# Patient Record
Sex: Female | Born: 1945 | Race: White | Hispanic: No | Smoking: Never smoker
Health system: Southern US, Community
[De-identification: ages and names within clinical notes are randomized; demographics above are authoritative.]

## PROBLEM LIST (undated history)

## (undated) DIAGNOSIS — I4891 Unspecified atrial fibrillation: Secondary | ICD-10-CM

## (undated) DIAGNOSIS — E785 Hyperlipidemia, unspecified: Secondary | ICD-10-CM

## (undated) DIAGNOSIS — I509 Heart failure, unspecified: Secondary | ICD-10-CM

## (undated) DIAGNOSIS — F419 Anxiety disorder, unspecified: Secondary | ICD-10-CM

## (undated) DIAGNOSIS — F1090 Alcohol use, unspecified, uncomplicated: Secondary | ICD-10-CM

## (undated) DIAGNOSIS — I5032 Chronic diastolic (congestive) heart failure: Secondary | ICD-10-CM

## (undated) DIAGNOSIS — K219 Gastro-esophageal reflux disease without esophagitis: Secondary | ICD-10-CM

## (undated) DIAGNOSIS — F32A Depression, unspecified: Secondary | ICD-10-CM

## (undated) DIAGNOSIS — D649 Anemia, unspecified: Secondary | ICD-10-CM

## (undated) DIAGNOSIS — E039 Hypothyroidism, unspecified: Secondary | ICD-10-CM

## (undated) DIAGNOSIS — N189 Chronic kidney disease, unspecified: Secondary | ICD-10-CM

## (undated) DIAGNOSIS — Z789 Other specified health status: Secondary | ICD-10-CM

## (undated) DIAGNOSIS — K7460 Unspecified cirrhosis of liver: Secondary | ICD-10-CM

## (undated) DIAGNOSIS — Z950 Presence of cardiac pacemaker: Secondary | ICD-10-CM

## (undated) DIAGNOSIS — R011 Cardiac murmur, unspecified: Secondary | ICD-10-CM

## (undated) DIAGNOSIS — D472 Monoclonal gammopathy: Secondary | ICD-10-CM

## (undated) DIAGNOSIS — E538 Deficiency of other specified B group vitamins: Secondary | ICD-10-CM

## (undated) DIAGNOSIS — R51 Headache: Secondary | ICD-10-CM

## (undated) DIAGNOSIS — D696 Thrombocytopenia, unspecified: Secondary | ICD-10-CM

## (undated) DIAGNOSIS — R519 Headache, unspecified: Secondary | ICD-10-CM

## (undated) DIAGNOSIS — Z803 Family history of malignant neoplasm of breast: Secondary | ICD-10-CM

## (undated) DIAGNOSIS — D509 Iron deficiency anemia, unspecified: Secondary | ICD-10-CM

## (undated) DIAGNOSIS — Z800 Family history of malignant neoplasm of digestive organs: Secondary | ICD-10-CM

## (undated) DIAGNOSIS — N183 Chronic kidney disease, stage 3 unspecified: Secondary | ICD-10-CM

## (undated) DIAGNOSIS — C50919 Malignant neoplasm of unspecified site of unspecified female breast: Secondary | ICD-10-CM

## (undated) DIAGNOSIS — Z807 Family history of other malignant neoplasms of lymphoid, hematopoietic and related tissues: Secondary | ICD-10-CM

## (undated) DIAGNOSIS — IMO0001 Reserved for inherently not codable concepts without codable children: Secondary | ICD-10-CM

## (undated) DIAGNOSIS — Z7289 Other problems related to lifestyle: Secondary | ICD-10-CM

## (undated) DIAGNOSIS — F0390 Unspecified dementia without behavioral disturbance: Secondary | ICD-10-CM

## (undated) DIAGNOSIS — J449 Chronic obstructive pulmonary disease, unspecified: Secondary | ICD-10-CM

## (undated) DIAGNOSIS — I10 Essential (primary) hypertension: Secondary | ICD-10-CM

## (undated) DIAGNOSIS — Z923 Personal history of irradiation: Secondary | ICD-10-CM

## (undated) DIAGNOSIS — E559 Vitamin D deficiency, unspecified: Secondary | ICD-10-CM

## (undated) DIAGNOSIS — F329 Major depressive disorder, single episode, unspecified: Secondary | ICD-10-CM

## (undated) HISTORY — DX: Unspecified dementia, unspecified severity, without behavioral disturbance, psychotic disturbance, mood disturbance, and anxiety: F03.90

## (undated) HISTORY — DX: Family history of malignant neoplasm of breast: Z80.3

## (undated) HISTORY — PX: CARDIAC VALVE REPLACEMENT: SHX585

## (undated) HISTORY — PX: PACEMAKER PLACEMENT: SHX43

## (undated) HISTORY — DX: Other specified health status: Z78.9

## (undated) HISTORY — DX: Family history of malignant neoplasm of digestive organs: Z80.0

## (undated) HISTORY — DX: Hyperlipidemia, unspecified: E78.5

## (undated) HISTORY — PX: VSD REPAIR: SHX276

## (undated) HISTORY — PX: MASTECTOMY: SHX3

## (undated) HISTORY — DX: Monoclonal gammopathy: D47.2

## (undated) HISTORY — PX: OTHER SURGICAL HISTORY: SHX169

## (undated) HISTORY — PX: ABLATION: SHX5711

## (undated) HISTORY — DX: Family history of other malignant neoplasms of lymphoid, hematopoietic and related tissues: Z80.7

## (undated) HISTORY — DX: Other problems related to lifestyle: Z72.89

## (undated) HISTORY — PX: BREAST SURGERY: SHX581

## (undated) HISTORY — DX: Malignant neoplasm of unspecified site of unspecified female breast: C50.919

## (undated) HISTORY — PX: CARDIAC SURGERY: SHX584

## (undated) HISTORY — DX: Iron deficiency anemia, unspecified: D50.9

## (undated) HISTORY — DX: Essential (primary) hypertension: I10

## (undated) HISTORY — PX: MITRAL VALVE REPLACEMENT: SHX147

## (undated) HISTORY — DX: Unspecified atrial fibrillation: I48.91

## (undated) HISTORY — PX: ABDOMINAL HYSTERECTOMY: SHX81

## (undated) HISTORY — DX: Alcohol use, unspecified, uncomplicated: F10.90

## (undated) HISTORY — PX: EYE SURGERY: SHX253

## (undated) HISTORY — PX: APPENDECTOMY: SHX54

---

## 2004-08-07 ENCOUNTER — Ambulatory Visit: Payer: Self-pay | Admitting: General Practice

## 2004-08-12 ENCOUNTER — Ambulatory Visit: Payer: Self-pay | Admitting: Internal Medicine

## 2004-08-21 ENCOUNTER — Ambulatory Visit: Payer: Self-pay | Admitting: Internal Medicine

## 2004-11-11 ENCOUNTER — Ambulatory Visit: Payer: Self-pay | Admitting: Internal Medicine

## 2004-11-19 ENCOUNTER — Ambulatory Visit: Payer: Self-pay | Admitting: Internal Medicine

## 2005-02-10 ENCOUNTER — Ambulatory Visit: Payer: Self-pay | Admitting: Internal Medicine

## 2005-02-19 ENCOUNTER — Ambulatory Visit: Payer: Self-pay | Admitting: Internal Medicine

## 2005-05-12 ENCOUNTER — Ambulatory Visit: Payer: Self-pay | Admitting: Internal Medicine

## 2005-05-22 ENCOUNTER — Ambulatory Visit: Payer: Self-pay | Admitting: Internal Medicine

## 2005-08-11 ENCOUNTER — Ambulatory Visit: Payer: Self-pay | Admitting: Internal Medicine

## 2005-08-18 ENCOUNTER — Ambulatory Visit: Payer: Self-pay | Admitting: General Practice

## 2005-08-21 ENCOUNTER — Ambulatory Visit: Payer: Self-pay | Admitting: Internal Medicine

## 2005-09-21 ENCOUNTER — Ambulatory Visit: Payer: Self-pay | Admitting: Internal Medicine

## 2005-10-22 ENCOUNTER — Ambulatory Visit: Payer: Self-pay | Admitting: Internal Medicine

## 2006-01-08 ENCOUNTER — Ambulatory Visit: Payer: Self-pay | Admitting: Internal Medicine

## 2006-01-19 ENCOUNTER — Ambulatory Visit: Payer: Self-pay | Admitting: Internal Medicine

## 2006-02-19 ENCOUNTER — Ambulatory Visit: Payer: Self-pay | Admitting: Internal Medicine

## 2006-03-21 ENCOUNTER — Ambulatory Visit: Payer: Self-pay | Admitting: Internal Medicine

## 2006-05-21 ENCOUNTER — Ambulatory Visit: Payer: Self-pay | Admitting: Internal Medicine

## 2006-07-21 ENCOUNTER — Ambulatory Visit: Payer: Self-pay | Admitting: Internal Medicine

## 2006-08-31 ENCOUNTER — Ambulatory Visit: Payer: Self-pay | Admitting: General Practice

## 2006-09-29 ENCOUNTER — Ambulatory Visit: Payer: Self-pay | Admitting: Internal Medicine

## 2006-10-22 ENCOUNTER — Ambulatory Visit: Payer: Self-pay | Admitting: Internal Medicine

## 2006-11-20 ENCOUNTER — Ambulatory Visit: Payer: Self-pay | Admitting: Internal Medicine

## 2007-02-16 ENCOUNTER — Ambulatory Visit: Payer: Self-pay | Admitting: Internal Medicine

## 2007-02-20 ENCOUNTER — Ambulatory Visit: Payer: Self-pay | Admitting: Internal Medicine

## 2007-05-18 ENCOUNTER — Ambulatory Visit: Payer: Self-pay | Admitting: Internal Medicine

## 2007-05-23 ENCOUNTER — Ambulatory Visit: Payer: Self-pay | Admitting: Internal Medicine

## 2007-08-09 ENCOUNTER — Ambulatory Visit: Payer: Self-pay | Admitting: Internal Medicine

## 2007-08-22 ENCOUNTER — Ambulatory Visit: Payer: Self-pay | Admitting: Internal Medicine

## 2007-10-23 ENCOUNTER — Ambulatory Visit: Payer: Self-pay | Admitting: Internal Medicine

## 2007-11-16 ENCOUNTER — Ambulatory Visit: Payer: Self-pay | Admitting: Internal Medicine

## 2007-11-20 ENCOUNTER — Ambulatory Visit: Payer: Self-pay | Admitting: Internal Medicine

## 2007-12-05 ENCOUNTER — Ambulatory Visit: Payer: Self-pay | Admitting: Internal Medicine

## 2008-02-08 ENCOUNTER — Ambulatory Visit: Payer: Self-pay | Admitting: Internal Medicine

## 2008-02-20 ENCOUNTER — Ambulatory Visit: Payer: Self-pay | Admitting: Internal Medicine

## 2008-05-09 ENCOUNTER — Ambulatory Visit: Payer: Self-pay | Admitting: Internal Medicine

## 2008-05-22 ENCOUNTER — Ambulatory Visit: Payer: Self-pay | Admitting: Internal Medicine

## 2008-06-18 ENCOUNTER — Ambulatory Visit: Payer: Self-pay | Admitting: Internal Medicine

## 2008-08-08 ENCOUNTER — Ambulatory Visit: Payer: Self-pay | Admitting: Internal Medicine

## 2008-08-21 ENCOUNTER — Ambulatory Visit: Payer: Self-pay | Admitting: Internal Medicine

## 2008-10-22 ENCOUNTER — Ambulatory Visit: Payer: Self-pay | Admitting: Internal Medicine

## 2008-11-14 ENCOUNTER — Ambulatory Visit: Payer: Self-pay | Admitting: Internal Medicine

## 2008-11-19 ENCOUNTER — Ambulatory Visit: Payer: Self-pay | Admitting: Internal Medicine

## 2008-12-06 ENCOUNTER — Ambulatory Visit: Payer: Self-pay | Admitting: Internal Medicine

## 2009-02-06 ENCOUNTER — Ambulatory Visit: Payer: Self-pay | Admitting: Internal Medicine

## 2009-02-19 ENCOUNTER — Ambulatory Visit: Payer: Self-pay | Admitting: Internal Medicine

## 2009-05-01 ENCOUNTER — Ambulatory Visit: Payer: Self-pay | Admitting: Internal Medicine

## 2009-05-22 ENCOUNTER — Ambulatory Visit: Payer: Self-pay | Admitting: Internal Medicine

## 2009-07-31 ENCOUNTER — Ambulatory Visit: Payer: Self-pay | Admitting: Internal Medicine

## 2009-08-21 ENCOUNTER — Ambulatory Visit: Payer: Self-pay | Admitting: Internal Medicine

## 2009-09-21 ENCOUNTER — Ambulatory Visit: Payer: Self-pay | Admitting: Internal Medicine

## 2009-10-22 ENCOUNTER — Ambulatory Visit: Payer: Self-pay | Admitting: Internal Medicine

## 2009-11-13 ENCOUNTER — Ambulatory Visit: Payer: Self-pay | Admitting: Internal Medicine

## 2009-11-19 ENCOUNTER — Ambulatory Visit: Payer: Self-pay | Admitting: Internal Medicine

## 2009-12-19 ENCOUNTER — Ambulatory Visit: Payer: Self-pay | Admitting: Internal Medicine

## 2010-02-13 ENCOUNTER — Ambulatory Visit: Payer: Self-pay | Admitting: Internal Medicine

## 2010-02-19 ENCOUNTER — Ambulatory Visit: Payer: Self-pay | Admitting: Internal Medicine

## 2010-04-21 ENCOUNTER — Ambulatory Visit: Payer: Self-pay | Admitting: Internal Medicine

## 2010-05-14 ENCOUNTER — Ambulatory Visit: Payer: Self-pay | Admitting: Internal Medicine

## 2010-05-22 ENCOUNTER — Ambulatory Visit: Payer: Self-pay | Admitting: Internal Medicine

## 2010-08-13 ENCOUNTER — Ambulatory Visit: Payer: Self-pay | Admitting: Internal Medicine

## 2010-08-21 ENCOUNTER — Ambulatory Visit: Payer: Self-pay | Admitting: Internal Medicine

## 2010-09-21 HISTORY — PX: BREAST LUMPECTOMY: SHX2

## 2010-09-21 HISTORY — PX: ESOPHAGOGASTRODUODENOSCOPY: SHX1529

## 2010-09-21 HISTORY — PX: COLONOSCOPY: SHX174

## 2010-09-21 HISTORY — PX: BREAST BIOPSY: SHX20

## 2010-11-11 ENCOUNTER — Ambulatory Visit: Payer: Self-pay | Admitting: Internal Medicine

## 2010-11-20 ENCOUNTER — Ambulatory Visit: Payer: Self-pay | Admitting: Internal Medicine

## 2010-12-21 ENCOUNTER — Ambulatory Visit: Payer: Self-pay | Admitting: Internal Medicine

## 2010-12-31 ENCOUNTER — Ambulatory Visit: Payer: Self-pay | Admitting: Internal Medicine

## 2011-01-02 ENCOUNTER — Ambulatory Visit: Payer: Self-pay | Admitting: Internal Medicine

## 2011-01-15 ENCOUNTER — Ambulatory Visit: Payer: Self-pay | Admitting: Surgery

## 2011-01-20 ENCOUNTER — Ambulatory Visit: Payer: Self-pay | Admitting: Internal Medicine

## 2011-01-27 ENCOUNTER — Inpatient Hospital Stay: Payer: Self-pay | Admitting: Internal Medicine

## 2011-02-04 ENCOUNTER — Ambulatory Visit: Payer: Self-pay | Admitting: Gastroenterology

## 2011-02-06 LAB — PATHOLOGY REPORT

## 2011-02-10 ENCOUNTER — Ambulatory Visit: Payer: Self-pay | Admitting: Surgery

## 2011-02-18 ENCOUNTER — Ambulatory Visit: Payer: Self-pay | Admitting: Surgery

## 2011-02-20 ENCOUNTER — Ambulatory Visit: Payer: Self-pay | Admitting: Internal Medicine

## 2011-03-22 ENCOUNTER — Ambulatory Visit: Payer: Self-pay | Admitting: Internal Medicine

## 2011-04-16 LAB — CANCER ANTIGEN 19-9: CA 19-9: 8 U/mL (ref 0–35)

## 2011-04-16 LAB — CANCER ANTIGEN 27.29: CA 27.29: 26.5 U/mL (ref 0.0–38.6)

## 2011-04-17 LAB — CEA: CEA: 1.3 ng/mL (ref 0.0–4.7)

## 2011-04-18 LAB — CEA: CEA: 1 ng/mL (ref 0.0–4.7)

## 2011-04-20 LAB — KAPPA/LAMBDA FREE LIGHT CHAINS (ARMC)

## 2011-04-20 LAB — PROT IMMUNOELECTROPHORES(ARMC)

## 2011-04-22 ENCOUNTER — Ambulatory Visit: Payer: Self-pay | Admitting: Internal Medicine

## 2011-04-22 ENCOUNTER — Telehealth: Payer: Self-pay | Admitting: Gastroenterology

## 2011-04-22 NOTE — Telephone Encounter (Signed)
Advised the cancer center that Dr Christella Hartigan is still reviewing the info.

## 2011-04-27 ENCOUNTER — Telehealth: Payer: Self-pay

## 2011-04-27 DIAGNOSIS — R933 Abnormal findings on diagnostic imaging of other parts of digestive tract: Secondary | ICD-10-CM

## 2011-04-27 NOTE — Telephone Encounter (Addendum)
Left message on machine to call back  Need to review meds and instruct pt she needs to go to [facility] and -pre register

## 2011-04-28 ENCOUNTER — Telehealth: Payer: Self-pay | Admitting: Gastroenterology

## 2011-04-28 NOTE — Telephone Encounter (Signed)
Informed pt she needs to pre register for her procedure on 04/30/11 at 2pm. Edsel Petrin in pre registration, [phone] who called the pt and registered her. Went over pt's med list; she isn't on any blood thinners and denies being a diabetic. Meds: diltiazem, denlafaxine, vit d, metoprolol, centrum and calcium. Pt instructed on prep with times for Mag. Citrate and Fleet's enema; she was instructed on a clear liquid diet as well. Pt will call for further questions.

## 2011-04-28 NOTE — Telephone Encounter (Signed)
Instructed pt she needs to pre register for her EUS on 04/30/11. Nicki Guadalajara gave me the number of [facility] in Pre Registration, [phone], and she will call the pt and register her. Pt needs to call Nicki Guadalajara on 04/29/11 between 1-3pm to find out when to come in Thursday for her procedure.

## 2011-04-29 ENCOUNTER — Telehealth: Payer: Self-pay | Admitting: Gastroenterology

## 2011-04-29 NOTE — Telephone Encounter (Signed)
Pt asked when to take the Mag. Citrate and the enema. Pt's procedure is at 2pm tomorrow. Instructed her to take the Mag. Citrate at 7pm tonight followed by at least 3 glasses of water. She needs to use the enema tomorrow 1.5 to 2 hours prior to her procedure since she lives 30 minutes away. Reminded her to drink until noon tomorrow and to keep herself hydrated. Pt stated understanding.

## 2011-04-30 ENCOUNTER — Encounter: Payer: Self-pay | Admitting: Gastroenterology

## 2011-04-30 ENCOUNTER — Ambulatory Visit: Payer: Self-pay

## 2011-04-30 ENCOUNTER — Other Ambulatory Visit: Payer: Self-pay | Admitting: Gastroenterology

## 2011-04-30 DIAGNOSIS — R933 Abnormal findings on diagnostic imaging of other parts of digestive tract: Secondary | ICD-10-CM

## 2011-05-05 LAB — PATHOLOGY REPORT

## 2011-05-11 ENCOUNTER — Encounter: Payer: Self-pay | Admitting: Gastroenterology

## 2011-05-11 ENCOUNTER — Ambulatory Visit: Payer: Self-pay | Admitting: Internal Medicine

## 2011-05-12 ENCOUNTER — Encounter: Payer: Self-pay | Admitting: Gastroenterology

## 2011-05-12 LAB — CEA: CEA: 1 ng/mL (ref 0.0–4.7)

## 2011-05-12 LAB — CA 125: CA 125: 35.2 U/mL — ABNORMAL HIGH (ref 0.0–34.0)

## 2011-05-13 LAB — CA 125: CA 125: 33.9 U/mL (ref 0.0–34.0)

## 2011-05-23 ENCOUNTER — Ambulatory Visit: Payer: Self-pay | Admitting: Internal Medicine

## 2011-05-31 ENCOUNTER — Ambulatory Visit: Payer: Self-pay | Admitting: Family Medicine

## 2011-06-22 ENCOUNTER — Ambulatory Visit: Payer: Self-pay | Admitting: Internal Medicine

## 2011-06-24 ENCOUNTER — Ambulatory Visit: Payer: Self-pay | Admitting: Gastroenterology

## 2011-06-29 LAB — PATHOLOGY REPORT

## 2011-07-23 ENCOUNTER — Ambulatory Visit: Payer: Self-pay | Admitting: Internal Medicine

## 2011-08-12 ENCOUNTER — Ambulatory Visit: Payer: Self-pay | Admitting: Surgery

## 2011-08-22 ENCOUNTER — Ambulatory Visit: Payer: Self-pay | Admitting: Internal Medicine

## 2011-09-22 ENCOUNTER — Ambulatory Visit: Payer: Self-pay | Admitting: Internal Medicine

## 2011-09-29 LAB — CBC CANCER CENTER
Basophil #: 0.2 x10 3/mm — ABNORMAL HIGH (ref 0.0–0.1)
Basophil %: 1.2 %
Eosinophil #: 0.1 x10 3/mm (ref 0.0–0.7)
HCT: 38.7 % (ref 35.0–47.0)
HGB: 13.1 g/dL (ref 12.0–16.0)
Lymphocyte #: 1 x10 3/mm (ref 1.0–3.6)
Lymphocyte %: 5.8 %
MCHC: 33.8 g/dL (ref 32.0–36.0)
MCV: 91.7 fL (ref 80–100)
Monocyte #: 0.9 x10 3/mm — ABNORMAL HIGH (ref 0.0–0.7)
Neutrophil #: 14.2 x10 3/mm — ABNORMAL HIGH (ref 1.4–6.5)
Platelet: 497 x10 3/mm — ABNORMAL HIGH (ref 150–440)
RBC: 4.22 10*6/uL (ref 3.80–5.20)
RDW: 16.3 % — ABNORMAL HIGH (ref 11.5–14.5)
WBC: 16.4 x10 3/mm — ABNORMAL HIGH (ref 3.6–11.0)

## 2011-09-29 LAB — HEPATIC FUNCTION PANEL A (ARMC)
Albumin: 3.4 g/dL (ref 3.4–5.0)
Alkaline Phosphatase: 164 U/L — ABNORMAL HIGH (ref 50–136)
Bilirubin, Direct: 0.1 mg/dL (ref 0.00–0.20)
Bilirubin,Total: 0.4 mg/dL (ref 0.2–1.0)
SGOT(AST): 29 U/L (ref 15–37)

## 2011-09-29 LAB — CREATININE, SERUM
Creatinine: 1.47 mg/dL — ABNORMAL HIGH (ref 0.60–1.30)
EGFR (African American): 46 — ABNORMAL LOW

## 2011-10-23 ENCOUNTER — Ambulatory Visit: Payer: Self-pay | Admitting: Internal Medicine

## 2011-10-27 LAB — CBC CANCER CENTER
Basophil #: 0.2 x10 3/mm — ABNORMAL HIGH (ref 0.0–0.1)
Basophil %: 1.3 %
Eosinophil #: 0.2 x10 3/mm (ref 0.0–0.7)
Eosinophil %: 1.5 %
HCT: 38.6 % (ref 35.0–47.0)
HGB: 12.5 g/dL (ref 12.0–16.0)
Lymphocyte #: 1.5 x10 3/mm (ref 1.0–3.6)
MCH: 29.8 pg (ref 26.0–34.0)
MCV: 92.3 fL (ref 80–100)
Monocyte #: 1.1 x10 3/mm — ABNORMAL HIGH (ref 0.0–0.7)
Monocyte %: 7.7 %
Neutrophil #: 11.4 x10 3/mm — ABNORMAL HIGH (ref 1.4–6.5)
RBC: 4.18 10*6/uL (ref 3.80–5.20)
WBC: 14.4 x10 3/mm — ABNORMAL HIGH (ref 3.6–11.0)

## 2011-10-27 LAB — CREATININE, SERUM
Creatinine: 1.47 mg/dL — ABNORMAL HIGH (ref 0.60–1.30)
EGFR (Non-African Amer.): 38 — ABNORMAL LOW

## 2011-10-27 LAB — HEPATIC FUNCTION PANEL A (ARMC)
Albumin: 3.4 g/dL (ref 3.4–5.0)
Alkaline Phosphatase: 196 U/L — ABNORMAL HIGH (ref 50–136)
Bilirubin, Direct: 0.1 mg/dL (ref 0.00–0.20)
Bilirubin,Total: 0.4 mg/dL (ref 0.2–1.0)
SGPT (ALT): 41 U/L

## 2011-11-05 LAB — HEPATIC FUNCTION PANEL A (ARMC)
Albumin: 3.2 g/dL — ABNORMAL LOW (ref 3.4–5.0)
Bilirubin, Direct: 0.2 mg/dL (ref 0.00–0.20)
Bilirubin,Total: 0.4 mg/dL (ref 0.2–1.0)
SGPT (ALT): 43 U/L
Total Protein: 7.8 g/dL (ref 6.4–8.2)

## 2011-11-17 LAB — CBC CANCER CENTER
Basophil #: 0.3 x10 3/mm — ABNORMAL HIGH (ref 0.0–0.1)
Eosinophil %: 1.6 %
HGB: 12.3 g/dL (ref 12.0–16.0)
Lymphocyte #: 1.2 x10 3/mm (ref 1.0–3.6)
Lymphocyte %: 8.8 %
MCHC: 33.2 g/dL (ref 32.0–36.0)
Monocyte %: 9.1 %
Neutrophil #: 10.4 x10 3/mm — ABNORMAL HIGH (ref 1.4–6.5)
Neutrophil %: 78.2 %
Platelet: 554 x10 3/mm — ABNORMAL HIGH (ref 150–440)

## 2011-11-17 LAB — HEPATIC FUNCTION PANEL A (ARMC)
SGOT(AST): 53 U/L — ABNORMAL HIGH (ref 15–37)
Total Protein: 7.8 g/dL (ref 6.4–8.2)

## 2011-11-20 ENCOUNTER — Ambulatory Visit: Payer: Self-pay | Admitting: Internal Medicine

## 2011-11-23 LAB — HEPATIC FUNCTION PANEL A (ARMC)
Albumin: 3.2 g/dL — ABNORMAL LOW (ref 3.4–5.0)
Alkaline Phosphatase: 331 U/L — ABNORMAL HIGH (ref 50–136)
Bilirubin, Direct: 0.2 mg/dL (ref 0.00–0.20)
Bilirubin,Total: 0.4 mg/dL (ref 0.2–1.0)
SGOT(AST): 54 U/L — ABNORMAL HIGH (ref 15–37)
SGPT (ALT): 88 U/L — ABNORMAL HIGH
Total Protein: 7.9 g/dL (ref 6.4–8.2)

## 2011-11-23 LAB — CBC CANCER CENTER
Basophil %: 2.8 %
Eosinophil #: 0.2 x10 3/mm (ref 0.0–0.7)
Eosinophil %: 1.5 %
HCT: 36.1 % (ref 35.0–47.0)
HGB: 11.8 g/dL — ABNORMAL LOW (ref 12.0–16.0)
Lymphocyte #: 1.2 x10 3/mm (ref 1.0–3.6)
MCH: 29.7 pg (ref 26.0–34.0)
MCHC: 32.8 g/dL (ref 32.0–36.0)
MCV: 90.6 fL (ref 80–100)
Monocyte #: 1.3 x10 3/mm — ABNORMAL HIGH (ref 0.0–0.7)
Monocyte %: 8.2 %
Neutrophil %: 80 %
RBC: 3.99 10*6/uL (ref 3.80–5.20)
WBC: 15.5 x10 3/mm — ABNORMAL HIGH (ref 3.6–11.0)

## 2011-12-01 LAB — CBC CANCER CENTER
Basophil #: 0.1 x10 3/mm (ref 0.0–0.1)
Eosinophil %: 2.1 %
HCT: 34.9 % — ABNORMAL LOW (ref 35.0–47.0)
MCH: 29.1 pg (ref 26.0–34.0)
Monocyte %: 5.9 %
Neutrophil %: 81.3 %
Platelet: 668 x10 3/mm — ABNORMAL HIGH (ref 150–440)
RBC: 3.87 10*6/uL (ref 3.80–5.20)
WBC: 14 x10 3/mm — ABNORMAL HIGH (ref 3.6–11.0)

## 2011-12-01 LAB — CREATININE, SERUM: EGFR (African American): 38 — ABNORMAL LOW

## 2011-12-01 LAB — HEPATIC FUNCTION PANEL A (ARMC)
Albumin: 3.2 g/dL — ABNORMAL LOW (ref 3.4–5.0)
Alkaline Phosphatase: 324 U/L — ABNORMAL HIGH (ref 50–136)
Bilirubin, Direct: 0.1 mg/dL (ref 0.00–0.20)
SGOT(AST): 45 U/L — ABNORMAL HIGH (ref 15–37)
SGPT (ALT): 75 U/L
Total Protein: 7.9 g/dL (ref 6.4–8.2)

## 2011-12-15 LAB — CBC CANCER CENTER
Basophil #: 0.3 x10 3/mm — ABNORMAL HIGH (ref 0.0–0.1)
Basophil %: 1.6 %
Eosinophil %: 1.9 %
HCT: 35.9 % (ref 35.0–47.0)
HGB: 11.8 g/dL — ABNORMAL LOW (ref 12.0–16.0)
Lymphocyte #: 1.2 x10 3/mm (ref 1.0–3.6)
Lymphocyte %: 6.4 %
MCH: 29.4 pg (ref 26.0–34.0)
MCHC: 32.8 g/dL (ref 32.0–36.0)
Monocyte #: 1.1 x10 3/mm — ABNORMAL HIGH (ref 0.0–0.7)
Neutrophil #: 15.5 x10 3/mm — ABNORMAL HIGH (ref 1.4–6.5)
Neutrophil %: 84 %
Platelet: 550 x10 3/mm — ABNORMAL HIGH (ref 150–440)
RBC: 4.01 10*6/uL (ref 3.80–5.20)
RDW: 15.7 % — ABNORMAL HIGH (ref 11.5–14.5)

## 2011-12-15 LAB — CREATININE, SERUM: Creatinine: 1.34 mg/dL — ABNORMAL HIGH (ref 0.60–1.30)

## 2011-12-15 LAB — HEPATIC FUNCTION PANEL A (ARMC)
Alkaline Phosphatase: 247 U/L — ABNORMAL HIGH (ref 50–136)
Bilirubin, Direct: 0.2 mg/dL (ref 0.00–0.20)
Bilirubin,Total: 0.5 mg/dL (ref 0.2–1.0)
SGPT (ALT): 126 U/L — ABNORMAL HIGH

## 2011-12-21 ENCOUNTER — Ambulatory Visit: Payer: Self-pay | Admitting: Internal Medicine

## 2011-12-29 LAB — HEPATIC FUNCTION PANEL A (ARMC)
Alkaline Phosphatase: 274 U/L — ABNORMAL HIGH (ref 50–136)
Bilirubin,Total: 0.6 mg/dL (ref 0.2–1.0)
SGOT(AST): 31 U/L (ref 15–37)
SGPT (ALT): 45 U/L
Total Protein: 8 g/dL (ref 6.4–8.2)

## 2011-12-29 LAB — CREATININE, SERUM: EGFR (Non-African Amer.): 36 — ABNORMAL LOW

## 2011-12-29 LAB — CBC CANCER CENTER
Basophil %: 1.2 %
Eosinophil #: 0.1 x10 3/mm (ref 0.0–0.7)
HCT: 34.9 % — ABNORMAL LOW (ref 35.0–47.0)
MCHC: 32.6 g/dL (ref 32.0–36.0)
Monocyte #: 0.5 x10 3/mm (ref 0.0–0.7)
Monocyte %: 3 %
RDW: 16.4 % — ABNORMAL HIGH (ref 11.5–14.5)
WBC: 16.3 x10 3/mm — ABNORMAL HIGH (ref 3.6–11.0)

## 2012-01-12 LAB — CBC CANCER CENTER
Basophil #: 0.2 x10 3/mm — ABNORMAL HIGH (ref 0.0–0.1)
Eosinophil #: 0.1 x10 3/mm (ref 0.0–0.7)
Eosinophil %: 0.6 %
HCT: 34.6 % — ABNORMAL LOW (ref 35.0–47.0)
HGB: 11.3 g/dL — ABNORMAL LOW (ref 12.0–16.0)
Lymphocyte #: 0.9 x10 3/mm — ABNORMAL LOW (ref 1.0–3.6)
Lymphocyte %: 6 %
MCHC: 32.6 g/dL (ref 32.0–36.0)
MCV: 91 fL (ref 80–100)
Monocyte #: 0.6 x10 3/mm (ref 0.2–0.9)
Monocyte %: 3.6 %
Neutrophil #: 13.7 x10 3/mm — ABNORMAL HIGH (ref 1.4–6.5)
Platelet: 516 x10 3/mm — ABNORMAL HIGH (ref 150–440)
RBC: 3.79 10*6/uL — ABNORMAL LOW (ref 3.80–5.20)
WBC: 15.4 x10 3/mm — ABNORMAL HIGH (ref 3.6–11.0)

## 2012-01-12 LAB — HEPATIC FUNCTION PANEL A (ARMC)
Albumin: 3.4 g/dL (ref 3.4–5.0)
Alkaline Phosphatase: 230 U/L — ABNORMAL HIGH (ref 50–136)
Bilirubin, Direct: 0.1 mg/dL (ref 0.00–0.20)
Bilirubin,Total: 0.5 mg/dL (ref 0.2–1.0)
SGPT (ALT): 42 U/L
Total Protein: 7.8 g/dL (ref 6.4–8.2)

## 2012-01-20 ENCOUNTER — Ambulatory Visit: Payer: Self-pay | Admitting: Internal Medicine

## 2012-02-03 LAB — CBC CANCER CENTER
Eosinophil %: 0.6 %
HCT: 36.6 % (ref 35.0–47.0)
HGB: 11.8 g/dL — ABNORMAL LOW (ref 12.0–16.0)
Lymphocyte #: 0.6 x10 3/mm — ABNORMAL LOW (ref 1.0–3.6)
MCHC: 32.2 g/dL (ref 32.0–36.0)
Monocyte %: 6.3 %
Neutrophil %: 87.6 %
RDW: 19.7 % — ABNORMAL HIGH (ref 11.5–14.5)

## 2012-02-20 ENCOUNTER — Ambulatory Visit: Payer: Self-pay | Admitting: Internal Medicine

## 2012-02-23 LAB — HEPATIC FUNCTION PANEL A (ARMC)
Alkaline Phosphatase: 133 U/L (ref 50–136)
Bilirubin, Direct: <0.1 mg/dL (ref 0.00–0.20)
Bilirubin,Total: 0.3 mg/dL (ref 0.2–1.0)
Total Protein: 8 g/dL (ref 6.4–8.2)

## 2012-02-23 LAB — CBC CANCER CENTER
Eosinophil %: 0.3 %
HGB: 12.6 g/dL (ref 12.0–16.0)
Lymphocyte %: 5.3 %
MCHC: 32.5 g/dL (ref 32.0–36.0)
Platelet: 397 x10 3/mm (ref 150–440)
RBC: 4.02 10*6/uL (ref 3.80–5.20)
RDW: 19.2 % — ABNORMAL HIGH (ref 11.5–14.5)

## 2012-02-23 LAB — CREATININE, SERUM
Creatinine: 1.54 mg/dL — ABNORMAL HIGH (ref 0.60–1.30)
EGFR (African American): 40 — ABNORMAL LOW
EGFR (Non-African Amer.): 35 — ABNORMAL LOW

## 2012-03-21 ENCOUNTER — Ambulatory Visit: Payer: Self-pay | Admitting: Internal Medicine

## 2012-03-22 ENCOUNTER — Ambulatory Visit: Payer: Self-pay | Admitting: Oncology

## 2012-03-22 LAB — CBC CANCER CENTER
Basophil %: 0.5 %
Eosinophil %: 1.1 %
HCT: 37.2 % (ref 35.0–47.0)
MCV: 95 fL (ref 80–100)
Monocyte #: 0.9 x10 3/mm (ref 0.2–0.9)
Monocyte %: 8 %
Neutrophil #: 9.5 x10 3/mm — ABNORMAL HIGH (ref 1.4–6.5)
RBC: 3.93 10*6/uL (ref 3.80–5.20)
WBC: 11.7 x10 3/mm — ABNORMAL HIGH (ref 3.6–11.0)

## 2012-04-21 ENCOUNTER — Ambulatory Visit: Payer: Self-pay | Admitting: Oncology

## 2012-04-21 ENCOUNTER — Ambulatory Visit: Payer: Self-pay | Admitting: Internal Medicine

## 2012-05-17 LAB — HEPATIC FUNCTION PANEL A (ARMC)
Bilirubin,Total: 0.4 mg/dL (ref 0.2–1.0)
SGOT(AST): 40 U/L — ABNORMAL HIGH (ref 15–37)
SGPT (ALT): 54 U/L (ref 12–78)
Total Protein: 8 g/dL (ref 6.4–8.2)

## 2012-05-17 LAB — CBC CANCER CENTER
Basophil %: 0.7 %
Eosinophil %: 2.3 %
HCT: 35.7 % (ref 35.0–47.0)
Lymphocyte %: 15.3 %
MCV: 95 fL (ref 80–100)
Monocyte #: 0.7 x10 3/mm (ref 0.2–0.9)
Monocyte %: 8 %
Neutrophil %: 73.7 %
RBC: 3.76 10*6/uL — ABNORMAL LOW (ref 3.80–5.20)
WBC: 8.2 x10 3/mm (ref 3.6–11.0)

## 2012-05-22 ENCOUNTER — Ambulatory Visit: Payer: Self-pay | Admitting: Internal Medicine

## 2012-06-14 LAB — CBC WITH DIFFERENTIAL/PLATELET
Basophil #: 0.1 10*3/uL (ref 0.0–0.1)
Basophil %: 1 %
Eosinophil #: 0.2 10*3/uL (ref 0.0–0.7)
Eosinophil %: 2.4 %
HGB: 12.1 g/dL (ref 12.0–16.0)
Lymphocyte #: 1.5 10*3/uL (ref 1.0–3.6)
MCH: 31.8 pg (ref 26.0–34.0)
MCHC: 34 g/dL (ref 32.0–36.0)
MCV: 94 fL (ref 80–100)
Monocyte #: 0.8 x10 3/mm (ref 0.2–0.9)
Monocyte %: 9 %
Neutrophil %: 71 %
Platelet: 466 10*3/uL — ABNORMAL HIGH (ref 150–440)
RBC: 3.82 10*6/uL (ref 3.80–5.20)
WBC: 9.1 10*3/uL (ref 3.6–11.0)

## 2012-06-14 LAB — HEPATIC FUNCTION PANEL A (ARMC)
Albumin: 3.4 g/dL (ref 3.4–5.0)
Bilirubin, Direct: 0.1 mg/dL (ref 0.00–0.20)
SGOT(AST): 44 U/L — ABNORMAL HIGH (ref 15–37)
SGPT (ALT): 55 U/L (ref 12–78)
Total Protein: 8.4 g/dL — ABNORMAL HIGH (ref 6.4–8.2)

## 2012-06-14 LAB — CREATININE, SERUM
Creatinine: 1.44 mg/dL — ABNORMAL HIGH (ref 0.60–1.30)
EGFR (African American): 43 — ABNORMAL LOW
EGFR (Non-African Amer.): 37 — ABNORMAL LOW

## 2012-06-21 ENCOUNTER — Ambulatory Visit: Payer: Self-pay | Admitting: Internal Medicine

## 2012-07-12 LAB — CBC CANCER CENTER
Basophil #: 0.1 x10 3/mm (ref 0.0–0.1)
Eosinophil #: 0.2 x10 3/mm (ref 0.0–0.7)
Lymphocyte #: 1.2 x10 3/mm (ref 1.0–3.6)
Lymphocyte %: 14.3 %
MCV: 93 fL (ref 80–100)
Monocyte %: 7 %
Neutrophil %: 75.7 %
Platelet: 398 x10 3/mm (ref 150–440)
RDW: 15 % — ABNORMAL HIGH (ref 11.5–14.5)
WBC: 8.4 x10 3/mm (ref 3.6–11.0)

## 2012-07-12 LAB — CREATININE, SERUM
Creatinine: 1.33 mg/dL — ABNORMAL HIGH (ref 0.60–1.30)
EGFR (Non-African Amer.): 41 — ABNORMAL LOW

## 2012-07-12 LAB — HEPATIC FUNCTION PANEL A (ARMC)
Albumin: 3.5 g/dL (ref 3.4–5.0)
Alkaline Phosphatase: 254 U/L — ABNORMAL HIGH (ref 50–136)
Bilirubin, Direct: 0.1 mg/dL (ref 0.00–0.20)
Bilirubin,Total: 0.5 mg/dL (ref 0.2–1.0)
SGOT(AST): 29 U/L (ref 15–37)
Total Protein: 8.5 g/dL — ABNORMAL HIGH (ref 6.4–8.2)

## 2012-07-22 ENCOUNTER — Ambulatory Visit: Payer: Self-pay | Admitting: Internal Medicine

## 2012-08-09 LAB — CBC CANCER CENTER
Basophil #: 0.1 x10 3/mm (ref 0.0–0.1)
HGB: 11.3 g/dL — ABNORMAL LOW (ref 12.0–16.0)
Lymphocyte #: 1.2 x10 3/mm (ref 1.0–3.6)
Lymphocyte %: 13.6 %
MCH: 32.2 pg (ref 26.0–34.0)
MCHC: 34.1 g/dL (ref 32.0–36.0)
Monocyte %: 7.5 %
Neutrophil %: 76.5 %
Platelet: 363 x10 3/mm (ref 150–440)
RDW: 14.9 % — ABNORMAL HIGH (ref 11.5–14.5)

## 2012-08-21 ENCOUNTER — Ambulatory Visit: Payer: Self-pay | Admitting: Internal Medicine

## 2012-09-06 LAB — CBC CANCER CENTER
Basophil #: 0.1 x10 3/mm (ref 0.0–0.1)
Basophil %: 1.3 %
Eosinophil #: 0.1 x10 3/mm (ref 0.0–0.7)
HCT: 33.7 % — ABNORMAL LOW (ref 35.0–47.0)
HGB: 11.6 g/dL — ABNORMAL LOW (ref 12.0–16.0)
Lymphocyte %: 23.6 %
MCH: 32.5 pg (ref 26.0–34.0)
MCHC: 34.3 g/dL (ref 32.0–36.0)
Monocyte %: 11.3 %
Neutrophil %: 61.6 %
Platelet: 394 x10 3/mm (ref 150–440)
RBC: 3.56 10*6/uL — ABNORMAL LOW (ref 3.80–5.20)
WBC: 6.8 x10 3/mm (ref 3.6–11.0)

## 2012-09-06 LAB — HEPATIC FUNCTION PANEL A (ARMC): SGOT(AST): 28 U/L (ref 15–37)

## 2012-09-21 ENCOUNTER — Ambulatory Visit: Payer: Self-pay | Admitting: Internal Medicine

## 2012-11-01 ENCOUNTER — Ambulatory Visit: Payer: Self-pay | Admitting: Internal Medicine

## 2012-11-01 LAB — HEPATIC FUNCTION PANEL A (ARMC)
Albumin: 3.6 g/dL (ref 3.4–5.0)
Alkaline Phosphatase: 276 U/L — ABNORMAL HIGH (ref 50–136)
Bilirubin, Direct: 0.1 mg/dL (ref 0.00–0.20)
SGOT(AST): 45 U/L — ABNORMAL HIGH (ref 15–37)
SGPT (ALT): 67 U/L (ref 12–78)
Total Protein: 8.1 g/dL (ref 6.4–8.2)

## 2012-11-01 LAB — CBC CANCER CENTER
Basophil #: 0.1 x10 3/mm (ref 0.0–0.1)
Basophil %: 0.9 %
Eosinophil #: 0.1 x10 3/mm (ref 0.0–0.7)
Eosinophil %: 1.9 %
HCT: 36.3 % (ref 35.0–47.0)
MCHC: 33.9 g/dL (ref 32.0–36.0)
Monocyte #: 0.4 x10 3/mm (ref 0.2–0.9)
Monocyte %: 6.8 %
Neutrophil %: 72.4 %
Platelet: 355 x10 3/mm (ref 150–440)

## 2012-11-01 LAB — CREATININE, SERUM: Creatinine: 1.44 mg/dL — ABNORMAL HIGH (ref 0.60–1.30)

## 2012-11-03 LAB — PROT IMMUNOELECTROPHORES(ARMC)

## 2012-11-19 ENCOUNTER — Ambulatory Visit: Payer: Self-pay | Admitting: Internal Medicine

## 2012-12-27 ENCOUNTER — Ambulatory Visit: Payer: Self-pay | Admitting: Internal Medicine

## 2012-12-27 LAB — CREATININE, SERUM
Creatinine: 1.38 mg/dL — ABNORMAL HIGH (ref 0.60–1.30)
EGFR (African American): 46 — ABNORMAL LOW
EGFR (Non-African Amer.): 39 — ABNORMAL LOW

## 2012-12-27 LAB — CBC CANCER CENTER
Basophil #: 0.1 x10 3/mm (ref 0.0–0.1)
Basophil %: 1 %
HGB: 12 g/dL (ref 12.0–16.0)
Lymphocyte #: 1.7 x10 3/mm (ref 1.0–3.6)
Lymphocyte %: 22.5 %
MCH: 31.4 pg (ref 26.0–34.0)
MCHC: 33.5 g/dL (ref 32.0–36.0)
MCV: 94 fL (ref 80–100)
Monocyte %: 6 %
Neutrophil #: 5 x10 3/mm (ref 1.4–6.5)
Neutrophil %: 67.6 %
Platelet: 374 x10 3/mm (ref 150–440)
RBC: 3.82 10*6/uL (ref 3.80–5.20)
RDW: 13.6 % (ref 11.5–14.5)
WBC: 7.4 x10 3/mm (ref 3.6–11.0)

## 2012-12-27 LAB — HEPATIC FUNCTION PANEL A (ARMC)
Albumin: 3.6 g/dL (ref 3.4–5.0)
Bilirubin, Direct: 0.1 mg/dL (ref 0.00–0.20)
Bilirubin,Total: 0.3 mg/dL (ref 0.2–1.0)
SGPT (ALT): 36 U/L (ref 12–78)

## 2012-12-28 LAB — KAPPA/LAMBDA FREE LIGHT CHAINS (ARMC)

## 2013-01-19 ENCOUNTER — Ambulatory Visit: Payer: Self-pay | Admitting: Internal Medicine

## 2013-02-19 ENCOUNTER — Ambulatory Visit: Payer: Self-pay | Admitting: Internal Medicine

## 2013-02-21 LAB — HEPATIC FUNCTION PANEL A (ARMC)
Albumin: 3.7 g/dL (ref 3.4–5.0)
Alkaline Phosphatase: 154 U/L — ABNORMAL HIGH (ref 50–136)
Bilirubin, Direct: 0.1 mg/dL (ref 0.00–0.20)
SGPT (ALT): 35 U/L (ref 12–78)

## 2013-02-21 LAB — CBC CANCER CENTER
Basophil #: 0 x10 3/mm (ref 0.0–0.1)
Basophil %: 0 %
HCT: 35.9 % (ref 35.0–47.0)
Lymphocyte %: 9.6 %
MCV: 97 fL (ref 80–100)
Monocyte %: 6.5 %
RBC: 3.72 10*6/uL — ABNORMAL LOW (ref 3.80–5.20)
WBC: 10.9 x10 3/mm (ref 3.6–11.0)

## 2013-02-21 LAB — CREATININE, SERUM: Creatinine: 1.39 mg/dL — ABNORMAL HIGH (ref 0.60–1.30)

## 2013-03-20 ENCOUNTER — Inpatient Hospital Stay: Payer: Self-pay | Admitting: Internal Medicine

## 2013-03-20 LAB — COMPREHENSIVE METABOLIC PANEL
Alkaline Phosphatase: 145 U/L — ABNORMAL HIGH (ref 50–136)
Bilirubin,Total: 0.5 mg/dL (ref 0.2–1.0)
Chloride: 98 mmol/L (ref 98–107)
Co2: 25 mmol/L (ref 21–32)
EGFR (Non-African Amer.): 27 — ABNORMAL LOW
Glucose: 155 mg/dL — ABNORMAL HIGH (ref 65–99)
Osmolality: 274 (ref 275–301)
Potassium: 3.5 mmol/L (ref 3.5–5.1)
Sodium: 133 mmol/L — ABNORMAL LOW (ref 136–145)
Total Protein: 7.9 g/dL (ref 6.4–8.2)

## 2013-03-20 LAB — CBC
MCH: 33.3 pg (ref 26.0–34.0)
MCHC: 34.1 g/dL (ref 32.0–36.0)
MCV: 98 fL (ref 80–100)
Platelet: 386 10*3/uL (ref 150–440)
RDW: 14.8 % — ABNORMAL HIGH (ref 11.5–14.5)

## 2013-03-20 LAB — URINALYSIS, COMPLETE
Glucose,UR: NEGATIVE mg/dL (ref 0–75)
Nitrite: NEGATIVE
Protein: 100
Specific Gravity: 1.024 (ref 1.003–1.030)
WBC UR: 4 /HPF (ref 0–5)

## 2013-03-20 LAB — HEMOGLOBIN: HGB: 12.2 g/dL (ref 12.0–16.0)

## 2013-03-20 LAB — PROTIME-INR: INR: 0.8

## 2013-03-21 ENCOUNTER — Ambulatory Visit: Payer: Self-pay | Admitting: Internal Medicine

## 2013-03-21 LAB — BASIC METABOLIC PANEL
Anion Gap: 7 (ref 7–16)
BUN: 15 mg/dL (ref 7–18)
Calcium, Total: 8.6 mg/dL (ref 8.5–10.1)
Chloride: 104 mmol/L (ref 98–107)
Co2: 25 mmol/L (ref 21–32)
Creatinine: 1.18 mg/dL (ref 0.60–1.30)
EGFR (African American): 55 — ABNORMAL LOW
Glucose: 138 mg/dL — ABNORMAL HIGH (ref 65–99)
Osmolality: 275 (ref 275–301)
Sodium: 136 mmol/L (ref 136–145)

## 2013-03-21 LAB — CLOSTRIDIUM DIFFICILE BY PCR

## 2013-03-21 LAB — CBC WITH DIFFERENTIAL/PLATELET
Basophil #: 0.1 10*3/uL (ref 0.0–0.1)
Basophil %: 0.2 %
Eosinophil #: 0 10*3/uL (ref 0.0–0.7)
HCT: 36.1 % (ref 35.0–47.0)
HGB: 12.5 g/dL (ref 12.0–16.0)
Lymphocyte %: 6.9 %
MCH: 33.9 pg (ref 26.0–34.0)
MCHC: 34.5 g/dL (ref 32.0–36.0)
Monocyte #: 1.3 x10 3/mm — ABNORMAL HIGH (ref 0.2–0.9)
Monocyte %: 6.3 %
Neutrophil #: 18.4 10*3/uL — ABNORMAL HIGH (ref 1.4–6.5)
Neutrophil %: 86.5 %
RDW: 14.9 % — ABNORMAL HIGH (ref 11.5–14.5)
WBC: 21.3 10*3/uL — ABNORMAL HIGH (ref 3.6–11.0)

## 2013-03-21 LAB — LIPID PANEL: HDL Cholesterol: 102 mg/dL — ABNORMAL HIGH (ref 40–60)

## 2013-03-22 LAB — BASIC METABOLIC PANEL
BUN: 8 mg/dL (ref 7–18)
Calcium, Total: 8.7 mg/dL (ref 8.5–10.1)
Chloride: 106 mmol/L (ref 98–107)
Creatinine: 1.02 mg/dL (ref 0.60–1.30)
EGFR (African American): >60
Sodium: 141 mmol/L (ref 136–145)

## 2013-03-22 LAB — CBC WITH DIFFERENTIAL/PLATELET
Basophil %: 0.6 %
Eosinophil %: 0.9 %
Lymphocyte %: 13.9 %
MCH: 33.3 pg (ref 26.0–34.0)
MCHC: 33.9 g/dL (ref 32.0–36.0)
Monocyte #: 0.9 x10 3/mm (ref 0.2–0.9)
Neutrophil %: 78.9 %
Platelet: 287 10*3/uL (ref 150–440)
RBC: 3.42 10*6/uL — ABNORMAL LOW (ref 3.80–5.20)
RDW: 14.7 % — ABNORMAL HIGH (ref 11.5–14.5)
WBC: 16.4 10*3/uL — ABNORMAL HIGH (ref 3.6–11.0)

## 2013-03-22 LAB — POTASSIUM: Potassium: 4 mmol/L (ref 3.5–5.1)

## 2013-03-22 LAB — MAGNESIUM
Magnesium: 1.5 mg/dL — ABNORMAL LOW
Magnesium: 2 mg/dL

## 2013-03-23 LAB — MAGNESIUM: Magnesium: 1.6 mg/dL — ABNORMAL LOW

## 2013-03-23 LAB — STOOL CULTURE

## 2013-03-23 LAB — BASIC METABOLIC PANEL
Anion Gap: 7 (ref 7–16)
Calcium, Total: 8.7 mg/dL (ref 8.5–10.1)
Co2: 29 mmol/L (ref 21–32)
Glucose: 104 mg/dL — ABNORMAL HIGH (ref 65–99)
Osmolality: 279 (ref 275–301)
Potassium: 3 mmol/L — ABNORMAL LOW (ref 3.5–5.1)
Sodium: 140 mmol/L (ref 136–145)

## 2013-04-18 LAB — CBC CANCER CENTER
Eosinophil %: 2.9 %
HCT: 38.5 % (ref 35.0–47.0)
HGB: 13.1 g/dL (ref 12.0–16.0)
MCH: 32.9 pg (ref 26.0–34.0)
MCV: 97 fL (ref 80–100)
Monocyte #: 0.6 x10 3/mm (ref 0.2–0.9)
Monocyte %: 6.7 %
Neutrophil %: 71.9 %
Platelet: 341 x10 3/mm (ref 150–440)
WBC: 9.7 x10 3/mm (ref 3.6–11.0)

## 2013-04-18 LAB — HEPATIC FUNCTION PANEL A (ARMC)
Albumin: 3.8 g/dL (ref 3.4–5.0)
Bilirubin,Total: 0.4 mg/dL (ref 0.2–1.0)
Total Protein: 7.9 g/dL (ref 6.4–8.2)

## 2013-04-18 LAB — CREATININE, SERUM
Creatinine: 1.41 mg/dL — ABNORMAL HIGH (ref 0.60–1.30)
EGFR (African American): 45 — ABNORMAL LOW

## 2013-04-19 LAB — KAPPA/LAMBDA FREE LIGHT CHAINS (ARMC)

## 2013-04-21 ENCOUNTER — Ambulatory Visit: Payer: Self-pay | Admitting: Internal Medicine

## 2013-06-09 ENCOUNTER — Inpatient Hospital Stay: Payer: Self-pay | Admitting: Internal Medicine

## 2013-06-09 LAB — URINALYSIS, COMPLETE
Nitrite: NEGATIVE
Protein: NEGATIVE
RBC,UR: <1 /HPF (ref 0–5)
Specific Gravity: 1.004 (ref 1.003–1.030)
Squamous Epithelial: NONE SEEN
WBC UR: 1 /HPF (ref 0–5)

## 2013-06-09 LAB — COMPREHENSIVE METABOLIC PANEL
Albumin: 4 g/dL (ref 3.4–5.0)
Alkaline Phosphatase: 130 U/L (ref 50–136)
Anion Gap: 12 (ref 7–16)
Calcium, Total: 10.2 mg/dL — ABNORMAL HIGH (ref 8.5–10.1)
Co2: 26 mmol/L (ref 21–32)
Creatinine: 1.66 mg/dL — ABNORMAL HIGH (ref 0.60–1.30)
EGFR (Non-African Amer.): 31 — ABNORMAL LOW
Osmolality: 262 (ref 275–301)
SGOT(AST): 31 U/L (ref 15–37)
SGPT (ALT): 24 U/L (ref 12–78)

## 2013-06-09 LAB — CBC
HGB: 14 g/dL (ref 12.0–16.0)
MCV: 95 fL (ref 80–100)
RBC: 4.24 10*6/uL (ref 3.80–5.20)
RDW: 14.7 % — ABNORMAL HIGH (ref 11.5–14.5)
WBC: 11.9 10*3/uL — ABNORMAL HIGH (ref 3.6–11.0)

## 2013-06-09 LAB — TROPONIN I: Troponin-I: <0.02 ng/mL

## 2013-06-09 LAB — PROTIME-INR: Prothrombin Time: 12.1 secs (ref 11.5–14.7)

## 2013-06-09 LAB — HEMOGLOBIN A1C: Hemoglobin A1C: 5.9 % (ref 4.2–6.3)

## 2013-06-10 LAB — CK TOTAL AND CKMB (NOT AT ARMC)
CK, Total: 62 U/L (ref 21–215)
CK-MB: <0.5 ng/mL — ABNORMAL LOW (ref 0.5–3.6)

## 2013-06-10 LAB — BASIC METABOLIC PANEL
Anion Gap: 8 (ref 7–16)
Calcium, Total: 9.3 mg/dL (ref 8.5–10.1)
Chloride: 96 mmol/L — ABNORMAL LOW (ref 98–107)
Co2: 29 mmol/L (ref 21–32)
EGFR (African American): 45 — ABNORMAL LOW
EGFR (Non-African Amer.): 39 — ABNORMAL LOW
Glucose: 92 mg/dL (ref 65–99)
Osmolality: 269 (ref 275–301)
Potassium: 3.1 mmol/L — ABNORMAL LOW (ref 3.5–5.1)

## 2013-06-10 LAB — MAGNESIUM: Magnesium: 1.9 mg/dL

## 2013-06-10 LAB — CBC WITH DIFFERENTIAL/PLATELET
Basophil #: 0.1 10*3/uL (ref 0.0–0.1)
HCT: 32.8 % — ABNORMAL LOW (ref 35.0–47.0)
HGB: 11.4 g/dL — ABNORMAL LOW (ref 12.0–16.0)
Lymphocyte #: 1.7 10*3/uL (ref 1.0–3.6)
Lymphocyte %: 21.9 %
MCH: 33.2 pg (ref 26.0–34.0)
Neutrophil #: 5.3 10*3/uL (ref 1.4–6.5)
Neutrophil %: 68 %
RDW: 14.9 % — ABNORMAL HIGH (ref 11.5–14.5)

## 2013-06-10 LAB — TROPONIN I
Troponin-I: <0.02 ng/mL
Troponin-I: <0.02 ng/mL

## 2013-08-19 ENCOUNTER — Ambulatory Visit: Payer: Self-pay | Admitting: Physician Assistant

## 2013-08-19 ENCOUNTER — Inpatient Hospital Stay: Payer: Self-pay | Admitting: Internal Medicine

## 2013-08-19 LAB — TROPONIN I
Troponin-I: <0.02 ng/mL
Troponin-I: <0.02 ng/mL
Troponin-I: <0.02 ng/mL

## 2013-08-19 LAB — CBC
HCT: 39.2 % (ref 35.0–47.0)
HGB: 13.8 g/dL (ref 12.0–16.0)
MCH: 35.3 pg — ABNORMAL HIGH (ref 26.0–34.0)
MCHC: 35.2 g/dL (ref 32.0–36.0)
Platelet: 422 10*3/uL (ref 150–440)
WBC: 10 10*3/uL (ref 3.6–11.0)

## 2013-08-19 LAB — BASIC METABOLIC PANEL
Anion Gap: 8 (ref 7–16)
Calcium, Total: 10.2 mg/dL — ABNORMAL HIGH (ref 8.5–10.1)
Chloride: 101 mmol/L (ref 98–107)
EGFR (Non-African Amer.): 42 — ABNORMAL LOW
Glucose: 106 mg/dL — ABNORMAL HIGH (ref 65–99)

## 2013-08-19 LAB — TSH: Thyroid Stimulating Horm: 2.26 u[IU]/mL

## 2013-08-19 LAB — PROTIME-INR
INR: 0.8
Prothrombin Time: 11.7 secs (ref 11.5–14.7)

## 2013-08-20 LAB — CBC WITH DIFFERENTIAL/PLATELET
Basophil %: 1.2 %
Eosinophil #: 0.2 10*3/uL (ref 0.0–0.7)
Lymphocyte #: 1.3 10*3/uL (ref 1.0–3.6)
MCH: 36.3 pg — ABNORMAL HIGH (ref 26.0–34.0)
Monocyte #: 0.5 x10 3/mm (ref 0.2–0.9)
Monocyte %: 9 %
Neutrophil #: 3.7 10*3/uL (ref 1.4–6.5)
Platelet: 355 10*3/uL (ref 150–440)
RBC: 3.51 10*6/uL — ABNORMAL LOW (ref 3.80–5.20)
RDW: 14.5 % (ref 11.5–14.5)
WBC: 5.8 10*3/uL (ref 3.6–11.0)

## 2013-08-20 LAB — BASIC METABOLIC PANEL
Calcium, Total: 9.3 mg/dL (ref 8.5–10.1)
Chloride: 101 mmol/L (ref 98–107)
EGFR (African American): 53 — ABNORMAL LOW
Glucose: 89 mg/dL (ref 65–99)
Osmolality: 277 (ref 275–301)
Potassium: 3.3 mmol/L — ABNORMAL LOW (ref 3.5–5.1)
Sodium: 138 mmol/L (ref 136–145)

## 2013-08-20 LAB — LIPID PANEL
Cholesterol: 276 mg/dL — ABNORMAL HIGH (ref 0–200)
HDL Cholesterol: 81 mg/dL — ABNORMAL HIGH (ref 40–60)
Ldl Cholesterol, Calc: 171 mg/dL — ABNORMAL HIGH (ref 0–100)

## 2013-10-17 ENCOUNTER — Ambulatory Visit: Payer: Self-pay | Admitting: Internal Medicine

## 2013-10-17 LAB — HEPATIC FUNCTION PANEL A (ARMC)
ALBUMIN: 3.7 g/dL (ref 3.4–5.0)
ALT: 37 U/L (ref 12–78)
Alkaline Phosphatase: 123 U/L — ABNORMAL HIGH
Bilirubin, Direct: 0.1 mg/dL (ref 0.00–0.20)
Bilirubin,Total: 0.3 mg/dL (ref 0.2–1.0)
SGOT(AST): 25 U/L (ref 15–37)
Total Protein: 7.8 g/dL (ref 6.4–8.2)

## 2013-10-17 LAB — CBC CANCER CENTER
BASOS ABS: 0.1 x10 3/mm (ref 0.0–0.1)
BASOS PCT: 0.6 %
EOS ABS: 0.2 x10 3/mm (ref 0.0–0.7)
EOS PCT: 1.6 %
HCT: 40.6 % (ref 35.0–47.0)
HGB: 13.3 g/dL (ref 12.0–16.0)
LYMPHS ABS: 1.5 x10 3/mm (ref 1.0–3.6)
Lymphocyte %: 13.3 %
MCH: 32.7 pg (ref 26.0–34.0)
MCHC: 32.7 g/dL (ref 32.0–36.0)
MCV: 100 fL (ref 80–100)
MONO ABS: 0.8 x10 3/mm (ref 0.2–0.9)
Monocyte %: 6.6 %
NEUTROS ABS: 9 x10 3/mm — AB (ref 1.4–6.5)
Neutrophil %: 77.9 %
Platelet: 416 x10 3/mm (ref 150–440)
RBC: 4.07 10*6/uL (ref 3.80–5.20)
RDW: 14.6 % — AB (ref 11.5–14.5)
WBC: 11.6 x10 3/mm — AB (ref 3.6–11.0)

## 2013-10-17 LAB — CREATININE, SERUM
Creatinine: 1.56 mg/dL — ABNORMAL HIGH (ref 0.60–1.30)
EGFR (African American): 39 — ABNORMAL LOW
EGFR (Non-African Amer.): 34 — ABNORMAL LOW

## 2013-10-19 LAB — KAPPA/LAMBDA FREE LIGHT CHAINS (ARMC)

## 2013-10-19 LAB — PROT IMMUNOELECTROPHORES(ARMC)

## 2013-10-22 ENCOUNTER — Ambulatory Visit: Payer: Self-pay | Admitting: Internal Medicine

## 2013-11-02 ENCOUNTER — Inpatient Hospital Stay: Payer: Self-pay | Admitting: Internal Medicine

## 2013-11-02 LAB — COMPREHENSIVE METABOLIC PANEL
ALK PHOS: 144 U/L — AB
ANION GAP: 11 (ref 7–16)
Albumin: 3.7 g/dL (ref 3.4–5.0)
BUN: 19 mg/dL — AB (ref 7–18)
Bilirubin,Total: 0.3 mg/dL (ref 0.2–1.0)
CALCIUM: 9.2 mg/dL (ref 8.5–10.1)
CHLORIDE: 88 mmol/L — AB (ref 98–107)
Co2: 24 mmol/L (ref 21–32)
Creatinine: 1.22 mg/dL (ref 0.60–1.30)
EGFR (Non-African Amer.): 45 — ABNORMAL LOW
GFR CALC AF AMER: 53 — AB
GLUCOSE: 103 mg/dL — AB (ref 65–99)
Osmolality: 250 (ref 275–301)
Potassium: 4.1 mmol/L (ref 3.5–5.1)
SGOT(AST): 38 U/L — ABNORMAL HIGH (ref 15–37)
SGPT (ALT): 47 U/L (ref 12–78)
SODIUM: 123 mmol/L — AB (ref 136–145)
TOTAL PROTEIN: 8.5 g/dL — AB (ref 6.4–8.2)

## 2013-11-02 LAB — CBC
HCT: 39 % (ref 35.0–47.0)
HGB: 13.2 g/dL (ref 12.0–16.0)
MCH: 33.4 pg (ref 26.0–34.0)
MCHC: 33.8 g/dL (ref 32.0–36.0)
MCV: 99 fL (ref 80–100)
Platelet: 422 10*3/uL (ref 150–440)
RBC: 3.95 10*6/uL (ref 3.80–5.20)
RDW: 14.9 % — ABNORMAL HIGH (ref 11.5–14.5)
WBC: 10.3 10*3/uL (ref 3.6–11.0)

## 2013-11-02 LAB — PRO B NATRIURETIC PEPTIDE: B-TYPE NATIURETIC PEPTID: 1466 pg/mL — AB (ref 0–125)

## 2013-11-02 LAB — PROTIME-INR
INR: 1.5
Prothrombin Time: 18.1 secs — ABNORMAL HIGH (ref 11.5–14.7)

## 2013-11-02 LAB — TROPONIN I: Troponin-I: <0.02 ng/mL

## 2013-11-03 LAB — BASIC METABOLIC PANEL
Anion Gap: 10 (ref 7–16)
BUN: 16 mg/dL (ref 7–18)
CHLORIDE: 93 mmol/L — AB (ref 98–107)
CO2: 23 mmol/L (ref 21–32)
CREATININE: 1.16 mg/dL (ref 0.60–1.30)
Calcium, Total: 8.8 mg/dL (ref 8.5–10.1)
GFR CALC AF AMER: 56 — AB
GFR CALC NON AF AMER: 48 — AB
Glucose: 151 mg/dL — ABNORMAL HIGH (ref 65–99)
Osmolality: 257 (ref 275–301)
POTASSIUM: 3.7 mmol/L (ref 3.5–5.1)
Sodium: 126 mmol/L — ABNORMAL LOW (ref 136–145)

## 2013-11-03 LAB — CBC WITH DIFFERENTIAL/PLATELET
BASOS PCT: 0.2 %
Basophil #: 0 10*3/uL (ref 0.0–0.1)
EOS ABS: 0 10*3/uL (ref 0.0–0.7)
Eosinophil %: 0.1 %
HCT: 33.5 % — AB (ref 35.0–47.0)
HGB: 11.7 g/dL — ABNORMAL LOW (ref 12.0–16.0)
Lymphocyte #: 0.3 10*3/uL — ABNORMAL LOW (ref 1.0–3.6)
Lymphocyte %: 3 %
MCH: 34.2 pg — AB (ref 26.0–34.0)
MCHC: 34.8 g/dL (ref 32.0–36.0)
MCV: 98 fL (ref 80–100)
MONOS PCT: 1.2 %
Monocyte #: 0.1 x10 3/mm — ABNORMAL LOW (ref 0.2–0.9)
Neutrophil #: 8.6 10*3/uL — ABNORMAL HIGH (ref 1.4–6.5)
Neutrophil %: 95.5 %
PLATELETS: 316 10*3/uL (ref 150–440)
RBC: 3.41 10*6/uL — AB (ref 3.80–5.20)
RDW: 14.9 % — ABNORMAL HIGH (ref 11.5–14.5)
WBC: 9 10*3/uL (ref 3.6–11.0)

## 2013-11-04 LAB — BASIC METABOLIC PANEL
ANION GAP: 7 (ref 7–16)
BUN: 20 mg/dL — ABNORMAL HIGH (ref 7–18)
CHLORIDE: 102 mmol/L (ref 98–107)
CO2: 28 mmol/L (ref 21–32)
Calcium, Total: 9.6 mg/dL (ref 8.5–10.1)
Creatinine: 1.26 mg/dL (ref 0.60–1.30)
EGFR (African American): 51 — ABNORMAL LOW
GFR CALC NON AF AMER: 44 — AB
GLUCOSE: 120 mg/dL — AB (ref 65–99)
OSMOLALITY: 278 (ref 275–301)
Potassium: 3.9 mmol/L (ref 3.5–5.1)
Sodium: 137 mmol/L (ref 136–145)

## 2013-11-04 LAB — PLATELET COUNT: Platelet: 381 10*3/uL (ref 150–440)

## 2013-11-07 LAB — CULTURE, BLOOD (SINGLE)

## 2013-11-27 ENCOUNTER — Other Ambulatory Visit (HOSPITAL_COMMUNITY): Payer: Self-pay | Admitting: Internal Medicine

## 2013-12-26 ENCOUNTER — Ambulatory Visit: Payer: Self-pay | Admitting: Internal Medicine

## 2013-12-26 LAB — HEPATIC FUNCTION PANEL A (ARMC)
ALBUMIN: 3.6 g/dL (ref 3.4–5.0)
Alkaline Phosphatase: 87 U/L
Bilirubin, Direct: 0.1 mg/dL (ref 0.00–0.20)
Bilirubin,Total: 0.4 mg/dL (ref 0.2–1.0)
SGOT(AST): 20 U/L (ref 15–37)
SGPT (ALT): 29 U/L (ref 12–78)
Total Protein: 7.3 g/dL (ref 6.4–8.2)

## 2013-12-26 LAB — CBC CANCER CENTER
BASOS ABS: 0.1 x10 3/mm (ref 0.0–0.1)
BASOS PCT: 1 %
Eosinophil #: 0.2 x10 3/mm (ref 0.0–0.7)
Eosinophil %: 1.6 %
HCT: 35.7 % (ref 35.0–47.0)
HGB: 11.7 g/dL — ABNORMAL LOW (ref 12.0–16.0)
LYMPHS ABS: 1.4 x10 3/mm (ref 1.0–3.6)
LYMPHS PCT: 12.5 %
MCH: 33.6 pg (ref 26.0–34.0)
MCHC: 32.7 g/dL (ref 32.0–36.0)
MCV: 103 fL — AB (ref 80–100)
Monocyte #: 0.9 x10 3/mm (ref 0.2–0.9)
Monocyte %: 8.3 %
Neutrophil #: 8.5 x10 3/mm — ABNORMAL HIGH (ref 1.4–6.5)
Neutrophil %: 76.6 %
PLATELETS: 331 x10 3/mm (ref 150–440)
RBC: 3.47 10*6/uL — ABNORMAL LOW (ref 3.80–5.20)
RDW: 15.9 % — ABNORMAL HIGH (ref 11.5–14.5)
WBC: 11.1 x10 3/mm — AB (ref 3.6–11.0)

## 2013-12-26 LAB — CREATININE, SERUM
CREATININE: 1.42 mg/dL — AB (ref 0.60–1.30)
EGFR (Non-African Amer.): 38 — ABNORMAL LOW
GFR CALC AF AMER: 44 — AB

## 2014-01-09 ENCOUNTER — Ambulatory Visit: Payer: Self-pay | Admitting: Gastroenterology

## 2014-01-19 ENCOUNTER — Ambulatory Visit: Payer: Self-pay | Admitting: Internal Medicine

## 2014-02-19 ENCOUNTER — Ambulatory Visit: Payer: Self-pay | Admitting: Internal Medicine

## 2014-02-19 DIAGNOSIS — E559 Vitamin D deficiency, unspecified: Secondary | ICD-10-CM | POA: Insufficient documentation

## 2014-02-19 DIAGNOSIS — K743 Primary biliary cirrhosis: Secondary | ICD-10-CM | POA: Insufficient documentation

## 2014-02-19 DIAGNOSIS — K219 Gastro-esophageal reflux disease without esophagitis: Secondary | ICD-10-CM | POA: Insufficient documentation

## 2014-02-20 LAB — CBC CANCER CENTER
BASOS PCT: 0.5 %
Basophil #: 0.1 x10 3/mm (ref 0.0–0.1)
EOS PCT: 0.7 %
Eosinophil #: 0.1 x10 3/mm (ref 0.0–0.7)
HCT: 36.8 % (ref 35.0–47.0)
HGB: 12.2 g/dL (ref 12.0–16.0)
Lymphocyte #: 0.4 x10 3/mm — ABNORMAL LOW (ref 1.0–3.6)
Lymphocyte %: 3.5 %
MCH: 33.4 pg (ref 26.0–34.0)
MCHC: 33.3 g/dL (ref 32.0–36.0)
MCV: 100 fL (ref 80–100)
MONOS PCT: 5.4 %
Monocyte #: 0.6 x10 3/mm (ref 0.2–0.9)
Neutrophil #: 10.2 x10 3/mm — ABNORMAL HIGH (ref 1.4–6.5)
Neutrophil %: 89.9 %
Platelet: 455 x10 3/mm — ABNORMAL HIGH (ref 150–440)
RBC: 3.67 10*6/uL — ABNORMAL LOW (ref 3.80–5.20)
RDW: 14.9 % — AB (ref 11.5–14.5)
WBC: 11.3 x10 3/mm — ABNORMAL HIGH (ref 3.6–11.0)

## 2014-02-20 LAB — HEPATIC FUNCTION PANEL A (ARMC)
ALK PHOS: 115 U/L
Albumin: 3.7 g/dL (ref 3.4–5.0)
Bilirubin, Direct: 0.1 mg/dL (ref 0.00–0.20)
Bilirubin,Total: 0.5 mg/dL (ref 0.2–1.0)
SGOT(AST): 26 U/L (ref 15–37)
SGPT (ALT): 35 U/L (ref 12–78)
TOTAL PROTEIN: 7.7 g/dL (ref 6.4–8.2)

## 2014-02-20 LAB — CREATININE, SERUM
Creatinine: 1.72 mg/dL — ABNORMAL HIGH (ref 0.60–1.30)
EGFR (Non-African Amer.): 30 — ABNORMAL LOW
GFR CALC AF AMER: 35 — AB

## 2014-02-23 LAB — PROT IMMUNOELECTROPHORES(ARMC)

## 2014-02-27 LAB — IRON AND TIBC
Iron Bind.Cap.(Total): 366 ug/dL (ref 250–450)
Iron Saturation: 11 %
Iron: 42 ug/dL — ABNORMAL LOW (ref 50–170)
UNBOUND IRON-BIND. CAP.: 324 ug/dL

## 2014-02-27 LAB — CREATININE, SERUM
CREATININE: 1.35 mg/dL — AB (ref 0.60–1.30)
EGFR (African American): 47 — ABNORMAL LOW
EGFR (Non-African Amer.): 40 — ABNORMAL LOW

## 2014-02-27 LAB — FERRITIN: Ferritin (ARMC): 66 ng/mL (ref 8–388)

## 2014-02-27 LAB — PLATELET COUNT: Platelet: 370 10*3/uL (ref 150–440)

## 2014-03-21 ENCOUNTER — Ambulatory Visit: Payer: Self-pay | Admitting: Internal Medicine

## 2014-03-22 DIAGNOSIS — F331 Major depressive disorder, recurrent, moderate: Secondary | ICD-10-CM | POA: Insufficient documentation

## 2014-03-22 DIAGNOSIS — J449 Chronic obstructive pulmonary disease, unspecified: Secondary | ICD-10-CM | POA: Insufficient documentation

## 2014-04-05 ENCOUNTER — Other Ambulatory Visit (HOSPITAL_COMMUNITY): Payer: Self-pay | Admitting: Internal Medicine

## 2014-04-24 ENCOUNTER — Ambulatory Visit: Payer: Self-pay | Admitting: Internal Medicine

## 2014-04-24 LAB — CBC CANCER CENTER
Basophil #: 0.1 x10 3/mm (ref 0.0–0.1)
Basophil %: 1 %
Eosinophil #: 0.1 x10 3/mm (ref 0.0–0.7)
Eosinophil %: 1.4 %
HCT: 34.4 % — ABNORMAL LOW (ref 35.0–47.0)
HGB: 11.1 g/dL — ABNORMAL LOW (ref 12.0–16.0)
LYMPHS PCT: 5.6 %
Lymphocyte #: 0.6 x10 3/mm — ABNORMAL LOW (ref 1.0–3.6)
MCH: 29.8 pg (ref 26.0–34.0)
MCHC: 32.1 g/dL (ref 32.0–36.0)
MCV: 93 fL (ref 80–100)
MONOS PCT: 6.5 %
Monocyte #: 0.7 x10 3/mm (ref 0.2–0.9)
Neutrophil #: 8.8 x10 3/mm — ABNORMAL HIGH (ref 1.4–6.5)
Neutrophil %: 85.5 %
Platelet: 443 x10 3/mm — ABNORMAL HIGH (ref 150–440)
RBC: 3.71 10*6/uL — AB (ref 3.80–5.20)
RDW: 16.4 % — ABNORMAL HIGH (ref 11.5–14.5)
WBC: 10.3 x10 3/mm (ref 3.6–11.0)

## 2014-04-24 LAB — CREATININE, SERUM
Creatinine: 1.6 mg/dL — ABNORMAL HIGH (ref 0.60–1.30)
EGFR (African American): 38 — ABNORMAL LOW
EGFR (Non-African Amer.): 33 — ABNORMAL LOW

## 2014-05-13 DIAGNOSIS — F419 Anxiety disorder, unspecified: Secondary | ICD-10-CM | POA: Insufficient documentation

## 2014-05-22 ENCOUNTER — Ambulatory Visit: Payer: Self-pay | Admitting: Internal Medicine

## 2014-06-06 DIAGNOSIS — Z7901 Long term (current) use of anticoagulants: Secondary | ICD-10-CM | POA: Insufficient documentation

## 2014-06-06 DIAGNOSIS — Z9889 Other specified postprocedural states: Secondary | ICD-10-CM | POA: Insufficient documentation

## 2014-06-21 ENCOUNTER — Ambulatory Visit: Payer: Self-pay | Admitting: Internal Medicine

## 2014-08-01 DIAGNOSIS — I340 Nonrheumatic mitral (valve) insufficiency: Secondary | ICD-10-CM | POA: Insufficient documentation

## 2014-08-01 DIAGNOSIS — I5032 Chronic diastolic (congestive) heart failure: Secondary | ICD-10-CM | POA: Insufficient documentation

## 2014-08-01 DIAGNOSIS — I5033 Acute on chronic diastolic (congestive) heart failure: Secondary | ICD-10-CM | POA: Insufficient documentation

## 2014-08-07 ENCOUNTER — Ambulatory Visit: Payer: Self-pay | Admitting: Internal Medicine

## 2014-08-15 ENCOUNTER — Ambulatory Visit: Payer: Self-pay | Admitting: Ophthalmology

## 2014-08-15 LAB — POTASSIUM: POTASSIUM: 4.4 mmol/L (ref 3.5–5.1)

## 2014-08-15 LAB — PROTIME-INR
INR: 3.9
Prothrombin Time: 36.7 secs — ABNORMAL HIGH (ref 11.5–14.7)

## 2014-08-21 IMAGING — MG MM DIGITAL DIAGNOSTIC BILAT W/ CAD
1 series · 6 of 6 positions shown · non-contrast
Comparison: none

REASON FOR EXAM: HX BRST CA
COMMENTS:

[R CC · right · 6 of 6 slices shown]
[im 1/6]
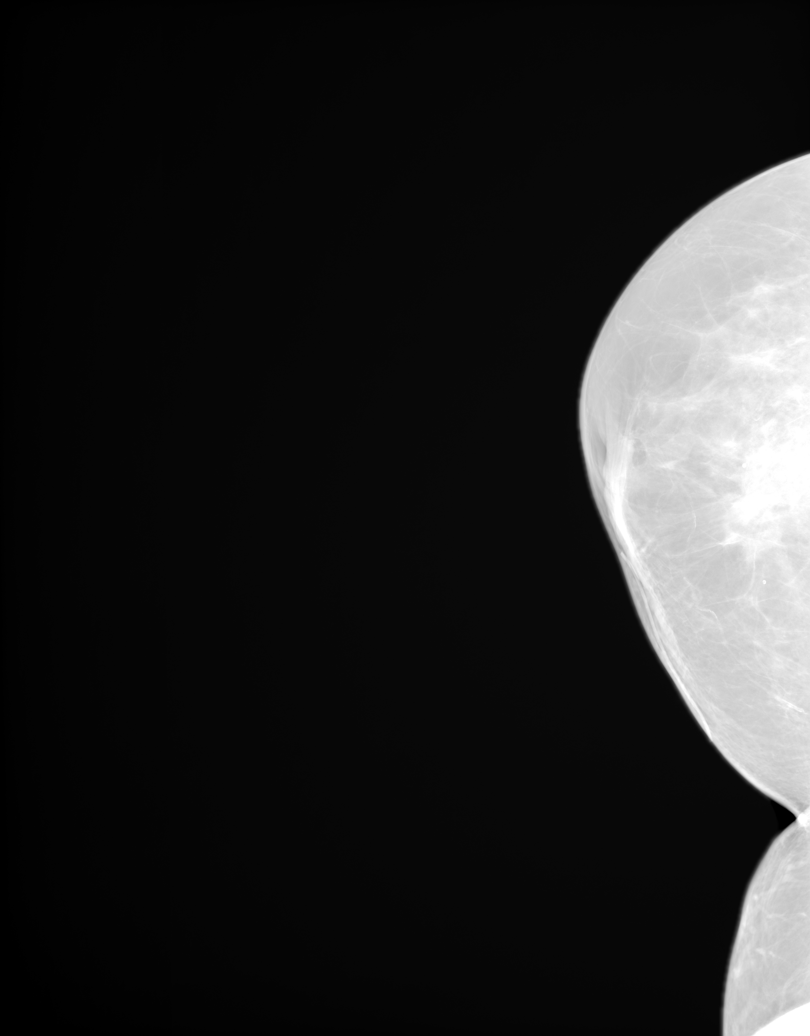
[im 2/6]
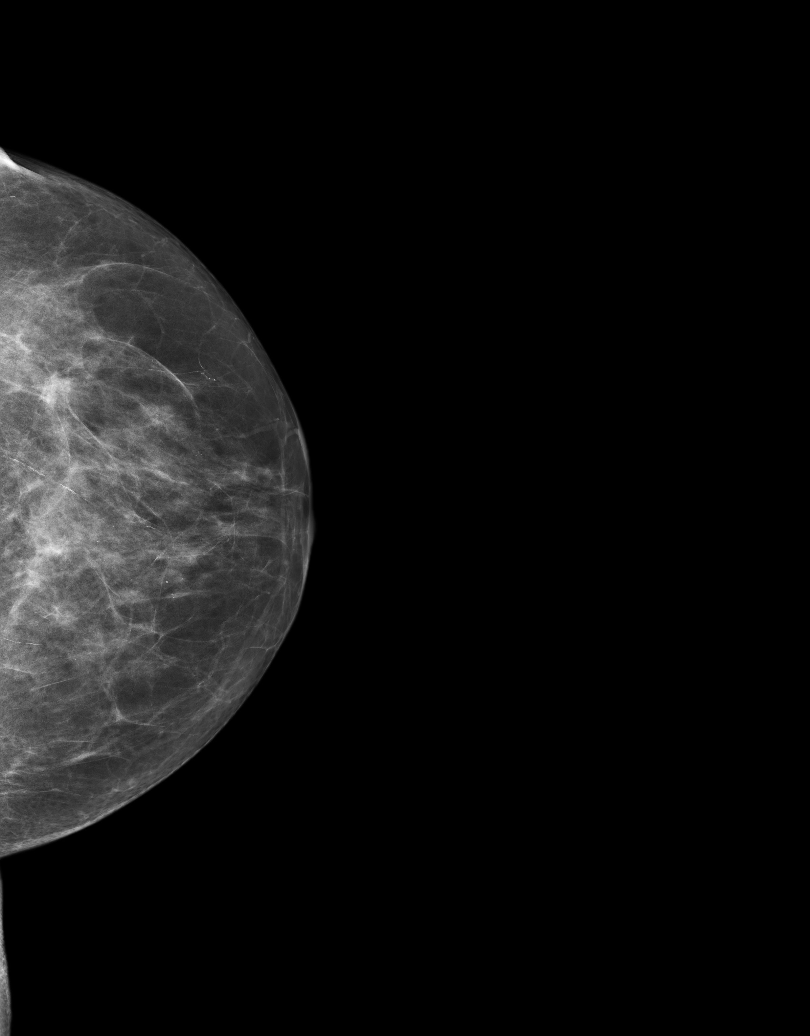
[im 3/6]
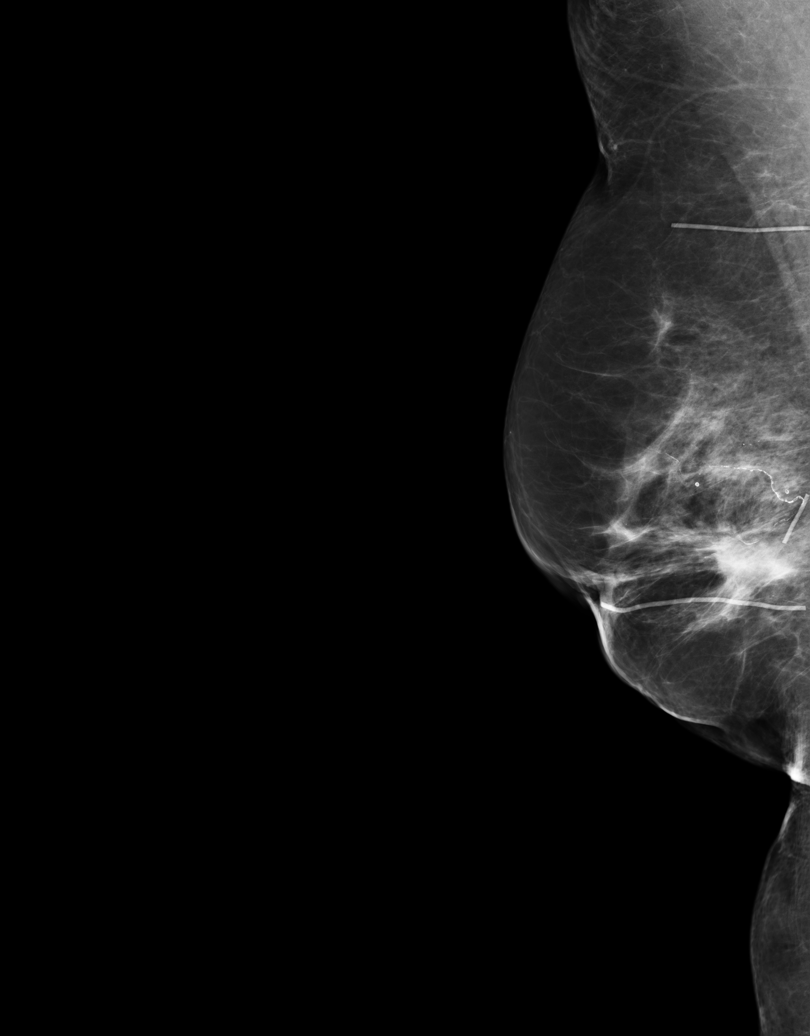
[im 4/6]
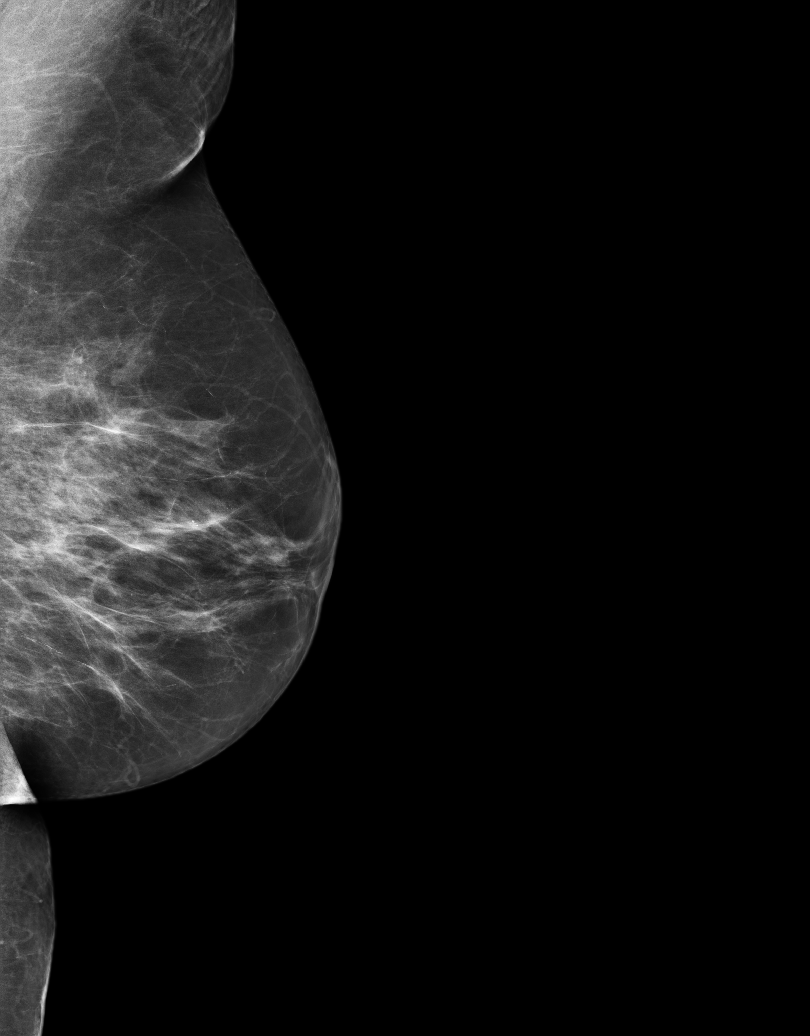
[im 5/6]
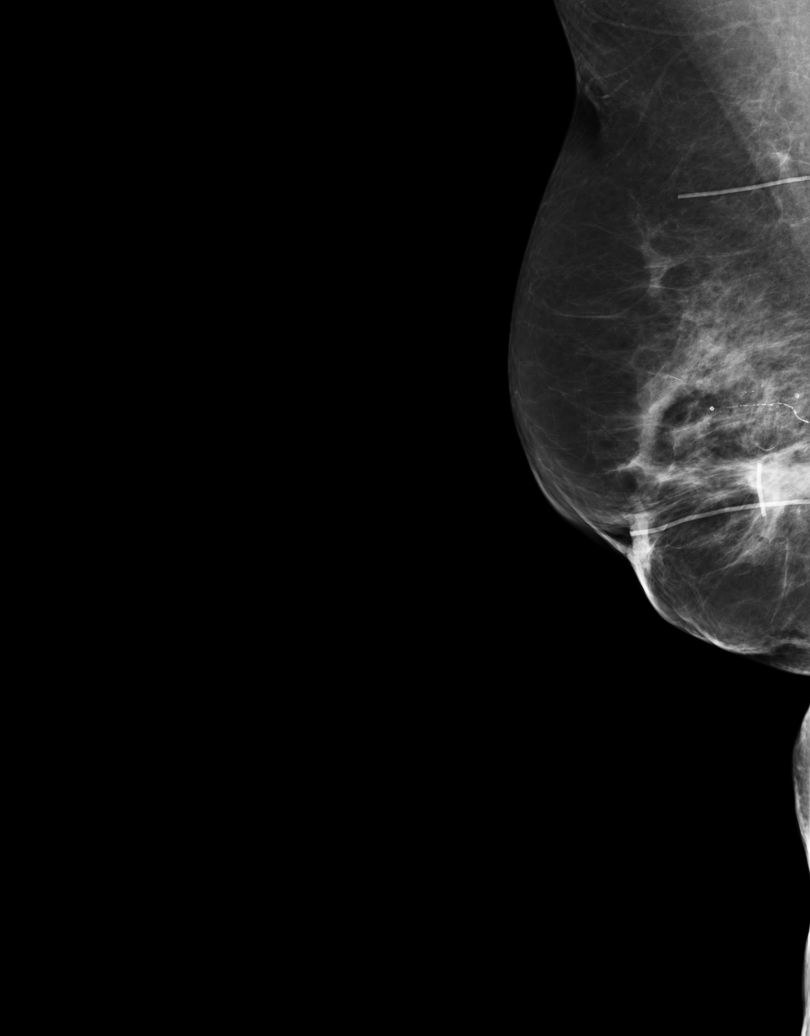
[im 6/6]
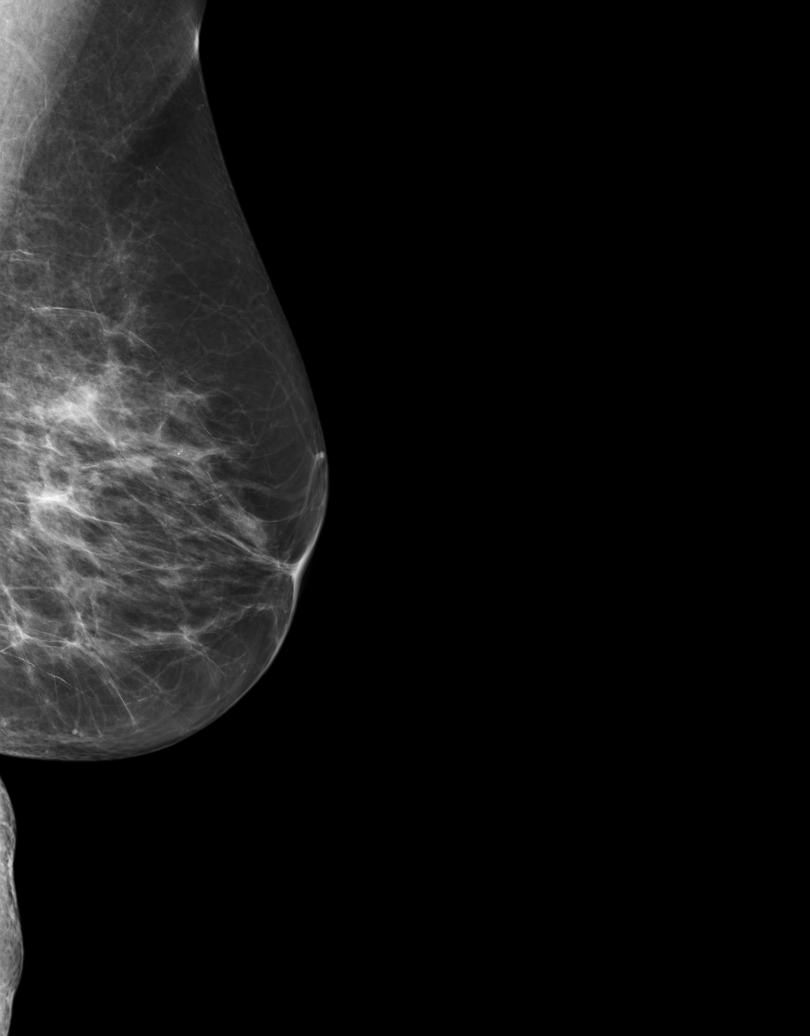

[6 of 6 positions shown; findings below may reference images not displayed]

PROCEDURE:     MMM - MMM DIG DIAG BILAT W/CAD  - March 14, 2013 [DATE]

RESULT:     Comparison is made to previous digital studies dated [DATE] [DATE],
[DATE], [DATE] [DATE], [DATE], and is December 05, 2007. The patient has a history of
breast malignancy treated with lumpectomy and radiation therapy beginning in

On the right there are postsurgical and post radiation changes. Numerous
surgical scar markers are present. An area of dominant density is present
but not dramatically changed from the study March 11, 2012 nor August 12, 2011. There are 2 coarse macrocalcifications in the midportion of the
right breast. There are no malignant appearing groupings of
microcalcification on the right nor on the left. The left breast exhibits a
scattered fibroglandular pattern.
IMPRESSION: I do not see findings suspicious for recurrent malignancy
on the right nor for malignancy on the left.

BI-RADS 2: Benign findings.

Recommendation: please continue to encourage yearly mammographic followup.

BREAST COMPOSITION: The breast composition is SCATTERED FIBROGLANDULAR
TISSUE (glandular tissue is 25-50%)

A NEGATIVE MAMMOGRAM REPORT DOES NOT PRECLUDE BIOPSY OR OTHER EVALUATION OF
A CLINICALLY PALPABLE OR OTHERWISE SUSPICIOUS MASS OR LESION. BREAST CANCER
MAY NOT BE DETECTED BY MAMMOGRAPHY IN UP TO 10% OF CASES.

Dictation site:1

## 2014-08-29 ENCOUNTER — Ambulatory Visit: Payer: Self-pay | Admitting: Internal Medicine

## 2014-08-29 LAB — PROTIME-INR
INR: 2.2
PROTHROMBIN TIME: 23.7 s — AB (ref 11.5–14.7)

## 2014-09-04 ENCOUNTER — Other Ambulatory Visit (HOSPITAL_COMMUNITY): Payer: Self-pay | Admitting: Internal Medicine

## 2014-09-25 ENCOUNTER — Ambulatory Visit: Payer: Self-pay | Admitting: Internal Medicine

## 2014-09-25 LAB — CBC CANCER CENTER
BASOS ABS: 0.1 x10 3/mm (ref 0.0–0.1)
BASOS PCT: 1 %
EOS PCT: 1.9 %
Eosinophil #: 0.2 x10 3/mm (ref 0.0–0.7)
HCT: 35.2 % (ref 35.0–47.0)
HGB: 11.3 g/dL — AB (ref 12.0–16.0)
LYMPHS PCT: 7.9 %
Lymphocyte #: 0.7 x10 3/mm — ABNORMAL LOW (ref 1.0–3.6)
MCH: 27.1 pg (ref 26.0–34.0)
MCHC: 32.1 g/dL (ref 32.0–36.0)
MCV: 84 fL (ref 80–100)
Monocyte #: 0.3 x10 3/mm (ref 0.2–0.9)
Monocyte %: 4 %
NEUTROS ABS: 7.3 x10 3/mm — AB (ref 1.4–6.5)
Neutrophil %: 85.2 %
Platelet: 412 x10 3/mm (ref 150–440)
RBC: 4.17 10*6/uL (ref 3.80–5.20)
RDW: 17.7 % — ABNORMAL HIGH (ref 11.5–14.5)
WBC: 8.5 x10 3/mm (ref 3.6–11.0)

## 2014-09-25 LAB — CREATININE, SERUM
Creatinine: 1.46 mg/dL — ABNORMAL HIGH (ref 0.60–1.30)
EGFR (African American): 46 — ABNORMAL LOW
EGFR (Non-African Amer.): 38 — ABNORMAL LOW

## 2014-10-02 LAB — OCCULT BLOOD X 1 CARD TO LAB, STOOL
OCCULT BLOOD, FECES: NEGATIVE
OCCULT BLOOD, FECES: NEGATIVE
Occult Blood, Feces: NEGATIVE

## 2014-10-10 ENCOUNTER — Ambulatory Visit: Payer: Self-pay | Admitting: Ophthalmology

## 2014-10-10 LAB — POTASSIUM: Potassium: 4.4 mmol/L (ref 3.5–5.1)

## 2014-10-14 DIAGNOSIS — Q245 Malformation of coronary vessels: Secondary | ICD-10-CM | POA: Insufficient documentation

## 2014-10-18 ENCOUNTER — Ambulatory Visit: Payer: Self-pay | Admitting: Ophthalmology

## 2014-10-22 ENCOUNTER — Ambulatory Visit: Payer: Self-pay | Admitting: Internal Medicine

## 2014-10-22 ENCOUNTER — Ambulatory Visit
Admit: 2014-10-22 | Disposition: A | Discharge status: Home / Self Care | Payer: Self-pay | Attending: Internal Medicine | Admitting: Internal Medicine

## 2014-11-06 DIAGNOSIS — Z79899 Other long term (current) drug therapy: Secondary | ICD-10-CM | POA: Insufficient documentation

## 2014-11-20 ENCOUNTER — Ambulatory Visit
Admit: 2014-11-20 | Disposition: A | Discharge status: Home / Self Care | Payer: Self-pay | Attending: Internal Medicine | Admitting: Internal Medicine

## 2014-12-04 ENCOUNTER — Other Ambulatory Visit (HOSPITAL_COMMUNITY): Payer: Self-pay | Admitting: Internal Medicine

## 2014-12-14 LAB — PROT IMMUNOELECTROPHORES(ARMC)

## 2014-12-14 LAB — KAPPA/LAMBDA FREE LIGHT CHAINS (ARMC)

## 2014-12-19 ENCOUNTER — Ambulatory Visit
Admit: 2014-12-19 | Disposition: A | Discharge status: Home / Self Care | Payer: Self-pay | Attending: Ophthalmology | Admitting: Ophthalmology

## 2014-12-21 ENCOUNTER — Ambulatory Visit
Admit: 2014-12-21 | Disposition: A | Discharge status: Home / Self Care | Payer: Self-pay | Attending: Internal Medicine | Admitting: Internal Medicine

## 2014-12-21 ENCOUNTER — Ambulatory Visit
Admit: 2014-12-21 | Disposition: A | Discharge status: Home / Self Care | Payer: Medicare Other | Attending: Internal Medicine | Admitting: Internal Medicine

## 2014-12-30 ENCOUNTER — Emergency Department
Admit: 2014-12-30 | Disposition: A | Discharge status: Home / Self Care | Payer: Self-pay | Admitting: Emergency Medicine

## 2014-12-30 LAB — COMPREHENSIVE METABOLIC PANEL
ALT: 29 U/L
AST: 31 U/L
Albumin: 4 g/dL
Alkaline Phosphatase: 101 U/L
Anion Gap: 12 (ref 7–16)
BUN: 22 mg/dL — ABNORMAL HIGH
Bilirubin,Total: 0.5 mg/dL
CALCIUM: 9.1 mg/dL
CHLORIDE: 91 mmol/L — AB
Co2: 26 mmol/L
Creatinine: 1.22 mg/dL — ABNORMAL HIGH
EGFR (African American): 52 — ABNORMAL LOW
EGFR (Non-African Amer.): 45 — ABNORMAL LOW
GLUCOSE: 103 mg/dL — AB
POTASSIUM: 4.7 mmol/L
SODIUM: 129 mmol/L — AB
Total Protein: 7.9 g/dL

## 2014-12-30 LAB — CBC
HCT: 34.4 % — ABNORMAL LOW (ref 35.0–47.0)
HGB: 11.1 g/dL — ABNORMAL LOW (ref 12.0–16.0)
MCH: 28.3 pg (ref 26.0–34.0)
MCHC: 32.3 g/dL (ref 32.0–36.0)
MCV: 88 fL (ref 80–100)
PLATELETS: 482 10*3/uL — AB (ref 150–440)
RBC: 3.92 10*6/uL (ref 3.80–5.20)
RDW: 19.2 % — ABNORMAL HIGH (ref 11.5–14.5)
WBC: 11.5 10*3/uL — AB (ref 3.6–11.0)

## 2014-12-30 LAB — PROTIME-INR
INR: 2
Prothrombin Time: 23.2 secs — ABNORMAL HIGH

## 2014-12-30 LAB — CREATININE, SERUM: CREATINE, SERUM: 1.22

## 2015-01-11 NOTE — H&P (Signed)
PATIENT NAME:  Adriana Martin, Adriana Martin MR#:  [id number] DATE OF BIRTH:  Jan 27, 1946  DATE OF ADMISSION:  08/19/2013  PRIMARY CARE PHYSICIAN: Dr. Genene Churn  CARDIOLOGIST:  Dr. Serafina Royals   CHIEF COMPLAINT:  Cough for 2 weeks. Found to have atrial fibrillation with RVR.   HISTORY OF PRESENT ILLNESS: This is a very nice 69 year old female who has history of paroxysmal atrial fibrillation, previous breast cancer, chronic thrombocytosis, primary biliary  cirrhosis, hypertension, depression. She comes in today with a history of 2 weeks of cough that this has been going on continuously, not getting better. The cough is intermittent through all day and night. She starting to develop sputum that varies in between clear to yellow and green, small to medium amounts and is starting to wheeze for the past couple of days.   The patient states she is getting really agitated when she walks and not able to catch her breath the same way that she always does. She used to smoke but she denies any history or any diagnosis of COPD. She states that she occasionally has a cough in the winter, but no pneumonia or bronchitis.   The patient states that she is not having any fevers and she did not want to consult with a doctor for which she stayed at home until her family made her go to the urgent care today. Whenever she presented to the urgent care, her heart rate was elevated in the 100s. She was found to be in atrial fibrillation.   Whenever she came back into the ER, the patient had a heart rate that went up to 150 and right now is lingering around 120 to 134. The patient states that she can feel the palpitations, but she was not noticing those earlier because of her severe cough.   The patient is admitted for evaluation and treatment of this condition.   The patient also had significant shortness of breath after IV fluids were administered for which she got diuresis with Lasix. She is at this moment, not having any  crackles, but she has significant wheezing.   REVIEW OF SYSTEMS:  A 12 system review of systems review of systems is done.  CONSTITUTIONAL: The patient denies any fever. No weight gain. No weight loss. No significant peripheral edema.  EYES: No blurry vision, double vision, or erythema of the eyes. EARS, NOSE AND THROAT:  No tinnitus. No postnasal drip. No sore throat. No difficulty swallowing.  RESPIRATORY: Positive cough. Positive wheezing. No hemoptysis. Positive occasional dyspnea whenever she exercised or gets around. No history of COPD in the past.  CARDIOVASCULAR: No chest pain. No orthopnea. Positive for palpitations. Negative for syncope. Negative for peripheral edema.   GASTROINTESTINAL: No abdominal pain, constipation, diarrhea.  GENITOURINARY: No dysuria, hematuria, changes in frequency.  ENDOCRINE: No polyuria, polydipsia or cold or heat intolerance.  HEMATOLOGIC AND LYMPHATIC: No anemia, easy bruising or bleeding.  SKIN: No rashes, petechiae, or new lesions.  MUSCULOSKELETAL: The patient has just mild osteoarthritis of the knees, but no significant pain at this moment. No gout. The patient takes Actigall and Hydrea due to thrombocytosis.  NEUROLOGIC: No numbness, tingling. No CVAs or TIAs.  PSYCHIATRIC: No significant insomnia. The patient has history of depression, but has been well controlled.   PAST MEDICAL HISTORY: 1.  Paroxysmal atrial fibrillation.  2.  Mild depression.  3.  History of primary biliary cirrhosis.  4.  Hypertension.  5.  History of thrombocytosis, chronic.  6.  Hyperlipidemia.  7.  History of breast cancer, now in remission.   ALLERGIES: The patient denies any drug allergies.   SOCIAL HISTORY: She is a former smoker. She quit about 15 years ago. She had a history of 1 pack a day for over 30 years. States that she has never been told that she had COPD, emphysema or any long complications from her smoking. Alcohol: She drinks wine occasionally, maybe once  every week or every other week. She does not use any drugs. She is the primary care giver of her mother, who is 26.   FAMILY HISTORY: Positive for multiple myeloma on her father and her mother is alive at the age of 35 in good health.   CURRENT MEDICATIONS: Check with the patient. Alprazolam 0.5 mg. The patient takes it p.r.n. Anastrozole 1 mg daily, BuSpar 15 mg 3 times a day, calcium plus vitamin D once daily, Cardizem 180 mg daily, recent increase in the dose by Dr. Nehemiah Massed, previous dose was 120, Centrum multivitamins once daily, Hydrea 500 mg once a day, 6 days a week. Hydrochlorothiazide has been stopped. Lactobacillus 1 mg twice daily, metoprolol 50 mg once daily, increased from her previous dose of 25, prednisone 5 mg daily, which is a chronic steroid dose. Actigall 300 mg 3 times a day.   PHYSICAL EXAMINATION: VITAL SIGNS: Blood pressure is 138/89, pulse in between 120 to 134, heart rate on admission 150, temperature 98.3, oxygen saturation 94% to 97% on room air.  GENERAL: The patient is alert, oriented x 3. Very pleasant, no acute distress. No respiratory distress. Hemodynamically she looks stable.  HEENT: Her pupils are equal and reactive. Extraocular movements are intact. Mucosa are moist. Anicteric sclerae. Pink conjunctivae. No oral lesions. No oropharyngeal exudates.  NECK: Supple. No JVD. No thyromegaly. No adenopathy. No carotid bruits. No rigidity.  CARDIOVASCULAR: Irregularly irregular rate tachycardic in the 130s unable to auscultate any murmurs. At this moment due to the tachycardia. No displacement of PMI. No tenderness to palpation of anterior chest wall.  LUNGS: The patient has diffuse wheezing, especially in the upper and middle lobes with decreased respiratory sounds in low lobes. The patient does not have any rales. There are no crackles at this moment. No signs of consolidation. No use of accessory muscles.  ABDOMEN: Soft, nontender, nondistended. No hepatosplenomegaly. No  masses. Bowel sounds are positive.  GENITAL: Deferred.  EXTREMITIES: There is no significant peripheral edema. No cyanosis or clubbing.  VASCULAR: Pulses +2. Capillary refill less than 3 seconds.  NEUROLOGIC: Cranial nerves II through XII grossly intact. Her strength seems to be equal in all 4 extremities. No focal findings.  PSYCHIATRIC: No significant agitation. The patient is alert, oriented x 3.  LYMPHATIC: Negative for lymphadenopathy in neck or supraclavicular areas.  SKIN: No rashes, petechiae. Normal turgor.  MUSCULOSKELETAL: No major obvious joint abnormalities or joint effusions.   EKG: On presentation the patient has atrial fibrillation with rapid ventricular response, positive LDH. No significant ST elevations. Heart rate on EKG is 148. As mentioned above, previous echocardiogram done on previous admission, showed an ejection fraction which is overall normal, 55% to 60%. Left ventricular systolic function was normal. Mild dilation of the left atrium.   CURRENT LABORATORY DATA:  Include a glucose of 106, BNP OF 3500,  creatinine 1.31 which is better than previous hospitalization. Her baseline though is probably around 1 back at the beginning of this year. Sodium 137, potassium 3.8. Her GFR is around 42, calcium is 10.2. Her total protein has been  elevated in the past at 8.5. We do not have a CMP today. Her troponin is 0.02. White count is 10, hemoglobin 13, platelet count 422, INR 0.8. Looking back to some old data, the patient has protein electrophoresis. This shows M spike on serum and free kappa  LT chains. 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    CHEST X-RAY: Mild basilar subsegmental atelectasis with cardiomegaly. No significant signs of CHF or pulmonary edema at that moment.   ASSESSMENT AND PLAN: A 69 year old female with history of atrial fibrillation/paroxysmal atrial fibrillation, primary biliary cirrhosis, thrombocytosis, MAGUS,  depression, history of previous breast cancer, comes today with a history of 2 weeks of cough and she found to be in atrial fibrillation with rapid ventricular rate.    1.  Atrial fibrillation with rapid ventricular response.  The patient is admitted to the Critical Care Unit as she is on a diltiazem drip. She has not responded to the diltiazem drip despite the fact that she got a bolus and her drip was going at 10.  We can increase the drip, although her blood pressure has been a little bit on the low side 100s/70's. We are going to try to titrate.  2.  As she is not responding well to Cardizem, we are going to add on bolus of digoxin at 250 mcg x 1. We can repeat 125 two times more in the next several hours, for total Digitalisation.  3.  The patient has a good ejection fraction on previous CT scan, but again, she is not responding very well to Cardizem and I do not think that she is a very good candidate for amiodarone at this moment. We could use amiodarone as a last instance if heart rate is not controlled.  4.  Cardiology consultation is going to be obtained as the patient has Dr. Nehemiah Massed as primary cardiologist. 5.  Her CHADS score is 1 for which we are going to continue to do aspirin or Aleve for cerebrovascular accident prophylaxis.  6.  The patient is stable at this moment, but she has potential to have complications due to her heart rate. She went into acute congestive heart failure, likely due to her rapid heart rate and the administration of IV fluids in the ER. The patient has been given Lasix, and actually she improved significantly.  7.  The patient is going to monitor closely.  8.  Acute congestive heart failure, likely happened after fluids was given the patient has an echocardiogram that does not show any systolic dysfunction, mostly this could be diastolic due to again diastolic dysfunction. The patient has been given some Lasix. We are going to continue Lasix every 12 hours IV x 2 just  to make sure that she does not get overloaded. This is likely congestive heart failure secondary to the fluid an exacerbation of her atrial fibrillation.  9.  Continue beta blocker and Lasix.  10.  Biliary cirrhosis. At this moment seems to be stable. Going to get LFTs in the morning.  11. Hypertension: Seems to be well controlled. Continue metoprolol for now.  12.  Depression. Also well controlled without any significant agitation for thrombocytosis. The patient is taking Hydrea. Continue her current dose. The patient has also MAGUS.  The patient has slight elevation of calcium. No significant worsening of her kidney failure although she does have elevation of her baseline since her baseline was around 1 and right now it is 1.3. Does not seem  to be in any acute kidney failure.  13.  History of breast cancer. Continue anastrozole.  14.  Chronic steroid use. The patient is currently taking prednisone 5 mg. We will continue same dose.  15.  Acute bronchitis. The patient has increased secretions, increased cough. Chest x-ray shows some atelectasis. No signs of pneumonia. We are going to start her azithromycin for treatment of this upper respiratory infection.  16.  Deep vein thrombosis prophylaxis with heparin and gastrointestinal prophylaxis with Protonix.   TIME SPENT: I spent about 50 minutes with this patient in critical care time, as she is going to the CCU on drip in correction of her acute pulmonary edema.     ____________________________ Luther Sink, MD rsg:dp D: 08/19/2013 15:12:55 ET T: 08/19/2013 16:20:56 ET JOB#: [id number]  cc: Cuthbert Sink, MD, <Dictator> Kelan Pritt America Brown MD ELECTRONICALLY SIGNED 08/31/2013 18:00

## 2015-01-11 NOTE — Discharge Summary (Signed)
PATIENT NAME:  Adriana Martin, Adriana Martin MR#:  [id number] DATE OF BIRTH:  Jul 15, 1946  DATE OF ADMISSION:  08/19/2013 DATE OF DISCHARGE:  08/20/2013  ADMISSION DIAGNOSIS:  1.  Atrial fibrillation and rapid ventricular response.  2.  Acute upper respiratory infection.   DISCHARGE DIAGNOSES: 1.  Atrial fibrillation and rapid ventricular response, with a history of atrial fibrillation, CHADS score of 1. 2.  Acute bronchitis.  3.  Tachycardia, acute pulmonary edema/acute diastolic heart failure.  4.  History of primary biliary cirrhosis.  5.  History of elevated platelets.  6.  History of breast cancer.  7.  Hypokalemia.  8.  Hyperlipidemia.   CONSULTATIONS: Dr. Nehemiah Massed from cardiology.   LABORATORIES AT DISCHARGE: White blood cells 5.8, hemoglobin 12.7, hematocrit 35.6, platelets 355, LDL 171, cholesterol 276, sodium 138, potassium 3.3, chloride 101, bicarbonate 32, BUN 18, creatinine 1.1, glucose 89. Troponins are negative.   HOSPITAL COURSE: A 69 year old female who presented with atrial fibrillation and RVR. For further details, please refer to the H and P.  1.  Atrial fibrillation and RVR. The patient was placed in the Critical Care Unit. She was placed on a diltiazem drip. She was continued on her outpatient medications. Once in the Intensive Care Unit, her diltiazem drip was slowly weaned to tolerable heart rates less than 100. Dr. Nehemiah Massed of cardiology was consulted. At this time, there was no need for anticoagulation as her CHADS score was 1. She had an echocardiogram just recently, which showed normal ejection fraction, moderate MVR. No need to add digoxin or amiodarone, as her heart rates did improve.  2.  Acute bronchitis. The patient is on azithromycin.  3.  Tachycardia-induced pulmonary edema. It appeared that, from the admission physician, that the patient had some congestive heart failure secondary to tachycardia-induced pulmonary edema, with acute on chronic diastolic heart failure,  which improved with Lasix. Her ejection fraction is normal.  4.  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 5.  History of elevated platelets. The patient was on Hydrea.  6.  History of breast cancer. The patient is on anastrozole.  7.  Hypokalemia, which was repleted.  8.  Hyperlipidemia. The patient is not on a statin medication. This can be further investigated and treated as an outpatient with her primary care physician or cardiologist.   DISCHARGE MEDICATIONS:  1.  Xanax 0.5 mg b.i.d. p.r.n., anxiety.  2.  Ursodiol 300 mg t.i.d.  3.  Prednisone 5 mg daily.  4.  Calcium 1 tablet b.i.d.  5.  Centrum Silver daily.  6.  Hydrea 500 mg 6 times a week.  7.  Buspirone 15 mg 3 times a day.  8.  Anastrozole 1 mg daily.  9.   Aspirin 325 mg daily.  10.  Azithromycin 60 mg daily for three days.    DISCHARGE DIET: Low sodium.   DISCHARGE ACTIVITY: As tolerated.   DISCHARGE FOLLOWUP: The patient will follow up with Dr. Nehemiah Massed in one week and Dr. Raechel Ache in two weeks.  TIME SPENT: Approximately 35 minutes. The patient was medically stable for discharge.   ____________________________ Amberle Lyter P. Benjie Karvonen, MD spm:cg D: 08/20/2013 19:46:37 ET T: 08/20/2013 23:23:15 ET JOB#: [id number]  cc: Naoko Diperna P. Benjie Karvonen, MD, <Dictator> Christena Flake. Raechel Ache, MD Corey Skains, MD Donell Beers Harvest Deist MD ELECTRONICALLY SIGNED 08/21/2013 12:24

## 2015-01-11 NOTE — Consult Note (Signed)
Chief Complaint:  Subjective/Chief Complaint Pt feels well.  Tolerating diet well.  Mild bloating & "gas pains".  Denies N/V.   VITAL SIGNS/ANCILLARY NOTES: **Vital Signs.:   03-Jul-14 04:13  Vital Signs Type Routine  Temperature Temperature (F) 97.6  Celsius 36.4  Temperature Source oral  Pulse Pulse 85  Respirations Respirations 20  Systolic BP Systolic BP 132  Diastolic BP (mmHg) Diastolic BP (mmHg) 71  Mean BP 103  Pulse Ox % Pulse Ox % 93  Pulse Ox Activity Level  At rest  Oxygen Delivery Room Air/ 21 %   Brief Assessment:  GEN well developed, well nourished, no acute distress, A/Ox3   Cardiac Regular   Respiratory normal resp effort   Gastrointestinal details normal Soft  Nontender  Nondistended  No masses palpable  Bowel sounds normal  No rebound tenderness  No gaurding   EXTR negative edema   Additional Physical Exam Skin: warm, dry, intact   Lab Results: Routine Chem:  03-Jul-14 05:07   Glucose, Serum  104  BUN 11  Creatinine (comp) 0.98  Sodium, Serum 140  Potassium, Serum  3.0  Chloride, Serum 104  CO2, Serum 29  Calcium (Total), Serum 8.7  Anion Gap 7  Osmolality (calc) 279  eGFR (African American) >60  eGFR (Non-African American)  60 (eGFR values <85m/min/1.73 m2 may be an indication of chronic kidney disease (CKD). Calculated eGFR is useful in patients with stable renal function. The eGFR calculation will not be reliable in acutely ill patients when serum creatinine is changing rapidly. It is not useful in  patients on dialysis. The eGFR calculation may not be applicable to patients at the low and high extremes of body sizes, pregnant women, and vegetarians.)  Magnesium, Serum  1.6 (1.8-2.4 THERAPEUTIC RANGE: 4-7 mg/dL TOXIC: > 10 mg/dL  -----------------------)   Assessment/Plan:  Assessment/Plan:  Assessment Acute colitis: Stool cx negative.  Improved. 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 OCandace Cruise    Rectal bleeding: Resolved. Hypokalemia/Hypomagnesemia: per attending   Plan 1) Begin probiotic of choice as outpatient 2) Complete cipro/flagyl [facility]) K/Mag repletion per attending Will sign off, call if any questions/concerns. Thank you.   Electronic Signatures: JAndria Meuse(NP)  (Signed 03-Jul-14 09:56)  Authored: Chief Complaint, VITAL SIGNS/ANCILLARY NOTES, Brief Assessment, Lab Results, Assessment/Plan   Last Updated: 03-Jul-14 09:56 by JAndria Meuse(NP)

## 2015-01-11 NOTE — Consult Note (Signed)
Chief Complaint:  Subjective/Chief Complaint Pt feels much better.  Tolerating diet well.  Mild abdominal cramps.  3 loose stools in 24 hrs.  Denies N/V.   VITAL SIGNS/ANCILLARY NOTES: **Vital Signs.:   02-Jul-14 04:25  Vital Signs Type Routine  Temperature Temperature (F) 98.1  Celsius 36.7  Temperature Source oral  Pulse Pulse 78  Respirations Respirations 20  Systolic BP Systolic BP 808  Diastolic BP (mmHg) Diastolic BP (mmHg) 83  Mean BP 103  Pulse Ox % Pulse Ox % 92  Pulse Ox Activity Level  At rest  Oxygen Delivery Room Air/ 21 %   Brief Assessment:  GEN well developed, well nourished, no acute distress, A/Ox3.  Daughter @ bedside.   Cardiac Regular   Respiratory normal resp effort   Gastrointestinal details normal Soft  Nontender  Nondistended  No masses palpable  Bowel sounds normal  No rebound tenderness  No gaurding   EXTR negative edema   Additional Physical Exam Skin: warm, dry, intact   Lab Results: Routine Chem:  02-Jul-14 05:45   Magnesium, Serum  1.5 (1.8-2.4 THERAPEUTIC RANGE: 4-7 mg/dL TOXIC: > 10 mg/dL  -----------------------)  Glucose, Serum 84  BUN 8  Creatinine (comp) 1.02  Sodium, Serum 141  Potassium, Serum  2.7  Chloride, Serum 106  CO2, Serum 28  Calcium (Total), Serum 8.7  Anion Gap 7  Osmolality (calc) 279  eGFR (African American) >60  eGFR (Non-African American)  57 (eGFR values <3m/min/1.73 m2 may be an indication of chronic kidney disease (CKD). Calculated eGFR is useful in patients with stable renal function. The eGFR calculation will not be reliable in acutely ill patients when serum creatinine is changing rapidly. It is not useful in  patients on dialysis. The eGFR calculation may not be applicable to patients at the low and high extremes of body sizes, pregnant women, and vegetarians.)  Routine Hem:  02-Jul-14 05:45   WBC (CBC)  16.4  RBC (CBC)  3.42  Hemoglobin (CBC)  11.4  Hematocrit (CBC)  33.6  Platelet Count  (CBC) 287  MCV 98  MCH 33.3  MCHC 33.9  RDW  14.7  Neutrophil % 78.9  Lymphocyte % 13.9  Monocyte % 5.7  Eosinophil % 0.9  Basophil % 0.6  Neutrophil #  12.9  Lymphocyte # 2.3  Monocyte # 0.9  Eosinophil # 0.1  Basophil # 0.1 (Result(s) reported on 22 Mar 2013 at 06:28AM.)   Assessment/Plan:  Assessment/Plan:  Assessment Acute colitis: Likely infectious vs. ischemia.  Improved. 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 OCandace Cruise   Rectal bleeding: Resolved.   Plan 1) Follow up stool cx 2) Agree with cipro/flagyl for 5-7days total 3) [facility]   Electronic Signatures: JAndria Meuse(NP)  (Signed 02-Jul-14 10:23)  Authored: Chief Complaint, VITAL SIGNS/ANCILLARY NOTES, Brief Assessment, Lab Results, Assessment/Plan   Last Updated: 02-Jul-14 10:23 by JAndria Meuse(NP)

## 2015-01-11 NOTE — Consult Note (Signed)
PATIENT NAME:  Adriana Martin, Adriana Martin MR#:  [id number] DATE OF BIRTH:  September 06, 1946  CARDIOLOGY CONSULTATION   DATE OF CONSULTATION:  06/10/2013  CONSULTING PHYSICIAN:  Isaias Cowman, MD  PRIMARY CARE PHYSICIAN: Christena Flake. Raechel Ache, MD  PRIMARY CARDIOLOGIST: Corey Skains, MD  CHIEF COMPLAINT: Palpitations.   HISTORY OF PRESENT ILLNESS: The patient is a 69 year old female with known history of atrial fibrillation/flutter, followed by Dr. Nehemiah Massed. The patient apparently was in her usual state of health until yesterday, when she started to experience palpitations. She was brought to [facility] Emergency Room, where EKG did reveal atrial fibrillation/atrial flutter with rapid ventricular rate in the 130s to 140 BPM range. The patient was treated with 2 doses of intravenous Cardizem, with overall improvement in her rate. The patient was admitted to telemetry, where she has remained predominantly in sinus rhythm with intermittent episodes of atrial flutter with a controlled rate. The patient has ruled out for myocardial infarction by CPK isoenzymes and troponin.   PAST MEDICAL HISTORY:  1. Paroxysmal atrial fibrillation/flutter.  2. Hypertension.  3. Hyperlipidemia.  4. History of primary biliary cirrhosis.  5. Depression.  6. Thrombocytosis.  7. History of breast cancer.   MEDICATIONS ON ADMISSION:  1. Aspirin 1 daily.  2. Cardizem CD 180 mg daily.  3. Hydrochlorothiazide 12.5 mg daily.  4. Metoprolol 25 mg daily.  5. Alprazolam 2.5 mg b.i.d. 6. BuSpar 15 mg t.i.d.  7. Calcium with vitamin D 1 tab b.i.d.  8. Hydrea 500 mg 6 times a week.  9. Lactobacillus 1 b.i.d. 10. Prednisone 5 mg daily.  11. Zetia 10 mg daily.  12. Actigall 300 mg t.i.d.   SOCIAL HISTORY: The patient currently lives with her mother. The patient quit tobacco abuse 15 years ago.   FAMILY HISTORY: No immediate family history of myocardial infarction or coronary artery disease.   REVIEW OF SYSTEMS:  CONSTITUTIONAL: No  fever or chills.  EYES: No blurry vision.  EARS: No hearing loss.  RESPIRATORY: No shortness of breath.  CARDIOVASCULAR: Palpitations as described above.  GASTROINTESTINAL: No nausea, vomiting or diarrhea.  GENITOURINARY: No dysuria or hematuria.  ENDOCRINE: No polyuria or polydipsia.  MUSCULOSKELETAL: No arthralgias or myalgias.  NEUROLOGICAL: No focal muscle weakness or numbness.  PSYCHOLOGICAL: No depression or anxiety.   PHYSICAL EXAMINATION:  VITAL SIGNS: Blood pressure 119/76, pulse 84, respirations 16, temperature 98.3, pulse oximetry 96%.  HEENT: Pupils equally reactive to light and accommodation.  NECK: Supple without thyromegaly.  LUNGS: Clear.  CARDIOVASCULAR: Normal JVP. Normal PMI. Regular rate and rhythm. Normal S1, S2. No appreciable gallop, murmur or rub.  ABDOMEN: Soft and nontender. Pulses were intact bilaterally.  MUSCULOSKELETAL: Normal muscle tone.  NEUROLOGICAL: The patient is alert and oriented x3. Motor and sensory both grossly intact.   IMPRESSION: A 69 year old female with history of paroxysmal atrial fibrillation and atrial flutter, who presents with palpitations, found to be in rapid atrial fibrillation/flutter, which has now resolved. The patient predominantly in sinus rhythm. She has had 1 episode of atrial fibrillation following admission. The patient has ruled out for myocardial infarction by CPK isoenzymes and troponin. The patient has a CHADS2 score of 2, currently on aspirin per Dr. Nehemiah Massed.   RECOMMENDATIONS:  1. Agree with overall current therapy.  2. Would defer full-dose anticoagulation.  3. Would consider chronic anticoagulation. This can be addressed per Dr. Nehemiah Massed as an outpatient. 4. Increase metoprolol succinate to 50 mg daily.  5. If the patient does well today and remains in sinus rhythm,  may discharge without further cardiac noninvasive or invasive evaluation.   ____________________________ Isaias Cowman,  MD ap:OSi D: 06/10/2013 09:29:05 ET T: 06/10/2013 09:39:18 ET JOB#: [id number]  cc: Isaias Cowman, MD, <Dictator> Isaias Cowman MD ELECTRONICALLY SIGNED 07/03/2013 11:24

## 2015-01-11 NOTE — Discharge Summary (Signed)
PATIENT NAME:  Adriana Martin, Adriana Martin MR#:  [id number] DATE OF BIRTH:  1946/01/11  DATE OF ADMISSION:  03/21/2013 DATE OF DISCHARGE:  03/23/2013  PRIMARY CARE PHYSICIAN: Christena Flake. Raechel Ache, MD   GASTROENTEROLOGIST:  Verdie Shire, MD   DISCHARGE DIAGNOSES: 1.  Acute colitis with abdominal pain and diarrhea, which is bloody.  2.  Hypokalemia and hypomagnesemia.  3.  Hematochezia.  4.  Acute renal failure which improved.  5.  Primary biliary cirrhosis.  6.  Atrial fibrillation.  7.  Hyperlipidemia.  8.  History of breast cancer.  9.  Thrombocytosis.   MEDICATIONS ON DISCHARGE: Include: Metoprolol succinate ER 25 mg extended-release daily, Xanax 0.5 mg twice a day as needed for anxiety, ursodiol 300 mg 1 capsule 3 times a day, Cardizem CD 180 mg daily, Zetia 10 mg daily, prednisone 5 mg daily, calcium and vitamin D 1 tablet twice a day, Centrum Silver Women's 1 tablet daily, Hydrea 500 mg orally 6 times a week, vitamin D3 50,000 units once a week, metronidazole 250 mg 1 capsule every 8 hours for 4 days, potassium chloride 10 mEq 1 tablet daily for 7 days, magnesium oxide 400 mg at bedtime for 7 days, lactobacillus 1 capsule twice a day, Cipro 500 mg every 12 hours for 4 days, anastrozole 1 mg daily.  DIET: Low sodium diet, regular consistency.   ACTIVITY: As tolerated.   FOLLOWUP:  With Dr. [name], Gastroenterology, in 2 weeks. In 1 to 2 weeks with Dr. Raechel Ache.   REASON FOR ADMISSION: The patient was admitted June 30th for acute colitis, was empirically started on Cipro and Flagyl.  With her hematochezia, suspected GI bleed initially, also acute on chronic renal failure.   LABORATORY AND RADIOLOGICAL DATA DURING THE HOSPITAL COURSE  INCLUDED:  A hemoglobin A1c of 5.1. Lipase 116. Urinalysis 100 mg/dL of protein, 1+ blood. INR 0.8. White blood cell count 15.7, H and H 13.9 and 40.9, platelet count of 386. Glucose 155, BUN 24, creatinine 1.89, sodium 133, potassium 3.5, chloride 98, CO2 25, calcium 9.5. Liver  function tests: Alkaline phosphatase 145, ALT 27, AST 18. Lactic acid 3.3.  CT scan of the abdomen and pelvis showed findings consistent with fairly diffuse colitis with relative sparing portions of the descending colon and distal rectosigmoid. Findings could be infectious or ischemic.  EKG showed a normal sinus rhythm, left atrial enlargement.  Stool for C. diff. negative. Stool culture negative.  LDL 111, HDL 102, triglycerides 60. Upon discharge, magnesium 1.6, potassium 3.0, creatinine 0.8. Electrolytes were replaced prior to discharge.   HOSPITAL COURSE PER PROBLEM LIST:  1.  For the patient's acute colitis, abdominal pain and bloody diarrhea:  The patient was initially started on IV Cipro and Flagyl. The patient's pain improved.  Diarrhea had improved. Her mother also had similar symptoms but did not require hospitalization which goes along with more of an infectious or food poisoning cause of the colitis. We will continue a total of 1 week's worth of Flagyl and Cipro. The patient was given lactobacillus also upon discharge.  2.  For the patient's hypokalemia and hypomagnesemia: This was replaced IV during the hospitalization and orally upon discharge. Recommend checking a BMP in follow-up appointment.  3.  Hematochezia:  The patient's hemoglobin remained relatively stable. I do not think this was a GI bleed. I think this is more irritation either from the colitis or from frequent bowel movements.  4.  Acute renal failure: This had improved with IV fluid hydration.  5.  Primary biliary cirrhosis:  The patient was given actually stress dose steroids since she is on chronic steroids. This was quickly tapered over to prednisone and down to her usual 5 mg.  6.  For her atrial fibrillation:  She is on metoprolol and Cardizem for rate control. No anticoagulation with the bleeding.  7.  Hyperlipidemia:  On Zetia.  8.  History of breast cancer:  On anastrozole.  9.  For thrombocytosis:  The patient is  on Hydrea.       TIME SPENT ON DISCHARGE:  35 minutes.   ____________________________ Tana Conch. Leslye Peer, MD rjw:cb D: 03/23/2013 15:43:38 ET T: 03/23/2013 22:52:45 ET JOB#: [id number]  cc: Tana Conch. Leslye Peer, MD, <Dictator> Christena Flake. Raechel Ache, MD Lupita Dawn. [name], MD Marisue Brooklyn MD ELECTRONICALLY SIGNED 04/01/2013 11:16

## 2015-01-11 NOTE — H&P (Signed)
PATIENT NAME:  Adriana Martin, MURLEY MR#:  [id number] DATE OF BIRTH:  06-30-1946  DATE OF ADMISSION:  06/09/2013  PRIMARY CARE PHYSICIAN: Dr. [name].  CARDIOLOGIST: Dr. Serafina Royals.   CHIEF COMPLAINT: Palpitations.   HISTORY OF PRESENT ILLNESS: This is a 69 year old female who presented to the hospital due to palpitations and just not feeling well. 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 The patient says that she has been under a lot of stress taking care of her mother. As per the family at bedside, she has not been acting like herself for the past few days. Today when she was having palpitations, they brought her to the ER for further evaluation. When she presented to triage, she was noted to be in rapid A. fib and flutter with heart rates in the 130s to 140s. She received 2 doses of IV Cardizem and her heart rates have improved since then. The patient on routine blood work was also noted to be in acute renal failure with hyponatremia and hypokalemia. Hospitalist services were contacted for further treatment and evaluation.   REVIEW OF SYSTEMS:    CONSTITUTIONAL: No documented fever. No weight gain. No weight loss.  EYES: No blurred or double vision.  EARS, NOSE, THROAT: No tinnitus. No postnasal drip. No redness of the oropharynx.  RESPIRATORY: No cough, no wheeze. No hemoptysis, no dyspnea.  CARDIOVASCULAR: No chest pain, no orthopnea. Positive palpitations. No syncope.  GASTROINTESTINAL: No nausea, no vomiting, no diarrhea. No abdominal pain. No melena or hematochezia.  GENITOURINARY: No dysuria or hematuria.  ENDOCRINE: No polyuria or nocturia. No heat or cold intolerance.  HEMATOLOGIC: No anemia, no bruising, no bleeding.  INTEGUMENTARY: No rashes. No lesions.  MUSCULOSKELETAL: No arthritis. No swelling. No gout.  NEUROLOGIC: No numbness. No tingling. No ataxia. No seizure type activity.   PSYCHIATRIC: No anxiety. No insomnia. No ADD.   PAST MEDICAL HISTORY: Consistent with history of chronic atrial fibrillation/flutter, history of primary biliary cirrhosis, hypertension, depression, history of thrombocytosis, hyperlipidemia, history of breast cancer.   ALLERGIES: The patient has no known drug allergies.   SOCIAL HISTORY: Used to be a smoker, quit about 15 years ago, does have a 30 pack-year smoking history. Occasional alcohol use. No illicit drug abuse. Lives with her mother.   FAMILY HISTORY: The father is deceased, died from multiple myeloma, also was on hemodialysis. Mother is alive, has hypertension, osteoporosis and an abdominal aortic aneurysm.   CURRENT MEDICATIONS: The patient is currently on alprazolam 0.5 mg 1/2 tab b.i.d. as needed, anastrozole 1 mg daily, BuSpar 15 mg t.i.d., calcium with vitamin D 1 tab b.i.d., Cardizem CD 180 mg daily, Centrum multivitamin daily, Hydrea 500 mg 1 tab 6 times a week,  HCTZ 12.5 mg daily, lactobacillus 1 tab b.i.d., metoprolol succinate 25 mg daily, prednisone 5 mg daily, Actigall 300 mg t.i.d. and Zetia 10 mg daily.   PHYSICAL EXAMINATION: Presently is as follows:  VITAL SIGNS: Temperature is 97.5, pulse 113, respirations 16, blood pressure 127/76, sats 98% on room air.  GENERAL: She is a pleasant-appearing female in no apparent distress.  HEAD, EYES, EARS, NOSE, THROAT EXAM: The patient is atraumatic, normocephalic. Extraocular muscles are intact. Pupils are equal and reactive eye to light. Sclerae anicteric. No conjunctival injection. No pharyngeal erythema.  NECK: Supple. There is no jugular venous distention. No bruits, no lymphadenopathy or thyromegaly.  HEART: Irregular. No murmurs, no rubs,  no clicks.  LUNGS: Clear to auscultation bilaterally. No rales or rhonchi. No wheezes.  ABDOMEN: Soft, flat, nontender, nondistended. Has good bowel sounds. No hepatosplenomegaly appreciated.  EXTREMITIES: No evidence of any cyanosis,  clubbing or peripheral edema. Has +2 pedal and radial pulses bilaterally.  NEUROLOGIC: The patient is alert, awake, oriented x 3 with no focal motor or sensory deficits appreciated bilaterally.  SKIN: Moist and warm with no rashes appreciated.  LYMPHATIC: There is no cervical or axillary lymphadenopathy.   LABORATORY AND RADIOLOGICAL DATA: Serum glucose of 203, BUN 21, creatinine 1.6, sodium 126, potassium 3.3, chloride 88, bicarbonate 26. LFTs are within normal limits. Troponin less than 0.02. White cell count 11.9, hemoglobin 14.0, hematocrit 40.2, platelet count of 471. INR 0.9.   The patient did have a chest x-ray done which showed no evidence of acute cardiopulmonary disease.   ASSESSMENT AND PLAN: This is a 69 year old female with history of chronic atrial fibrillation, primary biliary cirrhosis, hypertension, depression, history of thrombocytosis, hyperlipidemia, history of breast cancer, who presented to the hospital with palpitations and noted to be in rapid atrial fibrillation/flutter, also noted to be in acute renal failure with hyponatremia.  1.  Atrial fibrillation/flutter: The patient had some uncontrolled rates when she presented to the hospital but has improved with some IV Cardizem boluses. For now, I will continue her Toprol and Cardizem for rate control. If needed, will use pulse doses of IV Cardizem. Will get a 2-dimensional echocardiogram, also get a cardiology consult, place her on telemetry, follow serial cardiac markers. I am not quite sure why the patient is not on long-term anticoagulation. I will let cardiology further address this. She does have a history of primary biliary cirrhosis, although her INR and hemoglobin are presently stable.  2.  Acute renal failure: I suspect this is likely related to dehydration and her mild hyperglycemia. I will hydrate her with IV fluids, follow BUN and creatinine, hold her hydrochlorothiazide.  3.  Hyponatremia: I suspect this is hypovolemic  hyponatremia from her dehydration and her use of hydrochlorothiazide. I will hold her HCTZ, hydrate her with IV fluids, follow her sodium. 4.  Hypokalemia: I will go ahead and replace her potassium with the IV fluids as mentioned and repeat her potassium in the morning. I will check a magnesium level and if needed will replace. Hold hydrochlorothiazide. 5.  History of primary biliary cirrhosis: I will continue her maintenance prednisone and also her Actigall. The patient follows with Dr. [name]. LFTs are normal.  6.  Hyperlipidemia: Continue Zetia.  7.  Hyperglycemia: The patient's random blood sugar is greater than 200. Questionable if this is related to diabetes. The patient is on maintenance prednisone. I will check a hemoglobin A1c, place her on sliding scale insulin and monitor.  8.  History of breast cancer: Continue anastrozole.  9.  Anxiety: Continue BuSpar and Xanax.   CODE STATUS: The patient is a full code.   TIME SPENT ON ADMISSION: 50 minutes.    ____________________________ Belia Heman. Verdell Carmine, MD vjs:jm D: 06/09/2013 21:40:20 ET T: 06/09/2013 21:51:31 ET JOB#: [id number]  cc: Belia Heman. Verdell Carmine, MD, <Dictator> Henreitta Leber MD ELECTRONICALLY SIGNED 06/16/2013 16:17

## 2015-01-11 NOTE — Discharge Summary (Signed)
PATIENT NAME:  Adriana Martin MR#:  [id number] DATE OF BIRTH:  08/10/1946  DATE OF ADMISSION:  06/09/2013 DATE OF DISCHARGE:  06/10/2013  ADMITTING DIAGNOSIS:  Atrial flutter.   DISCHARGE DIAGNOSES:   1. Atrial flutter in sinus rhythm, converted to sinus rhythm now.  2.  Dehydration.  3.  Hyponatremia.  4.  Hypokalemia. 5.  Hypomagnesemia likely due to hydrochlorothiazide.  6.  Dehydration due to hydrochlorothiazide as well as possibly hot air at home.  7.  History of hypertension.  8.  Hyperlipidemia.  9.  Primary biliary cirrhosis. 10.  Depression.  11.  Thrombocytosis.  12.  Breast carcinoma.   DISCHARGE CONDITION:  Stable.   DISCHARGE MEDICATIONS:  The patient is to resume:  1.  Xanax 0.25 mg by mouth twice daily as needed. 2.  (Dictation Anomaly) Ursodiol 300 mg by mouth three times daily. 3.  Cardizem CD 180 mg by mouth daily.  4.  Zetia 10 mg daily. 5.  Prednisone 5 mg by mouth daily.  6.  Calcium with vitamin D 600 over unknown dose of vitamin D 1 tablet twice daily.  7.  Centrum Silver Women's 1 daily.  8.  Hydrea 500 mg by mouth 6 times a week.  9.  Lactobacillus acidophilus 1 capsule twice daily.  10.  Anastrozole 1 mg by mouth daily.  11.  Buspirone 15 mg by mouth 3 times daily.  12.  Aspirin 325 mg by mouth daily.  13.  Metoprolol succinate 50 mg by mouth daily, this is a new dose.  14.  The patient is not to take HCTZ unless recommended by PCP or cardiology.   HOME OXYGEN:  None.   DIET:  2 gram salt, low fat, low cholesterol, regular consistency.   ACTIVITY LIMITATIONS:  As tolerated.    FOLLOW-UP APPOINTMENT:  With Dr. Nehemiah Massed in two days after discharge and Dr. Raechel Ache in two days after discharge.   CONSULTANTS:  Cardiologist, Dr. Saralyn Pilar.   RADIOLOGIC STUDIES:  Chest x-ray, portable single view, 06/09/2013 revealed no acute disease of the chest.  Echocardiogram 06/10/2013 revealed left ventricular ejection fraction by visual estimation 55% to 60%,  normal global left ventricular systolic function, mildly dilated left atrium, mild to moderate mitral valve regurgitation, mildly increased left ventricular internal cavity size, mild aortic valve stenosis, mild tricuspid valve regurgitation.  HOSPITAL COURSE:  The patient is a 69 year old Caucasian female with past medical history significant for multiple medical problems including history of chronic A-Fib or flutter who presented to the hospital with complaints of palpitations and not feeling well.  Please refer to Dr. Edward Jolly admission note on 06/09/2013.  On arrival to the hospital, the patient's pulse was 113, temperature was 97.5, respiration rate was 16, blood pressure 137/76, O2 sats were 98% on room air.  Physical exam was unremarkable.  The patient's EKG showed atrial fibrillation, atrial flutter.  The patient was admitted to the hospital for further evaluation.  She was given IV Cardizem bolus and her Toprol dose was advanced.  A cardiology consultation as well as 2-D echo was ordered.  Cardiologist saw patient in consultation on 06/10/2013.  He reviewed patient's echocardiogram and felt that the patient should consider the chronic anticoagulation which could be addressed by Dr. Nehemiah Massed as outpatient and recommended to increase metoprolol succinate to 50 mg by mouth daily dose.  The patient converted to sinus rhythm at around 10:00 p.m. on 06/09/2013 and remained in sinus rhythm until the day of discharge.  She was able to  ambulate without any discomfort and was ready to be discharged home.  She is to follow up with Dr. Nehemiah Massed for further recommendations.  She is to continue aspirin therapy until anticoagulation issues addressed by Dr. Nehemiah Massed.  The patient was noted to be hyponatremic as well as hypokalemic and hypomagnesemic on admission to Emergency Room.  Her sodium level was noted to be as low as 126 on the day of admission 06/09/2013, potassium level of 3.3 and magnesium 1.4.  The patient's  HCTZ was stopped and the patient was rehydrated with IV fluids.  With this, the patient's sodium level improved to 133, potassium to 3.1 and magnesium level normalized to 1.9.  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  For this reason, the patient was advised to discontinue HCTZ until recommended by primary care physician, but also drink plenty of fluids and report to primary care physician any problems.  In regards to hypertension, the patient's metoprolol was advanced to 50 mg by mouth daily dose.  If she is to continue Cardizem she is to stop her HCTZ due to above-mentioned reasons.    Hyperlipidemia, primary biliary cirrhosis, depression, thrombocytosis and breast carcinoma, the patient is to continue her outpatient management.  The patient is being discharged in stable condition with above-mentioned medications as well as follow-up.   Her vital signs on the day of discharge:  Temperature was 98.3, pulse was 84, respiratory rate was 16, blood pressure 119/76, saturation was 96% on room air at rest.   TIME SPENT:  40 minutes.    ____________________________ Adriana Grist, MD rv:ea D: 06/10/2013 16:01:21 ET T: 06/10/2013 17:45:32 ET JOB#: [id number]  cc: Adriana Grist, MD, <Dictator> Adriana Skains, MD Adriana Martin. Raechel Ache, MD  Adriana Grist MD ELECTRONICALLY SIGNED 06/26/2013 11:10

## 2015-01-11 NOTE — Consult Note (Signed)
PATIENT NAME:  Adriana Martin, Adriana Martin MR#:  [id number] DATE OF BIRTH:  September 11, 1946  DATE OF CONSULTATION:  03/20/2013  REFERRING PHYSICIAN:   CONSULTING PHYSICIAN:  Micheline Maze, MD  PRIMARY CARE PHYSICIAN: [name], MD  CHIEF COMPLAINT: Abdominal pain and diarrhea.   BRIEF HISTORY: Adriana Martin is a 69 year old woman with 3 day history of abdominal pain and diarrhea. She noted the onset of mild abdominal pain complicated by profound watery diarrhea 48 hours ago. Pain intensified over the last 24 hours which prompted her presentation to the Emergency Room. She noted that her mother had similar symptoms of diarrhea and abdominal pain so she felt they had both eaten something that had prompted the symptoms. However, yesterday, she began to have what appeared to be blood in her stool and she was concerned about an increasingly severe GI problem. She presented to the Emergency Room for further evaluation.   She had no previous similar symptoms. She does have some mild anorexia and has vomited 1 time, but does not feel nauseated at the present time. She denies any fever or chills. She has had no other constitutional symptoms. She has been voiding spontaneously.   She has complicated medical history including atrial fibrillation, currently under control, thrombocytosis, anemia, undiagnosed elevation in liver function studies with hepatic lymphadenopathy and pancreatic lymphadenopathy. She has been followed in the [place] over the last several months by Dr. Inez Pilgrim. She carries the diagnosis of thrombocytosis, but is currently not on any anticoagulation by her history. She has history of recently diagnosed atrial fibrillation, which has been treated with medication and well controlled. She denies any strokelike symptoms. She has no history of chest pain. She has no history of shortness of breath. She denies any history of congestive heart failure or coronary artery disease. She is hypertensive.  Denies  diabetes or thyroid problems. Only previous abdominal surgery was hysterectomy at which time her appendix was removed.   CURRENT MEDICATIONS: Include: 1.  Alprazolam 0.5 mg b.i.d. p.r.n.  2.  Anastrozole 1 mg once a day. 3.  Calcium 600 mg 2 times a day. 4.  Cardizem 180/24 hours once a day.  5.  Hydrea 500 mg 6 times a week. 6.  Metoprolol ER 25 mg once a day. 7.  Prednisone 5 mg once a day. 8.  Ursodiol 300 mg 3 times a day. 9.  Vitamin D. 10.  Zetia 10 mg once a day.   ALLERGIES: No known drug allergies.  REVIEW OF SYSTEMS:  Otherwise unremarkable. She is not a cigarette smoker and does not currently drink alcohol.   PHYSICAL EXAMINATION: GENERAL: She is comfortable just having received some pain medication. She is lying quietly in bed and very cooperative with the examination.  VITAL SIGNS:  Heart rate is 88 and regular without evidence of any atrial fibrillation on the monitor or by exam.  Blood pressure 160/85 and oxygen saturation is 96%.  HEENT: Reveals some mild flushing. No facial deformity. No pupillary abnormalities. No scleral icterus.  NECK: Supple and nontender with midline trachea and no adenopathy.  CHEST: Clear with no adventitious sounds and normal pulmonary excursion.  CARDIAC: Reveals no murmurs or gallops to my ear. She seems to be in normal sinus rhythm. She has PMI just off the midline.  ABDOMEN: Mildly distended, nontender, soft, mildly tympanitic. She has active bowel sounds. No rebound or guarding is noted. She has no hernias or masses.  EXTREMITIES: Reveals no deformities, no edema, full range of motion  and no palpable distal pulses.  PSYCHIATRIC: Reveals normal orientation, normal affect.   IMPRESSION AND PLAN: I have independently reviewed her CT scan. Laboratory values demonstrated white blood cell count of 15,000 and platelet count of 386,000. Creatinine is elevated at 1.89 with a GFR 131. Sodium is slightly depressed at 133 and her alkaline phosphatase is  slightly elevated 145. Lipase is 116. She does have obvious colonic thickening in a diffuse pattern from the mid ascending to transverse colon with patchy involvement along the left side. There is no obvious source. The CT scan was performed without contrast, but there did not appear to be any significant vascular abnormalities. No clear thrombus is identified.   Her history certainly fits with ischemic or infectious colitis. She has multiple reasons to have ischemic colitis with her undiagnosed hepatic and pancreatic problems. She has history of thrombocytosis, anemia, coagulopathy and atrial fibrillation all of which could contribute to her ischemic problems. She also has history of breast cancer and could easily be coagulopathic from breast cancer. At the present time, I do not see any urgent surgical indications. I would admit her and hydrate her fairly aggressively since she has no history of congestive heart failure to see if we can get her to a point we can obtain either an arteriogram or a CT angiogram to better assess her vascular supply. Should she develop free air or change in her symptoms, we would reconsider surgical intervention.  ____________________________ Micheline Maze, MD rle:sb D: 03/20/2013 16:16:30 ET T: 03/20/2013 16:45:07 ET JOB#: [id number]  cc: Micheline Maze, MD, <Dictator> Christena Flake. Raechel Ache, MD Rodena Goldmann MD ELECTRONICALLY SIGNED 03/24/2013 8:24

## 2015-01-11 NOTE — H&P (Signed)
PATIENT NAME:  Adriana Martin, GOUGH MR#:  [id number] DATE OF BIRTH:  [birth year]  DATE OF ADMISSION:  03/20/2013  PRIMARY CARE PHYSICIAN:  Dr. Raechel Ache.   HISTORY OF PRESENT ILLNESS:  The patient is a 69 year old Caucasian female with past medical history significant for history of multiple medical problems including A. fib, thrombocytosis, coagulopathy and breast carcinoma, who presents to the hospital with complaints of diarrhea, as well as blood per rectum. According to the patient, she was doing well up until approximately 2 days ago on Saturday, when she started having diarrhea and abdominal cramping. She was diarrhea every few seconds and finally on Sunday, it became bloody. In fact, she did not have much of diarrhea anymore, but the blood per her rectum was described as bright red blood. She had numerous episodes of blood per her rectum, at least 30 times.  She also admits of tenesmus. Abdominal pain is usually in the lower abdomen. She started having nausea and vomiting this morning. She felt somewhat feverish and chilly over the past few days. Her mother is also sick and having diarrhea; however, no nausea and vomiting. Because of this discomfort, as well as the gastrointestinal bleed, she decided to come to the Emergency Room for further evaluation where she had a CT scan of her abdomen done, which showed colitis, and hospitalist services were contacted for admission.   PAST MEDICAL HISTORY: Significant for hysterectomy in the past, as well as colonoscopy approximately 2 years ago, which was unremarkable; history of A. fib, thrombocytosis, coagulopathy, breast carcinoma. History of unclear liver disease. According to medical records, some familial liver disease, for which she is on prednisone. History of CKD with creatinine level of 1.4. 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 Prominent ovaries on CT scan. Headaches, right lower extremity weakness in the past, inflammation of sciatic nerve noted on the left, history of trauma a few months earlier. History of atrial fibrillation, small M spike was noted in 2013.  The patient's past medial history is also significant for hyperlipidemia, hypertension, as well as depression, as well as hysterectomy.  EGD as well as colonoscopy in May 2012. Liver biopsy in 2012. Information was noted, but no definite diagnosis.   SOCIAL HISTORY: The patient was a prior smoker, quit in 2000, smoked on and off less than 1/2 pack per day for approximately 15 years.   FAMILY HISTORY: Father, multiple myeloma. Brother, esophageal cancer. Also  hyperlipidemia, hypertension, as well as depression in family.   MEDICATIONS: The patient's medication list is as follows:   1.  Alprazolam 0.5 mg 1/2 tablet twice a day as needed.  2.  Anastrozole 1 mg once daily.  3.  Calcium with vitamin D 600 with 200, 1 tablet twice a day.  4.  Cardizem CD 180 mg daily.  5.  Centrum Silver once a day.  6.  Hydrea 500 mg 6 times weekly.  7.  Metoprolol succinate ER 25 mg once daily.  8.  Prednisone 5 mg p.o. once daily.  9.  Ursodiol 300 mg 1 capsule 3 times daily.  10.  Vitamin D3, 50,000 units 1 capsule once a week.  11.  Zetia 10 mg p.o. once daily.   ALLERGIES: According to medical records, the patient is allergic to no medications.   REVIEW OF SYSTEMS: Positive for feeling feverish and chilly at home over  the past few days, fatigued and weak, cataracts, bifocal glasses, lower extremity edema in the feet. She feels that this is because of prednisone. Also intermittent arrhythmias because of history of A. fib. Nausea and vomiting, as well as diarrhea and abdominal pains, as well as rectal bleeding since 2 or 3 days ago. Otherwise, denies any high fevers, pain, except for abnormal. No weight loss or gain.  EYES:  Denies any blurry vision, double vision or glaucoma.  ENT: Denies any tinnitus, allergies, epistaxis, sinus tenderness or difficulty swallowing.  RESPIRATORY: Denies any wheezing, asthma, COPD. No cough. CARDIOVASCULAR: Denies any chest pains, orthopnea, palpitations or syncope.  GASTROINTESTINAL:  Denies any hematemesis, rectal bleeding, except as mentioned above. The patient does have change in bowel habits.  GENITOURINARY: Denies any dysuria, hematuria, frequency or incontinence.  ENDOCRINOLOGY: Denies any polydipsia, nocturia, thyroid problems, heat or cold intolerance or thirst.  HEMATOLOGIC: Denies anemia, easy bruising, bleeding or swollen glands.  SKIN: Denies any acne, rashes, lesions, change in moles.  MUSCULOSKELETAL: Denies arthritis, cramps, swelling.  NEUROLOGIC:  No numbness, epilepsy or tremor.  PSYCHIATRIC: Denies anxiety or insomnia.   PHYSICAL EXAMINATION: VITAL SIGNS: On arrival to the hospital, temperature 97.7, pulse was 94, respiratory rate was 20, blood pressure 145/87, saturation was 99% on room air.  GENERAL:  This is a well-nourished Caucasian female in mild distress test secondary to abdominal pains. She intermittently would hold her abdomen because of significant discomfort in her lower abdomen. She is mildly obese, otherwise in no significant distress. Her face seemed to be flushed.  HEENT: Her pupils were equal, reactive to light. Extraocular muscles intact. No icterus or conjunctivitis. Has normal hearing. No pharyngeal erythema. Mucosa is moist.  NECK: No masses. Supple, nontender. Thyroid is not enlarged. No adenopathy. No JVD or carotid bruits bilaterally. Full range of motion.  LUNGS: Clear to auscultation in all fields. No evidence of diminished breath sounds or wheezing. A few crackles were heard at the bases posteriorly, but no labored inspirations, increased effort, dullness to percussion or respiratory distress.  CARDIOVASCULAR: S1, S2 appreciated. No  murmurs, gallops or rubs. (Dictation Anomaly) Rythm was  irregularly irregular rhythm. PMI not lateralized. Chest is nontender to palpation.  Somewhat diminished pedal pulses. No lower extremity edema, calf tenderness or cyanosis was noted.  ABDOMEN: Protuberant, soft. No significant tenderness.  Minimal discomfort was felt in the upper abdomen; however, more discomfort and pain in the lower quadrants, as well as suprapubically and periumbilically. Overall, the patient's abdomen is diffusely somewhat tender. The patient has some guarding. No hepatosplenomegaly or masses were noted. (Dictation Anomaly) Rectal exam was deferred. Bowel sounds are present but diminished.  MUSCLE STRENGTH: Able to move extremities. No cyanosis, degenerative joint disease or kyphosis. Gait is not tested.  SKIN: No rashes, lesions, erythema, nodularity or induration. It was warm and dry to palpation.  LYMPHATICS:  No adenopathy in the cervical region.  NEUROLOGICAL: Cranial nerves grossly intact. Sensory is intact. No dysarthria or aphasia. The patient is alert, oriented to time, person and place, cooperative. Memory is good. No significant confusion, agitation or depression.   DIAGNOSTICS AND LABORATORY DATA: EKG is pending. Patient's lab data revealed glucose 155, BUN and creatinine were 24 and 1.89, respectively; sodium 133, bicarbonate is 25. Estimated GFR for African American would be 31 and for non- African American it would be 27. The patient's baseline creatinine is around 1.4. Liver enzymes revealed alkaline phosphatase of 145, otherwise unremarkable. White blood cell count is elevated to 15.7,  hemoglobin 13.9, platelet count 386. Coagulation panel was normal with pro time 11.8 and INR was 0.8.  Urinalysis: Amber cloudy urine. Negative for glucose, bilirubin or ketones. Specific gravity 1.024, pH was 5.0, 1+ blood, 100 mg of protein, negative for nitrites, trace leukocyte esterase, 3 red blood cell, 4 white blood cells,  trace bacteria, trace epithelial cells. Mucus was present, as well as hyaline casts.   RADIOLOGIC STUDIES: CT scan of abdomen and pelvis without contrast, 03/20/2013, revealed findings consistent with fairly diffuse colitis with relative sparing portions of  descending colon and distal rectosigmoid. Findings could be infectious or ischemic, according to radiologist. Abdominal aorta exhibited no evidence of aneurysm and nothing else but small amount of mural calcification.  Mild distention of the gallbladder, which may be due to fasting state or could reflect intrinsic gallbladder pathology, according to the radiologist. No stones were evident. No acute urinary tract abnormality. Uterus was surgically absent. Minimal (Dictation Anomaly) compressive atelectasis at lung bases was also noted.   ASSESSMENT AND PLAN: 1.  Acute colitis, questionable ischemic or infectious. Admitted the patient to the medical floor. Started her on Cipro, Flagyl, IV fluids. We will get stool cultures. We will get gastroenterologist involved.  2.  Gastrointestinal bleed, lower. We will get bleeding scan, if bleeding recurs. We will also get hemoglobin levels every 6 to 8 hours and transfuse as necessary.  3.  Acute on chronic renal failure. Continue the patient on IV fluids. Follow up kidney function.  4.  Questionable urinary tract infection. We will get urine cultures, and we will continue the patient on ciprofloxacin and adjust antibiotics according to the culture results.  5.  Dehydration. We will continue IV fluids as above.  6.  History of atrial fib. We will get EKG. We will continue the patient on her usual outpatient medications; metoprolol to control her rate.  7.  History of liver abnormalities. We will continue with Ursodiol, as well as prednisone and Zetia. 8.  History of hyperlipidemia. We will continue Zetia. We will get lipid panel.   TIME SPENT: 50 minutes on this patient.  ____________________________ Theodoro Grist, MD rv:dmm D: 03/20/2013 19:59:18 ET T: 03/20/2013 21:07:15 ET JOB#: [id number]  cc: Theodoro Grist, MD, <Dictator> Christena Flake. Raechel Ache, MD Theodoro Grist MD ELECTRONICALLY SIGNED 04/05/2013 6:46

## 2015-01-11 NOTE — Discharge Summary (Signed)
PATIENT NAME:  Adriana Martin, Adriana Martin MR#:  [id number] DATE OF BIRTH:  1946-05-02  DATE OF ADMISSION:  03/21/2013 DATE OF DISCHARGE:  03/23/2013  PRIMARY CARE PHYSICIAN: Christena Flake. Raechel Ache, MD   GASTROENTEROLOGIST:  Verdie Shire, MD   DISCHARGE DIAGNOSES: 1.  Acute colitis with abdominal pain and diarrhea, which is bloody.  2.  Hypokalemia and hypomagnesemia.  3.  Hematochezia.  4.  Acute renal failure which improved.  5.  Primary biliary cirrhosis.  6.  Atrial fibrillation.  7.  Hyperlipidemia.  8.  History of breast cancer.  9.  Thrombocytosis.   MEDICATIONS ON DISCHARGE: Include: Metoprolol succinate ER 25 mg extended-release daily, Xanax 0.5 mg twice a day as needed for anxiety, ursodiol 300 mg 1 capsule 3 times a day, Cardizem CD 180 mg daily, Zetia 10 mg daily, prednisone 5 mg daily, calcium and vitamin D 1 tablet twice a day, Centrum Silver Women's 1 tablet daily, Hydrea 500 mg orally 6 times a week, vitamin D3 50,000 units once a week, metronidazole 250 mg 1 capsule every 8 hours for 4 days, potassium chloride 10 mEq 1 tablet daily for 7 days, magnesium oxide 400 mg at bedtime for 7 days, lactobacillus 1 capsule twice a day, Cipro 500 mg every 12 hours for 4 days, anastrozole 1 mg daily.  DIET: Low sodium diet, regular consistency.   ACTIVITY: As tolerated.   FOLLOWUP:  With Dr. [name], Gastroenterology, in 2 weeks. In 1 to 2 weeks with Dr. Raechel Ache.   REASON FOR ADMISSION: The patient was admitted June 30th for acute colitis, was empirically started on Cipro and Flagyl.  With her hematochezia, suspected GI bleed initially, also acute on chronic renal failure.   LABORATORY AND RADIOLOGICAL DATA DURING THE HOSPITAL COURSE  INCLUDED:  A hemoglobin A1c of 5.1. Lipase 116. Urinalysis 100 mg/dL of protein, 1+ blood. INR 0.8. White blood cell count 15.7, H and H 13.9 and 40.9, platelet count of 386. Glucose 155, BUN 24, creatinine 1.89, sodium 133, potassium 3.5, chloride 98, CO2 25, calcium 9.5. Liver  function tests: Alkaline phosphatase 145, ALT 27, AST 18. Lactic acid 3.3.  CT scan of the abdomen and pelvis showed findings consistent with fairly diffuse colitis with relative sparing portions of the descending colon and distal rectosigmoid. Findings could be infectious or ischemic.  EKG showed a normal sinus rhythm, left atrial enlargement.  Stool for C. diff. negative. Stool culture negative.  LDL 111, HDL 102, triglycerides 60. Upon discharge, magnesium 1.6, potassium 3.0, creatinine 0.8. Electrolytes were replaced prior to discharge.   HOSPITAL COURSE PER PROBLEM LIST:  1.  For the patient's acute colitis, abdominal pain and bloody diarrhea:  The patient was initially started on IV Cipro and Flagyl. The patient's pain improved.  Diarrhea had improved. Her mother also had similar symptoms but did not require hospitalization which goes along with more of an infectious or food poisoning cause of the colitis. We will continue a total of 1 week's worth of Flagyl and Cipro. The patient was given lactobacillus also upon discharge.  2.  For the patient's hypokalemia and hypomagnesemia: This was replaced IV during the hospitalization and orally upon discharge. Recommend checking a BMP in follow-up appointment.  3.  Hematochezia:  The patient's hemoglobin remained relatively stable. I do not think this was a GI bleed. I think this is more irritation either from the colitis or from frequent bowel movements.  4.  Acute renal failure: This had improved with IV fluid hydration.  5.  Primary biliary cirrhosis:  The patient was given actually stress dose steroids since she is on chronic steroids. This was quickly tapered over to prednisone and down to her usual 5 mg.  6.  For her atrial fibrillation:  She is on metoprolol and Cardizem for rate control. No anticoagulation with the bleeding.  7.  Hyperlipidemia:  On Zetia.  8.  History of breast cancer:  On anastrozole.  9.  For thrombocytosis:  The patient is  on Hydrea.       TIME SPENT ON DISCHARGE:  35 minutes.   ____________________________ Tana Conch. Leslye Peer, MD rjw:cb D: 03/23/2013 15:43:38 ET T: 03/23/2013 22:52:45 ET JOB#: [id number]  cc: Tana Conch. Leslye Peer, MD, <Dictator> Christena Flake. Raechel Ache, MD Lupita Dawn. [name], MD Marisue Brooklyn MD ELECTRONICALLY SIGNED 04/01/2013 11:16

## 2015-01-11 NOTE — Consult Note (Signed)
PATIENT NAME:  Adriana Martin, Adriana Martin MR#:  [id number] DATE OF BIRTH:  03-04-1946  DATE OF CONSULTATION:  08/20/2013  REFERRING PHYSICIAN:  Dr.  Margaretmary Eddy. CONSULTING PHYSICIAN:  Corey Skains, MD  REASON FOR CONSULTATION: Acute atrial fibrillation with rapid ventricular rate, hypertension, COPD with acute diastolic congestive heart failure.   CHIEF COMPLAINT:  Short of breath.   HISTORY OF PRESENT ILLNESS: This is a 69 year old female with history of paroxysmal atrial fibrillation who has done relatively well on multiple medications with aspirin. The patient has had new onset of cough and congestion and possible COPD exacerbation, for which she has been taking some over-the-counter medications. It is possible this has also exacerbated her atrial fibrillation with rapid ventricular rate for which was difficult to control. She was placed on a diltiazem drip and slightly better controlled at this time. There is no evidence of myocardial infarction or new EKG changes consistent with congestive heart failure, but she does have some mild amount of diastolic dysfunction and congestive heart failure with a mild amount of pulmonary edema. Her BNP is 3561. Troponin is normal. She has had previous echocardiogram showing normal LV systolic function with ejection fraction 55%.   REVIEW OF SYSTEMS: The remainder review of systems is negative for weight gain, weight loss, weakness, fatigue, vision change, ringing in the ears, hearing loss, cough, congestion, heartburn, nausea, vomiting, diarrhea, bloody stools, stomach pain, extremity pain, leg weakness, cramping in buttocks, known blood clots, blackouts, dizziness spells, nosebleeds, frequent urination, urination at night, muscle weakness, numbness, anxiety, depression, skin lesions, or skin rashes.   PAST MEDICAL HISTORY: 1. Atrial fibrillation.  2. Hypertension. 3. Hyperlipidemia.  4. Chronic obstructive pulmonary disease.  5. Biliary cirrhosis.   FAMILY  HISTORY: No family members with early onset of cardiovascular disease or hypertension.   SOCIAL HISTORY: Remote tobacco use. Occasionally drinking alcohol.   ALLERGIES: As listed.   MEDICATIONS: As listed.   PHYSICAL EXAMINATION: VITAL SIGNS: Blood pressure is 102/68 bilaterally, heart rate is 110 upright, reclining, and irregular.  GENERAL: She is a well appearing female in no acute distress.  HEENT: No icterus, thyromegaly, ulcers, hemorrhage, or xanthelasma.  CARDIOVASCULAR: Irregularly irregular with normal S1 and S2 with no murmur, gallop, or rub. PMI is diffuse. Carotid upstroke normal without bruit. Jugular venous pressure is normal.  LUNGS: Few basilar crackles with normal respirations.  ABDOMEN: Soft, nontender, without hepatosplenomegaly or masses. Abdominal aorta is normal size without bruit.  EXTREMITIES: Show 2+ bilateral pulses in dorsal, pedal, radial and femoral arteries without lower extremity edema, cyanosis, clubbing, or ulcers.  NEUROLOGIC: She is oriented to time, place, and person, with normal mood and affect.   ASSESSMENT: This is a 69 year old female with hypertension, chronic obstructive pulmonary disease, possibly exacerbating paroxysmal atrial fibrillation with rapid ventricular rate and acute diastolic dysfunction, congestive heart failure and pulmonary edema.   RECOMMENDATIONS: 1. Heart rate control of atrial fibrillation with previous medications including beta blocker, Cardizem combination, but increased beta blocker dosage for better heart rate control.  2. Serial ECG and enzymes to assess for possible myocardial infarction or other causes.  3. Treatment of possible chronic obstructive pulmonary disease exacerbation or other infection spurring heart rate   at this time. 4. Possible consideration of repeat echocardiogram for extensive diastolic dysfunction, valvular heart disease and/or pulmonary hypertension exacerbating symptoms as above with adjustments of  medications.  5. Consider anticoagulation although we will watch for liver enzyme elevation and other interactions with medication management with biliary cirrhosis and concerns  for side effects of medication.  6. Ambulation and followup for improvements of symptoms with possible discharge to home with follow-up as an outpatient with further adjustments of medication management.  ____________________________ Corey Skains, MD bjk:sg D: 08/20/2013 07:22:59 ET T: 08/20/2013 12:01:58 ET JOB#: [id number]  cc: Corey Skains, MD, <Dictator> Corey Skains MD ELECTRONICALLY SIGNED 08/29/2013 12:56

## 2015-01-11 NOTE — Consult Note (Signed)
Brief Consult Note: Diagnosis: Colitis & rectal bleeding.   Patient was seen by consultant.   Consult note dictated.   Comments: Adriana Martin is a pleasant 69 y/o female admitted with acute diffuse colitis with sparing of descending and distal rectosigmoid.  She did note bloody diarrhea, but hgb is normal.  Her mother was also ill after the same meal 3 days ago, but is now better.  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 [name].  WBC 21.3 & lactic acid was elevated.  Differentials include infectious etiology versus ischemic colitis.  Colonoscopy is up to date.  Plan: 1) Follow up stool studies 2) Agree with cipro/flagyl for 5-7days 3) Rest, supportive measures 4) Montior Hgb  Thanks for consult.  Please see full dictated note.  #[id number].  Electronic Signatures: Andria Meuse (NP)  (Signed 01-Jul-14 13:02)  Authored: Brief Consult Note   Last Updated: 01-Jul-14 13:02 by Andria Meuse (NP)

## 2015-01-11 NOTE — Consult Note (Signed)
PATIENT NAME:  Adriana Martin, Adriana Martin MR#:  [id number] DATE OF BIRTH:  [birth year]  DATE OF CONSULTATION:  03/21/2013  REFERRING PHYSICIAN:  Theodoro Grist, MD CONSULTING PHYSICIAN:  Lucilla Lame, MD/Arsema Tusing Evalina Field, NP  REASON FOR CONSULTATION: Colitis and rectal bleeding.   PRIMARY CARE PHYSICIAN: Adriana Flake. Raechel Ache, MD  GASTROENTEROLOGIST: Lupita Dawn. [name], MD   HISTORY OF PRESENT ILLNESS: Adriana Martin is a 69 year old Caucasian female who was in her usual state of health until 3 days ago. She and her mother both ate a meal of meatloaf, squash, onions and biscuits. They both became acutely ill with stomach cramps and diarrhea. A few hours later, the diarrhea turned to bloody stools. She describes the blood as bright red blood in small to moderate amount. She had not seen any bleeding before this episode. Her mother is now feeling much better. She denies any recent antibiotics or new medications. She denies any foreign travel or contact with livestock. She has had 1 episode of nausea and vomiting. She did have low-grade fever and chills. She denies any recent weight loss or anorexia. Her lactic acid was 3.3. White blood cell count was 15.7 yesterday, up to 21.3 today. Her hemoglobin is normal, and her C. difficile was negative. She has been started on Cipro 400 mg b.i.d. and Flagyl 500 mg t.i.d. She also has morphine, Zofran and Phenergan p.r.n. She is on clear liquids. CT scan of abdomen and pelvis without contrast on June 30 shows diffuse colitis with sparing of the descending and distal rectosigmoid, infectious versus ischemic colitis. She has a mildly distended gallbladder and minimal compressive atelectasis of the lung bases.   PAST MEDICAL AND SURGICAL HISTORY: She had an EUS by Dr. Ardis Hughs in August 2012, when she had a 3.5 cm attenuation of lymph nodes that were perihepatic and peripancreatic. She had an EGD on 02/04/2011 by Dr. [name] which showed LA grade A reflux esophagitis and benign biopsy. She had a normal  colonoscopy on 02/04/2011 by Dr. [name]. 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 She has iron deficiency anemia, atrial fibrillation, thrombocytosis, partial hysterectomy, breast cancer in remission 2 years, chronic kidney disease, hyperlipidemia, hypertension,   MEDICATIONS PRIOR TO ADMISSION:  1. Alprazolam 0.5 mg b.i.d. p.r.n.  2. Anastrozole 1 mg daily.  3. Calcium 600 mg b.i.d. 4. Cardizem 180/24 mg once a day. 5. Hydrouria 500 mg 6 times per week.  6. Metoprolol ER 25 mg daily.  7. Prednisone 5 mg daily.  8. Ursodiol 300 mg t.i.d. 9. Vitamin D. 10. Zetia 10 mg daily.   ALLERGIES: No known drug allergies.   FAMILY HISTORY: Positive for a father with multiple myeloma, 1 brother with esophageal carcinoma. There is a family history of liver disease.   SOCIAL HISTORY: She has a 15-year history of smoking half-pack per day, but quit. Denies any alcohol or drug use.   REVIEW OF SYSTEMS: See HPI.  CARDIOVASCULAR: She has had some lower extremity edema in both feet. She also notes swelling to her abdomen.  Otherwise, negative complete review of systems.   PHYSICAL EXAMINATION:  VITAL SIGNS: Temperature 98.1, pulse 93, respirations 20, blood pressure 177/95, O2 saturation 95% on room air.  GENERAL: She is a well-developed, well-nourished Caucasian female in no acute distress. Her daughter is at her bedside.  HEENT: Sclerae clear, nonicteric. Conjunctivae pink. Oropharynx pink and moist without any lesions.  NECK: Supple without any mass or thyromegaly.  CHEST: Heart rate is irregularly irregular without any murmurs, clicks, rubs or gallops.  LUNGS: Clear to auscultation bilaterally.  ABDOMEN: Protuberant. Positive bowel sounds x4. No bruits auscultated. Abdomen is soft, mildly distended, tympanic, with mild tenderness throughout the entire abdomen. There is  no rebound tenderness or guarding. No hepatosplenomegaly or mass, although exam is limited given the patient's body habitus.  EXTREMITIES: With trace pretibial edema bilaterally.  SKIN: Warm and dry.  MUSCULOSKELETAL: Good strength bilaterally.  NEUROLOGIC: Grossly intact.  PSYCHIATRIC: Alert, oriented, pleasant, cooperative, normal mood and affect.   LABORATORY STUDIES: Glucose 138, otherwise normal MET-7. Lipase normal. Hemoglobin A1c 5.1. Alkaline phosphatase 145, otherwise normal LFTs. INR 0.8.   IMPRESSION: Adriana Martin is a pleasant 69 year old female admitted with acute diffuse colitis with sparing of the descending and distal rectosigmoid colon. She did note bloody diarrhea, but hemoglobin is normal thus far. Her mother was also ill after the same meal 3 days ago, but is now better. 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 [name]. White blood cell count was 21.3, and lactic acid was elevated. Differentials including infectious etiology versus ischemic colitis. Colonoscopy is up to date.   PLAN: 1. Follow up with stool studies.  2. Agree with Cipro and Flagyl for 5 to 7 days.  3. Rest, supportive measures.  4. Monitor hemoglobin.   We would like to thank you for allowing for [place] to participate in the care of Adriana Martin.    ____________________________ Andria Meuse, NP klj:OSi D: 03/21/2013 13:11:01 ET T: 03/21/2013 13:20:51 ET JOB#: [id number]  cc: Andria Meuse, NP, <Dictator> Adriana Flake. Raechel Ache, MD [place] ELECTRONICALLY SIGNED 03/29/2013 12:31

## 2015-01-12 NOTE — H&P (Signed)
PATIENT NAME:  Adriana Martin, Adriana Martin MR#:  [id number] DATE OF BIRTH:  1946-09-18  DATE OF ADMISSION:  11/03/2013  REFERRING PHYSICIAN: Dr. Jasmine December.   PRIMARY CARE PHYSICIAN: Dr. Raechel Ache.   CHIEF COMPLAINT: Shortness of breath.   HISTORY OF PRESENT ILLNESS: A 69 year old Caucasian female with past medical history of atrial fibrillation, hypertension and significant tobacco abuse, presenting with shortness of breath. Describes 2 weeks' duration of shortness of breath with dyspnea on exertion with associated cough which is productive of whitish sputum. Denies any fevers, chills or recent sick contacts. She does not trace lower extremity edema but denies orthopnea or PND. She presented for progressively worsening symptoms. Finally, she presented to the Emergency Department. Found to be hypoxemic, saturating in the 80s on room air on arrival. She was then placed on supplemental O2 and given DuoNeb therapy. She is currently improved as far as respiratory status is concerned. Currently complaining only of anxiety.   REVIEW OF SYSTEMS:   CONSTITUTIONAL: Denies fever, fatigue, weakness, pain.  EYES: Denies blurry vision, double vision, eye pain.  EARS, NOSE, THROAT: Denies tinnitus, ear pain, hearing loss.  RESPIRATORY: Positive for cough and shortness of breath as described above.  CARDIOVASCULAR: Denies chest pain, palpitations, edema GASTROINTESTINAL: Denies nausea, vomiting, diarrhea, abdominal pain.  GENITOURINARY: Denies dysuria or hematuria.  ENDOCRINE: Denies nocturia or thyroid problems.  HEMATOLOGIC AND LYMPHATIC: Denies easy bruising or bleeding.  SKIN: Denies rash or lesion.  MUSCULOSKELETAL: Denies pain in neck, back, shoulder, knees, hips or arthritic symptoms.  NEUROLOGIC: Denies paralysis or paresthesias.  PSYCHIATRIC: Positive for anxiety. Denies any depressive symptoms.   Otherwise, full review of systems performed by me is negative.   PAST MEDICAL HISTORY: Atrial fibrillation, primary  biliary cirrhosis, hypertension, thrombocytosis, hyperlipidemia, tobacco abuse.   SOCIAL HISTORY: Occasional alcohol use. Drinks beer. Remote use of tobacco. Has apparently not smoked in about 15 years. Denies any drug use.   FAMILY HISTORY: Positive for multiple myelomas.    ALLERGIES: No known drug allergies.   HOME MEDICATIONS: Include amiodarone 200 mg p.o. daily, Hydrea 500 mg p.o. 6 times a week, anastrozole 1 tablet p.o. daily, alprazolam 0.25 mg p.o. b.i.d. as needed for anxiety, buspirone 15 mg p.o. 3 times daily, ursodiol 300 mg p.o. 3 times daily, calcium 600 plus D 1 tablet p.o. twice daily, Centrum Silver 1 tablet p.o. daily.   PHYSICAL EXAMINATION:  VITAL SIGNS: Temperature 97.4; heart rate 92; respirations 30 on arrival, currently 18; blood pressure 181/89; saturating 86% on room air, currently saturating 96% on supplemental O2. Weight 83.9 kg, BMI 33.9.  GENERAL: Well-nourished, well-developed, Caucasian female currently in minimal distress secondary to anxiety.  HEAD: Normocephalic, atraumatic.  EYES: Pupils equal, round and reactive to light. Extraocular muscles intact. No scleral icterus.  MOUTH: Moist mucosal membranes. Dentition intact. No abscess noted.  EARS, NOSE, THROAT: Throat clear without exudates. No external lesions.  NECK: Supple. No thyromegaly. No nodules. No JVD.  PULMONARY: Decreased breath sounds at bases without wheezes, rubs or rhonchi. No use of accessory muscles. Good respiratory effort. Currently not tachypneic.  CHEST: Nontender to palpation.  CARDIOVASCULAR: S1, S2, regularrate regular rhythm. No murmurs, rubs or gallops. Trace edema to ankle only. Pedal pulses 2+ bilaterally.  GASTROINTESTINAL: Soft, nontender. nondistended. No masses. Positive bowel sounds. No hepatosplenomegaly.  MUSCULOSKELETAL: No swelling, clubbing. Positive for edema as described above. Range of motion full in all extremities.  NEUROLOGIC: Cranial nerves II through XII intact.  No gross focal neurological deficits. Sensation intact. Reflexes intact.  SKIN: No ulcerations, lesions, rash, cyanosis. Skin warm, dry. Turgor intact.  PSYCHIATRIC: Mood and affect acutely anxious. Awake, alert, oriented x 3. Insight and judgment intact.   LABORATORY DATA: EKG revealing normal sinus rhythm, heart rate 94. Sodium 123, potassium 4.1, chloride 88, bicarb 24, BUN 19, creatinine 1.22, glucose 103. BNP 1466. LFTs: Total protein 8.5, alk phos 144, AST 38, remainder within normal limits. WBC 10.3, hemoglobin 13.2, platelets 422. Chest x-ray performed revealing bilateral infiltrates.   ASSESSMENT AND PLAN: A 69 year old Caucasian female with history of atrial fibrillation, hypertension, tobacco use. Presenting with shortness of breath.  1. Chronic obstructive pulmonary disease exacerbation: Provide DuoNeb therapy q.4 hours. Supplemental oxygen to keep oxygen greater than 92%. Levaquin to cover infiltrates as seen on chest x-ray, as well as add incentive spirometry for chest PT. Solu-Medrol 60 mg IV daily.  2. Hyponatremia: Intravenous fluid hydration with normal saline. Follow basic metabolic panel.  3. Atrial fibrillation, on amiodarone. She is off warfarin therapy. Restart aspirin as per home medications.  4. Anxiety: Alprazolam p.r.n.  5. Venous thromboembolism prophylaxis with heparin subcutaneous.   The patient is FULL CODE.   TIME SPENT: 45 minutes.    ____________________________ Aaron Mose. Rayanna Matusik, MD dkh:gb D: 11/02/2013 68:37:29 ET T: 11/03/2013 00:38:06 ET JOB#: [id number]  cc: Aaron Mose. Allister Lessley, MD, <Dictator> Mohmed Farver Woodfin Ganja MD ELECTRONICALLY SIGNED 11/03/2013 21:04

## 2015-01-12 NOTE — Discharge Summary (Signed)
PATIENT NAME:  Adriana Martin, Adriana Martin MR#:  [id number] DATE OF BIRTH:  08-25-1946  DATE OF ADMISSION:  11/02/2013 DATE OF DISCHARGE:  11/04/2013  DISCHARGE DIAGNOSES: 1.  Pneumonia.  2.  Hyponatremia.  3.  Hypertension.  4.  Primary biliary cirrhosis.  5.  Chronic atrial fibrillation.  6.  Essential thrombocytosis.   MEDICATIONS  1.  Xanax 0.5 mg half tablet p.o. b.i.d. as needed for anxiety. 2.  Ursodiol 300 mg p.o. t.i.d.  3.  Hydrea 500 mg 6 times a week, 1 tablet.  4.  BuSpar 15 mg p.o. t.i.d.  5.  Anastrazole1 mg p.o. daily.  6.  Amiodarone 200 mg p.o. daily.  7.  Aspirin 325 mg p.o. daily.  8.  Cardizem CD 180 mg p.o. daily.  9.  Metoprolol 50 mg p.o. daily.  10.  Prednisone 20 mg tablet, take 2 tablets for 3 days, then 1 tablet daily for 3 days, and then resume home dose of 10 mg daily.  11.  Levaquin 500 mg p.o. daily for 10 days.  12.  Advair Diskus 100/50, 1 puff b.i.d.  13.  Albuterol 2 puffs 4 times daily.   OUTPATIENT DIET: Low sodium diet with regular consistency.   Follow up with her primary doctor, Dr. Raechel Ache. in one week HOSPITAL COURSE: A 69 year old female patient came in because of shortness of breath, cough and phlegm. O2 sats were 80% on room air, and the patient was found to have pneumonia and COPD exacerbation, stated on IV Levaquin, along with oxygen and Solu-Medrol. The patient also found to have hyponatremia with sodium of 123 on admission.   1.  Regarding pneumonia, she did improve with Levaquin and nebulizers, so we have discharged her with Levaquin, nebulizers, along with steroids and inhalers. The patient's O2 sats improved. At the time of discharge, they were 93% on room air and heart rate was 100, temperature is 97.6, blood pressure 148/86.  2.  Hyponatremia due to dehydration. She did receive fluids when she came. Later on, sodium improved. At the time of discharge, it was 137.  3.  Essential thrombocytosis. The patient's  platelets are 316. She does take  Hydrea, and she follows up with Dr. Inez Pilgrim.  4.  History of breast cancer. She is on anastrazole.  5.  History of atrial fibrillation. She is on Cardizem and only aspirin. She follows up with Dr. Nehemiah Massed.  6.  She has a history of primary biliary cirrhosis on ursodiol, and she is not on blood thinners because of her CHADS2 score of 1. She is only on aspirin and Cardizem, and the patient will follow up with her primary doctor cardiologist as needed. She history of primary biliary cirrhosis, and she is on ursodiol and prednisone. Continue that.   Discharged home in stable condition.   TIME SPENT ON DISCHARGE PREPARATION: More than 30 minutes.   ____________________________ Epifanio Lesches, MD sk:dmm D: 11/06/2013 11:33:16 ET T: 11/06/2013 12:29:55 ET JOB#: [id number]  cc: Epifanio Lesches, MD, <Dictator> Epifanio Lesches MD ELECTRONICALLY SIGNED 11/15/2013 15:29

## 2015-01-14 LAB — CREATININE, SERUM: Creatine, Serum: 1.22

## 2015-01-20 NOTE — Op Note (Signed)
PATIENT NAME:  Adriana Martin, Adriana Martin MR#:  [id number] DATE OF BIRTH:  1946/04/30  DATE OF PROCEDURE:  10/18/2014  PREOPERATIVE DIAGNOSIS:  Nuclear sclerotic cataract left eye.  ICD-10 H25.12  POSTOPERATIVE DIAGNOSIS:  Nuclear sclerotic cataract left eye.  PROCEDURE:  Phacoemulsification with posterior chamber intraocular lens implantation of the left eye.   LENS: ZCB00, 20.0-diopter posterior chamber intraocular lens.  ULTRASOUND TIME:  19% of 1 minute, 6 seconds.  CDE 7.5.  SURGEON:  Mali Sayf Kerner, MD  ANESTHESIA:  Topical with tetracaine drops and 2% Xylocaine jelly.  COMPLICATIONS:  None.  DESCRIPTION OF PROCEDURE:  The patient was identified in the holding room and transported to the operating room and placed in the supine position under the operating microscope.  The left eye was identified as the operative eye and it was prepped and draped in the usual sterile ophthalmic fashion.  A 1 millimeter clear-corneal paracentesis was made at the 1:30 position.  The anterior chamber was filled with Viscoat viscoelastic.  A 2.4 millimeter keratome was used to make a near-clear corneal incision at the 10:30 position.  A curvilinear capsulorrhexis was made with a cystotome and capsulorrhexis forceps.  Balanced salt solution was used to hydrodissect and hydrodelineate the nucleus.  Phacoemulsification was then used in stop and chop fashion to remove the lens nucleus and epinucleus.  The remaining cortex was then removed using the irrigation and aspiration handpiece. Provisc was then placed into the capsular bag to distend it for lens placement.  A ZCB00, 20.0-diopter lens was then injected into the capsular bag.  The remaining viscoelastic was aspirated.  Wounds were hydrated with balanced salt solution.  The anterior chamber was inflated to a physiologic pressure with balanced salt solution.  0.1 mL of cefuroxime 10 mg/mL were injected into the anterior chamber for a dose of 1 mg of intracameral  antibiotic at the completion of the case. Miostat was placed into the anterior chamber to constrict the pupil.  No wound leaks were noted.  Topical Vigamox drops and Maxitrol ointment were applied to the eye.  The patient was taken to the recovery room in stable condition without complications of anesthesia or surgery.    ____________________________ Wyonia Hough, MD crb:at D: 10/18/2014 09:02:07 ET T: 10/18/2014 15:14:09 ET JOB#: [id number]  cc: Wyonia Hough, MD, <Dictator> Leandrew Koyanagi MD ELECTRONICALLY SIGNED 10/24/2014 13:03

## 2015-01-23 ENCOUNTER — Encounter
Admission: RE | Disposition: A | Discharge status: Home / Self Care | Payer: Self-pay | Source: Ambulatory Visit | Attending: Internal Medicine

## 2015-01-23 ENCOUNTER — Ambulatory Visit: Admit: 2015-01-23 | Payer: Self-pay | Admitting: Internal Medicine

## 2015-01-23 ENCOUNTER — Ambulatory Visit: Payer: Medicare Other | Admitting: Anesthesiology

## 2015-01-23 ENCOUNTER — Ambulatory Visit
Admission: RE | Admit: 2015-01-23 | Discharge: 2015-01-23 | Disposition: A | Discharge status: Home / Self Care | Payer: Medicare Other | Source: Ambulatory Visit | Attending: Internal Medicine | Admitting: Internal Medicine

## 2015-01-23 DIAGNOSIS — K745 Biliary cirrhosis, unspecified: Secondary | ICD-10-CM | POA: Insufficient documentation

## 2015-01-23 DIAGNOSIS — Z853 Personal history of malignant neoplasm of breast: Secondary | ICD-10-CM | POA: Insufficient documentation

## 2015-01-23 DIAGNOSIS — K219 Gastro-esophageal reflux disease without esophagitis: Secondary | ICD-10-CM | POA: Insufficient documentation

## 2015-01-23 DIAGNOSIS — Z952 Presence of prosthetic heart valve: Secondary | ICD-10-CM | POA: Diagnosis not present

## 2015-01-23 DIAGNOSIS — Z807 Family history of other malignant neoplasms of lymphoid, hematopoietic and related tissues: Secondary | ICD-10-CM | POA: Diagnosis not present

## 2015-01-23 DIAGNOSIS — Z87891 Personal history of nicotine dependence: Secondary | ICD-10-CM | POA: Diagnosis not present

## 2015-01-23 DIAGNOSIS — E785 Hyperlipidemia, unspecified: Secondary | ICD-10-CM | POA: Insufficient documentation

## 2015-01-23 DIAGNOSIS — Z79899 Other long term (current) drug therapy: Secondary | ICD-10-CM | POA: Diagnosis not present

## 2015-01-23 DIAGNOSIS — Z800 Family history of malignant neoplasm of digestive organs: Secondary | ICD-10-CM | POA: Insufficient documentation

## 2015-01-23 DIAGNOSIS — Z7952 Long term (current) use of systemic steroids: Secondary | ICD-10-CM | POA: Insufficient documentation

## 2015-01-23 DIAGNOSIS — F419 Anxiety disorder, unspecified: Secondary | ICD-10-CM | POA: Insufficient documentation

## 2015-01-23 DIAGNOSIS — I480 Paroxysmal atrial fibrillation: Secondary | ICD-10-CM | POA: Diagnosis present

## 2015-01-23 DIAGNOSIS — I5032 Chronic diastolic (congestive) heart failure: Secondary | ICD-10-CM | POA: Diagnosis not present

## 2015-01-23 DIAGNOSIS — E559 Vitamin D deficiency, unspecified: Secondary | ICD-10-CM | POA: Insufficient documentation

## 2015-01-23 DIAGNOSIS — I4892 Unspecified atrial flutter: Secondary | ICD-10-CM | POA: Diagnosis present

## 2015-01-23 DIAGNOSIS — Z803 Family history of malignant neoplasm of breast: Secondary | ICD-10-CM | POA: Insufficient documentation

## 2015-01-23 DIAGNOSIS — Z7901 Long term (current) use of anticoagulants: Secondary | ICD-10-CM | POA: Diagnosis not present

## 2015-01-23 DIAGNOSIS — E538 Deficiency of other specified B group vitamins: Secondary | ICD-10-CM | POA: Insufficient documentation

## 2015-01-23 DIAGNOSIS — Z818 Family history of other mental and behavioral disorders: Secondary | ICD-10-CM | POA: Insufficient documentation

## 2015-01-23 DIAGNOSIS — I4891 Unspecified atrial fibrillation: Secondary | ICD-10-CM | POA: Insufficient documentation

## 2015-01-23 DIAGNOSIS — Q245 Malformation of coronary vessels: Secondary | ICD-10-CM | POA: Insufficient documentation

## 2015-01-23 DIAGNOSIS — Z7982 Long term (current) use of aspirin: Secondary | ICD-10-CM | POA: Insufficient documentation

## 2015-01-23 DIAGNOSIS — N183 Chronic kidney disease, stage 3 (moderate): Secondary | ICD-10-CM | POA: Diagnosis not present

## 2015-01-23 DIAGNOSIS — Z8249 Family history of ischemic heart disease and other diseases of the circulatory system: Secondary | ICD-10-CM | POA: Diagnosis not present

## 2015-01-23 DIAGNOSIS — J449 Chronic obstructive pulmonary disease, unspecified: Secondary | ICD-10-CM | POA: Insufficient documentation

## 2015-01-23 DIAGNOSIS — I129 Hypertensive chronic kidney disease with stage 1 through stage 4 chronic kidney disease, or unspecified chronic kidney disease: Secondary | ICD-10-CM | POA: Insufficient documentation

## 2015-01-23 HISTORY — PX: ELECTROPHYSIOLOGIC STUDY: SHX172A

## 2015-01-23 LAB — PROTIME-INR
INR: 4.79
Prothrombin Time: 44.7 seconds — ABNORMAL HIGH (ref 11.4–15.0)

## 2015-01-23 SURGERY — CARDIOVERSION (CATH LAB)
Anesthesia: Moderate Sedation

## 2015-01-23 SURGERY — CARDIOVERSION (CATH LAB)
Anesthesia: General

## 2015-01-23 MED ORDER — LIDOCAINE HCL (CARDIAC) 20 MG/ML IV SOLN
INTRAVENOUS | Status: DC | PRN
  Administered 2015-01-23: 40 mg via INTRAVENOUS

## 2015-01-23 MED ORDER — PROPOFOL 10 MG/ML IV BOLUS
INTRAVENOUS | Status: DC | PRN
  Administered 2015-01-23: 20 mg via INTRAVENOUS
  Administered 2015-01-23: 30 mg via INTRAVENOUS

## 2015-01-23 MED ORDER — SODIUM CHLORIDE 0.9 % IV SOLN
INTRAVENOUS | Status: DC | PRN
  Administered 2015-01-23: 07:00:00 via INTRAVENOUS

## 2015-01-23 MED ORDER — MIDAZOLAM HCL 2 MG/2ML IJ SOLN
INTRAMUSCULAR | Status: DC | PRN
  Administered 2015-01-23: 2 mg via INTRAVENOUS

## 2015-01-23 NOTE — Anesthesia Postprocedure Evaluation (Signed)
  Anesthesia Post-op Note  Patient: Adriana Martin  Procedure(s) Performed: Procedure(s): CARDIOVERSION (N/A)  Anesthesia type:General  Patient location: PACU  Post pain: Pain level controlled  Post assessment: Post-op Vital signs reviewed, Patient's Cardiovascular Status Stable, Respiratory Function Stable, Patent Airway and No signs of Nausea or vomiting  Post vital signs: Reviewed and stable  Last Vitals:  Filed Vitals:   01/23/15 0835  BP:   Resp: 19    Level of consciousness: awake, alert  and patient cooperative  Complications: No apparent anesthesia complications

## 2015-01-23 NOTE — Anesthesia Preprocedure Evaluation (Addendum)
Anesthesia Evaluation  Patient identified by MRN, date of birth, ID band Patient awake    Reviewed: reviewed documented beta blocker date and time   Airway Mallampati: II       Dental   Pulmonary neg pulmonary ROS, asthma , COPD breath sounds clear to auscultation        Cardiovascular Exercise Tolerance: Poor hypertension, Pt. on home beta blockers + DOE + Valvular Problems/Murmurs MR Rhythm:Irregular     Neuro/Psych negative neurological ROS  negative psych ROS   GI/Hepatic hiatal hernia, GERD-  ,(+) Cirrhosis -      ,   Endo/Other  negative endocrine ROS  Renal/GU CRFRenal disease  negative genitourinary   Musculoskeletal negative musculoskeletal ROS (+)   Abdominal Normal abdominal exam  (+)   Peds negative pediatric ROS (+)  Hematology negative hematology ROS (+)   Anesthesia Other Findings   Reproductive/Obstetrics negative OB ROS                            Anesthesia Physical Anesthesia Plan  ASA: III  Anesthesia Plan: General   Post-op Pain Management:    Induction: Intravenous  Airway Management Planned: Natural Airway and Nasal Cannula  Additional Equipment:   Intra-op Plan:   Post-operative Plan:   Informed Consent: I have reviewed the patients History and Physical, chart, labs and discussed the procedure including the risks, benefits and alternatives for the proposed anesthesia with the patient or authorized representative who has indicated his/her understanding and acceptance.     Plan Discussed with: CRNA and Surgeon  Anesthesia Plan Comments:         Anesthesia Quick Evaluation

## 2015-01-23 NOTE — Transfer of Care (Signed)
Immediate Anesthesia Transfer of Care Note  Patient: Adriana Martin  Procedure(s) Performed: Procedure(s): CARDIOVERSION (N/A)  Patient Location: PACU and Short Stay  Anesthesia Type:General  Level of Consciousness: awake, alert , oriented and patient cooperative  Airway & Oxygen Therapy: Patient Spontanous Breathing and Patient connected to nasal cannula oxygen  Post-op Assessment: Report given to RN, Post -op Vital signs reviewed and stable and Patient moving all extremities  Post vital signs: Reviewed and stable  Last Vitals:  Filed Vitals:   01/23/15 0835  BP:   Resp: 19    Complications: No apparent anesthesia complications

## 2015-01-23 NOTE — OR Nursing (Signed)
[facility] go live; had issues getting patient procedure log access so some documentation on down time forms; sent to HIM to be scanned in with rest of chart

## 2015-01-23 NOTE — CV Procedure (Signed)
Electrical Cardioversion Procedure Note Adriana Martin [id number] Dec 24, 1945  Procedure: Electrical Cardioversion Indications:  Atrial Flutter  Procedure Details Consent: Risks of procedure as well as the alternatives and risks of each were explained to the (patient/caregiver).  Consent for procedure obtained. Time Out: Verified patient identification, verified procedure, site/side was marked, verified correct patient position, special equipment/implants available, medications/allergies/relevent history reviewed, required imaging and test results available.  Performed  Patient placed on cardiac monitor, pulse oximetry, supplemental oxygen as necessary.  Sedation given: Benzodiazepines and Short-acting barbiturates Pacer pads placed anterior and posterior chest.  Cardioverted 1 time(s).  Cardioverted at 100j.  Evaluation Findings: Post procedure EKG shows: NSR Complications: None Patient did tolerate procedure well.   Corey Skains 01/23/2015, 8:40 AM

## 2015-02-01 ENCOUNTER — Other Ambulatory Visit: Payer: Self-pay | Admitting: *Deleted

## 2015-02-01 DIAGNOSIS — D649 Anemia, unspecified: Secondary | ICD-10-CM | POA: Insufficient documentation

## 2015-02-01 DIAGNOSIS — D75839 Thrombocytosis, unspecified: Secondary | ICD-10-CM

## 2015-02-01 DIAGNOSIS — D473 Essential (hemorrhagic) thrombocythemia: Secondary | ICD-10-CM | POA: Insufficient documentation

## 2015-02-05 ENCOUNTER — Inpatient Hospital Stay: Payer: Medicare Other | Attending: Internal Medicine

## 2015-02-05 ENCOUNTER — Inpatient Hospital Stay (HOSPITAL_BASED_OUTPATIENT_CLINIC_OR_DEPARTMENT_OTHER): Payer: Medicare Other | Admitting: Internal Medicine

## 2015-02-05 ENCOUNTER — Other Ambulatory Visit: Payer: Self-pay | Admitting: Internal Medicine

## 2015-02-05 ENCOUNTER — Encounter: Payer: Self-pay | Admitting: Internal Medicine

## 2015-02-05 ENCOUNTER — Other Ambulatory Visit: Payer: Self-pay | Admitting: *Deleted

## 2015-02-05 ENCOUNTER — Ambulatory Visit: Payer: Self-pay | Admitting: Radiation Oncology

## 2015-02-05 VITALS — BP 96/53 | HR 83 | Temp 95.6°F | Resp 19 | Ht 62.0 in | Wt 196.1 lb

## 2015-02-05 DIAGNOSIS — I10 Essential (primary) hypertension: Secondary | ICD-10-CM

## 2015-02-05 DIAGNOSIS — Z170 Estrogen receptor positive status [ER+]: Secondary | ICD-10-CM | POA: Diagnosis not present

## 2015-02-05 DIAGNOSIS — D472 Monoclonal gammopathy: Secondary | ICD-10-CM

## 2015-02-05 DIAGNOSIS — I4891 Unspecified atrial fibrillation: Secondary | ICD-10-CM | POA: Insufficient documentation

## 2015-02-05 DIAGNOSIS — Z79899 Other long term (current) drug therapy: Secondary | ICD-10-CM | POA: Diagnosis not present

## 2015-02-05 DIAGNOSIS — D473 Essential (hemorrhagic) thrombocythemia: Secondary | ICD-10-CM

## 2015-02-05 DIAGNOSIS — C50919 Malignant neoplasm of unspecified site of unspecified female breast: Secondary | ICD-10-CM

## 2015-02-05 DIAGNOSIS — D649 Anemia, unspecified: Secondary | ICD-10-CM

## 2015-02-05 DIAGNOSIS — Z79811 Long term (current) use of aromatase inhibitors: Secondary | ICD-10-CM

## 2015-02-05 DIAGNOSIS — D75839 Thrombocytosis, unspecified: Secondary | ICD-10-CM

## 2015-02-05 LAB — CREATININE, SERUM
Creatinine, Ser: 1.65 mg/dL — ABNORMAL HIGH (ref 0.44–1.00)
GFR calc Af Amer: 36 mL/min — ABNORMAL LOW (ref >60)
GFR, EST NON AFRICAN AMERICAN: 31 mL/min — AB (ref >60)

## 2015-02-05 LAB — CBC
HCT: 33.6 % — ABNORMAL LOW (ref 35.0–47.0)
Hemoglobin: 10.8 g/dL — ABNORMAL LOW (ref 12.0–16.0)
MCH: 27.1 pg (ref 26.0–34.0)
MCHC: 32.1 g/dL (ref 32.0–36.0)
MCV: 84.3 fL (ref 80.0–100.0)
PLATELETS: 534 10*3/uL — AB (ref 150–440)
RBC: 3.98 MIL/uL (ref 3.80–5.20)
RDW: 17.3 % — ABNORMAL HIGH (ref 11.5–14.5)
WBC: 8.3 10*3/uL (ref 3.6–11.0)

## 2015-02-05 NOTE — Progress Notes (Signed)
[place] note      This 69 y.o. 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 Workup in the past included normal iron studies, normal electrophoresis, normal abdominal ultrasound, and negative CML blood testing, in 2001 AND now in 2012, ., normal JAK 2 in 09,  8/201 codon 12 neg                                                                                                                        3.Iron deficiency documented THEN GI w/u done, EGD and colon normal 01/2011                                                        4. Breast mass, invasive cancer dx by core bx 01/28/11, 0.7 cm, grade 2, er pos, pr pos, Her 2 not amplified, NX, no nodes clinically palpable, now s/p lumpectony and sentinal node 02/18/11, 1.1cm, grade 2, node neg of 1 sampled , T1c, N0 MO stage IB. Oncotype low risk                                                                                                                        5.  Elevated LFT, INITIALLY SEEN 01/2011, ,contracted GB, initially  high alk phos and enzymes mild elevated, then lft higher, now s/p liver bx, evidence of early biliary cirrhosis    6. Enlarged  lymph nodes retroperitoneal at pancreatic head,  s/p evaluation by EUS, plus bx , non malignant, inflammatory lymphs seen, suboptimal for a flow cytometry analysis, also PET/CT done, Stable on serial CT last 02/2013, then d/c serial scans.     7.Prominent ovaries, on CT, pelvic U/S reports normal, CA 125 mild elevated at 45, later down to normal, CT PELVIS NEG 04/2012     8 Headaches, plus elevated  ESR,  temporal bx neg 9/10, headache gone   9.  Prior weakness right leg, ,  plus PET fining of inflammation along sciatic nerve left,  hx of trauma  asymptomatic, normal exam on f/u...10. Afib/flutter 03/2013, prior on coumadin, now amiodorone and diltiezam  11. Small m spike noted 08/2012  12 Colitis 03/2013   HPI: SEE 10/17/13 NOTE FOR SUMMARY PRIOR EVENTS . see also 1/5 and 2/23 note, re recent issues..following up wax and wane hgb, will repeat electropheresis,will watch cre, titate hydrea prn, following breast cancer, continuing aromatase inhibitor, check mamograms due 05/2015, monitor bone density will be done by dr Dorthula Perfect 02/2015, note coumadin followew by Dr Nehemiah Massed, and yearly breast exam with Dr Baruch Gouty due 01/2015 .Marland KitchenRE IRON STUDIES, as priior noted, iron sat was 12% in  feb/2012, with ferritin 297, then 38% with ferritin 529 in 04/2011 .Marland KitchenColonoscopy and Egd done 01/2011 and EGD in 04/2011. Stools were neg 08/2011. Then iron sat 11% in 02/2014, , and now iron sat 16% 11/13/14, 3 stools neg 09/30/14 and 3 stools neg 11/19/14    TODAY FOR  F/U , NEW SINCE 2/23 VISIT IS 3 NEG STOOLS 2/29. HAD RECENT CATARACT AND HAS OTHER IDE SURGERY PLANNED NEXT WK. IS UP TO DATE WITH CARDIOLOGY AND COUMADIN MONITORING . IS UP TO DATE WITH GI AND LFT MONITORING     NO ACUTE COMPLAINTS, TOLERATING ARIMIDEX. NO FEVER, CHILLS SWEATS. NO LEG WEAKNESS OR BACK PAIN, SOME ARTHRITIC STIFFNESS SHOULDER, WAX AND WANE,   NO OTHER BONE PAIN,. AS PRIOR NOTED,  ON B12 INJ WITH GI, HAD MRI ABDO IN APRIL/2015, HAD MAMMOGRAM 02/2014,        TODAY NO ACUTE COMPLAINTS, AFTER LAST VISIT SIEP AND LT CHAINS RECHECKED, TODAY CBC AND CRE CHECKED. HAS BEEN TO ER WITH AFIB RECENTLY, HAS APPT DR CHRYSTAL THIS WEEK, HE WILL PERFORMM CLINICAL BREAST EXAM . STILL PENDING IS MAMMIGRAM AND BONE DENSITY TEST DUE June WILL BE SCHEDULED THROUGH DR THEIS    Review of Systems:  General: No acute distress, No  fever chills or sweats   HEENT: No headache, dizziness, ear  or jaw pain, or epistaxis   Lungs: No cough, shortness of breath, at rest, , No wheezing, No chest pain, No, hemoptysis  Cardiac: no chest pain, no palpitations, no orthostasis, no lower extremity    GI: no abdominal pain, nausea, vomiting, diahrrea, or reflux   GU: no dysuria, no hematuria, no vaginal bleeding  Musculoskeletal: no back pain,  no acutely painful joints,   Extremities: no upper or lower extremity edema  Skin: no bruising, no rash  Neuro: no headache, no dizzy, no focal weakness   Psych: no anxiety, no depression   Allergies Allergies  Allergen Reactions  . Atorvastatin     Other reaction(s): Unknown  . Calcium Carbonate     Other reaction(s): Unknown  . Fluoxetine     Other reaction(s): Unknown  . Hydrochlorothiazide     Other reaction(s): Unknown    Significant History/PMH: Past Medical History  Diagnosis Date  . Hepatitis   . Atrial fibrillation   . Breast cancer   . Hyperlipemia   . Hypertension    Past Surgical History  Procedure Laterality Date  . Abdominal hysterectomy      Partial            Smoking History:, Past Smoker quit 2000,  Still drinks one or 2 beers a day  PFSH: father myeloma brother esophagus, mother rcent dx at age 71 breast cancer Family History:  Family History  Problem Relation Age of Onset  . Cancer Mother   .  Cancer Father   . Cancer Brother     Comments:   Social History:  History  Alcohol Use  . 2.4 oz/week  . 4 Cans of beer per week    Additional Past Medical and Surgical History:    Home Medications: Prior to Admission medications   Medication Sig Start Date End Date Taking? Authorizing Provider  ADVAIR DISKUS 100-50 MCG/DOSE AEPB  10/29/14  Yes Historical Provider, MD  ALPRAZolam Duanne Moron) 0.5 MG tablet Take 0.5 mg by mouth 2 (two) times daily as needed for anxiety.   Yes Historical Provider, MD  amiodarone (PACERONE) 200 MG tablet Take 200 mg by mouth daily.   Yes Historical Provider, MD  anastrozole  (ARIMIDEX) 1 MG tablet Take 1 mg by mouth daily.   Yes Historical Provider, MD  aspirin 81 MG tablet Take 81 mg by mouth daily.   Yes Historical Provider, MD  cyanocobalamin (,VITAMIN B-12,) 1000 MCG/ML injection Inject into the muscle. 11/27/14 11/22/15 Yes Historical Provider, MD  ergocalciferol (VITAMIN D2) 50000 UNITS capsule Take 50,000 Units by mouth every 30 (thirty) days.   Yes Historical Provider, MD  furosemide (LASIX) 20 MG tablet Take by mouth. 06/11/14 06/11/15 Yes Historical Provider, MD  hydroxyurea (HYDREA) 500 MG capsule Take 500 mg by mouth daily. May take with food to minimize GI side effects.   Yes Historical Provider, MD  metoprolol succinate (TOPROL-XL) 50 MG 24 hr tablet Take 50 mg by mouth daily. Take with or immediately following a meal.   Yes Historical Provider, MD  mirtazapine (REMERON) 15 MG tablet Take 15 mg by mouth at bedtime.   Yes Historical Provider, MD  Multiple Vitamins-Minerals (CENTRUM SILVER ULTRA WOMENS) TABS Take 1 tablet by mouth daily.   Yes Historical Provider, MD  potassium chloride SA (K-DUR,KLOR-CON) 20 MEQ tablet Take 20 mEq by mouth daily.   Yes Historical Provider, MD  predniSONE (DELTASONE) 5 MG tablet Take 5 mg by mouth daily with breakfast.   Yes Historical Provider, MD  PROAIR HFA 108 (90 BASE) MCG/ACT inhaler  10/29/14  Yes Historical Provider, MD  Probiotic Product ([place]) CAPS Take 1 capsule by mouth daily.   Yes Historical Provider, MD  ursodiol (ACTIGALL) 300 MG capsule Take 300 mg by mouth 2 (two) times daily.   Yes Historical Provider, MD  warfarin (COUMADIN) 2.5 MG tablet Take 2.5 mg by mouth daily.   Yes Historical Provider, MD  ZETIA 10 MG tablet  01/31/15  Yes Historical Provider, MD  aspirin EC 81 MG tablet Take by mouth. 05/07/14   Historical Provider, MD  ketorolac (ACULAR) 0.4 % SOLN  12/12/14   Historical Provider, MD  lisinopril (PRINIVIL,ZESTRIL) 5 MG tablet Take 5 mg by mouth daily.    Historical Provider, MD  ofloxacin  (OCUFLOX) 0.3 % ophthalmic solution  12/12/14   Historical Provider, MD  prednisoLONE acetate (PRED FORTE) 1 % ophthalmic suspension  12/12/14   Historical Provider, MD    Vital Signs:  Blood pressure 96/53, pulse 83, temperature 95.6 F (35.3 C), temperature source Oral, resp. rate 19, height 5' 2" (1.575 m), weight 196 lb 1.6 oz (88.95 kg).  Physical Exam:  General: well developed, well nourished, and no acute distress  Mental Status: alert and oriented to person, place and time  Head, Ears, Nose,Throat: No thrush  Respiratory: no rales, rhonchi, or wheezing,     Gastrointestinal: soft, non tender, no masses or organomegaly    Skin: no rashes, no bruises  Neurological: No gross focal  weakness cranial nerves intact  Lymphatics: Not palpable, neck supraclavicular, submandibular, axilla    Psych: Mood, Affect, Unremarkable    Laboratory Results: Appointment on 02/05/2015  Component Date Value Ref Range Status  . WBC 02/05/2015 8.3  3.6 - 11.0 K/uL Final  . RBC 02/05/2015 3.98  3.80 - 5.20 MIL/uL Final  . Hemoglobin 02/05/2015 10.8* 12.0 - 16.0 g/dL Final  . HCT 02/05/2015 33.6* 35.0 - 47.0 % Final  . MCV 02/05/2015 84.3  80.0 - 100.0 fL Final  . MCH 02/05/2015 27.1  26.0 - 34.0 pg Final  . MCHC 02/05/2015 32.1  32.0 - 36.0 g/dL Final  . RDW 02/05/2015 17.3* 11.5 - 14.5 % Final  . Platelets 02/05/2015 534* 150 - 440 K/uL Final  . Creatinine, Ser 02/05/2015 1.65* 0.44 - 1.00 mg/dL Final  . GFR calc non Af Amer 02/05/2015 31* >60 mL/min Final  . GFR calc Af Amer 02/05/2015 36* >60 mL/min Final   Comment: (NOTE) The eGFR has been calculated using the CKD EPI equation. This calculation has not been validated in all clinical situations. eGFR's persistently <60 mL/min signify possible Chronic Kidney Disease.           Radiology Results: No results found.         Assessment and Plan: Impression: 1Lleukocytes prior  BCR/CML WAS neg 01/2011,   2. Hgb prior low, stable,. prior  low iron studies, SIEP and UIEP unremarkable GI w/u has been done 2012,  plts chronically mild high PRIOR over 50mll and hydrea started   3.Cre prwax and wane,    4. Breast cancer has completed focal/mammosite radiation tx. 5 .As prior noted   04/17/11 CT ABDO AND PELVIS DONE, 8/12 PET DONE, ABNORMAL LYMPH NODES, LIKELY REACTIVE, BX DONE, THEN F/U ON SCAN  04/2012 STABLE ALSO CONTRACTED GB WITHOUT ANY SYMPTOMS, 6 PROMINENT ADNEXA ON CT, U/S NORMAL, ca 125 mild high then normal, Most recent CT 02/2013.  7. As prior noted  plan systemic adjuvant  tx. Aromatase inhibitor tx for 5 yrs will be indicated. Chemotx was considered with tumor more than 1 cm  so oncotype done, AS PRIOR NOTED, PATIENT DECIDING AGAINST SYSTEMIC CHEMOTX....Marland Kitchen. As priro reviewd Headaches, possible tension, depression,  with high sed rate, frontal and temproal headache, temporal bx neg, headaches resolved. 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 Prior  Abnormal PET/ inflammed sciatic nerve, never clinical issue,   11.  Depression well compensated  12. Monoclonal gammopathy,     TODAY, CLINICALLY STABLE, LAST M SPIKE AT 200, LT CHAINS NORMAL, HGB STABLE, MILD ANEMIA, PLTS ALIGHTLY HIGHER, PROTIME FOLLOWED BY DR KNehemiah Massed DUE FOR UPCOMMING BONE DENSITY AND MAMMOGRAM   Plan:   Plan   1. Continue arimidex,  prior discussed side effect profile including but not limited to muscle and bone pain, ostporosis, nausea, female pattern hair growth, adverese effects on choesterol  2 MAMMOGRAM YEARLY DUE 02/2014 PATIENT PLANS TO SCHEDULE WITH DR THEIS  3.  CLINICAL BREAST EXAMS YEARLY BY RADIATION ONCOLOGY DUE 01/2015.  4.  ALSO GI  F/U FOR IMAGING PRN AND LFTS PRN,   5.AND CARDIOLOGY F/U, PRN, FOR CLINICAL F/U AND COUMADIN MAINTAINANCE  6. RECOMMEND YEARLY BONE  DENSITY AS ON AROMATASE INHIBITOR, NOW DUE 02/2014,  HAVE PRIOR DISCUSSED  WITH DR THEIS, WILL BE DONE AT [place] ,  SEE ALSO CURRENT ILLNESS AND IMP. I WILL F/U CBC, TITRATE HYDREA PRN, CONTINUE AROMATASE INHIBITOR, HAVE CHECKED STOOLS AND IRON SAT, WILL DISCUSS POSSIBLE W/U OF LOW IRON WITH DR OH         HAS F/U WITH DR CHRYSTAL THIS WEEK FOR BREAST EXAM. I WILL REPEAT CBC AND CRE IN 1 WEEK, IF BACK TO NORMAL THEN F/U 8 WEEKS, SOONER PRN IF ABNORMAL, WILL CHECK MAMMOGRAM AND BONE DENSITY AND DISCUSS WITH DR [facility]

## 2015-02-06 NOTE — Anesthesia Postprocedure Evaluation (Signed)
  Anesthesia Post-op Note  Patient: Adriana Martin  Procedure(s) Performed: Procedure(s): CARDIOVERSION (N/A)  Anesthesia type:General  Patient location: PACU  Post pain: Pain level controlled  Post assessment: Post-op Vital signs reviewed, Patient's Cardiovascular Status Stable, Respiratory Function Stable, Patent Airway and No signs of Nausea or vomiting  Post vital signs: Reviewed and stable  Last Vitals:  Filed Vitals:   01/23/15 0835  BP:   Resp: 19    Level of consciousness: awake, alert  and patient cooperative  Complications: No apparent anesthesia complications

## 2015-02-08 ENCOUNTER — Encounter: Payer: Self-pay | Admitting: Radiation Oncology

## 2015-02-08 ENCOUNTER — Ambulatory Visit
Admission: RE | Admit: 2015-02-08 | Discharge: 2015-02-08 | Disposition: A | Discharge status: Home / Self Care | Payer: Medicare Other | Source: Ambulatory Visit | Attending: Radiation Oncology | Admitting: Radiation Oncology

## 2015-02-08 VITALS — BP 114/74 | HR 88 | Temp 97.8°F | Resp 20 | Wt 200.2 lb

## 2015-02-08 DIAGNOSIS — C50911 Malignant neoplasm of unspecified site of right female breast: Secondary | ICD-10-CM

## 2015-02-08 NOTE — Progress Notes (Signed)
Radiation Oncology Follow up Note  Name: Adriana Martin   Date:   02/08/2015 MRN:  [id number] DOB: 1945/11/24    This 69 y.o. 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 She is seen today in routine follow-up and is doing well. She has thrombocytosis is being followed by medical oncology for that. She specifically denies breast tenderness cough or bone pain.Marland Kitchen  REFERRING PROVIDER: [name], MD  HPI: As above.  COMPLICATIONS OF TREATMENT: none  FOLLOW UP COMPLIANCE: keeps appointments   PHYSICAL EXAM:  BP 114/74 mmHg  Pulse 88  Temp(Src) 97.8 F (36.6 C)  Resp 20  Wt 200 lb 2.8 oz (90.8 kg) Well-developed female in NAD.Lungs are clear to A&P cardiac examination essentially unremarkable with regular rate and rhythm. No dominant mass or nodularity is noted in either breast in 2 positions examined. Incision is well-healed. No axillary or supraclavicular adenopathy is appreciated. Cosmetic result is excellent. Well-developed well-nourished patient in NAD. HEENT reveals PERLA, EOMI, discs not visualized.  Oral cavity is clear. No oral mucosal lesions are identified. Neck is clear without evidence of cervical or supraclavicular adenopathy. Lungs are clear to A&P. Cardiac examination is essentially unremarkable with regular rate and rhythm without murmur rub or thrill. Abdomen is benign with no organomegaly or masses noted. Motor sensory and DTR levels are equal and symmetric in the upper and lower extremities. Cranial nerves II through XII are grossly intact. Proprioception is intact. No peripheral adenopathy or edema is identified. No motor or sensory levels are noted. Crude visual fields are within normal range.   RADIOLOGY RESULTS: Prior mammograms are reviewed  PLAN: At the present  time from a breast cancer standpoint she continues to do well with no evidence of disease. She continues close follow-up care with medical oncology for thrombocytosis. She has decided against systemic chemotherapy for that problem. She's currently on aromatase tolerate that well without side effect. I'm going to discontinue follow-up care at this point since were close to 5 years out. We'll be happy to reevaluate the patient any time should further treatment be indicated.  I would like to take this opportunity for allowing me to participate in the care of your patient.Armstead Peaks., MD

## 2015-02-12 ENCOUNTER — Inpatient Hospital Stay: Payer: Medicare Other

## 2015-02-12 DIAGNOSIS — C50919 Malignant neoplasm of unspecified site of unspecified female breast: Secondary | ICD-10-CM | POA: Diagnosis not present

## 2015-02-12 DIAGNOSIS — D649 Anemia, unspecified: Secondary | ICD-10-CM

## 2015-02-12 LAB — CBC WITH DIFFERENTIAL/PLATELET
Basophils Absolute: 0.1 10*3/uL (ref 0–0.1)
Basophils Relative: 2 %
Eosinophils Absolute: 0.2 10*3/uL (ref 0–0.7)
Eosinophils Relative: 2 %
HCT: 31.4 % — ABNORMAL LOW (ref 35.0–47.0)
Hemoglobin: 10.2 g/dL — ABNORMAL LOW (ref 12.0–16.0)
Lymphocytes Relative: 11 %
Lymphs Abs: 1 10*3/uL (ref 1.0–3.6)
MCH: 27 pg (ref 26.0–34.0)
MCHC: 32.3 g/dL (ref 32.0–36.0)
MCV: 83.7 fL (ref 80.0–100.0)
MONOS PCT: 9 %
Monocytes Absolute: 0.8 10*3/uL (ref 0.2–0.9)
NEUTROS PCT: 76 %
Neutro Abs: 6.8 10*3/uL — ABNORMAL HIGH (ref 1.4–6.5)
PLATELETS: 425 10*3/uL (ref 150–440)
RBC: 3.75 MIL/uL — ABNORMAL LOW (ref 3.80–5.20)
RDW: 17.2 % — AB (ref 11.5–14.5)
WBC: 9 10*3/uL (ref 3.6–11.0)

## 2015-02-12 LAB — CREATININE, SERUM
CREATININE: 1.59 mg/dL — AB (ref 0.44–1.00)
GFR calc Af Amer: 37 mL/min — ABNORMAL LOW (ref >60)
GFR calc non Af Amer: 32 mL/min — ABNORMAL LOW (ref >60)

## 2015-03-26 ENCOUNTER — Other Ambulatory Visit: Payer: Self-pay | Admitting: *Deleted

## 2015-03-27 ENCOUNTER — Telehealth: Payer: Self-pay | Admitting: *Deleted

## 2015-03-27 NOTE — Telephone Encounter (Addendum)
A refill for a 1 month supply of Hydroxyurea 500 mg capsules was faxed to [facility] in [place]. Pt has a f/u visit with Georgeanne Nim, AGNP-C on 04/02/15.Marland KitchenMarland Kitchen

## 2015-04-02 ENCOUNTER — Inpatient Hospital Stay: Payer: Medicare Other | Attending: Family Medicine

## 2015-04-02 ENCOUNTER — Inpatient Hospital Stay (HOSPITAL_BASED_OUTPATIENT_CLINIC_OR_DEPARTMENT_OTHER): Payer: Medicare Other | Admitting: Family Medicine

## 2015-04-02 VITALS — BP 122/56 | HR 92 | Temp 97.8°F | Resp 16 | Wt 204.1 lb

## 2015-04-02 DIAGNOSIS — D472 Monoclonal gammopathy: Secondary | ICD-10-CM

## 2015-04-02 DIAGNOSIS — D473 Essential (hemorrhagic) thrombocythemia: Secondary | ICD-10-CM | POA: Insufficient documentation

## 2015-04-02 DIAGNOSIS — I4891 Unspecified atrial fibrillation: Secondary | ICD-10-CM

## 2015-04-02 DIAGNOSIS — Z170 Estrogen receptor positive status [ER+]: Secondary | ICD-10-CM | POA: Insufficient documentation

## 2015-04-02 DIAGNOSIS — I10 Essential (primary) hypertension: Secondary | ICD-10-CM

## 2015-04-02 DIAGNOSIS — Z87891 Personal history of nicotine dependence: Secondary | ICD-10-CM | POA: Insufficient documentation

## 2015-04-02 DIAGNOSIS — C50911 Malignant neoplasm of unspecified site of right female breast: Secondary | ICD-10-CM

## 2015-04-02 DIAGNOSIS — D649 Anemia, unspecified: Secondary | ICD-10-CM

## 2015-04-02 DIAGNOSIS — C50919 Malignant neoplasm of unspecified site of unspecified female breast: Secondary | ICD-10-CM

## 2015-04-02 DIAGNOSIS — Z79811 Long term (current) use of aromatase inhibitors: Secondary | ICD-10-CM

## 2015-04-02 LAB — CREATININE, SERUM
CREATININE: 1.68 mg/dL — AB (ref 0.44–1.00)
GFR calc Af Amer: 35 mL/min — ABNORMAL LOW (ref >60)
GFR, EST NON AFRICAN AMERICAN: 30 mL/min — AB (ref >60)

## 2015-04-02 LAB — CBC WITH DIFFERENTIAL/PLATELET
Basophils Absolute: 0.1 10*3/uL (ref 0–0.1)
Basophils Relative: 1 %
EOS PCT: 1 %
Eosinophils Absolute: 0.1 10*3/uL (ref 0–0.7)
HCT: 32.2 % — ABNORMAL LOW (ref 35.0–47.0)
Hemoglobin: 10.2 g/dL — ABNORMAL LOW (ref 12.0–16.0)
LYMPHS ABS: 0.5 10*3/uL — AB (ref 1.0–3.6)
LYMPHS PCT: 5 %
MCH: 26.9 pg (ref 26.0–34.0)
MCHC: 31.6 g/dL — ABNORMAL LOW (ref 32.0–36.0)
MCV: 85.1 fL (ref 80.0–100.0)
Monocytes Absolute: 0.3 10*3/uL (ref 0.2–0.9)
Monocytes Relative: 3 %
Neutro Abs: 8.8 10*3/uL — ABNORMAL HIGH (ref 1.4–6.5)
Neutrophils Relative %: 90 %
Platelets: 386 10*3/uL (ref 150–440)
RBC: 3.78 MIL/uL — ABNORMAL LOW (ref 3.80–5.20)
RDW: 20 % — AB (ref 11.5–14.5)
WBC: 9.8 10*3/uL (ref 3.6–11.0)

## 2015-04-02 NOTE — Progress Notes (Signed)
[place]  [phone]  [phone]     Adriana Martin DOB: 05-06-1946  MR#: [id number]  GNO#:[id number]  Patient Care Team: [name], MD as PCP - General (Internal Medicine)  CHIEF COMPLAINT:  Chief Complaint  Patient presents with  . Follow-up   Patient has a history of anemia, thrombocytosis, breast cancer from 2012 T1c, N0 M0, stage IB, monoclonal gammopathy, and multiple other chronic conditions.  INTERVAL HISTORY: Patient is here for continued follow-up and treatment consideration regarding several different medical issues. She has a history of borderline low anemia and she is currently stable. She also has a borderline thrombocytosis and Hydrea has been started. She is currently taking 500 mg once daily 6 days per week. She also has significant history for invasive breast cancer diagnosed in May 2012, ER positive PR positive HER-2 not amplified. Oncotype was low low risk for recurrence. She is currently on anastrozole daily. Tolerating very well. She has a mammogram that is due in September 2016, she is also due for bone density exam but she is refusing. 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 Also of clinical significance is a history of primary biliary cirrhosis and paroxysmal atrial fibrillation. Patient is overall doing well and denies any acute complaints at this time  REVIEW OF SYSTEMS:   Review of Systems  Constitutional: Positive for malaise/fatigue. Negative for fever, chills, weight loss and diaphoresis.  HENT: Negative for congestion, ear discharge, ear pain, hearing loss, nosebleeds, sore throat and tinnitus.   Eyes: Negative for blurred vision, double vision, photophobia, pain, discharge and redness.  Respiratory: Negative for cough, hemoptysis, sputum production, shortness of breath, wheezing and stridor.   Cardiovascular: Negative for chest pain, palpitations, orthopnea, claudication, leg swelling  and PND.  Gastrointestinal: Negative for heartburn, nausea, vomiting, abdominal pain, diarrhea, constipation, blood in stool and melena.  Genitourinary: Negative.   Musculoskeletal: Negative.   Skin: Negative.   Neurological: Negative for dizziness, tingling, focal weakness, seizures, weakness and headaches.  Endo/Heme/Allergies: Does not bruise/bleed easily.  Psychiatric/Behavioral: Negative for depression. The patient is not nervous/anxious and does not have insomnia.     As per HPI. Otherwise, a complete review of systems is negatve.  ONCOLOGY HISTORY:  No history exists.    PAST MEDICAL HISTORY: Past Medical History  Diagnosis Date  . Hepatitis   . Atrial fibrillation   . Breast cancer   . Hyperlipemia   . Hypertension     PAST SURGICAL HISTORY: Past Surgical History  Procedure Laterality Date  . Abdominal hysterectomy      Partial    FAMILY HISTORY Family History  Problem Relation Age of Onset  . Cancer Mother   . Cancer Father   . Cancer Brother     GYNECOLOGIC HISTORY:  No LMP recorded.     ADVANCED DIRECTIVES:    HEALTH MAINTENANCE: History  Substance Use Topics  . Smoking status: Former Smoker -- 0.25 packs/day    Types: Cigarettes    Quit date: 10/07/1998  . Smokeless tobacco: Not on file  . Alcohol Use: 2.4 oz/week    4 Cans of beer per week     Colonoscopy:  PAP:  Bone density:  Lipid panel:  Allergies  Allergen Reactions  . Atorvastatin     Other reaction(s): Unknown  . Calcium Carbonate     Other reaction(s): Unknown  . Fluoxetine     Other reaction(s): Unknown  . Hydrochlorothiazide     Other reaction(s): Unknown  Current Outpatient Prescriptions  Medication Sig Dispense Refill  . ADVAIR DISKUS 100-50 MCG/DOSE AEPB   0  . ALPRAZolam (XANAX) 0.5 MG tablet Take 0.5 mg by mouth 2 (two) times daily as needed for anxiety.    Marland Kitchen amiodarone (PACERONE) 200 MG tablet Take 200 mg by mouth daily.    Marland Kitchen anastrozole (ARIMIDEX) 1 MG  tablet Take 1 mg by mouth daily.    Marland Kitchen aspirin 81 MG tablet Take 81 mg by mouth daily.    . cyanocobalamin (,VITAMIN B-12,) 1000 MCG/ML injection Inject into the muscle.    . ergocalciferol (VITAMIN D2) 50000 UNITS capsule Take 50,000 Units by mouth every 30 (thirty) days.    . furosemide (LASIX) 20 MG tablet Take by mouth daily as needed.     . hydroxyurea (HYDREA) 500 MG capsule TK ONE C PO  PO 6 TIMES A WEEK  1  . lisinopril (PRINIVIL,ZESTRIL) 5 MG tablet Take 5 mg by mouth daily.    . metoprolol succinate (TOPROL-XL) 50 MG 24 hr tablet Take 50 mg by mouth daily. Take with or immediately following a meal.    . mirtazapine (REMERON) 15 MG tablet Take 15 mg by mouth at bedtime.    . Multiple Vitamins-Minerals (CENTRUM SILVER ULTRA WOMENS) TABS Take 1 tablet by mouth daily.    Marland Kitchen ofloxacin (OCUFLOX) 0.3 % ophthalmic solution   0  . potassium chloride SA (K-DUR,KLOR-CON) 20 MEQ tablet Take 20 mEq by mouth daily as needed (When taking furosemide.).     Marland Kitchen prednisoLONE acetate (PRED FORTE) 1 % ophthalmic suspension   0  . predniSONE (DELTASONE) 5 MG tablet Take 5 mg by mouth daily with breakfast.    . PROAIR HFA 108 (90 BASE) MCG/ACT inhaler   0  . Probiotic Product ([place]) CAPS Take 1 capsule by mouth daily.    . ursodiol (ACTIGALL) 300 MG capsule Take 300 mg by mouth 2 (two) times daily.    Marland Kitchen warfarin (COUMADIN) 2 MG tablet Take 2 mg by mouth daily.  5  . ZETIA 10 MG tablet   6  . aspirin EC 81 MG tablet Take by mouth.    Marland Kitchen ketorolac (ACULAR) 0.4 % SOLN   0   No current facility-administered medications for this visit.    OBJECTIVE: BP 122/56 mmHg  Pulse 92  Temp(Src) 97.8 F (36.6 C) (Tympanic)  Resp 16  Wt 204 lb 2.3 oz (92.599 kg)  SpO2 98%   Body mass index is 37.33 kg/(m^2).    ECOG FS:0 - Asymptomatic  General: Well-developed, well-nourished, no acute distress. Eyes: Pink conjunctiva, anicteric sclera. HEENT: Normocephalic, moist mucous membranes, clear  oropharnyx. Lungs: Clear to auscultation bilaterally. Heart: Regular rate and rhythm.  Abdomen: Soft, nontender, nondistended. No organomegaly noted, normoactive bowel sounds. Musculoskeletal: No edema, cyanosis, or clubbing. Neuro: Alert, answering all questions appropriately. Cranial nerves grossly intact. Skin: No rashes or petechiae noted. Psych: Normal affect.    LAB RESULTS:  Appointment on 04/02/2015  Component Date Value Ref Range Status  . WBC 04/02/2015 9.8  3.6 - 11.0 K/uL Final  . RBC 04/02/2015 3.78* 3.80 - 5.20 MIL/uL Final  . Hemoglobin 04/02/2015 10.2* 12.0 - 16.0 g/dL Final  . HCT 04/02/2015 32.2* 35.0 - 47.0 % Final  . MCV 04/02/2015 85.1  80.0 - 100.0 fL Final  . MCH 04/02/2015 26.9  26.0 - 34.0 pg Final  . MCHC 04/02/2015 31.6* 32.0 - 36.0 g/dL Final  . RDW 04/02/2015 20.0* 11.5 - 14.5 %  Final  . Platelets 04/02/2015 386  150 - 440 K/uL Final  . Neutrophils Relative % 04/02/2015 90   Final  . Neutro Abs 04/02/2015 8.8* 1.4 - 6.5 K/uL Final  . Lymphocytes Relative 04/02/2015 5   Final  . Lymphs Abs 04/02/2015 0.5* 1.0 - 3.6 K/uL Final  . Monocytes Relative 04/02/2015 3   Final  . Monocytes Absolute 04/02/2015 0.3  0.2 - 0.9 K/uL Final  . Eosinophils Relative 04/02/2015 1   Final  . Eosinophils Absolute 04/02/2015 0.1  0 - 0.7 K/uL Final  . Basophils Relative 04/02/2015 1   Final  . Basophils Absolute 04/02/2015 0.1  0 - 0.1 K/uL Final  . Creatinine, Ser 04/02/2015 1.68* 0.44 - 1.00 mg/dL Final  . GFR calc non Af Amer 04/02/2015 30* >60 mL/min Final  . GFR calc Af Amer 04/02/2015 35* >60 mL/min Final   Comment: (NOTE) The eGFR has been calculated using the CKD EPI equation. This calculation has not been validated in all clinical situations. eGFR's persistently <60 mL/min signify possible Chronic Kidney Disease.     STUDIES: No results found.  ASSESSMENT:  Breast cancer Anemia Thrombocytosis  PLAN:   1. Breast cancer. Initially stage Ib, ER/PR  positive HER-2 negative. Patient is currently on anastrozole and tolerating very well. Anastrozole will be completed after 5 years. There is clinically no evidence of recurrent disease. We will schedule for mammogram in September 2016. Patient is refusing any further bone density exams. Patient understands the risk of osteoporosis with use of aromatase inhibitor. 2. Anemia. Previous iron studies, SPEP, UPEP unremarkable. Hemoglobin is currently stable. We will continue to monitor. 3. Thrombocytosis. Platelet count today is 386,000. Patient is currently on Hydrea 500 mg once daily 6 days per week. We'll continue to monitor.  Patient will return for further evaluation in 3 months. Patient expressed understanding and was in agreement with this plan. She also understands that She can call clinic at any time with any questions, concerns, or complaints.   Dr. Oliva Bustard was available for consultation and review of plan of care for this patient.    Evlyn Kanner, NP   04/02/2015 10:55 AM

## 2015-05-02 ENCOUNTER — Emergency Department: Payer: Medicare Other

## 2015-05-02 ENCOUNTER — Ambulatory Visit (INDEPENDENT_AMBULATORY_CARE_PROVIDER_SITE_OTHER)
Admission: EM | Admit: 2015-05-02 | Discharge: 2015-05-02 | Disposition: A | Discharge status: Other Acute Inpatient Hospital | Payer: Medicare Other | Source: Home / Self Care | Attending: Family Medicine | Admitting: Family Medicine

## 2015-05-02 ENCOUNTER — Other Ambulatory Visit: Payer: Self-pay

## 2015-05-02 ENCOUNTER — Encounter: Payer: Self-pay | Admitting: Intensive Care

## 2015-05-02 ENCOUNTER — Observation Stay
Admission: EM | Admit: 2015-05-02 | Discharge: 2015-05-03 | Disposition: A | Discharge status: Home / Self Care | Payer: Medicare Other | Attending: Internal Medicine | Admitting: Internal Medicine

## 2015-05-02 DIAGNOSIS — R0602 Shortness of breath: Secondary | ICD-10-CM | POA: Insufficient documentation

## 2015-05-02 DIAGNOSIS — N189 Chronic kidney disease, unspecified: Secondary | ICD-10-CM | POA: Insufficient documentation

## 2015-05-02 DIAGNOSIS — J069 Acute upper respiratory infection, unspecified: Secondary | ICD-10-CM

## 2015-05-02 DIAGNOSIS — J449 Chronic obstructive pulmonary disease, unspecified: Secondary | ICD-10-CM | POA: Insufficient documentation

## 2015-05-02 DIAGNOSIS — I481 Persistent atrial fibrillation: Secondary | ICD-10-CM | POA: Diagnosis not present

## 2015-05-02 DIAGNOSIS — I129 Hypertensive chronic kidney disease with stage 1 through stage 4 chronic kidney disease, or unspecified chronic kidney disease: Secondary | ICD-10-CM | POA: Diagnosis not present

## 2015-05-02 DIAGNOSIS — E785 Hyperlipidemia, unspecified: Secondary | ICD-10-CM | POA: Diagnosis not present

## 2015-05-02 DIAGNOSIS — F419 Anxiety disorder, unspecified: Secondary | ICD-10-CM | POA: Insufficient documentation

## 2015-05-02 DIAGNOSIS — R05 Cough: Secondary | ICD-10-CM | POA: Insufficient documentation

## 2015-05-02 DIAGNOSIS — Z952 Presence of prosthetic heart valve: Secondary | ICD-10-CM | POA: Diagnosis not present

## 2015-05-02 DIAGNOSIS — I480 Paroxysmal atrial fibrillation: Secondary | ICD-10-CM | POA: Diagnosis present

## 2015-05-02 DIAGNOSIS — Z87891 Personal history of nicotine dependence: Secondary | ICD-10-CM | POA: Diagnosis not present

## 2015-05-02 DIAGNOSIS — I4892 Unspecified atrial flutter: Secondary | ICD-10-CM

## 2015-05-02 DIAGNOSIS — Z7952 Long term (current) use of systemic steroids: Secondary | ICD-10-CM | POA: Diagnosis not present

## 2015-05-02 DIAGNOSIS — E871 Hypo-osmolality and hyponatremia: Secondary | ICD-10-CM | POA: Insufficient documentation

## 2015-05-02 DIAGNOSIS — Z7901 Long term (current) use of anticoagulants: Secondary | ICD-10-CM | POA: Insufficient documentation

## 2015-05-02 DIAGNOSIS — Z7982 Long term (current) use of aspirin: Secondary | ICD-10-CM | POA: Insufficient documentation

## 2015-05-02 DIAGNOSIS — Z79899 Other long term (current) drug therapy: Secondary | ICD-10-CM | POA: Insufficient documentation

## 2015-05-02 DIAGNOSIS — E878 Other disorders of electrolyte and fluid balance, not elsewhere classified: Secondary | ICD-10-CM | POA: Insufficient documentation

## 2015-05-02 DIAGNOSIS — Z853 Personal history of malignant neoplasm of breast: Secondary | ICD-10-CM | POA: Insufficient documentation

## 2015-05-02 DIAGNOSIS — I4891 Unspecified atrial fibrillation: Secondary | ICD-10-CM | POA: Diagnosis present

## 2015-05-02 LAB — CBC
HEMATOCRIT: 33 % — AB (ref 35.0–47.0)
HEMOGLOBIN: 10.5 g/dL — AB (ref 12.0–16.0)
MCH: 26.2 pg (ref 26.0–34.0)
MCHC: 31.9 g/dL — AB (ref 32.0–36.0)
MCV: 82.4 fL (ref 80.0–100.0)
Platelets: 409 10*3/uL (ref 150–440)
RBC: 4.01 MIL/uL (ref 3.80–5.20)
RDW: 18.4 % — ABNORMAL HIGH (ref 11.5–14.5)
WBC: 11.1 10*3/uL — ABNORMAL HIGH (ref 3.6–11.0)

## 2015-05-02 LAB — TROPONIN I
Troponin I: <0.03 ng/mL (ref <0.031)
Troponin I: <0.03 ng/mL (ref <0.031)
Troponin I: <0.03 ng/mL (ref <0.031)
Troponin I: <0.03 ng/mL (ref <0.031)

## 2015-05-02 LAB — COMPREHENSIVE METABOLIC PANEL
ALBUMIN: 3.8 g/dL (ref 3.5–5.0)
ALT: 42 U/L (ref 14–54)
ANION GAP: 13 (ref 5–15)
AST: 53 U/L — ABNORMAL HIGH (ref 15–41)
Alkaline Phosphatase: 77 U/L (ref 38–126)
BUN: 22 mg/dL — ABNORMAL HIGH (ref 6–20)
CO2: 24 mmol/L (ref 22–32)
Calcium: 9.1 mg/dL (ref 8.9–10.3)
Chloride: 89 mmol/L — ABNORMAL LOW (ref 101–111)
Creatinine, Ser: 1.45 mg/dL — ABNORMAL HIGH (ref 0.44–1.00)
GFR calc Af Amer: 42 mL/min — ABNORMAL LOW (ref >60)
GFR calc non Af Amer: 36 mL/min — ABNORMAL LOW (ref >60)
Glucose, Bld: 116 mg/dL — ABNORMAL HIGH (ref 65–99)
POTASSIUM: 4.7 mmol/L (ref 3.5–5.1)
SODIUM: 126 mmol/L — AB (ref 135–145)
TOTAL PROTEIN: 7.4 g/dL (ref 6.5–8.1)
Total Bilirubin: 0.4 mg/dL (ref 0.3–1.2)

## 2015-05-02 LAB — BRAIN NATRIURETIC PEPTIDE: B Natriuretic Peptide: 419 pg/mL — ABNORMAL HIGH (ref 0.0–100.0)

## 2015-05-02 LAB — PROTIME-INR
INR: 2.89
PROTHROMBIN TIME: 30.3 s — AB (ref 11.4–15.0)

## 2015-05-02 LAB — TSH: TSH: 4.62 u[IU]/mL — AB (ref 0.350–4.500)

## 2015-05-02 LAB — MRSA PCR SCREENING: MRSA by PCR: NEGATIVE

## 2015-05-02 MED ORDER — SODIUM CHLORIDE 0.9 % IV SOLN
Freq: Once | INTRAVENOUS | Status: AC
  Administered 2015-05-02: 12:00:00 via INTRAVENOUS

## 2015-05-02 MED ORDER — SODIUM CHLORIDE 0.9 % IJ SOLN: 3.0000 mL | INTRAMUSCULAR | Status: DC | PRN

## 2015-05-02 MED ORDER — AMIODARONE HCL 200 MG PO TABS
200.0000 mg | ORAL_TABLET | Freq: Every day | ORAL | Status: DC
  Administered 2015-05-02 – 2015-05-03 (×2): 200 mg via ORAL
  Filled 2015-05-02 (×2): qty 1

## 2015-05-02 MED ORDER — MOMETASONE FURO-FORMOTEROL FUM 100-5 MCG/ACT IN AERO
2.0000 | INHALATION_SPRAY | Freq: Two times a day (BID) | RESPIRATORY_TRACT | Status: DC
  Administered 2015-05-02: 2 via RESPIRATORY_TRACT
  Filled 2015-05-02: qty 8.8

## 2015-05-02 MED ORDER — ANASTROZOLE 1 MG PO TABS
1.0000 mg | ORAL_TABLET | Freq: Every day | ORAL | Status: DC
  Administered 2015-05-03: 1 mg via ORAL
  Filled 2015-05-02: qty 1

## 2015-05-02 MED ORDER — WARFARIN SODIUM 4 MG PO TABS
4.0000 mg | ORAL_TABLET | Freq: Every day | ORAL | Status: DC
  Administered 2015-05-02 – 2015-05-03 (×2): 4 mg via ORAL
  Filled 2015-05-02 (×2): qty 1

## 2015-05-02 MED ORDER — EZETIMIBE 10 MG PO TABS
10.0000 mg | ORAL_TABLET | Freq: Every day | ORAL | Status: DC
  Administered 2015-05-03: 10 mg via ORAL
  Filled 2015-05-02 (×2): qty 1

## 2015-05-02 MED ORDER — DILTIAZEM HCL 25 MG/5ML IV SOLN
5.0000 mg | Freq: Once | INTRAVENOUS | Status: AC
  Administered 2015-05-02: 5 mg via INTRAVENOUS
  Filled 2015-05-02: qty 5

## 2015-05-02 MED ORDER — ADULT MULTIVITAMIN W/MINERALS CH
1.0000 | ORAL_TABLET | Freq: Every day | ORAL | Status: DC
  Administered 2015-05-03: 1 via ORAL
  Filled 2015-05-02: qty 1

## 2015-05-02 MED ORDER — METOPROLOL SUCCINATE ER 50 MG PO TB24
50.0000 mg | ORAL_TABLET | Freq: Two times a day (BID) | ORAL | Status: DC
  Administered 2015-05-02 – 2015-05-03 (×2): 50 mg via ORAL
  Filled 2015-05-02 (×2): qty 1

## 2015-05-02 MED ORDER — ALPRAZOLAM 0.5 MG PO TABS: 0.5000 mg | ORAL_TABLET | Freq: Two times a day (BID) | ORAL | Status: DC | PRN

## 2015-05-02 MED ORDER — CYANOCOBALAMIN 1000 MCG/ML IJ SOLN: 1000.0000 ug | INTRAMUSCULAR | Status: DC

## 2015-05-02 MED ORDER — ASPIRIN EC 81 MG PO TBEC
81.0000 mg | DELAYED_RELEASE_TABLET | Freq: Every day | ORAL | Status: DC
  Administered 2015-05-03: 81 mg via ORAL
  Filled 2015-05-02: qty 1

## 2015-05-02 MED ORDER — SODIUM CHLORIDE 0.9 % IV SOLN
INTRAVENOUS | Status: DC
  Administered 2015-05-02: 125 mL/h via INTRAVENOUS
  Administered 2015-05-02: 19:00:00 via INTRAVENOUS

## 2015-05-02 MED ORDER — SODIUM CHLORIDE 0.9 % IV SOLN
250.0000 mL | INTRAVENOUS | Status: DC
  Administered 2015-05-03: 08:00:00 via INTRAVENOUS

## 2015-05-02 MED ORDER — SODIUM CHLORIDE 0.9 % IJ SOLN
3.0000 mL | Freq: Two times a day (BID) | INTRAMUSCULAR | Status: DC
  Administered 2015-05-02: 3 mL via INTRAVENOUS

## 2015-05-02 MED ORDER — DILTIAZEM HCL 25 MG/5ML IV SOLN
10.0000 mg | Freq: Once | INTRAVENOUS | Status: AC
  Administered 2015-05-02: 10 mg via INTRAVENOUS
  Filled 2015-05-02: qty 5

## 2015-05-02 MED ORDER — GUAIFENESIN 100 MG/5ML PO SOLN
10.0000 mL | Freq: Four times a day (QID) | ORAL | Status: DC | PRN
  Administered 2015-05-03: 200 mg via ORAL
  Filled 2015-05-02: qty 10

## 2015-05-02 MED ORDER — IPRATROPIUM-ALBUTEROL 0.5-2.5 (3) MG/3ML IN SOLN: 3.0000 mL | RESPIRATORY_TRACT | Status: DC | PRN

## 2015-05-02 MED ORDER — HYDROXYUREA 500 MG PO CAPS
500.0000 mg | ORAL_CAPSULE | ORAL | Status: DC
  Administered 2015-05-03: 500 mg via ORAL
  Filled 2015-05-02: qty 1

## 2015-05-02 MED ORDER — WARFARIN - PHYSICIAN DOSING INPATIENT
Freq: Every day | Status: DC
  Administered 2015-05-02: 19:00:00

## 2015-05-02 MED ORDER — GUAIFENESIN 100 MG/5ML PO SOLN
5.0000 mL | ORAL | Status: DC | PRN
Start: 2015-05-02 — End: 2015-05-02

## 2015-05-02 MED ORDER — URSODIOL 300 MG PO CAPS
300.0000 mg | ORAL_CAPSULE | Freq: Four times a day (QID) | ORAL | Status: DC
  Administered 2015-05-02 – 2015-05-03 (×4): 300 mg via ORAL
  Filled 2015-05-02 (×10): qty 1

## 2015-05-02 NOTE — ED Provider Notes (Signed)
[facility] Emergency Department Provider Note     Time seen: ----------------------------------------- 10:20 AM on 05/02/2015 -----------------------------------------    I have reviewed the triage vital signs and the nursing notes.   HISTORY  Chief Complaint Atrial Fibrillation    HPI Adriana Martin is a 69 y.o. female who presents ER being brought by EMS for A. fib. Patient states she's has cough for the last week and went to [facility] urgent care. The clinic called EMS to bring the patient to the ER for A. fib. Patient does have a intermittent history of atrial fibrillation. She's had ablation July 2016 and mitral valve replacement on September 2015. Currently takes Coumadin. Denies fevers chills or chest pain. Has intermittent shortness of breath Past Medical History  Diagnosis Date  . Hepatitis   . Atrial fibrillation   . Breast cancer   . Hyperlipemia   . Hypertension     Patient Active Problem List   Diagnosis Date Noted  . Breast cancer 04/02/2015  . Thrombocytosis 02/01/2015  . Anemia 02/01/2015  . Atrial flutter 01/23/2015  . Atrial fibrillation 01/23/2015    Past Surgical History  Procedure Laterality Date  . Abdominal hysterectomy      Partial  . Eye surgery    . Cardiac surgery    . Mvp    . Mastectomy    . Breast surgery    . Ablation      Allergies Atorvastatin; Calcium carbonate; Fluoxetine; and Hydrochlorothiazide  Social History Social History  Substance Use Topics  . Smoking status: Former Smoker -- 0.25 packs/day    Types: Cigarettes    Quit date: 10/07/1998  . Smokeless tobacco: Not on file  . Alcohol Use: 2.4 oz/week    4 Cans of beer per week     Comment: socially    Review of Systems Constitutional: Negative for fever. Eyes: Negative for visual changes. ENT: Negative for sore throat. Cardiovascular: Negative for chest pain. Positive for palpitations Respiratory: Positive shortness breath and  cough Gastrointestinal: Negative for abdominal pain, vomiting and diarrhea. Genitourinary: Negative for dysuria. Musculoskeletal: Negative for back pain. Skin: Negative for rash. Neurological: Negative for headaches, focal weakness or numbness.  10-point ROS otherwise negative.  ____________________________________________   PHYSICAL EXAM:  VITAL SIGNS: ED Triage Vitals  Enc Vitals Group     BP --      Pulse --      Resp --      Temp --      Temp src --      SpO2 --      Weight --      Height --      Head Cir --      Peak Flow --      Pain Score --      Pain Loc --      Pain Edu? --      Excl. in [place]? --     Constitutional: Alert and oriented. Well appearing and in no distress. Eyes: Conjunctivae are normal. PERRL. Normal extraocular movements. ENT   Head: Normocephalic and atraumatic.   Nose: No congestion/rhinnorhea.   Mouth/Throat: Mucous membranes are moist.   Neck: No stridor. Cardiovascular: Irregularly irregular rhythm. Normal and symmetric distal pulses are present in all extremities. No murmurs, rubs, or gallops. Respiratory: Normal respiratory effort without tachypnea nor retractions. Breath sounds are clear and equal bilaterally. No wheezes/rales/rhonchi. Gastrointestinal: Soft and nontender. No distention. No abdominal bruits.  Musculoskeletal: Nontender with normal range of motion  in all extremities. No joint effusions.  No lower extremity tenderness nor edema. Neurologic:  Normal speech and language. No gross focal neurologic deficits are appreciated. Speech is normal. No gait instability. Skin:  Skin is warm, dry and intact. No rash noted. Psychiatric: Mood and affect are normal. Speech and behavior are normal. Patient exhibits appropriate insight and judgment. ____________________________________________  EKG: Interpreted by me. Atrial flutter with variable block, rate is 115 bpm, nonspecific ST and T-wave changes. Normal axis, no evidence  of acute infarction  ____________________________________________  ED COURSE:  Pertinent labs & imaging results that were available during my care of the patient were reviewed by me and considered in my medical decision making (see chart for details). Patient will receive IV Cardizem to slow her rate down, we'll check cardiac labs and Coumadin level. ____________________________________________    LABS (pertinent positives/negatives)  Labs Reviewed  CBC - Abnormal; Notable for the following:    WBC 11.1 (*)    Hemoglobin 10.5 (*)    HCT 33.0 (*)    MCHC 31.9 (*)    RDW 18.4 (*)    All other components within normal limits  PROTIME-INR - Abnormal; Notable for the following:    Prothrombin Time 30.3 (*)    All other components within normal limits  COMPREHENSIVE METABOLIC PANEL - Abnormal; Notable for the following:    Sodium 126 (*)    Chloride 89 (*)    Glucose, Bld 116 (*)    BUN 22 (*)    Creatinine, Ser 1.45 (*)    AST 53 (*)    GFR calc non Af Amer 36 (*)    GFR calc Af Amer 42 (*)    All other components within normal limits  BRAIN NATRIURETIC PEPTIDE - Abnormal; Notable for the following:    B Natriuretic Peptide 419.0 (*)    All other components within normal limits  TROPONIN I    RADIOLOGY Images were viewed by me  Chest x-ray Is unremarkable ____________________________________________  FINAL ASSESSMENT AND PLAN  Atrial fibrillation, hyponatremia  Plan: Patient with labs and imaging as dictated above. Patient was soft blood pressure after IV Cardizem. Currently be maintaining with amiodarone. Hyponatremia, will give saline at a slow rate. She would benefit from observation and cardiology consult. Earleen Newport, MD   Earleen Newport, MD 05/02/15 [phone]

## 2015-05-02 NOTE — H&P (Signed)
[facility] [place] NAME: Adriana Martin    MR#:  [id number]  DATE OF BIRTH:  06-23-1946  DATE OF ADMISSION:  05/02/2015  PRIMARY CARE PHYSICIAN: [name], MD   REQUESTING/REFERRING PHYSICIAN: Esperanza Heir  CHIEF COMPLAINT:   Chief Complaint  Patient presents with  . Atrial Fibrillation    HISTORY OF PRESENT ILLNESS: Adriana Martin  is a 69 y.o. female with a known history of hepatitis, atrial fibrillation status post ablation done a month ago taking Coumadin daily, breast cancer status post surgery, hyperlipidemia, hypertension- said that she felt her pulse last night and she felt it was irregular. She denies any complain of shortness of breath chest pain but feeling slight palpitations and no dizziness. And because of having atrial fibrillation repeatedly and in the past she knew by feeling her pulse that her heart is again out of control and beating faster so she came to emergency room today morning. She had the some cold and cough for last few days, which she thinks brought out her atrial fibrillation episode. Should she was given injection Cardizem in emergency room which helped to slow down her heart rate a little bit but then again during my examination the heart rate is running between 110-120 and her blood pressure is running in 78I SYSTOLIC.  PAST MEDICAL HISTORY:   Past Medical History  Diagnosis Date  . Hepatitis   . Atrial fibrillation   . Breast cancer   . Hyperlipemia   . Hypertension     PAST SURGICAL HISTORY:  Past Surgical History  Procedure Laterality Date  . Abdominal hysterectomy      Partial  . Eye surgery    . Cardiac surgery    . Mvp    . Mastectomy    . Breast surgery    . Ablation      SOCIAL HISTORY:  Social History  Substance Use Topics  . Smoking status: Former Smoker -- 0.25 packs/day    Types: Cigarettes    Quit date: 10/07/1998  . Smokeless tobacco: Not on file  . Alcohol Use:  2.4 oz/week    4 Cans of beer per week     Comment: socially    FAMILY HISTORY:  Family History  Problem Relation Age of Onset  . Cancer Mother   . Cancer Father   . Cancer Brother   . Cancer Daughter     DRUG ALLERGIES:  Allergies  Allergen Reactions  . Atorvastatin     Other reaction(s): Unknown  . Calcium Carbonate     Other reaction(s): Unknown  . Fluoxetine     Other reaction(s): Unknown  . Hydrochlorothiazide     Other reaction(s): Unknown    REVIEW OF SYSTEMS:   CONSTITUTIONAL: No fever, fatigue or weakness.  EYES: No blurred or double vision.  EARS, NOSE, AND THROAT: No tinnitus or ear pain.  RESPIRATORY: No cough, shortness of breath, wheezing or hemoptysis.  CARDIOVASCULAR: No chest pain, orthopnea, edema. Have ome palpitation. GASTROINTESTINAL: No nausea, vomiting, diarrhea or abdominal pain.  GENITOURINARY: No dysuria, hematuria.  ENDOCRINE: No polyuria, nocturia,  HEMATOLOGY: No anemia, easy bruising or bleeding SKIN: No rash or lesion. MUSCULOSKELETAL: No joint pain or arthritis.   NEUROLOGIC: No tingling, numbness, weakness.  PSYCHIATRY: No anxiety or depression.   MEDICATIONS AT HOME:  Prior to Admission medications   Medication Sig Start Date End Date Taking? Authorizing Provider  ADVAIR DISKUS 100-50 MCG/DOSE AEPB Inhale 1 puff into the  lungs 2 (two) times daily.  10/29/14  Yes Historical Provider, MD  ALPRAZolam Duanne Moron) 0.5 MG tablet Take 0.5 mg by mouth 2 (two) times daily as needed for anxiety.   Yes Historical Provider, MD  anastrozole (ARIMIDEX) 1 MG tablet Take 1 mg by mouth daily.   Yes Historical Provider, MD  aspirin 81 MG tablet Take 81 mg by mouth daily.   Yes Historical Provider, MD  cyanocobalamin (,VITAMIN B-12,) 1000 MCG/ML injection Inject 1,000 mcg into the muscle every 30 (thirty) days.  11/27/14 11/22/15 Yes Historical Provider, MD  furosemide (LASIX) 20 MG tablet Take 20 mg by mouth daily.  06/11/14 06/11/15 Yes Historical Provider, MD   hydroxyurea (HYDREA) 500 MG capsule Take 500 mg by mouth See admin instructions. Take 6 days a week (everyday BUT Sunday)   Yes Historical Provider, MD  metoprolol succinate (TOPROL-XL) 50 MG 24 hr tablet Take 50 mg by mouth 2 (two) times daily. Take with or immediately following a meal.   Yes Historical Provider, MD  Multiple Vitamins-Minerals (CENTRUM SILVER ULTRA WOMENS) TABS Take 1 tablet by mouth daily.   Yes Historical Provider, MD  potassium chloride SA (K-DUR,KLOR-CON) 20 MEQ tablet Take 20 mEq by mouth daily.    Yes Historical Provider, MD  predniSONE (DELTASONE) 5 MG tablet Take 5 mg by mouth daily with breakfast.   Yes Historical Provider, MD  PROAIR HFA 108 (90 BASE) MCG/ACT inhaler 2 puffs every 4 (four) hours as needed for wheezing or shortness of breath.  10/29/14  Yes Historical Provider, MD  Probiotic Product ([place]) CAPS Take 1 capsule by mouth daily.   Yes Historical Provider, MD  ursodiol (ACTIGALL) 300 MG capsule Take 300 mg by mouth 4 (four) times daily.    Yes Historical Provider, MD  warfarin (COUMADIN) 2 MG tablet Take 4 mg by mouth daily.  04/01/15  Yes Historical Provider, MD  ZETIA 10 MG tablet Take 10 mg by mouth daily.  01/31/15  Yes Historical Provider, MD      PHYSICAL EXAMINATION:   VITAL SIGNS: Blood pressure 83/57, pulse 117, temperature 98.2 F (36.8 C), temperature source Oral, resp. rate 22, SpO2 94 %.  GENERAL:  69 y.o.-year-old patient lying in the bed with no acute distress.  EYES: Pupils equal, round, reactive to light and accommodation. No scleral icterus. Extraocular muscles intact.  HEENT: Head atraumatic, normocephalic. Oropharynx and nasopharynx clear.  NECK:  Supple, no jugular venous distention. No thyroid enlargement, no tenderness.  LUNGS: Normal breath sounds bilaterally, no wheezing, rales,rhonchi or crepitation. No use of accessory muscles of respiration.  CARDIOVASCULAR: S1, S2 irregular and fast. No murmurs, rubs, or  gallops.  ABDOMEN: Soft, nontender, nondistended. Bowel sounds present. No organomegaly or mass.  EXTREMITIES: No pedal edema, cyanosis, or clubbing.  NEUROLOGIC: Cranial nerves II through XII are intact. Muscle strength 5/5 in all extremities. Sensation intact. Gait not checked.  PSYCHIATRIC: The patient is alert and oriented x 3.  SKIN: No obvious rash, lesion, or ulcer.   LABORATORY PANEL:   CBC  Recent Labs Lab 05/02/15 1016  WBC 11.1*  HGB 10.5*  HCT 33.0*  PLT 409  MCV 82.4  MCH 26.2  MCHC 31.9*  RDW 18.4*   ------------------------------------------------------------------------------------------------------------------  Chemistries   Recent Labs Lab 05/02/15 1016  NA 126*  K 4.7  CL 89*  CO2 24  GLUCOSE 116*  BUN 22*  CREATININE 1.45*  CALCIUM 9.1  AST 53*  ALT 42  ALKPHOS 77  BILITOT 0.4   ------------------------------------------------------------------------------------------------------------------  estimated creatinine clearance is 37.3 mL/min (by C-G formula based on Cr of 1.45). ------------------------------------------------------------------------------------------------------------------ No results for input(s): TSH, T4TOTAL, T3FREE, THYROIDAB in the last 72 hours.  Invalid input(s): FREET3   Coagulation profile  Recent Labs Lab 05/02/15 1016  INR 2.89   ------------------------------------------------------------------------------------------------------------------- No results for input(s): DDIMER in the last 72 hours. -------------------------------------------------------------------------------------------------------------------  Cardiac Enzymes  Recent Labs Lab 05/02/15 1016  TROPONINI <0.03   ------------------------------------------------------------------------------------------------------------------ Invalid input(s):  POCBNP  ---------------------------------------------------------------------------------------------------------------  Urinalysis    Component Value Date/Time   COLORURINE Straw 06/09/2013 2122   APPEARANCEUR Clear 06/09/2013 2122   LABSPEC 1.004 06/09/2013 2122   PHURINE 6.0 06/09/2013 2122   GLUCOSEU Negative 06/09/2013 2122   HGBUR 1+ 06/09/2013 2122   BILIRUBINUR Negative 06/09/2013 2122   KETONESUR Negative 06/09/2013 2122   PROTEINUR Negative 06/09/2013 2122   NITRITE Negative 06/09/2013 2122   LEUKOCYTESUR Negative 06/09/2013 2122     RADIOLOGY: Dg Chest 2 View  05/02/2015   CLINICAL DATA:  Cough, shortness of breath.  EXAM: CHEST  2 VIEW  COMPARISON:  November 02, 2013.  FINDINGS: No pneumothorax or pleural effusion is noted. Stable mild cardiomegaly is noted. Status post cardiac valve replacement. Both lungs are clear. The visualized skeletal structures are unremarkable.  IMPRESSION: No active cardiopulmonary disease.   Electronically Signed   By: Marijo Conception, M.D.   On: 05/02/2015 10:49    EKG: A fib with rate 110.  IMPRESSION AND PLAN:  * Atrial fibrillation with rapid ventricular response  She had the ablation done in the past, and she is taking metoprolol for rate control.  Currently heart rate is running faster, I will give him more injection of Cardizem to help slow it down.  Cardiology consult for further management with Dr. Nehemiah Massed.  Continue Coumadin INR is therapeutic.  Monitor on telemetry, follow serial troponin.  * Hypertension  Continue metoprolol, hold Lasix due to hyponatremia and hypochloremia.  * COPD  No signs of exacerbation, continue home inhalers.  * Chronic renal failure with hyponatremia and hypochloremia  She was taking Lasix currently I will hold it, started on gentle IV hydration by ER.  All the records are reviewed and case discussed with ED provider. Management plans discussed with the patient, family and they are in  agreement.  CODE STATUS: full   TOTAL TIME TAKING CARE OF THIS PATIENT: 50 minutes.    Vaughan Basta M.D on 05/02/2015   Between 7am to 6pm - Pager - [phone]  After 6pm go to [email or web address] - password [email or web address] [facility]  Office  [phone]  CC: Primary care physician; [name], MD

## 2015-05-02 NOTE — ED Provider Notes (Signed)
CSN: [id number]     Arrival date & time 05/02/15  5035 History   First MD Initiated Contact with Patient 05/02/15 [phone]     Chief Complaint  Patient presents with  . Shortness of Breath   patient reports having history of A. fib. She notes was electro converted in June or July this year by Dr. Nehemiah Massed at [facility]. She reports that today she feels excellent back into her A. fib. She is having shortness of breath and has a history of a URI.  She denies chest pain and states she takes a baby aspirin a day. Her low she's had A. fib for about 4 years off and on which  was diagnosed when she had a mastectomy. She also reports being admitted to [facility] for congestive heart failure. (Consider location/radiation/quality/duration/timing/severity/associated sxs/prior Treatment) Patient is a 69 y.o. female presenting with shortness of breath. The history is provided by the patient. A language interpreter was used.  Shortness of Breath Severity:  Severe Onset quality:  Sudden Progression:  Waxing and waning Context: URI   Context: not activity and not known allergens   Relieved by:  Nothing Worsened by:  Nothing tried Ineffective treatments:  None tried Associated symptoms: cough, diaphoresis and wheezing   Associated symptoms: no rash   Cough:    Cough characteristics:  Non-productive   Duration:  1 week   Timing:  Unable to specify  Family medical history was reviewed she is a former smoker. She's had a meniscectomy for breast cancer and hysterectomy in the past  Past Medical History  Diagnosis Date  . Hepatitis   . Atrial fibrillation   . Breast cancer   . Hyperlipemia   . Hypertension    Past Surgical History  Procedure Laterality Date  . Abdominal hysterectomy      Partial  . Eye surgery    . Cardiac surgery    . Mvp    . Mastectomy    . Breast surgery    . Ablation     Family History  Problem Relation Age of Onset  . Cancer Mother   . Cancer Father   . Cancer Brother     Social History  Substance Use Topics  . Smoking status: Former Smoker -- 0.25 packs/day    Types: Cigarettes    Quit date: 10/07/1998  . Smokeless tobacco: None  . Alcohol Use: 2.4 oz/week    4 Cans of beer per week     Comment: socially   OB History    No data available     Review of Systems  Constitutional: Positive for diaphoresis.  Respiratory: Positive for cough, shortness of breath and wheezing.   Skin: Negative for rash.   Allergies  Atorvastatin; Calcium carbonate; Fluoxetine; and Hydrochlorothiazide  Home Medications   Prior to Admission medications   Medication Sig Start Date End Date Taking? Authorizing Provider  ADVAIR DISKUS 100-50 MCG/DOSE AEPB  10/29/14  Yes Historical Provider, MD  ALPRAZolam Duanne Moron) 0.5 MG tablet Take 0.5 mg by mouth 2 (two) times daily as needed for anxiety.   Yes Historical Provider, MD  amiodarone (PACERONE) 200 MG tablet Take 200 mg by mouth daily.   Yes Historical Provider, MD  anastrozole (ARIMIDEX) 1 MG tablet Take 1 mg by mouth daily.   Yes Historical Provider, MD  aspirin 81 MG tablet Take 81 mg by mouth daily.   Yes Historical Provider, MD  cyanocobalamin (,VITAMIN B-12,) 1000 MCG/ML injection Inject into the muscle. 11/27/14 11/22/15 Yes Historical Provider,  MD  furosemide (LASIX) 20 MG tablet Take by mouth daily as needed.  06/11/14 06/11/15 Yes Historical Provider, MD  hydroxyurea (HYDREA) 500 MG capsule TK ONE C PO  PO 6 TIMES A WEEK 01/15/15  Yes Historical Provider, MD  metoprolol succinate (TOPROL-XL) 50 MG 24 hr tablet Take 50 mg by mouth daily. Take with or immediately following a meal.   Yes Historical Provider, MD  Multiple Vitamins-Minerals (CENTRUM SILVER ULTRA WOMENS) TABS Take 1 tablet by mouth daily.   Yes Historical Provider, MD  potassium chloride SA (K-DUR,KLOR-CON) 20 MEQ tablet Take 20 mEq by mouth daily as needed (When taking furosemide.).    Yes Historical Provider, MD  predniSONE (DELTASONE) 5 MG tablet Take 5 mg by  mouth daily with breakfast.   Yes Historical Provider, MD  PROAIR HFA 108 (90 BASE) MCG/ACT inhaler  10/29/14  Yes Historical Provider, MD  Probiotic Product ([place]) CAPS Take 1 capsule by mouth daily.   Yes Historical Provider, MD  ursodiol (ACTIGALL) 300 MG capsule Take 300 mg by mouth 2 (two) times daily.   Yes Historical Provider, MD  warfarin (COUMADIN) 2 MG tablet Take 2 mg by mouth daily. 04/01/15  Yes Historical Provider, MD  ZETIA 10 MG tablet  01/31/15  Yes Historical Provider, MD  aspirin EC 81 MG tablet Take by mouth. 05/07/14   Historical Provider, MD  ergocalciferol (VITAMIN D2) 50000 UNITS capsule Take 50,000 Units by mouth every 30 (thirty) days.    Historical Provider, MD  ketorolac (ACULAR) 0.4 % SOLN  12/12/14   Historical Provider, MD  lisinopril (PRINIVIL,ZESTRIL) 5 MG tablet Take 5 mg by mouth daily.    Historical Provider, MD  mirtazapine (REMERON) 15 MG tablet Take 15 mg by mouth at bedtime.    Historical Provider, MD  ofloxacin (OCUFLOX) 0.3 % ophthalmic solution  12/12/14   Historical Provider, MD  prednisoLONE acetate (PRED FORTE) 1 % ophthalmic suspension  12/12/14   Historical Provider, MD   BP 133/91 mmHg  Pulse 122  Temp(Src) 97.3 F (36.3 C) (Tympanic)  Resp 18  Ht 5\' 2"  (1.575 m)  Wt 190 lb (86.183 kg)  BMI 34.74 kg/m2  SpO2 97% Physical Exam  Constitutional: She appears well-nourished.  HENT:  Head: Normocephalic and atraumatic.  Eyes: Pupils are equal, round, and reactive to light.  Neck: Normal range of motion. Neck supple. No tracheal deviation present. No thyromegaly present.  Cardiovascular: An irregular rhythm present. Tachycardia present.  Exam reveals no gallop.   No murmur heard.  No systolic murmur is present   No diastolic murmur is present  Atrial fibrillation is present with  Pulmonary/Chest: Breath sounds normal. No respiratory distress. She has no wheezes.  Musculoskeletal: Normal range of motion.  Neurological: She is  alert.  Skin: Skin is warm. No rash noted. She is diaphoretic. No erythema.  Psychiatric: She has a normal mood and affect.  Vitals reviewed.   ED Course  Procedures (including critical care time) Labs Review Labs Reviewed - No data to display  Imaging Review No results found. EKG showed atrial flutter present with some nonspecific ST depression as well which was concerning.  MDM  No diagnosis found. Carotid massage was performed without any reduction in heart rate. Because of lack of reduction in heart rate Shortness of breath and abnormal EKG EMS was called and they're transporting her to [facility] for further evaluation and treatment.    Frederich Cha, MD 05/02/15 1140

## 2015-05-02 NOTE — ED Notes (Signed)
Came to  [place] for symptoms of cough x 1 week. Had cardiac ablation July 2016. "I think I'm in atrial fib". To room 5 with exertional SOB. Connected to cardiac monitor which displays atrial fib/flutter and rate 120-130's. Color pink. Skin cool and is diaphoretic. Also has hx of CHF

## 2015-05-02 NOTE — Consult Note (Signed)
[place]  CARDIOLOGY CONSULT NOTE  Patient ID: Adriana Martin MRN: [id number] DOB/AGE: July 29, 1946 69 y.o.  Admit date: 05/02/2015 Referring Physician Dr. Anselm Jungling Primary Physician   Primary Cardiologist Nehemiah Massed Reason for Consultation afib  HPI: Pt is a 69 yo female with history of recurrent afib which has been difficult to control. Se was treated with a MAZE procedure during mitral valve surgery. She had recurrent afib and underwent ablation of her afib several weeks.ago. She was continued with amiodarone and anticoagulation with warfarin. She is now admitted with recurrent afib with variable ventricular response . Her inr is therapuetic and she has been compliant with amidoarone per her report. She is being treated with amiodarone po at 200 daily .   ROS Review of Systems - Negative except palpitatons, sob, anxiety and weakness   Past Medical History  Diagnosis Date  . Hepatitis   . Atrial fibrillation   . Breast cancer   . Hyperlipemia   . Hypertension     Family History  Problem Relation Age of Onset  . Cancer Mother   . Cancer Father   . Cancer Brother   . Cancer Daughter     Social History   Social History  . Marital Status: Divorced    Spouse Name: N/A  . Number of Children: N/A  . Years of Education: N/A   Occupational History  . Not on file.   Social History Main Topics  . Smoking status: Former Smoker -- 0.25 packs/day    Types: Cigarettes    Quit date: 10/07/1998  . Smokeless tobacco: Not on file  . Alcohol Use: 2.4 oz/week    4 Cans of beer per week     Comment: socially  . Drug Use: No  . Sexual Activity: Not on file   Other Topics Concern  . Not on file   Social History Narrative    Past Surgical History  Procedure Laterality Date  . Abdominal hysterectomy      Partial  . Eye surgery    . Cardiac surgery    . Mvp    . Mastectomy    . Breast surgery    . Ablation       Prescriptions prior  to admission  Medication Sig Dispense Refill Last Dose  . ADVAIR DISKUS 100-50 MCG/DOSE AEPB Inhale 1 puff into the lungs 2 (two) times daily.   0 05/02/2015 at Unknown time  . ALPRAZolam (XANAX) 0.5 MG tablet Take 0.5 mg by mouth 2 (two) times daily as needed for anxiety.   prn at prn  . anastrozole (ARIMIDEX) 1 MG tablet Take 1 mg by mouth daily.   05/02/2015 at Unknown time  . aspirin 81 MG tablet Take 81 mg by mouth daily.   05/02/2015 at Unknown time  . cyanocobalamin (,VITAMIN B-12,) 1000 MCG/ML injection Inject 1,000 mcg into the muscle every 30 (thirty) days.    Past Week at Unknown time  . furosemide (LASIX) 20 MG tablet Take 20 mg by mouth daily.    05/02/2015 at Unknown time  . hydroxyurea (HYDREA) 500 MG capsule Take 500 mg by mouth See admin instructions. Take 6 days a week (everyday BUT Sunday)   05/02/2015 at Unknown time  . metoprolol succinate (TOPROL-XL) 50 MG 24 hr tablet Take 50 mg by mouth 2 (two) times daily. Take with or immediately following a meal.   05/02/2015 at Unknown time  . Multiple Vitamins-Minerals (CENTRUM SILVER ULTRA WOMENS) TABS Take 1  tablet by mouth daily.   05/02/2015 at Unknown time  . potassium chloride SA (K-DUR,KLOR-CON) 20 MEQ tablet Take 20 mEq by mouth daily.    05/02/2015 at Unknown time  . predniSONE (DELTASONE) 5 MG tablet Take 5 mg by mouth daily with breakfast.   05/02/2015 at Unknown time  . PROAIR HFA 108 (90 BASE) MCG/ACT inhaler 2 puffs every 4 (four) hours as needed for wheezing or shortness of breath.   0 prn at prn  . Probiotic Product ([place]) CAPS Take 1 capsule by mouth daily.   05/02/2015 at Unknown time  . ursodiol (ACTIGALL) 300 MG capsule Take 300 mg by mouth 4 (four) times daily.    05/02/2015 at Unknown time  . warfarin (COUMADIN) 2 MG tablet Take 4 mg by mouth daily.   5 05/01/2015 at Unknown time  . ZETIA 10 MG tablet Take 10 mg by mouth daily.   6 05/01/2015 at Unknown time    Physical Exam: Blood pressure 88/59, pulse 96,  temperature 97.9 F (36.6 C), temperature source Axillary, resp. rate 20, SpO2 96 %.    General appearance: alert, cooperative and anxious Resp: clear to auscultation bilaterally Cardio: regularly irregular rhythm GI: soft, non-tender; bowel sounds normal; no masses,  no organomegaly Pulses: 2+ and symmetric Neurologic: Grossly normal Labs:   Lab Results  Component Value Date   WBC 11.1* 05/02/2015   HGB 10.5* 05/02/2015   HCT 33.0* 05/02/2015   MCV 82.4 05/02/2015   PLT 409 05/02/2015     Recent Labs Lab 05/02/15 1016  NA 126*  K 4.7  CL 89*  CO2 24  BUN 22*  CREATININE 1.45*  CALCIUM 9.1  PROT 7.4  BILITOT 0.4  ALKPHOS 77  ALT 42  AST 53*  GLUCOSE 116*   Lab Results  Component Value Date   TROPONINI <0.03 05/02/2015      Radiology: no asd EKG: afib with variable ventricular response  ASSESSMENT AND PLAN:  Pt with history of recurrant afib s/p maze procedure and afib ablation. Will continue amiodarone and warfarin. Consider increasing amiodarone to control rate. Will attempt cardioversion in am. Will need referral back to ep for consideration for avnode ablation and pacer back up. This can been done as outpaitnet.  Signed: Teodoro Spray MD, [facility] 05/02/2015, 8:52 PM

## 2015-05-02 NOTE — Discharge Instructions (Signed)
Atrial Flutter Atrial flutter is a heart rhythm that can cause the heart to beat very fast (tachycardia). It originates in the upper chambers of the heart (atria). In atrial flutter, the top chambers of the heart (atria) often beat much faster than the bottom chambers of the heart (ventricles). Atrial flutter has a regular "saw toothed" appearance in an EKG readout. An EKG is a test that records the electrical activity of the heart. Atrial flutter can cause the heart to beat up to 150 beats per minute (BPM). Atrial flutter can either be short lived (paroxysmal) or permanent.  CAUSES  Causes of atrial flutter can be many. Some of these include:  Heart related issues:  Heart attack (myocardial infarction).  Heart failure.  Heart valve problems.  Poorly controlled high blood pressure (hypertension).  After open heart surgery.  Lung related issues:  A blood clot in the lungs (pulmonary embolism).  Chronic obstructive pulmonary disease (COPD). Medications used to treat COPD can attribute to atrial flutter.  Other related causes:  Hyperthyroidism.  Caffeine.  Some decongestant cold medications.  Low electrolyte levels such as potassium or magnesium.  Cocaine. SYMPTOMS  An awareness of your heart beating rapidly (palpitations).  Shortness of breath.  Chest pain.  Low blood pressure (hypotension).  Dizziness or fainting. DIAGNOSIS  Different tests can be performed to diagnose atrial flutter.   An EKG.  Holter monitor. This is a 24-hour recording of your heart rhythm. You will also be given a diary. Write down all symptoms that you have and what you were doing at the time you experienced symptoms.  Cardiac event monitor. This small device can be worn for up to 30 days. When you have heart symptoms, you will push a button on the device. This will then record your heart rhythm.  Echocardiogram. This is an imaging test to look at your heart. Your caregiver will look at your  heart valves and the ventricles.  Stress test. This test can help determine if the atrial flutter is related to exercise or if coronary artery disease is present.  Laboratory studies will look at certain blood levels like:  Complete blood count (CBC).  Potassium.  Magnesium.  Thyroid function. TREATMENT  Treatment of atrial flutter varies. A combination of therapies may be used or sometimes atrial flutter may need only 1 type of treatment.  Lab work: If your blood work, such as your electrolytes (potassium, magnesium) or your thyroid function tests, are abnormal, your caregiver will treat them accordingly.  Medication:  There are several different types of medications that can convert your heart to a normal rhythm and prevent atrial flutter from reoccurring.  Nonsurgical procedures: Nonsurgical techniques may be used to control atrial flutter. Some examples include:  Cardioversion. This technique uses either drugs or an electrical shock to restore a normal heart rhythm:  Cardioversion drugs may be given through an intravenous (IV) line to help "reset" the heart rhythm.  In electrical cardioversion, your caregiver shocks your heart with electrical energy. This helps to reset the heartbeat to a normal rhythm.  Ablation. If atrial flutter is a persistent problem, an ablation may be needed. This procedure is done under mild sedation. High frequency radio-wave energy is used to destroy the area of heart tissue responsible for atrial flutter. SEEK IMMEDIATE MEDICAL CARE IF:  You have:  Dizziness.  Near fainting or fainting.  Shortness of breath.  Chest pain or pressure.  Sudden nausea or vomiting.  Profuse sweating. If you have the above symptoms,  call your local emergency service immediately! Do not drive yourself to the hospital. MAKE SURE YOU:   Understand these instructions.  Will watch your condition.  Will get help right away if you are not doing well or get  worse. Document Released: 01/24/2009 Document Revised: 01/22/2014 Document Reviewed: 01/24/2009 [facility] Patient Information 2015 [place], [place]. This information is not intended to replace advice given to you by your health care provider. Make sure you discuss any questions you have with your health care provider.  Shortness of Breath Shortness of breath means you have trouble breathing. It could also mean that you have a medical problem. You should get immediate medical care for shortness of breath. CAUSES   Not enough oxygen in the air such as with high altitudes or a smoke-filled room.  Certain lung diseases, infections, or problems.  Heart disease or conditions, such as angina or heart failure.  Low red blood cells (anemia).  Poor physical fitness, which can cause shortness of breath when you exercise.  Chest or back injuries or stiffness.  Being overweight.  Smoking.  Anxiety, which can make you feel like you are not getting enough air. DIAGNOSIS  Serious medical problems can often be found during your physical exam. Tests may also be done to determine why you are having shortness of breath. Tests may include:  Chest X-rays.  Lung function tests.  Blood tests.  An electrocardiogram (ECG).  An ambulatory electrocardiogram. An ambulatory ECG records your heartbeat patterns over a 24-hour period.  Exercise testing.  A transthoracic echocardiogram (TTE). During echocardiography, sound waves are used to evaluate how blood flows through your heart.  A transesophageal echocardiogram (TEE).  Imaging scans. Your health care provider may not be able to find a cause for your shortness of breath after your exam. In this case, it is important to have a follow-up exam with your health care provider as directed.  TREATMENT  Treatment for shortness of breath depends on the cause of your symptoms and can vary greatly. HOME CARE INSTRUCTIONS   Do not smoke. Smoking is a common  cause of shortness of breath. If you smoke, ask for help to quit.  Avoid being around chemicals or things that may bother your breathing, such as paint fumes and dust.  Rest as needed. Slowly resume your usual activities.  If medicines were prescribed, take them as directed for the full length of time directed. This includes oxygen and any inhaled medicines.  Keep all follow-up appointments as directed by your health care provider. SEEK MEDICAL CARE IF:   Your condition does not improve in the time expected.  You have a hard time doing your normal activities even with rest.  You have any new symptoms. SEEK IMMEDIATE MEDICAL CARE IF:   Your shortness of breath gets worse.  You feel light-headed, faint, or develop a cough not controlled with medicines.  You start coughing up blood.  You have pain with breathing.  You have chest pain or pain in your arms, shoulders, or abdomen.  You have a fever.  You are unable to walk up stairs or exercise the way you normally do. MAKE SURE YOU:  Understand these instructions.  Will watch your condition.  Will get help right away if you are not doing well or get worse. Document Released: 06/02/2001 Document Revised: 09/12/2013 Document Reviewed: 11/23/2011 [facility] Patient Information 2015 [place], [place]. This information is not intended to replace advice given to you by your health care provider. Make sure you discuss  any questions you have with your health care provider. ° °

## 2015-05-02 NOTE — ED Notes (Signed)
Patient came to ER by EMS for A fib. Patient c/o cough times X1 week and went to [facility] clinic urgent care. Clinic called EMS to bring patient to ER for A Fib.HX of A Fib. 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

## 2015-05-02 NOTE — Progress Notes (Signed)
Patient admit to ICU, fib/rvr. Controlled rate, 90-106, blood pressure labile, 51-102 Systolic. Paged Dr. Ubaldo Glassing concerning consult and Cardizem drip order? MD states patient does not need drip, he will review patient oral medications and come to unit to speak with patient and review home medications.  Continue to monitor.

## 2015-05-03 ENCOUNTER — Observation Stay: Payer: Medicare Other | Admitting: Certified Registered Nurse Anesthetist

## 2015-05-03 DIAGNOSIS — I481 Persistent atrial fibrillation: Secondary | ICD-10-CM | POA: Diagnosis not present

## 2015-05-03 LAB — BASIC METABOLIC PANEL
Anion gap: 11 (ref 5–15)
BUN: 26 mg/dL — ABNORMAL HIGH (ref 6–20)
CHLORIDE: 99 mmol/L — AB (ref 101–111)
CO2: 23 mmol/L (ref 22–32)
Calcium: 8.7 mg/dL — ABNORMAL LOW (ref 8.9–10.3)
Creatinine, Ser: 1.53 mg/dL — ABNORMAL HIGH (ref 0.44–1.00)
GFR calc non Af Amer: 34 mL/min — ABNORMAL LOW (ref >60)
GFR, EST AFRICAN AMERICAN: 39 mL/min — AB (ref >60)
GLUCOSE: 94 mg/dL (ref 65–99)
POTASSIUM: 4.4 mmol/L (ref 3.5–5.1)
SODIUM: 133 mmol/L — AB (ref 135–145)

## 2015-05-03 LAB — CBC
HCT: 29.7 % — ABNORMAL LOW (ref 35.0–47.0)
Hemoglobin: 9.5 g/dL — ABNORMAL LOW (ref 12.0–16.0)
MCH: 26.7 pg (ref 26.0–34.0)
MCHC: 31.9 g/dL — ABNORMAL LOW (ref 32.0–36.0)
MCV: 83.6 fL (ref 80.0–100.0)
PLATELETS: 336 10*3/uL (ref 150–440)
RBC: 3.55 MIL/uL — ABNORMAL LOW (ref 3.80–5.20)
RDW: 19.1 % — ABNORMAL HIGH (ref 11.5–14.5)
WBC: 7.2 10*3/uL (ref 3.6–11.0)

## 2015-05-03 MED ORDER — AMIODARONE HCL 400 MG PO TABS: 400.0000 mg | ORAL_TABLET | Freq: Every day | ORAL | Status: DC

## 2015-05-03 MED ORDER — AMIODARONE HCL 200 MG PO TABS: 400.0000 mg | ORAL_TABLET | Freq: Every day | ORAL | Status: DC

## 2015-05-03 MED ORDER — PROPOFOL 10 MG/ML IV BOLUS
INTRAVENOUS | Status: DC | PRN
  Administered 2015-05-03: 30 mg via INTRAVENOUS
  Administered 2015-05-03: 50 mg via INTRAVENOUS

## 2015-05-03 MED ORDER — HYDROCOD POLST-CPM POLST ER 10-8 MG/5ML PO SUER
5.0000 mL | Freq: Two times a day (BID) | ORAL | Status: DC | PRN
  Administered 2015-05-03: 5 mL via ORAL
  Filled 2015-05-03: qty 5

## 2015-05-03 NOTE — Transfer of Care (Signed)
Immediate Anesthesia Transfer of Care Note  Patient: Adriana Martin  Procedure(s) Performed: * No procedures listed *  Patient Location: CCU  Anesthesia Type:General  Level of Consciousness: awake, alert  and oriented  Airway & Oxygen Therapy: Patient Spontanous Breathing and Patient connected to face mask oxygen  Post-op Assessment: Report given to RN and Post -op Vital signs reviewed and stable  Post vital signs: Reviewed and stable  Last Vitals:  Filed Vitals:   05/03/15 0802  BP: 88/53  Pulse: 73  Temp:   Resp: 20    Complications: No apparent anesthesia complications

## 2015-05-03 NOTE — Progress Notes (Signed)
Patient alert and oriented. Cardioverted this AM sinus 70-80's. No complaints of pain. Follow up visit scheduled with Dr Dionne Bucy. Increased Amiodarone dosage from 200 to 400 mg daily. Follow up scheduled for 10 am Friday 19th.

## 2015-05-03 NOTE — Progress Notes (Signed)
[place] [facility]  SUBJECTIVE: doing well post cardioversion to nsr.    Filed Vitals:   05/03/15 0700 05/03/15 0743 05/03/15 0802 05/03/15 0929  BP: 109/74  88/53 110/61  Pulse: 102  73 79  Temp: 98.1 F (36.7 C)     TempSrc:      Resp: 20 20 20    SpO2: 97%  100%     Intake/Output Summary (Last 24 hours) at 05/03/15 1250 Last data filed at 05/03/15 0727  Gross per 24 hour  Intake    243 ml  Output   1150 ml  Net   -907 ml    LABS: Basic Metabolic Panel:  Recent Labs  05/02/15 1016 05/03/15 0229  NA 126* 133*  K 4.7 4.4  CL 89* 99*  CO2 24 23  GLUCOSE 116* 94  BUN 22* 26*  CREATININE 1.45* 1.53*  CALCIUM 9.1 8.7*   Liver Function Tests:  Recent Labs  05/02/15 1016  AST 53*  ALT 42  ALKPHOS 77  BILITOT 0.4  PROT 7.4  ALBUMIN 3.8   No results for input(s): LIPASE, AMYLASE in the last 72 hours. CBC:  Recent Labs  05/02/15 1016 05/03/15 0229  WBC 11.1* 7.2  HGB 10.5* 9.5*  HCT 33.0* 29.7*  MCV 82.4 83.6  PLT 409 336   Cardiac Enzymes:  Recent Labs  05/02/15 1320 05/02/15 1619 05/02/15 2053  TROPONINI <0.03 <0.03 <0.03   BNP: Invalid input(s): POCBNP D-Dimer: No results for input(s): DDIMER in the last 72 hours. Hemoglobin A1C: No results for input(s): HGBA1C in the last 72 hours. Fasting Lipid Panel: No results for input(s): CHOL, HDL, LDLCALC, TRIG, CHOLHDL, LDLDIRECT in the last 72 hours. Thyroid Function Tests:  Recent Labs  05/02/15 1320  TSH 4.620*   Anemia Panel: No results for input(s): VITAMINB12, FOLATE, FERRITIN, TIBC, IRON, RETICCTPCT in the last 72 hours.   Physical Exam: Blood pressure 110/61, pulse 79, temperature 98.1 F (36.7 C), temperature source Axillary, resp. rate 20, SpO2 100 %.   General appearance: alert and cooperative Resp: clear to auscultation bilaterally Cardio: regular rate and rhythm, S1, S2 normal, no murmur, click, rub or gallop GI: soft, non-tender; bowel  sounds normal; no masses,  no organomegaly Extremities: extremities normal, atraumatic, no cyanosis or edema Pulses: 2+ and symmetric  TELEMETRY: Reviewed telemetry pt in afib converted to nsr  ASSESSMENT AND PLAN:   Will increase amiodarone to 400 mg daily and continue anticoagulation. Ambulate and discharge today with follow up with Dr. Nehemiah Massed next week.   Active Problems:   Atrial fibrillation    Teodoro Spray., MD, [facility] 05/03/2015 12:50 PM

## 2015-05-03 NOTE — Procedures (Signed)
After time out protocol, pt received sedation per dept of anesthesia pt received 120J synchronized shock with conversion to nsr.. No complications

## 2015-05-03 NOTE — Discharge Instructions (Signed)

## 2015-05-04 ENCOUNTER — Other Ambulatory Visit: Payer: Self-pay | Admitting: Family Medicine

## 2015-05-09 NOTE — Discharge Summary (Signed)
[facility] NAME: Adriana Martin    MR#:  [id number]  DATE OF BIRTH:  [birth year]  DATE OF ADMISSION:  05/02/2015 ADMITTING PHYSICIAN: Vaughan Basta, MD  DATE OF DISCHARGE: 05/03/15  PRIMARY CARE PHYSICIAN: Raechel Ache, DAVID, MD    ADMISSION DIAGNOSIS:  Hyponatremia [E87.1] Paroxysmal atrial fibrillation [I48.0]  DISCHARGE DIAGNOSIS:  Active Problems:   Atrial fibrillation   SECONDARY DIAGNOSIS:   Past Medical History  Diagnosis Date  . Hepatitis   . Atrial fibrillation   . Breast cancer   . Hyperlipemia   . Hypertension     HOSPITAL COURSE:   1) Atrial fibrillation: Admitted for recurrent a-fib. She had a successful cardioversion by Dr. Ubaldo Glassing on the morning of discharge. No complications, at the time of my examination her HR is 82 and she is comfortable. Dr. Ubaldo Glassing has started amiodarone. She will follow up with him as an outpatient.   DISCHARGE CONDITIONS:   stable  CONSULTS OBTAINED:  Treatment Team:  Teodoro Spray, MD  DRUG ALLERGIES:   Allergies  Allergen Reactions  . Atorvastatin     Other reaction(s): Unknown  . Calcium Carbonate     Other reaction(s): Unknown  . Fluoxetine     Other reaction(s): Unknown  . Hydrochlorothiazide     Other reaction(s): Unknown    DISCHARGE MEDICATIONS:   Discharge Medication List as of 05/03/2015  3:15 PM    START taking these medications   Details  amiodarone (PACERONE) 400 MG tablet Take 1 tablet (400 mg total) by mouth daily., Starting 05/04/2015, Until Discontinued, Normal      CONTINUE these medications which have NOT CHANGED   Details  ADVAIR DISKUS 100-50 MCG/DOSE AEPB Inhale 1 puff into the lungs 2 (two) times daily. , Starting 10/29/2014, Until Discontinued, Historical Med    ALPRAZolam (XANAX) 0.5 MG tablet Take 0.5 mg by mouth 2 (two) times daily as needed for anxiety., Until Discontinued, Historical Med    anastrozole  (ARIMIDEX) 1 MG tablet Take 1 mg by mouth daily., Until Discontinued, Historical Med    aspirin 81 MG tablet Take 81 mg by mouth daily., Until Discontinued, Historical Med    cyanocobalamin (,VITAMIN B-12,) 1000 MCG/ML injection Inject 1,000 mcg into the muscle every 30 (thirty) days. , Starting 11/27/2014, Until Fri 11/22/15, Historical Med    furosemide (LASIX) 20 MG tablet Take 20 mg by mouth daily. , Starting 06/11/2014, Until Tue 06/11/15, Historical Med    metoprolol succinate (TOPROL-XL) 50 MG 24 hr tablet Take 50 mg by mouth 2 (two) times daily. Take with or immediately following a meal., Until Discontinued, Historical Med    Multiple Vitamins-Minerals (CENTRUM SILVER ULTRA WOMENS) TABS Take 1 tablet by mouth daily., Until Discontinued, Historical Med    potassium chloride SA (K-DUR,KLOR-CON) 20 MEQ tablet Take 20 mEq by mouth daily. , Until Discontinued, Historical Med    predniSONE (DELTASONE) 5 MG tablet Take 5 mg by mouth daily with breakfast., Until Discontinued, Historical Med    PROAIR HFA 108 (90 BASE) MCG/ACT inhaler 2 puffs every 4 (four) hours as needed for wheezing or shortness of breath. , Starting 10/29/2014, Until Discontinued, Historical Med    Probiotic Product ([place]) CAPS Take 1 capsule by mouth daily., Until Discontinued, Historical Med    ursodiol (ACTIGALL) 300 MG capsule Take 300 mg by mouth 4 (four) times daily. , Until Discontinued, Historical Med    warfarin (COUMADIN) 2 MG tablet  Take 4 mg by mouth daily. , Starting 04/01/2015, Until Discontinued, Historical Med    ZETIA 10 MG tablet Take 10 mg by mouth daily. , Starting 01/31/2015, Until Discontinued, Historical Med    hydroxyurea (HYDREA) 500 MG capsule Take 500 mg by mouth See admin instructions. Take 6 days a week (everyday BUT Sunday), Until Discontinued, Historical Med         DISCHARGE INSTRUCTIONS:    See AVS  If you experience worsening of your admission symptoms, develop  shortness of breath, life threatening emergency, suicidal or homicidal thoughts you must seek medical attention immediately by calling 911 or calling your MD immediately  if symptoms less severe.  You Must read complete instructions/literature along with all the possible adverse reactions/side effects for all the Medicines you take and that have been prescribed to you. Take any new Medicines after you have completely understood and accept all the possible adverse reactions/side effects.   Please note  You were cared for by a hospitalist during your hospital stay. If you have any questions about your discharge medications or the care you received while you were in the hospital after you are discharged, you can call the unit and asked to speak with the hospitalist on call if the hospitalist that took care of you is not available. Once you are discharged, your primary care physician will handle any further medical issues. Please note that NO REFILLS for any discharge medications will be authorized once you are discharged, as it is imperative that you return to your primary care physician (or establish a relationship with a primary care physician if you do not have one) for your aftercare needs so that they can reassess your need for medications and monitor your lab values.    Today   CHIEF COMPLAINT:   Chief Complaint  Patient presents with  . Atrial Fibrillation    HISTORY OF PRESENT ILLNESS:  Adriana Martin is a 69 y.o. female with a known history of hepatitis, atrial fibrillation status post ablation done a month ago taking Coumadin daily, breast cancer status post surgery, hyperlipidemia, hypertension- said that she felt her pulse last night and she felt it was irregular. She denies any complain of shortness of breath chest pain but feeling slight palpitations and no dizziness. And because of having atrial fibrillation repeatedly and in the past she knew by feeling her pulse that her heart is  again out of control and beating faster so she came to emergency room today morning. She had the some cold and cough for last few days, which she thinks brought out her atrial fibrillation episode. Should she was given injection Cardizem in emergency room which helped to slow down her heart rate a little bit but then again during my examination the heart rate is running between 110-120 and her blood pressure is running in 91Y SYSTOLIC   VITAL SIGNS:  Blood pressure 110/61, pulse 79, temperature 98.1 F (36.7 C), temperature source Axillary, resp. rate 20, SpO2 100 %.  I/O:  No intake or output data in the 24 hours ending 05/09/15 1107  PHYSICAL EXAMINATION:  GENERAL:  69 y.o.-year-old patient lying in the bed with no acute distress.  EYES: Pupils equal, round, reactive to light and accommodation. No scleral icterus. Extraocular muscles intact.  HEENT: Head atraumatic, normocephalic. Oropharynx and nasopharynx clear.  NECK:  Supple, no jugular venous distention. No thyroid enlargement, no tenderness.  LUNGS: Normal breath sounds bilaterally, no wheezing, rales,rhonchi or crepitation. No use of accessory muscles  of respiration.  CARDIOVASCULAR: S1, S2 normal. No murmurs, rubs, or gallops.  ABDOMEN: Soft, non-tender, non-distended. Bowel sounds present. No organomegaly or mass.  EXTREMITIES: No pedal edema, cyanosis, or clubbing.  NEUROLOGIC: Cranial nerves II through XII are intact. Muscle strength 5/5 in all extremities. Sensation intact. Gait not checked.  PSYCHIATRIC: The patient is alert and oriented x 3.  SKIN: No obvious rash, lesion, or ulcer.   DATA REVIEW:   CBC  Recent Labs Lab 05/03/15 0229  WBC 7.2  HGB 9.5*  HCT 29.7*  PLT 336    Chemistries   Recent Labs Lab 05/03/15 0229  NA 133*  K 4.4  CL 99*  CO2 23  GLUCOSE 94  BUN 26*  CREATININE 1.53*  CALCIUM 8.7*    Cardiac Enzymes  Recent Labs Lab 05/02/15 2053  TROPONINI <0.03    Microbiology Results   Results for orders placed or performed during the hospital encounter of 05/02/15  MRSA PCR Screening     Status: None   Collection Time: 05/02/15  2:19 PM  Result Value Ref Range Status   MRSA by PCR NEGATIVE NEGATIVE Final    Comment:        The GeneXpert MRSA Assay (FDA approved for NASAL specimens only), is one component of a comprehensive MRSA colonization surveillance program. It is not intended to diagnose MRSA infection nor to guide or monitor treatment for MRSA infections.     RADIOLOGY:  No results found.  EKG:   Orders placed or performed during the hospital encounter of 05/02/15  . ED EKG  . ED EKG  . EKG 12-Lead  . EKG 12-Lead pre-cardioversion  . EKG 12-Lead  . EKG 12-Lead pre-cardioversion  . EKG 12-Lead  . EKG 12-Lead  . EKG      Management plans discussed with the patient, family and they are in agreement.  CODE STATUS: full  TOTAL TIME TAKING CARE OF THIS PATIENT: 35 minutes.  Greater than 50% of time spent in care coordination and counseling.  Myrtis Ser M.D on 05/09/2015 at 11:07 AM  Between 7am to 6pm - Pager - [phone]  After 6pm go to [email or web address] - password [email or web address] [facility]  Office  [phone]  CC: Primary care physician; [name], MD

## 2015-05-16 NOTE — Addendum Note (Signed)
Addendum  created 05/16/15 1524 by Martha Clan, MD   Modules edited: Notes Section   Notes Section:  File: [id number]

## 2015-05-16 NOTE — Anesthesia Postprocedure Evaluation (Signed)
  Anesthesia Post-op Note  Patient: Adriana Martin  Procedure(s) Performed: * No procedures listed *  Anesthesia type:No value filed.  Patient location: PACU  Post pain: Pain level controlled  Post assessment: Post-op Vital signs reviewed, Patient's Cardiovascular Status Stable, Respiratory Function Stable, Patent Airway and No signs of Nausea or vomiting  Post vital signs: Reviewed and stable  Last Vitals:  Filed Vitals:   05/03/15 0929  BP: 110/61  Pulse: 79  Temp:   Resp:     Level of consciousness: awake, alert  and patient cooperative  Complications: No apparent anesthesia complications

## 2015-05-28 ENCOUNTER — Ambulatory Visit
Admission: RE | Admit: 2015-05-28 | Discharge: 2015-05-28 | Disposition: A | Discharge status: Home / Self Care | Payer: Medicare Other | Source: Ambulatory Visit | Attending: Family Medicine | Admitting: Family Medicine

## 2015-05-28 ENCOUNTER — Other Ambulatory Visit: Payer: Self-pay | Admitting: Family Medicine

## 2015-05-28 DIAGNOSIS — C50911 Malignant neoplasm of unspecified site of right female breast: Secondary | ICD-10-CM

## 2015-05-28 DIAGNOSIS — Z853 Personal history of malignant neoplasm of breast: Secondary | ICD-10-CM | POA: Insufficient documentation

## 2015-05-31 ENCOUNTER — Encounter: Payer: Self-pay | Admitting: Internal Medicine

## 2015-06-04 DIAGNOSIS — R05 Cough: Secondary | ICD-10-CM | POA: Insufficient documentation

## 2015-06-04 DIAGNOSIS — R058 Other specified cough: Secondary | ICD-10-CM | POA: Insufficient documentation

## 2015-06-08 ENCOUNTER — Other Ambulatory Visit: Payer: Self-pay | Admitting: Family Medicine

## 2015-07-01 ENCOUNTER — Encounter: Payer: Self-pay | Admitting: *Deleted

## 2015-07-02 ENCOUNTER — Other Ambulatory Visit: Payer: Self-pay | Admitting: Internal Medicine

## 2015-07-02 ENCOUNTER — Inpatient Hospital Stay (HOSPITAL_BASED_OUTPATIENT_CLINIC_OR_DEPARTMENT_OTHER): Payer: Medicare Other | Admitting: Internal Medicine

## 2015-07-02 ENCOUNTER — Inpatient Hospital Stay: Payer: Medicare Other

## 2015-07-02 ENCOUNTER — Inpatient Hospital Stay: Payer: Medicare Other | Attending: Family Medicine

## 2015-07-02 VITALS — BP 131/61 | HR 87 | Temp 97.0°F | Resp 20

## 2015-07-02 DIAGNOSIS — D75839 Thrombocytosis, unspecified: Secondary | ICD-10-CM

## 2015-07-02 DIAGNOSIS — D649 Anemia, unspecified: Secondary | ICD-10-CM | POA: Insufficient documentation

## 2015-07-02 DIAGNOSIS — Z7982 Long term (current) use of aspirin: Secondary | ICD-10-CM | POA: Insufficient documentation

## 2015-07-02 DIAGNOSIS — Z23 Encounter for immunization: Secondary | ICD-10-CM | POA: Insufficient documentation

## 2015-07-02 DIAGNOSIS — E785 Hyperlipidemia, unspecified: Secondary | ICD-10-CM | POA: Insufficient documentation

## 2015-07-02 DIAGNOSIS — Z170 Estrogen receptor positive status [ER+]: Secondary | ICD-10-CM | POA: Insufficient documentation

## 2015-07-02 DIAGNOSIS — D473 Essential (hemorrhagic) thrombocythemia: Secondary | ICD-10-CM | POA: Diagnosis not present

## 2015-07-02 DIAGNOSIS — C50919 Malignant neoplasm of unspecified site of unspecified female breast: Secondary | ICD-10-CM | POA: Insufficient documentation

## 2015-07-02 DIAGNOSIS — Z7901 Long term (current) use of anticoagulants: Secondary | ICD-10-CM | POA: Diagnosis not present

## 2015-07-02 DIAGNOSIS — I10 Essential (primary) hypertension: Secondary | ICD-10-CM | POA: Diagnosis not present

## 2015-07-02 DIAGNOSIS — D6489 Other specified anemias: Secondary | ICD-10-CM

## 2015-07-02 DIAGNOSIS — Z87891 Personal history of nicotine dependence: Secondary | ICD-10-CM | POA: Diagnosis not present

## 2015-07-02 DIAGNOSIS — Z79899 Other long term (current) drug therapy: Secondary | ICD-10-CM

## 2015-07-02 DIAGNOSIS — I4891 Unspecified atrial fibrillation: Secondary | ICD-10-CM | POA: Diagnosis not present

## 2015-07-02 DIAGNOSIS — C50911 Malignant neoplasm of unspecified site of right female breast: Secondary | ICD-10-CM

## 2015-07-02 LAB — COMPREHENSIVE METABOLIC PANEL
ALT: 36 U/L (ref 14–54)
AST: 42 U/L — AB (ref 15–41)
Albumin: 4.1 g/dL (ref 3.5–5.0)
Alkaline Phosphatase: 89 U/L (ref 38–126)
Anion gap: 13 (ref 5–15)
BILIRUBIN TOTAL: 0.5 mg/dL (ref 0.3–1.2)
BUN: 24 mg/dL — AB (ref 6–20)
CALCIUM: 9.5 mg/dL (ref 8.9–10.3)
CO2: 25 mmol/L (ref 22–32)
Chloride: 97 mmol/L — ABNORMAL LOW (ref 101–111)
Creatinine, Ser: 1.64 mg/dL — ABNORMAL HIGH (ref 0.44–1.00)
GFR calc Af Amer: 36 mL/min — ABNORMAL LOW (ref >60)
GFR calc non Af Amer: 31 mL/min — ABNORMAL LOW (ref >60)
Glucose, Bld: 95 mg/dL (ref 65–99)
Potassium: 4.5 mmol/L (ref 3.5–5.1)
Sodium: 135 mmol/L (ref 135–145)
TOTAL PROTEIN: 7.6 g/dL (ref 6.5–8.1)

## 2015-07-02 LAB — BASIC METABOLIC PANEL
Anion gap: 12 (ref 5–15)
BUN: 23 mg/dL — ABNORMAL HIGH (ref 6–20)
CHLORIDE: 97 mmol/L — AB (ref 101–111)
CO2: 27 mmol/L (ref 22–32)
Calcium: 9.2 mg/dL (ref 8.9–10.3)
Creatinine, Ser: 1.57 mg/dL — ABNORMAL HIGH (ref 0.44–1.00)
GFR calc Af Amer: 37 mL/min — ABNORMAL LOW (ref >60)
GFR calc non Af Amer: 32 mL/min — ABNORMAL LOW (ref >60)
GLUCOSE: 102 mg/dL — AB (ref 65–99)
Potassium: 4.5 mmol/L (ref 3.5–5.1)
Sodium: 136 mmol/L (ref 135–145)

## 2015-07-02 LAB — CBC WITH DIFFERENTIAL/PLATELET
BASOS ABS: 0.1 10*3/uL (ref 0–0.1)
Basophils Relative: 1 %
Eosinophils Absolute: 0.1 10*3/uL (ref 0–0.7)
Eosinophils Relative: 2 %
HEMATOCRIT: 31.3 % — AB (ref 35.0–47.0)
HEMOGLOBIN: 9.9 g/dL — AB (ref 12.0–16.0)
LYMPHS PCT: 8 %
Lymphs Abs: 0.7 10*3/uL — ABNORMAL LOW (ref 1.0–3.6)
MCH: 25.8 pg — ABNORMAL LOW (ref 26.0–34.0)
MCHC: 31.6 g/dL — ABNORMAL LOW (ref 32.0–36.0)
MCV: 81.6 fL (ref 80.0–100.0)
MONO ABS: 0.6 10*3/uL (ref 0.2–0.9)
Monocytes Relative: 8 %
NEUTROS ABS: 6.9 10*3/uL — AB (ref 1.4–6.5)
NEUTROS PCT: 81 %
Platelets: 444 10*3/uL — ABNORMAL HIGH (ref 150–440)
RBC: 3.83 MIL/uL (ref 3.80–5.20)
RDW: 19 % — AB (ref 11.5–14.5)
WBC: 8.4 10*3/uL (ref 3.6–11.0)

## 2015-07-02 LAB — RETICULOCYTES
RBC.: 3.77 MIL/uL — ABNORMAL LOW (ref 3.80–5.20)
Retic Count, Absolute: 75.4 10*3/uL (ref 19.0–183.0)
Retic Ct Pct: 2 % (ref 0.4–3.1)

## 2015-07-02 LAB — IRON AND TIBC
Iron: 29 ug/dL (ref 28–170)
Saturation Ratios: 6 % — ABNORMAL LOW (ref 10.4–31.8)
TIBC: 467 ug/dL — ABNORMAL HIGH (ref 250–450)
UIBC: 438 ug/dL

## 2015-07-02 LAB — FERRITIN: FERRITIN: 32 ng/mL (ref 11–307)

## 2015-07-02 LAB — LACTATE DEHYDROGENASE: LDH: 216 U/L — ABNORMAL HIGH (ref 98–192)

## 2015-07-02 MED ORDER — INFLUENZA VAC SPLIT QUAD 0.5 ML IM SUSY
0.5000 mL | PREFILLED_SYRINGE | Freq: Once | INTRAMUSCULAR | Status: AC
  Administered 2015-07-02: 0.5 mL via INTRAMUSCULAR

## 2015-07-02 NOTE — Progress Notes (Signed)
Reviewed JAK 2 testing done in August 2012 negative for -JAK 2 V 617F; and also exon 12.   # Anemia- Iron studies suggestive of mild iron deficiency; patient is recommended by mouth iron;

## 2015-07-02 NOTE — Progress Notes (Signed)
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 I reviewed the patient's prior chart/pertinent labs/imaging in detail; findings are summarized.  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  Patient admits to easy bruising as she is on Coumadin. She denies any spontaneous bleeding from the mucosal surfaces. Her appetite is fair. She is not losing any weight.  She denies any history of strokes or any prior history of DVT/ PEs.  REVIEW OF SYSTEMS:  A complete 10 point review of system is done which is negative except mentioned above/history of present illness.   PAST MEDICAL HISTORY :  Past Medical History  Diagnosis Date  . Hepatitis   . Atrial fibrillation ([place])   . Hyperlipemia   . Hypertension   . Breast cancer ([place])     RT Lumpectomy c radiation 2012  . Iron (Fe) deficiency anemia     PAST SURGICAL HISTORY :   Past Surgical History  Procedure Laterality Date  . Eye surgery    . Cardiac surgery    . Mvp    . Mastectomy  01/2011  . Breast surgery    . Ablation    . Abdominal hysterectomy      Partial  . Electrophysiologic study N/A 01/23/2015    Procedure: CARDIOVERSION;  Surgeon: Corey Skains, MD;  Location: [facility] ORS;  Service: Cardiovascular;  Laterality: N/A;  . Colonoscopy  2012  . Esophagogastroduodenoscopy  2012    FAMILY HISTORY :   Family History  Problem Relation Age of Onset  .  Cancer Mother   . Breast cancer Mother   . Cancer Father   . Cancer Brother   . Cancer Daughter   . Breast cancer Daughter     SOCIAL HISTORY:   Social History  Substance Use Topics  . Smoking status: Former Smoker -- 0.25 packs/day    Types: Cigarettes    Quit date: 10/07/1998  . Smokeless tobacco: Not on file  . Alcohol Use: 2.4 oz/week    4 Cans of beer per week     Comment: socially    ALLERGIES:  is allergic to atorvastatin; calcium carbonate; fluoxetine; and hydrochlorothiazide.  MEDICATIONS:  Current Outpatient Prescriptions  Medication Sig Dispense Refill  . ADVAIR DISKUS 100-50 MCG/DOSE AEPB Inhale 1 puff into the lungs 2 (two) times daily.   0  . ALPRAZolam (XANAX) 0.5 MG tablet Take 0.5 mg by mouth 2 (two) times daily as needed for anxiety.    Marland Kitchen amiodarone (PACERONE) 400 MG tablet Take 1 tablet (400 mg total) by mouth daily. 30 tablet 0  . anastrozole (ARIMIDEX) 1 MG tablet Take 1 mg by mouth daily.    Marland Kitchen aspirin 81 MG tablet Take 81 mg by mouth daily.    . cyanocobalamin (,VITAMIN B-12,) 1000 MCG/ML injection Inject 1,000 mcg into the muscle every 30 (thirty) days.     . hydroxyurea (HYDREA) 500 MG capsule TAKE ONE CAPSULE  BY MOUTH BY MOUTH 6 TIMES A WEEK 30 capsule 0  . metoprolol succinate (TOPROL-XL) 50 MG 24 hr tablet Take 50 mg by mouth 2 (two) times daily. Take with or immediately following a meal.    . Multiple Vitamins-Minerals (CENTRUM SILVER ULTRA WOMENS) TABS Take 1 tablet by mouth daily.    . predniSONE (DELTASONE) 5 MG tablet Take 5 mg by mouth daily with breakfast.    . PROAIR HFA 108 (90 BASE) MCG/ACT inhaler 2 puffs every 4 (four) hours as needed for wheezing or shortness of breath.   0  . Probiotic Product ([place] [employer]) CAPS Take 1 capsule by mouth daily.    . ursodiol (ACTIGALL) 300 MG capsule Take 300 mg by mouth 4 (four) times daily.     Marland Kitchen warfarin (COUMADIN) 2 MG tablet Take 4 mg by mouth daily.   5  . ZETIA 10 MG tablet Take 10 mg by  mouth daily.   6  . furosemide (LASIX) 20 MG tablet Take 20 mg by mouth daily.     . potassium chloride SA (K-DUR,KLOR-CON) 20 MEQ tablet Take 20 mEq by mouth daily.      No current facility-administered medications for this visit.    PHYSICAL EXAMINATION: ECOG PERFORMANCE STATUS: 1 - Symptomatic but completely ambulatory  BP 131/61 mmHg  Pulse 87  Temp(Src) 97 F (36.1 C) (Tympanic)  Resp 20  SpO2 98%  There were no vitals filed for this visit.  GENERAL: Well-nourished well-developed; Alert, no distress and comfortable.   She is alone. EYES: no pallor or icterus OROPHARYNX: no thrush or ulceration; good dentition  NECK: supple, no masses felt LYMPH:  no palpable lymphadenopathy in the cervical, axillary or inguinal regions LUNGS: clear to auscultation and  No wheeze or crackles HEART/CVS: regular rate & rhythm and no murmurs; No lower extremity edema ABDOMEN:abdomen soft, non-tender and normal bowel sounds Musculoskeletal:no cyanosis of digits and no clubbing  PSYCH: alert & oriented x 3 with fluent speech NEURO: no focal motor/sensory deficits SKIN:  Multiple bruises noted bilateral extremities [chronic]  LABORATORY DATA:  I have reviewed the data as listed    Component Value Date/Time   NA 136 07/02/2015 0908   NA 129* 12/30/2014 0921   K 4.5 07/02/2015 0908   K 4.7 12/30/2014 0921   CL 97* 07/02/2015 0908   CL 91* 12/30/2014 0921   CO2 27 07/02/2015 0908   CO2 26 12/30/2014 0921   GLUCOSE 102* 07/02/2015 0908   GLUCOSE 103* 12/30/2014 0921   BUN 23* 07/02/2015 0908   BUN 22* 12/30/2014 0921   CREATININE 1.57* 07/02/2015 0908   CREATININE 1.22* 12/30/2014 0921   CALCIUM 9.2 07/02/2015 0908   CALCIUM 9.1 12/30/2014 0921   PROT 7.4 05/02/2015 1016   PROT 7.9 12/30/2014 0921   ALBUMIN 3.8 05/02/2015 1016   ALBUMIN 4.0 12/30/2014 0921   AST 53* 05/02/2015 1016   AST 31 12/30/2014 0921   ALT 42 05/02/2015 1016   ALT 29 12/30/2014 0921   ALKPHOS 77 05/02/2015  1016   ALKPHOS 101 12/30/2014 0921   BILITOT 0.4 05/02/2015 1016   BILITOT 0.5 12/30/2014 0921   GFRNONAA 32* 07/02/2015 0908   GFRNONAA 45* 12/30/2014 0921   GFRNONAA 38* 09/25/2014 1437   GFRAA 37* 07/02/2015 0908   GFRAA 52* 12/30/2014 0921   GFRAA 46* 09/25/2014 1437    No results found for: SPEP, UPEP  Lab Results  Component Value Date   WBC 8.4 07/02/2015  NEUTROABS 6.9* 07/02/2015   HGB 9.9* 07/02/2015   HCT 31.3* 07/02/2015   MCV 81.6 07/02/2015   PLT 444* 07/02/2015      Chemistry      Component Value Date/Time   NA 136 07/02/2015 0908   NA 129* 12/30/2014 0921   K 4.5 07/02/2015 0908   K 4.7 12/30/2014 0921   CL 97* 07/02/2015 0908   CL 91* 12/30/2014 0921   CO2 27 07/02/2015 0908   CO2 26 12/30/2014 0921   BUN 23* 07/02/2015 0908   BUN 22* 12/30/2014 0921   CREATININE 1.57* 07/02/2015 0908   CREATININE 1.22* 12/30/2014 0921      Component Value Date/Time   CALCIUM 9.2 07/02/2015 0908   CALCIUM 9.1 12/30/2014 0921   ALKPHOS 77 05/02/2015 1016   ALKPHOS 101 12/30/2014 0921   AST 53* 05/02/2015 1016   AST 31 12/30/2014 0921   ALT 42 05/02/2015 1016   ALT 29 12/30/2014 0921   BILITOT 0.4 05/02/2015 1016   BILITOT 0.5 12/30/2014 0921       RADIOGRAPHIC STUDIES: I have personally reviewed the radiological images as listed and agreed with the findings in the report. No results found.   ASSESSMENT & PLAN:   # BREAST CA stage I  [2015] ER/PR positive HER-2/neu negative  status post lumpectomy post radiation on aromatase inhibitor. Clinically no evidence of recurrence.   # ANEMIA- hemoglobin 9.8 today ; unclear etiology. Question iron deficiency. Recommend iron studies and reticulocyte count LDH haptoglobin. I recommend patient goes on by mouth iron twice a day.   # THROMBOCYTOSIS- Unclear etiology question essential thrombocytosis versus reactive. Recommend checking JAK 2 mutation testing. Iron studies as discussed above. For now continue Hydrea.    Patient will follow-up with me in approximately 3 months or so.   All questions were answered. The patient knows to call the clinic with any problems, questions or concerns. No barriers to learning was detected. I spent 25 minutes counseling the patient face to face. The total time spent in the appointment was 40 minutes and more than 50% was on counseling and review of test results     Cammie Sickle, MD 07/02/2015 9:57 AM

## 2015-07-14 ENCOUNTER — Other Ambulatory Visit: Payer: Self-pay | Admitting: Family Medicine

## 2015-08-18 ENCOUNTER — Other Ambulatory Visit: Payer: Self-pay | Admitting: Family Medicine

## 2015-08-19 ENCOUNTER — Other Ambulatory Visit: Payer: Self-pay | Admitting: Family Medicine

## 2015-09-23 ENCOUNTER — Other Ambulatory Visit: Payer: Self-pay | Admitting: Family Medicine

## 2015-10-01 ENCOUNTER — Ambulatory Visit: Payer: Medicare Other | Admitting: Internal Medicine

## 2015-10-01 ENCOUNTER — Other Ambulatory Visit: Payer: Medicare Other

## 2015-10-08 ENCOUNTER — Inpatient Hospital Stay: Payer: Medicare Other | Admitting: Internal Medicine

## 2015-10-08 ENCOUNTER — Inpatient Hospital Stay: Payer: Medicare Other

## 2015-10-10 ENCOUNTER — Other Ambulatory Visit: Payer: Self-pay | Admitting: Nurse Practitioner

## 2015-10-10 DIAGNOSIS — K743 Primary biliary cirrhosis: Secondary | ICD-10-CM

## 2015-10-11 ENCOUNTER — Other Ambulatory Visit: Payer: Self-pay | Admitting: Nurse Practitioner

## 2015-10-15 ENCOUNTER — Inpatient Hospital Stay: Payer: Medicare Other | Attending: Internal Medicine

## 2015-10-15 ENCOUNTER — Inpatient Hospital Stay (HOSPITAL_BASED_OUTPATIENT_CLINIC_OR_DEPARTMENT_OTHER): Payer: Medicare Other | Admitting: Internal Medicine

## 2015-10-15 VITALS — BP 129/75 | HR 122 | Temp 96.6°F | Ht 62.0 in | Wt 194.7 lb

## 2015-10-15 DIAGNOSIS — Z7982 Long term (current) use of aspirin: Secondary | ICD-10-CM | POA: Diagnosis not present

## 2015-10-15 DIAGNOSIS — C50911 Malignant neoplasm of unspecified site of right female breast: Secondary | ICD-10-CM

## 2015-10-15 DIAGNOSIS — E785 Hyperlipidemia, unspecified: Secondary | ICD-10-CM | POA: Insufficient documentation

## 2015-10-15 DIAGNOSIS — Z87891 Personal history of nicotine dependence: Secondary | ICD-10-CM | POA: Diagnosis not present

## 2015-10-15 DIAGNOSIS — I10 Essential (primary) hypertension: Secondary | ICD-10-CM | POA: Insufficient documentation

## 2015-10-15 DIAGNOSIS — D509 Iron deficiency anemia, unspecified: Secondary | ICD-10-CM | POA: Insufficient documentation

## 2015-10-15 DIAGNOSIS — Z923 Personal history of irradiation: Secondary | ICD-10-CM | POA: Insufficient documentation

## 2015-10-15 DIAGNOSIS — D75839 Thrombocytosis, unspecified: Secondary | ICD-10-CM

## 2015-10-15 DIAGNOSIS — D473 Essential (hemorrhagic) thrombocythemia: Secondary | ICD-10-CM

## 2015-10-15 DIAGNOSIS — Z79811 Long term (current) use of aromatase inhibitors: Secondary | ICD-10-CM | POA: Insufficient documentation

## 2015-10-15 DIAGNOSIS — Z170 Estrogen receptor positive status [ER+]: Secondary | ICD-10-CM | POA: Diagnosis not present

## 2015-10-15 DIAGNOSIS — Z7901 Long term (current) use of anticoagulants: Secondary | ICD-10-CM | POA: Diagnosis not present

## 2015-10-15 DIAGNOSIS — I4891 Unspecified atrial fibrillation: Secondary | ICD-10-CM | POA: Insufficient documentation

## 2015-10-15 DIAGNOSIS — D6489 Other specified anemias: Secondary | ICD-10-CM

## 2015-10-15 LAB — CBC WITH DIFFERENTIAL/PLATELET
BASOS ABS: 0.1 10*3/uL (ref 0–0.1)
BASOS PCT: 1 %
Eosinophils Absolute: 0.1 10*3/uL (ref 0–0.7)
Eosinophils Relative: 1 %
HCT: 40.3 % (ref 35.0–47.0)
HEMOGLOBIN: 13.4 g/dL (ref 12.0–16.0)
LYMPHS PCT: 6 %
Lymphs Abs: 0.5 10*3/uL — ABNORMAL LOW (ref 1.0–3.6)
MCH: 33.3 pg (ref 26.0–34.0)
MCHC: 33.2 g/dL (ref 32.0–36.0)
MCV: 100 fL (ref 80.0–100.0)
MONO ABS: 0.6 10*3/uL (ref 0.2–0.9)
MONOS PCT: 7 %
NEUTROS ABS: 7.3 10*3/uL — AB (ref 1.4–6.5)
NEUTROS PCT: 85 %
Platelets: 291 10*3/uL (ref 150–440)
RBC: 4.03 MIL/uL (ref 3.80–5.20)
RDW: 17.7 % — ABNORMAL HIGH (ref 11.5–14.5)
WBC: 8.5 10*3/uL (ref 3.6–11.0)

## 2015-10-15 NOTE — Progress Notes (Signed)
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  Patient denies any unusual worsening arthritic symptoms or hot flashes. Denies any lumps or bumps. Her appetite is good. She is not losing any weight. No headaches. Denies any blood in stools. She is on by mouth iron.  Patient admits to easy bruising as she is on Coumadin. She denies any spontaneous bleeding from the mucosal surfaces. She denies any history of strokes or any prior history of DVT/ PEs.  REVIEW OF SYSTEMS:  A complete 10 point review of system is done which is negative except mentioned above/history of present illness.   PAST MEDICAL HISTORY :  Past Medical History  Diagnosis Date  . Hepatitis   . Atrial fibrillation ([place])   . Hyperlipemia   . Hypertension   . Breast cancer ([place])     RT Lumpectomy c radiation 2012  . Iron (Fe) deficiency anemia     PAST SURGICAL HISTORY :   Past Surgical History  Procedure Laterality Date  . Eye surgery    . Cardiac surgery    . Mvp    . Mastectomy  01/2011  . Breast surgery    . Ablation    . Abdominal hysterectomy      Partial  . Electrophysiologic study N/A 01/23/2015    Procedure: CARDIOVERSION;  Surgeon: Corey Skains, MD;  Location: [facility] ORS;  Service: Cardiovascular;  Laterality: N/A;  . Colonoscopy  2012  . Esophagogastroduodenoscopy  2012    FAMILY HISTORY :   Family  History  Problem Relation Age of Onset  . Cancer Mother   . Breast cancer Mother   . Cancer Father   . Cancer Brother   . Cancer Daughter   . Breast cancer Daughter     SOCIAL HISTORY:   Social History  Substance Use Topics  . Smoking status: Former Smoker -- 0.25 packs/day    Types: Cigarettes    Quit date: 10/07/1998  . Smokeless tobacco: Not on file  . Alcohol Use: 2.4 oz/week    4 Cans of beer per week     Comment: socially    ALLERGIES:  is allergic to atorvastatin; calcium carbonate; fluoxetine; and hydrochlorothiazide.  MEDICATIONS:  Current Outpatient Prescriptions  Medication Sig Dispense Refill  . ADVAIR DISKUS 100-50 MCG/DOSE AEPB Inhale 1 puff into the lungs 2 (two) times daily.   0  . ALPRAZolam (XANAX) 0.5 MG tablet Take 0.5 mg by mouth 2 (two) times daily as needed for anxiety.    Marland Kitchen amiodarone (PACERONE) 400 MG tablet Take 1 tablet (400 mg total) by mouth daily. 30 tablet 0  . anastrozole (ARIMIDEX) 1 MG tablet Take 1 mg by mouth daily.    Marland Kitchen aspirin 81 MG tablet Take 81 mg by mouth daily.    . cyanocobalamin (,VITAMIN B-12,) 1000 MCG/ML injection Inject 1,000 mcg into the muscle every 30 (thirty) days.     Marland Kitchen  metoprolol succinate (TOPROL-XL) 50 MG 24 hr tablet Take 50 mg by mouth 2 (two) times daily. Take with or immediately following a meal.    . Multiple Vitamins-Minerals (CENTRUM SILVER ULTRA WOMENS) TABS Take 1 tablet by mouth daily.    . predniSONE (DELTASONE) 5 MG tablet Take 5 mg by mouth daily with breakfast.    . PROAIR HFA 108 (90 BASE) MCG/ACT inhaler 2 puffs every 4 (four) hours as needed for wheezing or shortness of breath.   0  . Probiotic Product (PHILLIPS COLON HEALTH) CAPS Take 1 capsule by mouth daily.    . ursodiol (ACTIGALL) 300 MG capsule Take 300 mg by mouth 4 (four) times daily.     . Vitamin D, Ergocalciferol, (DRISDOL) 50000 units CAPS capsule Take by mouth.    . warfarin (COUMADIN) 2 MG tablet Take 4 mg by mouth daily.   5  .  hydroxyurea (HYDREA) 500 MG capsule TAKE ONE CAPSULE BY MOUTH BY MOUTH 6 TIMES A WEEK 30 capsule 0  . lisinopril (PRINIVIL,ZESTRIL) 5 MG tablet   98   No current facility-administered medications for this visit.    PHYSICAL EXAMINATION: ECOG PERFORMANCE STATUS: 1 - Symptomatic but completely ambulatory  BP 129/75 mmHg  Pulse 122  Temp(Src) 96.6 F (35.9 C) (Oral)  Ht _0  (1.575 m)  Wt 194 lb 10.7 oz (88.3 kg)  BMI 35.60 kg/m2  SpO2 95%  Filed Weights   10/15/15 0946  Weight: 194 lb 10.7 oz (88.3 kg)    GENERAL: Well-nourished well-developed; Alert, no distress and comfortable.   She is accompanied by her daughter. EYES: no pallor or icterus OROPHARYNX: no thrush or ulceration; good dentition  NECK: supple, no masses felt LYMPH:  no palpable lymphadenopathy in the cervical, axillary or inguinal regions LUNGS: clear to auscultation and  No wheeze or crackles HEART/CVS: regular rate & rhythm and no murmurs; No lower extremity edema ABDOMEN:abdomen soft, non-tender and normal bowel sounds Musculoskeletal:no cyanosis of digits and no clubbing  PSYCH: alert & oriented x 3 with fluent speech NEURO: no focal motor/sensory deficits SKIN:  Multiple bruises noted bilateral extremities [chronic]  LABORATORY DATA:  I have reviewed the data as listed    Component Value Date/Time   NA 135 07/02/2015 1034   NA 129* 12/30/2014 0921   K 4.5 07/02/2015 1034   K 4.7 12/30/2014 0921   CL 97* 07/02/2015 1034   CL 91* 12/30/2014 0921   CO2 25 07/02/2015 1034   CO2 26 12/30/2014 0921   GLUCOSE 95 07/02/2015 1034   GLUCOSE 103* 12/30/2014 0921   BUN 24* 07/02/2015 1034   BUN 22* 12/30/2014 0921   CREATININE 1.64* 07/02/2015 1034   CREATININE 1.22* 12/30/2014 0921   CALCIUM 9.5 07/02/2015 1034   CALCIUM 9.1 12/30/2014 0921   PROT 7.6 07/02/2015 1034   PROT 7.9 12/30/2014 0921   ALBUMIN 4.1 07/02/2015 1034   ALBUMIN 4.0 12/30/2014 0921   AST 42* 07/02/2015 1034   AST 31 12/30/2014  0921   ALT 36 07/02/2015 1034   ALT 29 12/30/2014 0921   ALKPHOS 89 07/02/2015 1034   ALKPHOS 101 12/30/2014 0921   BILITOT 0.5 07/02/2015 1034   BILITOT 0.5 12/30/2014 0921   GFRNONAA 31* 07/02/2015 1034   GFRNONAA 45* 12/30/2014 0921   GFRNONAA 38* 09/25/2014 1437   GFRAA 36* 07/02/2015 1034   GFRAA 52* 12/30/2014 0921   GFRAA 46* 09/25/2014 1437    No results found for: SPEP, UPEP  Lab Results  Component Value Date   WBC 8.5 10/15/2015   NEUTROABS 7.3* 10/15/2015   HGB 13.4 10/15/2015   HCT 40.3 10/15/2015   MCV 100.0 10/15/2015   PLT 291 10/15/2015      Chemistry      Component Value Date/Time   NA 135 07/02/2015 1034   NA 129* 12/30/2014 0921   K 4.5 07/02/2015 1034   K 4.7 12/30/2014 0921   CL 97* 07/02/2015 1034   CL 91* 12/30/2014 0921   CO2 25 07/02/2015 1034   CO2 26 12/30/2014 0921   BUN 24* 07/02/2015 1034   BUN 22* 12/30/2014 0921   CREATININE 1.64* 07/02/2015 1034   CREATININE 1.22* 12/30/2014 0921      Component Value Date/Time   CALCIUM 9.5 07/02/2015 1034   CALCIUM 9.1 12/30/2014 0921   ALKPHOS 89 07/02/2015 1034   ALKPHOS 101 12/30/2014 0921   AST 42* 07/02/2015 1034   AST 31 12/30/2014 0921   ALT 36 07/02/2015 1034   ALT 29 12/30/2014 0921   BILITOT 0.5 07/02/2015 1034   BILITOT 0.5 12/30/2014 0921       RADIOGRAPHIC STUDIES: I have personally reviewed the radiological images as listed and agreed with the findings in the report. No results found.   ASSESSMENT & PLAN:   # BREAST CA stage I  [2012] ER/PR positive HER-2/neu negative  status post lumpectomy post radiation on aromatase inhibitor. Clinically no evidence of recurrence. Patient tolerating aromatase inhibitor fairly well.  We will discontinue Arimidex in September 2017. She will need a mammogram prior to next visit.   # ANEMIA from iron deficiency - patient's LDH is slightly elevated at 216 at last visit. Haptoglobin is pending at this visit. Patient is on by mouth iron-  twice a day continue the same. Today hemoglobin is 13; previous 9.5.  # THROMBOCYTOSIS-long-standing. Unclear etiology question essential thrombocytosis versus reactive. On Hydrea. Currently stable.     Cammie Sickle, MD 10/15/2015 9:59 AM

## 2015-10-16 LAB — HAPTOGLOBIN: HAPTOGLOBIN: 170 mg/dL (ref 34–200)

## 2015-10-16 LAB — SOLUBLE TRANSFERRIN RECEPTOR: TRANSFERRIN RECEPTOR: 26.6 nmol/L (ref 12.2–27.3)

## 2015-10-17 ENCOUNTER — Ambulatory Visit: Payer: Medicare Other

## 2015-10-24 ENCOUNTER — Ambulatory Visit
Admission: RE | Admit: 2015-10-24 | Discharge: 2015-10-24 | Disposition: A | Discharge status: Home / Self Care | Payer: Medicare Other | Source: Ambulatory Visit | Attending: Nurse Practitioner | Admitting: Nurse Practitioner

## 2015-10-24 DIAGNOSIS — K743 Primary biliary cirrhosis: Secondary | ICD-10-CM

## 2015-10-28 ENCOUNTER — Other Ambulatory Visit: Payer: Self-pay | Admitting: Family Medicine

## 2015-10-29 ENCOUNTER — Ambulatory Visit
Admission: RE | Admit: 2015-10-29 | Discharge: 2015-10-29 | Disposition: A | Discharge status: Home / Self Care | Payer: Medicare Other | Source: Ambulatory Visit | Attending: Nurse Practitioner | Admitting: Nurse Practitioner

## 2015-10-29 DIAGNOSIS — K743 Primary biliary cirrhosis: Secondary | ICD-10-CM | POA: Diagnosis not present

## 2015-12-01 ENCOUNTER — Other Ambulatory Visit: Payer: Self-pay | Admitting: Family Medicine

## 2015-12-05 ENCOUNTER — Other Ambulatory Visit: Payer: Self-pay | Admitting: *Deleted

## 2015-12-05 MED ORDER — HYDROXYUREA 500 MG PO CAPS: ORAL_CAPSULE | ORAL | Status: DC

## 2016-01-30 DIAGNOSIS — E032 Hypothyroidism due to medicaments and other exogenous substances: Secondary | ICD-10-CM | POA: Insufficient documentation

## 2016-02-11 ENCOUNTER — Other Ambulatory Visit: Payer: Self-pay | Admitting: Internal Medicine

## 2016-02-11 NOTE — Telephone Encounter (Signed)
Spoke with Dr. [name]. MD agreed to RF the hydrea. Chart reviewed. Medication sent via escribe.

## 2016-03-16 ENCOUNTER — Other Ambulatory Visit: Payer: Self-pay | Admitting: Internal Medicine

## 2016-03-18 ENCOUNTER — Other Ambulatory Visit: Payer: Self-pay | Admitting: *Deleted

## 2016-03-18 DIAGNOSIS — C50911 Malignant neoplasm of unspecified site of right female breast: Secondary | ICD-10-CM

## 2016-03-18 MED ORDER — ANASTROZOLE 1 MG PO TABS: 1.0000 mg | ORAL_TABLET | Freq: Every day | ORAL | Status: DC

## 2016-03-18 NOTE — Progress Notes (Signed)
rcvd incoming fax from [facility] in [place]. Pt needs RF on Anastrazole 1 mg 1 tablet daily. RX renewal electronically sent to patient's pharmacy.

## 2016-03-22 ENCOUNTER — Inpatient Hospital Stay
Admission: EM | Admit: 2016-03-22 | Discharge: 2016-03-24 | DRG: 191 | Disposition: A | Discharge status: Home / Self Care | Payer: Medicare Other | Attending: Internal Medicine | Admitting: Internal Medicine

## 2016-03-22 ENCOUNTER — Emergency Department: Payer: Medicare Other

## 2016-03-22 ENCOUNTER — Encounter: Payer: Self-pay | Admitting: Emergency Medicine

## 2016-03-22 DIAGNOSIS — Z7983 Long term (current) use of bisphosphonates: Secondary | ICD-10-CM | POA: Diagnosis not present

## 2016-03-22 DIAGNOSIS — R0602 Shortness of breath: Secondary | ICD-10-CM | POA: Diagnosis not present

## 2016-03-22 DIAGNOSIS — Z803 Family history of malignant neoplasm of breast: Secondary | ICD-10-CM | POA: Diagnosis not present

## 2016-03-22 DIAGNOSIS — R739 Hyperglycemia, unspecified: Secondary | ICD-10-CM | POA: Diagnosis present

## 2016-03-22 DIAGNOSIS — Z7952 Long term (current) use of systemic steroids: Secondary | ICD-10-CM | POA: Diagnosis not present

## 2016-03-22 DIAGNOSIS — Z7982 Long term (current) use of aspirin: Secondary | ICD-10-CM | POA: Diagnosis not present

## 2016-03-22 DIAGNOSIS — J440 Chronic obstructive pulmonary disease with acute lower respiratory infection: Secondary | ICD-10-CM | POA: Diagnosis present

## 2016-03-22 DIAGNOSIS — I482 Chronic atrial fibrillation: Secondary | ICD-10-CM | POA: Diagnosis present

## 2016-03-22 DIAGNOSIS — I4891 Unspecified atrial fibrillation: Secondary | ICD-10-CM

## 2016-03-22 DIAGNOSIS — Z87891 Personal history of nicotine dependence: Secondary | ICD-10-CM | POA: Diagnosis not present

## 2016-03-22 DIAGNOSIS — I129 Hypertensive chronic kidney disease with stage 1 through stage 4 chronic kidney disease, or unspecified chronic kidney disease: Secondary | ICD-10-CM | POA: Diagnosis present

## 2016-03-22 DIAGNOSIS — Z79899 Other long term (current) drug therapy: Secondary | ICD-10-CM | POA: Diagnosis not present

## 2016-03-22 DIAGNOSIS — Z7901 Long term (current) use of anticoagulants: Secondary | ICD-10-CM

## 2016-03-22 DIAGNOSIS — Z853 Personal history of malignant neoplasm of breast: Secondary | ICD-10-CM

## 2016-03-22 DIAGNOSIS — J441 Chronic obstructive pulmonary disease with (acute) exacerbation: Secondary | ICD-10-CM | POA: Diagnosis present

## 2016-03-22 DIAGNOSIS — Z79818 Long term (current) use of other agents affecting estrogen receptors and estrogen levels: Secondary | ICD-10-CM | POA: Diagnosis not present

## 2016-03-22 DIAGNOSIS — N189 Chronic kidney disease, unspecified: Secondary | ICD-10-CM

## 2016-03-22 DIAGNOSIS — Z923 Personal history of irradiation: Secondary | ICD-10-CM | POA: Diagnosis not present

## 2016-03-22 DIAGNOSIS — I4892 Unspecified atrial flutter: Secondary | ICD-10-CM | POA: Diagnosis present

## 2016-03-22 DIAGNOSIS — D509 Iron deficiency anemia, unspecified: Secondary | ICD-10-CM | POA: Diagnosis present

## 2016-03-22 DIAGNOSIS — E785 Hyperlipidemia, unspecified: Secondary | ICD-10-CM | POA: Diagnosis present

## 2016-03-22 DIAGNOSIS — N181 Chronic kidney disease, stage 1: Secondary | ICD-10-CM | POA: Diagnosis present

## 2016-03-22 DIAGNOSIS — J40 Bronchitis, not specified as acute or chronic: Secondary | ICD-10-CM

## 2016-03-22 DIAGNOSIS — J209 Acute bronchitis, unspecified: Secondary | ICD-10-CM | POA: Diagnosis present

## 2016-03-22 HISTORY — DX: Chronic obstructive pulmonary disease, unspecified: J44.9

## 2016-03-22 LAB — BASIC METABOLIC PANEL
ANION GAP: 11 (ref 5–15)
BUN: 12 mg/dL (ref 6–20)
CALCIUM: 9.6 mg/dL (ref 8.9–10.3)
CO2: 27 mmol/L (ref 22–32)
Chloride: 98 mmol/L — ABNORMAL LOW (ref 101–111)
Creatinine, Ser: 1.06 mg/dL — ABNORMAL HIGH (ref 0.44–1.00)
GFR calc Af Amer: >60 mL/min (ref >60)
GFR, EST NON AFRICAN AMERICAN: 52 mL/min — AB (ref >60)
GLUCOSE: 127 mg/dL — AB (ref 65–99)
POTASSIUM: 3.9 mmol/L (ref 3.5–5.1)
SODIUM: 136 mmol/L (ref 135–145)

## 2016-03-22 LAB — TROPONIN I
Troponin I: <0.03 ng/mL (ref <0.03)
Troponin I: <0.03 ng/mL (ref <0.03)

## 2016-03-22 LAB — CBC WITH DIFFERENTIAL/PLATELET
BASOS PCT: 1 %
Basophils Absolute: 0.1 10*3/uL (ref 0–0.1)
EOS ABS: 0.1 10*3/uL (ref 0–0.7)
Eosinophils Relative: 1 %
HCT: 40.9 % (ref 35.0–47.0)
Hemoglobin: 13.9 g/dL (ref 12.0–16.0)
LYMPHS ABS: 0.5 10*3/uL — AB (ref 1.0–3.6)
Lymphocytes Relative: 6 %
MCH: 34.5 pg — AB (ref 26.0–34.0)
MCHC: 34 g/dL (ref 32.0–36.0)
MCV: 101.5 fL — ABNORMAL HIGH (ref 80.0–100.0)
MONO ABS: 0.8 10*3/uL (ref 0.2–0.9)
MONOS PCT: 9 %
Neutro Abs: 7.2 10*3/uL — ABNORMAL HIGH (ref 1.4–6.5)
Neutrophils Relative %: 83 %
PLATELETS: 251 10*3/uL (ref 150–440)
RBC: 4.03 MIL/uL (ref 3.80–5.20)
RDW: 16 % — ABNORMAL HIGH (ref 11.5–14.5)
WBC: 8.7 10*3/uL (ref 3.6–11.0)

## 2016-03-22 LAB — TSH: TSH: 4.384 u[IU]/mL (ref 0.350–4.500)

## 2016-03-22 LAB — PROTIME-INR
INR: 3.68
PROTHROMBIN TIME: 35.7 s — AB (ref 11.4–15.0)

## 2016-03-22 LAB — MRSA PCR SCREENING: MRSA by PCR: NEGATIVE

## 2016-03-22 LAB — BRAIN NATRIURETIC PEPTIDE: B NATRIURETIC PEPTIDE 5: 580 pg/mL — AB (ref 0.0–100.0)

## 2016-03-22 LAB — HEMOGLOBIN A1C: HEMOGLOBIN A1C: 6.1 % — AB (ref 4.0–6.0)

## 2016-03-22 MED ORDER — MOMETASONE FURO-FORMOTEROL FUM 100-5 MCG/ACT IN AERO
2.0000 | INHALATION_SPRAY | Freq: Two times a day (BID) | RESPIRATORY_TRACT | Status: DC
  Administered 2016-03-22 – 2016-03-24 (×5): 2 via RESPIRATORY_TRACT
  Filled 2016-03-22: qty 8.8

## 2016-03-22 MED ORDER — SODIUM CHLORIDE 0.9 % IV SOLN: 250.0000 mL | INTRAVENOUS | Status: DC | PRN

## 2016-03-22 MED ORDER — TIOTROPIUM BROMIDE MONOHYDRATE 18 MCG IN CAPS
18.0000 ug | ORAL_CAPSULE | Freq: Every day | RESPIRATORY_TRACT | Status: DC
  Administered 2016-03-22 – 2016-03-24 (×3): 18 ug via RESPIRATORY_TRACT
  Filled 2016-03-22: qty 5

## 2016-03-22 MED ORDER — ACETAMINOPHEN 650 MG RE SUPP: 650.0000 mg | Freq: Four times a day (QID) | RECTAL | Status: DC | PRN

## 2016-03-22 MED ORDER — METHYLPREDNISOLONE SODIUM SUCC 125 MG IJ SOLR
60.0000 mg | INTRAMUSCULAR | Status: DC
  Administered 2016-03-22 – 2016-03-23 (×2): 60 mg via INTRAVENOUS
  Filled 2016-03-22 (×2): qty 2

## 2016-03-22 MED ORDER — DILTIAZEM HCL 100 MG IV SOLR
5.0000 mg/h | Freq: Once | INTRAVENOUS | Status: AC
  Administered 2016-03-22: 5 mg/h via INTRAVENOUS
  Filled 2016-03-22: qty 100

## 2016-03-22 MED ORDER — ONDANSETRON HCL 4 MG/2ML IJ SOLN: 4.0000 mg | Freq: Four times a day (QID) | INTRAMUSCULAR | Status: DC | PRN

## 2016-03-22 MED ORDER — HYDROXYUREA 500 MG PO CAPS: 500.0000 mg | ORAL_CAPSULE | Freq: Every day | ORAL | Status: DC

## 2016-03-22 MED ORDER — DOXYCYCLINE HYCLATE 100 MG PO TABS
100.0000 mg | ORAL_TABLET | Freq: Once | ORAL | Status: AC
  Administered 2016-03-22: 100 mg via ORAL
  Filled 2016-03-22: qty 1

## 2016-03-22 MED ORDER — ACETAMINOPHEN 325 MG PO TABS: 650.0000 mg | ORAL_TABLET | Freq: Four times a day (QID) | ORAL | Status: DC | PRN

## 2016-03-22 MED ORDER — LEVOTHYROXINE SODIUM 50 MCG PO TABS
50.0000 ug | ORAL_TABLET | Freq: Every day | ORAL | Status: DC
  Administered 2016-03-23 – 2016-03-24 (×2): 50 ug via ORAL
  Filled 2016-03-22: qty 2
  Filled 2016-03-22: qty 1

## 2016-03-22 MED ORDER — SODIUM CHLORIDE 0.9% FLUSH
3.0000 mL | Freq: Two times a day (BID) | INTRAVENOUS | Status: DC
  Administered 2016-03-23: 3 mL via INTRAVENOUS

## 2016-03-22 MED ORDER — LISINOPRIL 5 MG PO TABS
5.0000 mg | ORAL_TABLET | Freq: Every day | ORAL | Status: DC
  Administered 2016-03-22 – 2016-03-24 (×3): 5 mg via ORAL
  Filled 2016-03-22 (×3): qty 1

## 2016-03-22 MED ORDER — LEVOFLOXACIN 500 MG PO TABS
500.0000 mg | ORAL_TABLET | Freq: Every day | ORAL | Status: DC
  Administered 2016-03-22 – 2016-03-23 (×2): 500 mg via ORAL
  Filled 2016-03-22 (×2): qty 1

## 2016-03-22 MED ORDER — SODIUM CHLORIDE 0.9% FLUSH: 3.0000 mL | INTRAVENOUS | Status: DC | PRN

## 2016-03-22 MED ORDER — METOPROLOL SUCCINATE ER 50 MG PO TB24
50.0000 mg | ORAL_TABLET | Freq: Every day | ORAL | Status: DC
  Administered 2016-03-22: 50 mg via ORAL
  Filled 2016-03-22 (×2): qty 1

## 2016-03-22 MED ORDER — DILTIAZEM HCL 25 MG/5ML IV SOLN
10.0000 mg | Freq: Once | INTRAVENOUS | Status: AC
  Administered 2016-03-22: 10 mg via INTRAVENOUS
  Filled 2016-03-22: qty 5

## 2016-03-22 MED ORDER — ASPIRIN EC 81 MG PO TBEC
81.0000 mg | DELAYED_RELEASE_TABLET | Freq: Every day | ORAL | Status: DC
  Administered 2016-03-22 – 2016-03-24 (×3): 81 mg via ORAL
  Filled 2016-03-22 (×3): qty 1

## 2016-03-22 MED ORDER — GUAIFENESIN-DM 100-10 MG/5ML PO SYRP
10.0000 mL | ORAL_SOLUTION | Freq: Four times a day (QID) | ORAL | Status: DC | PRN
  Administered 2016-03-22 – 2016-03-23 (×2): 10 mL via ORAL
  Filled 2016-03-22 (×2): qty 10

## 2016-03-22 MED ORDER — ALENDRONATE SODIUM 70 MG PO TABS: 70.0000 mg | ORAL_TABLET | ORAL | Status: DC

## 2016-03-22 MED ORDER — LEVALBUTEROL HCL 1.25 MG/3ML IN NEBU
1.2500 mg | INHALATION_SOLUTION | Freq: Once | RESPIRATORY_TRACT | Status: AC
  Administered 2016-03-22: 1.25 mg via RESPIRATORY_TRACT
  Filled 2016-03-22: qty 0.5
  Filled 2016-03-22: qty 3

## 2016-03-22 MED ORDER — ONDANSETRON HCL 4 MG PO TABS
4.0000 mg | ORAL_TABLET | Freq: Four times a day (QID) | ORAL | Status: DC | PRN
  Filled 2016-03-22: qty 1

## 2016-03-22 MED ORDER — FERROUS SULFATE 220 (44 FE) MG/5ML PO ELIX
134.0000 mg | ORAL_SOLUTION | Freq: Two times a day (BID) | ORAL | Status: DC
Start: 2016-03-22 — End: 2016-03-24
  Administered 2016-03-22 – 2016-03-24 (×5): 134 mg via ORAL
  Filled 2016-03-22 (×6): qty 3.1

## 2016-03-22 MED ORDER — GUAIFENESIN ER 600 MG PO TB12
600.0000 mg | ORAL_TABLET | Freq: Two times a day (BID) | ORAL | Status: DC
  Administered 2016-03-22 – 2016-03-24 (×5): 600 mg via ORAL
  Filled 2016-03-22 (×5): qty 1

## 2016-03-22 MED ORDER — SODIUM CHLORIDE 0.9% FLUSH
3.0000 mL | Freq: Two times a day (BID) | INTRAVENOUS | Status: DC
  Administered 2016-03-22 – 2016-03-24 (×2): 3 mL via INTRAVENOUS

## 2016-03-22 MED ORDER — DILTIAZEM HCL 100 MG IV SOLR
5.0000 mg/h | INTRAVENOUS | Status: DC
  Administered 2016-03-22: 10 mg/h via INTRAVENOUS
  Administered 2016-03-22: 5 mg/h via INTRAVENOUS
  Filled 2016-03-22: qty 100

## 2016-03-22 MED ORDER — ALPRAZOLAM 0.25 MG PO TABS: 0.5000 mg | ORAL_TABLET | Freq: Two times a day (BID) | ORAL | Status: DC | PRN

## 2016-03-22 MED ORDER — URSODIOL 300 MG PO CAPS
300.0000 mg | ORAL_CAPSULE | Freq: Four times a day (QID) | ORAL | Status: DC
  Administered 2016-03-22 – 2016-03-24 (×6): 300 mg via ORAL
  Filled 2016-03-22 (×11): qty 1

## 2016-03-22 MED ORDER — METOPROLOL SUCCINATE ER 50 MG PO TB24
50.0000 mg | ORAL_TABLET | Freq: Two times a day (BID) | ORAL | Status: DC
  Administered 2016-03-22 – 2016-03-24 (×4): 50 mg via ORAL
  Filled 2016-03-22 (×4): qty 1

## 2016-03-22 MED ORDER — METHYLPREDNISOLONE SODIUM SUCC 125 MG IJ SOLR
125.0000 mg | Freq: Once | INTRAMUSCULAR | Status: AC
Start: 2016-03-22 — End: 2016-03-22
  Administered 2016-03-22: 125 mg via INTRAVENOUS
  Filled 2016-03-22: qty 2

## 2016-03-22 MED ORDER — DILTIAZEM HCL ER COATED BEADS 120 MG PO CP24
120.0000 mg | ORAL_CAPSULE | Freq: Two times a day (BID) | ORAL | Status: DC
  Administered 2016-03-22 – 2016-03-23 (×4): 120 mg via ORAL
  Filled 2016-03-22 (×4): qty 1

## 2016-03-22 MED ORDER — WARFARIN - PHARMACIST DOSING INPATIENT
Freq: Every day | Status: DC
  Administered 2016-03-22: 18:00:00
  Filled 2016-03-22 (×4): qty 1

## 2016-03-22 MED ORDER — ANASTROZOLE 1 MG PO TABS
1.0000 mg | ORAL_TABLET | Freq: Every day | ORAL | Status: DC
  Administered 2016-03-22 – 2016-03-24 (×3): 1 mg via ORAL
  Filled 2016-03-22 (×3): qty 1

## 2016-03-22 MED ORDER — HYDROXYUREA 500 MG PO CAPS
500.0000 mg | ORAL_CAPSULE | ORAL | Status: DC
  Administered 2016-03-23 – 2016-03-24 (×2): 500 mg via ORAL
  Filled 2016-03-22 (×2): qty 1

## 2016-03-22 MED ORDER — ALBUTEROL SULFATE (2.5 MG/3ML) 0.083% IN NEBU
2.5000 mg | INHALATION_SOLUTION | RESPIRATORY_TRACT | Status: DC
  Administered 2016-03-22 – 2016-03-23 (×5): 2.5 mg via RESPIRATORY_TRACT
  Filled 2016-03-22 (×5): qty 3

## 2016-03-22 MED ORDER — PRAVASTATIN SODIUM 20 MG PO TABS
20.0000 mg | ORAL_TABLET | Freq: Every day | ORAL | Status: DC
  Administered 2016-03-22 – 2016-03-23 (×2): 20 mg via ORAL
  Filled 2016-03-22 (×3): qty 1

## 2016-03-22 MED ORDER — AMIODARONE HCL 200 MG PO TABS
100.0000 mg | ORAL_TABLET | Freq: Every day | ORAL | Status: DC
  Administered 2016-03-22 – 2016-03-24 (×3): 100 mg via ORAL
  Filled 2016-03-22: qty 2
  Filled 2016-03-22 (×2): qty 1

## 2016-03-22 NOTE — H&P (Addendum)
Adriana Adriana Martin Adriana Martin at [place] NAME: Adriana Adriana Martin Adriana Martin    MR#:  [id number]  DATE OF BIRTH:  [birth year]  DATE OF ADMISSION:  03/22/2016  PRIMARY CARE PHYSICIAN: Adriana Adriana Martin Kayser, MD   REQUESTING/REFERRING PHYSICIAN:   CHIEF COMPLAINT:   Chief Complaint  Patient presents with  . Cough    HISTORY OF PRESENT ILLNESS: Adriana Adriana Martin Adriana Martin  is a 70 y.o. female with a known history of atrial Adriana Martin, Adriana Adriana Martin Adriana Martin, Adriana Adriana Martin Adriana Martin, Adriana Adriana Martin Adriana Martin, Adriana Adriana Martin Adriana Martin, Adriana Adriana Martin Adriana Martin, who presents to the hospital with complaints of shortness of breath, cough, green phlegm production, weakness, feeling no energy. Under gravity emergency room, her chest x-ray was unremarkable, EKG showed A. fib, RVR with heart rate of 140. Patient denies any chest pains. Patient received 2 doses of Cardizem intravenously, her heart rate improved to 140-120, IV Cardizem drip was initiated and hospitalist services were contacted for admission.   PAST MEDICAL HISTORY:   Past Medical History  Diagnosis Date  . Hepatitis   . Atrial Adriana Martin (Adriana Adriana Martin Adriana Martin)   . Hyperlipemia   . Adriana Adriana Martin Adriana Martin   . Breast cancer (Adriana Martin)     Adriana Adriana Martin Adriana Martin c radiation 2012  . Iron (Fe) deficiency anemia   . Adriana (Adriana Adriana Martin Adriana Martin obstructive pulmonary disease) (Adriana Adriana Martin Adriana Martin)     PAST SURGICAL HISTORY: Past Surgical History  Procedure Laterality Date  . Eye surgery    . Cardiac surgery    . Mvp    . Mastectomy  01/2011  . Breast surgery    . Ablation    . Abdominal hysterectomy      Partial  . Electrophysiologic study N/A 01/23/2015    Procedure: CARDIOVERSION;  Surgeon: Adriana Skains, MD;  Location: [facility] ORS;  Service: Cardiovascular;  Laterality: N/A;  . Colonoscopy  2012  . Esophagogastroduodenoscopy  2012    SOCIAL HISTORY:  Social History  Substance Use Topics  . Smoking status: Former Smoker -- 0.25 packs/day    Types: Cigarettes    Quit date: 10/07/1998  . Adriana Adriana Martin Adriana Martin tobacco: Not Adriana Adriana Martin file  .  Alcohol Use: 2.4 oz/week    4 Cans of beer per week     Comment: socially    FAMILY HISTORY:  Family History  Problem Relation Age of Onset  . Cancer Mother   . Breast cancer Mother   . Cancer Father   . Cancer Brother   . Cancer Daughter   . Breast cancer Daughter     DRUG ALLERGIES:  Allergies  Allergen Reactions  . Atorvastatin     Other reaction(s): Unknown  . Calcium Carbonate     Other reaction(s): Unknown  . Fluoxetine     Other reaction(s): Unknown  . Hydrochlorothiazide     Other reaction(s): Unknown    Review of Systems  Constitutional: Negative for fever, chills, weight loss and malaise/fatigue.  HENT: Negative for congestion.   Eyes: Positive for blurred vision. Negative for double vision.  Respiratory: Positive for cough, sputum production, shortness of breath and wheezing.   Cardiovascular: Positive for orthopnea. Negative for chest pain, palpitations, leg swelling and PND.  Gastrointestinal: Negative for nausea, vomiting, abdominal pain, diarrhea, constipation, blood in stool and melena.  Genitourinary: Negative for dysuria, urgency, frequency and hematuria.  Musculoskeletal: Negative for falls.  Skin: Negative for rash.  Neurological: Positive for weakness. Negative for dizziness.  Psychiatric/Behavioral: Negative for depression and memory loss. The patient is not nervous/anxious.     MEDICATIONS AT HOME:  Prior to Admission medications   Medication  Sig Start Date End Date Taking? Authorizing Provider  ADVAIR DISKUS 100-50 MCG/DOSE AEPB Inhale 1 puff into the lungs 2 (two) times daily.  10/29/14  Yes Historical Provider, MD  alendronate (FOSAMAX) 70 MG tablet Take 70 mg by mouth once a week. Take with a full glass of water Adriana Adriana Martin an empty stomach. *Take Adriana Adriana Martin Sunday*   Yes Historical Provider, MD  ALPRAZolam Adriana Adriana Martin Adriana Martin) 0.5 MG tablet Take 0.5 mg by mouth 2 (two) times daily as needed for anxiety.   Yes Historical Provider, MD  amiodarone (PACERONE) 200 MG tablet  Take 100 mg by mouth daily.   Yes Historical Provider, MD  anastrozole (ARIMIDEX) 1 MG tablet Take 1 tablet (1 mg total) by mouth daily. 03/18/16  Yes Cammie Sickle, MD  aspirin EC 81 MG tablet Take 81 mg by mouth daily.   Yes Historical Provider, MD  Ferrous Sulfate 134 MG TABS Take 1 tablet by mouth 2 (two) times daily.   Yes Historical Provider, MD  hydroxyurea (HYDREA) 500 MG capsule TAKE 1 CAPSULE BY MOUTH 6 TIMES A WEEK 03/16/16  Yes Cammie Sickle, MD  levothyroxine (SYNTHROID, LEVOTHROID) 50 MCG tablet Take 50 mcg by mouth daily before breakfast.   Yes Historical Provider, MD  lisinopril (PRINIVIL,ZESTRIL) 5 MG tablet Take 5 mg by mouth daily.  10/07/15  Yes Historical Provider, MD  metoprolol succinate (TOPROL-XL) 50 MG 24 hr tablet Take 50 mg by mouth 2 (two) times daily. Take with or immediately following a meal.   Yes Historical Provider, MD  Multiple Vitamins-Minerals (CENTRUM SILVER ULTRA WOMENS) TABS Take 1 tablet by mouth daily.   Yes Historical Provider, MD  pravastatin (PRAVACHOL) 20 MG tablet Take 20 mg by mouth at bedtime.   Yes Historical Provider, MD  predniSONE (DELTASONE) 5 MG tablet Take 5 mg by mouth daily with breakfast.   Yes Historical Provider, MD  PROAIR HFA 108 (90 BASE) MCG/ACT inhaler 2 puffs every 4 (four) hours as needed for wheezing or shortness of breath.  10/29/14  Yes Historical Provider, MD  Probiotic Product ([place]) CAPS Take 1 capsule by mouth daily.   Yes Historical Provider, MD  ursodiol (ACTIGALL) 300 MG capsule Take 300 mg by mouth 4 (four) times daily.    Yes Historical Provider, MD  Vitamin D, Ergocalciferol, (DRISDOL) 50000 units CAPS capsule Take 50,000 Units by mouth every 30 (thirty) days.  07/31/15  Yes Historical Provider, MD  warfarin (Adriana Martin) 2 MG tablet Take 2-4 mg by mouth See admin instructions. Take 1 tablet by mouth every other day, alternating with 2 tablets (4 mg) by mouth every other day. 04/01/15  Yes Historical  Provider, MD  amiodarone (PACERONE) 400 MG tablet Take 1 tablet (400 mg total) by mouth daily. 05/04/15   Adriana Jewett, MD      PHYSICAL EXAMINATION:   VITAL SIGNS: Blood pressure 127/86, pulse 139, temperature 97.5 F (36.4 C), temperature source Oral, resp. rate 27, height 5\' 2"  (1.575 m), weight 86.183 kg (190 lb), SpO2 97 %.  GENERAL:  70 y.o.-year-old patient lying in the bed In mild respiratory distress, especially with movements or speech.  EYES: Pupils equal, round, reactive to light and accommodation. No scleral icterus. Extraocular muscles intact.  HEENT: Head atraumatic, normocephalic. Oropharynx and nasopharynx clear.  Flushed and face NECK:  Supple, no jugular venous distention. No thyroid enlargement, no tenderness.  LUNGS:  Diminishedbreath sounds bilaterally,  Scattered wheezing,  few scattered rales,rhonchi , no crepitations.  Intermittent use of accessory  muscles of respiration, especially Adriana Adriana Martin exertion, prolonged speech .  CARDIOVASCULAR: S1, S2 , irregularly irregular, tachycardic No murmurs, rubs, or gallops.  ABDOMEN: Soft, nontender, nondistended. Bowel sounds present. No organomegaly or mass.  EXTREMITIES: No pedal edema, cyanosis, or clubbing.  NEUROLOGIC: Cranial nerves II through XII are intact. Muscle strength 5/5 in all extremities. Sensation intact. Gait not checked.  PSYCHIATRIC: The patient is alert and oriented x 3.  SKIN: No obvious rash, lesion, or ulcer.   LABORATORY PANEL:   CBC  Recent Labs Lab 03/22/16 0838  WBC 8.7  HGB 13.9  HCT 40.9  PLT 251  MCV 101.5*  MCH 34.5*  MCHC 34.0  RDW 16.0*  LYMPHSABS 0.5*  MONOABS 0.8  EOSABS 0.1  BASOSABS 0.1   ------------------------------------------------------------------------------------------------------------------  Chemistries   Recent Labs Lab 03/22/16 0838  NA 136  K 3.9  CL 98*  CO2 27  GLUCOSE 127*  BUN 12  CREATININE 1.06*  CALCIUM 9.6    ------------------------------------------------------------------------------------------------------------------  Cardiac Enzymes  Recent Labs Lab 03/22/16 0838  TROPONINI <0.03   ------------------------------------------------------------------------------------------------------------------  RADIOLOGY: Dg Chest Port 1 View  03/22/2016  CLINICAL DATA:  Productive cough for 2 days EXAM: PORTABLE CHEST 1 VIEW COMPARISON:  05/02/2015 FINDINGS: Cardiac shadow remains enlarged. Postsurgical changes are again seen. The lungs are well aerated bilaterally. Calcified granuloma is again noted Adriana Adriana Martin the right and stable. No new focal infiltrate is seen. No bony abnormality is noted. IMPRESSION: No active disease. Electronically Signed   By: Inez Catalina M.D.   Adriana Adriana Martin: 03/22/2016 07:57    EKG: Orders placed or performed during the hospital encounter of 03/22/16  . ED EKG  . ED EKG  . EKG 12-Lead  . EKG 12-Lead  EKG in the emergency room revealed a flutter with predominantly 2-1 AV block, rate of 140, normal axis representation abnormalities. No acute ST-T changes.  IMPRESSION AND PLAN:  Active Problems:   Adriana exacerbation (HCC)   Acute bronchitis   Atrial Adriana Martin with RVR (HCC)   Adriana Adriana Martin Adriana Martin renal insufficiency #1. Adriana exacerbation, admitted to a medical floor, initiate her Adriana Adriana Martin steroids, inhalation therapy, antibiotics   #2. Acute bronchitis, levofloxacin, sputum cultures, follow culture results and adjust antibiotics #3. A. fib, RVR, continue patient Adriana Adriana Martin Cardizem IV drip, continue Adriana Martin, pro time is 35.7, INR 3.68, a little supratherapeutic, holding Adriana Martin today #4. Hyperglycemia, get hemoglobin A1c to rule out diabetes   All the records are reviewed and case discussed with ED provider. Management plans discussed with the patient, family and they are in agreement.  CODE STATUS: Code Status History    Date Active Date Inactive Code Status Order ID Comments User Context    05/02/2015  2:43 PM 05/03/2015  8:03 PM Full Code [id number]  Vaughan Basta, MD Inpatient       TOTAL TIME TAKING CARE OF THIS PATIENT: 50 minutes.    Theodoro Grist M.D Adriana Adriana Martin 03/22/2016 at 10:44 AM  Between 7am to 6pm - Pager - [phone] After 6pm go to [email or web address] - password [email or web address] [facility]  Office  [phone]  CC: Primary care physician; Adriana Adriana Martin Kayser, MD

## 2016-03-22 NOTE — Progress Notes (Signed)
Pt to unit approximately 1200 oriented to unit and room.  daughter at bedside.  cardizem drip at 10.  HR in 80s & 90s, BP stable.  PO Cardizem long acting started.  Pt A&O x 4 and calm, cooperative.

## 2016-03-22 NOTE — Progress Notes (Signed)
Per MD Ether Griffins reinstate cards drip and titrate to 10/hr, give PO meds also

## 2016-03-22 NOTE — ED Notes (Signed)
C/O productive cough for 2 week.  Symptoms worsening.

## 2016-03-22 NOTE — Progress Notes (Signed)
Per MD Sparks order Robitussin DM for dry cough

## 2016-03-22 NOTE — ED Notes (Signed)
RN called pharmacy to verify levalbuterol order. Barcode would not scan so dose verified and diluted with 21ml of NS per instruction form Claiborne Billings in pharmacy.

## 2016-03-22 NOTE — ED Provider Notes (Signed)
[facility] Emergency Department Provider Note   ____________________________________________  Time seen: Approximately 7:30 AM I have reviewed the triage vital signs and the triage nursing note.  HISTORY  Chief Complaint Cough   Historian Patient and daughter  HPI Adriana Martin is a 70 y.o. female with a history of COPD, A. fib/flutter on aspirin and Coumadin, and a history of breast cancer 5 years ago, presents here for shortness of breath and trouble breathing worsening over the period about 2 weeks. She's had some cough with mild sputum production. No fever. Cough seems to be getting worse. No chest pain. Some generalized weakness. No nausea or abdominal pain. Denies palpitations.  Is moderate. Last visit with pulmonologist, Dr. Raul Del was about a month ago, when she was not having symptoms.    Past Medical History  Diagnosis Date  . Hepatitis   . Atrial fibrillation ([place])   . Hyperlipemia   . Hypertension   . Breast cancer ([place])     RT Lumpectomy c radiation 2012  . Iron (Fe) deficiency anemia   . COPD (chronic obstructive pulmonary disease) [facility])     Patient Active Problem List   Diagnosis Date Noted  . Cough with expectoration 06/04/2015  . Breast cancer ([place]) 04/02/2015  . Thrombocytosis ([place]) 02/01/2015  . Anemia 02/01/2015  . Atrial flutter ([place]) 01/23/2015  . Atrial fibrillation ([place]) 01/23/2015  . Other long term (current) drug therapy 11/06/2014  . Chronic diastolic heart failure ([place]) 08/01/2014  . Long term current use of anticoagulant 06/06/2014  . Anxiety 05/13/2014  . Chronic obstructive pulmonary disease ([place]) 03/22/2014  . 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 Avitaminosis D 02/19/2014    Past Surgical History  Procedure Laterality Date  . Eye surgery    . Cardiac surgery    . Mvp    . Mastectomy  01/2011  . Breast surgery    . Ablation    . Abdominal hysterectomy      Partial  . Electrophysiologic  study N/A 01/23/2015    Procedure: CARDIOVERSION;  Surgeon: Corey Skains, MD;  Location: [facility] ORS;  Service: Cardiovascular;  Laterality: N/A;  . Colonoscopy  2012  . Esophagogastroduodenoscopy  2012    Current Outpatient Rx  Name  Route  Sig  Dispense  Refill  . ADVAIR DISKUS 100-50 MCG/DOSE AEPB   Inhalation   Inhale 1 puff into the lungs 2 (two) times daily.       0     Dispense as written.   Marland Kitchen alendronate (FOSAMAX) 70 MG tablet   Oral   Take 70 mg by mouth once a week. Take with a full glass of water on an empty stomach. *Take on Sunday*         . ALPRAZolam (XANAX) 0.5 MG tablet   Oral   Take 0.5 mg by mouth 2 (two) times daily as needed for anxiety.         Marland Kitchen amiodarone (PACERONE) 200 MG tablet   Oral   Take 100 mg by mouth daily.         Marland Kitchen anastrozole (ARIMIDEX) 1 MG tablet   Oral   Take 1 tablet (1 mg total) by mouth daily.   30 tablet   6   . aspirin EC 81 MG tablet   Oral   Take 81 mg by mouth daily.         . Ferrous Sulfate 134 MG TABS   Oral   Take 1 tablet by  mouth 2 (two) times daily.         . hydroxyurea (HYDREA) 500 MG capsule      TAKE 1 CAPSULE BY MOUTH 6 TIMES A WEEK   30 capsule   0   . levothyroxine (SYNTHROID, LEVOTHROID) 50 MCG tablet   Oral   Take 50 mcg by mouth daily before breakfast.         . lisinopril (PRINIVIL,ZESTRIL) 5 MG tablet   Oral   Take 5 mg by mouth daily.       98   . metoprolol succinate (TOPROL-XL) 50 MG 24 hr tablet   Oral   Take 50 mg by mouth 2 (two) times daily. Take with or immediately following a meal.         . Multiple Vitamins-Minerals (CENTRUM SILVER ULTRA WOMENS) TABS   Oral   Take 1 tablet by mouth daily.         . pravastatin (PRAVACHOL) 20 MG tablet   Oral   Take 20 mg by mouth at bedtime.         . predniSONE (DELTASONE) 5 MG tablet   Oral   Take 5 mg by mouth daily with breakfast.         . PROAIR HFA 108 (90 BASE) MCG/ACT inhaler      2 puffs every 4 (four)  hours as needed for wheezing or shortness of breath.       0     Dispense as written.   . Probiotic Product ([place]) CAPS   Oral   Take 1 capsule by mouth daily.         . ursodiol (ACTIGALL) 300 MG capsule   Oral   Take 300 mg by mouth 4 (four) times daily.          . Vitamin D, Ergocalciferol, (DRISDOL) 50000 units CAPS capsule   Oral   Take 50,000 Units by mouth every 30 (thirty) days.          Marland Kitchen warfarin (COUMADIN) 2 MG tablet   Oral   Take 2-4 mg by mouth See admin instructions. Take 1 tablet by mouth every other day, alternating with 2 tablets (4 mg) by mouth every other day.      5   . amiodarone (PACERONE) 400 MG tablet   Oral   Take 1 tablet (400 mg total) by mouth daily.   30 tablet   0     Allergies Atorvastatin; Calcium carbonate; Fluoxetine; and Hydrochlorothiazide  Family History  Problem Relation Age of Onset  . Cancer Mother   . Breast cancer Mother   . Cancer Father   . Cancer Brother   . Cancer Daughter   . Breast cancer Daughter     Social History Social History  Substance Use Topics  . Smoking status: Former Smoker -- 0.25 packs/day    Types: Cigarettes    Quit date: 10/07/1998  . Smokeless tobacco: None  . Alcohol Use: 2.4 oz/week    4 Cans of beer per week     Comment: socially    Review of Systems  Constitutional: Negative for fever. Eyes: Negative for visual changes. ENT: Negative for sore throat. Cardiovascular: Negative for chest pain. Respiratory: Positive for shortness of breath. Gastrointestinal: Negative for abdominal pain, vomiting and diarrhea. Genitourinary: Negative for dysuria. Musculoskeletal: Negative for back pain. Skin: Negative for rash. Neurological: Negative for headache. 10 point Review of Systems otherwise negative ____________________________________________   PHYSICAL EXAM:  VITAL SIGNS: ED  Triage Vitals  Enc Vitals Group     BP 03/22/16 0720 145/84 mmHg     Pulse Rate  03/22/16 0720 68     Resp 03/22/16 0720 20     Temp 03/22/16 0720 97.5 F (36.4 C)     Temp Source 03/22/16 0720 Oral     SpO2 03/22/16 0720 98 %     Weight 03/22/16 0720 190 lb (86.183 kg)     Height 03/22/16 0720 5\' 2"  (1.575 m)     Head Cir --      Peak Flow --      Pain Score 03/22/16 0719 0     Pain Loc --      Pain Edu? --      Excl. in [place]? --      Constitutional: Alert and oriented. Well appearing and in no distress. HEENT   Head: Normocephalic and atraumatic.      Eyes: Conjunctivae are normal. PERRL. Normal extraocular movements.      Ears:         Nose: No congestion/rhinnorhea.   Mouth/Throat: Mucous membranes are moist.   Neck: No stridor. Cardiovascular/Chest: Tachycardic and regular rhythm.  No murmurs, rubs, or gallops. Respiratory: Mild decreased air movement throughout. Rhonchi and end expiratory wheeze right posterior greater than left posterior. No tachypnea or retractions. Gastrointestinal: Soft. No distention, no guarding, no rebound. Nontender.    Genitourinary/rectal:Deferred Musculoskeletal: Nontender with normal range of motion in all extremities. No joint effusions.  No lower extremity tenderness.  Trace lower extremity edema Neurologic:  Normal speech and language. No gross or focal neurologic deficits are appreciated. Skin:  Skin is warm, dry and intact. No rash noted. Psychiatric: Mood and affect are normal. Speech and behavior are normal. Patient exhibits appropriate insight and judgment.  ____________________________________________   EKG I, Lisa Roca, MD, the attending physician have personally viewed and interpreted all ECGs.  140 bpm. Narrow QRS. Tachycardic, Regular. Appears to be atrial flutter with 2-1 conduction. Normal axis. Nonspecific ST and T-wave ____________________________________________  LABS (pertinent positives/negatives)  Labs Reviewed  BASIC METABOLIC PANEL - Abnormal; Notable for the following:    Chloride  98 (*)    Glucose, Bld 127 (*)    Creatinine, Ser 1.06 (*)    GFR calc non Af Amer 52 (*)    All other components within normal limits  CBC WITH DIFFERENTIAL/PLATELET - Abnormal; Notable for the following:    MCV 101.5 (*)    MCH 34.5 (*)    RDW 16.0 (*)    Neutro Abs 7.2 (*)    Lymphs Abs 0.5 (*)    All other components within normal limits  PROTIME-INR - Abnormal; Notable for the following:    Prothrombin Time 35.7 (*)    All other components within normal limits  TROPONIN I  BRAIN NATRIURETIC PEPTIDE    ____________________________________________  RADIOLOGY All Xrays were viewed by me. Imaging interpreted by Radiologist.  Chest portable:  IMPRESSION: No active disease. __________________________________________  PROCEDURES  Procedure(s) performed: None  Critical Care performed: CRITICAL CARE Performed by: Lisa Roca   Total critical care time: 30 minutes  Critical care time was exclusive of separately billable procedures and treating other patients.  Critical care was necessary to treat or prevent imminent or life-threatening deterioration.  Critical care was time spent personally by me on the following activities: development of treatment plan with patient and/or surrogate as well as nursing, discussions with consultants, evaluation of patient's response to treatment, examination of patient,  obtaining history from patient or surrogate, ordering and performing treatments and interventions, ordering and review of laboratory studies, ordering and review of radiographic studies, pulse oximetry and re-evaluation of patient's condition.   ____________________________________________   ED COURSE / ASSESSMENT AND PLAN  Pertinent labs & imaging results that were available during my care of the patient were reviewed by me and considered in my medical decision making (see chart for details).   Patient came in for shortness of breath associated with a productive  cough worsening over. About 2 weeks and she does have with a history of bronchitis, COPD and CHF. Here she is found to be in atrial flutter with rapid ventricular response. Treating with IV bolus of diltiazem.  After bolus, hr 120s but back to 140s.  I will give a second bolus and place on drip.  She did miss her morning medication this morning.  I will give her her metoprolol home dose.  No pneumonia on cxr.  I will treat for clinical bronchitis/copd exacerbation with solumedrol and doxycycline.   Due to tachycardia, I will admit to hospitalist service.     CONSULTATIONS:   Hospitalist   Patient / Family / Caregiver informed of clinical course, medical decision-making process, and agree with plan.     ___________________________________________   FINAL CLINICAL IMPRESSION(S) / ED DIAGNOSES   Final diagnoses:  Atrial flutter with rapid ventricular response (HCC)  Shortness of breath  Bronchitis              Note: This dictation was prepared with Dragon dictation. Any transcriptional errors that result from this process are unintentional   Lisa Roca, MD 03/22/16 [phone]

## 2016-03-22 NOTE — Progress Notes (Signed)
ANTICOAGULATION CONSULT NOTE - Initial Consult  Pharmacy Consult for Warfarin  Indication: Hx of A. Fib and mechanical mitral valve.   Allergies  Allergen Reactions  . Atorvastatin     Other reaction(s): Unknown  . Calcium Carbonate     Other reaction(s): Unknown  . Fluoxetine     Other reaction(s): Unknown  . Hydrochlorothiazide     Other reaction(s): Unknown   Patient Measurements: Height: 5\' 2"  (157.5 cm) Weight: 190 lb (86.183 kg) IBW/kg (Calculated) : 50.1  Vital Signs: Temp: 97.5 F (36.4 C) (07/02 0720) Temp Source: Oral (07/02 0720) BP: 127/86 mmHg (07/02 1029) Pulse Rate: 139 (07/02 1029)  Labs:  Recent Labs  03/22/16 0838  HGB 13.9  HCT 40.9  PLT 251  LABPROT 35.7*  INR 3.68  CREATININE 1.06*  TROPONINI <0.03    Estimated Creatinine Clearance: 50.3 mL/min (by C-G formula based on Cr of 1.06).   Medical History: Past Medical History  Diagnosis Date  . Hepatitis   . Atrial fibrillation ([place])   . Hyperlipemia   . Hypertension   . Breast cancer ([place])     RT Lumpectomy c radiation 2012  . Iron (Fe) deficiency anemia   . COPD (chronic obstructive pulmonary disease) ([place])    Lab Results  Component Value Date   INR 3.68 03/22/2016   INR 2.89 05/02/2015   INR 4.79* 01/23/2015    Assessment: 70 yo female with PMH of A. Fib and mitral valve replacement. Pharmacy consulted for dosing and monitoring of warfarin. Patient was alternating warfarin 2mg   every other day with warfarin 4 mg every other day at home.   7/2 INR: 3.68   Goal of Therapy:  INR 2.5-3.5 Monitor platelets by anticoagulation protocol: Yes   Plan:  INR supratherapeutic today at 3.68. Patient has also been started on levofloxacin which can increase INR. Will hold dose today and check INR with AM labs.  Pharmacy will continue to follow per consult.    Nancy Fetter, PharmD Clinical Pharmacist 03/22/2016 11:24 AM

## 2016-03-22 NOTE — Progress Notes (Signed)
PHARMACIST - PHYSICIAN COMMUNICATION  CONCERNING: P&T Medication Policy Regarding Oral Bisphosphonates  RECOMMENDATION: Your order for alendronate (Fosamax), ibandronate (Boniva), or risedronate (Actonel) has been discontinued at this time.  If the patient's post-hospital medical condition warrants safe use of this class of drugs, please resume the pre-hospital regimen upon discharge.  DESCRIPTION:  Alendronate (Fosamax), ibandronate (Boniva), and risedronate (Actonel) can cause severe esophageal erosions in patients who are unable to remain upright at least 30 minutes after taking this medication.   Since brief interruptions in therapy are thought to have minimal impact on bone mineral density, the [place] has established that bisphosphonate orders should be routinely discontinued during hospitalization.   To override this safety policy and permit administration of Boniva, Fosamax, or Actonel in the hospital, prescribers must write "DO NOT HOLD" in the comments section when placing the order for this class of medications.  Nancy Fetter, PharmD Clinical Pharmacist 03/22/2016 12:51 PM

## 2016-03-23 LAB — BASIC METABOLIC PANEL
Anion gap: 10 (ref 5–15)
BUN: 15 mg/dL (ref 6–20)
CHLORIDE: 98 mmol/L — AB (ref 101–111)
CO2: 26 mmol/L (ref 22–32)
CREATININE: 1.04 mg/dL — AB (ref 0.44–1.00)
Calcium: 9 mg/dL (ref 8.9–10.3)
GFR calc Af Amer: >60 mL/min (ref >60)
GFR calc non Af Amer: 53 mL/min — ABNORMAL LOW (ref >60)
GLUCOSE: 158 mg/dL — AB (ref 65–99)
POTASSIUM: 4.2 mmol/L (ref 3.5–5.1)
SODIUM: 134 mmol/L — AB (ref 135–145)

## 2016-03-23 LAB — CBC
HEMATOCRIT: 34.8 % — AB (ref 35.0–47.0)
Hemoglobin: 11.7 g/dL — ABNORMAL LOW (ref 12.0–16.0)
MCH: 34.4 pg — ABNORMAL HIGH (ref 26.0–34.0)
MCHC: 33.5 g/dL (ref 32.0–36.0)
MCV: 102.7 fL — AB (ref 80.0–100.0)
PLATELETS: 197 10*3/uL (ref 150–440)
RBC: 3.39 MIL/uL — ABNORMAL LOW (ref 3.80–5.20)
RDW: 15.5 % — AB (ref 11.5–14.5)
WBC: 4.5 10*3/uL (ref 3.6–11.0)

## 2016-03-23 LAB — PROTIME-INR
INR: 3.32
Prothrombin Time: 33 seconds — ABNORMAL HIGH (ref 11.4–15.0)

## 2016-03-23 LAB — TROPONIN I

## 2016-03-23 MED ORDER — PREDNISONE 50 MG PO TABS
50.0000 mg | ORAL_TABLET | Freq: Every day | ORAL | Status: DC
  Administered 2016-03-24: 50 mg via ORAL
  Filled 2016-03-23: qty 1

## 2016-03-23 MED ORDER — WARFARIN SODIUM 1 MG PO TABS: 3.0000 mg | ORAL_TABLET | Freq: Every day | ORAL | Status: DC

## 2016-03-23 MED ORDER — WARFARIN SODIUM 1 MG PO TABS
2.0000 mg | ORAL_TABLET | Freq: Every day | ORAL | Status: DC
  Administered 2016-03-23: 2 mg via ORAL
  Filled 2016-03-23: qty 2

## 2016-03-23 MED ORDER — ALBUTEROL SULFATE (2.5 MG/3ML) 0.083% IN NEBU: 2.5000 mg | INHALATION_SOLUTION | RESPIRATORY_TRACT | Status: DC | PRN

## 2016-03-23 MED ORDER — AZITHROMYCIN 250 MG PO TABS
250.0000 mg | ORAL_TABLET | Freq: Every day | ORAL | Status: DC
  Administered 2016-03-24: 250 mg via ORAL
  Filled 2016-03-23: qty 1

## 2016-03-23 NOTE — Care Management Important Message (Signed)
Important Message  Patient Details  Name: Adriana Martin MRN: [id number] Date of Birth: 05/27/1946   Medicare Important Message Given:  Yes    Jolly Mango, RN 03/23/2016, 2:29 PM

## 2016-03-23 NOTE — Care Management (Signed)
Admitted to icu stepdown due to exac of copd and atrial fibrillation with RVR.  She required cardizem IV bolus without sx resolution so placed on continuous infusion.  Current 02 requirements are acute.  She use to have home 02 but her respiratory sx improved to the point she was able to "send it back."  If she need home 02, agency preference would be [facility].  Independent in all adls, denies issues accessing medical care, obtaining medications or with transportation.  no asistive ambulation devices.  Current with her PCP-Dr Dorthula Perfect at [facility].  Discussed with patient that she will be assessed for need of home 02 prior to discharge.

## 2016-03-23 NOTE — Progress Notes (Signed)
[place] at [place] NAME: Adriana Martin    MR#:  [id number]  DATE OF BIRTH:  12/24/1945  SUBJECTIVE:  Came in with increasing sob and found to be in afib. Now off cardizem gtt sats 96% on RA Pt requesting to go home  REVIEW OF SYSTEMS:   Review of Systems  Constitutional: Negative for fever, chills and weight loss.  HENT: Negative for ear discharge, ear pain and nosebleeds.   Eyes: Negative for blurred vision, pain and discharge.  Respiratory: Positive for shortness of breath. Negative for sputum production, wheezing and stridor.   Cardiovascular: Positive for palpitations. Negative for chest pain, orthopnea and PND.  Gastrointestinal: Negative for nausea, vomiting, abdominal pain and diarrhea.  Genitourinary: Negative for urgency and frequency.  Musculoskeletal: Negative for back pain and joint pain.  Neurological: Negative for sensory change, speech change, focal weakness and weakness.  Psychiatric/Behavioral: Negative for depression and hallucinations. The patient is not nervous/anxious.   All other systems reviewed and are negative.  Tolerating Diet:yes Tolerating PT: not needed  DRUG ALLERGIES:   Allergies  Allergen Reactions  . Atorvastatin     Other reaction(s): Unknown  . Calcium Carbonate     Other reaction(s): Unknown  . Fluoxetine     Other reaction(s): Unknown  . Hydrochlorothiazide     Other reaction(s): Unknown    VITALS:  Blood pressure 109/66, pulse 75, temperature 98.3 F (36.8 C), temperature source Oral, resp. rate 18, height 5\' 2"  (1.575 m), weight 86 kg (189 lb 9.5 oz), SpO2 97 %.  PHYSICAL EXAMINATION:   Physical Exam  GENERAL:  70 y.o.-year-old patient lying in the bed with no acute distress.  EYES: Pupils equal, round, reactive to light and accommodation. No scleral icterus. Extraocular muscles intact.  HEENT: Head atraumatic, normocephalic. Oropharynx and nasopharynx clear.  NECK:   Supple, no jugular venous distention. No thyroid enlargement, no tenderness.  LUNGS:distantbreath sounds bilaterally, no wheezing, rales, rhonchi. No use of accessory muscles of respiration.  CARDIOVASCULAR: S1, S2 normal. No murmurs, rubs, or gallops.  ABDOMEN: Soft, nontender, nondistended. Bowel sounds present. No organomegaly or mass.  EXTREMITIES: No cyanosis, clubbing or edema b/l.    NEUROLOGIC: Cranial nerves II through XII are intact. No focal Motor or sensory deficits b/l.   PSYCHIATRIC:  patient is alert and oriented x 3.  SKIN: No obvious rash, lesion, or ulcer.   LABORATORY PANEL:  CBC  Recent Labs Lab 03/23/16 0030  WBC 4.5  HGB 11.7*  HCT 34.8*  PLT 197    Chemistries   Recent Labs Lab 03/23/16 0030  NA 134*  K 4.2  CL 98*  CO2 26  GLUCOSE 158*  BUN 15  CREATININE 1.04*  CALCIUM 9.0   Cardiac Enzymes  Recent Labs Lab 03/23/16 0030  TROPONINI <0.03   RADIOLOGY:  Dg Chest Port 1 View  03/22/2016  CLINICAL DATA:  Productive cough for 2 days EXAM: PORTABLE CHEST 1 VIEW COMPARISON:  05/02/2015 FINDINGS: Cardiac shadow remains enlarged. Postsurgical changes are again seen. The lungs are well aerated bilaterally. Calcified granuloma is again noted on the right and stable. No new focal infiltrate is seen. No bony abnormality is noted. IMPRESSION: No active disease. Electronically Signed   By: Inez Catalina M.D.   On: 03/22/2016 07:57   ASSESSMENT AND PLAN:   Adriana Martin is a 70 y.o. female with a known history of atrial fibrillation, chronic, hyperlipidemia, hypertension, mechanical valve, on chronic anticoagulation with Coumadin, who  presents to the hospital with complaints of shortness of breath, cough, green phlegm production, weakness, feeling no energy  #1. COPD exacerbation - on steroids, inhalation therapy, antibiotics   #2. Acute bronchitis, levofloxacin, sputum cultures, follow culture results and adjust antibiotics  #3. A. fib, RVR, continue  patient was on Cardizem IV drip, continue Coumadin, pro time is 35.7, INR 3.68 -cont cardiac meds including CCB oral ,amiodarone and BB.  #4. Hyperglycemia -A1c 6.1 Will defer for PCP to follow sugars as oupt  Case discussed with Care Management/Social Worker. Management plans discussed with the patient, family and they are in agreement.  CODE STATUS: full  DVT Prophylaxis:coumadin TOTAL TIME TAKING CARE OF THIS PATIENT: 30 minutes.  >50% time spent on counselling and coordination of care  POSSIBLE D/C IN 1-2 DAYS, DEPENDING ON CLINICAL CONDITION.  Note: This dictation was prepared with Dragon dictation along with smaller phrase technology. Any transcriptional errors that result from this process are unintentional.  Adriana Martin M.D on 03/23/2016 at 6:57 PM  Between 7am to 6pm - Pager - [phone]  After 6pm go to [email or web address] - password [email or web address]  Office  [phone]  CC: Primary care physician; [name], MD

## 2016-03-23 NOTE — Progress Notes (Signed)
Patient alert and oriented. No complaints of pain. Room air low 90's. Tolerating diet and urinating controlled afib on monitor.

## 2016-03-23 NOTE — Progress Notes (Addendum)
ANTICOAGULATION CONSULT NOTE - Initial Consult  Pharmacy Consult for Warfarin  Indication: Hx of A. Fib and mechanical mitral valve.   Allergies  Allergen Reactions  . Atorvastatin     Other reaction(s): Unknown  . Calcium Carbonate     Other reaction(s): Unknown  . Fluoxetine     Other reaction(s): Unknown  . Hydrochlorothiazide     Other reaction(s): Unknown   Patient Measurements: Height: 5\' 2"  (157.5 cm) Weight: 189 lb 9.5 oz (86 kg) IBW/kg (Calculated) : 50.1  Vital Signs: Temp: 97.3 F (36.3 C) (07/03 0700) Temp Source: Oral (07/03 0100) BP: 134/92 mmHg (07/03 0900) Pulse Rate: 138 (07/03 0900)  Labs:  Recent Labs  03/22/16 0838 03/22/16 1328 03/22/16 1923 03/23/16 0030  HGB 13.9  --   --  11.7*  HCT 40.9  --   --  34.8*  PLT 251  --   --  197  LABPROT 35.7*  --   --  33.0*  INR 3.68  --   --  3.32  CREATININE 1.06*  --   --  1.04*  TROPONINI <0.03 <0.03 <0.03 <0.03    Estimated Creatinine Clearance: 51.3 mL/min (by C-G formula based on Cr of 1.04).   Medical History: Past Medical History  Diagnosis Date  . Hepatitis   . Atrial fibrillation ([place])   . Hyperlipemia   . Hypertension   . Breast cancer ([place])     RT Lumpectomy c radiation 2012  . Iron (Fe) deficiency anemia   . COPD (chronic obstructive pulmonary disease) ([place])    Lab Results  Component Value Date   INR 3.32 03/23/2016   INR 3.68 03/22/2016   INR 2.89 05/02/2015    Assessment: 70 yo female with PMH of A. Fib and mitral valve replacement. Pharmacy consulted for dosing and monitoring of warfarin. Patient was alternating warfarin 2mg   every other day with warfarin 4 mg every other day at home.   7/2 INR: 3.68 Coumadin: none 7/3 INR: 2.89  Goal of Therapy:  INR 2.5-3.5 Monitor platelets by anticoagulation protocol: Yes   Plan:  INR remains therapeutic after holding Coumadin yesterday due to slightly elevated INR with patient on Levaquin. Will resume warfarin at dosing of 2 mg  daily and f/u AM INR.   Ulice Dash, PharmD Clinical Pharmacist   03/23/2016 11:25 AM

## 2016-03-23 NOTE — Progress Notes (Signed)
Patient coughing heart rate increased and then went back down to 90's

## 2016-03-24 LAB — PROTIME-INR
INR: 2.69
Prothrombin Time: 28.2 seconds — ABNORMAL HIGH (ref 11.4–15.0)

## 2016-03-24 MED ORDER — AMIODARONE HCL 200 MG PO TABS: 200.0000 mg | ORAL_TABLET | Freq: Every day | ORAL | Status: DC

## 2016-03-24 MED ORDER — AZITHROMYCIN 250 MG PO TABS: ORAL_TABLET | ORAL | Status: DC

## 2016-03-24 MED ORDER — GUAIFENESIN ER 600 MG PO TB12: 600.0000 mg | ORAL_TABLET | Freq: Two times a day (BID) | ORAL | Status: DC

## 2016-03-24 MED ORDER — DILTIAZEM HCL ER 60 MG PO CP12: 60.0000 mg | ORAL_CAPSULE | Freq: Two times a day (BID) | ORAL | Status: DC

## 2016-03-24 MED ORDER — PREDNISONE 10 MG PO TABS: ORAL_TABLET | ORAL | Status: DC

## 2016-03-24 MED ORDER — DILTIAZEM HCL ER 60 MG PO CP12
60.0000 mg | ORAL_CAPSULE | Freq: Two times a day (BID) | ORAL | Status: DC
  Administered 2016-03-24: 60 mg via ORAL
  Filled 2016-03-24 (×2): qty 1

## 2016-03-24 NOTE — Progress Notes (Signed)
Pt discharged to home via wc.  Instructions given to pt, Amiodarone, Zithromax, Cardizem, Prednisone, Mucinex RX escribed to [facility] on [address].  Questions answered.  No distress.

## 2016-03-24 NOTE — Progress Notes (Signed)
ANTICOAGULATION CONSULT NOTE - Follow Up Consult  Pharmacy Consult for Warfarin  Indication: Hx of A. Fib and mechanical mitral valve.   Allergies  Allergen Reactions  . Atorvastatin     Other reaction(s): Unknown  . Calcium Carbonate     Other reaction(s): Unknown  . Fluoxetine     Other reaction(s): Unknown  . Hydrochlorothiazide     Other reaction(s): Unknown   Patient Measurements: Height: 5\' 2"  (157.5 cm) Weight: 189 lb 9.5 oz (86 kg) IBW/kg (Calculated) : 50.1  Vital Signs: Temp: 97.7 F (36.5 C) (07/04 0431) Temp Source: Oral (07/04 0431) BP: 130/74 mmHg (07/04 0431) Pulse Rate: 68 (07/04 0431)  Labs:  Recent Labs  03/22/16 0838 03/22/16 1328 03/22/16 1923 03/23/16 0030 03/24/16 0405  HGB 13.9  --   --  11.7*  --   HCT 40.9  --   --  34.8*  --   PLT 251  --   --  197  --   LABPROT 35.7*  --   --  33.0* 28.2*  INR 3.68  --   --  3.32 2.69  CREATININE 1.06*  --   --  1.04*  --   TROPONINI <0.03 <0.03 <0.03 <0.03  --     Estimated Creatinine Clearance: 51.3 mL/min (by C-G formula based on Cr of 1.04).   Medical History: Past Medical History  Diagnosis Date  . Hepatitis   . Atrial fibrillation ([place])   . Hyperlipemia   . Hypertension   . Breast cancer ([place])     RT Lumpectomy c radiation 2012  . Iron (Fe) deficiency anemia   . COPD (chronic obstructive pulmonary disease) ([place])    Lab Results  Component Value Date   INR 2.69 03/24/2016   INR 3.32 03/23/2016   INR 3.68 03/22/2016    Assessment: 70 yo female with PMH of A. Fib and mitral valve replacement. Pharmacy consulted for dosing and monitoring of warfarin. Patient was alternating warfarin 2mg   every other day with warfarin 4 mg every other day at home.   7/2 INR: 3.68 Coumadin: none 7/3 INR: 3.32 Coumadin 2mg  given.   7/4 INR: 2.69  Goal of Therapy:  INR 2.5-3.5 Monitor platelets by anticoagulation protocol: Yes   Plan:  INR is therapeutic.  Will continue warfarin 2 mg daily and f/u  AM INR.   Olivia Canter, [facility] Clinical Pharmacist   03/24/2016, 7:25 AM

## 2016-03-24 NOTE — Care Management (Signed)
O2 sats 95-97% on RA. Does not qualify for home O2.

## 2016-03-24 NOTE — Progress Notes (Signed)
Heart rate went up to the 140's while patient was in the bathroom and return to bed to look for dentures. After returning to bed, within minutes heart rate is in the 90's.

## 2016-03-24 NOTE — Discharge Summary (Signed)
[facility] [place] NAME: Adriana Martin    MR#:  [id number]  DATE OF BIRTH:  02-24-1946  DATE OF ADMISSION:  03/22/2016 ADMITTING PHYSICIAN: Theodoro Grist, MD  DATE OF DISCHARGE:03/24/16  PRIMARY CARE PHYSICIAN: THIES, DAVID, MD    ADMISSION DIAGNOSIS:  Shortness of breath [R06.02] Bronchitis [J40] Atrial flutter with rapid ventricular response (HCC) [I48.92]  DISCHARGE DIAGNOSIS:  Acute on chronic COPD exacerbation Acute bronchitis Acute on chronic transient Afib  SECONDARY DIAGNOSIS:   Past Medical History  Diagnosis Date  . Hepatitis   . Atrial fibrillation ([place])   . Hyperlipemia   . Hypertension   . Breast cancer ([place])     RT Lumpectomy c radiation 2012  . Iron (Fe) deficiency anemia   . COPD (chronic obstructive pulmonary disease) [facility])     HOSPITAL COURSE:  Adriana Martin is a 70 y.o. female with a known history of atrial fibrillation, chronic, hyperlipidemia, hypertension, mechanical valve, on chronic anticoagulation with Coumadin, who presents to the hospital with complaints of shortness of breath, cough, green phlegm production, weakness, feeling no energy  #1. COPD exacerbation - on steroids, inhalation therapy, antibiotics   #2. Acute bronchitis -po zithromax  #3. A. fib, RVR, continue patient was on Cardizem IV drip, continue Coumadin, pro time is 35.7, INR 3.68--2.6 -cont cardiac meds including CCB oral ,amiodarone and BB. -intermittent asymptomatic tachycardia with acitivity  #4. Hyperglycemia -A1c 6.1 Will defer for PCP to follow sugars as oupt -pt also on chronic steroids  Will d/c home later today Pt has been requesting since yday CONSULTS OBTAINED:     DRUG ALLERGIES:   Allergies  Allergen Reactions  . Atorvastatin     Other reaction(s): Unknown  . Calcium Carbonate     Other reaction(s): Unknown  . Fluoxetine     Other reaction(s): Unknown  . Hydrochlorothiazide     Other  reaction(s): Unknown    DISCHARGE MEDICATIONS:   Current Discharge Medication List    START taking these medications   Details  azithromycin (ZITHROMAX) 250 MG tablet Take as directed Qty: 4 each, Refills: 0    diltiazem (CARDIZEM SR) 60 MG 12 hr capsule Take 1 capsule (60 mg total) by mouth every 12 (twelve) hours. Qty: 60 capsule, Refills: 0    guaiFENesin (MUCINEX) 600 MG 12 hr tablet Take 1 tablet (600 mg total) by mouth 2 (two) times daily. Qty: 15 tablet, Refills: 0    !! predniSONE (DELTASONE) 10 MG tablet Take 50 mg daily taper by 10 mg daily then cont your daily 5 mg thereafter Qty: 15 tablet, Refills: 0     !! - Potential duplicate medications found. Please discuss with provider.    CONTINUE these medications which have CHANGED   Details  amiodarone (PACERONE) 200 MG tablet Take 1 tablet (200 mg total) by mouth daily. Qty: 30 tablet, Refills: 0      CONTINUE these medications which have NOT CHANGED   Details  ADVAIR DISKUS 100-50 MCG/DOSE AEPB Inhale 1 puff into the lungs 2 (two) times daily.  Refills: 0    alendronate (FOSAMAX) 70 MG tablet Take 70 mg by mouth once a week. Take with a full glass of water on an empty stomach. *Take on Sunday*    ALPRAZolam (XANAX) 0.5 MG tablet Take 0.5 mg by mouth 2 (two) times daily as needed for anxiety.    anastrozole (ARIMIDEX) 1 MG tablet Take 1 tablet (1 mg total) by mouth daily. Qty:  30 tablet, Refills: 6    aspirin EC 81 MG tablet Take 81 mg by mouth daily.    Ferrous Sulfate 134 MG TABS Take 1 tablet by mouth 2 (two) times daily.    hydroxyurea (HYDREA) 500 MG capsule TAKE 1 CAPSULE BY MOUTH 6 TIMES A WEEK Qty: 30 capsule, Refills: 0    levothyroxine (SYNTHROID, LEVOTHROID) 50 MCG tablet Take 50 mcg by mouth daily before breakfast.    lisinopril (PRINIVIL,ZESTRIL) 5 MG tablet Take 5 mg by mouth daily.  Refills: 98   Associated Diagnoses: Iron deficiency anemia    metoprolol succinate (TOPROL-XL) 50 MG 24 hr  tablet Take 50 mg by mouth 2 (two) times daily. Take with or immediately following a meal.    Multiple Vitamins-Minerals (CENTRUM SILVER ULTRA WOMENS) TABS Take 1 tablet by mouth daily.    pravastatin (PRAVACHOL) 20 MG tablet Take 20 mg by mouth at bedtime.    !! predniSONE (DELTASONE) 5 MG tablet Take 5 mg by mouth daily with breakfast.    PROAIR HFA 108 (90 BASE) MCG/ACT inhaler 2 puffs every 4 (four) hours as needed for wheezing or shortness of breath.  Refills: 0    Probiotic Product (PHILLIPS COLON HEALTH) CAPS Take 1 capsule by mouth daily.    ursodiol (ACTIGALL) 300 MG capsule Take 300 mg by mouth 4 (four) times daily.     Vitamin D, Ergocalciferol, (DRISDOL) 50000 units CAPS capsule Take 50,000 Units by mouth every 30 (thirty) days.    Associated Diagnoses: Iron deficiency anemia    warfarin (COUMADIN) 2 MG tablet Take 2-4 mg by mouth See admin instructions. Take 1 tablet by mouth every other day, alternating with 2 tablets (4 mg) by mouth every other day. Refills: 5     !! - Potential duplicate medications found. Please discuss with provider.      If you experience worsening of your admission symptoms, develop shortness of breath, life threatening emergency, suicidal or homicidal thoughts you must seek medical attention immediately by calling 911 or calling your MD immediately  if symptoms less severe.  You Must read complete instructions/literature along with all the possible adverse reactions/side effects for all the Medicines you take and that have been prescribed to you. Take any new Medicines after you have completely understood and accept all the possible adverse reactions/side effects.   Please note  You were cared for by a hospitalist during your hospital stay. If you have any questions about your discharge medications or the care you received while you were in the hospital after you are discharged, you can call the unit and asked to speak with the hospitalist on call  if the hospitalist that took care of you is not available. Once you are discharged, your primary care physician will handle any further medical issues. Please note that NO REFILLS for any discharge medications will be authorized once you are discharged, as it is imperative that you return to your primary care physician (or establish a relationship with a primary care physician if you do not have one) for your aftercare needs so that they can reassess your need for medications and monitor your lab values. Today   SUBJECTIVE   Requesting to go home  VITAL SIGNS:  Blood pressure 130/74, pulse 68, temperature 97.7 F (36.5 C), temperature source Oral, resp. rate 19, height 5\' 2"  (1.575 m), weight 86 kg (189 lb 9.5 oz), SpO2 95 %.  I/O:    Intake/Output Summary (Last 24 hours) at 03/24/16 0655 Last  data filed at 03/24/16 0301  Gross per 24 hour  Intake      0 ml  Output   1450 ml  Net  -1450 ml    PHYSICAL EXAMINATION:  GENERAL:  70 y.o.-year-old patient lying in the bed with no acute distress.  EYES: Pupils equal, round, reactive to light and accommodation. No scleral icterus. Extraocular muscles intact.  HEENT: Head atraumatic, normocephalic. Oropharynx and nasopharynx clear.  NECK:  Supple, no jugular venous distention. No thyroid enlargement, no tenderness.  LUNGS: Normal breath sounds bilaterally, no wheezing, rales,rhonchi or crepitation. No use of accessory muscles of respiration.  CARDIOVASCULAR: S1, S2 normal. No murmurs, rubs, or gallops. intermittent tachy ABDOMEN: Soft, non-tender, non-distended. Bowel sounds present. No organomegaly or mass.  EXTREMITIES: No pedal edema, cyanosis, or clubbing.  NEUROLOGIC: Cranial nerves II through XII are intact. Muscle strength 5/5 in all extremities. Sensation intact. Gait not checked.  PSYCHIATRIC: The patient is alert and oriented x 3.  SKIN: No obvious rash, lesion, or ulcer.   DATA REVIEW:   CBC   Recent Labs Lab 03/23/16 0030   WBC 4.5  HGB 11.7*  HCT 34.8*  PLT 197    Chemistries   Recent Labs Lab 03/23/16 0030  NA 134*  K 4.2  CL 98*  CO2 26  GLUCOSE 158*  BUN 15  CREATININE 1.04*  CALCIUM 9.0    Microbiology Results   Recent Results (from the past 240 hour(s))  MRSA PCR Screening     Status: None   Collection Time: 03/22/16 12:29 PM  Result Value Ref Range Status   MRSA by PCR NEGATIVE NEGATIVE Final    Comment:        The GeneXpert MRSA Assay (FDA approved for NASAL specimens only), is one component of a comprehensive MRSA colonization surveillance program. It is not intended to diagnose MRSA infection nor to guide or monitor treatment for MRSA infections.     RADIOLOGY:  Dg Chest Port 1 View  03/22/2016  CLINICAL DATA:  Productive cough for 2 days EXAM: PORTABLE CHEST 1 VIEW COMPARISON:  05/02/2015 FINDINGS: Cardiac shadow remains enlarged. Postsurgical changes are again seen. The lungs are well aerated bilaterally. Calcified granuloma is again noted on the right and stable. No new focal infiltrate is seen. No bony abnormality is noted. IMPRESSION: No active disease. Electronically Signed   By: Inez Catalina M.D.   On: 03/22/2016 07:57     Management plans discussed with the patient, family and they are in agreement.  CODE STATUS:     Code Status Orders        Start     Ordered   03/22/16 1233  Full code   Continuous     03/22/16 1232    Code Status History    Date Active Date Inactive Code Status Order ID Comments User Context   05/02/2015  2:43 PM 05/03/2015  8:03 PM Full Code [id number]  Vaughan Basta, MD Inpatient      TOTAL TIME TAKING CARE OF THIS PATIENT: 40 minutes.    Sergey Ishler M.D on 03/24/2016 at 6:55 AM  Between 7am to 6pm - Pager - [phone] After 6pm go to [email or web address] - password [email or web address] [facility]  Office  [phone]  CC: Primary care physician; [name], MD

## 2016-04-08 DIAGNOSIS — I4891 Unspecified atrial fibrillation: Secondary | ICD-10-CM | POA: Diagnosis present

## 2016-04-09 ENCOUNTER — Other Ambulatory Visit: Payer: Self-pay | Admitting: Nurse Practitioner

## 2016-04-09 DIAGNOSIS — K743 Primary biliary cirrhosis: Secondary | ICD-10-CM

## 2016-04-13 ENCOUNTER — Ambulatory Visit
Admission: RE | Admit: 2016-04-13 | Discharge: 2016-04-13 | Disposition: A | Discharge status: Home / Self Care | Payer: Medicare Other | Source: Ambulatory Visit | Attending: Nurse Practitioner | Admitting: Nurse Practitioner

## 2016-04-13 DIAGNOSIS — K743 Primary biliary cirrhosis: Secondary | ICD-10-CM | POA: Diagnosis not present

## 2016-04-16 ENCOUNTER — Encounter
Admission: RE | Disposition: A | Discharge status: Home / Self Care | Payer: Self-pay | Source: Ambulatory Visit | Attending: Internal Medicine

## 2016-04-16 ENCOUNTER — Encounter: Payer: Self-pay | Admitting: *Deleted

## 2016-04-16 ENCOUNTER — Ambulatory Visit: Payer: Medicare Other | Admitting: Anesthesiology

## 2016-04-16 ENCOUNTER — Ambulatory Visit
Admission: RE | Admit: 2016-04-16 | Discharge: 2016-04-16 | Disposition: A | Discharge status: Home / Self Care | Payer: Medicare Other | Source: Ambulatory Visit | Attending: Internal Medicine | Admitting: Internal Medicine

## 2016-04-16 DIAGNOSIS — I4891 Unspecified atrial fibrillation: Secondary | ICD-10-CM | POA: Diagnosis present

## 2016-04-16 DIAGNOSIS — I4892 Unspecified atrial flutter: Secondary | ICD-10-CM | POA: Diagnosis present

## 2016-04-16 DIAGNOSIS — Z5309 Procedure and treatment not carried out because of other contraindication: Secondary | ICD-10-CM | POA: Diagnosis not present

## 2016-04-16 HISTORY — DX: Depression, unspecified: F32.A

## 2016-04-16 HISTORY — DX: Gastro-esophageal reflux disease without esophagitis: K21.9

## 2016-04-16 HISTORY — DX: Major depressive disorder, single episode, unspecified: F32.9

## 2016-04-16 HISTORY — DX: Chronic kidney disease, unspecified: N18.9

## 2016-04-16 HISTORY — DX: Reserved for inherently not codable concepts without codable children: IMO0001

## 2016-04-16 HISTORY — DX: Cardiac murmur, unspecified: R01.1

## 2016-04-16 LAB — PROTIME-INR
INR: 1.33
PROTHROMBIN TIME: 16.6 s — AB (ref 11.4–15.2)

## 2016-04-16 SURGERY — CARDIOVERSION (CATH LAB)
Anesthesia: General

## 2016-04-16 NOTE — OR Nursing (Signed)
730 am. PT had stopped taking Coumadin ,per pt, office had called and told her to stop....but pt unclear if she had misunderstood.  Repeat INR today  reveals INR 1.33  Dr. Nehemiah Massed notified and case cancelled. MD instructed pt to resume coumadin as prevesiously prescribed. PT instructed that MD office shuild be calling about rescheduled date and time, however, if not notified by Monday, to call office and check.

## 2016-04-16 NOTE — Anesthesia Preprocedure Evaluation (Deleted)
Anesthesia Evaluation  Patient identified by MRN, date of birth, ID band Patient awake and Patient confused    Reviewed: Allergy & Precautions, NPO status , Patient's Chart, lab work & pertinent test results, reviewed documented beta blocker date and time   Airway Mallampati: II       Dental   Pulmonary neg pulmonary ROS, shortness of breath, asthma , COPD,  COPD inhaler, former smoker,    breath sounds clear to auscultation       Cardiovascular Exercise Tolerance: Poor hypertension, Pt. on home beta blockers and Pt. on medications + DOE  + dysrhythmias Atrial Fibrillation + Valvular Problems/Murmurs MR  Rhythm:Irregular     Neuro/Psych negative neurological ROS  negative psych ROS   GI/Hepatic hiatal hernia, GERD  Medicated,(+) Cirrhosis       ,   Endo/Other  negative endocrine ROS  Renal/GU Renal InsufficiencyRenal disease  negative genitourinary   Musculoskeletal negative musculoskeletal ROS (+)   Abdominal Normal abdominal exam  (+)   Peds negative pediatric ROS (+)  Hematology negative hematology ROS (+) anemia ,   Anesthesia Other Findings   Reproductive/Obstetrics negative OB ROS                             Anesthesia Physical  Anesthesia Plan  ASA: III  Anesthesia Plan: General   Post-op Pain Management:    Induction: Intravenous  Airway Management Planned: Natural Airway and Nasal Cannula  Additional Equipment:   Intra-op Plan:   Post-operative Plan:   Informed Consent: I have reviewed the patients History and Physical, chart, labs and discussed the procedure including the risks, benefits and alternatives for the proposed anesthesia with the patient or authorized representative who has indicated his/her understanding and acceptance.     Plan Discussed with: CRNA and Surgeon  Anesthesia Plan Comments:         Anesthesia Quick Evaluation

## 2016-04-26 ENCOUNTER — Other Ambulatory Visit: Payer: Self-pay | Admitting: Internal Medicine

## 2016-05-27 ENCOUNTER — Encounter
Admission: RE | Disposition: A | Discharge status: Home / Self Care | Payer: Self-pay | Source: Ambulatory Visit | Attending: Internal Medicine

## 2016-05-27 ENCOUNTER — Encounter: Payer: Self-pay | Admitting: *Deleted

## 2016-05-27 ENCOUNTER — Ambulatory Visit: Payer: Medicare Other | Admitting: Anesthesiology

## 2016-05-27 ENCOUNTER — Ambulatory Visit
Admission: RE | Admit: 2016-05-27 | Discharge: 2016-05-27 | Disposition: A | Discharge status: Home / Self Care | Payer: Medicare Other | Source: Ambulatory Visit | Attending: Internal Medicine | Admitting: Internal Medicine

## 2016-05-27 DIAGNOSIS — Z7982 Long term (current) use of aspirin: Secondary | ICD-10-CM | POA: Diagnosis not present

## 2016-05-27 DIAGNOSIS — Z853 Personal history of malignant neoplasm of breast: Secondary | ICD-10-CM | POA: Diagnosis not present

## 2016-05-27 DIAGNOSIS — Z8371 Family history of colonic polyps: Secondary | ICD-10-CM | POA: Insufficient documentation

## 2016-05-27 DIAGNOSIS — K745 Biliary cirrhosis, unspecified: Secondary | ICD-10-CM | POA: Insufficient documentation

## 2016-05-27 DIAGNOSIS — Z923 Personal history of irradiation: Secondary | ICD-10-CM | POA: Diagnosis not present

## 2016-05-27 DIAGNOSIS — Z9049 Acquired absence of other specified parts of digestive tract: Secondary | ICD-10-CM | POA: Insufficient documentation

## 2016-05-27 DIAGNOSIS — N183 Chronic kidney disease, stage 3 (moderate): Secondary | ICD-10-CM | POA: Insufficient documentation

## 2016-05-27 DIAGNOSIS — Z803 Family history of malignant neoplasm of breast: Secondary | ICD-10-CM | POA: Diagnosis not present

## 2016-05-27 DIAGNOSIS — E782 Mixed hyperlipidemia: Secondary | ICD-10-CM | POA: Diagnosis not present

## 2016-05-27 DIAGNOSIS — Z79899 Other long term (current) drug therapy: Secondary | ICD-10-CM | POA: Diagnosis not present

## 2016-05-27 DIAGNOSIS — Z90710 Acquired absence of both cervix and uterus: Secondary | ICD-10-CM | POA: Diagnosis not present

## 2016-05-27 DIAGNOSIS — I5032 Chronic diastolic (congestive) heart failure: Secondary | ICD-10-CM | POA: Diagnosis not present

## 2016-05-27 DIAGNOSIS — Z800 Family history of malignant neoplasm of digestive organs: Secondary | ICD-10-CM | POA: Insufficient documentation

## 2016-05-27 DIAGNOSIS — E559 Vitamin D deficiency, unspecified: Secondary | ICD-10-CM | POA: Insufficient documentation

## 2016-05-27 DIAGNOSIS — Z7951 Long term (current) use of inhaled steroids: Secondary | ICD-10-CM | POA: Insufficient documentation

## 2016-05-27 DIAGNOSIS — J449 Chronic obstructive pulmonary disease, unspecified: Secondary | ICD-10-CM | POA: Insufficient documentation

## 2016-05-27 DIAGNOSIS — Z807 Family history of other malignant neoplasms of lymphoid, hematopoietic and related tissues: Secondary | ICD-10-CM | POA: Insufficient documentation

## 2016-05-27 DIAGNOSIS — Z7901 Long term (current) use of anticoagulants: Secondary | ICD-10-CM | POA: Insufficient documentation

## 2016-05-27 DIAGNOSIS — Z954 Presence of other heart-valve replacement: Secondary | ICD-10-CM | POA: Insufficient documentation

## 2016-05-27 DIAGNOSIS — K219 Gastro-esophageal reflux disease without esophagitis: Secondary | ICD-10-CM | POA: Diagnosis not present

## 2016-05-27 DIAGNOSIS — D519 Vitamin B12 deficiency anemia, unspecified: Secondary | ICD-10-CM | POA: Insufficient documentation

## 2016-05-27 DIAGNOSIS — I130 Hypertensive heart and chronic kidney disease with heart failure and stage 1 through stage 4 chronic kidney disease, or unspecified chronic kidney disease: Secondary | ICD-10-CM | POA: Insufficient documentation

## 2016-05-27 DIAGNOSIS — Z87891 Personal history of nicotine dependence: Secondary | ICD-10-CM | POA: Diagnosis not present

## 2016-05-27 DIAGNOSIS — I480 Paroxysmal atrial fibrillation: Secondary | ICD-10-CM | POA: Diagnosis present

## 2016-05-27 DIAGNOSIS — Z9889 Other specified postprocedural states: Secondary | ICD-10-CM | POA: Insufficient documentation

## 2016-05-27 DIAGNOSIS — Z818 Family history of other mental and behavioral disorders: Secondary | ICD-10-CM | POA: Insufficient documentation

## 2016-05-27 DIAGNOSIS — Z8249 Family history of ischemic heart disease and other diseases of the circulatory system: Secondary | ICD-10-CM | POA: Insufficient documentation

## 2016-05-27 HISTORY — DX: Vitamin D deficiency, unspecified: E55.9

## 2016-05-27 HISTORY — DX: Thrombocytopenia, unspecified: D69.6

## 2016-05-27 HISTORY — DX: Anxiety disorder, unspecified: F41.9

## 2016-05-27 HISTORY — DX: Headache, unspecified: R51.9

## 2016-05-27 HISTORY — DX: Heart failure, unspecified: I50.9

## 2016-05-27 HISTORY — DX: Headache: R51

## 2016-05-27 HISTORY — DX: Anemia, unspecified: D64.9

## 2016-05-27 HISTORY — DX: Unspecified cirrhosis of liver: K74.60

## 2016-05-27 HISTORY — PX: ELECTROPHYSIOLOGIC STUDY: SHX172A

## 2016-05-27 HISTORY — DX: Deficiency of other specified B group vitamins: E53.8

## 2016-05-27 HISTORY — DX: Hyperlipidemia, unspecified: E78.5

## 2016-05-27 LAB — PROTIME-INR
INR: 2.22
Prothrombin Time: 25 seconds — ABNORMAL HIGH (ref 11.4–15.2)

## 2016-05-27 SURGERY — CARDIOVERSION (CATH LAB)
Anesthesia: General

## 2016-05-27 MED ORDER — PROPOFOL 10 MG/ML IV BOLUS
INTRAVENOUS | Status: DC | PRN
  Administered 2016-05-27: 50 mg via INTRAVENOUS
  Administered 2016-05-27: 20 mg via INTRAVENOUS

## 2016-05-27 MED ORDER — SODIUM CHLORIDE 0.9 % IV SOLN
INTRAVENOUS | Status: DC
  Administered 2016-05-27: 08:00:00 via INTRAVENOUS

## 2016-05-27 NOTE — Anesthesia Postprocedure Evaluation (Signed)
Anesthesia Post Note  Patient: [name]  Procedure(s) Performed: Procedure(s) (LRB): CARDIOVERSION (N/A)  Patient location during evaluation: Other Anesthesia Type: General Level of consciousness: awake and alert and oriented Pain management: pain level controlled Vital Signs Assessment: post-procedure vital signs reviewed and stable Respiratory status: spontaneous breathing, nonlabored ventilation and respiratory function stable Cardiovascular status: blood pressure returned to baseline and stable Postop Assessment: no signs of nausea or vomiting Anesthetic complications: no    Last Vitals:  Vitals:   05/27/16 0812 05/27/16 0823  BP: 107/65 106/60  Pulse: 77 78  Resp: 14 15  Temp:      Last Pain:  Vitals:   05/27/16 0712  TempSrc: Oral                 Adriana Martin

## 2016-05-27 NOTE — Transfer of Care (Signed)
Immediate Anesthesia Transfer of Care Note  Patient: Adriana Martin  Procedure(s) Performed: Procedure(s): CARDIOVERSION (N/A)  Patient Location: PACU  Anesthesia Type:General  Level of Consciousness: awake and alert   Airway & Oxygen Therapy: Patient Spontanous Breathing and Patient connected to nasal cannula oxygen  Post-op Assessment: Report given to RN and Post -op Vital signs reviewed and stable  Post vital signs: Reviewed and stable  Last Vitals:  Vitals:   05/27/16 0759 05/27/16 0800  BP:  101/74  Pulse: 76 76  Resp: 15 18  Temp:      Last Pain:  Vitals:   05/27/16 0712  TempSrc: Oral         Complications: No apparent anesthesia complications

## 2016-05-27 NOTE — Anesthesia Preprocedure Evaluation (Addendum)
Anesthesia Evaluation  Patient identified by MRN, date of birth, ID band Patient awake    Reviewed: Allergy & Precautions, NPO status , Patient's Chart, lab work & pertinent test results  History of Anesthesia Complications Negative for: history of anesthetic complications  Airway Mallampati: II  TM Distance: >3 FB Neck ROM: Full    Dental  (+) Lower Dentures, Upper Dentures   Pulmonary shortness of breath, neg sleep apnea, COPD, former smoker,    breath sounds clear to auscultation- rhonchi (-) wheezing      Cardiovascular hypertension, Pt. on medications (-) CAD and (-) Past MI + dysrhythmias Atrial Fibrillation + Valvular Problems/Murmurs (hx of mitral valve replacement)  Rhythm:Irregular Rate:Normal - Systolic murmurs and - Diastolic murmurs Cardiac MRI 04/09/15: 1. The left ventricle is normal incavity size and wall thickness. Global systolic function is normal. The LV ejection fraction is visually estimated at 65%. There are no regional wall motion abnormalities.  2. The right ventricle is normal in cavity size, wall thickness, and systolic function.   3. The left atrium is moderately enlarged.Compared to the prior scan, the left atrium is smaller in size. The left atrial appendage is enlarged.The right atrium is mildly enlarged in size.  4. A bioprosthetic mitral valve is seen well-seated, with no evidence of dehiscence. Leaflet motion cannot be evaluated due to metal artifact. There is mild mitral regurgitation.  5. The aortic valve is trileaflet, with no significant aortic valve stenosis or regurgitation.   6.There is trivial tricuspid regurgitation.   7. Delayed enhancement imaging demonstrates no evidence of myocardial infarction, scarring, or infiltration.  8. There is no evidence of intracardiac thrombus.   Neuro/Psych Anxiety Depression negative neurological ROS     GI/Hepatic Neg liver ROS, GERD  ,   Endo/Other  negative endocrine ROSneg diabetesHypothyroidism   Renal/GU CRFRenal disease     Musculoskeletal negative musculoskeletal ROS (+)   Abdominal (+) + obese,   Peds  Hematology  (+) anemia ,   Anesthesia Other Findings Past Medical History: No date: Atrial fibrillation (HCC) No date: Breast cancer ([place])     Comment: RT Lumpectomy c radiation 2012 No date: Chronic kidney disease No date: COPD (chronic obstructive pulmonary disease) (* No date: Depression No date: GERD (gastroesophageal reflux disease) No date: Heart murmur No date: Hyperlipemia No date: Hypertension No date: Iron (Fe) deficiency anemia No date: Shortness of breath dyspnea   Reproductive/Obstetrics                            Anesthesia Physical Anesthesia Plan  ASA: III  Anesthesia Plan: General   Post-op Pain Management:    Induction: Intravenous  Airway Management Planned: Natural Airway  Additional Equipment:   Intra-op Plan:   Post-operative Plan:   Informed Consent: I have reviewed the patients History and Physical, chart, labs and discussed the procedure including the risks, benefits and alternatives for the proposed anesthesia with the patient or authorized representative who has indicated his/her understanding and acceptance.   Dental advisory given  Plan Discussed with: CRNA and Anesthesiologist  Anesthesia Plan Comments:         Anesthesia Quick Evaluation

## 2016-05-27 NOTE — CV Procedure (Signed)
Electrical Cardioversion Procedure Note Adriana Martin [id number] 02/21/1946  Procedure: Electrical Cardioversion Indications:  Atrial Fibrillation  Procedure Details Consent: Risks of procedure as well as the alternatives and risks of each were explained to the (patient/caregiver).  Consent for procedure obtained. Time Out: Verified patient identification, verified procedure, site/side was marked, verified correct patient position, special equipment/implants available, medications/allergies/relevent history reviewed, required imaging and test results available.  Performed  Patient placed on cardiac monitor, pulse oximetry, supplemental oxygen as necessary.  Sedation given: Benzodiazepines Pacer pads placed anterior and posterior chest.  Cardioverted 1 time(s).  Cardioverted at 120J.  Evaluation Findings: Post procedure EKG shows: NSR Complications: None Patient did tolerate procedure well.   Corey Skains 05/27/2016, 7:59 AM

## 2016-05-27 NOTE — Discharge Instructions (Signed)
Electrical Cardioversion, Care After °Refer to this sheet in the next few weeks. These instructions provide you with information on caring for yourself after your procedure. Your health care provider may also give you more specific instructions. Your treatment has been planned according to current medical practices, but problems sometimes occur. Call your health care provider if you have any problems or questions after your procedure. °WHAT TO EXPECT AFTER THE PROCEDURE °After your procedure, it is typical to have the following sensations: °· Some redness on the skin where the shocks were delivered. If this is tender, a sunburn lotion or hydrocortisone cream may help. °· Possible return of an abnormal heart rhythm within hours or days after the procedure. °HOME CARE INSTRUCTIONS °· Take medicines only as directed by your health care provider. Be sure you understand how and when to take your medicine. °· Learn how to feel your pulse and check it often. °· Limit your activity for 48 hours after the procedure or as directed by your health care provider. °· Avoid or minimize caffeine and other stimulants as directed by your health care provider. °SEEK MEDICAL CARE IF: °· You feel like your heart is beating too fast or your pulse is not regular. °· You have any questions about your medicines. °· You have bleeding that will not stop. °SEEK IMMEDIATE MEDICAL CARE IF: °· You are dizzy or feel faint. °· It is hard to breathe or you feel short of breath. °· There is a change in discomfort in your chest. °· Your speech is slurred or you have trouble moving an arm or leg on one side of your body. °· You get a serious muscle cramp that does not go away. °· Your fingers or toes turn cold or blue. °  °This information is not intended to replace advice given to you by your health care provider. Make sure you discuss any questions you have with your health care provider. °  °Document Released: 06/28/2013 Document Revised: 09/28/2014  Document Reviewed: 06/28/2013 °[employer]vier Interactive Patient Education ©2016 [employer] ° °

## 2016-05-28 ENCOUNTER — Encounter: Payer: Self-pay | Admitting: Internal Medicine

## 2016-05-31 ENCOUNTER — Other Ambulatory Visit: Payer: Self-pay | Admitting: Internal Medicine

## 2016-06-07 ENCOUNTER — Emergency Department: Payer: Medicare Other

## 2016-06-07 ENCOUNTER — Encounter: Payer: Self-pay | Admitting: Emergency Medicine

## 2016-06-07 ENCOUNTER — Inpatient Hospital Stay
Admission: EM | Admit: 2016-06-07 | Discharge: 2016-06-10 | DRG: 291 | Disposition: A | Discharge status: Home / Self Care | Payer: Medicare Other | Attending: Internal Medicine | Admitting: Internal Medicine

## 2016-06-07 ENCOUNTER — Inpatient Hospital Stay
Admit: 2016-06-07 | Discharge: 2016-06-07 | Disposition: A | Discharge status: Home / Self Care | Payer: Medicare Other | Attending: Adult Health | Admitting: Adult Health

## 2016-06-07 DIAGNOSIS — J9600 Acute respiratory failure, unspecified whether with hypoxia or hypercapnia: Secondary | ICD-10-CM

## 2016-06-07 DIAGNOSIS — Z7983 Long term (current) use of bisphosphonates: Secondary | ICD-10-CM | POA: Diagnosis not present

## 2016-06-07 DIAGNOSIS — F419 Anxiety disorder, unspecified: Secondary | ICD-10-CM | POA: Diagnosis present

## 2016-06-07 DIAGNOSIS — J9601 Acute respiratory failure with hypoxia: Secondary | ICD-10-CM | POA: Diagnosis present

## 2016-06-07 DIAGNOSIS — K219 Gastro-esophageal reflux disease without esophagitis: Secondary | ICD-10-CM | POA: Diagnosis present

## 2016-06-07 DIAGNOSIS — Z87891 Personal history of nicotine dependence: Secondary | ICD-10-CM

## 2016-06-07 DIAGNOSIS — I509 Heart failure, unspecified: Secondary | ICD-10-CM

## 2016-06-07 DIAGNOSIS — E538 Deficiency of other specified B group vitamins: Secondary | ICD-10-CM | POA: Diagnosis present

## 2016-06-07 DIAGNOSIS — Z7901 Long term (current) use of anticoagulants: Secondary | ICD-10-CM | POA: Diagnosis not present

## 2016-06-07 DIAGNOSIS — I5041 Acute combined systolic (congestive) and diastolic (congestive) heart failure: Principal | ICD-10-CM | POA: Diagnosis present

## 2016-06-07 DIAGNOSIS — I959 Hypotension, unspecified: Secondary | ICD-10-CM | POA: Diagnosis present

## 2016-06-07 DIAGNOSIS — Z923 Personal history of irradiation: Secondary | ICD-10-CM

## 2016-06-07 DIAGNOSIS — E785 Hyperlipidemia, unspecified: Secondary | ICD-10-CM | POA: Diagnosis present

## 2016-06-07 DIAGNOSIS — Z953 Presence of xenogenic heart valve: Secondary | ICD-10-CM | POA: Diagnosis not present

## 2016-06-07 DIAGNOSIS — Z853 Personal history of malignant neoplasm of breast: Secondary | ICD-10-CM

## 2016-06-07 DIAGNOSIS — Z7982 Long term (current) use of aspirin: Secondary | ICD-10-CM

## 2016-06-07 DIAGNOSIS — R0602 Shortness of breath: Secondary | ICD-10-CM

## 2016-06-07 DIAGNOSIS — N183 Chronic kidney disease, stage 3 (moderate): Secondary | ICD-10-CM | POA: Diagnosis present

## 2016-06-07 DIAGNOSIS — Z79899 Other long term (current) drug therapy: Secondary | ICD-10-CM

## 2016-06-07 DIAGNOSIS — D509 Iron deficiency anemia, unspecified: Secondary | ICD-10-CM | POA: Diagnosis present

## 2016-06-07 DIAGNOSIS — Z803 Family history of malignant neoplasm of breast: Secondary | ICD-10-CM

## 2016-06-07 DIAGNOSIS — Z7952 Long term (current) use of systemic steroids: Secondary | ICD-10-CM

## 2016-06-07 DIAGNOSIS — J810 Acute pulmonary edema: Secondary | ICD-10-CM

## 2016-06-07 DIAGNOSIS — E039 Hypothyroidism, unspecified: Secondary | ICD-10-CM | POA: Diagnosis present

## 2016-06-07 DIAGNOSIS — I480 Paroxysmal atrial fibrillation: Secondary | ICD-10-CM | POA: Diagnosis present

## 2016-06-07 DIAGNOSIS — Z79811 Long term (current) use of aromatase inhibitors: Secondary | ICD-10-CM | POA: Diagnosis not present

## 2016-06-07 DIAGNOSIS — E559 Vitamin D deficiency, unspecified: Secondary | ICD-10-CM | POA: Diagnosis present

## 2016-06-07 DIAGNOSIS — J441 Chronic obstructive pulmonary disease with (acute) exacerbation: Secondary | ICD-10-CM | POA: Diagnosis present

## 2016-06-07 DIAGNOSIS — I5023 Acute on chronic systolic (congestive) heart failure: Secondary | ICD-10-CM | POA: Diagnosis not present

## 2016-06-07 DIAGNOSIS — K7460 Unspecified cirrhosis of liver: Secondary | ICD-10-CM | POA: Diagnosis present

## 2016-06-07 DIAGNOSIS — I130 Hypertensive heart and chronic kidney disease with heart failure and stage 1 through stage 4 chronic kidney disease, or unspecified chronic kidney disease: Secondary | ICD-10-CM | POA: Diagnosis present

## 2016-06-07 DIAGNOSIS — R0600 Dyspnea, unspecified: Secondary | ICD-10-CM

## 2016-06-07 DIAGNOSIS — I4891 Unspecified atrial fibrillation: Secondary | ICD-10-CM

## 2016-06-07 LAB — BASIC METABOLIC PANEL
ANION GAP: 12 (ref 5–15)
BUN: 15 mg/dL (ref 6–20)
CHLORIDE: 99 mmol/L — AB (ref 101–111)
CO2: 25 mmol/L (ref 22–32)
Calcium: 9.1 mg/dL (ref 8.9–10.3)
Creatinine, Ser: 1.1 mg/dL — ABNORMAL HIGH (ref 0.44–1.00)
GFR calc Af Amer: 57 mL/min — ABNORMAL LOW (ref >60)
GFR, EST NON AFRICAN AMERICAN: 49 mL/min — AB (ref >60)
GLUCOSE: 93 mg/dL (ref 65–99)
POTASSIUM: 3.9 mmol/L (ref 3.5–5.1)
Sodium: 136 mmol/L (ref 135–145)

## 2016-06-07 LAB — CBC
HEMATOCRIT: 36.1 % (ref 35.0–47.0)
HEMOGLOBIN: 12.3 g/dL (ref 12.0–16.0)
MCH: 34.8 pg — AB (ref 26.0–34.0)
MCHC: 34.1 g/dL (ref 32.0–36.0)
MCV: 101.9 fL — AB (ref 80.0–100.0)
Platelets: 282 10*3/uL (ref 150–440)
RBC: 3.54 MIL/uL — ABNORMAL LOW (ref 3.80–5.20)
RDW: 16.3 % — ABNORMAL HIGH (ref 11.5–14.5)
WBC: 12.7 10*3/uL — ABNORMAL HIGH (ref 3.6–11.0)

## 2016-06-07 LAB — BRAIN NATRIURETIC PEPTIDE: B Natriuretic Peptide: 676 pg/mL — ABNORMAL HIGH (ref 0.0–100.0)

## 2016-06-07 LAB — PROTIME-INR
INR: 2.77
Prothrombin Time: 29.8 seconds — ABNORMAL HIGH (ref 11.4–15.2)

## 2016-06-07 LAB — GLUCOSE, CAPILLARY: GLUCOSE-CAPILLARY: 171 mg/dL — AB (ref 65–99)

## 2016-06-07 LAB — TROPONIN I
TROPONIN I: 0.03 ng/mL — AB (ref <0.03)
Troponin I: <0.03 ng/mL (ref <0.03)
Troponin I: <0.03 ng/mL (ref <0.03)

## 2016-06-07 LAB — ECHOCARDIOGRAM COMPLETE
Height: 62 in
Weight: 3075.86 oz

## 2016-06-07 LAB — MRSA PCR SCREENING: MRSA BY PCR: NEGATIVE

## 2016-06-07 MED ORDER — AMIODARONE HCL 200 MG PO TABS: 200.0000 mg | ORAL_TABLET | Freq: Every day | ORAL | Status: DC

## 2016-06-07 MED ORDER — FERROUS SULFATE 134 MG PO TABS: 1.0000 | ORAL_TABLET | Freq: Two times a day (BID) | ORAL | Status: DC

## 2016-06-07 MED ORDER — FUROSEMIDE 10 MG/ML IJ SOLN
40.0000 mg | Freq: Once | INTRAMUSCULAR | Status: AC
  Administered 2016-06-07: 40 mg via INTRAVENOUS
  Filled 2016-06-07: qty 4

## 2016-06-07 MED ORDER — WARFARIN - PHARMACIST DOSING INPATIENT
Freq: Every day | Status: DC
  Administered 2016-06-08 – 2016-06-09 (×2)

## 2016-06-07 MED ORDER — IPRATROPIUM-ALBUTEROL 0.5-2.5 (3) MG/3ML IN SOLN
3.0000 mL | Freq: Once | RESPIRATORY_TRACT | Status: AC
  Administered 2016-06-07: 3 mL via RESPIRATORY_TRACT
  Filled 2016-06-07: qty 3

## 2016-06-07 MED ORDER — URSODIOL 300 MG PO CAPS
300.0000 mg | ORAL_CAPSULE | Freq: Four times a day (QID) | ORAL | Status: DC
  Administered 2016-06-07 – 2016-06-10 (×13): 300 mg via ORAL
  Filled 2016-06-07 (×14): qty 1

## 2016-06-07 MED ORDER — METOPROLOL SUCCINATE ER 50 MG PO TB24
50.0000 mg | ORAL_TABLET | Freq: Two times a day (BID) | ORAL | Status: DC
  Administered 2016-06-07 – 2016-06-10 (×7): 50 mg via ORAL
  Filled 2016-06-07 (×8): qty 1

## 2016-06-07 MED ORDER — HEPARIN SODIUM (PORCINE) 5000 UNIT/ML IJ SOLN: 5000.0000 [IU] | Freq: Three times a day (TID) | INTRAMUSCULAR | Status: DC

## 2016-06-07 MED ORDER — PREDNISONE 2.5 MG PO TABS
5.0000 mg | ORAL_TABLET | Freq: Every day | ORAL | Status: DC
  Administered 2016-06-07 – 2016-06-10 (×4): 5 mg via ORAL
  Filled 2016-06-07 (×3): qty 2
  Filled 2016-06-07: qty 1

## 2016-06-07 MED ORDER — ASPIRIN 300 MG RE SUPP: 300.0000 mg | RECTAL | Status: DC

## 2016-06-07 MED ORDER — IPRATROPIUM-ALBUTEROL 0.5-2.5 (3) MG/3ML IN SOLN
3.0000 mL | Freq: Four times a day (QID) | RESPIRATORY_TRACT | Status: DC
  Administered 2016-06-07 – 2016-06-08 (×3): 3 mL via RESPIRATORY_TRACT
  Filled 2016-06-07 (×3): qty 3

## 2016-06-07 MED ORDER — LISINOPRIL 5 MG PO TABS
5.0000 mg | ORAL_TABLET | Freq: Every day | ORAL | Status: DC
  Administered 2016-06-07: 5 mg via ORAL
  Filled 2016-06-07 (×2): qty 1

## 2016-06-07 MED ORDER — PRAVASTATIN SODIUM 20 MG PO TABS
20.0000 mg | ORAL_TABLET | Freq: Every day | ORAL | Status: DC
  Administered 2016-06-07 – 2016-06-09 (×3): 20 mg via ORAL
  Filled 2016-06-07 (×3): qty 1

## 2016-06-07 MED ORDER — HYDROXYUREA 500 MG PO CAPS: 500.0000 mg | ORAL_CAPSULE | Freq: Every day | ORAL | Status: DC

## 2016-06-07 MED ORDER — BUDESONIDE 0.25 MG/2ML IN SUSP
0.2500 mg | Freq: Two times a day (BID) | RESPIRATORY_TRACT | Status: DC
  Administered 2016-06-07 – 2016-06-08 (×2): 0.25 mg via RESPIRATORY_TRACT
  Filled 2016-06-07 (×3): qty 2

## 2016-06-07 MED ORDER — SODIUM CHLORIDE 0.9 % IV SOLN: 250.0000 mL | INTRAVENOUS | Status: DC | PRN

## 2016-06-07 MED ORDER — LEVOTHYROXINE SODIUM 50 MCG PO TABS
50.0000 ug | ORAL_TABLET | Freq: Every day | ORAL | Status: DC
  Administered 2016-06-07 – 2016-06-10 (×4): 50 ug via ORAL
  Filled 2016-06-07 (×4): qty 1

## 2016-06-07 MED ORDER — DILTIAZEM HCL 25 MG/5ML IV SOLN
15.0000 mg | Freq: Once | INTRAVENOUS | Status: AC
  Administered 2016-06-07: 15 mg via INTRAVENOUS
  Filled 2016-06-07: qty 5

## 2016-06-07 MED ORDER — ANASTROZOLE 1 MG PO TABS
1.0000 mg | ORAL_TABLET | Freq: Every day | ORAL | Status: DC
  Administered 2016-06-07 – 2016-06-10 (×4): 1 mg via ORAL
  Filled 2016-06-07 (×4): qty 1

## 2016-06-07 MED ORDER — DILTIAZEM HCL 25 MG/5ML IV SOLN
20.0000 mg | Freq: Once | INTRAVENOUS | Status: AC
  Administered 2016-06-07: 20 mg via INTRAVENOUS
  Filled 2016-06-07: qty 5

## 2016-06-07 MED ORDER — FUROSEMIDE 10 MG/ML IJ SOLN
40.0000 mg | Freq: Every day | INTRAMUSCULAR | Status: DC
  Administered 2016-06-07 – 2016-06-08 (×2): 40 mg via INTRAVENOUS
  Filled 2016-06-07 (×2): qty 4

## 2016-06-07 MED ORDER — DILTIAZEM HCL 100 MG IV SOLR
5.0000 mg/h | INTRAVENOUS | Status: DC
  Administered 2016-06-07: 5 mg/h via INTRAVENOUS
  Administered 2016-06-07: 13 mg/h via INTRAVENOUS
  Administered 2016-06-08: 6 mg/h via INTRAVENOUS
  Administered 2016-06-08: 10.5 mg/h via INTRAVENOUS
  Administered 2016-06-08: 3.5 mg/h via INTRAVENOUS
  Administered 2016-06-08: 8.5 mg/h via INTRAVENOUS
  Administered 2016-06-08: 13 mg/h via INTRAVENOUS
  Filled 2016-06-07 (×5): qty 100

## 2016-06-07 MED ORDER — FENTANYL CITRATE (PF) 100 MCG/2ML IJ SOLN: 25.0000 ug | INTRAMUSCULAR | Status: DC | PRN

## 2016-06-07 MED ORDER — WARFARIN SODIUM 1 MG PO TABS
4.0000 mg | ORAL_TABLET | Freq: Once | ORAL | Status: AC
  Administered 2016-06-07: 4 mg via ORAL
  Filled 2016-06-07: qty 4

## 2016-06-07 MED ORDER — ONDANSETRON HCL 4 MG/2ML IJ SOLN: 4.0000 mg | Freq: Once | INTRAMUSCULAR | Status: DC | PRN

## 2016-06-07 MED ORDER — ALPRAZOLAM 0.5 MG PO TABS
0.5000 mg | ORAL_TABLET | Freq: Two times a day (BID) | ORAL | Status: DC | PRN
  Administered 2016-06-07 – 2016-06-09 (×3): 0.5 mg via ORAL
  Filled 2016-06-07 (×3): qty 1

## 2016-06-07 MED ORDER — FERROUS SULFATE 325 (65 FE) MG PO TABS
325.0000 mg | ORAL_TABLET | Freq: Every day | ORAL | Status: DC
  Administered 2016-06-07 – 2016-06-10 (×4): 325 mg via ORAL
  Filled 2016-06-07 (×4): qty 1

## 2016-06-07 MED ORDER — METHYLPREDNISOLONE SODIUM SUCC 125 MG IJ SOLR
125.0000 mg | Freq: Once | INTRAMUSCULAR | Status: AC
  Administered 2016-06-07: 125 mg via INTRAVENOUS
  Filled 2016-06-07: qty 2

## 2016-06-07 MED ORDER — ASPIRIN EC 81 MG PO TBEC
81.0000 mg | DELAYED_RELEASE_TABLET | Freq: Every day | ORAL | Status: DC
  Administered 2016-06-07 – 2016-06-10 (×4): 81 mg via ORAL
  Filled 2016-06-07 (×4): qty 1

## 2016-06-07 MED ORDER — ASPIRIN 81 MG PO CHEW
324.0000 mg | CHEWABLE_TABLET | ORAL | Status: DC
Start: 2016-06-07 — End: 2016-06-07

## 2016-06-07 MED ORDER — WARFARIN SODIUM 3 MG PO TABS: 3.0000 mg | ORAL_TABLET | ORAL | Status: DC

## 2016-06-07 NOTE — H&P (Signed)
PULMONARY / CRITICAL CARE MEDICINE   Name: Adriana Martin MRN: [id number] DOB: [birth year]    ADMISSION DATE:  06/07/2016  CHIEF COMPLAINT:  Acute resp failure   HISTORY OF PRESENT ILLNESS:   70 yo white female admitted to ICU for acute resp failure patient placed on biPAP, limeted ROS  Patient with h/o COPD sees Dr. Raul Del Patient with h/o CHF atrial flutter Patient s/p cardioversion recently  Patient with increased WOB, high risk for intubation,cardiac arrest   PAST MEDICAL HISTORY :   has a past medical history of Anemia; Anxiety; Atrial fibrillation ([place]); B12 deficiency; Breast cancer ([place]); Breast cancer ([place]); CHF (congestive heart failure) ([place]); Chronic kidney disease; Cirrhosis ([place]); COPD (chronic obstructive pulmonary disease) ([place]); Depression; GERD (gastroesophageal reflux disease); Headache; Heart murmur; Hyperlipemia; Hyperlipidemia; Hypertension; Iron (Fe) deficiency anemia; Shortness of breath dyspnea; Thrombocytopenia ([place]); and Vitamin D deficiency.  has a past surgical history that includes Eye surgery; Cardiac surgery; mvp; Mastectomy (01/2011); Breast surgery; Ablation; Abdominal hysterectomy; Cardiac catheterization (N/A, 01/23/2015); Colonoscopy (2012); Esophagogastroduodenoscopy (2012); Appendectomy; Breast lumpectomy; Mitral valve replacement; VSD repair; and Cardiac catheterization (N/A, 05/27/2016). Prior to Admission medications   Medication Sig Start Date End Date Taking? Authorizing Provider  ADVAIR DISKUS 100-50 MCG/DOSE AEPB Inhale 1 puff into the lungs 2 (two) times daily.  10/29/14  Yes Historical Provider, MD  alendronate (FOSAMAX) 70 MG tablet Take 70 mg by mouth once a week. Take with a full glass of water on an empty stomach. *Take on Sunday*   Yes Historical Provider, MD  ALPRAZolam Duanne Moron) 0.5 MG tablet Take 0.5 mg by mouth 2 (two) times daily as needed for anxiety.   Yes Historical Provider, MD  amiodarone (PACERONE) 200 MG tablet Take 1 tablet (200  mg total) by mouth daily. Patient taking differently: Take 200 mg by mouth 2 (two) times daily.  03/24/16  Yes Fritzi Mandes, MD  anastrozole (ARIMIDEX) 1 MG tablet Take 1 tablet (1 mg total) by mouth daily. 03/18/16  Yes Cammie Sickle, MD  aspirin EC 81 MG tablet Take 81 mg by mouth daily.   Yes Historical Provider, MD  diltiazem (CARDIZEM SR) 60 MG 12 hr capsule Take 1 capsule (60 mg total) by mouth every 12 (twelve) hours. 03/24/16  Yes Fritzi Mandes, MD  Ferrous Sulfate 134 MG TABS Take 1 tablet by mouth 2 (two) times daily.   Yes Historical Provider, MD  furosemide (LASIX) 40 MG tablet Take 40 mg by mouth daily.  04/03/16  Yes Historical Provider, MD  guaiFENesin (MUCINEX) 600 MG 12 hr tablet Take 1 tablet (600 mg total) by mouth 2 (two) times daily. Patient not taking: Reported on 04/10/2016 03/24/16   Fritzi Mandes, MD  hydroxyurea (HYDREA) 500 MG capsule TAKE 1 CAPSULE BY MOUTH 6 TIMES A WEEK 06/02/16   Cammie Sickle, MD  levothyroxine (SYNTHROID, LEVOTHROID) 50 MCG tablet Take 50 mcg by mouth daily before breakfast.    Historical Provider, MD  lisinopril (PRINIVIL,ZESTRIL) 5 MG tablet Take 5 mg by mouth daily.  10/07/15   Historical Provider, MD  metoprolol succinate (TOPROL-XL) 50 MG 24 hr tablet Take 50 mg by mouth 2 (two) times daily. Take with or immediately following a meal.    Historical Provider, MD  Multiple Vitamins-Minerals (CENTRUM SILVER ULTRA WOMENS) TABS Take 1 tablet by mouth daily.    Historical Provider, MD  potassium chloride SA (K-DUR,KLOR-CON) 20 MEQ tablet take 1 capsule by mouth daily 04/03/16   Historical Provider, MD  pravastatin (PRAVACHOL) 20 MG  tablet Take 20 mg by mouth at bedtime.    Historical Provider, MD  predniSONE (DELTASONE) 10 MG tablet Take 50 mg daily taper by 10 mg daily then cont your daily 5 mg thereafter Patient not taking: Reported on 04/10/2016 03/24/16   Fritzi Mandes, MD  predniSONE (DELTASONE) 5 MG tablet Take 5 mg by mouth daily with breakfast.     Historical Provider, MD  PROAIR HFA 108 (90 BASE) MCG/ACT inhaler Inhale 2 puffs into the lungs every 4 (four) hours as needed for wheezing or shortness of breath.  10/29/14   Historical Provider, MD  Probiotic Product ([place]) CAPS Take 1 capsule by mouth daily.    Historical Provider, MD  ursodiol (ACTIGALL) 300 MG capsule Take 300 mg by mouth 4 (four) times daily.     Historical Provider, MD  Vitamin D, Ergocalciferol, (DRISDOL) 50000 units CAPS capsule Take 50,000 Units by mouth every 14 (fourteen) days.  07/31/15   Historical Provider, MD  warfarin (COUMADIN) 2 MG tablet Take 2-4 mg by mouth See admin instructions. Take 1 tablet by mouth every other day, alternating with 2 tablets (4 mg) by mouth every other day. Patient not sure of what days take the 2mg  or 4mg  on 04/01/15   Historical Provider, MD   Allergies  Allergen Reactions  . Atorvastatin Shortness Of Breath    Other reaction(s): Unknown  . Calcium Carbonate     Other reaction(s): Unknown  . Fluoxetine     Other reaction(s): Unknown  . Hydrochlorothiazide     Other reaction(s): Unknown    FAMILY HISTORY:  indicated that her mother is deceased. She indicated that her father is deceased. She indicated that her sister is alive. She indicated that her brother is deceased. She indicated that her daughter is alive.   SOCIAL HISTORY:  reports that she quit smoking about 17 years ago. Her smoking use included Cigarettes. She has a 7.50 pack-year smoking history. She has never used smokeless tobacco. She reports that she drinks about 2.4 oz of alcohol per week . She reports that she does not use drugs.  REVIEW OF SYSTEMS:  Limited due to critical illness   VITAL SIGNS: Temp:  [97.7 F (36.5 C)-98.2 F (36.8 C)] 97.7 F (36.5 C) (09/17 0856) Pulse Rate:  [113-142] 142 (09/17 0856) Resp:  [14-33] 20 (09/17 0856) BP: (112-159)/(72-116) 134/89 (09/17 0856) SpO2:  [92 %-100 %] 99 % (09/17 0856) Weight:  [187 lb (84.8  kg)-192 lb 3.9 oz (87.2 kg)] 192 lb 3.9 oz (87.2 kg) (09/17 0856) HEMODYNAMICS:     INTAKE / OUTPUT: No intake or output data in the 24 hours ending 06/07/16 0949  PHYSICAL EXAMINATION: Physical Examination:   GENERAL:critically ill appearing, +resp distress HEAD: Normocephalic, atraumatic.  EYES: Pupils equal, round, reactive to light.  No scleral icterus.  MOUTH: Moist mucosal membrane. NECK: Supple. No thyromegaly. + JVD. PULMONARY: Diffuse coarse rhonchi right sided +wheezes +rhonchi CARDIOVASCULAR: S1 and S2. irregular rate and rhythm. GASTROINTESTINAL: Soft, nontender, +distended. No masses. Positive bowel sounds. No hepatosplenomegaly.  MUSCULOSKELETAL: No swelling, clubbing, or edema.  NEUROLOGIC: lethargic but arousable SKIN:intact,warm,dry   LABS:  CBC  Recent Labs Lab 06/07/16 0223  WBC 12.7*  HGB 12.3  HCT 36.1  PLT 282   Coag's  Recent Labs Lab 06/07/16 0223  INR 2.77   BMET  Recent Labs Lab 06/07/16 0223  NA 136  K 3.9  CL 99*  CO2 25  BUN 15  CREATININE 1.10*  GLUCOSE 93  Electrolytes  Recent Labs Lab 06/07/16 0223  CALCIUM 9.1   Sepsis Markers No results for input(s): LATICACIDVEN, PROCALCITON, O2SATVEN in the last 168 hours. ABG No results for input(s): PHART, PCO2ART, PO2ART in the last 168 hours. Liver Enzymes No results for input(s): AST, ALT, ALKPHOS, BILITOT, ALBUMIN in the last 168 hours. Cardiac Enzymes  Recent Labs Lab 06/07/16 0223 06/07/16 0735  TROPONINI <0.03 0.03*   Glucose  Recent Labs Lab 06/07/16 0857  GLUCAP 171*    Imaging Dg Chest 2 View  Result Date: 06/07/2016 CLINICAL DATA:  Cough and shortness of breath for 1 day. EXAM: CHEST  2 VIEW COMPARISON:  Radiograph 03/22/2016 FINDINGS: Stable cardiomegaly with prosthetic valve. Linear scarring in the left lower lung zone. Questionable small right pleural effusion with blunting of the costophrenic angle and fluid in the fissures. There is mild  vascular congestion. No focal airspace disease. Calcified granuloma in the right lung. Surgical clips project over the right hilum. IMPRESSION: Suspect mild fluid overload with vascular congestion, fluid in the fissures and possible small effusions. Cardiomegaly is stable. No focal airspace disease to suggest pneumonia. Electronically Signed   By: Jeb Levering M.D.   On: 06/07/2016 03:12     ASSESSMENT / PLAN:   70 yo white female with acute resp failure on biPAP from acute CHF exacerbation and acute COPD exacerbation   PULMONARY High risk for intubation Wean off biPAP as tolerated Oxygen as needed   CARDIOVASCULAR afib needs ICU monitoring  RENAL Follow chem 7 -follow UP -lasix as tolerated  GASTROINTESTINAL Keep NPO  HEMATOLOGIC Follow CBC  INFECTIOUS No needs for ABX   I have personally obtained a history, examined the patient, evaluated Pertinent laboratory and RadioGraphic/imaging results, and  formulated the assessment and plan   The Patient requires high complexity decision making for assessment and support, frequent evaluation and titration of therapies, application of advanced monitoring technologies and extensive interpretation of multiple databases. Critical Care Time devoted to patient care services described in this note is 45 minutes.   Overall, patient is critically ill, prognosis is guarded.  Patient with Multiorgan failure and at high risk for cardiac arrest and death.    Corrin Parker, M.D.  [facility]  Medical Director [place] Director [facility] Cardio-Pulmonary Department

## 2016-06-07 NOTE — Progress Notes (Signed)
[place] for warfarin dosing Indication: atrial fibrillation  Allergies  Allergen Reactions  . Atorvastatin Shortness Of Breath    Other reaction(s): Unknown  . Calcium Carbonate     Other reaction(s): Unknown  . Fluoxetine     Other reaction(s): Unknown  . Hydrochlorothiazide     Other reaction(s): Unknown    Patient Measurements: Height: 5\' 2"  (157.5 cm) Weight: 192 lb 3.9 oz (87.2 kg) IBW/kg (Calculated) : 50.1  Vital Signs: Temp: 97.7 F (36.5 C) (09/17 1200) Temp Source: Oral (09/17 1200) BP: 133/114 (09/17 1200) Pulse Rate: 100 (09/17 1200)  Labs:  Recent Labs  06/07/16 0223 06/07/16 0735  HGB 12.3  --   HCT 36.1  --   PLT 282  --   LABPROT 29.8*  --   INR 2.77  --   CREATININE 1.10*  --   TROPONINI <0.03 0.03*    Estimated Creatinine Clearance: 48.1 mL/min (by C-G formula based on SCr of 1.1 mg/dL (H)).   Medical History: Past Medical History:  Diagnosis Date  . Anemia   . Anxiety   . Atrial fibrillation ([place])   . B12 deficiency   . Breast cancer ([place])    RT Lumpectomy c radiation 2012  . Breast cancer ([place])   . CHF (congestive heart failure) ([place])   . Chronic kidney disease   . Cirrhosis ([place])   . COPD (chronic obstructive pulmonary disease) ([place])   . Depression   . GERD (gastroesophageal reflux disease)   . Headache   . Heart murmur   . Hyperlipemia   . Hyperlipidemia   . Hypertension   . Iron (Fe) deficiency anemia   . Shortness of breath dyspnea   . Thrombocytopenia ([place])   . Vitamin D deficiency     Medications:  Scheduled:  . anastrozole  1 mg Oral Daily  . aspirin EC  81 mg Oral Daily  . budesonide (PULMICORT) nebulizer solution  0.25 mg Nebulization Q12H  . ferrous sulfate  325 mg Oral Q breakfast  . furosemide  40 mg Intravenous Daily  . hydroxyurea  500 mg Oral Daily  . ipratropium-albuterol  3 mL Nebulization Q6H  . levothyroxine  50 mcg Oral QAC breakfast  . lisinopril  5 mg  Oral Daily  . metoprolol succinate  50 mg Oral BID  . pravastatin  20 mg Oral QHS  . predniSONE  5 mg Oral Q breakfast  . ursodiol  300 mg Oral QID  . warfarin  4 mg Oral ONCE-1800  . Warfarin - Pharmacist Dosing Inpatient   Does not apply q1800   Infusions:  . diltiazem (CARDIZEM) infusion 12.5 mg/hr (06/07/16 1200)    Goal of Therapy:  INR 2-3 Monitor platelets by anticoagulation protocol: Yes   Assessment/Plan:  Pharmacy has been consulted for warfarin dosing in this 21 yoF with afib. Pt reports rotating warfarin 2 mg and 4 mg every other day and took 2 mg last night (9/16). INR therapeutic today (2.77). Will continue home dose and monitoring CBC, INR and interacting meds.  Date  Dose  AM INR 9/17  4 mg     Darrow Bussing, PharmD Pharmacy Resident 06/07/2016 12:35 PM

## 2016-06-07 NOTE — ED Notes (Signed)
IV attempts x2,unsuccessful.

## 2016-06-07 NOTE — ED Notes (Signed)
Attempted to call report, RN unable to take report at this time, will call me back

## 2016-06-07 NOTE — Progress Notes (Signed)
Pt being transferred to room 238. Report called to [place]. Pt and belongings transferred to room 238 without incident.

## 2016-06-07 NOTE — Consult Note (Signed)
[facility] Cardiology  CARDIOLOGY CONSULT NOTE  Patient ID: Adriana Martin MRN: [id number] DOB/AGE: Jul 08, 1946 70 y.o.  Admit date: 06/07/2016 Referring Physician [place] Primary Physician [facility] Primary Cardiologist Nehemiah Massed Reason for Consultation Atrial fibrillation  HPI: 70 year old female referred for evaluation of atrial fibrillation and congestive heart failure. The patient has known history of paroxysmal atrial fibrillation, status post catheter ablation 2016, recent cardioversion, reverted back to atrial fibrillation, intolerant of amiodarone, being considered for repeat catheter ablation versus AV nodal ablation. The patient is also status post mitral valve replacement, with bioprosthetic valve, 2015. The patient has known history of COPD, on multiple inhalers. She presents to [facility] emergency room with 2 day history of intermittent cough, malaise, myalgias, and shortness of breath. Chest x-ray revealed mild vascular congestion. The patient currently is atrial fibrillation with a ventricular rate of 9210 bpm.  Review of systems complete and found to be negative unless listed above     Past Medical History:  Diagnosis Date  . Anemia   . Anxiety   . Atrial fibrillation ([place])   . B12 deficiency   . Breast cancer ([place])    RT Lumpectomy c radiation 2012  . Breast cancer ([place])   . CHF (congestive heart failure) ([place])   . Chronic kidney disease   . Cirrhosis ([place])   . COPD (chronic obstructive pulmonary disease) ([place])   . Depression   . GERD (gastroesophageal reflux disease)   . Headache   . Heart murmur   . Hyperlipemia   . Hyperlipidemia   . Hypertension   . Iron (Fe) deficiency anemia   . Shortness of breath dyspnea   . Thrombocytopenia ([place])   . Vitamin D deficiency     Past Surgical History:  Procedure Laterality Date  . ABDOMINAL HYSTERECTOMY     Partial  . ABLATION    . APPENDECTOMY    . BREAST LUMPECTOMY    . BREAST SURGERY    . CARDIAC SURGERY    . COLONOSCOPY  2012   . ELECTROPHYSIOLOGIC STUDY N/A 01/23/2015   Procedure: CARDIOVERSION;  Surgeon: Corey Skains, MD;  Location: [facility] ORS;  Service: Cardiovascular;  Laterality: N/A;  . ELECTROPHYSIOLOGIC STUDY N/A 05/27/2016   Procedure: CARDIOVERSION;  Surgeon: Corey Skains, MD;  Location: [facility] ORS;  Service: Cardiovascular;  Laterality: N/A;  . ESOPHAGOGASTRODUODENOSCOPY  2012  . EYE SURGERY    . MASTECTOMY  01/2011  . MITRAL VALVE REPLACEMENT    . mvp    . VSD REPAIR      Prescriptions Prior to Admission  Medication Sig Dispense Refill Last Dose  . ADVAIR DISKUS 100-50 MCG/DOSE AEPB Inhale 1 puff into the lungs 2 (two) times daily.   0 06/06/2016 at Unknown time  . alendronate (FOSAMAX) 70 MG tablet Take 70 mg by mouth once a week. Take with a full glass of water on an empty stomach. *Take on Sunday*   06/06/2016 at Unknown time  . ALPRAZolam (XANAX) 0.5 MG tablet Take 0.5 mg by mouth 2 (two) times daily as needed for anxiety.   06/06/2016 at Unknown time  . amiodarone (PACERONE) 200 MG tablet Take 1 tablet (200 mg total) by mouth daily. (Patient taking differently: Take 200 mg by mouth 2 (two) times daily. ) 30 tablet 0 06/06/2016 at Unknown time  . anastrozole (ARIMIDEX) 1 MG tablet Take 1 tablet (1 mg total) by mouth daily. 30 tablet 6 06/06/2016 at Unknown time  . aspirin EC 81 MG tablet Take 81 mg by mouth  daily.   06/06/2016 at Unknown time  . diltiazem (CARDIZEM SR) 60 MG 12 hr capsule Take 1 capsule (60 mg total) by mouth every 12 (twelve) hours. 60 capsule 0 06/06/2016 at Unknown time  . Ferrous Sulfate 134 MG TABS Take 1 tablet by mouth 2 (two) times daily.   06/06/2016 at Unknown time  . furosemide (LASIX) 40 MG tablet Take 40 mg by mouth daily.   1 06/06/2016 at Unknown time  . guaiFENesin (MUCINEX) 600 MG 12 hr tablet Take 1 tablet (600 mg total) by mouth 2 (two) times daily. (Patient not taking: Reported on 04/10/2016) 15 tablet 0 Not Taking at Unknown time  . hydroxyurea (HYDREA) 500 MG capsule TAKE  1 CAPSULE BY MOUTH 6 TIMES A WEEK 30 capsule 6   . levothyroxine (SYNTHROID, LEVOTHROID) 50 MCG tablet Take 50 mcg by mouth daily before breakfast.   05/26/2016 at Unknown time  . lisinopril (PRINIVIL,ZESTRIL) 5 MG tablet Take 5 mg by mouth daily.   98 04/16/2016 at Unknown time  . metoprolol succinate (TOPROL-XL) 50 MG 24 hr tablet Take 50 mg by mouth 2 (two) times daily. Take with or immediately following a meal.   05/27/2016 at Unknown time  . Multiple Vitamins-Minerals (CENTRUM SILVER ULTRA WOMENS) TABS Take 1 tablet by mouth daily.   04/15/2016 at Unknown time  . potassium chloride SA (K-DUR,KLOR-CON) 20 MEQ tablet take 1 capsule by mouth daily  1 05/27/2016 at Unknown time  . pravastatin (PRAVACHOL) 20 MG tablet Take 20 mg by mouth at bedtime.   05/27/2016 at Unknown time  . predniSONE (DELTASONE) 10 MG tablet Take 50 mg daily taper by 10 mg daily then cont your daily 5 mg thereafter (Patient not taking: Reported on 04/10/2016) 15 tablet 0 Not Taking at Unknown time  . predniSONE (DELTASONE) 5 MG tablet Take 5 mg by mouth daily with breakfast.   05/27/2016 at Unknown time  . PROAIR HFA 108 (90 BASE) MCG/ACT inhaler Inhale 2 puffs into the lungs every 4 (four) hours as needed for wheezing or shortness of breath.   0 05/26/2016 at Unknown time  . Probiotic Product ([place]) CAPS Take 1 capsule by mouth daily.   04/15/2016 at Unknown time  . ursodiol (ACTIGALL) 300 MG capsule Take 300 mg by mouth 4 (four) times daily.    05/27/2016 at Unknown time  . Vitamin D, Ergocalciferol, (DRISDOL) 50000 units CAPS capsule Take 50,000 Units by mouth every 14 (fourteen) days.    05/26/2016 at Unknown time  . warfarin (COUMADIN) 2 MG tablet Take 2-4 mg by mouth See admin instructions. Take 1 tablet by mouth every other day, alternating with 2 tablets (4 mg) by mouth every other day. Patient not sure of what days take the 2mg  or 4mg  on  5 05/26/2016 at Unknown time   Social History   Social History  . Marital status:  Divorced    Spouse name: N/A  . Number of children: N/A  . Years of education: N/A   Occupational History  . Not on file.   Social History Main Topics  . Smoking status: Former Smoker    Packs/day: 0.25    Years: 30.00    Types: Cigarettes    Quit date: 10/07/1998  . Smokeless tobacco: Never Used  . Alcohol use 2.4 oz/week    4 Cans of beer per week     Comment: socially  . Drug use: No  . Sexual activity: Not on file   Other Topics  Concern  . Not on file   Social History Narrative  . No narrative on file    Family History  Problem Relation Age of Onset  . Cancer Mother   . Breast cancer Mother   . Cancer Father   . Cancer Brother   . Cancer Daughter   . Breast cancer Daughter       Review of systems complete and found to be negative unless listed above      PHYSICAL EXAM  General: Well developed, well nourished, in no acute distress HEENT:  Normocephalic and atramatic Neck:  No JVD.  Lungs: Clear bilaterally to auscultation and percussion. Heart: HRRR . Normal S1 and S2 without gallops or murmurs.  Abdomen: Bowel sounds are positive, abdomen soft and non-tender  Msk:  Back normal, normal gait. Normal strength and tone for age. Extremities: No clubbing, cyanosis or edema.   Neuro: Alert and oriented X 3. Psych:  Good affect, responds appropriately  Labs:   Lab Results  Component Value Date   WBC 12.7 (H) 06/07/2016   HGB 12.3 06/07/2016   HCT 36.1 06/07/2016   MCV 101.9 (H) 06/07/2016   PLT 282 06/07/2016    Recent Labs Lab 06/07/16 0223  NA 136  K 3.9  CL 99*  CO2 25  BUN 15  CREATININE 1.10*  CALCIUM 9.1  GLUCOSE 93   Lab Results  Component Value Date   CKTOTAL 62 06/10/2013   CKMB < 0.5 (L) 06/10/2013   TROPONINI 0.03 (HH) 06/07/2016    Lab Results  Component Value Date   CHOL 276 (H) 08/20/2013   CHOL 225 (H) 03/21/2013   Lab Results  Component Value Date   HDL 81 (H) 08/20/2013   HDL 102 (H) 03/21/2013   Lab Results   Component Value Date   LDLCALC 171 (H) 08/20/2013   LDLCALC 111 (H) 03/21/2013   Lab Results  Component Value Date   TRIG 121 08/20/2013   TRIG 60 03/21/2013   No results found for: CHOLHDL No results found for: LDLDIRECT    Radiology: Dg Chest 2 View  Result Date: 06/07/2016 CLINICAL DATA:  Cough and shortness of breath for 1 day. EXAM: CHEST  2 VIEW COMPARISON:  Radiograph 03/22/2016 FINDINGS: Stable cardiomegaly with prosthetic valve. Linear scarring in the left lower lung zone. Questionable small right pleural effusion with blunting of the costophrenic angle and fluid in the fissures. There is mild vascular congestion. No focal airspace disease. Calcified granuloma in the right lung. Surgical clips project over the right hilum. IMPRESSION: Suspect mild fluid overload with vascular congestion, fluid in the fissures and possible small effusions. Cardiomegaly is stable. No focal airspace disease to suggest pneumonia. Electronically Signed   By: Jeb Levering M.D.   On: 06/07/2016 03:12    EKG: Atrial fibrillation  ASSESSMENT AND PLAN:   1. Atrial fibrillation, failed catheter ablation and electrical cardioversion, rate recently controlled on diltiazem drip 2. Mild acute diastolic congestive heart failure 3. COPD exacerbation  Recommendations  1. Agree with current therapy 2. Continue warfarin for stroke prevention 3. Convert Cardizem drip to diltiazem SR 90 mg every 12 4. Continue metoprolol succinate 50 mg twice a day 5. Continue diuresis   Signed: Kenneisha Cochrane MD,PhD, [facility] 06/07/2016, 9:57 AM

## 2016-06-07 NOTE — ED Notes (Signed)
Pt presents to the ED for chills, body aches, and SHOB. Pt states this all started on Friday night, 06/05/16, and went away. Pt states this all came back this morning.

## 2016-06-07 NOTE — ED Provider Notes (Signed)
[facility] Emergency Department Provider Note   ____________________________________________   First MD Initiated Contact with Patient 06/07/16 [phone]     (approximate)  I have reviewed the triage vital signs and the nursing notes.   HISTORY  Chief Complaint Cough; Shortness of Breath; and Emesis    HPI [facility] is a 70 y.o. female comes into the hospital today with a cough, shortness of breath and achiness. She reports that the symptoms started on Friday. It eased off on Saturday but then came back last night. She is unsure she's had any fever but denies any sick contacts. She has an occasionally productive cough but denies chest pain or headache and dizziness. She has been nauseous with some posttussive emesis. The patient had a cardioversion last week for atrial flutter. They report though that when she went back to her doctor's office she Martin back in atrial flutter. She sees Dr. Nehemiah Massed. She has a history of a mitral valve replacement and is on a blood thinner. The patient reports she feels that she can't catch her breath which is what prompted her to come into the hospital tonight.   Past Medical History:  Diagnosis Date  . Anemia   . Anxiety   . Atrial fibrillation ([place])   . B12 deficiency   . Breast cancer ([place])    RT Lumpectomy c radiation 2012  . Breast cancer ([place])   . CHF (congestive heart failure) ([place])   . Chronic kidney disease   . Cirrhosis ([place])   . COPD (chronic obstructive pulmonary disease) ([place])   . Depression   . GERD (gastroesophageal reflux disease)   . Headache   . Heart murmur   . Hyperlipemia   . Hyperlipidemia   . Hypertension   . Iron (Fe) deficiency anemia   . Shortness of breath dyspnea   . Thrombocytopenia ([place])   . Vitamin D deficiency     Patient Active Problem List   Diagnosis Date Noted  . A-fib ([place]) 04/08/2016  . COPD exacerbation ([place]) 03/22/2016  . Acute bronchitis 03/22/2016  . Atrial  fibrillation with RVR ([place]) 03/22/2016  . Chronic renal insufficiency 03/22/2016  . Cough with expectoration 06/04/2015  . Breast cancer ([place]) 04/02/2015  . Thrombocytosis ([place]) 02/01/2015  . Anemia 02/01/2015  . Atrial flutter ([place]) 01/23/2015  . Atrial fibrillation ([place]) 01/23/2015  . Other long term (current) drug therapy 11/06/2014  . Chronic diastolic heart failure ([place]) 08/01/2014  . Long term current use of anticoagulant 06/06/2014  . Anxiety 05/13/2014  . Chronic obstructive pulmonary disease ([place]) 03/22/2014  . 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 Avitaminosis D 02/19/2014    Past Surgical History:  Procedure Laterality Date  . ABDOMINAL HYSTERECTOMY     Partial  . ABLATION    . APPENDECTOMY    . BREAST LUMPECTOMY    . BREAST SURGERY    . CARDIAC SURGERY    . COLONOSCOPY  2012  . ELECTROPHYSIOLOGIC STUDY N/A 01/23/2015   Procedure: CARDIOVERSION;  Surgeon: Corey Skains, MD;  Location: [facility] ORS;  Service: Cardiovascular;  Laterality: N/A;  . ELECTROPHYSIOLOGIC STUDY N/A 05/27/2016   Procedure: CARDIOVERSION;  Surgeon: Corey Skains, MD;  Location: [facility] ORS;  Service: Cardiovascular;  Laterality: N/A;  . ESOPHAGOGASTRODUODENOSCOPY  2012  . EYE SURGERY    . MASTECTOMY  01/2011  . MITRAL VALVE REPLACEMENT    . mvp    . VSD REPAIR      Prior to Admission medications  Medication Sig Start Date End Date Taking? Authorizing Provider  ADVAIR DISKUS 100-50 MCG/DOSE AEPB Inhale 1 puff into the lungs 2 (two) times daily.  10/29/14   Historical Provider, MD  alendronate (FOSAMAX) 70 MG tablet Take 70 mg by mouth once a week. Take with a full glass of water on an empty stomach. *Take on Sunday*    Historical Provider, MD  ALPRAZolam Duanne Moron) 0.5 MG tablet Take 0.5 mg by mouth 2 (two) times daily as needed for anxiety.    Historical Provider, MD  amiodarone (PACERONE) 200 MG tablet Take 1 tablet (200 mg total) by mouth daily. Patient taking differently: Take 200 mg  by mouth 2 (two) times daily.  03/24/16   Fritzi Mandes, MD  anastrozole (ARIMIDEX) 1 MG tablet Take 1 tablet (1 mg total) by mouth daily. 03/18/16   Cammie Sickle, MD  aspirin EC 81 MG tablet Take 81 mg by mouth daily.    Historical Provider, MD  diltiazem (CARDIZEM SR) 60 MG 12 hr capsule Take 1 capsule (60 mg total) by mouth every 12 (twelve) hours. Patient not taking: Reported on 05/26/2016 03/24/16   Fritzi Mandes, MD  Ferrous Sulfate 134 MG TABS Take 1 tablet by mouth 2 (two) times daily.    Historical Provider, MD  furosemide (LASIX) 40 MG tablet Take 40 mg by mouth daily.  04/03/16   Historical Provider, MD  guaiFENesin (MUCINEX) 600 MG 12 hr tablet Take 1 tablet (600 mg total) by mouth 2 (two) times daily. Patient not taking: Reported on 04/10/2016 03/24/16   Fritzi Mandes, MD  hydroxyurea (HYDREA) 500 MG capsule TAKE 1 CAPSULE BY MOUTH 6 TIMES A WEEK 06/02/16   Cammie Sickle, MD  levothyroxine (SYNTHROID, LEVOTHROID) 50 MCG tablet Take 50 mcg by mouth daily before breakfast.    Historical Provider, MD  lisinopril (PRINIVIL,ZESTRIL) 5 MG tablet Take 5 mg by mouth daily.  10/07/15   Historical Provider, MD  metoprolol succinate (TOPROL-XL) 50 MG 24 hr tablet Take 50 mg by mouth 2 (two) times daily. Take with or immediately following a meal.    Historical Provider, MD  Multiple Vitamins-Minerals (CENTRUM SILVER ULTRA WOMENS) TABS Take 1 tablet by mouth daily.    Historical Provider, MD  potassium chloride SA (K-DUR,KLOR-CON) 20 MEQ tablet take 1 capsule by mouth daily 04/03/16   Historical Provider, MD  pravastatin (PRAVACHOL) 20 MG tablet Take 20 mg by mouth at bedtime.    Historical Provider, MD  predniSONE (DELTASONE) 10 MG tablet Take 50 mg daily taper by 10 mg daily then cont your daily 5 mg thereafter Patient not taking: Reported on 04/10/2016 03/24/16   Fritzi Mandes, MD  predniSONE (DELTASONE) 5 MG tablet Take 5 mg by mouth daily with breakfast.    Historical Provider, MD  PROAIR HFA 108 (90 BASE)  MCG/ACT inhaler Inhale 2 puffs into the lungs every 4 (four) hours as needed for wheezing or shortness of breath.  10/29/14   Historical Provider, MD  Probiotic Product ([place]) CAPS Take 1 capsule by mouth daily.    Historical Provider, MD  ursodiol (ACTIGALL) 300 MG capsule Take 300 mg by mouth 4 (four) times daily.     Historical Provider, MD  Vitamin D, Ergocalciferol, (DRISDOL) 50000 units CAPS capsule Take 50,000 Units by mouth every 14 (fourteen) days.  07/31/15   Historical Provider, MD  warfarin (COUMADIN) 2 MG tablet Take 2-4 mg by mouth See admin instructions. Take 1 tablet by mouth every other day, alternating  with 2 tablets (4 mg) by mouth every other day. Patient not sure of what days take the 2mg  or 4mg  on 04/01/15   Historical Provider, MD    Allergies Atorvastatin; Calcium carbonate; Fluoxetine; and Hydrochlorothiazide  Family History  Problem Relation Age of Onset  . Cancer Mother   . Breast cancer Mother   . Cancer Father   . Cancer Brother   . Cancer Daughter   . Breast cancer Daughter     Social History Social History  Substance Use Topics  . Smoking status: Former Smoker    Packs/day: 0.25    Years: 30.00    Types: Cigarettes    Quit date: 10/07/1998  . Smokeless tobacco: Never Used  . Alcohol use 2.4 oz/week    4 Cans of beer per week     Comment: socially    Review of Systems Constitutional: No fever/chills Eyes: No visual changes. ENT: No sore throat. Cardiovascular: Denies chest pain. Respiratory: cough and shortness of breath. Gastrointestinal: nausea No abdominal pain.  No nausea, no vomiting.  No diarrhea.  No constipation. Genitourinary: Negative for dysuria. Musculoskeletal: Negative for back pain. Skin: Negative for rash. Neurological: Negative for headaches, focal weakness or numbness.  10-point ROS otherwise negative.  ____________________________________________   PHYSICAL EXAM:  VITAL SIGNS: ED Triage Vitals  Enc  Vitals Group     BP 06/07/16 0143 (!) 134/102     Pulse Rate 06/07/16 0143 (!) 125     Resp 06/07/16 0200 (!) 23     Temp 06/07/16 0143 98.2 F (36.8 C)     Temp Source 06/07/16 0143 Oral     SpO2 06/07/16 0143 97 %     Weight 06/07/16 0144 187 lb (84.8 kg)     Height 06/07/16 0144 5\' 2"  (1.575 m)     Head Circumference --      Peak Flow --      Pain Score 06/07/16 0144 4     Pain Loc --      Pain Edu? --      Excl. in [place]? --     Constitutional: Alert and oriented. Well appearing and in moderate distress. Eyes: Conjunctivae are normal. PERRL. EOMI. Head: Atraumatic. Nose: No congestion/rhinnorhea. Mouth/Throat: Mucous membranes are moist.  Oropharynx non-erythematous. Cardiovascular: tachycardia with irregular rhythm. Grossly normal heart sounds.  Good peripheral circulation. Respiratory: increased respiratory effort.  No retractions. Mild expiratory wheezes Gastrointestinal: Soft and nontender. No distention. Positive bowel sounds Musculoskeletal: No lower extremity tenderness nor edema.   Neurologic:  Normal speech and language.  Skin:  Skin is warm, dry and intact.  Psychiatric: Mood and affect are normal.   ____________________________________________   LABS (all labs ordered are listed, but only abnormal results are displayed)  Labs Reviewed  BASIC METABOLIC PANEL - Abnormal; Notable for the following:       Result Value   Chloride 99 (*)    Creatinine, Ser 1.10 (*)    GFR calc non Af Amer 49 (*)    GFR calc Af Amer 57 (*)    All other components within normal limits  CBC - Abnormal; Notable for the following:    WBC 12.7 (*)    RBC 3.54 (*)    MCV 101.9 (*)    MCH 34.8 (*)    RDW 16.3 (*)    All other components within normal limits  BRAIN NATRIURETIC PEPTIDE - Abnormal; Notable for the following:    B Natriuretic Peptide 676.0 (*)  All other components within normal limits  BLOOD GAS, VENOUS - Abnormal; Notable for the following:    pH, Ven 7.47 (*)     pCO2, Ven 38 (*)    Acid-Base Excess 3.8 (*)    All other components within normal limits  PROTIME-INR - Abnormal; Notable for the following:    Prothrombin Time 29.8 (*)    All other components within normal limits  TROPONIN I   ____________________________________________  EKG  ED ECG REPORT I, Loney Hering, the attending physician, personally viewed and interpreted this ECG.   Date: 06/07/2016  EKG Time: 141  Rate: 121  Rhythm: atrial fibrillation, rate 121  Axis: normal  Intervals:none  ST&T Change: normal  ____________________________________________  RADIOLOGY  CXR ____________________________________________   PROCEDURES  Procedure(s) performed: None  Procedures  Critical Care performed: No  ____________________________________________   INITIAL IMPRESSION / ASSESSMENT AND PLAN / ED COURSE  Pertinent labs & imaging results that were available during my care of the patient were reviewed by me and considered in my medical decision making (see chart for details).  This is a 70 year old female who comes into the hospital today with shortness of breath. The patient reports that this been going on for some days. She is unable to catch her breath and she looks that she is very uncomfortable. The patient did have some wheezing with a history of COPD so I initially thought the patient may have some COPD exacerbation. She did receive 2 DuoNeb treatments as well as Solu-Medrol. The blood work Martin unremarkable but I did perform a VBG which did not show CO2 retention. Her chest x-ray though did show some vascular congestion. The patient continued to remain tachycardic as well as have some continued tachypnea so the decision Martin made to place the patient on BiPAP. I did give the patient a dose of diltiazem 15 mg followed by another dose of 20 mg. The patient's heart rate did improve it did not remain lowered. I will give the patient a diltiazem drip and she will be  admitted to the intensive care unit. She is much improved on the BiPAP. I also will give the patient a dose of Lasix to help with her fluid overload. I spoke with Dr. Ashok Cordia who accepted the patient to the intensive care unit service.  Clinical Course  Value Comment By Time  DG Chest 2 View Suspect mild fluid overload with vascular congestion, fluid in the fissures and possible small effusions. Cardiomegaly is stable.  No focal airspace disease to suggest pneumonia.   Loney Hering, MD 09/17 [phone]     ____________________________________________   FINAL CLINICAL IMPRESSION(S) / ED DIAGNOSES  Final diagnoses:  Atrial fibrillation with rapid ventricular response (HCC)  Dyspnea  Shortness of breath  Acute pulmonary edema (HCC)      NEW MEDICATIONS STARTED DURING THIS VISIT:  New Prescriptions   No medications on file     Note:  This document Martin prepared using Dragon voice recognition software and may include unintentional dictation errors.    Loney Hering, MD 06/07/16 [phone]

## 2016-06-07 NOTE — ED Notes (Signed)
Respiratory called to some help transport patient to the floor. Per Iona Beard he will be here in about 10 minutes.

## 2016-06-08 ENCOUNTER — Inpatient Hospital Stay: Payer: Medicare Other

## 2016-06-08 LAB — PROTIME-INR
INR: 2.69
PROTHROMBIN TIME: 29.1 s — AB (ref 11.4–15.2)

## 2016-06-08 LAB — CBC
HEMATOCRIT: 34.1 % — AB (ref 35.0–47.0)
HEMOGLOBIN: 11.7 g/dL — AB (ref 12.0–16.0)
MCH: 35.3 pg — ABNORMAL HIGH (ref 26.0–34.0)
MCHC: 34.3 g/dL (ref 32.0–36.0)
MCV: 102.8 fL — AB (ref 80.0–100.0)
Platelets: 263 10*3/uL (ref 150–440)
RBC: 3.31 MIL/uL — AB (ref 3.80–5.20)
RDW: 16.1 % — ABNORMAL HIGH (ref 11.5–14.5)
WBC: 9.7 10*3/uL (ref 3.6–11.0)

## 2016-06-08 LAB — BASIC METABOLIC PANEL
ANION GAP: 10 (ref 5–15)
BUN: 24 mg/dL — ABNORMAL HIGH (ref 6–20)
CO2: 29 mmol/L (ref 22–32)
Calcium: 8.9 mg/dL (ref 8.9–10.3)
Chloride: 96 mmol/L — ABNORMAL LOW (ref 101–111)
Creatinine, Ser: 1.21 mg/dL — ABNORMAL HIGH (ref 0.44–1.00)
GFR calc non Af Amer: 44 mL/min — ABNORMAL LOW (ref >60)
GFR, EST AFRICAN AMERICAN: 51 mL/min — AB (ref >60)
Glucose, Bld: 137 mg/dL — ABNORMAL HIGH (ref 65–99)
POTASSIUM: 3.2 mmol/L — AB (ref 3.5–5.1)
Sodium: 135 mmol/L (ref 135–145)

## 2016-06-08 LAB — PHOSPHORUS: PHOSPHORUS: 3.4 mg/dL (ref 2.5–4.6)

## 2016-06-08 LAB — MAGNESIUM: Magnesium: 1.8 mg/dL (ref 1.7–2.4)

## 2016-06-08 MED ORDER — WARFARIN SODIUM 1 MG PO TABS
2.0000 mg | ORAL_TABLET | Freq: Once | ORAL | Status: AC
  Administered 2016-06-08: 2 mg via ORAL
  Filled 2016-06-08: qty 2

## 2016-06-08 MED ORDER — BUDESONIDE 0.5 MG/2ML IN SUSP
0.5000 mg | Freq: Two times a day (BID) | RESPIRATORY_TRACT | Status: DC
  Administered 2016-06-08 – 2016-06-10 (×4): 0.5 mg via RESPIRATORY_TRACT
  Filled 2016-06-08 (×4): qty 2

## 2016-06-08 MED ORDER — HYDROXYUREA 500 MG PO CAPS
500.0000 mg | ORAL_CAPSULE | ORAL | Status: DC
  Administered 2016-06-08 – 2016-06-10 (×3): 500 mg via ORAL
  Filled 2016-06-08 (×3): qty 1

## 2016-06-08 MED ORDER — IPRATROPIUM-ALBUTEROL 0.5-2.5 (3) MG/3ML IN SOLN: 3.0000 mL | RESPIRATORY_TRACT | Status: DC | PRN

## 2016-06-08 MED ORDER — POTASSIUM CHLORIDE CRYS ER 20 MEQ PO TBCR
40.0000 meq | EXTENDED_RELEASE_TABLET | Freq: Every day | ORAL | Status: DC
  Administered 2016-06-08 – 2016-06-09 (×2): 40 meq via ORAL
  Filled 2016-06-08 (×2): qty 2

## 2016-06-08 MED ORDER — DIPHENHYDRAMINE HCL 25 MG PO CAPS
25.0000 mg | ORAL_CAPSULE | Freq: Four times a day (QID) | ORAL | Status: DC | PRN
  Administered 2016-06-08: 25 mg via ORAL
  Filled 2016-06-08: qty 1

## 2016-06-08 MED ORDER — DILTIAZEM HCL ER 90 MG PO CP12
90.0000 mg | ORAL_CAPSULE | Freq: Two times a day (BID) | ORAL | Status: DC
  Administered 2016-06-08 – 2016-06-10 (×5): 90 mg via ORAL
  Filled 2016-06-08 (×7): qty 1

## 2016-06-08 NOTE — Progress Notes (Signed)
Metoprolol Xl and Cardizem  P.o given as ordered. Patient Cardizem drip taper down as ordered. Well tolerated, will continues to monitor.

## 2016-06-08 NOTE — Progress Notes (Signed)
Pt. Did have a productive cough with thick yellow sputum.

## 2016-06-08 NOTE — Progress Notes (Signed)
Dr. Leslye Peer made aware of patient blood pressure reading at 0755, new order to hold metoprolol and lisinopril and recheck blood pressure later. If high enough to give. Blood pressure recheck at 11.44 98/60. Medications held, not given. MD aware.

## 2016-06-08 NOTE — Progress Notes (Signed)
Patient ID: [facility], female   DOB: Mar 18, 1946, 70 y.o.   MRN: [id number]  [employer] PROGRESS NOTE  [facility] [id number] DOB: Oct 25, 1945 DOA: 06/07/2016 PCP: [name], MD  HPI/Subjective: Case picked up from critical care specialist team. Patient was short of breath on presentation requiring BiPAP. Her cardiologist has been battling the atrial fibrillation for a while with adjusting medications. Patient's main complaint was coughing up watery phlegm  Objective: Vitals:   06/08/16 1144 06/08/16 1432  BP: 98/60 103/71  Pulse: 92 94  Resp: 16   Temp: 97.6 F (36.4 C)     Filed Weights   06/07/16 0144 06/07/16 0856 06/08/16 0508  Weight: 84.8 kg (187 lb) 87.2 kg (192 lb 3.9 oz) 89.3 kg (196 lb 12.8 oz)    ROS: Review of Systems  Constitutional: Negative for chills and fever.  Eyes: Negative for blurred vision.  Respiratory: Positive for cough and shortness of breath.   Cardiovascular: Negative for chest pain.  Gastrointestinal: Negative for abdominal pain, constipation, diarrhea, nausea and vomiting.  Genitourinary: Negative for dysuria.  Musculoskeletal: Negative for joint pain.  Neurological: Negative for dizziness and headaches.   Exam: Physical Exam  Constitutional: She is oriented to person, place, and time.  HENT:  Nose: No mucosal edema.  Mouth/Throat: No oropharyngeal exudate or posterior oropharyngeal edema.  Eyes: Conjunctivae, EOM and lids are normal. Pupils are equal, round, and reactive to light.  Neck: No JVD present. Carotid bruit is not present. No edema present. No thyroid mass and no thyromegaly present.  Cardiovascular: S1 normal and S2 normal.  An irregularly irregular rhythm present. Exam reveals no gallop.   Murmur heard.  Systolic murmur is present with a grade of 2/6  Pulses:      Dorsalis pedis pulses are 2+ on the right side, and 2+ on the left side.  Respiratory: No respiratory distress. She has no wheezes. She has no  rhonchi. She has rales in the right lower field and the left lower field.  GI: Soft. Bowel sounds are normal. There is no tenderness.  Musculoskeletal:       Right ankle: She exhibits swelling.       Left ankle: She exhibits swelling.  Lymphadenopathy:    She has no cervical adenopathy.  Neurological: She is alert and oriented to person, place, and time. No cranial nerve deficit.  Skin: Skin is warm. No rash noted. Nails show no clubbing.  Psychiatric: She has a normal mood and affect.      Data Reviewed: Basic Metabolic Panel:  Recent Labs Lab 06/07/16 0223 06/08/16 0428  NA 136 135  K 3.9 3.2*  CL 99* 96*  CO2 25 29  GLUCOSE 93 137*  BUN 15 24*  CREATININE 1.10* 1.21*  CALCIUM 9.1 8.9  MG  --  1.8  PHOS  --  3.4   CBC:  Recent Labs Lab 06/07/16 0223 06/08/16 0428  WBC 12.7* 9.7  HGB 12.3 11.7*  HCT 36.1 34.1*  MCV 101.9* 102.8*  PLT 282 263   Cardiac Enzymes:  Recent Labs Lab 06/07/16 0223 06/07/16 0735 06/07/16 1243 06/07/16 1909  TROPONINI <0.03 0.03* <0.03 <0.03   BNP (last 3 results)  Recent Labs  03/22/16 0838 06/07/16 0223  BNP 580.0* 676.0*     CBG:  Recent Labs Lab 06/07/16 0857  GLUCAP 171*    Recent Results (from the past 240 hour(s))  MRSA PCR Screening     Status: None   Collection Time: 06/07/16  8:58 AM  Result Value Ref Range Status   MRSA by PCR NEGATIVE NEGATIVE Final    Comment:        The GeneXpert MRSA Assay (FDA approved for NASAL specimens only), is one component of a comprehensive MRSA colonization surveillance program. It is not intended to diagnose MRSA infection nor to guide or monitor treatment for MRSA infections.      Studies: Dg Chest 2 View  Result Date: 06/07/2016 CLINICAL DATA:  Cough and shortness of breath for 1 day. EXAM: CHEST  2 VIEW COMPARISON:  Radiograph 03/22/2016 FINDINGS: Stable cardiomegaly with prosthetic valve. Linear scarring in the left lower lung zone. Questionable small  right pleural effusion with blunting of the costophrenic angle and fluid in the fissures. There is mild vascular congestion. No focal airspace disease. Calcified granuloma in the right lung. Surgical clips project over the right hilum. IMPRESSION: Suspect mild fluid overload with vascular congestion, fluid in the fissures and possible small effusions. Cardiomegaly is stable. No focal airspace disease to suggest pneumonia. Electronically Signed   By: Jeb Levering M.D.   On: 06/07/2016 03:12   Dg Chest Port 1 View  Result Date: 06/08/2016 CLINICAL DATA:  Acute respiratory failure EXAM: PORTABLE CHEST 1 VIEW COMPARISON:  06/07/2016 FINDINGS: Prior valve replacement. Cardiomegaly. No confluent airspace opacities or effusions no acute bony abnormality. Healing left lateral eighth rib fracture noted. IMPRESSION: Cardiomegaly.  No acute findings. Electronically Signed   By: Rolm Baptise M.D.   On: 06/08/2016 07:48    Scheduled Meds: . anastrozole  1 mg Oral Daily  . aspirin EC  81 mg Oral Daily  . budesonide (PULMICORT) nebulizer solution  0.5 mg Nebulization Q12H  . diltiazem  90 mg Oral Q12H  . ferrous sulfate  325 mg Oral Q breakfast  . furosemide  40 mg Intravenous Daily  . hydroxyurea  500 mg Oral Once per day on Mon Tue Wed Thu Fri Sat  . levothyroxine  50 mcg Oral QAC breakfast  . metoprolol succinate  50 mg Oral BID  . potassium chloride  40 mEq Oral Daily  . pravastatin  20 mg Oral QHS  . predniSONE  5 mg Oral Q breakfast  . ursodiol  300 mg Oral QID  . warfarin  2 mg Oral ONCE-1800  . Warfarin - Pharmacist Dosing Inpatient   Does not apply q1800   Continuous Infusions: . diltiazem (CARDIZEM) infusion 10.5 mg/hr (06/08/16 1432)    Assessment/Plan:  1. Acute hypoxic respiratory failure requiring BiPAP on presentation. Patient now breathing comfortably on room air. 2. Acute 6 combined systolic and diastolic congestive heart failure. IV Lasix given. 3. Rapid atrial fibrillation on  Cardizem drip. This drip was not converted off yesterday. Add oral Cardizem to metoprolol XL. Try to get off Cardizem drip today. Coumadin pharmacy dosing. INR therapeutic 4. Relative hypotension. Hold lisinopril at this time 5. Hypothyroidism unspecified continue levothyroxine 6. History of cirrhosis on chronic prednisone. Patient also on ursodiol 7. Chronic kidney disease stage III. Watch closely with diuresis  Code Status:     Code Status Orders        Start     Ordered   06/07/16 0710  Full code  Continuous     06/07/16 0710    Code Status History    Date Active Date Inactive Code Status Order ID Comments User Context   06/07/2016  7:10 AM 06/07/2016  9:37 AM Full Code [id number]  Mikael Spray, NP ED   03/22/2016 12:32  PM 03/24/2016  2:32 PM Full Code [id number]  Theodoro Grist, MD Inpatient   05/02/2015  2:43 PM 05/03/2015  8:03 PM Full Code [id number]  Vaughan Basta, MD Inpatient     Family Communication: Daughter at the bedside Disposition Plan: To be determined  Consultants:  Patient was on the critical care specialist service yesterday  Cardiology  Time spent: 35 minutes  Loletha Grayer  [employer]

## 2016-06-08 NOTE — Progress Notes (Signed)
Initial Heart Failure Clinic appointment scheduled for June 26, 2016 at 11:30am. Thank you.

## 2016-06-08 NOTE — Progress Notes (Signed)
[place] for warfarin dosing Indication: atrial fibrillation  Allergies  Allergen Reactions  . Atorvastatin Shortness Of Breath    Other reaction(s): Unknown  . Calcium Carbonate     Other reaction(s): Unknown  . Fluoxetine     Other reaction(s): Unknown  . Hydrochlorothiazide     Other reaction(s): Unknown    Patient Measurements: Height: 5\' 2"  (157.5 cm) Weight: 196 lb 12.8 oz (89.3 kg) (Simultaneous filing. User may not have seen previous data.) IBW/kg (Calculated) : 50.1  Vital Signs: Temp: 97.8 F (36.6 C) (09/18 0755) Temp Source: Oral (09/18 0755) BP: 97/50 (09/18 0755) Pulse Rate: 103 (09/18 0755)  Labs:  Recent Labs  06/07/16 0223 06/07/16 0735 06/07/16 1243 06/07/16 1909 06/08/16 0428  HGB 12.3  --   --   --  11.7*  HCT 36.1  --   --   --  34.1*  PLT 282  --   --   --  263  LABPROT 29.8*  --   --   --  29.1*  INR 2.77  --   --   --  2.69  CREATININE 1.10*  --   --   --  1.21*  TROPONINI <0.03 0.03* <0.03 <0.03  --     Estimated Creatinine Clearance: 44.3 mL/min (by C-G formula based on SCr of 1.21 mg/dL (H)).   Medical History: Past Medical History:  Diagnosis Date  . Anemia   . Anxiety   . Atrial fibrillation ([place])   . B12 deficiency   . Breast cancer ([place])    RT Lumpectomy c radiation 2012  . Breast cancer ([place])   . CHF (congestive heart failure) ([place])   . Chronic kidney disease   . Cirrhosis ([place])   . COPD (chronic obstructive pulmonary disease) ([place])   . Depression   . GERD (gastroesophageal reflux disease)   . Headache   . Heart murmur   . Hyperlipemia   . Hyperlipidemia   . Hypertension   . Iron (Fe) deficiency anemia   . Shortness of breath dyspnea   . Thrombocytopenia ([place])   . Vitamin D deficiency     Medications:  Scheduled:  . anastrozole  1 mg Oral Daily  . aspirin EC  81 mg Oral Daily  . budesonide (PULMICORT) nebulizer solution  0.25 mg Nebulization Q12H  . ferrous sulfate   325 mg Oral Q breakfast  . furosemide  40 mg Intravenous Daily  . hydroxyurea  500 mg Oral Once per day on Mon Tue Wed Thu Fri Sat  . ipratropium-albuterol  3 mL Nebulization Q6H  . levothyroxine  50 mcg Oral QAC breakfast  . lisinopril  5 mg Oral Daily  . metoprolol succinate  50 mg Oral BID  . pravastatin  20 mg Oral QHS  . predniSONE  5 mg Oral Q breakfast  . ursodiol  300 mg Oral QID  . warfarin  2 mg Oral ONCE-1800  . Warfarin - Pharmacist Dosing Inpatient   Does not apply q1800   Infusions:  . diltiazem (CARDIZEM) infusion 13 mg/hr (06/08/16 0452)    Goal of Therapy:  INR 2-3 Monitor platelets by anticoagulation protocol: Yes   Assessment/Plan:  Pharmacy has been consulted for warfarin dosing in this 46 yoF with afib. Pt reports rotating warfarin 2 mg and 4 mg every other day and took 2 mg ngiht prior to admission. Baseline INR therapeutic  at 2.77. Will continue home dose and monitoring CBC, INR and interacting meds.  Date INR Dose         9/17 2.77 4 mg  9/18     2.69 2mg  9/19   Nancy Fetter, PharmD Clinical Pharmacist 06/08/2016 9:37 AM

## 2016-06-08 NOTE — Consult Note (Signed)
Order received to visit patient re: Advanced Directives.  Initial visit completed/form left by Charna Archer; evening chaplain returned to answer further questions, provide review of verbiage within form, address questions about dual POAs, and where patient is to file POA if family completes this form on their own.  Pt is tearful, shares life review and concerns regarding her health and other issues that are overwhelming her at this time.  Chaplain provided space for patient to offer her recognition of life's fragile balance, her own mortality and lack of self-care, and provided active listening as she engaged in creating a plan for making positive memories in the time remaining.  Scripture and prayer are important coping tools for this patient, so chaplain offered both in visit to provide comfort and validation to both her pain and need for affirmation regarding her impact within the structure of her family.

## 2016-06-08 NOTE — Progress Notes (Signed)
Pt. Slept throughout the night with no signs or c/o pain, SOB, or acute distress noted. Continues with Cardizem drip running at 13, pt HR ran between 70's-high 90's throughout the night. She flip flopped between A-fib/flutter. C/O of anxiety x1 with medication given, results were effective. Will continue to monitor pt.

## 2016-06-08 NOTE — Progress Notes (Signed)
Patient is complaining of generalized itching, verbal orders received from [place] for benadryl.

## 2016-06-08 NOTE — Care Management (Signed)
patient presents from home with with increasing shortness of breath. She required continuous bipap in icu stepdown.  Patient lives at home and has required home 02 in the past but not at present.  She is followed by Dr Vella Kohler for her copd.  She says she is able to perform all her adls and is able to drive.  Denies issues accessing medical care or obtaining medications.  She does not have any dme in the home. Discussed the need to assess for continuous home 02 with primary nurse / progression.  Even though patient is currently on room air at rest, will need to complete full assessment.  She "may be agreeable to home health nurse follow up"  She is current with her pcp- Dr Raechel Ache.

## 2016-06-08 NOTE — Progress Notes (Signed)
Pt.'s severity score on the RT protocol assessment was 6. Because of this the nebulizer treatments were changed to prn. She will ask for a treatment if needed

## 2016-06-09 LAB — BLOOD GAS, VENOUS
ACID-BASE EXCESS: 3.8 mmol/L — AB (ref 0.0–2.0)
BICARBONATE: 27.7 mmol/L (ref 20.0–28.0)
O2 SAT: 83 %
PATIENT TEMPERATURE: 37
PCO2 VEN: 38 mmHg — AB (ref 44.0–60.0)
PO2 VEN: 44 mmHg (ref 32.0–45.0)
pH, Ven: 7.47 — ABNORMAL HIGH (ref 7.250–7.430)

## 2016-06-09 LAB — BASIC METABOLIC PANEL
ANION GAP: 10 (ref 5–15)
BUN: 38 mg/dL — ABNORMAL HIGH (ref 6–20)
CALCIUM: 9.4 mg/dL (ref 8.9–10.3)
CHLORIDE: 97 mmol/L — AB (ref 101–111)
CO2: 30 mmol/L (ref 22–32)
Creatinine, Ser: 1.69 mg/dL — ABNORMAL HIGH (ref 0.44–1.00)
GFR calc Af Amer: 34 mL/min — ABNORMAL LOW (ref >60)
GFR calc non Af Amer: 29 mL/min — ABNORMAL LOW (ref >60)
GLUCOSE: 91 mg/dL (ref 65–99)
POTASSIUM: 4 mmol/L (ref 3.5–5.1)
Sodium: 137 mmol/L (ref 135–145)

## 2016-06-09 LAB — MAGNESIUM: Magnesium: 2.1 mg/dL (ref 1.7–2.4)

## 2016-06-09 LAB — PROTIME-INR
INR: 2.67
Prothrombin Time: 29 seconds — ABNORMAL HIGH (ref 11.4–15.2)

## 2016-06-09 MED ORDER — WARFARIN SODIUM 4 MG PO TABS
4.0000 mg | ORAL_TABLET | Freq: Once | ORAL | Status: AC
  Administered 2016-06-09: 4 mg via ORAL
  Filled 2016-06-09: qty 1

## 2016-06-09 MED ORDER — POTASSIUM CHLORIDE CRYS ER 20 MEQ PO TBCR
20.0000 meq | EXTENDED_RELEASE_TABLET | Freq: Every day | ORAL | Status: DC
  Administered 2016-06-10: 20 meq via ORAL
  Filled 2016-06-09: qty 1

## 2016-06-09 MED ORDER — AZITHROMYCIN 250 MG PO TABS
500.0000 mg | ORAL_TABLET | Freq: Every day | ORAL | Status: AC
  Administered 2016-06-09: 500 mg via ORAL
  Filled 2016-06-09: qty 2

## 2016-06-09 MED ORDER — AZITHROMYCIN 250 MG PO TABS
250.0000 mg | ORAL_TABLET | Freq: Every day | ORAL | Status: DC
  Administered 2016-06-10: 250 mg via ORAL
  Filled 2016-06-09: qty 1

## 2016-06-09 MED ORDER — ACETAMINOPHEN 325 MG PO TABS
650.0000 mg | ORAL_TABLET | Freq: Four times a day (QID) | ORAL | Status: DC | PRN
  Administered 2016-06-09: 650 mg via ORAL
  Filled 2016-06-09: qty 2

## 2016-06-09 MED ORDER — FUROSEMIDE 40 MG PO TABS
40.0000 mg | ORAL_TABLET | Freq: Every day | ORAL | Status: DC
  Administered 2016-06-09 – 2016-06-10 (×2): 40 mg via ORAL
  Filled 2016-06-09 (×2): qty 1

## 2016-06-09 NOTE — Progress Notes (Signed)
Patient ID: Adriana Martin, female   DOB: 18-May-1946, 70 y.o.   MRN: [id number]  [facility] PROGRESS NOTE  Adriana Martin [id number] DOB: 1945-11-21 DOA: 06/07/2016 PCP: [name], MD  HPI/Subjective: Patient coughing a lot. Still not feeling well. Still with some shortness of breath.  Objective: Vitals:   06/09/16 0811 06/09/16 1148  BP: 110/71 105/74  Pulse: 67 87  Resp:  19  Temp: 97.4 F (36.3 C) 97.9 F (36.6 C)    Filed Weights   06/07/16 0856 06/08/16 0508 06/09/16 0420  Weight: 87.2 kg (192 lb 3.9 oz) 89.3 kg (196 lb 12.8 oz) 87.4 kg (192 lb 10.9 oz)    ROS: Review of Systems  Constitutional: Negative for chills and fever.  Eyes: Negative for blurred vision.  Respiratory: Positive for cough, shortness of breath and wheezing.   Cardiovascular: Negative for chest pain.  Gastrointestinal: Negative for abdominal pain, constipation, diarrhea, nausea and vomiting.  Genitourinary: Negative for dysuria.  Musculoskeletal: Negative for joint pain.  Neurological: Negative for dizziness and headaches.   Exam: Physical Exam  Constitutional: She is oriented to person, place, and time.  HENT:  Nose: No mucosal edema.  Mouth/Throat: No oropharyngeal exudate or posterior oropharyngeal edema.  Eyes: Conjunctivae, EOM and lids are normal. Pupils are equal, round, and reactive to light.  Neck: No JVD present. Carotid bruit is not present. No edema present. No thyroid mass and no thyromegaly present.  Cardiovascular: S1 normal and S2 normal.  An irregularly irregular rhythm present. Exam reveals no gallop.   Murmur heard.  Systolic murmur is present with a grade of 2/6  Pulses:      Dorsalis pedis pulses are 2+ on the right side, and 2+ on the left side.  Respiratory: No respiratory distress. She has wheezes in the right middle field and the left middle field. She has no rhonchi. She has rales in the right lower field and the left lower field.  GI: Soft. Bowel  sounds are normal. There is no tenderness.  Musculoskeletal:       Right ankle: She exhibits swelling.       Left ankle: She exhibits swelling.  Lymphadenopathy:    She has no cervical adenopathy.  Neurological: She is alert and oriented to person, place, and time. No cranial nerve deficit.  Skin: Skin is warm. No rash noted. Nails show no clubbing.  Psychiatric: She has a normal mood and affect.      Data Reviewed: Basic Metabolic Panel:  Recent Labs Lab 06/07/16 0223 06/08/16 0428 06/09/16 0514  NA 136 135 137  K 3.9 3.2* 4.0  CL 99* 96* 97*  CO2 25 29 30   GLUCOSE 93 137* 91  BUN 15 24* 38*  CREATININE 1.10* 1.21* 1.69*  CALCIUM 9.1 8.9 9.4  MG  --  1.8 2.1  PHOS  --  3.4  --    CBC:  Recent Labs Lab 06/07/16 0223 06/08/16 0428  WBC 12.7* 9.7  HGB 12.3 11.7*  HCT 36.1 34.1*  MCV 101.9* 102.8*  PLT 282 263   Cardiac Enzymes:  Recent Labs Lab 06/07/16 0223 06/07/16 0735 06/07/16 1243 06/07/16 1909  TROPONINI <0.03 0.03* <0.03 <0.03   BNP (last 3 results)  Recent Labs  03/22/16 0838 06/07/16 0223  BNP 580.0* 676.0*     CBG:  Recent Labs Lab 06/07/16 0857  GLUCAP 171*    Recent Results (from the past 240 hour(s))  MRSA PCR Screening     Status: None  Collection Time: 06/07/16  8:58 AM  Result Value Ref Range Status   MRSA by PCR NEGATIVE NEGATIVE Final    Comment:        The GeneXpert MRSA Assay (FDA approved for NASAL specimens only), is one component of a comprehensive MRSA colonization surveillance program. It is not intended to diagnose MRSA infection nor to guide or monitor treatment for MRSA infections.      Studies: Dg Chest Port 1 View  Result Date: 06/08/2016 CLINICAL DATA:  Acute respiratory failure EXAM: PORTABLE CHEST 1 VIEW COMPARISON:  06/07/2016 FINDINGS: Prior valve replacement. Cardiomegaly. No confluent airspace opacities or effusions no acute bony abnormality. Healing left lateral eighth rib fracture noted.  IMPRESSION: Cardiomegaly.  No acute findings. Electronically Signed   By: Rolm Baptise M.D.   On: 06/08/2016 07:48    Scheduled Meds: . anastrozole  1 mg Oral Daily  . aspirin EC  81 mg Oral Daily  . azithromycin  500 mg Oral Daily   Followed by  . [START ON 06/10/2016] azithromycin  250 mg Oral Daily  . budesonide (PULMICORT) nebulizer solution  0.5 mg Nebulization Q12H  . diltiazem  90 mg Oral Q12H  . ferrous sulfate  325 mg Oral Q breakfast  . furosemide  40 mg Oral Daily  . hydroxyurea  500 mg Oral Once per day on Mon Tue Wed Thu Fri Sat  . levothyroxine  50 mcg Oral QAC breakfast  . metoprolol succinate  50 mg Oral BID  . [START ON 06/10/2016] potassium chloride  20 mEq Oral Daily  . pravastatin  20 mg Oral QHS  . predniSONE  5 mg Oral Q breakfast  . ursodiol  300 mg Oral QID  . warfarin  4 mg Oral ONCE-1800  . Warfarin - Pharmacist Dosing Inpatient   Does not apply q1800    Assessment/Plan:  1. Acute hypoxic respiratory failure requiring BiPAP on presentation. Patient now breathing comfortably on room air. 2. Acute combined systolic and diastolic congestive heart failure. Change Lasix to by mouth secondary to bumping creatinine 3. Rapid atrial fibrillation. Patient's heart rate is controlled on oral Cardizem and metoprolol. Coumadin pharmacy dosing. INR therapeutic 4. COPD exacerbation. Try to hold off on systemic steroids continue budesonide and DuoNeb nebulizers. She is on chronic prednisone. 5. Hypothyroidism unspecified continue levothyroxine 6. History of cirrhosis on chronic prednisone. Patient also on ursodiol 7. Chronic kidney disease stage III. Watch closely with diuresis. 8. Weakness. Physical therapy evaluation.  Code Status:     Code Status Orders        Start     Ordered   06/07/16 0710  Full code  Continuous     06/07/16 0710    Code Status History    Date Active Date Inactive Code Status Order ID Comments User Context   06/07/2016  7:10 AM 06/07/2016   9:37 AM Full Code [id number]  Mikael Spray, NP ED   03/22/2016 12:32 PM 03/24/2016  2:32 PM Full Code [id number]  Theodoro Grist, MD Inpatient   05/02/2015  2:43 PM 05/03/2015  8:03 PM Full Code [id number]  Vaughan Basta, MD Inpatient     Family Communication: Daughter at the bedside Disposition Plan: To be determined  Consultants:  Patient was on the critical care specialist service yesterday  Cardiology  Time spent: 25 minutes  Loletha Grayer  [employer]

## 2016-06-09 NOTE — Progress Notes (Signed)
SATURATION QUALIFICATIONS: (This note is used to comply with regulatory documentation for home oxygen) ? ?Patient Saturations on Room Air at Rest = 97% ? ?Patient Saturations on Room Air while Ambulating = 93% ? ?Patient Saturations on 0 Liters of oxygen while Ambulating = 93% ? ?Please briefly explain why patient needs home oxygen: ?

## 2016-06-09 NOTE — Care Management (Signed)
CM informed that patient did not qualify for home 02 this day.  room air ambulatory sats 93%.

## 2016-06-09 NOTE — Care Management Important Message (Signed)
Important Message  Patient Details  Name: Adriana Martin MRN: [id number] Date of Birth: [birth year]   Medicare Important Message Given:  Yes    Katrina Stack, RN 06/09/2016, 4:02 PM

## 2016-06-09 NOTE — Care Management (Signed)
Addressed the need to assess patient for home 02 again in progression

## 2016-06-09 NOTE — Progress Notes (Signed)
[place] for warfarin dosing Indication: atrial fibrillation  Allergies  Allergen Reactions  . Atorvastatin Shortness Of Breath    Other reaction(s): Unknown  . Calcium Carbonate     Other reaction(s): Unknown  . Fluoxetine     Other reaction(s): Unknown  . Hydrochlorothiazide     Other reaction(s): Unknown    Patient Measurements: Height: 5\' 2"  (157.5 cm) Weight: 192 lb 10.9 oz (87.4 kg) IBW/kg (Calculated) : 50.1  Vital Signs: Temp: 97.5 F (36.4 C) (09/19 0420) Temp Source: Oral (09/19 0420) BP: 120/74 (09/19 0420) Pulse Rate: 76 (09/19 0420)  Labs:  Recent Labs  06/07/16 0223 06/07/16 0735 06/07/16 1243 06/07/16 1909 06/08/16 0428 06/09/16 0514  HGB 12.3  --   --   --  11.7*  --   HCT 36.1  --   --   --  34.1*  --   PLT 282  --   --   --  263  --   LABPROT 29.8*  --   --   --  29.1* 29.0*  INR 2.77  --   --   --  2.69 2.67  CREATININE 1.10*  --   --   --  1.21* 1.69*  TROPONINI <0.03 0.03* <0.03 <0.03  --   --     Estimated Creatinine Clearance: 31.3 mL/min (by C-G formula based on SCr of 1.69 mg/dL (H)).   Medical History: Past Medical History:  Diagnosis Date  . Anemia   . Anxiety   . Atrial fibrillation ([place])   . B12 deficiency   . Breast cancer ([place])    RT Lumpectomy c radiation 2012  . Breast cancer ([place])   . CHF (congestive heart failure) ([place])   . Chronic kidney disease   . Cirrhosis ([place])   . COPD (chronic obstructive pulmonary disease) ([place])   . Depression   . GERD (gastroesophageal reflux disease)   . Headache   . Heart murmur   . Hyperlipemia   . Hyperlipidemia   . Hypertension   . Iron (Fe) deficiency anemia   . Shortness of breath dyspnea   . Thrombocytopenia ([place])   . Vitamin D deficiency     Medications:  Scheduled:  . anastrozole  1 mg Oral Daily  . aspirin EC  81 mg Oral Daily  . budesonide (PULMICORT) nebulizer solution  0.5 mg Nebulization Q12H  . diltiazem  90 mg Oral Q12H   . ferrous sulfate  325 mg Oral Q breakfast  . hydroxyurea  500 mg Oral Once per day on Mon Tue Wed Thu Fri Sat  . levothyroxine  50 mcg Oral QAC breakfast  . metoprolol succinate  50 mg Oral BID  . potassium chloride  40 mEq Oral Daily  . pravastatin  20 mg Oral QHS  . predniSONE  5 mg Oral Q breakfast  . ursodiol  300 mg Oral QID  . Warfarin - Pharmacist Dosing Inpatient   Does not apply q1800   Infusions:     Goal of Therapy:  INR 2-3 Monitor platelets by anticoagulation protocol: Yes   Assessment/Plan:  Pharmacy has been consulted for warfarin dosing in this 23 yoF with afib. Pt reports rotating warfarin 2 mg and 4 mg every other day and took 2 mg night prior to admission. Baseline INR therapeutic  at 2.77. Will continue home dose and monitoring CBC, INR and interacting meds.  Plan to give warfarin 4mg  tonight.   Date INR Dose  9/17 2.77 4 mg  9/18     2.69 2mg  9/19 2.67    Loree Fee, PharmD 06/09/2016 7:41 AM

## 2016-06-09 NOTE — Progress Notes (Signed)
PT Cancellation Note  Patient Details Name: Adriana Martin MRN: [id number] DOB: Oct 07, 1945   Cancelled Treatment:    Reason Eval/Treat Not Completed: Other (comment) Pt declined participation with PT at this time due to wanting to rest after ambulating with RN around the nursing station. RN reports that pt did well and O2 sats maintained at 93% on room air. PT will return at a later time/date to complete PT evaluation as appropriate.    Riley Nearing, SPT 06/09/2016, 2:21 PM

## 2016-06-10 LAB — BASIC METABOLIC PANEL
ANION GAP: 9 (ref 5–15)
BUN: 37 mg/dL — ABNORMAL HIGH (ref 6–20)
CALCIUM: 9.2 mg/dL (ref 8.9–10.3)
CO2: 33 mmol/L — AB (ref 22–32)
Chloride: 97 mmol/L — ABNORMAL LOW (ref 101–111)
Creatinine, Ser: 1.36 mg/dL — ABNORMAL HIGH (ref 0.44–1.00)
GFR, EST AFRICAN AMERICAN: 44 mL/min — AB (ref >60)
GFR, EST NON AFRICAN AMERICAN: 38 mL/min — AB (ref >60)
Glucose, Bld: 76 mg/dL (ref 65–99)
POTASSIUM: 3.6 mmol/L (ref 3.5–5.1)
Sodium: 139 mmol/L (ref 135–145)

## 2016-06-10 LAB — PROTIME-INR
INR: 2.1
Prothrombin Time: 23.9 seconds — ABNORMAL HIGH (ref 11.4–15.2)

## 2016-06-10 MED ORDER — DILTIAZEM HCL ER 90 MG PO CP12: 90.0000 mg | ORAL_CAPSULE | Freq: Two times a day (BID) | ORAL | 0 refills | Status: DC

## 2016-06-10 MED ORDER — AZITHROMYCIN 250 MG PO TABS: ORAL_TABLET | ORAL | 0 refills | Status: DC

## 2016-06-10 MED ORDER — WARFARIN SODIUM 1 MG PO TABS: 2.0000 mg | ORAL_TABLET | Freq: Once | ORAL | Status: DC

## 2016-06-10 MED ORDER — IPRATROPIUM-ALBUTEROL 0.5-2.5 (3) MG/3ML IN SOLN: 3.0000 mL | Freq: Four times a day (QID) | RESPIRATORY_TRACT | 0 refills | Status: DC | PRN

## 2016-06-10 NOTE — Progress Notes (Signed)
Patient discharge home with daughter. Discharge instructions as well as prescriptions  given and explained to patient and patient's  Daughter at bedside, verbalized understanding. Will wheel patient out.

## 2016-06-10 NOTE — Care Management (Signed)
Discharge to home today per Dr. Leslye Peer. No follow-up needs per physical therapy. Family/friend will transport. Shelbie Ammons RN MSN CCM Care Management [phone]

## 2016-06-10 NOTE — Progress Notes (Signed)
Pt complained of rt shoulder pain and requested Tylenol for pain, pt said she has taken Tylenol for pain in the past. Pt had no PRN medications for pain. MD made aware, 650 mg PO PRN given.

## 2016-06-10 NOTE — Progress Notes (Signed)
[place] for warfarin dosing Indication: atrial fibrillation  Allergies  Allergen Reactions  . Atorvastatin Shortness Of Breath    Other reaction(s): Unknown  . Calcium Carbonate     Other reaction(s): Unknown  . Fluoxetine     Other reaction(s): Unknown  . Hydrochlorothiazide     Other reaction(s): Unknown    Patient Measurements: Height: 5\' 2"  (157.5 cm) Weight: 185 lb 3.2 oz (84 kg) IBW/kg (Calculated) : 50.1  Vital Signs: Temp: 97.7 F (36.5 C) (09/20 0752) Temp Source: Oral (09/20 0752) BP: 131/69 (09/20 0752) Pulse Rate: 127 (09/20 1023)  Labs:  Recent Labs  06/07/16 1243 06/07/16 1909 06/08/16 0428 06/09/16 0514 06/10/16 0605  HGB  --   --  11.7*  --   --   HCT  --   --  34.1*  --   --   PLT  --   --  263  --   --   LABPROT  --   --  29.1* 29.0* 23.9*  INR  --   --  2.69 2.67 2.10  CREATININE  --   --  1.21* 1.69* 1.36*  TROPONINI <0.03 <0.03  --   --   --     Estimated Creatinine Clearance: 38.2 mL/min (by C-G formula based on SCr of 1.36 mg/dL (H)).   Medical History: Past Medical History:  Diagnosis Date  . Anemia   . Anxiety   . Atrial fibrillation ([place])   . B12 deficiency   . Breast cancer ([place])    RT Lumpectomy c radiation 2012  . Breast cancer ([place])   . CHF (congestive heart failure) ([place])   . Chronic kidney disease   . Cirrhosis ([place])   . COPD (chronic obstructive pulmonary disease) ([place])   . Depression   . GERD (gastroesophageal reflux disease)   . Headache   . Heart murmur   . Hyperlipemia   . Hyperlipidemia   . Hypertension   . Iron (Fe) deficiency anemia   . Shortness of breath dyspnea   . Thrombocytopenia ([place])   . Vitamin D deficiency     Medications:  Scheduled:  . anastrozole  1 mg Oral Daily  . aspirin EC  81 mg Oral Daily  . azithromycin  250 mg Oral Daily  . budesonide (PULMICORT) nebulizer solution  0.5 mg Nebulization Q12H  . diltiazem  90 mg Oral Q12H  . ferrous  sulfate  325 mg Oral Q breakfast  . furosemide  40 mg Oral Daily  . hydroxyurea  500 mg Oral Once per day on Mon Tue Wed Thu Fri Sat  . levothyroxine  50 mcg Oral QAC breakfast  . metoprolol succinate  50 mg Oral BID  . potassium chloride  20 mEq Oral Daily  . pravastatin  20 mg Oral QHS  . predniSONE  5 mg Oral Q breakfast  . ursodiol  300 mg Oral QID  . warfarin  2 mg Oral ONCE-1800  . Warfarin - Pharmacist Dosing Inpatient   Does not apply q1800   Infusions:     Goal of Therapy:  INR 2-3 Monitor platelets by anticoagulation protocol: Yes   Assessment/Plan:  Pharmacy has been consulted for warfarin dosing in this 26 yoF with afib. Pt reports rotating warfarin 2 mg and 4 mg every other day and took 2 mg night prior to admission. Baseline INR therapeutic  at 2.77. Will continue home dose and monitoring CBC, INR and interacting meds.  Plan to give warfarin  2mg  tonight based on patients home regimen.   Date INR Dose         9/17 2.77 4 mg  9/18     2.69 2mg  9/19 2.67 4mg  9/20     2.10   Pernell Dupre, PharmD 06/10/2016 11:23 AM

## 2016-06-10 NOTE — Evaluation (Signed)
Physical Therapy Evaluation Patient Details Name: Adriana Martin MRN: [id number] DOB: May 09, 1946 Today's Date: 06/10/2016   History of Present Illness  Pt is a 70 y/o female presenting with cough, SOB, and emesis. Pt admitted for acute exacerbation of CHF and had to go to the ICU for acute respiratory failure. Pt imaging 06/08/16 showed cardiomegaly and mild fluid overload. PMH of anemia, anxiety, A-fib; breast CA (with R lumpectomy), CHF, COPD, heart murmur, CKD, B12 and vitamin D deficiency.   Clinical Impression  Pt lives in a mobile home with 3 stairs with 2 handrails in reach to get into home. Pt able to ambulate limited community distances without the use of an assistive device. Pt reports that neighbor is able to assist her PRN when she returns home. At this time Pt able to complete bed mobility and transfers independently (SBA for safety only) and ambulate 110' to/from stairs modified independently due to slightly decreased velocity and occasional furniture cruising. Pt able to ambulate 5 stairs with 1 railing and HHA (highest HR noted throughout session at 127 bpm; returned to 117 bpm quickly). O2 sats maintained >93% throughout session. Pt able to tolerate mild balance challenges while ambulating without any LOB. Pt is at baseline function and does not require any PT follow up at this time (MD notified).     Follow Up Recommendations No PT follow up    Equipment Recommendations  None recommended by PT    Recommendations for Other Services       Precautions / Restrictions Precautions Precautions: Fall Restrictions Weight Bearing Restrictions: No      Mobility  Bed Mobility Overal bed mobility: Independent             General bed mobility comments: Pt safely and quickly transfers supine to sit without needing any cuing.  Transfers Overall transfer level: Independent Equipment used: None             General transfer comment: Pt able to tranfer sit to stand x3  independently without needing any cuing  Ambulation/Gait Ambulation/Gait assistance: Min guard (min guard for safety only; modified independent due to increased time and occasional furniture cruising) Ambulation Distance (Feet): 110 Feet (110' to from room to stairwell; 110' from stairwell to bed (sitting for chair set up); 5') Assistive device: None Gait Pattern/deviations: Step-through pattern Gait velocity: slightly decreased   General Gait Details: No LOB; HR elevated from 91 bpm at rest to 107 bpm.  Stairs Stairs: Yes Stairs assistance: Min assist Stair Management: One rail Right (Hand held assist on L) Number of Stairs: 10 (5 up; 5 down) General stair comments: Pt HR elevated to 127 bpm with going up stairs; HR 117 bpm after going down steps; Pt has 3 steps with 2 rails at home (pt should be able to complete home steps modified independent; min assist only due to [facility])  Wheelchair Mobility    Modified Rankin (Stroke Patients Only)       Balance Overall balance assessment: Independent (Pt able to sit EOB without feet or UE support without LOB; Pt able to tolerate vertical and horizontal head turns along with changing gait speed without any LOB during ambulation. )                                           Pertinent Vitals/Pain Pain Assessment: No/denies pain    Home Living Family/patient expects  to be discharged to:: Private residence Living Arrangements: Alone Available Help at Discharge: Neighbor Type of Home: Mobile home Home Access: Stairs to enter Entrance Stairs-Rails: Can reach both Entrance Stairs-Number of Steps: 3 Home Layout: One level Home Equipment: [place] - 2 wheels;Cane - single point (Pt does not use either at baseline; has parents old equipment in the home)      Prior Function Level of Independence: Independent               Hand Dominance   Dominant Hand: Right    Extremity/Trunk Assessment   Upper Extremity  Assessment: Generalized weakness (B shoulder flexion grossly 3-/5; pt recieves cortisone shots for R UE with AROM flexion painful; gross grip strength WFLs (L>R))           Lower Extremity Assessment: Overall WFL for tasks assessed (grossly 4/5 for B hip flexion and knee extension)         Communication   Communication: No difficulties  Cognition Arousal/Alertness: Awake/alert Behavior During Therapy: WFL for tasks assessed/performed Overall Cognitive Status: Within Functional Limits for tasks assessed                      General Comments General comments (skin integrity, edema, etc.): Pt agreeable to PT session.     Exercises     Assessment/Plan    PT Assessment Patent does not need any further PT services  PT Problem List            PT Treatment Interventions      PT Goals (Current goals can be found in the Care Plan section)  Acute Rehab PT Goals Patient Stated Goal: To go home.  PT Goal Formulation: With patient Time For Goal Achievement: 06/24/16 Potential to Achieve Goals: Good    Frequency     Barriers to discharge        Co-evaluation               End of Session Equipment Utilized During Treatment: Gait belt Activity Tolerance: Patient tolerated treatment well Patient left: in chair;with call bell/phone within reach;with chair alarm set Nurse Communication: Mobility status         Time: 0902-0921 PT Time Calculation (min) (ACUTE ONLY): 19 min   Charges:         PT G Codes:        Adriana Martin, SPT 06/10/2016, 10:48 AM

## 2016-06-10 NOTE — Discharge Summary (Signed)
[facility] [place] NAME: Adriana Martin    MR#:  [id number]  DATE OF BIRTH:  11-07-1945  DATE OF ADMISSION:  06/07/2016 ADMITTING PHYSICIAN: Flora Lipps, MD  DATE OF DISCHARGE: 06/10/2016 12:01 PM  PRIMARY CARE PHYSICIAN: Raechel Ache, DAVID, MD    ADMISSION DIAGNOSIS:  Acute respiratory failure ([place]) [J96.00] Shortness of breath [R06.02] Acute pulmonary edema (HCC) [J81.0] Dyspnea [R06.00] Atrial fibrillation with rapid ventricular response (HCC) [I48.91]  DISCHARGE DIAGNOSIS:  Active Problems:   Acute exacerbation of CHF (congestive heart failure) ([place])   SECONDARY DIAGNOSIS:   Past Medical History:  Diagnosis Date  . Anemia   . Anxiety   . Atrial fibrillation ([place])   . B12 deficiency   . Breast cancer ([place])    RT Lumpectomy c radiation 2012  . Breast cancer ([place])   . CHF (congestive heart failure) ([place])   . Chronic kidney disease   . Cirrhosis ([place])   . COPD (chronic obstructive pulmonary disease) ([place])   . Depression   . GERD (gastroesophageal reflux disease)   . Headache   . Heart murmur   . Hyperlipemia   . Hyperlipidemia   . Hypertension   . Iron (Fe) deficiency anemia   . Shortness of breath dyspnea   . Thrombocytopenia ([place])   . Vitamin D deficiency     HOSPITAL COURSE:   1. Acute hypoxic respiratory failure requiring BiPAP on presentation. Patient breathing comfortably on room air. 2. Acute combined systolic and diastolic congestive heart failure. Patient was diuresed with IV Lasix. I switched the patient back over to oral Lasix. Patient is on metoprolol. Patient can go back on lisinopril as outpatient. 3. Rapid atrial fibrillation. Patient was on Cardizem drip initially. When I picked up the patient patient was still on Cardizem drip even though cardiology taking the patient off the Cardizem drip and switching to oral Cardizem. The patient was still on the critical care service at that point. I switched the patient  over to oral Cardizem. Heart rate is better than on presentation. Pharmacy was dosing Coumadin and INR therapeutic during the hospital course can go back on usual scheduled Coumadin as outpatient. Follow-up with Dr. Nehemiah Massed as outpatient for Coumadin testing. 4. COPD exacerbation. I tried to hold off on systemic steroids since the patient is on chronic prednisone. Patient will go back on her Advair. I did prescribe DuoNeb nebulizer solution. 5. Hypothyroidism unspecified continue levothyroxine 6. History of cirrhosis on chronic prednisone. Patient also on ursodiol 7. Chronic kidney disease stage III. Need to watch as outpatient with diuresis 8. Patient did well with physical therapy  DISCHARGE CONDITIONS:   Satisfactory  CONSULTS OBTAINED:  Treatment Team:  Isaias Cowman, MD  DRUG ALLERGIES:   Allergies  Allergen Reactions  . Atorvastatin Shortness Of Breath    Other reaction(s): Unknown  . Calcium Carbonate     Other reaction(s): Unknown  . Fluoxetine     Other reaction(s): Unknown  . Hydrochlorothiazide     Other reaction(s): Unknown    DISCHARGE MEDICATIONS:   Discharge Medication List as of 06/10/2016 11:08 AM    START taking these medications   Details  azithromycin (ZITHROMAX) 250 MG tablet Take one tab day for three more days, Print    ipratropium-albuterol (DUONEB) 0.5-2.5 (3) MG/3ML SOLN Take 3 mLs by nebulization every 6 (six) hours as needed., Starting Wed 06/10/2016, Print      CONTINUE these medications which have CHANGED   Details  diltiazem (CARDIZEM  SR) 90 MG 12 hr capsule Take 1 capsule (90 mg total) by mouth every 12 (twelve) hours., Starting Wed 06/10/2016, Print      CONTINUE these medications which have NOT CHANGED   Details  ADVAIR DISKUS 100-50 MCG/DOSE AEPB Inhale 1 puff into the lungs 2 (two) times daily. , Starting 10/29/2014, Until Discontinued, Historical Med    alendronate (FOSAMAX) 70 MG tablet Take 70 mg by mouth once a week. Take with  a full glass of water on an empty stomach. *Take on Sunday*, Until Discontinued, Historical Med    ALPRAZolam (XANAX) 0.5 MG tablet Take 0.5 mg by mouth 2 (two) times daily as needed for anxiety., Until Discontinued, Historical Med    anastrozole (ARIMIDEX) 1 MG tablet Take 1 tablet (1 mg total) by mouth daily., Starting 03/18/2016, Until Discontinued, Normal    aspirin EC 81 MG tablet Take 81 mg by mouth daily., Until Discontinued, Historical Med    Ferrous Sulfate 134 MG TABS Take 1 tablet by mouth 2 (two) times daily., Until Discontinued, Historical Med    furosemide (LASIX) 40 MG tablet Take 40 mg by mouth daily. , Starting 04/03/2016, Until Discontinued, Historical Med    hydroxyurea (HYDREA) 500 MG capsule TAKE 1 CAPSULE BY MOUTH 6 TIMES A WEEK, Normal    levothyroxine (SYNTHROID, LEVOTHROID) 50 MCG tablet Take 50 mcg by mouth daily before breakfast., Until Discontinued, Historical Med    lisinopril (PRINIVIL,ZESTRIL) 5 MG tablet Take 5 mg by mouth daily. , Starting 10/07/2015, Until Discontinued, Historical Med    metoprolol succinate (TOPROL-XL) 50 MG 24 hr tablet Take 50 mg by mouth 2 (two) times daily. Take with or immediately following a meal., Until Discontinued, Historical Med    Multiple Vitamins-Minerals (CENTRUM SILVER ULTRA WOMENS) TABS Take 1 tablet by mouth daily., Until Discontinued, Historical Med    potassium chloride SA (K-DUR,KLOR-CON) 20 MEQ tablet take 1 capsule by mouth daily, Historical Med    pravastatin (PRAVACHOL) 20 MG tablet Take 20 mg by mouth at bedtime., Until Discontinued, Historical Med    predniSONE (DELTASONE) 5 MG tablet Take 5 mg by mouth daily with breakfast., Until Discontinued, Historical Med    PROAIR HFA 108 (90 BASE) MCG/ACT inhaler Inhale 2 puffs into the lungs every 4 (four) hours as needed for wheezing or shortness of breath. , Starting 10/29/2014, Until Discontinued, Historical Med    Probiotic Product ([place]) CAPS Take 1  capsule by mouth daily., Until Discontinued, Historical Med    ursodiol (ACTIGALL) 300 MG capsule Take 300 mg by mouth 4 (four) times daily. , Until Discontinued, Historical Med    Vitamin D, Ergocalciferol, (DRISDOL) 50000 units CAPS capsule Take 50,000 Units by mouth every 14 (fourteen) days. , Starting 07/31/2015, Until Discontinued, Historical Med    warfarin (COUMADIN) 2 MG tablet Take 2-4 mg by mouth See admin instructions. Take 1 tablet by mouth every other day, alternating with 2 tablets (4 mg) by mouth every other day. Patient not sure of what days take the 2mg  or 4mg  on, Starting Mon 04/01/2015, Historical Med      STOP taking these medications     amiodarone (PACERONE) 200 MG tablet      guaiFENesin (MUCINEX) 600 MG 12 hr tablet          DISCHARGE INSTRUCTIONS:   Follow-up with Dr. Nehemiah Massed one week Follow-up with PMD one week  If you experience worsening of your admission symptoms, develop shortness of breath, life threatening emergency, suicidal or homicidal thoughts  you must seek medical attention immediately by calling 911 or calling your MD immediately  if symptoms less severe.  You Must read complete instructions/literature along with all the possible adverse reactions/side effects for all the Medicines you take and that have been prescribed to you. Take any new Medicines after you have completely understood and accept all the possible adverse reactions/side effects.   Please note  You were cared for by a hospitalist during your hospital stay. If you have any questions about your discharge medications or the care you received while you were in the hospital after you are discharged, you can call the unit and asked to speak with the hospitalist on call if the hospitalist that took care of you is not available. Once you are discharged, your primary care physician will handle any further medical issues. Please note that NO REFILLS for any discharge medications will be  authorized once you are discharged, as it is imperative that you return to your primary care physician (or establish a relationship with a primary care physician if you do not have one) for your aftercare needs so that they can reassess your need for medications and monitor your lab values.    Today   CHIEF COMPLAINT:   Chief Complaint  Patient presents with  . Cough  . Shortness of Breath  . Emesis    HISTORY OF PRESENT ILLNESS:  Adriana Martin  is a 70 y.o. female present with cough shortness of breath and vomiting. Patient was admitted to the critical care service on BiPAP   VITAL SIGNS:  Blood pressure 131/69, pulse (!) 127, temperature 97.7 F (36.5 C), temperature source Oral, resp. rate 18, height 5\' 2"  (1.575 m), weight 84 kg (185 lb 3.2 oz), SpO2 93 %. Patient's heart rate when I saw her was 82. When I spoke with the telemetry monitoring the heart rate was in the 70s and 80s.   PHYSICAL EXAMINATION:  GENERAL:  70 y.o.-year-old patient lying in the bed with no acute distress.  EYES: Pupils equal, round, reactive to light and accommodation. No scleral icterus. Extraocular muscles intact.  HEENT: Head atraumatic, normocephalic. Oropharynx and nasopharynx clear.  NECK:  Supple, no jugular venous distention. No thyroid enlargement, no tenderness.  LUNGS: Normal breath sounds bilaterally, no wheezing, rales,rhonchi or crepitation. No use of accessory muscles of respiration.  CARDIOVASCULAR: S1, S2 Irregularly irregular. 2/6 systolic murmurs, no rubs, or gallops.  ABDOMEN: Soft, non-tender, non-distended. Bowel sounds present. No organomegaly or mass.  EXTREMITIES: Trace edema, cyanosis, or clubbing.  NEUROLOGIC: Cranial nerves II through XII are intact. Muscle strength 5/5 in all extremities. Sensation intact. Gait not checked.  PSYCHIATRIC: The patient is alert and oriented x 3.  SKIN: No obvious rash, lesion, or ulcer.   DATA REVIEW:   CBC  Recent Labs Lab  06/08/16 0428  WBC 9.7  HGB 11.7*  HCT 34.1*  PLT 263    Chemistries   Recent Labs Lab 06/09/16 0514 06/10/16 0605  NA 137 139  K 4.0 3.6  CL 97* 97*  CO2 30 33*  GLUCOSE 91 76  BUN 38* 37*  CREATININE 1.69* 1.36*  CALCIUM 9.4 9.2  MG 2.1  --     Cardiac Enzymes  Recent Labs Lab 06/07/16 1909  TROPONINI <0.03    Microbiology Results  Results for orders placed or performed during the hospital encounter of 06/07/16  MRSA PCR Screening     Status: None   Collection Time: 06/07/16  8:58 AM  Result  Value Ref Range Status   MRSA by PCR NEGATIVE NEGATIVE Final    Comment:        The GeneXpert MRSA Assay (FDA approved for NASAL specimens only), is one component of a comprehensive MRSA colonization surveillance program. It is not intended to diagnose MRSA infection nor to guide or monitor treatment for MRSA infections.      Management plans discussed with the patient, family and they are in agreement.  CODE STATUS:  Code Status History    Date Active Date Inactive Code Status Order ID Comments User Context   06/07/2016  7:10 AM 06/07/2016  9:37 AM Full Code [id number]  Mikael Spray, NP ED   03/22/2016 12:32 PM 03/24/2016  2:32 PM Full Code [id number]  Theodoro Grist, MD Inpatient   05/02/2015  2:43 PM 05/03/2015  8:03 PM Full Code [id number]  Vaughan Basta, MD Inpatient      TOTAL TIME TAKING CARE OF THIS PATIENT: 35 minutes.    Loletha Grayer M.D on 06/10/2016 at 4:45 PM  Between 7am to 6pm - Pager - [phone]  After 6pm go to [email or web address] - password [email or web address] [facility]  [phone]  CC: Primary care physician; [name], MD

## 2016-06-10 NOTE — Discharge Instructions (Signed)

## 2016-06-16 ENCOUNTER — Other Ambulatory Visit: Payer: Self-pay

## 2016-06-16 ENCOUNTER — Inpatient Hospital Stay: Payer: Medicare Other | Attending: Internal Medicine

## 2016-06-16 ENCOUNTER — Inpatient Hospital Stay (HOSPITAL_BASED_OUTPATIENT_CLINIC_OR_DEPARTMENT_OTHER): Payer: Medicare Other | Admitting: Internal Medicine

## 2016-06-16 ENCOUNTER — Encounter: Payer: Self-pay | Admitting: Internal Medicine

## 2016-06-16 DIAGNOSIS — I4891 Unspecified atrial fibrillation: Secondary | ICD-10-CM

## 2016-06-16 DIAGNOSIS — J449 Chronic obstructive pulmonary disease, unspecified: Secondary | ICD-10-CM | POA: Insufficient documentation

## 2016-06-16 DIAGNOSIS — I509 Heart failure, unspecified: Secondary | ICD-10-CM | POA: Insufficient documentation

## 2016-06-16 DIAGNOSIS — Z170 Estrogen receptor positive status [ER+]: Secondary | ICD-10-CM

## 2016-06-16 DIAGNOSIS — D473 Essential (hemorrhagic) thrombocythemia: Secondary | ICD-10-CM | POA: Diagnosis not present

## 2016-06-16 DIAGNOSIS — Z79899 Other long term (current) drug therapy: Secondary | ICD-10-CM | POA: Insufficient documentation

## 2016-06-16 DIAGNOSIS — Z7982 Long term (current) use of aspirin: Secondary | ICD-10-CM | POA: Diagnosis not present

## 2016-06-16 DIAGNOSIS — F329 Major depressive disorder, single episode, unspecified: Secondary | ICD-10-CM | POA: Diagnosis not present

## 2016-06-16 DIAGNOSIS — D509 Iron deficiency anemia, unspecified: Secondary | ICD-10-CM

## 2016-06-16 DIAGNOSIS — Z79811 Long term (current) use of aromatase inhibitors: Secondary | ICD-10-CM | POA: Diagnosis not present

## 2016-06-16 DIAGNOSIS — F419 Anxiety disorder, unspecified: Secondary | ICD-10-CM | POA: Insufficient documentation

## 2016-06-16 DIAGNOSIS — E559 Vitamin D deficiency, unspecified: Secondary | ICD-10-CM | POA: Diagnosis not present

## 2016-06-16 DIAGNOSIS — C50811 Malignant neoplasm of overlapping sites of right female breast: Secondary | ICD-10-CM

## 2016-06-16 DIAGNOSIS — Z7901 Long term (current) use of anticoagulants: Secondary | ICD-10-CM | POA: Insufficient documentation

## 2016-06-16 DIAGNOSIS — E785 Hyperlipidemia, unspecified: Secondary | ICD-10-CM | POA: Diagnosis not present

## 2016-06-16 DIAGNOSIS — Z87891 Personal history of nicotine dependence: Secondary | ICD-10-CM | POA: Insufficient documentation

## 2016-06-16 DIAGNOSIS — K219 Gastro-esophageal reflux disease without esophagitis: Secondary | ICD-10-CM | POA: Insufficient documentation

## 2016-06-16 DIAGNOSIS — Z923 Personal history of irradiation: Secondary | ICD-10-CM

## 2016-06-16 DIAGNOSIS — K743 Primary biliary cirrhosis: Secondary | ICD-10-CM | POA: Insufficient documentation

## 2016-06-16 DIAGNOSIS — I130 Hypertensive heart and chronic kidney disease with heart failure and stage 1 through stage 4 chronic kidney disease, or unspecified chronic kidney disease: Secondary | ICD-10-CM | POA: Diagnosis not present

## 2016-06-16 DIAGNOSIS — E538 Deficiency of other specified B group vitamins: Secondary | ICD-10-CM | POA: Insufficient documentation

## 2016-06-16 DIAGNOSIS — N183 Chronic kidney disease, stage 3 (moderate): Secondary | ICD-10-CM | POA: Insufficient documentation

## 2016-06-16 LAB — CBC WITH DIFFERENTIAL/PLATELET
Basophils Absolute: 0.1 10*3/uL (ref 0–0.1)
Basophils Relative: 1 %
Eosinophils Absolute: 0.1 10*3/uL (ref 0–0.7)
Eosinophils Relative: 1 %
HEMATOCRIT: 38.6 % (ref 35.0–47.0)
Hemoglobin: 12.7 g/dL (ref 12.0–16.0)
LYMPHS ABS: 0.7 10*3/uL — AB (ref 1.0–3.6)
LYMPHS PCT: 6 %
MCH: 34 pg (ref 26.0–34.0)
MCHC: 33 g/dL (ref 32.0–36.0)
MCV: 103.1 fL — AB (ref 80.0–100.0)
MONO ABS: 0.6 10*3/uL (ref 0.2–0.9)
MONOS PCT: 5 %
NEUTROS ABS: 10.2 10*3/uL — AB (ref 1.4–6.5)
Neutrophils Relative %: 87 %
Platelets: 325 10*3/uL (ref 150–440)
RBC: 3.74 MIL/uL — ABNORMAL LOW (ref 3.80–5.20)
RDW: 16.1 % — AB (ref 11.5–14.5)
WBC: 11.7 10*3/uL — ABNORMAL HIGH (ref 3.6–11.0)

## 2016-06-16 LAB — COMPREHENSIVE METABOLIC PANEL
ALT: 32 U/L (ref 14–54)
ANION GAP: 18 — AB (ref 5–15)
AST: 50 U/L — AB (ref 15–41)
Albumin: 4.2 g/dL (ref 3.5–5.0)
Alkaline Phosphatase: 78 U/L (ref 38–126)
BILIRUBIN TOTAL: 0.8 mg/dL (ref 0.3–1.2)
BUN: 28 mg/dL — ABNORMAL HIGH (ref 6–20)
CALCIUM: 9.4 mg/dL (ref 8.9–10.3)
CO2: 25 mmol/L (ref 22–32)
CREATININE: 1.79 mg/dL — AB (ref 0.44–1.00)
Chloride: 91 mmol/L — ABNORMAL LOW (ref 101–111)
GFR calc Af Amer: 32 mL/min — ABNORMAL LOW (ref >60)
GFR, EST NON AFRICAN AMERICAN: 27 mL/min — AB (ref >60)
GLUCOSE: 105 mg/dL — AB (ref 65–99)
POTASSIUM: 3.9 mmol/L (ref 3.5–5.1)
Sodium: 134 mmol/L — ABNORMAL LOW (ref 135–145)
Total Protein: 7.7 g/dL (ref 6.5–8.1)

## 2016-06-16 LAB — LACTATE DEHYDROGENASE: LDH: 244 U/L — ABNORMAL HIGH (ref 98–192)

## 2016-06-16 LAB — IRON AND TIBC
IRON: 77 ug/dL (ref 28–170)
SATURATION RATIOS: 20 % (ref 10.4–31.8)
TIBC: 382 ug/dL (ref 250–450)
UIBC: 306 ug/dL

## 2016-06-16 LAB — FERRITIN: FERRITIN: 385 ng/mL — AB (ref 11–307)

## 2016-06-16 NOTE — Assessment & Plan Note (Addendum)
#  RIGHT BREAST CA stage I  [2012] ER/PR positive HER-2/neu negative  status post lumpectomy post radiation on aromatase inhibitor. Clinically no evidence of recurrence. Patient tolerating aromatase inhibitor fairly well.  STOP Arimidex now [finished 5 years].   # ANEMIA from iron deficiency- Hb today- 12.7. Stable.  # THROMBOCYTOSIS-long-standing. Unclear etiology.  On hydrea 561m/day 6times/ week. Recommend discontinuation of Hydrea at this time. Plan checking cbc monthly if platelets going up then recommend re-starting Hydrea.  # Afib/RVR- recently admission- on coumadin/ card  # recommend follow up in 3 months/ platelet mammogram prior to revisit. Monthly CBC/BMP prior to visit

## 2016-06-16 NOTE — Progress Notes (Signed)
Pt is unsure whom is following her for her breast cancer.  Recent stay in hospital with per pt had fluid on lungs.  Remains with persistent cough which has improved but will worrisome.  Recent death in family denies need for emotional support from [facility]

## 2016-06-16 NOTE — Progress Notes (Signed)
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  In the interim patient was admitted to the hospital for A. fib with RVR/CHF- she is currently on Coumadin.  Patient denies any unusual worsening arthritic symptoms or hot flashes. Denies any lumps or bumps.  Denies any blood in stools. She is on by mouth iron.  Patient admits to easy bruising as she is on Coumadin. She denies any spontaneous bleeding from the mucosal surfaces. She denies any history of strokes or any prior history of DVT/ PEs.  REVIEW OF SYSTEMS:  A complete 10 point review of system is done which is negative except mentioned above/history of present illness.   PAST MEDICAL HISTORY :  Past Medical History:  Diagnosis Date  . Anemia   . Anxiety   . Atrial fibrillation ([place])   . B12 deficiency   . Breast cancer ([place])    RT Lumpectomy c radiation 2012  . Breast cancer ([place])   . CHF (congestive heart failure) ([place])   . Chronic kidney disease   . Cirrhosis ([place])   . COPD (chronic obstructive pulmonary disease) ([place])   . Depression   . GERD (gastroesophageal  reflux disease)   . Headache   . Heart murmur   . Hyperlipemia   . Hyperlipidemia   . Hypertension   . Iron (Fe) deficiency anemia   . Shortness of breath dyspnea   . Thrombocytopenia ([place])   . Vitamin D deficiency     PAST SURGICAL HISTORY :   Past Surgical History:  Procedure Laterality Date  . ABDOMINAL HYSTERECTOMY     Partial  . ABLATION    . APPENDECTOMY    . BREAST LUMPECTOMY    . BREAST SURGERY    . CARDIAC SURGERY    . COLONOSCOPY  2012  . ELECTROPHYSIOLOGIC STUDY N/A 01/23/2015   Procedure: CARDIOVERSION;  Surgeon: Corey Skains, MD;  Location: [facility] ORS;  Service: Cardiovascular;  Laterality: N/A;  . ELECTROPHYSIOLOGIC STUDY N/A 05/27/2016   Procedure: CARDIOVERSION;  Surgeon: Corey Skains, MD;  Location: [facility] ORS;  Service: Cardiovascular;  Laterality: N/A;  . ESOPHAGOGASTRODUODENOSCOPY  2012  . EYE SURGERY    . MASTECTOMY  01/2011  . MITRAL VALVE REPLACEMENT    . mvp    . VSD REPAIR      FAMILY HISTORY :   Family History  Problem Relation Age of Onset  . Cancer Mother   . Breast cancer Mother   . Cancer Father   . Cancer Brother   . Cancer Daughter   . Breast  cancer Daughter     SOCIAL HISTORY:   Social History  Substance Use Topics  . Smoking status: Former Smoker    Packs/day: 0.25    Years: 30.00    Types: Cigarettes    Quit date: 10/07/1998  . Smokeless tobacco: Never Used  . Alcohol use 2.4 oz/week    4 Cans of beer per week     Comment: socially    ALLERGIES:  is allergic to atorvastatin; calcium carbonate; fluoxetine; and hydrochlorothiazide.  MEDICATIONS:  Current Outpatient Prescriptions  Medication Sig Dispense Refill  . ADVAIR DISKUS 100-50 MCG/DOSE AEPB Inhale 1 puff into the lungs 2 (two) times daily.   0  . alendronate (FOSAMAX) 70 MG tablet Take 70 mg by mouth once a week. Take with a full glass of water on an empty stomach. *Take on Sunday*    . ALPRAZolam (XANAX) 0.5 MG tablet Take 0.5 mg by mouth 2 (two) times daily as  needed for anxiety.    Marland Kitchen anastrozole (ARIMIDEX) 1 MG tablet Take 1 tablet (1 mg total) by mouth daily. 30 tablet 6  . aspirin EC 81 MG tablet Take 81 mg by mouth daily.    Marland Kitchen diltiazem (CARDIZEM SR) 90 MG 12 hr capsule Take 1 capsule (90 mg total) by mouth every 12 (twelve) hours. 60 capsule 0  . Ferrous Sulfate 134 MG TABS Take 1 tablet by mouth 2 (two) times daily.    . furosemide (LASIX) 40 MG tablet Take 40 mg by mouth daily.   1  . hydroxyurea (HYDREA) 500 MG capsule TAKE 1 CAPSULE BY MOUTH 6 TIMES A WEEK 30 capsule 6  . ipratropium-albuterol (DUONEB) 0.5-2.5 (3) MG/3ML SOLN Take 3 mLs by nebulization every 6 (six) hours as needed. 360 mL 0  . levothyroxine (SYNTHROID, LEVOTHROID) 50 MCG tablet Take 50 mcg by mouth daily before breakfast.    . lisinopril (PRINIVIL,ZESTRIL) 5 MG tablet Take 5 mg by mouth daily.   98  . metoprolol succinate (TOPROL-XL) 50 MG 24 hr tablet Take 50 mg by mouth 2 (two) times daily. Take with or immediately following a meal.    . Multiple Vitamins-Minerals (CENTRUM SILVER ULTRA WOMENS) TABS Take 1 tablet by mouth daily.    . potassium chloride SA (K-DUR,KLOR-CON) 20 MEQ tablet take 1 capsule by mouth daily  1  . pravastatin (PRAVACHOL) 20 MG tablet Take 20 mg by mouth at bedtime.    . predniSONE (DELTASONE) 5 MG tablet Take 5 mg by mouth daily with breakfast.    . PROAIR HFA 108 (90 BASE) MCG/ACT inhaler Inhale 2 puffs into the lungs every 4 (four) hours as needed for wheezing or shortness of breath.   0  . Probiotic Product ([place] [employer]) CAPS Take 1 capsule by mouth daily.    . ursodiol (ACTIGALL) 300 MG capsule Take 300 mg by mouth 4 (four) times daily.     . Vitamin D, Ergocalciferol, (DRISDOL) 50000 units CAPS capsule Take 50,000 Units by mouth every 14 (fourteen) days.     Marland Kitchen warfarin (COUMADIN) 2 MG tablet Take 2-4 mg by mouth See admin instructions. Take 1 tablet by mouth every other day, alternating with 2 tablets (4 mg) by mouth every other day.  Patient not sure of what days take the 46m or 438mon  5   No current facility-administered medications for this visit.     PHYSICAL EXAMINATION: ECOG PERFORMANCE STATUS: 1 - Symptomatic but completely ambulatory  BP 137/83 (BP Location: Left Arm, Patient  Position: Sitting)   Temp (!) 96.1 F (35.6 C) (Tympanic)   Resp 18   Ht 5' 2"  (1.575 m)   Wt 188 lb 15 oz (85.7 kg)   BMI 34.56 kg/m   Filed Weights   06/16/16 1035  Weight: 188 lb 15 oz (85.7 kg)    GENERAL: Well-nourished well-developed; Alert, no distress and comfortable.   She is accompanied by her daughter. EYES: no pallor or icterus OROPHARYNX: no thrush or ulceration; good dentition  NECK: supple, no masses felt LYMPH:  no palpable lymphadenopathy in the cervical, axillary or inguinal regions LUNGS: clear to auscultation and  No wheeze or crackles HEART/CVS: regular rate & rhythm and no murmurs; No lower extremity edema ABDOMEN:abdomen soft, non-tender and normal bowel sounds Musculoskeletal:no cyanosis of digits and no clubbing  PSYCH: alert & oriented x 3 with fluent speech NEURO: no focal motor/sensory deficits SKIN:  Multiple bruises noted bilateral extremities [chronic]  LABORATORY DATA:  I have reviewed the data as listed    Component Value Date/Time   NA 134 (L) 06/16/2016 1016   NA 129 (L) 12/30/2014 0921   K 3.9 06/16/2016 1016   K 4.7 12/30/2014 0921   CL 91 (L) 06/16/2016 1016   CL 91 (L) 12/30/2014 0921   CO2 25 06/16/2016 1016   CO2 26 12/30/2014 0921   GLUCOSE 105 (H) 06/16/2016 1016   GLUCOSE 103 (H) 12/30/2014 0921   BUN 28 (H) 06/16/2016 1016   BUN 22 (H) 12/30/2014 0921   CREATININE 1.79 (H) 06/16/2016 1016   CREATININE 1.22 (H) 12/30/2014 0921   CALCIUM 9.4 06/16/2016 1016   CALCIUM 9.1 12/30/2014 0921   PROT 7.7 06/16/2016 1016   PROT 7.9 12/30/2014 0921   ALBUMIN 4.2 06/16/2016 1016   ALBUMIN 4.0 12/30/2014 0921   AST 50 (H) 06/16/2016 1016   AST 31 12/30/2014 0921   ALT 32  06/16/2016 1016   ALT 29 12/30/2014 0921   ALKPHOS 78 06/16/2016 1016   ALKPHOS 101 12/30/2014 0921   BILITOT 0.8 06/16/2016 1016   BILITOT 0.5 12/30/2014 0921   GFRNONAA 27 (L) 06/16/2016 1016   GFRNONAA 45 (L) 12/30/2014 0921   GFRAA 32 (L) 06/16/2016 1016   GFRAA 52 (L) 12/30/2014 0921    No results found for: SPEP, UPEP  Lab Results  Component Value Date   WBC 11.7 (H) 06/16/2016   NEUTROABS 10.2 (H) 06/16/2016   HGB 12.7 06/16/2016   HCT 38.6 06/16/2016   MCV 103.1 (H) 06/16/2016   PLT 325 06/16/2016      Chemistry      Component Value Date/Time   NA 134 (L) 06/16/2016 1016   NA 129 (L) 12/30/2014 0921   K 3.9 06/16/2016 1016   K 4.7 12/30/2014 0921   CL 91 (L) 06/16/2016 1016   CL 91 (L) 12/30/2014 0921   CO2 25 06/16/2016 1016   CO2 26 12/30/2014 0921   BUN 28 (H) 06/16/2016 1016   BUN 22 (H) 12/30/2014 0921   CREATININE 1.79 (H) 06/16/2016 1016   CREATININE 1.22 (H) 12/30/2014 0921      Component Value Date/Time   CALCIUM 9.4 06/16/2016 1016   CALCIUM 9.1 12/30/2014 0921   ALKPHOS 78 06/16/2016 1016   ALKPHOS 101 12/30/2014 0921   AST 50 (H) 06/16/2016 1016   AST 31 12/30/2014 0921   ALT 32 06/16/2016 1016   ALT 29 12/30/2014 0921   BILITOT 0.8 06/16/2016 1016   BILITOT 0.5 12/30/2014 7169  RADIOGRAPHIC STUDIES: I have personally reviewed the radiological images as listed and agreed with the findings in the report. No results found.   ASSESSMENT & PLAN:  Cancer of overlapping sites of right female breast ([place])  # RIGHT BREAST CA stage I  [2012] ER/PR positive HER-2/neu negative  status post lumpectomy post radiation on aromatase inhibitor. Clinically no evidence of recurrence. Patient tolerating aromatase inhibitor fairly well.  STOP Arimidex now [finished 5 years].   # ANEMIA from iron deficiency- Hb today- 12.7. Stable.  # THROMBOCYTOSIS-long-standing. Unclear etiology.  On hydrea 57m/day 6times/ week. Recommend discontinuation of  Hydrea at this time. Plan checking cbc monthly if platelets going up then recommend re-starting Hydrea.  # Afib/RVR- recently admission- on coumadin/ card  # recommend follow up in 3 months/ platelet mammogram prior to revisit. Monthly CBC/BMP prior to visit    GCammie Sickle MD 06/16/2016 1:30 PM

## 2016-06-26 ENCOUNTER — Encounter: Payer: Self-pay | Admitting: Family

## 2016-06-26 ENCOUNTER — Ambulatory Visit: Payer: Medicare Other | Admitting: Family

## 2016-06-26 ENCOUNTER — Telehealth: Payer: Self-pay | Admitting: Family

## 2016-06-26 NOTE — Telephone Encounter (Signed)
Patient missed her initial appointment at the [facility] on 06/26/16. Will attempt to reschedule.

## 2016-07-14 ENCOUNTER — Inpatient Hospital Stay: Payer: Medicare Other | Attending: Internal Medicine

## 2016-07-14 ENCOUNTER — Inpatient Hospital Stay: Payer: Medicare Other | Admitting: Internal Medicine

## 2016-08-06 ENCOUNTER — Ambulatory Visit: Payer: Medicare Other

## 2016-08-06 ENCOUNTER — Other Ambulatory Visit: Payer: Medicare Other

## 2016-08-18 ENCOUNTER — Inpatient Hospital Stay: Payer: Medicare Other | Attending: Internal Medicine

## 2016-09-08 ENCOUNTER — Inpatient Hospital Stay (HOSPITAL_BASED_OUTPATIENT_CLINIC_OR_DEPARTMENT_OTHER): Payer: Medicare Other | Admitting: Internal Medicine

## 2016-09-08 ENCOUNTER — Inpatient Hospital Stay: Payer: Medicare Other | Attending: Internal Medicine

## 2016-09-08 VITALS — BP 102/68 | HR 93 | Temp 98.4°F | Resp 18 | Wt 191.6 lb

## 2016-09-08 DIAGNOSIS — Z923 Personal history of irradiation: Secondary | ICD-10-CM

## 2016-09-08 DIAGNOSIS — F419 Anxiety disorder, unspecified: Secondary | ICD-10-CM | POA: Diagnosis not present

## 2016-09-08 DIAGNOSIS — F329 Major depressive disorder, single episode, unspecified: Secondary | ICD-10-CM | POA: Diagnosis not present

## 2016-09-08 DIAGNOSIS — Z7982 Long term (current) use of aspirin: Secondary | ICD-10-CM | POA: Diagnosis not present

## 2016-09-08 DIAGNOSIS — Z170 Estrogen receptor positive status [ER+]: Secondary | ICD-10-CM | POA: Insufficient documentation

## 2016-09-08 DIAGNOSIS — I130 Hypertensive heart and chronic kidney disease with heart failure and stage 1 through stage 4 chronic kidney disease, or unspecified chronic kidney disease: Secondary | ICD-10-CM | POA: Insufficient documentation

## 2016-09-08 DIAGNOSIS — Z950 Presence of cardiac pacemaker: Secondary | ICD-10-CM | POA: Insufficient documentation

## 2016-09-08 DIAGNOSIS — C50811 Malignant neoplasm of overlapping sites of right female breast: Secondary | ICD-10-CM

## 2016-09-08 DIAGNOSIS — Z79899 Other long term (current) drug therapy: Secondary | ICD-10-CM | POA: Diagnosis not present

## 2016-09-08 DIAGNOSIS — I509 Heart failure, unspecified: Secondary | ICD-10-CM | POA: Insufficient documentation

## 2016-09-08 DIAGNOSIS — J449 Chronic obstructive pulmonary disease, unspecified: Secondary | ICD-10-CM | POA: Insufficient documentation

## 2016-09-08 DIAGNOSIS — D473 Essential (hemorrhagic) thrombocythemia: Secondary | ICD-10-CM | POA: Insufficient documentation

## 2016-09-08 DIAGNOSIS — E559 Vitamin D deficiency, unspecified: Secondary | ICD-10-CM | POA: Diagnosis not present

## 2016-09-08 DIAGNOSIS — D509 Iron deficiency anemia, unspecified: Secondary | ICD-10-CM | POA: Diagnosis not present

## 2016-09-08 DIAGNOSIS — N183 Chronic kidney disease, stage 3 (moderate): Secondary | ICD-10-CM | POA: Diagnosis not present

## 2016-09-08 DIAGNOSIS — I4891 Unspecified atrial fibrillation: Secondary | ICD-10-CM | POA: Insufficient documentation

## 2016-09-08 DIAGNOSIS — Z87891 Personal history of nicotine dependence: Secondary | ICD-10-CM | POA: Diagnosis not present

## 2016-09-08 DIAGNOSIS — E785 Hyperlipidemia, unspecified: Secondary | ICD-10-CM | POA: Insufficient documentation

## 2016-09-08 DIAGNOSIS — K219 Gastro-esophageal reflux disease without esophagitis: Secondary | ICD-10-CM | POA: Diagnosis not present

## 2016-09-08 DIAGNOSIS — Z7901 Long term (current) use of anticoagulants: Secondary | ICD-10-CM

## 2016-09-08 LAB — BASIC METABOLIC PANEL
Anion gap: 14 (ref 5–15)
BUN: 24 mg/dL — AB (ref 6–20)
CHLORIDE: 94 mmol/L — AB (ref 101–111)
CO2: 27 mmol/L (ref 22–32)
Calcium: 9.7 mg/dL (ref 8.9–10.3)
Creatinine, Ser: 1.51 mg/dL — ABNORMAL HIGH (ref 0.44–1.00)
GFR calc Af Amer: 39 mL/min — ABNORMAL LOW (ref >60)
GFR calc non Af Amer: 34 mL/min — ABNORMAL LOW (ref >60)
GLUCOSE: 112 mg/dL — AB (ref 65–99)
POTASSIUM: 5.2 mmol/L — AB (ref 3.5–5.1)
Sodium: 135 mmol/L (ref 135–145)

## 2016-09-08 LAB — CBC WITH DIFFERENTIAL/PLATELET
Basophils Absolute: 0.1 10*3/uL (ref 0–0.1)
Basophils Relative: 1 %
Eosinophils Absolute: 0.1 10*3/uL (ref 0–0.7)
Eosinophils Relative: 0 %
HCT: 40.9 % (ref 35.0–47.0)
HEMOGLOBIN: 13.7 g/dL (ref 12.0–16.0)
LYMPHS ABS: 0.7 10*3/uL — AB (ref 1.0–3.6)
LYMPHS PCT: 6 %
MCH: 32.8 pg (ref 26.0–34.0)
MCHC: 33.5 g/dL (ref 32.0–36.0)
MCV: 97.8 fL (ref 80.0–100.0)
MONOS PCT: 7 %
Monocytes Absolute: 0.9 10*3/uL (ref 0.2–0.9)
NEUTROS PCT: 86 %
Neutro Abs: 11.3 10*3/uL — ABNORMAL HIGH (ref 1.4–6.5)
Platelets: 333 10*3/uL (ref 150–440)
RBC: 4.19 MIL/uL (ref 3.80–5.20)
RDW: 15.4 % — ABNORMAL HIGH (ref 11.5–14.5)
WBC: 13.1 10*3/uL — AB (ref 3.6–11.0)

## 2016-09-08 NOTE — Assessment & Plan Note (Signed)
#  RIGHT BREAST CA stage I  [2012] ER/PR positive HER-2/neu negative  status post lumpectomy post radiation on aromatase inhibitor. Clinically no evidence of recurrence. Patient tolerating aromatase inhibitor fairly well.  STOP Arimidex now [finished 5 years].  Recommend getting mammograms when she can [had to hold because of recent pacemake surgery]  # ANEMIA from iron deficiency- Hb today- 13.   # HISTORY OF THROMBOCYTOSIS-long-standing. Unclear etiology.  currently off hydrea x 3 months; platelets are normal. Continue off Hydrea.  # Afib/RVR- s/p recent ppm- on coumadin/ card  # Follow-up in 6 months with labs and studies.

## 2016-09-08 NOTE — Progress Notes (Signed)
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    patient in the  Interim had cardiac pacemaker placed.  Patient admits to easy bruising as she is on Coumadin. She denies any spontaneous bleeding from the mucosal surfaces. She denies any history of strokes or any prior history of DVT/ PEs.   REVIEW OF SYSTEMS:  A complete 10 point review of system is done which is negative except mentioned above/history of present illness.   PAST MEDICAL HISTORY :  Past Medical History:  Diagnosis Date  . Anemia   . Anxiety   . Atrial fibrillation ([place])   . B12 deficiency   . Breast cancer ([place])    RT Lumpectomy c radiation 2012  . Breast cancer ([place])   . CHF (congestive heart failure) ([place])   . Chronic kidney disease   . Cirrhosis ([place])   . COPD (chronic obstructive pulmonary disease) ([place])   . Depression   . GERD (gastroesophageal reflux disease)   . Headache   . Heart murmur   . Hyperlipemia   . Hyperlipidemia   .  Hypertension   . Iron (Fe) deficiency anemia   . Shortness of breath dyspnea   . Thrombocytopenia ([place])   . Vitamin D deficiency     PAST SURGICAL HISTORY :   Past Surgical History:  Procedure Laterality Date  . ABDOMINAL HYSTERECTOMY     Partial  . ABLATION    . APPENDECTOMY    . BREAST LUMPECTOMY    . BREAST SURGERY    . CARDIAC SURGERY    . COLONOSCOPY  2012  . ELECTROPHYSIOLOGIC STUDY N/A 01/23/2015   Procedure: CARDIOVERSION;  Surgeon: Corey Skains, MD;  Location: [facility] ORS;  Service: Cardiovascular;  Laterality: N/A;  . ELECTROPHYSIOLOGIC STUDY N/A 05/27/2016   Procedure: CARDIOVERSION;  Surgeon: Corey Skains, MD;  Location: [facility] ORS;  Service: Cardiovascular;  Laterality: N/A;  . ESOPHAGOGASTRODUODENOSCOPY  2012  . EYE SURGERY    . MASTECTOMY  01/2011  . MITRAL VALVE REPLACEMENT    . mvp    . VSD REPAIR      FAMILY HISTORY :   Family History  Problem Relation Age of Onset  . Cancer Mother   . Breast cancer Mother   . Cancer Father   . Cancer Brother   . Cancer Daughter   . Breast cancer Daughter     SOCIAL HISTORY:   Social History  Substance Use Topics  .  Smoking status: Former Smoker    Packs/day: 0.25    Years: 30.00    Types: Cigarettes    Quit date: 10/07/1998  . Smokeless tobacco: Never Used  . Alcohol use 2.4 oz/week    4 Cans of beer per week     Comment: socially    ALLERGIES:  is allergic to atorvastatin; calcium carbonate; fluoxetine; and hydrochlorothiazide.  MEDICATIONS:  Current Outpatient Prescriptions  Medication Sig Dispense Refill  . ADVAIR DISKUS 100-50 MCG/DOSE AEPB Inhale 1 puff into the lungs 2 (two) times daily.   0  . alendronate (FOSAMAX) 70 MG tablet Take 70 mg by mouth once a week. Take with a full glass of water on an empty stomach. *Take on Sunday*    . ALPRAZolam (XANAX) 0.5 MG tablet Take 0.5 mg by mouth 2 (two) times daily as needed for anxiety.    Marland Kitchen aspirin EC 81 MG tablet Take 81 mg by mouth daily.    Marland Kitchen  diltiazem (CARDIZEM SR) 90 MG 12 hr capsule Take 1 capsule (90 mg total) by mouth every 12 (twelve) hours. 60 capsule 0  . Ferrous Sulfate 134 MG TABS Take 1 tablet by mouth 2 (two) times daily.    . furosemide (LASIX) 40 MG tablet Take 40 mg by mouth daily.   1  . ipratropium-albuterol (DUONEB) 0.5-2.5 (3) MG/3ML SOLN Take 3 mLs by nebulization every 6 (six) hours as needed. 360 mL 0  . levothyroxine (SYNTHROID, LEVOTHROID) 50 MCG tablet Take 50 mcg by mouth daily before breakfast.    . metoprolol succinate (TOPROL-XL) 50 MG 24 hr tablet Take 50 mg by mouth 2 (two) times daily. Take with or immediately following a meal.    . Multiple Vitamins-Minerals (CENTRUM SILVER ULTRA WOMENS) TABS Take 1 tablet by mouth daily.    . potassium chloride SA (K-DUR,KLOR-CON) 20 MEQ tablet take 1 capsule by mouth daily  1  . pravastatin (PRAVACHOL) 20 MG tablet Take 20 mg by mouth at bedtime.    . predniSONE (DELTASONE) 5 MG tablet Take 5 mg by mouth daily with breakfast.    . PROAIR HFA 108 (90 BASE) MCG/ACT inhaler Inhale 2 puffs into the lungs every 4 (four) hours as needed for wheezing or shortness of breath.   0  . Probiotic Product (PHILLIPS COLON HEALTH) CAPS Take 1 capsule by mouth daily.    . ursodiol (ACTIGALL) 300 MG capsule Take 300 mg by mouth 4 (four) times daily.     . Vitamin D, Ergocalciferol, (DRISDOL) 50000 units CAPS capsule Take 50,000 Units by mouth every 14 (fourteen) days.     Marland Kitchen warfarin (COUMADIN) 2 MG tablet Take 2-4 mg by mouth See admin instructions. Take 1 tablet by mouth every other day, alternating with 2 tablets (4 mg) by mouth every other day. Patient not sure of what days take the 8m or 436mon  5  . anastrozole (ARIMIDEX) 1 MG tablet Take 1 tablet (1 mg total) by mouth daily. (Patient not taking: Reported on 09/08/2016) 30 tablet 6  . hydroxyurea (HYDREA) 500 MG capsule TAKE 1 CAPSULE BY MOUTH 6 TIMES A WEEK (Patient not taking: Reported on 09/08/2016) 30 capsule 6  . lisinopril  (PRINIVIL,ZESTRIL) 5 MG tablet Take 5 mg by mouth daily.   98   No current facility-administered medications for this visit.     PHYSICAL EXAMINATION: ECOG PERFORMANCE STATUS: 1 - Symptomatic but completely ambulatory  BP 102/68 (BP Location: Left Arm, Patient Position: Sitting)   Pulse 93  Temp 98.4 F (36.9 C) (Tympanic)   Resp 18   Wt 191 lb 9.3 oz (86.9 kg)   BMI 35.04 kg/m   Filed Weights   09/08/16 1138  Weight: 191 lb 9.3 oz (86.9 kg)    GENERAL: Well-nourished well-developed; Alert, no distress and comfortable.   She is accompanied by her daughter. EYES: no pallor or icterus OROPHARYNX: no thrush or ulceration; good dentition  NECK: supple, no masses felt LYMPH:  no palpable lymphadenopathy in the cervical, axillary or inguinal regions LUNGS: clear to auscultation and  No wheeze or crackles HEART/CVS: regular rate & rhythm and no murmurs; No lower extremity edema ABDOMEN:abdomen soft, non-tender and normal bowel sounds Musculoskeletal:no cyanosis of digits and no clubbing  PSYCH: alert & oriented x 3 with fluent speech NEURO: no focal motor/sensory deficits SKIN:  Multiple bruises noted bilateral extremities [chronic]  LABORATORY DATA:  I have reviewed the data as listed    Component Value Date/Time   NA 135 09/08/2016 1111   NA 129 (L) 12/30/2014 0921   K 5.2 (H) 09/08/2016 1111   K 4.7 12/30/2014 0921   CL 94 (L) 09/08/2016 1111   CL 91 (L) 12/30/2014 0921   CO2 27 09/08/2016 1111   CO2 26 12/30/2014 0921   GLUCOSE 112 (H) 09/08/2016 1111   GLUCOSE 103 (H) 12/30/2014 0921   BUN 24 (H) 09/08/2016 1111   BUN 22 (H) 12/30/2014 0921   CREATININE 1.51 (H) 09/08/2016 1111   CREATININE 1.22 (H) 12/30/2014 0921   CALCIUM 9.7 09/08/2016 1111   CALCIUM 9.1 12/30/2014 0921   PROT 7.7 06/16/2016 1016   PROT 7.9 12/30/2014 0921   ALBUMIN 4.2 06/16/2016 1016   ALBUMIN 4.0 12/30/2014 0921   AST 50 (H) 06/16/2016 1016   AST 31 12/30/2014 0921   ALT 32  06/16/2016 1016   ALT 29 12/30/2014 0921   ALKPHOS 78 06/16/2016 1016   ALKPHOS 101 12/30/2014 0921   BILITOT 0.8 06/16/2016 1016   BILITOT 0.5 12/30/2014 0921   GFRNONAA 34 (L) 09/08/2016 1111   GFRNONAA 45 (L) 12/30/2014 0921   GFRAA 39 (L) 09/08/2016 1111   GFRAA 52 (L) 12/30/2014 0921    No results found for: SPEP, UPEP  Lab Results  Component Value Date   WBC 13.1 (H) 09/08/2016   NEUTROABS 11.3 (H) 09/08/2016   HGB 13.7 09/08/2016   HCT 40.9 09/08/2016   MCV 97.8 09/08/2016   PLT 333 09/08/2016      Chemistry      Component Value Date/Time   NA 135 09/08/2016 1111   NA 129 (L) 12/30/2014 0921   K 5.2 (H) 09/08/2016 1111   K 4.7 12/30/2014 0921   CL 94 (L) 09/08/2016 1111   CL 91 (L) 12/30/2014 0921   CO2 27 09/08/2016 1111   CO2 26 12/30/2014 0921   BUN 24 (H) 09/08/2016 1111   BUN 22 (H) 12/30/2014 0921   CREATININE 1.51 (H) 09/08/2016 1111   CREATININE 1.22 (H) 12/30/2014 0921      Component Value Date/Time   CALCIUM 9.7 09/08/2016 1111   CALCIUM 9.1 12/30/2014 0921   ALKPHOS 78 06/16/2016 1016   ALKPHOS 101 12/30/2014 0921   AST 50 (H) 06/16/2016 1016   AST 31 12/30/2014 0921   ALT 32 06/16/2016 1016   ALT 29 12/30/2014 0921   BILITOT 0.8 06/16/2016 1016   BILITOT 0.5 12/30/2014 0921       RADIOGRAPHIC STUDIES: I have personally reviewed the radiological images as  listed and agreed with the findings in the report. No results found.   ASSESSMENT & PLAN:  Carcinoma of overlapping sites of right breast in female, estrogen receptor positive ([place])  # RIGHT BREAST CA stage I  [2012] ER/PR positive HER-2/neu negative  status post lumpectomy post radiation on aromatase inhibitor. Clinically no evidence of recurrence. Patient tolerating aromatase inhibitor fairly well.  STOP Arimidex now [finished 5 years].  Recommend getting mammograms when she can [had to hold because of recent pacemake surgery]  # ANEMIA from iron deficiency- Hb today- 13.   #  HISTORY OF THROMBOCYTOSIS-long-standing. Unclear etiology.  currently off hydrea x 3 months; platelets are normal. Continue off Hydrea.  # Afib/RVR- s/p recent ppm- on coumadin/ card  # Follow-up in 6 months with labs and studies.     Cammie Sickle, MD 09/08/2016 1:45 PM

## 2016-11-18 ENCOUNTER — Other Ambulatory Visit: Payer: Self-pay | Admitting: Gastroenterology

## 2016-11-18 DIAGNOSIS — K743 Primary biliary cirrhosis: Secondary | ICD-10-CM

## 2016-11-24 ENCOUNTER — Ambulatory Visit
Admission: RE | Admit: 2016-11-24 | Discharge: 2016-11-24 | Disposition: A | Discharge status: Home / Self Care | Payer: Medicare Other | Source: Ambulatory Visit | Attending: Gastroenterology | Admitting: Gastroenterology

## 2016-11-24 DIAGNOSIS — K743 Primary biliary cirrhosis: Secondary | ICD-10-CM | POA: Diagnosis not present

## 2016-12-14 ENCOUNTER — Telehealth: Payer: Self-pay | Admitting: *Deleted

## 2016-12-14 ENCOUNTER — Other Ambulatory Visit: Payer: Self-pay | Admitting: *Deleted

## 2016-12-14 DIAGNOSIS — Z170 Estrogen receptor positive status [ER+]: Principal | ICD-10-CM

## 2016-12-14 DIAGNOSIS — C50811 Malignant neoplasm of overlapping sites of right female breast: Secondary | ICD-10-CM

## 2016-12-14 NOTE — Telephone Encounter (Signed)
patient contacted cancer center. Requesting to sch. Her mammo. Reviewed chart- order previously entered expired in Jan. Pt will need dx bilateral mammo and axil. u/s limited axillary-bilateral scheduled in [place]. Orders entered. Cosign pending by Dr. [name]

## 2016-12-31 ENCOUNTER — Other Ambulatory Visit: Payer: Medicare Other

## 2017-01-05 ENCOUNTER — Ambulatory Visit
Admission: RE | Admit: 2017-01-05 | Discharge: 2017-01-05 | Disposition: A | Discharge status: Home / Self Care | Payer: Medicare Other | Source: Ambulatory Visit | Attending: Internal Medicine | Admitting: Internal Medicine

## 2017-01-05 DIAGNOSIS — C50811 Malignant neoplasm of overlapping sites of right female breast: Secondary | ICD-10-CM

## 2017-01-05 DIAGNOSIS — Z170 Estrogen receptor positive status [ER+]: Principal | ICD-10-CM

## 2017-03-09 ENCOUNTER — Inpatient Hospital Stay: Payer: Medicare Other | Attending: Internal Medicine

## 2017-03-09 DIAGNOSIS — Z7901 Long term (current) use of anticoagulants: Secondary | ICD-10-CM | POA: Insufficient documentation

## 2017-03-09 DIAGNOSIS — E559 Vitamin D deficiency, unspecified: Secondary | ICD-10-CM | POA: Diagnosis not present

## 2017-03-09 DIAGNOSIS — F419 Anxiety disorder, unspecified: Secondary | ICD-10-CM | POA: Diagnosis not present

## 2017-03-09 DIAGNOSIS — Z923 Personal history of irradiation: Secondary | ICD-10-CM | POA: Insufficient documentation

## 2017-03-09 DIAGNOSIS — Z9223 Personal history of estrogen therapy: Secondary | ICD-10-CM | POA: Insufficient documentation

## 2017-03-09 DIAGNOSIS — K743 Primary biliary cirrhosis: Secondary | ICD-10-CM | POA: Insufficient documentation

## 2017-03-09 DIAGNOSIS — I4891 Unspecified atrial fibrillation: Secondary | ICD-10-CM | POA: Diagnosis not present

## 2017-03-09 DIAGNOSIS — Z170 Estrogen receptor positive status [ER+]: Secondary | ICD-10-CM | POA: Insufficient documentation

## 2017-03-09 DIAGNOSIS — F329 Major depressive disorder, single episode, unspecified: Secondary | ICD-10-CM | POA: Diagnosis not present

## 2017-03-09 DIAGNOSIS — I509 Heart failure, unspecified: Secondary | ICD-10-CM | POA: Diagnosis not present

## 2017-03-09 DIAGNOSIS — Z79899 Other long term (current) drug therapy: Secondary | ICD-10-CM | POA: Insufficient documentation

## 2017-03-09 DIAGNOSIS — D509 Iron deficiency anemia, unspecified: Secondary | ICD-10-CM | POA: Diagnosis not present

## 2017-03-09 DIAGNOSIS — Z7982 Long term (current) use of aspirin: Secondary | ICD-10-CM | POA: Diagnosis not present

## 2017-03-09 DIAGNOSIS — I130 Hypertensive heart and chronic kidney disease with heart failure and stage 1 through stage 4 chronic kidney disease, or unspecified chronic kidney disease: Secondary | ICD-10-CM | POA: Insufficient documentation

## 2017-03-09 DIAGNOSIS — E785 Hyperlipidemia, unspecified: Secondary | ICD-10-CM | POA: Diagnosis not present

## 2017-03-09 DIAGNOSIS — Z853 Personal history of malignant neoplasm of breast: Secondary | ICD-10-CM | POA: Diagnosis not present

## 2017-03-09 DIAGNOSIS — E538 Deficiency of other specified B group vitamins: Secondary | ICD-10-CM | POA: Insufficient documentation

## 2017-03-09 DIAGNOSIS — K219 Gastro-esophageal reflux disease without esophagitis: Secondary | ICD-10-CM | POA: Insufficient documentation

## 2017-03-09 DIAGNOSIS — Z87891 Personal history of nicotine dependence: Secondary | ICD-10-CM | POA: Diagnosis not present

## 2017-03-09 DIAGNOSIS — C50811 Malignant neoplasm of overlapping sites of right female breast: Secondary | ICD-10-CM

## 2017-03-09 DIAGNOSIS — N183 Chronic kidney disease, stage 3 (moderate): Secondary | ICD-10-CM | POA: Diagnosis not present

## 2017-03-09 DIAGNOSIS — J449 Chronic obstructive pulmonary disease, unspecified: Secondary | ICD-10-CM | POA: Diagnosis not present

## 2017-03-09 LAB — CBC WITH DIFFERENTIAL/PLATELET
BASOS PCT: 1 %
Basophils Absolute: 0.1 10*3/uL (ref 0–0.1)
Eosinophils Absolute: 0 10*3/uL (ref 0–0.7)
Eosinophils Relative: 1 %
HEMATOCRIT: 40.7 % (ref 35.0–47.0)
Hemoglobin: 13.8 g/dL (ref 12.0–16.0)
Lymphocytes Relative: 4 %
Lymphs Abs: 0.4 10*3/uL — ABNORMAL LOW (ref 1.0–3.6)
MCH: 30.7 pg (ref 26.0–34.0)
MCHC: 33.8 g/dL (ref 32.0–36.0)
MCV: 90.9 fL (ref 80.0–100.0)
MONO ABS: 0.6 10*3/uL (ref 0.2–0.9)
MONOS PCT: 6 %
NEUTROS ABS: 8.8 10*3/uL — AB (ref 1.4–6.5)
Neutrophils Relative %: 88 %
Platelets: 403 10*3/uL (ref 150–440)
RBC: 4.48 MIL/uL (ref 3.80–5.20)
RDW: 16 % — AB (ref 11.5–14.5)
WBC: 9.9 10*3/uL (ref 3.6–11.0)

## 2017-03-09 LAB — COMPREHENSIVE METABOLIC PANEL
ALBUMIN: 3.4 g/dL — AB (ref 3.5–5.0)
ALT: 20 U/L (ref 14–54)
ANION GAP: 13 (ref 5–15)
AST: 39 U/L (ref 15–41)
Alkaline Phosphatase: 103 U/L (ref 38–126)
BILIRUBIN TOTAL: 0.9 mg/dL (ref 0.3–1.2)
BUN: 18 mg/dL (ref 6–20)
CO2: 25 mmol/L (ref 22–32)
Calcium: 9.3 mg/dL (ref 8.9–10.3)
Chloride: 93 mmol/L — ABNORMAL LOW (ref 101–111)
Creatinine, Ser: 1.56 mg/dL — ABNORMAL HIGH (ref 0.44–1.00)
GFR calc Af Amer: 37 mL/min — ABNORMAL LOW (ref >60)
GFR, EST NON AFRICAN AMERICAN: 32 mL/min — AB (ref >60)
GLUCOSE: 109 mg/dL — AB (ref 65–99)
POTASSIUM: 4 mmol/L (ref 3.5–5.1)
Sodium: 131 mmol/L — ABNORMAL LOW (ref 135–145)
TOTAL PROTEIN: 7.4 g/dL (ref 6.5–8.1)

## 2017-03-09 LAB — IRON AND TIBC
IRON: 43 ug/dL (ref 28–170)
SATURATION RATIOS: 17 % (ref 10.4–31.8)
TIBC: 249 ug/dL — AB (ref 250–450)
UIBC: 206 ug/dL

## 2017-03-09 LAB — FERRITIN: Ferritin: 609 ng/mL — ABNORMAL HIGH (ref 11–307)

## 2017-03-16 ENCOUNTER — Ambulatory Visit: Payer: Medicare Other | Admitting: Internal Medicine

## 2017-03-16 ENCOUNTER — Inpatient Hospital Stay (HOSPITAL_BASED_OUTPATIENT_CLINIC_OR_DEPARTMENT_OTHER): Payer: Medicare Other | Admitting: Internal Medicine

## 2017-03-16 VITALS — BP 122/82 | HR 99 | Temp 98.1°F | Resp 18 | Ht 62.0 in | Wt 196.4 lb

## 2017-03-16 DIAGNOSIS — Z923 Personal history of irradiation: Secondary | ICD-10-CM

## 2017-03-16 DIAGNOSIS — C50811 Malignant neoplasm of overlapping sites of right female breast: Secondary | ICD-10-CM

## 2017-03-16 DIAGNOSIS — Z9223 Personal history of estrogen therapy: Secondary | ICD-10-CM | POA: Diagnosis not present

## 2017-03-16 DIAGNOSIS — Z79899 Other long term (current) drug therapy: Secondary | ICD-10-CM

## 2017-03-16 DIAGNOSIS — Z170 Estrogen receptor positive status [ER+]: Secondary | ICD-10-CM

## 2017-03-16 DIAGNOSIS — Z853 Personal history of malignant neoplasm of breast: Secondary | ICD-10-CM

## 2017-03-16 DIAGNOSIS — D509 Iron deficiency anemia, unspecified: Secondary | ICD-10-CM

## 2017-03-16 NOTE — Assessment & Plan Note (Addendum)
#  RIGHT BREAST CA stage I  [2012] ER/PR positive HER-2/neu negative  status post lumpectomy post radiation on aromatase inhibitor [stopped 2017 dec; x 5 years]. Clinically no evidence of recurrence.mammo- April 2018- WNL.   # ANEMIA from iron deficiency/CKD- continue PO iron- Hb today- 13.   # HISTORY OF THROMBOCYTOSIS-long-standing.likley reactive [off hydrea x last 6 month]. Monitor for now.  # Afib/RVR- s/p recent ppm- on coumadin/ card  # Follow-up in 12 months with labs iron studies; mammogram in April 2019.   CC; Dr.Thies.

## 2017-03-16 NOTE — Progress Notes (Signed)
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      Carcinoma of overlapping sites of right breast in female, estrogen receptor positive ([place])   06/16/2016 Initial Diagnosis    Cancer of overlapping sites of right female breast [facility])       INTERVAL HISTORY:  A very pleasant 71 year old female patient with above history of chronic thrombocytosis on Hydrea [ discontinued at lat visi] and also history of stage I breast cancer is here for follow-up.   Patient continues to admit to easy bruising as she is on Coumadin. She denies any spontaneous bleeding from the mucosal surfaces. She denies any history of strokes or any prior history of DVT/ PEs. She has been taken off Arimidex at last visit 6 months ago; and she is also off Hydrea.  REVIEW OF SYSTEMS:  A complete 10 point review of system is done which is negative except mentioned above/history of present illness.   PAST MEDICAL HISTORY :  Past Medical History:  Diagnosis Date  . Anemia   . Anxiety   . Atrial fibrillation ([place])   . B12 deficiency   . Breast cancer ([place])    RT Lumpectomy c radiation 2012  . Breast cancer ([place])   . CHF (congestive heart failure) ([place])   . Chronic kidney disease   . Cirrhosis ([place])   . COPD (chronic obstructive pulmonary disease) ([place])   . Depression   . GERD (gastroesophageal reflux disease)   . Headache   . Heart murmur   . Hyperlipemia   . Hyperlipidemia   . Hypertension   . Iron (Fe) deficiency anemia    . Shortness of breath dyspnea   . Thrombocytopenia ([place])   . Vitamin D deficiency     PAST SURGICAL HISTORY :   Past Surgical History:  Procedure Laterality Date  . ABDOMINAL HYSTERECTOMY     Partial  . ABLATION    . APPENDECTOMY    . BREAST BIOPSY Right 2012  . BREAST LUMPECTOMY Right 2012  . BREAST SURGERY    . CARDIAC SURGERY    . COLONOSCOPY  2012  . ELECTROPHYSIOLOGIC STUDY N/A 01/23/2015   Procedure: CARDIOVERSION;  Surgeon: Corey Skains, MD;  Location: [facility] ORS;  Service: Cardiovascular;  Laterality: N/A;  . ELECTROPHYSIOLOGIC STUDY N/A 05/27/2016   Procedure: CARDIOVERSION;  Surgeon: Corey Skains, MD;  Location: [facility] ORS;  Service: Cardiovascular;  Laterality: N/A;  . ESOPHAGOGASTRODUODENOSCOPY  2012  . EYE SURGERY    . MITRAL VALVE REPLACEMENT    . mvp    . VSD REPAIR      FAMILY HISTORY :   Family History  Problem Relation Age of Onset  . Cancer Mother   . Breast cancer Mother   . Cancer Father   . Cancer Brother   . Cancer Daughter   . Breast cancer Daughter     SOCIAL HISTORY:   Social History  Substance Use Topics  . Smoking status: Former Smoker    Packs/day:  0.25    Years: 30.00    Types: Cigarettes    Quit date: 10/07/1998  . Smokeless tobacco: Never Used  . Alcohol use 2.4 oz/week    4 Cans of beer per week     Comment: socially    ALLERGIES:  is allergic to atorvastatin; calcium carbonate; fluoxetine; and hydrochlorothiazide.  MEDICATIONS:  Current Outpatient Prescriptions  Medication Sig Dispense Refill  . ADVAIR DISKUS 100-50 MCG/DOSE AEPB Inhale 1 puff into the lungs 2 (two) times daily.   0  . alendronate (FOSAMAX) 70 MG tablet Take 70 mg by mouth once a week. Take with a full glass of water on an empty stomach. *Take on Sunday*    . ALPRAZolam (XANAX) 0.5 MG tablet Take 0.5 mg by mouth 2 (two) times daily as needed for anxiety.    Marland Kitchen aspirin EC 81 MG tablet Take 81 mg by mouth daily.    Marland Kitchen diltiazem (CARDIZEM SR) 90 MG 12 hr  capsule Take 1 capsule (90 mg total) by mouth every 12 (twelve) hours. 60 capsule 0  . Ferrous Sulfate 134 MG TABS Take 1 tablet by mouth 2 (two) times daily.    . furosemide (LASIX) 40 MG tablet Take 40 mg by mouth daily.   1  . ipratropium-albuterol (DUONEB) 0.5-2.5 (3) MG/3ML SOLN Take 3 mLs by nebulization every 6 (six) hours as needed. 360 mL 0  . levothyroxine (SYNTHROID, LEVOTHROID) 50 MCG tablet Take 50 mcg by mouth daily before breakfast.    . lisinopril (PRINIVIL,ZESTRIL) 5 MG tablet Take 5 mg by mouth daily.   98  . Multiple Vitamins-Minerals (CENTRUM SILVER ULTRA WOMENS) TABS Take 1 tablet by mouth daily.    . potassium chloride SA (K-DUR,KLOR-CON) 20 MEQ tablet take 1 capsule by mouth daily  1  . pravastatin (PRAVACHOL) 20 MG tablet Take 20 mg by mouth at bedtime.    . predniSONE (DELTASONE) 5 MG tablet Take 5 mg by mouth daily with breakfast.    . PROAIR HFA 108 (90 BASE) MCG/ACT inhaler Inhale 2 puffs into the lungs every 4 (four) hours as needed for wheezing or shortness of breath.   0  . Probiotic Product (PHILLIPS COLON HEALTH) CAPS Take 1 capsule by mouth daily.    . ursodiol (ACTIGALL) 300 MG capsule Take 300 mg by mouth 4 (four) times daily.     . Vitamin D, Ergocalciferol, (DRISDOL) 50000 units CAPS capsule Take 50,000 Units by mouth every 14 (fourteen) days.     Marland Kitchen warfarin (COUMADIN) 2 MG tablet Take 2-4 mg by mouth See admin instructions. Take 1 tablet dally (only on Mondays) and take 2 tablets daily only Tuesday- Sunday  5  . anastrozole (ARIMIDEX) 1 MG tablet Take 1 tablet (1 mg total) by mouth daily. (Patient not taking: Reported on 09/08/2016) 30 tablet 6   No current facility-administered medications for this visit.     PHYSICAL EXAMINATION: ECOG PERFORMANCE STATUS: 1 - Symptomatic but completely ambulatory  BP 122/82 (Patient Position: Sitting)   Pulse 99   Temp 98.1 F (36.7 C) (Tympanic)   Resp 18   Ht _0  (1.575 m)   Wt 196 lb 6.9 oz (89.1 kg)   BMI  35.93 kg/m   Filed Weights   03/16/17 1409  Weight: 196 lb 6.9 oz (89.1 kg)    GENERAL: Well-nourished well-developed; Alert, no distress and comfortable.  She is alone. EYES: no pallor or icterus OROPHARYNX: no thrush or ulceration; good dentition  NECK: supple, no masses  felt LYMPH:  no palpable lymphadenopathy in the cervical, axillary or inguinal regions LUNGS: clear to auscultation and  No wheeze or crackles HEART/CVS: regular rate & rhythm and no murmurs; No lower extremity edema ABDOMEN:abdomen soft, non-tender and normal bowel sounds Musculoskeletal:no cyanosis of digits and no clubbing  PSYCH: alert & oriented x 3 with fluent speech NEURO: no focal motor/sensory deficits SKIN:  Multiple bruises noted bilateral extremities [chronic]  LABORATORY DATA:  I have reviewed the data as listed    Component Value Date/Time   NA 131 (L) 03/09/2017 0903   NA 129 (L) 12/30/2014 0921   K 4.0 03/09/2017 0903   K 4.7 12/30/2014 0921   CL 93 (L) 03/09/2017 0903   CL 91 (L) 12/30/2014 0921   CO2 25 03/09/2017 0903   CO2 26 12/30/2014 0921   GLUCOSE 109 (H) 03/09/2017 0903   GLUCOSE 103 (H) 12/30/2014 0921   BUN 18 03/09/2017 0903   BUN 22 (H) 12/30/2014 0921   CREATININE 1.56 (H) 03/09/2017 0903   CREATININE 1.22 (H) 12/30/2014 0921   CALCIUM 9.3 03/09/2017 0903   CALCIUM 9.1 12/30/2014 0921   PROT 7.4 03/09/2017 0903   PROT 7.9 12/30/2014 0921   ALBUMIN 3.4 (L) 03/09/2017 0903   ALBUMIN 4.0 12/30/2014 0921   AST 39 03/09/2017 0903   AST 31 12/30/2014 0921   ALT 20 03/09/2017 0903   ALT 29 12/30/2014 0921   ALKPHOS 103 03/09/2017 0903   ALKPHOS 101 12/30/2014 0921   BILITOT 0.9 03/09/2017 0903   BILITOT 0.5 12/30/2014 0921   GFRNONAA 32 (L) 03/09/2017 0903   GFRNONAA 45 (L) 12/30/2014 0921   GFRAA 37 (L) 03/09/2017 0903   GFRAA 52 (L) 12/30/2014 0921    No results found for: SPEP, UPEP  Lab Results  Component Value Date   WBC 9.9 03/09/2017   NEUTROABS 8.8 (H)  03/09/2017   HGB 13.8 03/09/2017   HCT 40.7 03/09/2017   MCV 90.9 03/09/2017   PLT 403 03/09/2017      Chemistry      Component Value Date/Time   NA 131 (L) 03/09/2017 0903   NA 129 (L) 12/30/2014 0921   K 4.0 03/09/2017 0903   K 4.7 12/30/2014 0921   CL 93 (L) 03/09/2017 0903   CL 91 (L) 12/30/2014 0921   CO2 25 03/09/2017 0903   CO2 26 12/30/2014 0921   BUN 18 03/09/2017 0903   BUN 22 (H) 12/30/2014 0921   CREATININE 1.56 (H) 03/09/2017 0903   CREATININE 1.22 (H) 12/30/2014 0921      Component Value Date/Time   CALCIUM 9.3 03/09/2017 0903   CALCIUM 9.1 12/30/2014 0921   ALKPHOS 103 03/09/2017 0903   ALKPHOS 101 12/30/2014 0921   AST 39 03/09/2017 0903   AST 31 12/30/2014 0921   ALT 20 03/09/2017 0903   ALT 29 12/30/2014 0921   BILITOT 0.9 03/09/2017 0903   BILITOT 0.5 12/30/2014 0921       RADIOGRAPHIC STUDIES: I have personally reviewed the radiological images as listed and agreed with the findings in the report. No results found.   ASSESSMENT & PLAN:  Carcinoma of overlapping sites of right breast in female, estrogen receptor positive ([place]) # RIGHT BREAST CA stage I  [2012] ER/PR positive HER-2/neu negative  status post lumpectomy post radiation on aromatase inhibitor [stopped 2017 dec; x 5 years]. Clinically no evidence of recurrence.mammo- April 2018- WNL.   # ANEMIA from iron deficiency/CKD- continue PO iron- Hb today- 13.   #  HISTORY OF THROMBOCYTOSIS-long-standing.likley reactive [off hydrea x last 6 month]. Monitor for now.  # Afib/RVR- s/p recent ppm- on coumadin/ card  # Follow-up in 12 months with labs iron studies; mammogram in April 2019.   CC; Dr.Thies.     Cammie Sickle, MD 03/16/2017 3:41 PM

## 2017-05-04 DIAGNOSIS — Z95810 Presence of automatic (implantable) cardiac defibrillator: Secondary | ICD-10-CM | POA: Insufficient documentation

## 2017-06-24 DIAGNOSIS — R251 Tremor, unspecified: Secondary | ICD-10-CM | POA: Insufficient documentation

## 2017-06-24 DIAGNOSIS — R2689 Other abnormalities of gait and mobility: Secondary | ICD-10-CM | POA: Insufficient documentation

## 2017-08-04 ENCOUNTER — Emergency Department: Payer: Medicare Other

## 2017-08-04 ENCOUNTER — Encounter: Payer: Self-pay | Admitting: Emergency Medicine

## 2017-08-04 ENCOUNTER — Emergency Department
Admission: EM | Admit: 2017-08-04 | Discharge: 2017-08-04 | Disposition: A | Discharge status: Home / Self Care | Payer: Medicare Other | Attending: Emergency Medicine | Admitting: Emergency Medicine

## 2017-08-04 DIAGNOSIS — E860 Dehydration: Secondary | ICD-10-CM | POA: Diagnosis not present

## 2017-08-04 DIAGNOSIS — I5032 Chronic diastolic (congestive) heart failure: Secondary | ICD-10-CM | POA: Insufficient documentation

## 2017-08-04 DIAGNOSIS — Z87891 Personal history of nicotine dependence: Secondary | ICD-10-CM | POA: Insufficient documentation

## 2017-08-04 DIAGNOSIS — W010XXA Fall on same level from slipping, tripping and stumbling without subsequent striking against object, initial encounter: Secondary | ICD-10-CM | POA: Diagnosis not present

## 2017-08-04 DIAGNOSIS — N189 Chronic kidney disease, unspecified: Secondary | ICD-10-CM | POA: Diagnosis not present

## 2017-08-04 DIAGNOSIS — Z79899 Other long term (current) drug therapy: Secondary | ICD-10-CM | POA: Diagnosis not present

## 2017-08-04 DIAGNOSIS — Z7982 Long term (current) use of aspirin: Secondary | ICD-10-CM | POA: Insufficient documentation

## 2017-08-04 DIAGNOSIS — Y999 Unspecified external cause status: Secondary | ICD-10-CM | POA: Insufficient documentation

## 2017-08-04 DIAGNOSIS — Y9301 Activity, walking, marching and hiking: Secondary | ICD-10-CM | POA: Diagnosis not present

## 2017-08-04 DIAGNOSIS — Z7901 Long term (current) use of anticoagulants: Secondary | ICD-10-CM | POA: Diagnosis not present

## 2017-08-04 DIAGNOSIS — W19XXXA Unspecified fall, initial encounter: Secondary | ICD-10-CM

## 2017-08-04 DIAGNOSIS — J449 Chronic obstructive pulmonary disease, unspecified: Secondary | ICD-10-CM | POA: Diagnosis not present

## 2017-08-04 DIAGNOSIS — Y92003 Bedroom of unspecified non-institutional (private) residence as the place of occurrence of the external cause: Secondary | ICD-10-CM | POA: Diagnosis not present

## 2017-08-04 DIAGNOSIS — S0990XA Unspecified injury of head, initial encounter: Secondary | ICD-10-CM | POA: Diagnosis present

## 2017-08-04 DIAGNOSIS — I130 Hypertensive heart and chronic kidney disease with heart failure and stage 1 through stage 4 chronic kidney disease, or unspecified chronic kidney disease: Secondary | ICD-10-CM | POA: Insufficient documentation

## 2017-08-04 LAB — CBC
HEMATOCRIT: 40.8 % (ref 35.0–47.0)
HEMOGLOBIN: 13.6 g/dL (ref 12.0–16.0)
MCH: 31.1 pg (ref 26.0–34.0)
MCHC: 33.3 g/dL (ref 32.0–36.0)
MCV: 93.2 fL (ref 80.0–100.0)
Platelets: 418 10*3/uL (ref 150–440)
RBC: 4.38 MIL/uL (ref 3.80–5.20)
RDW: 14.4 % (ref 11.5–14.5)
WBC: 11.8 10*3/uL — ABNORMAL HIGH (ref 3.6–11.0)

## 2017-08-04 LAB — URINALYSIS, ROUTINE W REFLEX MICROSCOPIC
Bilirubin Urine: NEGATIVE
GLUCOSE, UA: NEGATIVE mg/dL
Ketones, ur: NEGATIVE mg/dL
LEUKOCYTES UA: NEGATIVE
Nitrite: NEGATIVE
PROTEIN: NEGATIVE mg/dL
Specific Gravity, Urine: 1.003 — ABNORMAL LOW (ref 1.005–1.030)
pH: 5 (ref 5.0–8.0)

## 2017-08-04 LAB — COMPREHENSIVE METABOLIC PANEL
ALBUMIN: 3.7 g/dL (ref 3.5–5.0)
ALK PHOS: 198 U/L — AB (ref 38–126)
ALT: 32 U/L (ref 14–54)
AST: 90 U/L — AB (ref 15–41)
Anion gap: 17 — ABNORMAL HIGH (ref 5–15)
BILIRUBIN TOTAL: 1 mg/dL (ref 0.3–1.2)
BUN: 21 mg/dL — AB (ref 6–20)
CALCIUM: 9.6 mg/dL (ref 8.9–10.3)
CO2: 23 mmol/L (ref 22–32)
CREATININE: 1.59 mg/dL — AB (ref 0.44–1.00)
Chloride: 87 mmol/L — ABNORMAL LOW (ref 101–111)
GFR calc Af Amer: 36 mL/min — ABNORMAL LOW (ref >60)
GFR calc non Af Amer: 31 mL/min — ABNORMAL LOW (ref >60)
GLUCOSE: 93 mg/dL (ref 65–99)
POTASSIUM: 3.9 mmol/L (ref 3.5–5.1)
Sodium: 127 mmol/L — ABNORMAL LOW (ref 135–145)
TOTAL PROTEIN: 7.4 g/dL (ref 6.5–8.1)

## 2017-08-04 LAB — BASIC METABOLIC PANEL
ANION GAP: 13 (ref 5–15)
BUN: 19 mg/dL (ref 6–20)
CALCIUM: 8.3 mg/dL — AB (ref 8.9–10.3)
CO2: 24 mmol/L (ref 22–32)
Chloride: 95 mmol/L — ABNORMAL LOW (ref 101–111)
Creatinine, Ser: 1.33 mg/dL — ABNORMAL HIGH (ref 0.44–1.00)
GFR calc Af Amer: 45 mL/min — ABNORMAL LOW (ref >60)
GFR calc non Af Amer: 39 mL/min — ABNORMAL LOW (ref >60)
GLUCOSE: 71 mg/dL (ref 65–99)
POTASSIUM: 3.7 mmol/L (ref 3.5–5.1)
Sodium: 132 mmol/L — ABNORMAL LOW (ref 135–145)

## 2017-08-04 LAB — TROPONIN I
Troponin I: 0.04 ng/mL (ref <0.03)
Troponin I: 0.04 ng/mL (ref <0.03)

## 2017-08-04 LAB — PROTIME-INR
INR: 1.52
Prothrombin Time: 18.2 seconds — ABNORMAL HIGH (ref 11.4–15.2)

## 2017-08-04 LAB — ETHANOL: Alcohol, Ethyl (B): <10 mg/dL (ref <10)

## 2017-08-04 MED ORDER — SODIUM CHLORIDE 0.9 % IV BOLUS (SEPSIS)
1000.0000 mL | Freq: Once | INTRAVENOUS | Status: AC
  Administered 2017-08-04: 1000 mL via INTRAVENOUS

## 2017-08-04 NOTE — ED Provider Notes (Signed)
[facility] Emergency Department Provider Note  Time seen: 5:08 PM  I have reviewed the triage vital signs and the nursing notes.   HISTORY  Chief Complaint Fall    HPI Adriana Martin is a 71 y.o. female with a past medical history of atrial fibrillation on Coumadin, CHF, CKD, COPD, hypertension, hyperlipidemia, presents to the emergency department after a fall.  According to the patient she was attempting to get from her bedroom to her bathroom when she tripped and fell.  Patient complains of mild left hip pain.  Has not ambulated since the fall.  States the fall occurred this morning, the daughter came over around lunchtime and help the patient get up, but she was having pain so they called EMS.  Patient states she believes she did hit her head but denies LOC.  Does take Coumadin for atrial fibrillation.  Patient does admit to frequent alcohol use including last night but denies any today.  The daughter states over the past 2-3 weeks the patient has been more weak than normal.  Patient is supposed to use a walker to ambulate.   Past Medical History:  Diagnosis Date  . Anemia   . Anxiety   . Atrial fibrillation ([place])   . B12 deficiency   . Breast cancer ([place])    RT Lumpectomy c radiation 2012  . Breast cancer ([place])   . CHF (congestive heart failure) ([place])   . Chronic kidney disease   . Cirrhosis ([place])   . COPD (chronic obstructive pulmonary disease) ([place])   . Depression   . GERD (gastroesophageal reflux disease)   . Headache   . Heart murmur   . Hyperlipemia   . Hyperlipidemia   . Hypertension   . Iron (Fe) deficiency anemia   . Shortness of breath dyspnea   . Thrombocytopenia ([place])   . Vitamin D deficiency     Patient Active Problem List   Diagnosis Date Noted  . Carcinoma of overlapping sites of right breast in female, estrogen receptor positive ([place]) 06/16/2016  . Acute exacerbation of CHF (congestive heart failure) ([place]) 06/07/2016  . A-fib  ([place]) 04/08/2016  . COPD exacerbation ([place]) 03/22/2016  . Acute bronchitis 03/22/2016  . Atrial fibrillation with RVR ([place]) 03/22/2016  . Chronic renal insufficiency 03/22/2016  . Cough with expectoration 06/04/2015  . Thrombocytosis ([place]) 02/01/2015  . Anemia 02/01/2015  . Atrial flutter ([place]) 01/23/2015  . Atrial fibrillation ([place]) 01/23/2015  . Other long term (current) drug therapy 11/06/2014  . Chronic diastolic heart failure ([place]) 08/01/2014  . Long term current use of anticoagulant 06/06/2014  . Anxiety 05/13/2014  . Chronic obstructive pulmonary disease ([place]) 03/22/2014  . 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 Avitaminosis D 02/19/2014    Past Surgical History:  Procedure Laterality Date  . ABDOMINAL HYSTERECTOMY     Partial  . ABLATION    . APPENDECTOMY    . BREAST BIOPSY Right 2012  . BREAST LUMPECTOMY Right 2012  . BREAST SURGERY    . CARDIAC SURGERY    . COLONOSCOPY  2012  . ESOPHAGOGASTRODUODENOSCOPY  2012  . EYE SURGERY    . MITRAL VALVE REPLACEMENT    . mvp    . VSD REPAIR      Prior to Admission medications   Medication Sig Start Date End Date Taking? Authorizing Provider  ADVAIR DISKUS 100-50 MCG/DOSE AEPB Inhale 1 puff into the lungs 2 (two) times daily.  10/29/14   [provider]  alendronate (FOSAMAX) 70 MG tablet Take 70 mg by mouth once a week. Take with a full glass of water on an empty stomach. *Take on Sunday*    [provider]  ALPRAZolam Duanne Moron) 0.5 MG tablet Take 0.5 mg by mouth 2 (two) times daily as needed for anxiety.    [provider]  anastrozole (ARIMIDEX) 1 MG tablet Take 1 tablet (1 mg total) by mouth daily. Patient not taking: Reported on 09/08/2016 03/18/16   Cammie Sickle, MD  aspirin EC 81 MG tablet Take 81 mg by mouth daily.    [provider]  diltiazem (CARDIZEM SR) 90 MG 12 hr capsule Take 1 capsule (90 mg total) by mouth every 12 (twelve) hours. 06/10/16   Loletha Grayer,  MD  Ferrous Sulfate 134 MG TABS Take 1 tablet by mouth 2 (two) times daily.    [provider]  furosemide (LASIX) 40 MG tablet Take 40 mg by mouth daily.  04/03/16   [provider]  ipratropium-albuterol (DUONEB) 0.5-2.5 (3) MG/3ML SOLN Take 3 mLs by nebulization every 6 (six) hours as needed. 06/10/16   Loletha Grayer, MD  levothyroxine (SYNTHROID, LEVOTHROID) 50 MCG tablet Take 50 mcg by mouth daily before breakfast.    [provider]  lisinopril (PRINIVIL,ZESTRIL) 5 MG tablet Take 5 mg by mouth daily.  10/07/15   [provider]  Multiple Vitamins-Minerals (CENTRUM SILVER ULTRA WOMENS) TABS Take 1 tablet by mouth daily.    [provider]  potassium chloride SA (K-DUR,KLOR-CON) 20 MEQ tablet take 1 capsule by mouth daily 04/03/16   [provider]  pravastatin (PRAVACHOL) 20 MG tablet Take 20 mg by mouth at bedtime.    [provider]  predniSONE (DELTASONE) 5 MG tablet Take 5 mg by mouth daily with breakfast.    [provider]  PROAIR HFA 108 (90 BASE) MCG/ACT inhaler Inhale 2 puffs into the lungs every 4 (four) hours as needed for wheezing or shortness of breath.  10/29/14   [provider]  Probiotic Product ([place]) CAPS Take 1 capsule by mouth daily.    [provider]  ursodiol (ACTIGALL) 300 MG capsule Take 300 mg by mouth 4 (four) times daily.     [provider]  Vitamin D, Ergocalciferol, (DRISDOL) 50000 units CAPS capsule Take 50,000 Units by mouth every 14 (fourteen) days.  07/31/15   [provider]  warfarin (COUMADIN) 2 MG tablet Take 2-4 mg by mouth See admin instructions. Take 1 tablet dally (only on Mondays) and take 2 tablets daily only Tuesday- Sunday 04/01/15   [provider]    Allergies  Allergen Reactions  . Atorvastatin Shortness Of Breath    Other reaction(s): Unknown  . Calcium Carbonate     Other reaction(s): Unknown  . Fluoxetine      Other reaction(s): Unknown  . Hydrochlorothiazide     Other reaction(s): Unknown    Family History  Problem Relation Age of Onset  . Cancer Mother   . Breast cancer Mother   . Cancer Father   . Cancer Brother   . Cancer Daughter   . Breast cancer Daughter     Social History Social History   Tobacco Use  . Smoking status: Former Smoker    Packs/day: 0.25    Years: 30.00    Pack years: 7.50    Types: Cigarettes    Last attempt to quit: 10/07/1998    Years since quitting: 18.8  . Smokeless tobacco:  Never Used  Substance Use Topics  . Alcohol use: Yes    Alcohol/week: 2.4 oz    Types: 4 Cans of beer per week    Comment: socially  . Drug use: No    Review of Systems Constitutional: Negative for fever.  Positive for generalized weakness. Cardiovascular: Negative for chest pain. Respiratory: Negative for shortness of breath. Gastrointestinal: Negative for abdominal pain Musculoskeletal: As if her left hip pain.  Negative for neck or back pain. Neurological: Negative for headache All other ROS negative  ____________________________________________   PHYSICAL EXAM:  VITAL SIGNS: ED Triage Vitals  Enc Vitals Group     BP 08/04/17 1508 137/85     Pulse Rate 08/04/17 1508 88     Resp 08/04/17 1508 18     Temp 08/04/17 1508 98 F (36.7 C)     Temp Source 08/04/17 1508 Oral     SpO2 08/04/17 1508 98 %     Weight 08/04/17 1509 180 lb (81.6 kg)     Height 08/04/17 1509 5\' 3"  (1.6 m)     Head Circumference --      Peak Flow --      Pain Score 08/04/17 1508 6     Pain Loc --      Pain Edu? --      Excl. in [place]? --    Constitutional: Alert and oriented. Well appearing and in no distress. Eyes: Normal exam ENT   Head: Normocephalic and atraumatic.   Mouth/Throat: Mucous membranes are moist. Cardiovascular: Normal rate, regular rhythm. No murmur Respiratory: Normal respiratory effort without tachypnea nor retractions. Breath sounds are  clear Gastrointestinal: Soft and nontender. No distention.  Musculoskeletal: Mild tenderness with range of motion of left hip, nontender to palpation.  Small 2-3 cm skin tear to left elbow Neurologic:  Normal speech and language. No gross focal neurologic deficits  Skin:  Skin is warm, dry and intact.  Psychiatric: Mood and affect are normal.   ____________________________________________    RADIOLOGY  CT head and neck are negative  ____________________________________________   INITIAL IMPRESSION / ASSESSMENT AND PLAN / ED COURSE  Pertinent labs & imaging results that were available during my care of the patient were reviewed by me and considered in my medical decision making (see chart for details).  Patient presents to the emergency department after a fall.  Differential would include contusion, left hip fracture dislocation, intracranial hemorrhage or cervical spine fracture.  CT scan of the head and neck are negative.  Patient denies any headache or C-spine tenderness.  Patient does complain of mild left hip pain but good range of motion on examination, with some pain elicited.  Neurovascular intact distally.  Patient does have a skin tear to left elbow, mild and hemostatic.  Given the patient's complaint of generalized fatigue and weakness over the past 2-3 weeks we will also obtain lab work as well as urinalysis.  Daughter states the patient does use alcohol and near daily basis.  Reviewed the patient's records including recent discharge summary in 2017 for respiratory failure.  Patient is also following up with oncology for thrombocytosis and stage I breast cancer.  Patient's repeat labs show an unchanged troponin.  Metabolic panel is improved including anion gap and sodium.  I believe the patient is safe for discharge home.  Patient is wishing to be discharged home.  I discussed return precautions.  Patient agreeable to this plan.     ____________________________________________   FINAL CLINICAL IMPRESSION(S) / ED DIAGNOSES  Cheral Marker, MD 08/04/17 2122

## 2017-08-04 NOTE — ED Notes (Signed)
MD at bedside to update family and patient on results.

## 2017-08-04 NOTE — ED Triage Notes (Signed)
Pt comes into the ED via EMS from home c/o a fall from where she attempted to get out of bed and use the restroom.  Patient states she has back and left hip pain.  C-collar applied to patient by EMS.  Patient states she did hit her head but denies LOC.  States she had mild nausea and dizziness right after the fall occurred, but denies any currently.  Patient is on blood thinners.  Patient in NAD at this time with even and unlabored respirations and is neurologically intact.

## 2017-08-04 NOTE — ED Triage Notes (Signed)
Pt here via EMS after a mechanical fall, pt fell crawled to the living room, pt has a small laceration to head, pt is on Coumadin, pt consumes ETOH daily per EMS

## 2017-08-04 NOTE — ED Notes (Signed)
Pt assisted to toilet 

## 2017-08-04 NOTE — ED Notes (Signed)
Family had asked this RN if there was information on home health. RN explained in detail that family would have to follow up with primary care and insurance to determine the correct in home health care. Pt does not emet admission criteria and family does not want assisted living. Pt in NAD family reports they are concerned for the number of increased falls. Pt able to stand and ambulate to wheelchair and stand to get into car at discharge. Family reports pt has the proper assist devices at home. Wound on Left elbow dressed properly and family educated on skin care.

## 2017-08-04 NOTE — ED Notes (Signed)
C collar removed

## 2017-09-07 ENCOUNTER — Other Ambulatory Visit: Payer: Self-pay | Admitting: Orthopedic Surgery

## 2017-09-07 DIAGNOSIS — M25511 Pain in right shoulder: Secondary | ICD-10-CM

## 2017-09-07 DIAGNOSIS — M25311 Other instability, right shoulder: Secondary | ICD-10-CM

## 2017-09-07 DIAGNOSIS — M87021 Idiopathic aseptic necrosis of right humerus: Secondary | ICD-10-CM

## 2017-09-07 DIAGNOSIS — G8929 Other chronic pain: Secondary | ICD-10-CM

## 2017-09-22 ENCOUNTER — Ambulatory Visit
Admission: RE | Admit: 2017-09-22 | Discharge: 2017-09-22 | Disposition: A | Discharge status: Home / Self Care | Payer: Medicare Other | Source: Ambulatory Visit | Attending: Orthopedic Surgery | Admitting: Orthopedic Surgery

## 2017-09-22 DIAGNOSIS — M25511 Pain in right shoulder: Secondary | ICD-10-CM

## 2017-09-22 DIAGNOSIS — M75101 Unspecified rotator cuff tear or rupture of right shoulder, not specified as traumatic: Secondary | ICD-10-CM | POA: Insufficient documentation

## 2017-09-22 DIAGNOSIS — G8929 Other chronic pain: Secondary | ICD-10-CM

## 2017-09-22 DIAGNOSIS — M25311 Other instability, right shoulder: Secondary | ICD-10-CM

## 2017-09-22 DIAGNOSIS — M87021 Idiopathic aseptic necrosis of right humerus: Secondary | ICD-10-CM

## 2017-10-01 ENCOUNTER — Other Ambulatory Visit
Admission: RE | Admit: 2017-10-01 | Discharge: 2017-10-01 | Disposition: A | Discharge status: Home / Self Care | Payer: Medicare Other | Source: Ambulatory Visit | Attending: Internal Medicine | Admitting: Internal Medicine

## 2017-10-01 DIAGNOSIS — J449 Chronic obstructive pulmonary disease, unspecified: Secondary | ICD-10-CM | POA: Insufficient documentation

## 2017-10-01 LAB — PROTIME-INR
INR: 1.01
PROTHROMBIN TIME: 13.2 s (ref 11.4–15.2)

## 2017-10-13 ENCOUNTER — Other Ambulatory Visit: Payer: Self-pay

## 2017-10-13 ENCOUNTER — Encounter
Admission: RE | Admit: 2017-10-13 | Discharge: 2017-10-13 | Disposition: A | Discharge status: Home / Self Care | Payer: Medicare Other | Source: Ambulatory Visit | Attending: Orthopedic Surgery | Admitting: Orthopedic Surgery

## 2017-10-13 DIAGNOSIS — Z01810 Encounter for preprocedural cardiovascular examination: Secondary | ICD-10-CM | POA: Diagnosis present

## 2017-10-13 DIAGNOSIS — Z01812 Encounter for preprocedural laboratory examination: Secondary | ICD-10-CM | POA: Insufficient documentation

## 2017-10-13 HISTORY — DX: Hypothyroidism, unspecified: E03.9

## 2017-10-13 HISTORY — DX: Presence of cardiac pacemaker: Z95.0

## 2017-10-13 LAB — BASIC METABOLIC PANEL
ANION GAP: 14 (ref 5–15)
BUN: 44 mg/dL — ABNORMAL HIGH (ref 6–20)
CALCIUM: 9.8 mg/dL (ref 8.9–10.3)
CHLORIDE: 99 mmol/L — AB (ref 101–111)
CO2: 29 mmol/L (ref 22–32)
Creatinine, Ser: 1.99 mg/dL — ABNORMAL HIGH (ref 0.44–1.00)
GFR calc non Af Amer: 24 mL/min — ABNORMAL LOW (ref >60)
GFR, EST AFRICAN AMERICAN: 28 mL/min — AB (ref >60)
Glucose, Bld: 100 mg/dL — ABNORMAL HIGH (ref 65–99)
Potassium: 4.4 mmol/L (ref 3.5–5.1)
Sodium: 142 mmol/L (ref 135–145)

## 2017-10-13 LAB — SURGICAL PCR SCREEN
MRSA, PCR: NEGATIVE
STAPHYLOCOCCUS AUREUS: POSITIVE — AB

## 2017-10-13 LAB — CBC
HCT: 43 % (ref 35.0–47.0)
HEMOGLOBIN: 14.2 g/dL (ref 12.0–16.0)
MCH: 30 pg (ref 26.0–34.0)
MCHC: 33 g/dL (ref 32.0–36.0)
MCV: 91.1 fL (ref 80.0–100.0)
Platelets: 476 10*3/uL — ABNORMAL HIGH (ref 150–440)
RBC: 4.72 MIL/uL (ref 3.80–5.20)
RDW: 14.5 % (ref 11.5–14.5)
WBC: 13 10*3/uL — AB (ref 3.6–11.0)

## 2017-10-13 LAB — PROTIME-INR
INR: 1.91
PROTHROMBIN TIME: 21.7 s — AB (ref 11.4–15.2)

## 2017-10-13 NOTE — Patient Instructions (Signed)
Your procedure is scheduled on: Friday 10/29/17 Report to [place]. 2ND FLOOR MEDICAL MALL ENTRANCE. To find out your arrival time please call [phone] between 1PM - 3PM on Thursday 10/28/17.  Remember: Instructions that are not followed completely may result in serious medical risk, up to and including death, or upon the discretion of your surgeon and anesthesiologist your surgery may need to be rescheduled.    __X__ 1. Do not eat anything after midnight the night before your    procedure.  No gum chewing or hard candies.  You may drink clear   liquids up to 2 hours before you are scheduled to arrive at the   hospital for your procedure. Do not drink clear liquids within 2   hours of scheduled arrival to the hospital as this may lead to your   procedure being delayed or rescheduled.       Clear liquids include:   Water or Apple juice without pulp   Clear carbohydrate beverage such as Clearfast or Gatorade   Black coffee or Clear Tea (no milk, no creamer, do not add anything   to the coffee or tea)    Diabetics should only drink water   __X__ 2. No Alcohol for 24 hours before or after surgery.   ____ 3. Bring all medications with you on the day of surgery if instructed.    __X__ 4. Notify your doctor if there is any change in your medical condition     (cold, fever, infections).             __X___5. No smoking within 24 hours of your surgery.     Do not wear jewelry, make-up, hairpins, clips or nail polish.  Do not wear lotions, powders, or perfumes.   Do not shave 48 hours prior to surgery. Men may shave face and neck.  Do not bring valuables to the hospital.    [facility] is not responsible for any belongings or valuables.               Contacts, dentures or bridgework may not be worn into surgery.  Leave your suitcase in the car. After surgery it may be brought to your room.  For patients admitted to the hospital, discharge time is determined by your                treatment  team.   Patients discharged the day of surgery will not be allowed to drive home.   Please read over the following fact sheets that you were given:   MRSA Information   __X__ Take these medicines the morning of surgery with A SIP OF WATER:    1. DILTIAZEM  2. LEVOTHYROXINE  3. PREDNISONE  4. SERTRALINE  5.  6.  ____ Fleet Enema (as directed)   __X__ Use CHG Soap/SAGE wipes as directed  __X__ Use inhalers on the day of surgery  ____ Stop metformin 2 days prior to surgery    ____ Take 1/2 of usual insulin dose the night before surgery and none on the morning of surgery.   __X__ Stop Coumadin/Plavix/aspirin on need to contact dr Nehemiah Massed regarding ability to hold coumadin and aspirin prior to surgery  __X__ Stop Anti-inflammatories such as Advil, Aleve, Ibuprofen, Motrin, Naproxen, Naprosyn, Goodies,powder, or aspirin products.  OK to take Tylenol.   __X__ Stop supplements, Vitamin E, Fish Oil until after surgery.  (stop Melatonin 7 days prior to surgery)  ____ Bring C-Pap to the hospital.

## 2017-10-14 ENCOUNTER — Encounter: Payer: Self-pay | Admitting: *Deleted

## 2017-10-14 NOTE — Pre-Procedure Instructions (Signed)
Hope at Dr. Serita Grit office aware of need for clearances.

## 2017-10-14 NOTE — Pre-Procedure Instructions (Signed)
Preop EKG sent to Dr. Nehemiah Massed with request for clearance Abnormal labs and request for clearance sent to Dr. Kathalene Frames Both offices notified per phone

## 2017-10-29 ENCOUNTER — Inpatient Hospital Stay: Admission: RE | Admit: 2017-10-29 | Payer: Medicare Other | Source: Ambulatory Visit | Admitting: Orthopedic Surgery

## 2017-10-29 ENCOUNTER — Encounter: Admission: RE | Payer: Self-pay | Source: Ambulatory Visit

## 2017-10-29 SURGERY — ARTHROPLASTY, SHOULDER, TOTAL, REVERSE
Anesthesia: Choice | Laterality: Right

## 2017-11-15 IMAGING — DX DG CHEST 1V PORT
1 series · 1 of 1 positions shown · non-contrast
Comparison: 06/07/2016

CLINICAL DATA: Acute respiratory failure

EXAM:
PORTABLE CHEST 1 VIEW

[chest ap]
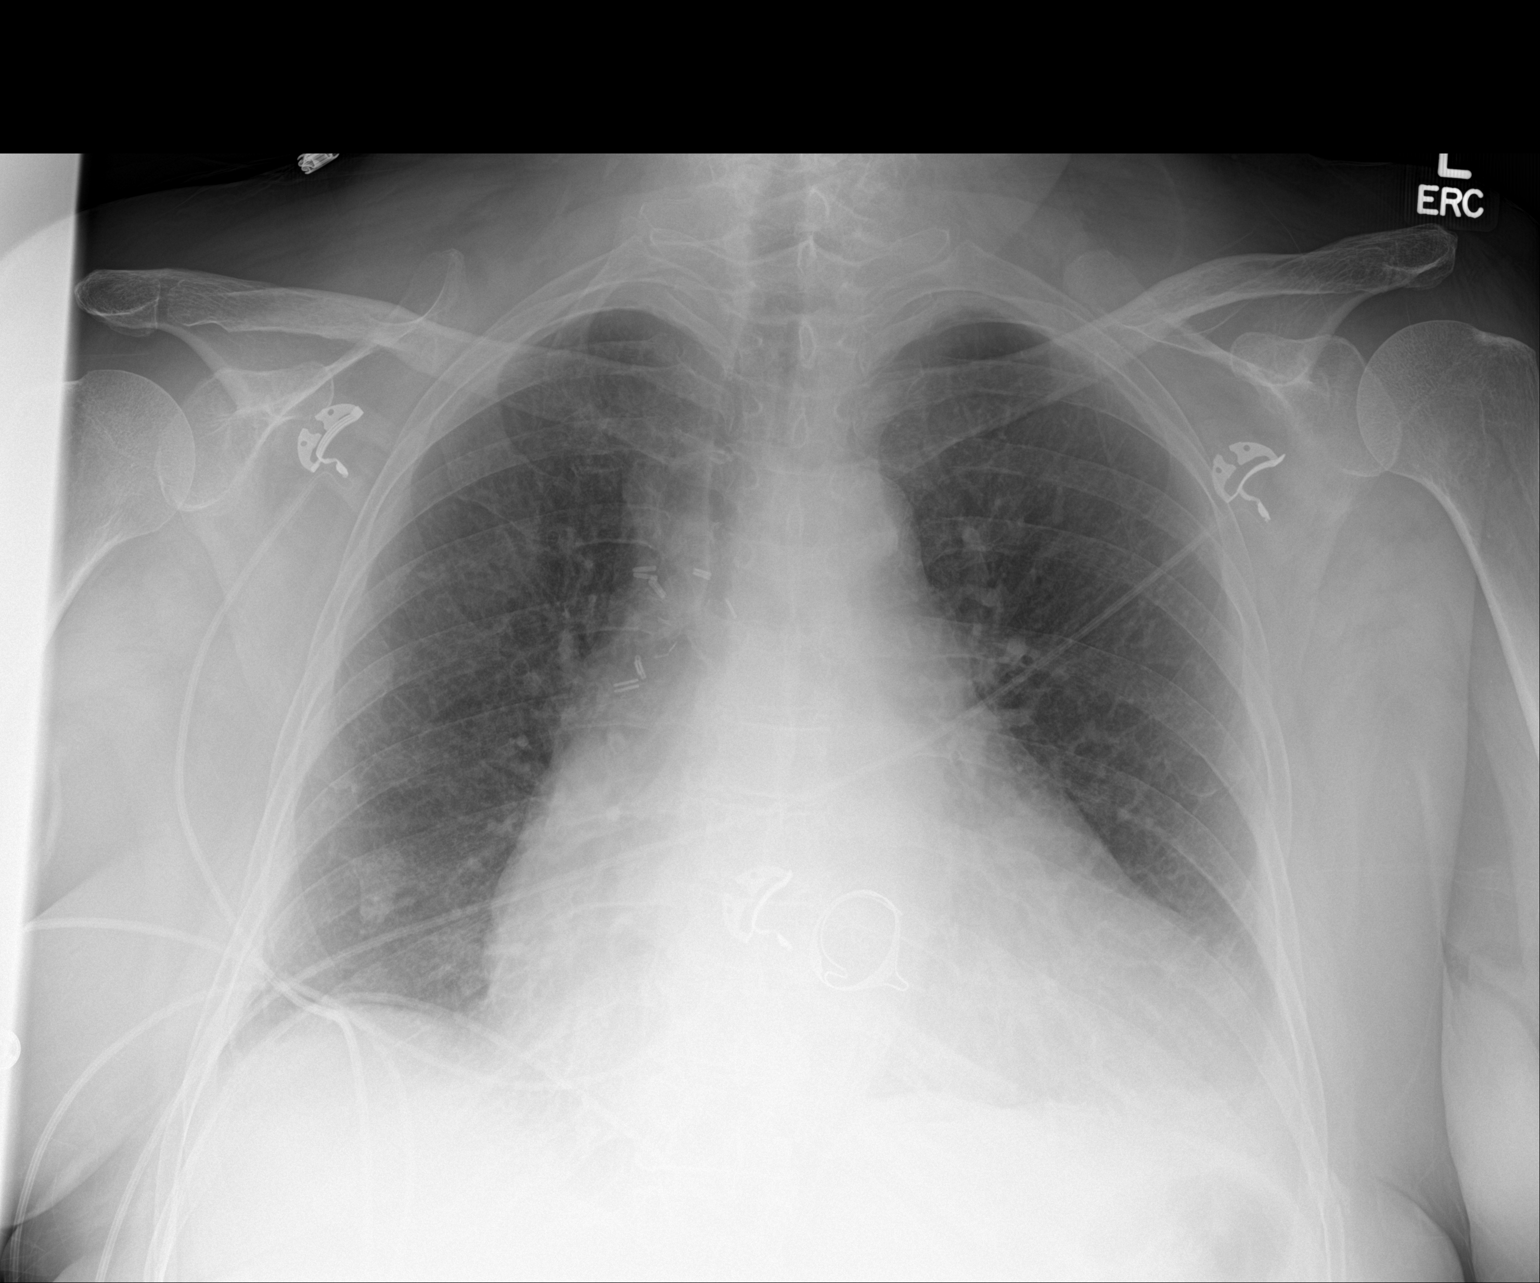

[1 of 1 positions shown; findings below may reference images not displayed]

FINDINGS: Prior valve replacement. Cardiomegaly. No confluent airspace
opacities or effusions no acute bony abnormality. Healing left
lateral eighth rib fracture noted.
IMPRESSION: Cardiomegaly.  No acute findings.

## 2017-12-07 ENCOUNTER — Encounter: Payer: Self-pay | Admitting: Nephrology

## 2017-12-14 ENCOUNTER — Encounter: Payer: Self-pay | Admitting: Internal Medicine

## 2017-12-14 ENCOUNTER — Ambulatory Visit
Admission: RE | Admit: 2017-12-14 | Discharge: 2017-12-14 | Disposition: A | Discharge status: Home / Self Care | Payer: Medicare Other | Source: Ambulatory Visit | Attending: Internal Medicine | Admitting: Internal Medicine

## 2017-12-14 ENCOUNTER — Inpatient Hospital Stay: Payer: Medicare Other

## 2017-12-14 ENCOUNTER — Inpatient Hospital Stay: Payer: Medicare Other | Attending: Hematology and Oncology | Admitting: Internal Medicine

## 2017-12-14 VITALS — BP 125/84 | HR 97 | Temp 98.1°F | Resp 16 | Wt 165.7 lb

## 2017-12-14 DIAGNOSIS — D472 Monoclonal gammopathy: Secondary | ICD-10-CM | POA: Diagnosis not present

## 2017-12-14 DIAGNOSIS — C9000 Multiple myeloma not having achieved remission: Secondary | ICD-10-CM | POA: Diagnosis present

## 2017-12-14 DIAGNOSIS — Z79899 Other long term (current) drug therapy: Secondary | ICD-10-CM | POA: Diagnosis not present

## 2017-12-14 DIAGNOSIS — J449 Chronic obstructive pulmonary disease, unspecified: Secondary | ICD-10-CM | POA: Insufficient documentation

## 2017-12-14 DIAGNOSIS — N183 Chronic kidney disease, stage 3 (moderate): Secondary | ICD-10-CM | POA: Diagnosis not present

## 2017-12-14 DIAGNOSIS — I517 Cardiomegaly: Secondary | ICD-10-CM | POA: Insufficient documentation

## 2017-12-14 DIAGNOSIS — Z7982 Long term (current) use of aspirin: Secondary | ICD-10-CM | POA: Diagnosis not present

## 2017-12-14 DIAGNOSIS — D509 Iron deficiency anemia, unspecified: Secondary | ICD-10-CM | POA: Insufficient documentation

## 2017-12-14 DIAGNOSIS — E559 Vitamin D deficiency, unspecified: Secondary | ICD-10-CM | POA: Diagnosis not present

## 2017-12-14 DIAGNOSIS — E785 Hyperlipidemia, unspecified: Secondary | ICD-10-CM | POA: Insufficient documentation

## 2017-12-14 DIAGNOSIS — Z7901 Long term (current) use of anticoagulants: Secondary | ICD-10-CM | POA: Insufficient documentation

## 2017-12-14 DIAGNOSIS — Z170 Estrogen receptor positive status [ER+]: Secondary | ICD-10-CM | POA: Insufficient documentation

## 2017-12-14 DIAGNOSIS — F419 Anxiety disorder, unspecified: Secondary | ICD-10-CM | POA: Insufficient documentation

## 2017-12-14 DIAGNOSIS — Z950 Presence of cardiac pacemaker: Secondary | ICD-10-CM | POA: Diagnosis not present

## 2017-12-14 DIAGNOSIS — Z952 Presence of prosthetic heart valve: Secondary | ICD-10-CM | POA: Diagnosis not present

## 2017-12-14 DIAGNOSIS — M899 Disorder of bone, unspecified: Secondary | ICD-10-CM | POA: Diagnosis not present

## 2017-12-14 DIAGNOSIS — I130 Hypertensive heart and chronic kidney disease with heart failure and stage 1 through stage 4 chronic kidney disease, or unspecified chronic kidney disease: Secondary | ICD-10-CM | POA: Diagnosis not present

## 2017-12-14 DIAGNOSIS — F329 Major depressive disorder, single episode, unspecified: Secondary | ICD-10-CM | POA: Insufficient documentation

## 2017-12-14 DIAGNOSIS — Z87891 Personal history of nicotine dependence: Secondary | ICD-10-CM | POA: Insufficient documentation

## 2017-12-14 DIAGNOSIS — Z853 Personal history of malignant neoplasm of breast: Secondary | ICD-10-CM | POA: Diagnosis not present

## 2017-12-14 DIAGNOSIS — I4891 Unspecified atrial fibrillation: Secondary | ICD-10-CM | POA: Diagnosis not present

## 2017-12-14 DIAGNOSIS — M4854XA Collapsed vertebra, not elsewhere classified, thoracic region, initial encounter for fracture: Secondary | ICD-10-CM | POA: Diagnosis not present

## 2017-12-14 DIAGNOSIS — K219 Gastro-esophageal reflux disease without esophagitis: Secondary | ICD-10-CM | POA: Diagnosis not present

## 2017-12-14 DIAGNOSIS — E538 Deficiency of other specified B group vitamins: Secondary | ICD-10-CM | POA: Diagnosis not present

## 2017-12-14 DIAGNOSIS — D473 Essential (hemorrhagic) thrombocythemia: Secondary | ICD-10-CM

## 2017-12-14 DIAGNOSIS — I509 Heart failure, unspecified: Secondary | ICD-10-CM | POA: Diagnosis not present

## 2017-12-14 NOTE — Progress Notes (Signed)
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 [name] from orthopedics-for upcoming right shoulder surgery.  As part of the preop workup patient had-blood work that showed elevated creatinine 1.99; which led to further referral to Dr. Latif/nephrology.  As part of his workup-patient was noted to have abnormal M protein; she is here for further evaluation.  Patient denies any bone pain; except for chronic arthritic pain right shoulder.  She denies any gum bleeding or nosebleeds.  She denies any history of strokes or any prior history of DVT/ PEs.   REVIEW OF SYSTEMS:  A complete 10 point review of system is done which is negative except mentioned above/history of present illness.   PAST MEDICAL HISTORY :  Past Medical History:  Diagnosis Date  . Anemia   . Anxiety   . Atrial fibrillation ([place])   . B12 deficiency   . Breast cancer ([place])    RT Lumpectomy c radiation 2012  . Breast cancer ([place])   . CHF (congestive heart failure) ([place])   . Chronic kidney disease   . Cirrhosis  ([place])   . COPD (chronic obstructive pulmonary disease) ([place])   . Depression   . GERD (gastroesophageal reflux disease)   . Headache   . Heart murmur   . Hyperlipemia   . Hyperlipidemia   . Hypertension   . Hypothyroidism   . Iron (Fe) deficiency anemia   . Presence of permanent cardiac pacemaker   . Shortness of breath dyspnea   . Thrombocytopenia ([place])   . Vitamin D deficiency     PAST SURGICAL HISTORY :   Past Surgical History:  Procedure Laterality Date  . ABDOMINAL HYSTERECTOMY     Partial  . ABLATION    . APPENDECTOMY    . BREAST BIOPSY Right 2012  . BREAST LUMPECTOMY Right 2012  . BREAST SURGERY    . CARDIAC SURGERY    . CARDIAC VALVE REPLACEMENT    . COLONOSCOPY  2012  . ELECTROPHYSIOLOGIC STUDY N/A 01/23/2015   Procedure: CARDIOVERSION;  Surgeon: Corey Skains, MD;  Location: [facility] ORS;  Service: Cardiovascular;  Laterality: N/A;  . ELECTROPHYSIOLOGIC STUDY N/A 05/27/2016   Procedure: CARDIOVERSION;  Surgeon: Corey Skains, MD;  Location: [facility] ORS;  Service: Cardiovascular;  Laterality: N/A;  . ESOPHAGOGASTRODUODENOSCOPY  2012  . EYE SURGERY    . MITRAL VALVE REPLACEMENT    . mvp    . VSD REPAIR      FAMILY HISTORY :   Family History  Problem Relation Age of Onset  .  Cancer Mother   . Breast cancer Mother   . Cancer Father   . Cancer Brother   . Cancer Daughter   . Breast cancer Daughter     SOCIAL HISTORY:   Social History   Tobacco Use  . Smoking status: Former Smoker    Packs/day: 0.25    Years: 30.00    Pack years: 7.50    Types: Cigarettes    Last attempt to quit: 10/07/1998    Years since quitting: 19.2  . Smokeless tobacco: Never Used  Substance Use Topics  . Alcohol use: No    Alcohol/week: 2.4 oz    Types: 4 Cans of beer per week    Frequency: Never    Comment: socially  . Drug use: No    ALLERGIES:  is allergic to atorvastatin; calcium carbonate; fluoxetine; and hydrochlorothiazide.  MEDICATIONS:  Current Outpatient  Medications  Medication Sig Dispense Refill  . ADVAIR DISKUS 100-50 MCG/DOSE AEPB Inhale 1 puff into the lungs 2 (two) times daily.   0  . aspirin EC 81 MG tablet Take 81 mg by mouth daily.    Marland Kitchen diltiazem (CARDIZEM) 90 MG tablet Take 45 mg by mouth every 6 (six) hours.    . furosemide (LASIX) 40 MG tablet Take 40 mg by mouth daily.   1  . IRON PO Take 65 mg by mouth daily.    Marland Kitchen levothyroxine (SYNTHROID, LEVOTHROID) 50 MCG tablet Take 50 mcg by mouth daily before breakfast.    . lisinopril (PRINIVIL,ZESTRIL) 5 MG tablet Take 5 mg by mouth daily.   98  . Multiple Vitamins-Minerals (CENTRUM SILVER ULTRA WOMENS) TABS Take 1 tablet by mouth daily.    . potassium chloride (K-DUR,KLOR-CON) 10 MEQ tablet Take 10 mEq by mouth 2 (two) times daily.    . pravastatin (PRAVACHOL) 20 MG tablet Take 20 mg by mouth at bedtime.    . predniSONE (DELTASONE) 5 MG tablet Take 5 mg by mouth daily with breakfast.    . PROAIR HFA 108 (90 BASE) MCG/ACT inhaler Inhale 2 puffs into the lungs every 4 (four) hours as needed for wheezing or shortness of breath.   0  . Probiotic Product (PROBIOTIC DAILY PO) Take 1 capsule by mouth daily.    . sertraline (ZOLOFT) 100 MG tablet Take 100 mg by mouth daily.    . ursodiol (ACTIGALL) 300 MG capsule Take 300 mg by mouth 2 (two) times daily.     . Vitamin D, Ergocalciferol, (DRISDOL) 50000 units CAPS capsule Take 50,000 Units by mouth every 14 (fourteen) days.     Marland Kitchen warfarin (COUMADIN) 6 MG tablet Take 6 mg by mouth every evening.     Marland Kitchen anastrozole (ARIMIDEX) 1 MG tablet Take 1 tablet (1 mg total) by mouth daily. (Patient not taking: Reported on 10/13/2017) 30 tablet 6  . Melatonin 3 MG TABS Take 3 mg by mouth at bedtime as needed (sleep).     No current facility-administered medications for this visit.     PHYSICAL EXAMINATION: ECOG PERFORMANCE STATUS: 1 - Symptomatic but completely ambulatory  BP 125/84 (BP Location: Left Arm, Patient Position: Sitting)   Pulse 97   Temp  98.1 F (36.7 C) (Tympanic)   Resp 16   Wt 165 lb 10.8 oz (75.1 kg)   BMI 30.30 kg/m   Filed Weights   12/14/17 1522  Weight: 165 lb 10.8 oz (75.1 kg)    GENERAL: Well-nourished well-developed; Alert, no distress and comfortable.  She is accompanied by daughter.  EYES: no pallor or icterus OROPHARYNX: no thrush or ulceration; good dentition  NECK: supple, no masses felt LYMPH:  no palpable lymphadenopathy in the cervical, axillary or inguinal regions LUNGS: clear to auscultation and  No wheeze or crackles HEART/CVS: regular rate & rhythm and no murmurs; No lower extremity edema ABDOMEN:abdomen soft, non-tender and normal bowel sounds Musculoskeletal:no cyanosis of digits and no clubbing  PSYCH: alert & oriented x 3 with fluent speech NEURO: no focal motor/sensory deficits SKIN:  Multiple bruises noted bilateral extremities [chronic]  LABORATORY DATA:  I have reviewed the data as listed    Component Value Date/Time   NA 142 10/13/2017 1528   NA 129 (L) 12/30/2014 0921   K 4.4 10/13/2017 1528   K 4.7 12/30/2014 0921   CL 99 (L) 10/13/2017 1528   CL 91 (L) 12/30/2014 0921   CO2 29 10/13/2017 1528   CO2 26 12/30/2014 0921   GLUCOSE 100 (H) 10/13/2017 1528   GLUCOSE 103 (H) 12/30/2014 0921   BUN 44 (H) 10/13/2017 1528   BUN 22 (H) 12/30/2014 0921   CREATININE 1.99 (H) 10/13/2017 1528   CREATININE 1.22 (H) 12/30/2014 0921   CALCIUM 9.8 10/13/2017 1528   CALCIUM 9.1 12/30/2014 0921   PROT 7.4 08/04/2017 1716   PROT 7.9 12/30/2014 0921   ALBUMIN 3.7 08/04/2017 1716   ALBUMIN 4.0 12/30/2014 0921   AST 90 (H) 08/04/2017 1716   AST 31 12/30/2014 0921   ALT 32 08/04/2017 1716   ALT 29 12/30/2014 0921   ALKPHOS 198 (H) 08/04/2017 1716   ALKPHOS 101 12/30/2014 0921   BILITOT 1.0 08/04/2017 1716   BILITOT 0.5 12/30/2014 0921   GFRNONAA 24 (L) 10/13/2017 1528   GFRNONAA 45 (L) 12/30/2014 0921   GFRAA 28 (L) 10/13/2017 1528   GFRAA 52 (L) 12/30/2014 0921    No results  found for: SPEP, UPEP  Lab Results  Component Value Date   WBC 13.0 (H) 10/13/2017   NEUTROABS 8.8 (H) 03/09/2017   HGB 14.2 10/13/2017   HCT 43.0 10/13/2017   MCV 91.1 10/13/2017   PLT 476 (H) 10/13/2017      Chemistry      Component Value Date/Time   NA 142 10/13/2017 1528   NA 129 (L) 12/30/2014 0921   K 4.4 10/13/2017 1528   K 4.7 12/30/2014 0921   CL 99 (L) 10/13/2017 1528   CL 91 (L) 12/30/2014 0921   CO2 29 10/13/2017 1528   CO2 26 12/30/2014 0921   BUN 44 (H) 10/13/2017 1528   BUN 22 (H) 12/30/2014 0921   CREATININE 1.99 (H) 10/13/2017 1528   CREATININE 1.22 (H) 12/30/2014 0921      Component Value Date/Time   CALCIUM 9.8 10/13/2017 1528   CALCIUM 9.1 12/30/2014 0921   ALKPHOS 198 (H) 08/04/2017 1716   ALKPHOS 101 12/30/2014 0921   AST 90 (H) 08/04/2017 1716   AST 31 12/30/2014 0921   ALT 32 08/04/2017 1716   ALT 29 12/30/2014 0921   BILITOT 1.0 08/04/2017 1716   BILITOT 0.5 12/30/2014 0921       RADIOGRAPHIC STUDIES: I have personally reviewed the radiological images as listed and agreed with the findings in the report. No results found.   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  Recommend multiple myeloma workup including immunofixation; kappa lambda light chain; bone survey.  #  Mild long-standing thrombocytosis; previously on Hydrea; recommend checking Jak 2 mutation.   # RIGHT BREAST CA stage I  [2012] ER/PR positive HER-2/neu negative- Clinically no evidence of recurrence. Due mammogram April 2019.   # ANEMIA from iron deficiency/CKD- continue PO iron; most recent hemoglobin January 2019- 13 gm.   # Afib/RVR- s/p recent ppm- on coumadin/ card  #Follow-up to be decided based upon labs M protein workup.  With regards to upcoming shoulder surgery-I think she should be  cleared from hematology standpoint for surgery-unless any of the labs ordered today as significantly abnormal [which I doubt].  This was discussed with the patient/daughter.  Patient reluctant/with follow-ups; will recommend follow-up after above workup is available. Will call family with results.   CC; Dr.Thies/ Dr. Darrol Poke.     Cammie Sickle, MD 12/14/2017 4:04 PM

## 2017-12-14 NOTE — Assessment & Plan Note (Addendum)
#  likely MGUS-M protein 0.2; [incidental in the context of elevated kidney function]; no evidence of hypercalcemia/or significantly worsening anemia.  Recommend multiple myeloma workup including immunofixation; kappa lambda light chain; bone survey.  # Mild long-standing thrombocytosis; previously on Hydrea; recommend checking Jak 2 mutation.   # RIGHT BREAST CA stage I  [2012] ER/PR positive HER-2/neu negative- Clinically no evidence of recurrence. Due mammogram April 2019.   # ANEMIA from iron deficiency/CKD- continue PO iron; most recent hemoglobin January 2019- 13 gm.   # Afib/RVR- s/p recent ppm- on coumadin/ card  #Follow-up to be decided based upon labs M protein workup.  With regards to upcoming shoulder surgery-I think she should be cleared from hematology standpoint for surgery-unless any of the labs ordered today as significantly abnormal [which I doubt].  This was discussed with the patient/daughter.  Patient reluctant/with follow-ups; will recommend follow-up after above workup is available. Will call family with results.   Addendum: Kappa lambda light chain ratio 1.67; however skeletal survey-nonspecific lytic lesions seen on the skull-question etiology. This should not hold her shoulder surgery for now; recommend proceeding with shoulder surgery as planned. Also discussed with Dr.Lateef.   CC; Dr.Thies/ Dr. Darrol Poke.

## 2017-12-15 ENCOUNTER — Encounter: Payer: Medicare Other | Admitting: Hematology and Oncology

## 2017-12-15 ENCOUNTER — Telehealth: Payer: Self-pay | Admitting: Internal Medicine

## 2017-12-15 LAB — KAPPA/LAMBDA LIGHT CHAINS
KAPPA, LAMDA LIGHT CHAIN RATIO: 1.67 — AB (ref 0.26–1.65)
Kappa free light chain: 48.3 mg/L — ABNORMAL HIGH (ref 3.3–19.4)
Lambda free light chains: 29 mg/L — ABNORMAL HIGH (ref 5.7–26.3)

## 2017-12-15 NOTE — Telephone Encounter (Signed)
Spoke to patient's daughter [place] Meth survey shows nonspecific lytic lesions in the skull; slight elevation of M protein-I do not suspect patient has active myeloma at this time.    The above findings should not hold her shoulder surgery;    Please have the patient follow-up with me in 6 months/myeloma lab/kappa lambda light chain CBC CMP/skeletal survey-1 week prior.

## 2017-12-16 ENCOUNTER — Encounter: Payer: Self-pay | Admitting: Nephrology

## 2017-12-16 ENCOUNTER — Other Ambulatory Visit: Payer: Self-pay | Admitting: Internal Medicine

## 2017-12-16 DIAGNOSIS — D472 Monoclonal gammopathy: Secondary | ICD-10-CM

## 2017-12-16 LAB — MULTIPLE MYELOMA PANEL, SERUM
ALPHA2 GLOB SERPL ELPH-MCNC: 0.9 g/dL (ref 0.4–1.0)
Albumin SerPl Elph-Mcnc: 3.9 g/dL (ref 2.9–4.4)
Albumin/Glob SerPl: 1.1 (ref 0.7–1.7)
Alpha 1: 0.3 g/dL (ref 0.0–0.4)
B-Globulin SerPl Elph-Mcnc: 1.4 g/dL — ABNORMAL HIGH (ref 0.7–1.3)
Gamma Glob SerPl Elph-Mcnc: 1 g/dL (ref 0.4–1.8)
Globulin, Total: 3.6 g/dL (ref 2.2–3.9)
IGG (IMMUNOGLOBIN G), SERUM: 913 mg/dL (ref 700–1600)
IGM (IMMUNOGLOBULIN M), SRM: 96 mg/dL (ref 26–217)
IgA: 341 mg/dL (ref 64–422)
M Protein SerPl Elph-Mcnc: 0.3 g/dL — ABNORMAL HIGH
TOTAL PROTEIN ELP: 7.5 g/dL (ref 6.0–8.5)

## 2017-12-16 NOTE — Telephone Encounter (Signed)
Orders have been placed.

## 2017-12-17 LAB — JAK2 GENOTYPR

## 2017-12-28 ENCOUNTER — Encounter
Admission: RE | Admit: 2017-12-28 | Discharge: 2017-12-28 | Disposition: A | Discharge status: Home / Self Care | Payer: Medicare Other | Source: Ambulatory Visit | Attending: Orthopedic Surgery | Admitting: Orthopedic Surgery

## 2017-12-28 ENCOUNTER — Other Ambulatory Visit: Payer: Self-pay

## 2017-12-28 DIAGNOSIS — Z01818 Encounter for other preprocedural examination: Secondary | ICD-10-CM | POA: Diagnosis not present

## 2017-12-28 DIAGNOSIS — M25311 Other instability, right shoulder: Secondary | ICD-10-CM | POA: Insufficient documentation

## 2017-12-28 LAB — URINALYSIS, ROUTINE W REFLEX MICROSCOPIC
Bilirubin Urine: NEGATIVE
Glucose, UA: NEGATIVE mg/dL
HGB URINE DIPSTICK: NEGATIVE
Ketones, ur: NEGATIVE mg/dL
Nitrite: NEGATIVE
PROTEIN: NEGATIVE mg/dL
Specific Gravity, Urine: 1.01 (ref 1.005–1.030)
pH: 5 (ref 5.0–8.0)

## 2017-12-28 LAB — CBC
HCT: 38 % (ref 35.0–47.0)
HEMOGLOBIN: 12.7 g/dL (ref 12.0–16.0)
MCH: 29.2 pg (ref 26.0–34.0)
MCHC: 33.3 g/dL (ref 32.0–36.0)
MCV: 87.6 fL (ref 80.0–100.0)
Platelets: 384 10*3/uL (ref 150–440)
RBC: 4.34 MIL/uL (ref 3.80–5.20)
RDW: 14.6 % — ABNORMAL HIGH (ref 11.5–14.5)
WBC: 11.7 10*3/uL — AB (ref 3.6–11.0)

## 2017-12-28 LAB — SURGICAL PCR SCREEN
MRSA, PCR: NEGATIVE
STAPHYLOCOCCUS AUREUS: POSITIVE — AB

## 2017-12-28 LAB — BASIC METABOLIC PANEL
Anion gap: 13 (ref 5–15)
BUN: 35 mg/dL — ABNORMAL HIGH (ref 6–20)
CHLORIDE: 96 mmol/L — AB (ref 101–111)
CO2: 26 mmol/L (ref 22–32)
Calcium: 9.4 mg/dL (ref 8.9–10.3)
Creatinine, Ser: 1.14 mg/dL — ABNORMAL HIGH (ref 0.44–1.00)
GFR calc non Af Amer: 47 mL/min — ABNORMAL LOW (ref >60)
GFR, EST AFRICAN AMERICAN: 54 mL/min — AB (ref >60)
Glucose, Bld: 105 mg/dL — ABNORMAL HIGH (ref 65–99)
POTASSIUM: 4 mmol/L (ref 3.5–5.1)
SODIUM: 135 mmol/L (ref 135–145)

## 2017-12-28 LAB — TYPE AND SCREEN
ABO/RH(D): A NEG
ANTIBODY SCREEN: NEGATIVE

## 2017-12-28 NOTE — Patient Instructions (Addendum)
Your procedure is scheduled on:  Friday January 07, 2018 Report to Same Day Surgery 2nd floor medical mall ([place] Entrance-take elevator on left to 2nd floor.  Check in with surgery information desk.) To find out your arrival time please call [phone] between 1PM - 3PM on Thursday January 06, 2018  Remember: Instructions that are not followed completely may result in serious medical risk, up to and including death, or upon the discretion of your surgeon and anesthesiologist your surgery may need to be rescheduled.    _x___ 1. Do not eat food after midnight the night before your procedure. You may drink clear liquids up to 2 hours before you are scheduled to arrive at the hospital for your procedure.  Do not drink clear liquids within 2 hours of your scheduled arrival to the hospital.  Clear liquids include  --Water or Apple juice without pulp  --Clear carbohydrate beverage such as Gatorade  --Black Coffee or Clear Tea (No milk, no creamers, do not add anything to the coffee or tea Type 1 and type 2 diabetics should only drink water.  No gum chewing or hard candies.     __x__ 2. No Alcohol for 24 hours before or after surgery.   __x__3. No Smoking or e-cigarettes for 24 prior to surgery.  Do not use any chewable tobacco products for at least 6 hour prior to surgery   ____  4. Bring all medications with you on the day of surgery if instructed.    __x__ 5. Notify your doctor if there is any change in your medical condition     (cold, fever, infections).    x___6. On the morning of surgery brush your teeth with toothpaste and water.  You may rinse your mouth with mouth wash if you wish.  Do not swallow any toothpaste or mouthwash.   Do not wear jewelry, make-up, hairpins, clips or nail polish.  Do not wear lotions, powders, or perfumes. You may wear deodorant.  Do not shave 48 hours prior to surgery.   Do not bring valuables to the hospital.    [facility] is not responsible for  any belongings or valuables.               Contacts, dentures or bridgework may not be worn into surgery.  Leave your suitcase in the car. After surgery it may be brought to your room.  For patients admitted to the hospital, discharge time is determined by your treatment team.  Patients discharged the day of surgery will not be allowed to drive home.  You will need someone to drive you home and stay with you the night of your procedure.    Please read over the following fact sheets that you were given:   [facility] Preparing for Surgery and or MRSA Information   _x___ Take anti-hypertensive listed below, cardiac, seizure, asthma,     anti-reflux and psychiatric medicines. These include:  1. Diltiazem/Cardizem  2. Ezetimibe/Zetia  3. Levothyroxine/Synthroid  4. Pravastatin/Pravachol  5. Prednisone/Deltasone  6.Sertraline/Zoloft  7. Ursodiol/Actigall   _x___ Use CHG Soap or sage wipes as directed on instruction sheet   _x___ Use Advair and Albuterol inhalers on the day of surgery and bring to hospital day of surgery.  _x___ Follow recommendations from Cardiologist, Pulmonologist or PCP regarding stopping Aspirin, Coumadin, Plavix ,Eliquis, Effient, or Pradaxa, and Pletal. Per Dr. Nehemiah Massed, stop Aspirin, Warfarin/Coumadin 3 days prior to surgery, January 04, 2018.  _x___Stop Anti-inflammatories such as Advil, Aleve, Ibuprofen,  Motrin, Naproxen, Naprosyn, Goodies powders or aspirin products. OK to take Tylenol and Celebrex.   _x___ Stop supplements until after surgery.  But may continue Vitamin D, Vitamin B, and multivitamin.

## 2017-12-29 NOTE — Pre-Procedure Instructions (Signed)
Labs faxed to dr Marry Guan

## 2017-12-30 LAB — URINE CULTURE

## 2017-12-30 NOTE — Pre-Procedure Instructions (Signed)
FAXED UC TO DR [name]

## 2018-01-03 NOTE — Pre-Procedure Instructions (Signed)
SPOKE WITH PATIENT AND DAUGHTER Adriana Martin, RE REPEAT UC  ORDER BY DR PATEL. SEEING DR PATEL IN  [facility] OFFICE 01/04/18. FAXED REQUEST TO HAVE SPECIMEN COLLECTED AT THAT VISIT. PHONE ISSUES IN [place] FAXED

## 2018-01-05 NOTE — Pre-Procedure Instructions (Signed)
UA REPEATED AT DR PATEL'S OFFICE 01/04/18 AND NEGATIVE PER DR PATEL'S NOTE

## 2018-01-06 MED ORDER — CEFAZOLIN SODIUM-DEXTROSE 2-4 GM/100ML-% IV SOLN
2.0000 g | Freq: Once | INTRAVENOUS | Status: AC
  Administered 2018-01-07: 2 g via INTRAVENOUS
  Filled 2018-01-06: qty 100

## 2018-01-07 ENCOUNTER — Encounter
Admission: RE | Disposition: A | Discharge status: Home / Self Care | Payer: Self-pay | Source: Ambulatory Visit | Attending: Orthopedic Surgery

## 2018-01-07 ENCOUNTER — Other Ambulatory Visit: Payer: Self-pay

## 2018-01-07 ENCOUNTER — Inpatient Hospital Stay: Payer: Medicare Other

## 2018-01-07 ENCOUNTER — Inpatient Hospital Stay: Payer: Medicare Other | Admitting: Certified Registered Nurse Anesthetist

## 2018-01-07 ENCOUNTER — Inpatient Hospital Stay
Admission: RE | Admit: 2018-01-07 | Discharge: 2018-01-08 | DRG: 483 | Disposition: A | Discharge status: Home / Self Care | Payer: Medicare Other | Source: Ambulatory Visit | Attending: Orthopedic Surgery | Admitting: Orthopedic Surgery

## 2018-01-07 ENCOUNTER — Encounter: Payer: Self-pay | Admitting: *Deleted

## 2018-01-07 DIAGNOSIS — Z7951 Long term (current) use of inhaled steroids: Secondary | ICD-10-CM

## 2018-01-07 DIAGNOSIS — N183 Chronic kidney disease, stage 3 (moderate): Secondary | ICD-10-CM | POA: Diagnosis present

## 2018-01-07 DIAGNOSIS — Z807 Family history of other malignant neoplasms of lymphoid, hematopoietic and related tissues: Secondary | ICD-10-CM | POA: Diagnosis not present

## 2018-01-07 DIAGNOSIS — Z7982 Long term (current) use of aspirin: Secondary | ICD-10-CM

## 2018-01-07 DIAGNOSIS — M19011 Primary osteoarthritis, right shoulder: Secondary | ICD-10-CM | POA: Diagnosis present

## 2018-01-07 DIAGNOSIS — E039 Hypothyroidism, unspecified: Secondary | ICD-10-CM | POA: Diagnosis present

## 2018-01-07 DIAGNOSIS — Z8249 Family history of ischemic heart disease and other diseases of the circulatory system: Secondary | ICD-10-CM | POA: Diagnosis not present

## 2018-01-07 DIAGNOSIS — K743 Primary biliary cirrhosis: Secondary | ICD-10-CM | POA: Diagnosis present

## 2018-01-07 DIAGNOSIS — M75121 Complete rotator cuff tear or rupture of right shoulder, not specified as traumatic: Secondary | ICD-10-CM | POA: Diagnosis present

## 2018-01-07 DIAGNOSIS — Z7952 Long term (current) use of systemic steroids: Secondary | ICD-10-CM | POA: Diagnosis not present

## 2018-01-07 DIAGNOSIS — G8929 Other chronic pain: Secondary | ICD-10-CM | POA: Diagnosis present

## 2018-01-07 DIAGNOSIS — E782 Mixed hyperlipidemia: Secondary | ICD-10-CM | POA: Diagnosis present

## 2018-01-07 DIAGNOSIS — K219 Gastro-esophageal reflux disease without esophagitis: Secondary | ICD-10-CM | POA: Diagnosis present

## 2018-01-07 DIAGNOSIS — Z888 Allergy status to other drugs, medicaments and biological substances status: Secondary | ICD-10-CM | POA: Diagnosis not present

## 2018-01-07 DIAGNOSIS — Z800 Family history of malignant neoplasm of digestive organs: Secondary | ICD-10-CM

## 2018-01-07 DIAGNOSIS — Z7901 Long term (current) use of anticoagulants: Secondary | ICD-10-CM | POA: Diagnosis not present

## 2018-01-07 DIAGNOSIS — Z90710 Acquired absence of both cervix and uterus: Secondary | ICD-10-CM

## 2018-01-07 DIAGNOSIS — Z96619 Presence of unspecified artificial shoulder joint: Secondary | ICD-10-CM

## 2018-01-07 DIAGNOSIS — I480 Paroxysmal atrial fibrillation: Secondary | ICD-10-CM | POA: Diagnosis present

## 2018-01-07 DIAGNOSIS — I5032 Chronic diastolic (congestive) heart failure: Secondary | ICD-10-CM | POA: Diagnosis present

## 2018-01-07 DIAGNOSIS — Z87891 Personal history of nicotine dependence: Secondary | ICD-10-CM

## 2018-01-07 DIAGNOSIS — J449 Chronic obstructive pulmonary disease, unspecified: Secondary | ICD-10-CM | POA: Diagnosis present

## 2018-01-07 DIAGNOSIS — Z803 Family history of malignant neoplasm of breast: Secondary | ICD-10-CM | POA: Diagnosis not present

## 2018-01-07 DIAGNOSIS — Z853 Personal history of malignant neoplasm of breast: Secondary | ICD-10-CM

## 2018-01-07 DIAGNOSIS — Z96611 Presence of right artificial shoulder joint: Secondary | ICD-10-CM

## 2018-01-07 DIAGNOSIS — Z79890 Hormone replacement therapy (postmenopausal): Secondary | ICD-10-CM | POA: Diagnosis not present

## 2018-01-07 DIAGNOSIS — Z923 Personal history of irradiation: Secondary | ICD-10-CM

## 2018-01-07 DIAGNOSIS — I130 Hypertensive heart and chronic kidney disease with heart failure and stage 1 through stage 4 chronic kidney disease, or unspecified chronic kidney disease: Secondary | ICD-10-CM | POA: Diagnosis present

## 2018-01-07 DIAGNOSIS — Z950 Presence of cardiac pacemaker: Secondary | ICD-10-CM

## 2018-01-07 DIAGNOSIS — Z953 Presence of xenogenic heart valve: Secondary | ICD-10-CM

## 2018-01-07 HISTORY — PX: BICEPT TENODESIS: SHX5116

## 2018-01-07 HISTORY — PX: REVERSE SHOULDER ARTHROPLASTY: SHX5054

## 2018-01-07 LAB — GLUCOSE, CAPILLARY: GLUCOSE-CAPILLARY: 125 mg/dL — AB (ref 65–99)

## 2018-01-07 LAB — PROTIME-INR
INR: 1.15
PROTHROMBIN TIME: 14.6 s (ref 11.4–15.2)

## 2018-01-07 LAB — ABO/RH: ABO/RH(D): A NEG

## 2018-01-07 SURGERY — ARTHROPLASTY, SHOULDER, TOTAL, REVERSE
Anesthesia: General | Site: Shoulder | Laterality: Right | Wound class: Clean

## 2018-01-07 MED ORDER — OXYCODONE HCL 5 MG PO TABS: 5.0000 mg | ORAL_TABLET | Freq: Once | ORAL | Status: DC | PRN

## 2018-01-07 MED ORDER — MENTHOL 3 MG MT LOZG
1.0000 | LOZENGE | OROMUCOSAL | Status: DC | PRN
  Filled 2018-01-07: qty 9

## 2018-01-07 MED ORDER — OXYCODONE HCL 5 MG/5ML PO SOLN: 5.0000 mg | Freq: Once | ORAL | Status: DC | PRN

## 2018-01-07 MED ORDER — DIPHENHYDRAMINE HCL 12.5 MG/5ML PO ELIX: 12.5000 mg | ORAL_SOLUTION | ORAL | Status: DC | PRN

## 2018-01-07 MED ORDER — ANASTROZOLE 1 MG PO TABS
1.0000 mg | ORAL_TABLET | Freq: Every day | ORAL | Status: DC
  Administered 2018-01-08: 1 mg via ORAL
  Filled 2018-01-07: qty 1

## 2018-01-07 MED ORDER — ALUM & MAG HYDROXIDE-SIMETH 200-200-20 MG/5ML PO SUSP: 30.0000 mL | ORAL | Status: DC | PRN

## 2018-01-07 MED ORDER — ACETAMINOPHEN 10 MG/ML IV SOLN
INTRAVENOUS | Status: DC | PRN
  Administered 2018-01-07: 1000 mg via INTRAVENOUS

## 2018-01-07 MED ORDER — BUPIVACAINE LIPOSOME 1.3 % IJ SUSP
INTRAMUSCULAR | Status: AC
  Filled 2018-01-07: qty 20

## 2018-01-07 MED ORDER — URSODIOL 300 MG PO CAPS
300.0000 mg | ORAL_CAPSULE | Freq: Two times a day (BID) | ORAL | Status: DC
  Administered 2018-01-07 – 2018-01-08 (×2): 300 mg via ORAL
  Filled 2018-01-07 (×3): qty 1

## 2018-01-07 MED ORDER — MELATONIN 5 MG PO TABS
2.5000 mg | ORAL_TABLET | Freq: Every evening | ORAL | Status: DC | PRN
  Filled 2018-01-07: qty 0.5

## 2018-01-07 MED ORDER — FENTANYL CITRATE (PF) 100 MCG/2ML IJ SOLN
INTRAMUSCULAR | Status: AC
  Filled 2018-01-07: qty 2

## 2018-01-07 MED ORDER — MOMETASONE FURO-FORMOTEROL FUM 100-5 MCG/ACT IN AERO
2.0000 | INHALATION_SPRAY | Freq: Two times a day (BID) | RESPIRATORY_TRACT | Status: DC
  Administered 2018-01-07: 2 via RESPIRATORY_TRACT
  Filled 2018-01-07: qty 8.8

## 2018-01-07 MED ORDER — PREDNISONE 10 MG PO TABS
5.0000 mg | ORAL_TABLET | Freq: Every day | ORAL | Status: DC
  Administered 2018-01-08: 5 mg via ORAL
  Filled 2018-01-07: qty 1

## 2018-01-07 MED ORDER — FAMOTIDINE 20 MG PO TABS
ORAL_TABLET | ORAL | Status: AC
  Administered 2018-01-07: 20 mg via ORAL
  Filled 2018-01-07: qty 1

## 2018-01-07 MED ORDER — METHOCARBAMOL 1000 MG/10ML IJ SOLN
500.0000 mg | Freq: Four times a day (QID) | INTRAVENOUS | Status: DC | PRN
  Filled 2018-01-07: qty 5

## 2018-01-07 MED ORDER — ONDANSETRON HCL 4 MG/2ML IJ SOLN
INTRAMUSCULAR | Status: DC | PRN
  Administered 2018-01-07: 4 mg via INTRAVENOUS

## 2018-01-07 MED ORDER — PHENYLEPHRINE HCL 10 MG/ML IJ SOLN: INTRAMUSCULAR | Status: DC | PRN

## 2018-01-07 MED ORDER — SODIUM CHLORIDE 0.9 % IV SOLN
INTRAVENOUS | Status: DC
  Administered 2018-01-07: 07:00:00 via INTRAVENOUS

## 2018-01-07 MED ORDER — BUPIVACAINE-EPINEPHRINE (PF) 0.25% -1:200000 IJ SOLN
INTRAMUSCULAR | Status: AC
  Filled 2018-01-07: qty 30

## 2018-01-07 MED ORDER — CEFAZOLIN SODIUM-DEXTROSE 2-4 GM/100ML-% IV SOLN
2.0000 g | Freq: Four times a day (QID) | INTRAVENOUS | Status: AC
  Administered 2018-01-07 – 2018-01-08 (×3): 2 g via INTRAVENOUS
  Filled 2018-01-07 (×3): qty 100

## 2018-01-07 MED ORDER — DEXAMETHASONE SODIUM PHOSPHATE 10 MG/ML IJ SOLN
INTRAMUSCULAR | Status: DC | PRN
  Administered 2018-01-07: 10 mg via INTRAVENOUS

## 2018-01-07 MED ORDER — OXYCODONE HCL 5 MG PO TABS: 10.0000 mg | ORAL_TABLET | ORAL | Status: DC | PRN

## 2018-01-07 MED ORDER — FAMOTIDINE 20 MG PO TABS
20.0000 mg | ORAL_TABLET | Freq: Once | ORAL | Status: AC
  Administered 2018-01-07: 20 mg via ORAL

## 2018-01-07 MED ORDER — BUPIVACAINE-EPINEPHRINE (PF) 0.25% -1:200000 IJ SOLN
INTRAMUSCULAR | Status: DC | PRN
  Administered 2018-01-07: 30 mL via PERINEURAL

## 2018-01-07 MED ORDER — BISACODYL 10 MG RE SUPP: 10.0000 mg | Freq: Every day | RECTAL | Status: DC | PRN

## 2018-01-07 MED ORDER — FUROSEMIDE 40 MG PO TABS
40.0000 mg | ORAL_TABLET | Freq: Every day | ORAL | Status: DC
  Administered 2018-01-08: 40 mg via ORAL
  Filled 2018-01-07: qty 1

## 2018-01-07 MED ORDER — TRANEXAMIC ACID 1000 MG/10ML IV SOLN
1000.0000 mg | Freq: Once | INTRAVENOUS | Status: AC
  Administered 2018-01-07: 1000 mg via INTRAVENOUS
  Filled 2018-01-07: qty 10

## 2018-01-07 MED ORDER — OXYCODONE HCL 5 MG PO TABS
5.0000 mg | ORAL_TABLET | ORAL | Status: DC | PRN
  Administered 2018-01-07 (×2): 5 mg via ORAL
  Filled 2018-01-07 (×2): qty 1

## 2018-01-07 MED ORDER — VANCOMYCIN HCL 1000 MG IV SOLR
INTRAVENOUS | Status: AC
Start: 2018-01-07 — End: 2018-01-07
  Filled 2018-01-07: qty 1000

## 2018-01-07 MED ORDER — ALBUTEROL SULFATE HFA 108 (90 BASE) MCG/ACT IN AERS: 1.0000 | INHALATION_SPRAY | Freq: Four times a day (QID) | RESPIRATORY_TRACT | Status: DC | PRN

## 2018-01-07 MED ORDER — PHENYLEPHRINE HCL 10 MG/ML IJ SOLN
INTRAMUSCULAR | Status: DC | PRN
  Administered 2018-01-07 (×4): 100 ug via INTRAVENOUS

## 2018-01-07 MED ORDER — ALBUTEROL SULFATE (2.5 MG/3ML) 0.083% IN NEBU: 2.5000 mg | INHALATION_SOLUTION | RESPIRATORY_TRACT | Status: DC | PRN

## 2018-01-07 MED ORDER — PHENOL 1.4 % MT LIQD
1.0000 | OROMUCOSAL | Status: DC | PRN
  Filled 2018-01-07: qty 177

## 2018-01-07 MED ORDER — VANCOMYCIN HCL 1000 MG IV SOLR
INTRAVENOUS | Status: DC | PRN
  Administered 2018-01-07: 1000 mg

## 2018-01-07 MED ORDER — WARFARIN - PHYSICIAN DOSING INPATIENT: Freq: Every day | Status: DC

## 2018-01-07 MED ORDER — FENTANYL CITRATE (PF) 100 MCG/2ML IJ SOLN
INTRAMUSCULAR | Status: DC | PRN
  Administered 2018-01-07: 50 ug via INTRAVENOUS
  Administered 2018-01-07: 100 ug via INTRAVENOUS
  Administered 2018-01-07 (×3): 50 ug via INTRAVENOUS

## 2018-01-07 MED ORDER — DOCUSATE SODIUM 100 MG PO CAPS
100.0000 mg | ORAL_CAPSULE | Freq: Two times a day (BID) | ORAL | Status: DC
  Administered 2018-01-07 – 2018-01-08 (×2): 100 mg via ORAL
  Filled 2018-01-07 (×2): qty 1

## 2018-01-07 MED ORDER — SODIUM CHLORIDE 0.9 % IV SOLN
INTRAVENOUS | Status: DC | PRN
  Administered 2018-01-07: 30 ug/min via INTRAVENOUS

## 2018-01-07 MED ORDER — SERTRALINE HCL 50 MG PO TABS
100.0000 mg | ORAL_TABLET | Freq: Every day | ORAL | Status: DC
  Administered 2018-01-08: 100 mg via ORAL
  Filled 2018-01-07: qty 2

## 2018-01-07 MED ORDER — TRANEXAMIC ACID 1000 MG/10ML IV SOLN
INTRAVENOUS | Status: DC | PRN
  Administered 2018-01-07: 1000 mg via INTRAVENOUS

## 2018-01-07 MED ORDER — PROPOFOL 10 MG/ML IV BOLUS
INTRAVENOUS | Status: DC | PRN
  Administered 2018-01-07: 40 mg via INTRAVENOUS
  Administered 2018-01-07: 100 mg via INTRAVENOUS

## 2018-01-07 MED ORDER — PRAVASTATIN SODIUM 20 MG PO TABS
20.0000 mg | ORAL_TABLET | Freq: Every day | ORAL | Status: DC
  Administered 2018-01-07: 20 mg via ORAL
  Filled 2018-01-07: qty 1

## 2018-01-07 MED ORDER — BUPIVACAINE LIPOSOME 1.3 % IJ SUSP
INTRAMUSCULAR | Status: DC | PRN
  Administered 2018-01-07: 20 mL

## 2018-01-07 MED ORDER — METHOCARBAMOL 500 MG PO TABS: 500.0000 mg | ORAL_TABLET | Freq: Four times a day (QID) | ORAL | Status: DC | PRN

## 2018-01-07 MED ORDER — SENNOSIDES-DOCUSATE SODIUM 8.6-50 MG PO TABS: 1.0000 | ORAL_TABLET | Freq: Every evening | ORAL | Status: DC | PRN

## 2018-01-07 MED ORDER — ONDANSETRON HCL 4 MG/2ML IJ SOLN
INTRAMUSCULAR | Status: AC
  Filled 2018-01-07: qty 2

## 2018-01-07 MED ORDER — ROCURONIUM BROMIDE 50 MG/5ML IV SOLN
INTRAVENOUS | Status: AC
Start: 2018-01-07 — End: 2018-01-07
  Filled 2018-01-07: qty 1

## 2018-01-07 MED ORDER — EZETIMIBE 10 MG PO TABS
10.0000 mg | ORAL_TABLET | Freq: Every day | ORAL | Status: DC
  Administered 2018-01-08: 10 mg via ORAL
  Filled 2018-01-07 (×2): qty 1

## 2018-01-07 MED ORDER — HYDROMORPHONE HCL 1 MG/ML IJ SOLN: 0.5000 mg | INTRAMUSCULAR | Status: DC | PRN

## 2018-01-07 MED ORDER — NEOMYCIN-POLYMYXIN B GU 40-200000 IR SOLN
Status: AC
  Filled 2018-01-07: qty 20

## 2018-01-07 MED ORDER — ASPIRIN EC 81 MG PO TBEC
81.0000 mg | DELAYED_RELEASE_TABLET | Freq: Every day | ORAL | Status: DC
  Administered 2018-01-08: 81 mg via ORAL
  Filled 2018-01-07: qty 1

## 2018-01-07 MED ORDER — SODIUM CHLORIDE 0.9 % IV SOLN
INTRAVENOUS | Status: DC
  Administered 2018-01-07: 14:00:00 via INTRAVENOUS

## 2018-01-07 MED ORDER — ACETAMINOPHEN 10 MG/ML IV SOLN
INTRAVENOUS | Status: AC
  Filled 2018-01-07: qty 100

## 2018-01-07 MED ORDER — PROPOFOL 10 MG/ML IV BOLUS
INTRAVENOUS | Status: AC
  Filled 2018-01-07: qty 20

## 2018-01-07 MED ORDER — ALBUTEROL SULFATE HFA 108 (90 BASE) MCG/ACT IN AERS: 2.0000 | INHALATION_SPRAY | RESPIRATORY_TRACT | Status: DC | PRN

## 2018-01-07 MED ORDER — ACETAMINOPHEN 500 MG PO TABS
1000.0000 mg | ORAL_TABLET | Freq: Three times a day (TID) | ORAL | Status: DC
  Administered 2018-01-07 – 2018-01-08 (×3): 1000 mg via ORAL
  Filled 2018-01-07 (×3): qty 2

## 2018-01-07 MED ORDER — ROCURONIUM BROMIDE 100 MG/10ML IV SOLN
INTRAVENOUS | Status: DC | PRN
  Administered 2018-01-07 (×2): 20 mg via INTRAVENOUS
  Administered 2018-01-07: 30 mg via INTRAVENOUS

## 2018-01-07 MED ORDER — LEVOTHYROXINE SODIUM 50 MCG PO TABS
50.0000 ug | ORAL_TABLET | Freq: Every day | ORAL | Status: DC
  Administered 2018-01-08: 50 ug via ORAL
  Filled 2018-01-07: qty 1

## 2018-01-07 MED ORDER — NEOMYCIN-POLYMYXIN B GU 40-200000 IR SOLN
Status: DC | PRN
  Administered 2018-01-07: 14 mL

## 2018-01-07 MED ORDER — LIDOCAINE HCL (PF) 2 % IJ SOLN
INTRAMUSCULAR | Status: AC
  Filled 2018-01-07: qty 10

## 2018-01-07 MED ORDER — SUGAMMADEX SODIUM 200 MG/2ML IV SOLN
INTRAVENOUS | Status: DC | PRN
  Administered 2018-01-07: 149.6 mg via INTRAVENOUS

## 2018-01-07 MED ORDER — DILTIAZEM HCL 30 MG PO TABS
45.0000 mg | ORAL_TABLET | Freq: Four times a day (QID) | ORAL | Status: DC
  Administered 2018-01-07 – 2018-01-08 (×3): 45 mg via ORAL
  Filled 2018-01-07 (×4): qty 2

## 2018-01-07 MED ORDER — DEXAMETHASONE SODIUM PHOSPHATE 10 MG/ML IJ SOLN
INTRAMUSCULAR | Status: AC
Start: 2018-01-07 — End: 2018-01-07
  Filled 2018-01-07: qty 1

## 2018-01-07 MED ORDER — SUGAMMADEX SODIUM 200 MG/2ML IV SOLN
INTRAVENOUS | Status: AC
Start: 2018-01-07 — End: 2018-01-07
  Filled 2018-01-07: qty 2

## 2018-01-07 MED ORDER — CEFAZOLIN SODIUM-DEXTROSE 2-3 GM-%(50ML) IV SOLR
INTRAVENOUS | Status: AC
  Filled 2018-01-07: qty 50

## 2018-01-07 MED ORDER — TRANEXAMIC ACID 1000 MG/10ML IV SOLN
INTRAVENOUS | Status: AC
  Filled 2018-01-07: qty 10

## 2018-01-07 MED ORDER — METOCLOPRAMIDE HCL 10 MG PO TABS: 5.0000 mg | ORAL_TABLET | Freq: Three times a day (TID) | ORAL | Status: DC | PRN

## 2018-01-07 MED ORDER — METOCLOPRAMIDE HCL 5 MG/ML IJ SOLN: 5.0000 mg | Freq: Three times a day (TID) | INTRAMUSCULAR | Status: DC | PRN

## 2018-01-07 MED ORDER — ONDANSETRON HCL 4 MG/2ML IJ SOLN: 4.0000 mg | Freq: Four times a day (QID) | INTRAMUSCULAR | Status: DC | PRN

## 2018-01-07 MED ORDER — ONDANSETRON HCL 4 MG PO TABS: 4.0000 mg | ORAL_TABLET | Freq: Four times a day (QID) | ORAL | Status: DC | PRN

## 2018-01-07 MED ORDER — LISINOPRIL 5 MG PO TABS
5.0000 mg | ORAL_TABLET | Freq: Every day | ORAL | Status: DC
  Administered 2018-01-08: 5 mg via ORAL
  Filled 2018-01-07: qty 1

## 2018-01-07 MED ORDER — LIDOCAINE HCL (CARDIAC) PF 100 MG/5ML IV SOSY
PREFILLED_SYRINGE | INTRAVENOUS | Status: DC | PRN
  Administered 2018-01-07: 60 mg via INTRAVENOUS

## 2018-01-07 MED ORDER — POTASSIUM CHLORIDE CRYS ER 10 MEQ PO TBCR
10.0000 meq | EXTENDED_RELEASE_TABLET | Freq: Two times a day (BID) | ORAL | Status: DC
  Administered 2018-01-07 – 2018-01-08 (×2): 10 meq via ORAL
  Filled 2018-01-07 (×2): qty 1

## 2018-01-07 MED ORDER — FENTANYL CITRATE (PF) 100 MCG/2ML IJ SOLN: 25.0000 ug | INTRAMUSCULAR | Status: DC | PRN

## 2018-01-07 MED ORDER — VITAMIN D (ERGOCALCIFEROL) 1.25 MG (50000 UNIT) PO CAPS: 50000.0000 [IU] | ORAL_CAPSULE | ORAL | Status: DC

## 2018-01-07 MED ORDER — WARFARIN SODIUM 6 MG PO TABS
6.0000 mg | ORAL_TABLET | Freq: Every evening | ORAL | Status: DC
  Administered 2018-01-07: 6 mg via ORAL
  Filled 2018-01-07 (×2): qty 1

## 2018-01-07 MED ORDER — BISACODYL 5 MG PO TBEC: 5.0000 mg | DELAYED_RELEASE_TABLET | Freq: Every day | ORAL | Status: DC | PRN

## 2018-01-07 SURGICAL SUPPLY — 83 items
BASEPLATE P2 COATD GLND 6.5X30 (Shoulder) ×1 IMPLANT
BIT DRILL GUIDE PATIENT MATCH (MISCELLANEOUS) ×1 IMPLANT
BLADE SAGITTAL WIDE XTHICK NO (BLADE) ×3 IMPLANT
CANISTER SUCT 1200ML W/VALVE (MISCELLANEOUS) ×3 IMPLANT
CANISTER SUCT 3000ML PPV (MISCELLANEOUS) ×6 IMPLANT
CHLORAPREP W/TINT 26ML (MISCELLANEOUS) ×3 IMPLANT
CLOSURE WOUND 1/2 X4 (GAUZE/BANDAGES/DRESSINGS)
CNTNR SPEC 2.5X3XGRAD LEK (MISCELLANEOUS)
CONT SPEC 4OZ STER OR WHT (MISCELLANEOUS)
CONTAINER SPEC 2.5X3XGRAD LEK (MISCELLANEOUS) IMPLANT
COOLER POLAR GLACIER W/PUMP (MISCELLANEOUS) ×3 IMPLANT
DRAPE IMP U-DRAPE 54X76 (DRAPES) ×6 IMPLANT
DRAPE INCISE IOBAN 66X45 STRL (DRAPES) ×6 IMPLANT
DRAPE SHEET LG 3/4 BI-LAMINATE (DRAPES) ×6 IMPLANT
DRAPE TABLE BACK 80X90 (DRAPES) ×3 IMPLANT
DRAPE U-SHAPE 47X51 STRL (DRAPES) ×3 IMPLANT
DRILL GUIDE PATIENT MATCH (MISCELLANEOUS) ×3
DRSG OPSITE POSTOP 4X8 (GAUZE/BANDAGES/DRESSINGS) ×6 IMPLANT
DRSG TEGADERM 2-3/8X2-3/4 SM (GAUZE/BANDAGES/DRESSINGS) ×3 IMPLANT
ELECT BLADE 6.5 EXT (BLADE) IMPLANT
ELECT CAUTERY BLADE 6.4 (BLADE) ×3 IMPLANT
ELECT REM PT RETURN 9FT ADLT (ELECTROSURGICAL) ×3
ELECTRODE REM PT RTRN 9FT ADLT (ELECTROSURGICAL) ×1 IMPLANT
GAUZE PETRO XEROFOAM 1X8 (MISCELLANEOUS) ×3 IMPLANT
GLOVE BIOGEL PI IND STRL 8 (GLOVE) ×2 IMPLANT
GLOVE BIOGEL PI INDICATOR 8 (GLOVE) ×4
GLOVE SURG ORTHO 8.0 STRL STRW (GLOVE) ×6 IMPLANT
GOWN STRL REUS W/ TWL LRG LVL3 (GOWN DISPOSABLE) ×2 IMPLANT
GOWN STRL REUS W/ TWL XL LVL3 (GOWN DISPOSABLE) ×1 IMPLANT
GOWN STRL REUS W/TWL LRG LVL3 (GOWN DISPOSABLE) ×4
GOWN STRL REUS W/TWL XL LVL3 (GOWN DISPOSABLE) ×2
HEMOVAC 400CC 10FR (MISCELLANEOUS) IMPLANT
HOOD PEEL AWAY FLYTE STAYCOOL (MISCELLANEOUS) ×12 IMPLANT
INSERT SMALL SOCKET 32MM (Insert) ×3 IMPLANT
KIT STABILIZATION SHOULDER (MISCELLANEOUS) ×3 IMPLANT
MASK FACE SPIDER DISP (MASK) ×3 IMPLANT
MAT BLUE FLOOR 46X72 FLO (MISCELLANEOUS) ×3 IMPLANT
NDL SAFETY ECLIPSE 18X1.5 (NEEDLE) ×3 IMPLANT
NEEDLE HYPO 18GX1.5 SHARP (NEEDLE) ×6
NEEDLE REVERSE CUT 1/2 CRC (NEEDLE) ×3 IMPLANT
NEEDLE SPNL 20GX3.5 QUINCKE YW (NEEDLE) ×3 IMPLANT
NS IRRIG 1000ML POUR BTL (IV SOLUTION) ×3 IMPLANT
P2 COATDE GLNOID BSEPLT 6.5X30 (Shoulder) ×3 IMPLANT
PACK ARTHROSCOPY SHOULDER (MISCELLANEOUS) ×3 IMPLANT
PAD WRAPON POLAR SHDR UNIV (MISCELLANEOUS) ×1 IMPLANT
PASSER SUT SWANSON 36MM LOOP (INSTRUMENTS) IMPLANT
PULSAVAC PLUS IRRIG FAN TIP (DISPOSABLE) ×3
RETRIEVER SUT HEWSON (MISCELLANEOUS) IMPLANT
SCREW BONE LOCKING RSP 5.0X14 (Screw) ×3 IMPLANT
SCREW BONE RSP LOCK 5X14 (Screw) ×1 IMPLANT
SCREW BONE RSP LOCK 5X18 (Screw) ×1 IMPLANT
SCREW BONE RSP LOCK 5X22 (Screw) ×1 IMPLANT
SCREW BONE RSP LOCK 5X26 (Screw) ×1 IMPLANT
SCREW BONE RSP LOCKING 18MM LG (Screw) ×3 IMPLANT
SCREW BONE RSP LOCKING 5.0X26 (Screw) ×3 IMPLANT
SCREW BONE RSP LOCKING 5.0X32 (Screw) ×3 IMPLANT
SCREW RETAIN W/HEAD 4MM OFFSET (Shoulder) ×3 IMPLANT
SLING ULTRA II LG (MISCELLANEOUS) IMPLANT
SLING ULTRA II M (MISCELLANEOUS) ×3 IMPLANT
SPONGE GAUZE 2X2 8PLY STER LF (GAUZE/BANDAGES/DRESSINGS) ×1
SPONGE GAUZE 2X2 8PLY STRL LF (GAUZE/BANDAGES/DRESSINGS) ×2 IMPLANT
SPONGE LAP 18X18 5 PK (GAUZE/BANDAGES/DRESSINGS) ×12 IMPLANT
STAPLER SKIN PROX 35W (STAPLE) IMPLANT
STEM HUMERAL REVERSE S 12X108 (Stem) ×3 IMPLANT
STRAP SAFETY 5IN WIDE (MISCELLANEOUS) ×6 IMPLANT
STRIP CLOSURE SKIN 1/2X4 (GAUZE/BANDAGES/DRESSINGS) IMPLANT
SUT FIBERWIRE #2 38 BLUE 1/2 (SUTURE) ×18
SUT FIBERWIRE #2 38 T-5 BLUE (SUTURE) ×3
SUT MNCRL AB 4-0 PS2 18 (SUTURE) IMPLANT
SUT PROLENE 6 0 P 1 18 (SUTURE) ×3 IMPLANT
SUT TICRON 2-0 30IN 311381 (SUTURE) ×9 IMPLANT
SUT VIC AB 0 CT1 36 (SUTURE) ×6 IMPLANT
SUT VIC AB 2-0 CT2 27 (SUTURE) ×6 IMPLANT
SUTURE FIBERWR #2 38 BLUE 1/2 (SUTURE) ×6 IMPLANT
SUTURE FIBERWR #2 38 T-5 BLUE (SUTURE) ×1 IMPLANT
SYR 20CC LL (SYRINGE) ×3 IMPLANT
SYR 30ML LL (SYRINGE) ×6 IMPLANT
SYR 5ML LL (SYRINGE) ×3 IMPLANT
TIP FAN IRRIG PULSAVAC PLUS (DISPOSABLE) ×1 IMPLANT
TRAY FOLEY CATH SILVER 16FR LF (SET/KITS/TRAYS/PACK) ×3 IMPLANT
TUBING CONNECTING 10 (TUBING) ×2 IMPLANT
TUBING CONNECTING 10' (TUBING) ×1
WRAPON POLAR PAD SHDR UNIV (MISCELLANEOUS) ×3

## 2018-01-07 NOTE — Evaluation (Signed)
Physical Therapy Evaluation Patient Details Name: Adriana Martin MRN: [id number] DOB: Mar 19, 1946 Today's Date: 01/07/2018   History of Present Illness  Patient is a 72 year old female admitted following R shoulder replacement with biceps tenodesis.  PMH includes pacemaker implantation, breast CA, thrombocytopenia, Htn, HLD, heart murmur, HA,, GERD, depression, COPD, CHF, CKD, cirrhosis, atrial fibrillation, anxiety and anemia.  Clinical Impression  Patient is a 72 year old female who lives in a one story home with her granddaughter.  She is independent at baseline without use of AD.  Patient is in bed with shoulder sling donned on R UE upon PT arrival and reports 3/10 pain.    PT provided education regarding use of shoulder polar care and shoulder sling.  PT introduced appropriate there ex and reviewed WB precautions with pt and appropriate active and passive mobility and pt expressed understanding.  She was able to complete bed mobility with VC's for use of L UE and side of bed to exit but no need for physical assist.  Pt was able to complete STS transfer and walk to recliner with CGA and VC's for sequencing and use of SPC.  Pt's BP remained WNL throughout mobilization.  Pt will continue to benefit from skilled PT with focus on strength, ROM, pain management, use of AD and functional mobility.    Follow Up Recommendations Home health PT    Equipment Recommendations  None recommended by PT    Recommendations for Other Services       Precautions / Restrictions Precautions Precautions: Fall Restrictions Weight Bearing Restrictions: Yes RUE Weight Bearing: Non weight bearing      Mobility  Bed Mobility Overal bed mobility: Needs Assistance Bed Mobility: Supine to Sit     Supine to sit: Min guard     General bed mobility comments: Pt was able to initiate bed mobility to EOB with VC's from PT for use of L UE and determining which side of bed to exit.  Transfers Overall transfer  level: Needs assistance Equipment used: Straight cane Transfers: Sit to/from Stand Sit to Stand: Min guard         General transfer comment: Able to initiate and complete STS without physical assist, PT close to prevent falls and monitoring BP. Pt did not present as hypotensive and did not report sx.  Used SPC appropriately.  Ambulation/Gait Ambulation/Gait assistance: Min guard Ambulation Distance (Feet): 5 Feet Assistive device: Straight cane     Gait velocity interpretation: 1.31 - 2.62 ft/sec, indicative of limited community ambulator General Gait Details: Able to walk short distance from bed to chair.  Demonstrated moderate foot clearance and proper use of SPC with min VC's.  Stairs            Wheelchair Mobility    Modified Rankin (Stroke Patients Only)       Balance Overall balance assessment: Modified Independent                                           Pertinent Vitals/Pain Pain Assessment: 0-10 Pain Score: 4  Pain Location: R shoulder Pain Intervention(s): Limited activity within patient's tolerance;Monitored during session    [place] expects to be discharged to:: Private residence Living Arrangements: Children Available Help at Discharge: Available PRN/intermittently;Family Type of Home: Mobile home Home Access: Stairs to enter Entrance Stairs-Rails: Can reach both Entrance Stairs-Number of Steps: 3 Home  Layout: One level Home Equipment: Walker - 2 wheels;Cane - single point      Prior Function Level of Independence: Independent         Comments: Independent with all ADL's prior     Hand Dominance   Dominant Hand: Right    Extremity/Trunk Assessment   Upper Extremity Assessment Upper Extremity Assessment: RUE deficits/detail;Overall [facility] for tasks assessed RUE Deficits / Details: Decreased grip strength, able to perform full wrist flexion/extension ROM. RUE: Unable to fully assess due to  immobilization;Unable to fully assess due to pain RUE Sensation: decreased light touch(1st and 2nd digits)    Lower Extremity Assessment Lower Extremity Assessment: Overall WFL for tasks assessed    Cervical / Trunk Assessment Cervical / Trunk Assessment: Normal  Communication   Communication: No difficulties  Cognition Arousal/Alertness: Awake/alert Behavior During Therapy: WFL for tasks assessed/performed Overall Cognitive Status: Within Functional Limits for tasks assessed                                        General Comments      Exercises General Exercises - Upper Extremity Wrist Flexion: AROM;Right;5 reps;Seated Wrist Extension: AROM;Right;5 reps;Seated Other Exercises Other Exercises: Educated concerning use of polar care and importance of barrier between skin and shoulder pad. Other Exercises: Educated concerning proper fit of brace and assisted pt in adjusting.  Advised pt as to how to use stress ball attached and pt demonstrated understanding.   Assessment/Plan    PT Assessment Patient needs continued PT services  PT Problem List Decreased strength;Decreased mobility;Decreased range of motion;Decreased activity tolerance;Decreased knowledge of use of DME;Impaired sensation;Pain       PT Treatment Interventions DME instruction;Therapeutic activities;Gait training;Therapeutic exercise;Patient/family education;Stair training;Balance training;Functional mobility training;Neuromuscular re-education    PT Goals (Current goals can be found in the Care Plan section)  Acute Rehab PT Goals Patient Stated Goal: To return home and back to generally active lifestyle as soon as possible. PT Goal Formulation: With patient Time For Goal Achievement: 01/21/18 Potential to Achieve Goals: Good    Frequency BID   Barriers to discharge        Co-evaluation               AM-PAC PT "6 Clicks" Daily Activity  Outcome Measure Difficulty turning over in  bed (including adjusting bedclothes, sheets and blankets)?: A Little Difficulty moving from lying on back to sitting on the side of the bed? : A Little Difficulty sitting down on and standing up from a chair with arms (e.g., wheelchair, bedside commode, etc,.)?: A Little Help needed moving to and from a bed to chair (including a wheelchair)?: A Little Help needed walking in hospital room?: A Little Help needed climbing 3-5 steps with a railing? : A Little 6 Click Score: 18    End of Session Equipment Utilized During Treatment: Gait belt;Oxygen Activity Tolerance: Patient tolerated treatment well Patient left: in chair;with call bell/phone within reach;with chair alarm set   PT Visit Diagnosis: Unsteadiness on feet (R26.81);Muscle weakness (generalized) (M62.81);Pain Pain - Right/Left: Right Pain - part of body: Shoulder    Time: 9622-2979 PT Time Calculation (min) (ACUTE ONLY): 25 min   Charges:   PT Evaluation $PT Eval Low Complexity: 1 Low PT Treatments $Therapeutic Activity: 8-22 mins   PT G Codes:   PT G-Codes **NOT FOR INPATIENT CLASS** Functional Assessment Tool Used: AM-PAC 6 Clicks Basic Mobility  Roxanne Gates, PT, DPT  Roxanne Gates 01/07/2018, 4:55 PM

## 2018-01-07 NOTE — Anesthesia Postprocedure Evaluation (Signed)
Anesthesia Post Note  Patient: CAROLANNE MERCIER  Procedure(s) Performed: REVERSE SHOULDER ARTHROPLASTY (Right Shoulder) BICEPS TENODESIS (Right Shoulder)  Patient location during evaluation: PACU Anesthesia Type: General Level of consciousness: awake and alert Pain management: pain level controlled Vital Signs Assessment: post-procedure vital signs reviewed and stable Respiratory status: spontaneous breathing, nonlabored ventilation, respiratory function stable and patient connected to nasal cannula oxygen Cardiovascular status: blood pressure returned to baseline and stable Postop Assessment: no apparent nausea or vomiting Anesthetic complications: no     Last Vitals:  Vitals:   01/07/18 1229 01/07/18 1244  BP: 102/61 111/65  Pulse:  80  Resp:  13  Temp:  36.6 C  SpO2:  99%    Last Pain:  Vitals:   01/07/18 1244  TempSrc:   PainSc: 3                  Precious Haws P[place]it[place]

## 2018-01-07 NOTE — Anesthesia Procedure Notes (Signed)
Procedure Name: Intubation Date/Time: 01/07/2018 7:43 AM Performed by: Eben Burow, CRNA Pre-anesthesia Checklist: Patient identified, Emergency Drugs available, Suction available, Patient being monitored and Timeout performed Patient Re-evaluated:Patient Re-evaluated prior to induction Oxygen Delivery Method: Circle system utilized Preoxygenation: Pre-oxygenation with 100% oxygen Induction Type: IV induction Ventilation: Mask ventilation without difficulty Laryngoscope Size: Miller and 2 Grade View: Grade I Tube type: Oral Tube size: 7.0 mm Number of attempts: 1 Airway Equipment and Method: Stylet and LTA kit utilized Placement Confirmation: ETT inserted through vocal cords under direct vision,  positive ETCO2 and breath sounds checked- equal and bilateral Secured at: 19 cm Tube secured with: Tape Dental Injury: Teeth and Oropharynx as per pre-operative assessment

## 2018-01-07 NOTE — Transfer of Care (Signed)
Immediate Anesthesia Transfer of Care Note  Patient: Adriana Martin  Procedure(s) Performed: REVERSE SHOULDER ARTHROPLASTY (Right Shoulder) BICEPS TENODESIS (Right Shoulder)  Patient Location: PACU  Anesthesia Type:General  Level of Consciousness: awake, alert , oriented and patient cooperative  Airway & Oxygen Therapy: Patient Spontanous Breathing and Patient connected to face mask oxygen  Post-op Assessment: Report given to RN and Post -op Vital signs reviewed and stable  Post vital signs: Reviewed and stable  Last Vitals:  Vitals Value Taken Time  BP 96/59 01/07/2018 11:59 AM  Temp 36.6 C 01/07/2018 11:59 AM  Pulse 82 01/07/2018 12:01 PM  Resp 14 01/07/2018 12:01 PM  SpO2 99 % 01/07/2018 12:01 PM  Vitals shown include unvalidated device data.  Last Pain:  Vitals:   01/07/18 0619  TempSrc: Oral  PainSc: 8          Complications: No apparent anesthesia complications

## 2018-01-07 NOTE — Anesthesia Preprocedure Evaluation (Addendum)
Anesthesia Evaluation  Patient identified by MRN, date of birth, ID band Patient awake    Reviewed: Allergy & Precautions, H&P , NPO status , Patient's Chart, lab work & pertinent test results  History of Anesthesia Complications Negative for: history of anesthetic complications  Airway Mallampati: III  TM Distance: <3 FB Neck ROM: limited    Dental  (+) Poor Dentition, Missing, Edentulous Upper, Edentulous Lower, Upper Dentures, Lower Dentures   Pulmonary shortness of breath and with exertion, COPD,  COPD inhaler and oxygen dependent, former smoker,           Cardiovascular Exercise Tolerance: Poor hypertension, (-) angina+CHF and + DOE  (-) Past MI + pacemaker ([employer]) + Valvular Problems/Murmurs      Neuro/Psych  Headaches, PSYCHIATRIC DISORDERS Anxiety Depression    GI/Hepatic negative GI ROS, Neg liver ROS, GERD  Controlled and Medicated,  Endo/Other  Hypothyroidism   Renal/GU Renal disease     Musculoskeletal   Abdominal   Peds  Hematology negative hematology ROS (+) anemia ,   Anesthesia Other Findings Past Medical History: No date: Anemia No date: Anxiety No date: Atrial fibrillation (HCC) No date: B12 deficiency No date: Breast cancer ([place])     Comment:  RT Lumpectomy c radiation 2012 No date: Breast cancer ([place]) No date: CHF (congestive heart failure) (HCC) No date: Chronic kidney disease No date: Cirrhosis ([place]) No date: COPD (chronic obstructive pulmonary disease) (HCC) No date: Depression No date: GERD (gastroesophageal reflux disease) No date: Headache No date: Heart murmur No date: Hyperlipemia No date: Hyperlipidemia No date: Hypertension No date: Hypothyroidism No date: Iron (Fe) deficiency anemia No date: Presence of permanent cardiac pacemaker No date: Shortness of breath dyspnea No date: Thrombocytopenia ([place]) No date: Vitamin D deficiency  Past Surgical History: No date:  ABDOMINAL HYSTERECTOMY     Comment:  Partial No date: ABLATION No date: APPENDECTOMY 2012: BREAST BIOPSY; Right 2012: BREAST LUMPECTOMY; Right No date: BREAST SURGERY No date: CARDIAC SURGERY No date: CARDIAC VALVE REPLACEMENT 2012: COLONOSCOPY 01/23/2015: ELECTROPHYSIOLOGIC STUDY; N/A     Comment:  Procedure: CARDIOVERSION;  Surgeon: Corey Skains,               MD;  Location: [facility] ORS;  Service: Cardiovascular;                Laterality: N/A; 05/27/2016: ELECTROPHYSIOLOGIC STUDY; N/A     Comment:  Procedure: CARDIOVERSION;  Surgeon: Corey Skains,               MD;  Location: [facility] ORS;  Service: Cardiovascular;                Laterality: N/A; 2012: ESOPHAGOGASTRODUODENOSCOPY No date: EYE SURGERY No date: MITRAL VALVE REPLACEMENT No date: mvp No date: VSD REPAIR  BMI    Body Mass Index:  30.18 kg/m      Reproductive/Obstetrics negative OB ROS                            Anesthesia Physical Anesthesia Plan  ASA: III  Anesthesia Plan: General ETT   Post-op Pain Management:    Induction: Intravenous  PONV Risk Score and Plan: Ondansetron, Dexamethasone, Midazolam and Treatment may vary due to age or medical condition  Airway Management Planned: Oral ETT  Additional Equipment:   Intra-op Plan:   Post-operative Plan: Extubation in OR  Informed Consent: I have reviewed the patients History and Physical, chart, labs and discussed the  procedure including the risks, benefits and alternatives for the proposed anesthesia with the patient or authorized representative who has indicated his/her understanding and acceptance.   Dental Advisory Given  Plan Discussed with: Anesthesiologist, CRNA and Surgeon  Anesthesia Plan Comments: (Consented for post op PNB PRN  Patient has cardiac clearance for this procedure.   Patient consented for risks of anesthesia including but not limited to:  - adverse reactions to medications - damage to teeth, lips  or other oral mucosa - sore throat or hoarseness - Damage to heart, brain, lungs or loss of life  Patient voiced understanding.)      Anesthesia Quick Evaluation

## 2018-01-07 NOTE — H&P (Signed)
Paper H&P to be scanned into permanent record. H&P reviewed. No significant changes noted.  Heart: regular rate and rhythm Lungs: chest sounds clear   

## 2018-01-07 NOTE — Anesthesia Post-op Follow-up Note (Signed)
Anesthesia QCDR form completed.        

## 2018-01-07 NOTE — Op Note (Signed)
Operative Note   SURGERY DATE: 01/07/2018  PRE-OP DIAGNOSIS:  1. Right shoulder rotator cuff arthropathy  POST-OP DIAGNOSIS: 1. Right shoulder rotator cuff arthropathy  PROCEDURES:  1. Right reverse total shoulder arthroplasty 2. Right biceps tenodesis  SURGEON: Cato Mulligan, MD  ANESTHESIA: Gen   ESTIMATED BLOOD LOSS: 350cc  TOTAL IV FLUIDS: See anesthesia record  IMPLANTS: [employer]: RSP Glenoid Head w/Retaining screw 32-4; Monoblock Reverse Shoulder Baseplate with 1.5XY central screw; 3 locking screws into baseplate (58PF, 29WK, 46KM); Small Shell Humeral Stem 12 x151m; Neutral +4 Small Socket Insert.  INDICATION(S):  Adriana BATLEYis a 72y.o. female with chronic shoulder pain with inability to lift arm overhead. Imaging consistent with massive, irreparable rotator cuff tear with significant atrophy. Conservative measures including medications, therapy, and cortisone injections have not provided adequate relief.  She received preoperative clearance from her cardiologist and nephrologist.  After discussion of risks, benefits, and alternatives to surgery, the patient elected to proceed with reverse shoulder arthroplasty and biceps tenodesis.  OPERATIVE FINDINGS: massive rotator cuff tear (complete supraspinatus, almost complete infraspinatus, partial subscapularis)  OPERATIVE REPORT:  I identified [place] J Cecilin the pre-operative holding area. Informed consent was obtained and the surgical site was marked. I reviewed the risks and benefits of the proposed surgical intervention and the patient (and/or patient's guardian) wished to proceed.  The patient was transferred to the operative suite and general anesthesia was administered. The patient was placed in the beach chair position with the head of the bed elevated approximately 45 degrees. All down side pressure points were appropriately padded. Pre-op exam under anesthesia confirmed some stiffness and  crepitus. Appropriate IV antibioticswere administered. Tranexamic acid was also administered after verifying that the patient had no contraindications. The extremity was then prepped and draped in standard fashion. A time out was performed confirming the correct extremity, correct patient, and correct procedure.   We used the standard deltopectoral incision from the coracoid to ~12cm distal. We found the cephalic vein and took it laterally. We opened the deltopectoral interval widely and placed retractors under the CA ligament in the subacromial space and under the deltoid tendon at its insertion. We then abducted and internally rotated the arm and released the underlying bursa between these retractors, taking care not to damage the circumflex branch of the axillary nerve.   Next, we brought the arm back in adduction at slight forward flexion with external rotation. We opened the clavipectoral fascia lateral to the conjoint tendon. We gently palpated the axillary nerve and verified its position and continuity on both sides of the humerus with a Tug test. Note, this test was repeated multiple times during the procedure for nerve localization and confirmed to be intact at the end of the case. We then cauterized the anterior humeral circumflex ("Three sisters") vessels. The arm was then internally rotated, we cut the falciform ligament at approximately 1 cm of the upper portion of the pectoralis major insertion. Next we unroofed the bicipital groove.  It was severely tendinopathic and thickened proximally.  We proceeded with a soft tissue biceps tenodesis given the pathology of the tendon.  After opening the biceps tendon sheath all the way to the supraglenoid tubercle, we performed a biceps tenodesis with two #2 TiCron sutures to the upper border of the pectoralis major. The proximal portion of the tendon was excised.   At this point, we could see that the supraspinatus was completely torn with a bald humeral  head superiorly. The subscapularis also  had a partial tear of the superior fibers. We performed a subscapularis peel using electrocautery to remove the anterior capsule and subscapularis off of the humeral head. We released the inferior capsule from the humerus all the way to the posterior band of the inferior glenohumeral ligament. When this was complete we gently dislocated the shoulder up into the wound. We removed any osteophytes and made our cut with the appropriate inclination in 30 degrees of retroversion.  We then turned our attention back to the glenoid. The proximal humerus was retracted posteriorly. The anterior capsule was dissected free from the subscapularis. The anterior capsule was then excised, exposing the anterior glenoid. We then grasped the labrum and removed it circumferentially. During the glenoid exposure, the axillary nerve was protected the entire time.    A patient-specific guide was used to drill the central guidepin. A tap was placed, matching the trajectory of the guidepin. An appropriately sized reamer was used to ream the glenoid. The monoblock baseplate was inserted and excellent fixation was achieved such that the entire scapula rotated with further attempted seating of the baseplate.  3 peripheral screws were drilled, measured, and placed.  The anterior screw was not placed due to minimal bony fixation anteriorly.  A 32-4 glenosphere was then placed and tightened.   We then turned our attention back to the humerus. We sized for a small-shell prosthesis. We sequentially used larger diameter canal finders until we met significant resistance and sequential broaching was performed to this size listed above. We trialed both a 0 and +4 poly. The humerus was trialed and noted to have satisfactory stability, motion, and deltoid tension with a +4 poly. We drilled 3 holes and passed 3 #2 Fiberwire sutures through for our subscapularis repair. The final humeral component was then  inserted. We placed a +4 poly. The humerus was reduced and final confirmation of motion, tension, and stability were satisfactory. A Hemovac drain was placed. Subscapularis was repaired with the previously passed #2 FiberWire sutures after passing them through the subscapularis.   We again verified the tension on the axillary nerve, appropriate range of motion, stability of the implant, and security of the subscapularis repair. We closed the deltopectoral interval deep to the cephalic vein with a running, 0-Vicryl suture. The skin was closed with 2-0 Vicryl and staples. Xeroform and Honeycomb dressing was applied. A PolarCare unit and sling were placed. Patient was extubated, transferred to a stretcher bed and to the post anesthesia care unit in stable condition.    POSTOPERATIVE PLAN: The patient will be admitted with plan for discharge home on POD#1. Operative arm to remain in sling at all times except RoM exercises and hygiene. Can perform pendulums, elbow/wrist/hand RoM exercises. Passive RoM allowed to 90 FF and 30 ER.  Resume home anticoagulation with Coumadin starting on evening of surgery. Plan for outpatient PT starting on POD #3-4. Patient to return to clinic in ~2 weeks for post-operative appointment.

## 2018-01-08 LAB — BASIC METABOLIC PANEL
Anion gap: 8 (ref 5–15)
BUN: 31 mg/dL — ABNORMAL HIGH (ref 6–20)
CALCIUM: 8.8 mg/dL — AB (ref 8.9–10.3)
CO2: 27 mmol/L (ref 22–32)
CREATININE: 1.23 mg/dL — AB (ref 0.44–1.00)
Chloride: 99 mmol/L — ABNORMAL LOW (ref 101–111)
GFR, EST AFRICAN AMERICAN: 50 mL/min — AB (ref >60)
GFR, EST NON AFRICAN AMERICAN: 43 mL/min — AB (ref >60)
GLUCOSE: 142 mg/dL — AB (ref 65–99)
Potassium: 4.4 mmol/L (ref 3.5–5.1)
Sodium: 134 mmol/L — ABNORMAL LOW (ref 135–145)

## 2018-01-08 LAB — CBC
HCT: 31.7 % — ABNORMAL LOW (ref 35.0–47.0)
Hemoglobin: 10.8 g/dL — ABNORMAL LOW (ref 12.0–16.0)
MCH: 30.2 pg (ref 26.0–34.0)
MCHC: 34 g/dL (ref 32.0–36.0)
MCV: 88.9 fL (ref 80.0–100.0)
PLATELETS: 312 10*3/uL (ref 150–440)
RBC: 3.56 MIL/uL — ABNORMAL LOW (ref 3.80–5.20)
RDW: 14.3 % (ref 11.5–14.5)
WBC: 14.7 10*3/uL — ABNORMAL HIGH (ref 3.6–11.0)

## 2018-01-08 MED ORDER — OXYCODONE HCL 5 MG PO TABS: 5.0000 mg | ORAL_TABLET | ORAL | 0 refills | Status: DC | PRN

## 2018-01-08 NOTE — Progress Notes (Signed)
DISCHARGE NOTE:  Pt given discharge instructions and prescription. Pt verbalized understanding. Pt wheeled to car by staff.

## 2018-01-08 NOTE — Discharge Instructions (Signed)
Adriana Mulligan, MD  [facility]  Phone: [phone]  Fax: [phone]   Discharge Instructions after Reverse Shoulder Replacement   1. Activity/Sling: You are to be non-weight bearing on operative extremity. A sling/shoulder immobilizer has been provided for you. Only remove the sling to perform elbow, wrist, and hand RoM exercises and hygiene/dressing. Active reaching and lifting are not permitted. You will be given further instructions on sling use at your first physical therapy visit and postoperative visit with Dr. [name].   2. Dressings: Dressing may be removed at 1st physical therapy visit (~3-4 days after surgery). Afterwards, you may either leave open to air (if no drainage) or cover with dry, sterile dressing. If you have steri-strips on your wound, please do not remove them. They will fall off on their own. You may shower 5 days after surgery. Please pat incision dry. Do not rub or place any shear forces across incision. If there is drainage or any opening of incision after 5 days, please notify our offices immediately.   3. Driving:  Plan on not driving for six weeks. Please note that you are advised NOT to drive while taking narcotic pain medications as you may be impaired and unsafe to drive.  4. Medications:  - You have been provided a prescription for narcotic pain medicine (usually oxycodone). After surgery, take 1-2 narcotic tablets every 4 hours if needed for severe pain. Please start this as soon as you begin to start having pain (if you received a nerve block, start taking as soon as this wears off).  - A prescription for anti-nausea medication will be provided in case the narcotic medicine causes nausea - take 1 tablet every 6 hours only if nauseated.  - Re-start home Coumadin. No other DVT prophylaxis required.  -Take tylenol 1000mg  (2 Extra strength or 3 regular strength tablets) every 8 hours for pain. This will reduce the amount of narcotic medication needed. May  stop tylenol when you are having minimal pain. - Take a stool softener (Colace, Dulcolax or Senakot) if you are using narcotic pain medications to help with constipation that is associated with narcotic use. - DO NOT take ANY nonsteroidal anti-inflammatory pain medications: Advil, Motrin, Ibuprofen, Aleve, Naproxen, or Naprosyn.  If you are taking prescription medication for anxiety, depression, insomnia, muscle spasm, chronic pain, or for attention deficit disorder you are advised that you are at a higher risk of adverse effects with use of narcotics post-op, including narcotic addiction/dependence, depressed breathing, death. If you use non-prescribed substances: alcohol, marijuana, cocaine, heroin, methamphetamines, etc., you are at a higher risk of adverse effects with use of narcotics post-op, including narcotic addiction/dependence, depressed breathing, death. You are advised that taking > 50 morphine milligram equivalents (MME) of narcotic pain medication per day results in twice the risk of overdose or death. For your prescription provided: oxycodone 5 mg - taking more than 6 tablets per day after the first few days of surgery.  5. Physical Therapy: 1-2 times per week for ~12 weeks. Therapy typically starts on post operative Day 3 or 4. You have been provided an order for physical therapy. The therapist will provide home exercises. Please contact our offices if this appointment has not been scheduled.   6. Work: May do light duty/desk job in approximately 2 weeks when off of narcotics, pain is well-controlled, and swelling has decreased if able to function with one arm in sling. Full work may take 6 weeks if light motions and function of both arms is  required. Lifting jobs may require 12 weeks.  7. Post-Op Appointments: Your first post-op appointment will be with Dr. [name] in approximately 2 weeks time.   If you find that they have not been scheduled please call the Orthopaedic  Appointment front desk at [phone].                               Adriana Mulligan, MD [facility] Phone: [phone] Fax: [phone]   REVERSE SHOULDER ARTHROPLASTY REHAB GUIDELINES   These guidelines should be tailored to individual patients based on their rehab goals, age, precautions, quality of repair, etc.  Progression should be based on patient progress and approval by the referring physician.  PHASE 1 - Day 1 through Week 2  GENERAL GUIDELINES AND PRECAUTIONS  Sling wear 24/7 except during grooming and home exercises (3 to 5 times daily)  Avoid shoulder extension such that the arm is posterior the frontal plane.  When patients recline, a pillow should be placed behind the upper arm and sling should be on.  They should be advised to always be able to see the elbow  Avoid combined IR/ADD/EXT, such as hand behind back to prevent dislocation  Avoid combined IR and ADD such as reaching across the chest to prevent dislocation  No AROM  No submersion in pool/water for 4 weeks  No weight bearing through operative arm (as in transfers, walker use, etc)  GOALS  Maintain integrity of joint replacement; protect soft tissue healing  Increase PROM for elevation to 120 and ER to 30 (will remain the goal for first 6 weeks)  Optimize distal UE circulation and muscle activity (elbow, wrist and hand)  Instruct in use of sling for proper fit, polar care device for ice application after HEP, signs/symptoms of infection  EXERCISES  Active elbow, wrist and hand  Passive forward elevation in scapular plane to 90-120 max motion; ER in scapular plane to 30  Active scapular retraction with arms resting in neutral position  CRITERIA TO PROGRESS TO PHASE 2  Low pain (less than 3/10) with shoulder PROM  Healing of incision without signs of infection  Clearance by MD to advance after 2 week MD check up  PHASE 2 - 2 weeks - 6 weeks  GENERAL  GUIDELINES AND PRECAUTIONS  Sling may be removed while at home; worn in community without abduction pillow  May use arm for light activities of daily living (such as feeding, brushing teeth, dressing) with elbow near  the side of the body  and arm in front of the body- no active lifting of the arm  May submerge in water (tub, pool, [place], etc) after 4 weeks  Continue to avoid WBing through the operative arm  Continue to avoid combined IR/EXT/ADD (hand behind the back) and IR/ADD  (reaching across chest) for dislocation precautions  GOALS   Achieve passive elevation to 120 and ER to 30   Low (less than 3/10) to no pain   Ability to fire all heads of the deltoid  EXERCISES  May discontinue grip, and active elbow and wrist exercises since using the arm in ADLs  with sling removed around the home  Continue passive elevation to 120 and ER to 30, both in scapular plane with arm supported on table top  Add submaximal isometrics, pain free effort, for all functional heads of deltoid (anterior, posterior, middle)  Ensure that with posterior deltoid isometric the shoulder does not move into  extension and the arm remains anterior the frontal plane  At 4 weeks:  begin to place arm in balanced position of 90 deg elevation in supine; when patient able to hold this position with ease, may begin reverse pendulums clockwise and counterclockwise  CRITERIA TO PROGRESS TO PHASE 3  Passive forward elevation in scapular plane to 120; passive ER in scapular plane to 30  Ability to fire isometrically all heads of the deltoid muscle without pain  Ability to place and hold the arm in balanced position (90 deg elevation in supine)  PHASE 3 - 6 weeks to 3 months  GENERAL GUIDELINES AND PRECAUTIONS  Discontinue use of sling  Avoid forcing end range motion in any direction to prevent dislocation   May advance use of the arm actively in ADLs without being restricted to arm by the side of the  body, however, avoid heavy lifting and sports (forever!)  May initiate functional IR behind the back gently  NO UPPER BODY ERGOMETER   GOALS  Optimize PROM for elevation and ER in scapular plane with realistic expectation that max  mobility for elevation is usually around 145-160 passively; ER 40 to 50 passively; functional IR to L1  Recover AROM to approach as close to PROM available as possible; may expect 135-150 deg active elevation; 30 deg active ER; active functional IR to L1  Establish dynamic stability of the shoulder with deltoid and periscapular muscle gradual strengthening  EXERCISES  Forward elevation in scapular plane active progression: supine to incline, to vertical; short to long lever arm  Balanced position long lever arm AROM  Active ER/IR with arm at side  Scapular retraction with light band resistance  Functional IR with hand slide up back - very gentle and gradual  NO UPPER BODY ERGOMETER     CRITERIA TO PROGRESS TO PHASE 4   AROM equals/approaches PROM with good mechanics for elevation    No pain   Higher level demand on shoulder than ADL functions   PHASE 4 12 months and beyond  GENERAL GUIDELINES AND PRECAUTIONS  No heavy lifting and no overhead sports  No heavy pushing activity  Gradually increase strength of deltoid and scapular stabilizers; also the rotator cuff if present with weights not to exceed 5 lbs  NO UPPER BODY ERGOMETER   GOALS   Optimize functional use of the operative UE to meet the desired demands   Gradual increase in deltoid, scapular muscle, and rotator cuff strength   Pain free functional activities   EXERCISES  Add light hand weights for deltoid up to and not to exceed 3 lbs for anterior and posterior with long arm lift against gravity; elbow bent to 90 deg for abduction in scapular plane  Theraband progression for extension to hip with scapular depression/retraction  Theraband progression for serratus  anterior punches in supine; avoid wall, incline or prone pressups for serratus anterior  End range stretching gently without forceful overpressure in all planes (elevation in scapular plane, ER in scapular plane, functional IR) with stretching done for life as part of a daily routine  NO UPPER BODY ERGOMETER     CRITERIA FOR DISCHARGE FROM SKILLED PHYSICAL THERAPY   Pain free AROM for shoulder elevation (expect around 135-150)   Functional strength for all ADLs, work tasks, and hobbies approved by Psychologist, sport and exercise   Independence with home maintenance program   NOTES: 1. With proper exercise, motion, strength, and function continue to improve even after one year. 2. The complication rate after surgery  is 5 - 8%. Complications include infection, fracture, heterotopic bone formation, nerve injury, instability, rotator cuff tear, and tuberosity nonunion. Please look for clinical signs, unusual symptoms, or lack of progress with therapy and report those to Dr. [name]. Prefer more communication than less.  3. The therapy plan above only serves as a guide. Please be aware of specific individualized patient instructions as written on the prescription or through discussions with the surgeon. 4. Please call Dr. [name] if you have any specific questions or concerns [phone]

## 2018-01-08 NOTE — Care Management Note (Signed)
Case Management Note  Patient Details  Name: Adriana Martin MRN: [id number] Date of Birth: 18-Nov-1945  Subjective/Objective:   Admitted to [facility] with the diagnosis of post shoulder replacement. Lives alone. Daughter is Engineer, structural. Prescriptions are filled at [facility] [place], [place] in the home. Takes care of all basic activities of daily living herself. Daughter will transport                 Action/Plan:                                                  Occupation and physical therapy recommending services in the home. [facility]ose [facility]. Will update [facility] when discharge orders are obtained   Expected Discharge Date:  01/08/18               Expected Discharge Plan:     In-House Referral:   yes  Discharge planning Services     Post Acute Care Choice:   yes Choice offered to:   patient  DME Arranged:    DME Agency:     HH Arranged:   yes HH Agency:   [facility]  Status of Service:     If discussed at [employer], dates discussed:    Additional Comments:  Shelbie Ammons, RN MSN CCM Care Management [phone] 01/08/2018, 9:45 AM

## 2018-01-08 NOTE — Discharge Summary (Signed)
Physician Discharge Summary  Patient ID: Adriana Martin MRN: [id number] DOB/AGE: [birth year] 72 y.o.  Admit date: 01/07/2018 Discharge date: 01/08/2018  Admission Diagnoses:  right rotator cuff insufficiency   Discharge Diagnoses: Patient Active Problem List   Diagnosis Date Noted  . 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/2018  . 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 Carcinoma of overlapping sites of right breast in female, estrogen receptor positive ([place]) 06/16/2016  . Acute exacerbation of CHF (congestive heart failure) ([place]) 06/07/2016  . A-fib ([place]) 04/08/2016  . COPD exacerbation ([place]) 03/22/2016  . Acute bronchitis 03/22/2016  . Atrial fibrillation with RVR ([place]) 03/22/2016  . Chronic renal insufficiency 03/22/2016  . Cough with expectoration 06/04/2015  . Thrombocytosis ([place]) 02/01/2015  . Anemia 02/01/2015  . Atrial flutter ([place]) 01/23/2015  . Atrial fibrillation ([place]) 01/23/2015  . Other long term (current) drug therapy 11/06/2014  . Chronic diastolic heart failure ([place]) 08/01/2014  . Long term current use of anticoagulant 06/06/2014  . Anxiety 05/13/2014  . Chronic obstructive pulmonary disease ([place]) 03/22/2014  . 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 Avitaminosis D 02/19/2014    Past Medical History:  Diagnosis Date  . Anemia   . Anxiety   . Atrial fibrillation ([place])   . B12 deficiency   . Breast cancer ([place])    RT Lumpectomy c radiation 2012  . Breast cancer ([place])   . CHF (congestive heart failure) ([place])   . Chronic kidney disease   . Cirrhosis ([place])   . COPD (chronic obstructive pulmonary disease) ([place])   . Depression   . GERD (gastroesophageal reflux disease)   . Headache   . Heart murmur   . Hyperlipemia   . Hyperlipidemia   . Hypertension   . Hypothyroidism   . Iron (Fe) deficiency anemia   . Presence of permanent cardiac pacemaker   . Shortness of breath dyspnea   . Thrombocytopenia ([place])   .  Vitamin D deficiency      Transfusion: No transfusions during this admission   Consultants (if any):   Discharged Condition: Improved  Hospital Course: Adriana Martin is an 72 y.o. female who was admitted 01/07/2018 with a diagnosis of right shoulder rotator cuff arthropathy and went to the operating room on 01/07/2018 and underwent the above named procedures.    Surgeries:Procedure(s): REVERSE SHOULDER ARTHROPLASTY BICEPS TENODESIS on 01/07/2018   PRE-OP DIAGNOSIS:  1. Right shoulder rotator cuff arthropathy  POST-OP DIAGNOSIS: 1. Right shoulder rotator cuff arthropathy  PROCEDURES:  1. Right reverse total shoulder arthroplasty 2. Right biceps tenodesis  SURGEON: Cato Mulligan, MD  ANESTHESIA: Gen   ESTIMATED BLOOD LOSS: 350cc  TOTAL IV FLUIDS: See anesthesia record  IMPLANTS: [employer] Surgical: RSP Glenoid Head w/Retaining screw 32-4; Monoblock Reverse Shoulder Baseplate with 6.3JS central screw; 3 locking screws into baseplate (97WY, 63ZC, 58IF); Small Shell Humeral Stem 12 x155m; Neutral +4 Small Socket Insert.  INDICATION(S): JSarayah Martin a 72y.o. female with chronic shoulder pain with inability to lift arm overhead. Imaging consistent with massive, irreparable rotator cuff tear with significant atrophy. Conservative measures including medications, therapy, and cortisone injections have not provided adequate relief.  She received preoperative clearance from her cardiologist and nephrologist.  After discussion of risks, benefits, and alternatives to surgery, the patient elected to proceed with reverse shoulder arthroplasty and biceps tenodesis.   Patient tolerated the surgery well. No complications .Patient was taken to PACU where she was stabilized and then transferred to the orthopedic floor.  Patient started back on her preadmission Coumadin. Foot pumps applied bilaterally at 80 mm hgb. Heels elevated off bed with rolled towels. No evidence of DVT.  Calves non tender. Negative [place]. Physical therapy started on day #1 for gait training and transfer with OT starting on  day #1 for ADL and assisted devices. Patient has done well with therapy.  Patient was able to ambulate with physical therapy safely.  Patient's IV And Foley were discontinued on day #1 with Hemovac being discontinued on day #1. Dressing was changed prior to patient being discharged   She was given perioperative antibiotics:  Anti-infectives (From admission, onward)   Start     Dose/Rate Route Frequency Ordered Stop   01/07/18 1400  ceFAZolin (ANCEF) IVPB 2g/100 mL premix     2 g 200 mL/hr over 30 Minutes Intravenous Every 6 hours 01/07/18 1304 01/08/18 0253   01/07/18 0833  vancomycin (VANCOCIN) powder  Status:  Discontinued       As needed 01/07/18 5852 01/07/18 1153   01/07/18 0605  ceFAZolin (ANCEF) 2-3 GM-%(50ML) IVPB SOLR    Note to Pharmacy:  Norton Blizzard  : cabinet override      01/07/18 0605 01/07/18 1814   01/06/18 2330  ceFAZolin (ANCEF) IVPB 2g/100 mL premix     2 g 200 mL/hr over 30 Minutes Intravenous  Once 01/06/18 2317 01/07/18 0751    .  She was fitted with AV 1 compression foot pump devices, instructed on heel pumps, early ambulation, and fitted with TED stockings bilaterally for DVT prophylaxis.  She benefited maximally from the hospital stay and there were no complications.    Recent vital signs:  Vitals:   01/07/18 1953 01/08/18 0026  BP: 121/73 124/76  Pulse: 89 85  Resp: 16 18  Temp: 97.6 F (36.4 C) 98.6 F (37 C)  SpO2: 96% 99%    Recent laboratory studies:  Lab Results  Component Value Date   HGB 10.8 (L) 01/08/2018   HGB 12.7 12/28/2017   HGB 14.2 10/13/2017   Lab Results  Component Value Date   WBC 14.7 (H) 01/08/2018   PLT 312 01/08/2018   Lab Results  Component Value Date   INR 1.15 01/07/2018   Lab Results  Component Value Date   NA 134 (L) 01/08/2018   K 4.4 01/08/2018   CL 99 (L) 01/08/2018   CO2 27  01/08/2018   BUN 31 (H) 01/08/2018   CREATININE 1.23 (H) 01/08/2018   GLUCOSE 142 (H) 01/08/2018    Discharge Medications:   Allergies as of 01/08/2018      Reactions   Atorvastatin Shortness Of Breath   Calcium Carbonate    Other reaction(s): Unknown   Fluoxetine    Other reaction(s): Unknown   Hydrochlorothiazide    Other reaction(s): Unknown      Medication List    TAKE these medications   ADVAIR DISKUS 100-50 MCG/DOSE Aepb Generic drug:  Fluticasone-Salmeterol Inhale 1 puff into the lungs 2 (two) times daily.   anastrozole 1 MG tablet Commonly known as:  ARIMIDEX Take 1 tablet (1 mg total) by mouth daily.   aspirin EC 81 MG tablet Take 81 mg by mouth daily.   bisacodyl 5 MG EC tablet Commonly known as:  DULCOLAX Take 5 mg by mouth daily as needed for moderate constipation.   CENTRUM SILVER ULTRA WOMENS Tabs Take 1 tablet by mouth daily.   PRESERVISION AREDS 2 PO Take 2 capsules by mouth daily.   diltiazem 90 MG  tablet Commonly known as:  CARDIZEM Take 45 mg by mouth every 6 (six) hours.   ezetimibe 10 MG tablet Commonly known as:  ZETIA Take 10 mg by mouth daily.   furosemide 40 MG tablet Commonly known as:  LASIX Take 40 mg by mouth daily.   IRON PO Take 65 mg by mouth daily.   levothyroxine 50 MCG tablet Commonly known as:  SYNTHROID, LEVOTHROID Take 50 mcg by mouth daily before breakfast.   lisinopril 5 MG tablet Commonly known as:  PRINIVIL,ZESTRIL Take 5 mg by mouth daily.   Melatonin 3 MG Tabs Take 3 mg by mouth at bedtime as needed (sleep).   oxyCODONE 5 MG immediate release tablet Commonly known as:  Oxy IR/ROXICODONE Take 1-2 tablets (5-10 mg total) by mouth every 3 (three) hours as needed for moderate pain (pain score 4-6).   potassium chloride 10 MEQ tablet Commonly known as:  K-DUR,KLOR-CON Take 10 mEq by mouth 2 (two) times daily.   pravastatin 20 MG tablet Commonly known as:  PRAVACHOL Take 20 mg by mouth at bedtime.    predniSONE 5 MG tablet Commonly known as:  DELTASONE Take 5 mg by mouth daily with breakfast.   albuterol 108 (90 Base) MCG/ACT inhaler Commonly known as:  PROVENTIL HFA;VENTOLIN HFA Inhale into the lungs every 6 (six) hours as needed for wheezing or shortness of breath.   PROAIR HFA 108 (90 Base) MCG/ACT inhaler Generic drug:  albuterol Inhale 2 puffs into the lungs every 4 (four) hours as needed for wheezing or shortness of breath.   PROBIOTIC DAILY PO Take 1 capsule by mouth daily.   sertraline 100 MG tablet Commonly known as:  ZOLOFT Take 100 mg by mouth daily.   ursodiol 300 MG capsule Commonly known as:  ACTIGALL Take 300 mg by mouth 2 (two) times daily.   Vitamin D (Ergocalciferol) 50000 units Caps capsule Commonly known as:  DRISDOL Take 50,000 Units by mouth every 14 (fourteen) days.   warfarin 6 MG tablet Commonly known as:  COUMADIN Take 6 mg by mouth every evening.       Diagnostic Studies: Dg Shoulder Right Port  Result Date: 01/07/2018 CLINICAL DATA:  Status post right shoulder replacement. EXAM: PORTABLE RIGHT SHOULDER COMPARISON:  CT scan of September 22, 2017. FINDINGS: The humeral and glenoid components appear to be well situated. No fracture or dislocation is noted. Expected postoperative changes are noted in the surrounding soft tissues. IMPRESSION: S/p right total shoulder arthroplasty. Electronically Signed   By: Marijo Conception, M.D.   On: 01/07/2018 12:23   Dg Bone Survey Met  Result Date: 12/15/2017 CLINICAL DATA:  Multiple myeloma EXAM: METASTATIC BONE SURVEY COMPARISON:  None. FINDINGS: Left pacer in place with leads in the right ventricle and coronary sinus. Prior valve replacement. Cardiomegaly. Rounded nodular density projects over the right lower lobe, stable since 2017. No acute airspace opacities or effusions. Small lucent lesions noted within the calvarium. Degenerative disc and facet disease throughout the cervical spine. Mild to moderate  compression fracture at T8 is new since 2017. Severe compression fracture with sclerosis at L1. No additional suspicious focal lytic lesion throughout the visualized skeleton. IMPRESSION: Scattered lytic lesions throughout the calvarium concerning for metastases or myeloma. Compression fractures at T8 and L1.  These are new since 2017. Cardiomegaly. Electronically Signed   By: Rolm Baptise M.D.   On: 12/15/2017 09:12    Disposition: Discharge disposition: 01-Home or Self Care       Discharge Instructions  Increase activity slowly   Complete by:  As directed          Signed: Bree Heinzelman R. 01/08/2018, 7:07 AM

## 2018-01-08 NOTE — Evaluation (Signed)
Occupational Therapy Evaluation Patient Details Name: Adriana Martin MRN: [id number] DOB: 1946/05/12 Today's Date: 01/08/2018    History of Present Illness Patient is a 72 year old female admitted following R shoulder replacement with biceps tenodesis.  PMH includes pacemaker implantation, breast CA, thrombocytopenia, Htn, HLD, heart murmur, HA,, GERD, depression, COPD, CHF, CKD, cirrhosis, atrial fibrillation, anxiety and anemia.   Clinical Impression   Patient was evaluated this date by OT. Pt lives in a 1 story home with 3 steps and bilateral rails. Her granddaughter also lives with her. Patient was independent prior to admission including driving and was retired.She has a cane +++ and a RW at home but did not use them. Patient has PRN family to assist at home from her granddaughter and children. Her daughters were in charge of medication mgt prior to admission. Patient has orders for RUE (dominant) to be immobilized and will be non weight bearing per MD. Patient presents with pain, decreased strength, decreased knowledge/retention of precautions, and demonstrates decreased ability to perform self care tasks resulting from these impairments. Pt instructed in polar care, sling/immobilizer, falls prevention, PROM/AROM RUE exercises, and techniques for performing ADL tasks while maintaining shoulder precautions. Pt will need further instructions for managing daily self care tasks with use of one hand only. Patient will benefit from skilled OT care to address these limitations and improve independence in daily tasks to return home with family assist PRN. Patient would like to go home at discharge and would benefit from continued OT [facility] services to maximize carry over and implementation of learned precautions and techniques for self care in order to minimize falls risk, maximize independence and self-efficacy for shoulder mgt, and minimize caregiver burden. Recommend initial HHOT services and 24/7  supervision/assist for mobility and ADL tasks for maximal safety.    Follow Up Recommendations  Home health OT;Supervision/Assistance - 24 hour    Equipment Recommendations  None recommended by OT    Recommendations for Other Services       Precautions / Restrictions Precautions Precautions: Fall Restrictions Weight Bearing Restrictions: Yes RUE Weight Bearing: Non weight bearing      Mobility Bed Mobility Overal bed mobility: Needs Assistance Bed Mobility: Supine to Sit     Supine to sit: Supervision;HOB elevated     General bed mobility comments: getting up to L side and HOB elevated pt able to perform with supervision, no physical assist required  Transfers Overall transfer level: Needs assistance Equipment used: Straight cane Transfers: Sit to/from Stand Sit to Stand: Min guard              Balance Overall balance assessment: History of Falls;Needs assistance Sitting-balance support: No upper extremity supported;Feet supported Sitting balance-Leahy Scale: Good       Standing balance-Leahy Scale: Fair Standing balance comment: fair-, noted to reach outside BOS x1 for arm rest of recliner; denies "furniture walking" at home, improved standing balance with use of SPC in L hand                           ADL either performed or assessed with clinical judgement   ADL Overall ADL's : Needs assistance/impaired Eating/Feeding: Sitting;Modified independent Eating/Feeding Details (indicate cue type and reason): using L non-dominant hand Grooming: Sitting;Supervision/safety;Cueing for UE precautions Grooming Details (indicate cue type and reason): instructed in techniques to maintain precautions for R underarm grooming Upper Body Bathing: Cueing for UE precautions;Minimal assistance Upper Body Bathing Details (indicate cue type and  reason): instructed in techniques to maintain precautions for RUE bathing Lower Body Bathing: Sit to/from stand;Minimal  assistance;Moderate assistance;Cueing for safety   Upper Body Dressing : Cueing for UE precautions;Sitting;Moderate assistance;Minimal assistance Upper Body Dressing Details (indicate cue type and reason): instructed in hemi techniques for UB dressing requiring min-mod assist and mod verbal cues to minimize R shoulder activation Lower Body Dressing: Sit to/from stand;Minimal assistance   Toilet Transfer: Ambulation;Min guard   Toileting- Clothing Manipulation and Hygiene: Sit to/from stand;Modified independent Toileting - Clothing Manipulation Details (indicate cue type and reason): using non-dominant LUE     Functional mobility during ADLs: Min guard;Cane       Vision Baseline Vision/History: Wears glasses Wears Glasses: At all times Patient Visual Report: No change from baseline       Perception     Praxis      Pertinent Vitals/Pain Pain Assessment: 0-10 Pain Score: 2  Pain Location: R shoulder Pain Descriptors / Indicators: Aching Pain Intervention(s): Limited activity within patient's tolerance;Monitored during session;Repositioned;Ice applied     Hand Dominance Right   Extremity/Trunk Assessment Upper Extremity Assessment Upper Extremity Assessment: Overall WFL for tasks assessed;RUE deficits/detail(LUE WFL) RUE Deficits / Details: decreased grip strength, full wrist flexion/extension ROM, full PROM elbow and 0-90 PROM shoulder flexion RUE: Unable to fully assess due to immobilization;Unable to fully assess due to pain RUE Sensation: decreased light touch(4th and 5th digits) RUE Coordination: decreased fine motor;decreased gross motor   Lower Extremity Assessment Lower Extremity Assessment: Overall WFL for tasks assessed   Cervical / Trunk Assessment Cervical / Trunk Assessment: Normal   Communication Communication Communication: No difficulties   Cognition Arousal/Alertness: Awake/alert Behavior During Therapy: WFL for tasks assessed/performed Overall  Cognitive Status: Within Functional Limits for tasks assessed                                 General Comments: requires max verbal cues to maintain shoulder precautions during ADL tasks   General Comments       Exercises Other Exercises Other Exercises: Pt instructed in polar care mgt, sling mgt, wear schedule for both, and safety considerations for use. Handout provided. Other Exercises: Pt instructed in AROM/PROM exercises to perform with caregiver with handout provided. Other Exercises: Pt instructed in hemi techniques for grooming, bathing, dressing, and toileting to maximize safety/independence.    Shoulder Instructions      Home Living Family/patient expects to be discharged to:: Private residence Living Arrangements: Children(gdtr) Available Help at Discharge: Available PRN/intermittently;Family Type of Home: Mobile home Home Access: Stairs to enter Entrance Stairs-Number of Steps: 3 Entrance Stairs-Rails: Can reach both Home Layout: One level     Bathroom Shower/Tub: Walk-in shower;Tub/shower unit   Bathroom Toilet: Standard Bathroom Accessibility: Yes   Home Equipment: Walker - 2 wheels;Cane - single point          Prior Functioning/Environment Level of Independence: Independent        Comments: Independent with all ADL and mobility prior, driving, and enjoys playing the lottery daily. No other falls in past 12 months.        OT Problem List: Decreased strength;Decreased knowledge of use of DME or AE;Decreased range of motion;Decreased knowledge of precautions;Impaired UE functional use;Pain;Impaired sensation;Decreased safety awareness;Impaired balance (sitting and/or standing)      OT Treatment/Interventions: Self-care/ADL training;Balance training;Therapeutic exercise;Therapeutic activities;DME and/or AE instruction;Patient/family education    OT Goals(Current goals can be found in the care plan section)  Acute Rehab OT Goals Patient  Stated Goal: To return home and back to generally active lifestyle as soon as possible. OT Goal Formulation: With patient Time For Goal Achievement: 01/22/18 Potential to Achieve Goals: Good ADL Goals Pt Will Perform Grooming: sitting;with modified independence(seesaw technique for R underarm, assist for LUE) Pt Will Perform Upper Body Bathing: with modified independence;with caregiver independent in assisting;sitting(seesaw technique for R underarm, caregiver for LUE) Pt Will Perform Upper Body Dressing: with min assist;with caregiver independent in assisting;sitting Pt/caregiver will Perform Home Exercise Program: Right Upper extremity;Increased ROM;With written HEP provided(with caregiver indep, shoulder/elbow only PROM)  OT Frequency: Min 1X/week   Barriers to D/C:            Co-evaluation              AM-PAC PT "6 Clicks" Daily Activity     Outcome Measure Help from another person eating meals?: None Help from another person taking care of personal grooming?: A Little Help from another person toileting, which includes using toliet, bedpan, or urinal?: A Little Help from another person bathing (including washing, rinsing, drying)?: A Little Help from another person to put on and taking off regular upper body clothing?: A Lot Help from another person to put on and taking off regular lower body clothing?: A Little 6 Click Score: 18   End of Session    Activity Tolerance: Patient tolerated treatment well Patient left: in chair;with call bell/phone within reach;with chair alarm set;Other (comment)(sling/immobilizer and polar care in place)  OT Visit Diagnosis: Unsteadiness on feet (R26.81);Muscle weakness (generalized) (M62.81);Pain;History of falling (Z91.81) Pain - Right/Left: Right Pain - part of body: Shoulder                Time: 4627-0350 OT Time Calculation (min): 41 min Charges:  OT General Charges $OT Visit: 1 Visit OT Evaluation $OT Eval Moderate Complexity: 1  Mod OT Treatments $Self Care/Home Management : 8-22 mins $Therapeutic Exercise: 8-22 mins  Jeni Salles, MPH, MS, OTR/L ascom [phone] 01/08/18, 9:51 AM

## 2018-01-08 NOTE — Care Management (Signed)
Spoke with Dr. Leim Fabry.  States he would like [name] to continue with outpatient services. Will be attending outpatient therapy next Tuesday. Adriana Martin and RN MSN CCM Care Management [phone]

## 2018-01-08 NOTE — Progress Notes (Addendum)
Subjective: 1 Day Post-Op Procedure(s) (LRB): REVERSE SHOULDER ARTHROPLASTY (Right) BICEPS TENODESIS (Right) Patient reports pain as mild.   Patient is Experiencing some numbness to the fourth and fifth finger of right hand. Did well with physical therapy. Plan is to go Home after hospital stay. no nausea and no vomiting Patient denies any chest pains or shortness of breath. Objective: Vital signs in last 24 hours: Temp:  [97.4 F (36.3 C)-98.6 F (37 C)] 98.6 F (37 C) (04/20 0026) Pulse Rate:  [80-90] 85 (04/20 0026) Resp:  [10-18] 18 (04/20 0026) BP: (96-124)/(59-77) 124/76 (04/20 0026) SpO2:  [95 %-100 %] 99 % (04/20 0026) well approximated incision Heels are non tender and elevated off the bed using rolled towels Intake/Output from previous day: 04/19 0701 - 04/20 0700 In: 1647.5 [P.O.:420; I.V.:1017.5; IV Piggyback:210] Out: 1290 [Urine:1000; Drains:90; Blood:200] Intake/Output this shift: Total I/O In: 53 [P.O.:420] Out: 980 [Urine:900; Drains:80]  Recent Labs    01/08/18 0328  HGB 10.8*   Recent Labs    01/08/18 0328  WBC 14.7*  RBC 3.56*  HCT 31.7*  PLT 312   Recent Labs    01/08/18 0328  NA 134*  K 4.4  CL 99*  CO2 27  BUN 31*  CREATININE 1.23*  GLUCOSE 142*  CALCIUM 8.8*   Recent Labs    01/07/18 0633  INR 1.15    EXAM General - Patient is Alert, Appropriate and Oriented Extremity - Neurovascular intact No cellulitis present Compartment soft She is noted to have decreased sensation to the fourth and fifth finger of right hand to touch.  She also has weakness of the right hand compared to the left.  Has good strength with dorsiflexion and palmar flexion of the wrist.  Weakness appears to be to the intrinsic muscles mostly of the hand. Dressing - scant drainage Motor Function - intact, moving foot and toes well on exam.    Past Medical History:  Diagnosis Date  . Anemia   . Anxiety   . Atrial fibrillation ([place])   . B12  deficiency   . Breast cancer ([place])    RT Lumpectomy c radiation 2012  . Breast cancer ([place])   . CHF (congestive heart failure) ([place])   . Chronic kidney disease   . Cirrhosis ([place])   . COPD (chronic obstructive pulmonary disease) ([place])   . Depression   . GERD (gastroesophageal reflux disease)   . Headache   . Heart murmur   . Hyperlipemia   . Hyperlipidemia   . Hypertension   . Hypothyroidism   . Iron (Fe) deficiency anemia   . Presence of permanent cardiac pacemaker   . Shortness of breath dyspnea   . Thrombocytopenia ([place])   . Vitamin D deficiency     Assessment/Plan: 1 Day Post-Op Procedure(s) (LRB): REVERSE SHOULDER ARTHROPLASTY (Right) BICEPS TENODESIS (Right) Active Problems:   Status post shoulder replacement  Estimated body mass index is 30.18 kg/m as calculated from the following:   Height as of this encounter: 5\' 2"  (1.575 m).   Weight as of this encounter: 74.8 kg (165 lb). Advance diet Up with therapy D/C IV fluids Discharge home with home health  Labs: Were reviewed and acceptable DVT Prophylaxis - Coumadin Weight-Bearing as tolerated  D/C O2 and Pulse OX and try on Room Air Hemovac was discontinued today.  The ends of the drain appeared to be intact. Please change dressing prior to patient being discharged  Jon R. [place] 01/08/2018, 6:58  AM    ADDENDUM: Patient feeling well this AM and sitting in chair. No complaints or concerns.   RUE: +ain/pin/u/ax motor SILT r/m/ax. She notes some mild numbness in ulnar nerve distribution that has been improving since earlier in the morning.   +rad pulse Sling in place.   POD#1 s/p R RSA; doing well.  - Plan for DC home today - Will continue to monitor R ulnar nerve function; expect recovery given improvement in symptoms over the course of the day and ability to fire intrinsics

## 2018-01-08 NOTE — Progress Notes (Signed)
Physical Therapy Treatment Patient Details Name: Adriana Martin MRN: [id number] DOB: [birth year] Today's Date: 01/08/2018    History of Present Illness Patient is a 72 year old female admitted following R shoulder replacement with biceps tenodesis.  PMH includes pacemaker implantation, breast CA, thrombocytopenia, Htn, HLD, heart murmur, HA,, GERD, depression, COPD, CHF, CKD, cirrhosis, atrial fibrillation, anxiety and anemia.    PT Comments    Pt in recliner.  Stood and ambulated initially with cane.  "I'm not using it" and it was left to the side after 40'.  She ambulated around nursing unit and completed stair training with 1 rail.  Min guard for all activities.  She seemed somewhat impulsive and hurried during session and anxious to go home.  No LOB's requiring assist to correct but unsteadiness noted at times.  Educated on safety and need to slow down.  When she fatigued gait speed increased.  Discussed use of cane in community and when walking outside.  Voiced understanding.  Pt voiced no further questions or concerns.   Follow Up Recommendations  Home health PT     Equipment Recommendations  None recommended by PT    Recommendations for Other Services       Precautions / Restrictions Precautions Precautions: Fall Restrictions Weight Bearing Restrictions: Yes RUE Weight Bearing: Non weight bearing    Mobility  Bed Mobility Overal bed mobility: Needs Assistance Bed Mobility: Supine to Sit     Supine to sit: Supervision;HOB elevated     General bed mobility comments: in recliner  Transfers Overall transfer level: Needs assistance Equipment used: None Transfers: Sit to/from Stand Sit to Stand: Min guard            Ambulation/Gait Ambulation/Gait assistance: Min guard Ambulation Distance (Feet): 200 Feet Assistive device: None Gait Pattern/deviations: Step-through pattern;Decreased step length - right;Decreased step length - left;Shuffle Gait velocity:  decreased   General Gait Details: shuffling gait with dec step height and length.  hurries when fatigued.  declines use of SPC   Stairs Stairs: Yes Stairs assistance: Min guard Stair Management: One rail Right Number of Stairs: 4 General stair comments: stated stairs at home are not as steep and steps are wider.     Wheelchair Mobility    Modified Rankin (Stroke Patients Only)       Balance Overall balance assessment: History of Falls;Needs assistance Sitting-balance support: No upper extremity supported;Feet supported Sitting balance-Leahy Scale: Good     Standing balance support: No upper extremity supported Standing balance-Leahy Scale: Fair Standing balance comment: fair-, noted to reach outside BOS x1 for arm rest of recliner; denies "furniture walking" at home, improved standing balance with use of SPC in L hand                            Cognition Arousal/Alertness: Awake/alert Behavior During Therapy: Agitated;WFL for tasks assessed/performed Overall Cognitive Status: Within Functional Limits for tasks assessed                                 General Comments: requires max verbal cues to maintain shoulder precautions during ADL tasks      Exercises Other Exercises Other Exercises: Pt instructed in polar care mgt, sling mgt, wear schedule for both, and safety considerations for use. Handout provided. Other Exercises: Pt instructed in AROM/PROM exercises to perform with caregiver with handout provided. Other Exercises: Pt instructed in  hemi techniques for grooming, bathing, dressing, and toileting to maximize safety/independence.     General Comments        Pertinent Vitals/Pain Pain Assessment: 0-10 Pain Score: 3  Pain Location: R shoulder Pain Descriptors / Indicators: Aching Pain Intervention(s): Limited activity within patient's tolerance;Monitored during session;Ice applied    Home Living Family/patient expects to be  discharged to:: Private residence Living Arrangements: Children(gdtr) Available Help at Discharge: Available PRN/intermittently;Family Type of Home: Mobile home Home Access: Stairs to enter Entrance Stairs-Rails: Can reach both Home Layout: One level Home Equipment: Walker - 2 wheels;Cane - single point      Prior Function Level of Independence: Independent      Comments: Independent with all ADL and mobility prior, driving, and enjoys playing the lottery daily. No other falls in past 12 months.   PT Goals (current goals can now be found in the care plan section) Acute Rehab PT Goals Patient Stated Goal: To return home and back to generally active lifestyle as soon as possible. Progress towards PT goals: Progressing toward goals    Frequency    BID      PT Plan Current plan remains appropriate    Co-evaluation              AM-PAC PT "6 Clicks" Daily Activity  Outcome Measure  Difficulty turning over in bed (including adjusting bedclothes, sheets and blankets)?: A Little Difficulty moving from lying on back to sitting on the side of the bed? : A Little Difficulty sitting down on and standing up from a chair with arms (e.g., wheelchair, bedside commode, etc,.)?: A Little Help needed moving to and from a bed to chair (including a wheelchair)?: A Little Help needed walking in hospital room?: A Little Help needed climbing 3-5 steps with a railing? : A Little 6 Click Score: 18    End of Session Equipment Utilized During Treatment: Gait belt Activity Tolerance: Patient tolerated treatment well;Patient limited by fatigue Patient left: in chair;with chair alarm set;with call bell/phone within reach Nurse Communication: Other (comment) Pain - Right/Left: Right Pain - part of body: Shoulder     Time: 1700-1749 PT Time Calculation (min) (ACUTE ONLY): 11 min  Charges:  $Gait Training: 8-22 mins                    G Codes:       Chesley Noon, PTA 01/08/18, 10:38  AM

## 2018-01-11 ENCOUNTER — Telehealth: Payer: Self-pay

## 2018-01-11 LAB — SURGICAL PATHOLOGY

## 2018-01-11 NOTE — Telephone Encounter (Signed)
EMMI Follow-up: Contacted patient as there was a question who to contact about change in condition.  Talked with Adriana Martin and she is still having pain in arm but has PT on Wednesday. Advised her to follow-up with PCP if condition changes.  Let her know to there would be another automated call and to respond to prompts if she has any other concerns.

## 2018-02-22 ENCOUNTER — Encounter: Payer: Self-pay | Admitting: Orthopedic Surgery

## 2018-03-15 ENCOUNTER — Other Ambulatory Visit: Payer: Medicare Other

## 2018-03-15 ENCOUNTER — Ambulatory Visit: Payer: Medicare Other | Admitting: Internal Medicine

## 2018-06-07 ENCOUNTER — Inpatient Hospital Stay: Payer: Medicare Other | Attending: Internal Medicine

## 2018-06-07 DIAGNOSIS — D472 Monoclonal gammopathy: Secondary | ICD-10-CM | POA: Diagnosis not present

## 2018-06-07 DIAGNOSIS — Z853 Personal history of malignant neoplasm of breast: Secondary | ICD-10-CM | POA: Diagnosis not present

## 2018-06-07 DIAGNOSIS — Z79899 Other long term (current) drug therapy: Secondary | ICD-10-CM | POA: Diagnosis not present

## 2018-06-07 DIAGNOSIS — Z87891 Personal history of nicotine dependence: Secondary | ICD-10-CM | POA: Diagnosis not present

## 2018-06-07 DIAGNOSIS — Z170 Estrogen receptor positive status [ER+]: Secondary | ICD-10-CM | POA: Diagnosis not present

## 2018-06-07 LAB — COMPREHENSIVE METABOLIC PANEL
ALT: 18 U/L (ref 0–44)
ANION GAP: 18 — AB (ref 5–15)
AST: 35 U/L (ref 15–41)
Albumin: 4.4 g/dL (ref 3.5–5.0)
Alkaline Phosphatase: 96 U/L (ref 38–126)
BILIRUBIN TOTAL: 0.7 mg/dL (ref 0.3–1.2)
BUN: 23 mg/dL (ref 8–23)
CO2: 24 mmol/L (ref 22–32)
Calcium: 9.5 mg/dL (ref 8.9–10.3)
Chloride: 87 mmol/L — ABNORMAL LOW (ref 98–111)
Creatinine, Ser: 1.06 mg/dL — ABNORMAL HIGH (ref 0.44–1.00)
GFR calc Af Amer: 59 mL/min — ABNORMAL LOW (ref >60)
GFR, EST NON AFRICAN AMERICAN: 51 mL/min — AB (ref >60)
Glucose, Bld: 115 mg/dL — ABNORMAL HIGH (ref 70–99)
POTASSIUM: 3.7 mmol/L (ref 3.5–5.1)
Sodium: 129 mmol/L — ABNORMAL LOW (ref 135–145)
TOTAL PROTEIN: 8.3 g/dL — AB (ref 6.5–8.1)

## 2018-06-07 LAB — CBC WITH DIFFERENTIAL/PLATELET
BASOS ABS: 0.1 10*3/uL (ref 0–0.1)
Basophils Relative: 1 %
EOS PCT: 0 %
Eosinophils Absolute: 0.1 10*3/uL (ref 0–0.7)
HEMATOCRIT: 36.4 % (ref 35.0–47.0)
Hemoglobin: 12.4 g/dL (ref 12.0–16.0)
LYMPHS PCT: 5 %
Lymphs Abs: 0.7 10*3/uL — ABNORMAL LOW (ref 1.0–3.6)
MCH: 30.3 pg (ref 26.0–34.0)
MCHC: 34.1 g/dL (ref 32.0–36.0)
MCV: 88.8 fL (ref 80.0–100.0)
Monocytes Absolute: 0.6 10*3/uL (ref 0.2–0.9)
Monocytes Relative: 4 %
NEUTROS ABS: 13 10*3/uL — AB (ref 1.4–6.5)
Neutrophils Relative %: 90 %
PLATELETS: 414 10*3/uL (ref 150–440)
RBC: 4.1 MIL/uL (ref 3.80–5.20)
RDW: 15.1 % — ABNORMAL HIGH (ref 11.5–14.5)
WBC: 14.4 10*3/uL — AB (ref 3.6–11.0)

## 2018-06-08 LAB — KAPPA/LAMBDA LIGHT CHAINS
KAPPA FREE LGHT CHN: 43.2 mg/L — AB (ref 3.3–19.4)
Kappa, lambda light chain ratio: 1.49 (ref 0.26–1.65)
Lambda free light chains: 29 mg/L — ABNORMAL HIGH (ref 5.7–26.3)

## 2018-06-09 LAB — MULTIPLE MYELOMA PANEL, SERUM
ALBUMIN SERPL ELPH-MCNC: 4.1 g/dL (ref 2.9–4.4)
Albumin/Glob SerPl: 1.3 (ref 0.7–1.7)
Alpha 1: 0.4 g/dL (ref 0.0–0.4)
Alpha2 Glob SerPl Elph-Mcnc: 1 g/dL (ref 0.4–1.0)
B-Globulin SerPl Elph-Mcnc: 1.1 g/dL (ref 0.7–1.3)
GAMMA GLOB SERPL ELPH-MCNC: 0.7 g/dL (ref 0.4–1.8)
Globulin, Total: 3.3 g/dL (ref 2.2–3.9)
IGA: 309 mg/dL (ref 64–422)
IgG (Immunoglobin G), Serum: 784 mg/dL (ref 700–1600)
IgM (Immunoglobulin M), Srm: 71 mg/dL (ref 26–217)
M Protein SerPl Elph-Mcnc: 0.2 g/dL — ABNORMAL HIGH
TOTAL PROTEIN ELP: 7.4 g/dL (ref 6.0–8.5)

## 2018-06-14 ENCOUNTER — Other Ambulatory Visit: Payer: Self-pay | Admitting: Internal Medicine

## 2018-06-14 ENCOUNTER — Inpatient Hospital Stay (HOSPITAL_BASED_OUTPATIENT_CLINIC_OR_DEPARTMENT_OTHER): Payer: Medicare Other | Admitting: Internal Medicine

## 2018-06-14 ENCOUNTER — Encounter: Payer: Self-pay | Admitting: Internal Medicine

## 2018-06-14 VITALS — BP 119/74 | HR 102 | Temp 97.2°F | Resp 16 | Wt 176.0 lb

## 2018-06-14 DIAGNOSIS — Z87891 Personal history of nicotine dependence: Secondary | ICD-10-CM

## 2018-06-14 DIAGNOSIS — Z79899 Other long term (current) drug therapy: Secondary | ICD-10-CM

## 2018-06-14 DIAGNOSIS — D472 Monoclonal gammopathy: Secondary | ICD-10-CM

## 2018-06-14 DIAGNOSIS — Z170 Estrogen receptor positive status [ER+]: Secondary | ICD-10-CM

## 2018-06-14 DIAGNOSIS — Z853 Personal history of malignant neoplasm of breast: Secondary | ICD-10-CM | POA: Diagnosis not present

## 2018-06-14 DIAGNOSIS — Z1231 Encounter for screening mammogram for malignant neoplasm of breast: Secondary | ICD-10-CM

## 2018-06-14 NOTE — Progress Notes (Signed)
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  ---------------------------------------------------   DIAGNOSIS: BREAST CANCER/ JAK-2 POS ET/PV/MGUS  STAGE:  I       ;GOALS: cure  CURRENT/MOST RECENT THERAPY: surveillaince      Carcinoma of overlapping sites of right breast in female, estrogen receptor positive (Adriana Martin)     INTERVAL HISTORY:  A very pleasant 72 year old female patient with above history of MPN Jak 2+ not on Hydrea; chronic kidney disease/MGUS is here for follow-up.  Patient had her shoulder surgery on the right side; postoperatively recovered well.   Review of Systems  Constitutional: Positive for malaise/fatigue. Negative for chills, diaphoresis, fever and weight loss.  HENT: Negative for nosebleeds and sore throat.   Eyes: Negative for double vision.  Respiratory: Negative for cough, hemoptysis, sputum production, shortness of breath and wheezing.   Cardiovascular: Negative for chest pain, palpitations, orthopnea and leg swelling.  Gastrointestinal: Negative for abdominal pain, blood in stool, constipation, diarrhea, heartburn, melena, nausea and vomiting.  Genitourinary: Negative for dysuria, frequency and urgency.  Musculoskeletal: Positive for back pain and joint pain.  Skin: Negative.  Negative for itching and rash.   Neurological: Negative for dizziness, tingling, focal weakness, weakness and headaches.  Endo/Heme/Allergies: Bruises/bleeds easily.  Psychiatric/Behavioral: Negative for depression. The patient is not nervous/anxious and does not have insomnia.       PAST MEDICAL HISTORY :  Past Medical History:  Diagnosis Date  . Anemia   . Anxiety   . Atrial fibrillation (Adriana Martin)   . B12 deficiency   . Breast cancer ([place])    RT Lumpectomy c radiation 2012  . Breast cancer ([place])   . CHF (congestive heart failure) (Adriana Martin)   . Chronic kidney disease   . Cirrhosis (Adriana Martin)   . COPD (chronic obstructive pulmonary disease) (Adriana Martin)   . Depression   . GERD (gastroesophageal reflux disease)   . Headache   . Heart murmur   . Hyperlipemia   . Hyperlipidemia   . Hypertension   . Hypothyroidism   . Iron (Fe) deficiency anemia   . Presence of permanent cardiac pacemaker   . Shortness of breath dyspnea   . Thrombocytopenia (Adriana Martin)   . Vitamin D deficiency     PAST SURGICAL HISTORY :   Past Surgical History:  Procedure Laterality Date  . ABDOMINAL HYSTERECTOMY     Partial  . ABLATION    . APPENDECTOMY    . BICEPT TENODESIS Right 01/07/2018   Procedure: BICEPS TENODESIS;  Surgeon: Adriana Fabry, MD;  Location: [facility] ORS;  Service: Orthopedics;  Laterality: Right;  . BREAST BIOPSY Right 2012  . BREAST LUMPECTOMY Right 2012  . BREAST SURGERY    . CARDIAC SURGERY    . CARDIAC VALVE REPLACEMENT    . COLONOSCOPY  2012  . ELECTROPHYSIOLOGIC STUDY N/A 01/23/2015   Procedure: CARDIOVERSION;  Surgeon: Adriana Skains,  MD;  Location: [facility] ORS;  Service: Cardiovascular;  Laterality: N/A;  . ELECTROPHYSIOLOGIC STUDY N/A 05/27/2016   Procedure: CARDIOVERSION;  Surgeon: Adriana Skains, MD;  Location: [facility] ORS;  Service: Cardiovascular;  Laterality: N/A;  . ESOPHAGOGASTRODUODENOSCOPY  2012  . EYE SURGERY    . MITRAL VALVE REPLACEMENT    . mvp    . REVERSE SHOULDER ARTHROPLASTY Right 01/07/2018   Procedure: REVERSE  SHOULDER ARTHROPLASTY;  Surgeon: Adriana Fabry, MD;  Location: [facility] ORS;  Service: Orthopedics;  Laterality: Right;  Marland Kitchen VSD REPAIR      FAMILY HISTORY :   Family History  Problem Relation Age of Onset  . Cancer Mother   . Breast cancer Mother   . Cancer Father   . Cancer Brother   . Cancer Daughter   . Breast cancer Daughter     SOCIAL HISTORY:   Social History   Tobacco Use  . Smoking status: Former Smoker    Packs/day: 0.25    Years: 30.00    Pack years: 7.50    Types: Cigarettes    Last attempt to quit: 10/07/1998    Years since quitting: 19.7  . Smokeless tobacco: Never Used  Substance Use Topics  . Alcohol use: No    Alcohol/week: 4.0 standard drinks    Types: 4 Cans of beer per week    Frequency: Never    Comment: socially  . Drug use: No    ALLERGIES:  is allergic to atorvastatin; calcium carbonate; fluoxetine; and hydrochlorothiazide.  MEDICATIONS:  Current Outpatient Medications  Medication Sig Dispense Refill  . ADVAIR DISKUS 100-50 MCG/DOSE AEPB Inhale 1 puff into the lungs 2 (two) times daily.   0  . albuterol (PROVENTIL HFA;VENTOLIN HFA) 108 (90 Base) MCG/ACT inhaler Inhale into the lungs every 6 (six) hours as needed for wheezing or shortness of breath.    . anastrozole (ARIMIDEX) 1 MG tablet Take 1 tablet (1 mg total) by mouth daily. 30 tablet 6  . aspirin EC 81 MG tablet Take 81 mg by mouth daily.    . bisacodyl (DULCOLAX) 5 MG EC tablet Take 5 mg by mouth daily as needed for moderate constipation.    Marland Kitchen diltiazem (CARDIZEM) 90 MG tablet Take 45 mg by mouth every 6 (six) hours.    Marland Kitchen ezetimibe (ZETIA) 10 MG tablet Take 10 mg by mouth daily.    . furosemide (LASIX) 40 MG tablet Take 40 mg by mouth daily.   1  . IRON PO Take 65 mg by mouth daily.    Marland Kitchen levothyroxine (SYNTHROID, LEVOTHROID) 50 MCG tablet Take 50 mcg by mouth daily before breakfast.    . lisinopril (PRINIVIL,ZESTRIL) 5 MG tablet Take 5 mg by mouth daily.   98  . Melatonin 3 MG TABS Take 3 mg  by mouth at bedtime as needed (sleep).    . Multiple Vitamins-Minerals (CENTRUM SILVER ULTRA WOMENS) TABS Take 1 tablet by mouth daily.    . Multiple Vitamins-Minerals (PRESERVISION AREDS 2 PO) Take 2 capsules by mouth daily.    Marland Kitchen oxyCODONE (OXY IR/ROXICODONE) 5 MG immediate release tablet Take 1-2 tablets (5-10 mg total) by mouth every 3 (three) hours as needed for moderate pain (pain score 4-6). 30 tablet 0  . potassium chloride (K-DUR,KLOR-CON) 10 MEQ tablet Take 10 mEq by mouth 2 (two) times daily.    . pravastatin (PRAVACHOL) 20 MG tablet Take 20 mg by mouth at bedtime.    . predniSONE (DELTASONE) 5 MG tablet Take 5 mg by  mouth daily with breakfast.    . PROAIR HFA 108 (90 BASE) MCG/ACT inhaler Inhale 2 puffs into the lungs every 4 (four) hours as needed for wheezing or shortness of breath.   0  . Probiotic Product (PROBIOTIC DAILY PO) Take 1 capsule by mouth daily.    . sertraline (ZOLOFT) 100 MG tablet Take 100 mg by mouth daily.    . ursodiol (ACTIGALL) 300 MG capsule Take 300 mg by mouth 2 (two) times daily.     . Vitamin D, Ergocalciferol, (DRISDOL) 50000 units CAPS capsule Take 50,000 Units by mouth every 14 (fourteen) days.     Marland Kitchen warfarin (COUMADIN) 6 MG tablet Take 6 mg by mouth every evening.      No current facility-administered medications for this visit.     PHYSICAL EXAMINATION: ECOG PERFORMANCE STATUS: 1 - Symptomatic but completely ambulatory  BP 119/74 (BP Location: Left Arm, Patient Position: Sitting)   Pulse (!) 102   Temp (!) 97.2 F (36.2 C) (Tympanic)   Resp 16   Wt 176 lb (79.8 kg)   BMI 32.19 kg/m   Filed Weights   06/14/18 1005  Weight: 176 lb (79.8 kg)    Physical Exam  Constitutional: Adriana Martin is oriented to person, place, and time and well-developed, well-nourished, and in no distress.  HENT:  Head: Normocephalic and atraumatic.  Mouth/Throat: Oropharynx is clear and moist. No oropharyngeal exudate.  Eyes: Pupils are equal, round, and reactive to  light.  Neck: Normal range of motion. Neck supple.  Cardiovascular: Normal rate and regular rhythm.  Pulmonary/Chest: No respiratory distress. Adriana Martin has no wheezes.  Abdominal: Soft. Bowel sounds are normal. Adriana Martin exhibits no distension and no mass. There is no tenderness. There is no rebound and no guarding.  Musculoskeletal: Normal range of motion. Adriana Martin exhibits no edema or tenderness.  Neurological: Adriana Martin is alert and oriented to person, place, and time.  Skin: Skin is warm.  Multiple chronic bruising noted.  Psychiatric: Affect normal.     LABORATORY DATA:  I have reviewed the data as listed    Component Value Date/Time   NA 129 (L) 06/07/2018 1020   NA 129 (L) 12/30/2014 0921   K 3.7 06/07/2018 1020   K 4.7 12/30/2014 0921   CL 87 (L) 06/07/2018 1020   CL 91 (L) 12/30/2014 0921   CO2 24 06/07/2018 1020   CO2 26 12/30/2014 0921   GLUCOSE 115 (H) 06/07/2018 1020   GLUCOSE 103 (H) 12/30/2014 0921   BUN 23 06/07/2018 1020   BUN 22 (H) 12/30/2014 0921   CREATININE 1.06 (H) 06/07/2018 1020   CREATININE 1.22 (H) 12/30/2014 0921   CALCIUM 9.5 06/07/2018 1020   CALCIUM 9.1 12/30/2014 0921   PROT 8.3 (H) 06/07/2018 1020   PROT 7.9 12/30/2014 0921   ALBUMIN 4.4 06/07/2018 1020   ALBUMIN 4.0 12/30/2014 0921   AST 35 06/07/2018 1020   AST 31 12/30/2014 0921   ALT 18 06/07/2018 1020   ALT 29 12/30/2014 0921   ALKPHOS 96 06/07/2018 1020   ALKPHOS 101 12/30/2014 0921   BILITOT 0.7 06/07/2018 1020   BILITOT 0.5 12/30/2014 0921   GFRNONAA 51 (L) 06/07/2018 1020   GFRNONAA 45 (L) 12/30/2014 0921   GFRAA 59 (L) 06/07/2018 1020   GFRAA 52 (L) 12/30/2014 0921    No results found for: SPEP, UPEP  Lab Results  Component Value Date   WBC 14.4 (H) 06/07/2018   NEUTROABS 13.0 (H) 06/07/2018   HGB 12.4 06/07/2018  HCT 36.4 06/07/2018   MCV 88.8 06/07/2018   PLT 414 06/07/2018      Chemistry      Component Value Date/Time   NA 129 (L) 06/07/2018 1020   NA 129 (L) 12/30/2014 0921    K 3.7 06/07/2018 1020   K 4.7 12/30/2014 0921   CL 87 (L) 06/07/2018 1020   CL 91 (L) 12/30/2014 0921   CO2 24 06/07/2018 1020   CO2 26 12/30/2014 0921   BUN 23 06/07/2018 1020   BUN 22 (H) 12/30/2014 0921   CREATININE 1.06 (H) 06/07/2018 1020   CREATININE 1.22 (H) 12/30/2014 0921      Component Value Date/Time   CALCIUM 9.5 06/07/2018 1020   CALCIUM 9.1 12/30/2014 0921   ALKPHOS 96 06/07/2018 1020   ALKPHOS 101 12/30/2014 0921   AST 35 06/07/2018 1020   AST 31 12/30/2014 0921   ALT 18 06/07/2018 1020   ALT 29 12/30/2014 0921   BILITOT 0.7 06/07/2018 1020   BILITOT 0.5 12/30/2014 0921       RADIOGRAPHIC STUDIES: I have personally reviewed the radiological images as listed and agreed with the findings in the report. No results found.   ASSESSMENT & PLAN:  MGUS (monoclonal gammopathy of unknown significance) # MGUS-M protein 0.2 grams per deciliter.  No concerns for myeloma.  Stable.  Continue monitoring.  # JAK 2 positive ET/ PV MPN- off hydrea stable; on coumaidn.   # RIGHT BREAST CA stage I  [2012] ER/PR positive HER-2/neu negative- Clinically no evidence of recurrence.  Stable.  # ANEMIA from iron deficiency/CKD- continue PO iron; stable.  # Afib/RVR- s/p recent ppm- on coumadin/ card  # Hyponatremia- sodium 129; sec to lasix/ defer to cards/nephorlogy.   # follow up in 12 months with labs.   CC; Dr.Thies/ Dr. Gilford Rile.     Cammie Sickle, MD 06/15/2018 5:37 PM

## 2018-06-14 NOTE — Assessment & Plan Note (Addendum)
#  MGUS-M protein 0.2 grams per deciliter.  No concerns for myeloma.  Stable.  Continue monitoring.  # JAK 2 positive ET/ PV MPN- off hydrea stable; on coumaidn.   # RIGHT BREAST CA stage I  [2012] ER/PR positive HER-2/neu negative- Clinically no evidence of recurrence.  Stable.  # ANEMIA from iron deficiency/CKD- continue PO iron; stable.  # Afib/RVR- s/p recent ppm- on coumadin/ card  # Hyponatremia- sodium 129; sec to lasix/ defer to cards/nephorlogy.   # follow up in 12 months with labs.   CC; Dr.Thies/ Dr. Gilford Rile.

## 2018-06-14 NOTE — Patient Instructions (Signed)
STOP anastrazole [breast cancer pill] if you are taking it.

## 2018-06-22 ENCOUNTER — Ambulatory Visit
Admission: RE | Admit: 2018-06-22 | Discharge: 2018-06-22 | Disposition: A | Discharge status: Home / Self Care | Payer: Medicare Other | Source: Ambulatory Visit | Attending: Internal Medicine | Admitting: Internal Medicine

## 2018-06-22 DIAGNOSIS — Z1231 Encounter for screening mammogram for malignant neoplasm of breast: Secondary | ICD-10-CM | POA: Diagnosis present

## 2018-06-22 HISTORY — DX: Personal history of irradiation: Z92.3

## 2018-07-13 ENCOUNTER — Other Ambulatory Visit: Payer: Self-pay | Admitting: Gastroenterology

## 2018-07-13 DIAGNOSIS — K743 Primary biliary cirrhosis: Secondary | ICD-10-CM

## 2018-07-19 ENCOUNTER — Ambulatory Visit: Payer: Medicare Other

## 2018-10-20 ENCOUNTER — Ambulatory Visit: Payer: Medicare Other | Admitting: Family Medicine

## 2018-10-24 ENCOUNTER — Encounter: Payer: Self-pay | Admitting: Physical Therapy

## 2018-10-24 ENCOUNTER — Ambulatory Visit: Payer: Medicare Other | Attending: Internal Medicine | Admitting: Physical Therapy

## 2018-10-24 ENCOUNTER — Other Ambulatory Visit: Payer: Self-pay

## 2018-10-24 VITALS — BP 102/65 | HR 98

## 2018-10-24 DIAGNOSIS — R293 Abnormal posture: Secondary | ICD-10-CM | POA: Insufficient documentation

## 2018-10-24 DIAGNOSIS — M6281 Muscle weakness (generalized): Secondary | ICD-10-CM | POA: Insufficient documentation

## 2018-10-24 DIAGNOSIS — Z9181 History of falling: Secondary | ICD-10-CM | POA: Diagnosis present

## 2018-10-24 DIAGNOSIS — R262 Difficulty in walking, not elsewhere classified: Secondary | ICD-10-CM | POA: Diagnosis not present

## 2018-10-24 NOTE — Therapy (Signed)
[place] [facility] [address] [place], [place], [address] Phone: [phone]   Fax:  [phone]  Physical Therapy Evaluation  Patient Details  Name: Adriana Martin MRN: [id number] Date of Birth: Oct 13, 1945 Referring Provider (PT): Mancel Bale MD   Encounter Date: 10/24/2018  PT End of Session - 10/24/18 0754    Visit Number  1    Number of Visits  17    Date for PT Re-Evaluation  12/19/18    Authorization Type  1/10 (eval 10/24/2018)    PT Start Time  0815    PT Stop Time  0910    PT Time Calculation (min)  55 min    Equipment Utilized During Treatment  Gait belt    Activity Tolerance  Patient tolerated treatment well;Patient limited by fatigue    Behavior During Therapy  [facility] for tasks assessed/performed       Past Medical History:  Diagnosis Date  . Anemia   . Anxiety   . Atrial fibrillation ([place])   . B12 deficiency   . Breast cancer ([place])    RT Lumpectomy c radiation 2012  . Breast cancer ([place])   . CHF (congestive heart failure) ([place])   . Chronic kidney disease   . Cirrhosis ([place])   . COPD (chronic obstructive pulmonary disease) ([place])   . Depression   . GERD (gastroesophageal reflux disease)   . Headache   . Heart murmur   . Hyperlipemia   . Hyperlipidemia   . Hypertension   . Hypothyroidism   . Iron (Fe) deficiency anemia   . Personal history of radiation therapy   . Presence of permanent cardiac pacemaker   . Shortness of breath dyspnea   . Thrombocytopenia ([place])   . Vitamin D deficiency     Past Surgical History:  Procedure Laterality Date  . ABDOMINAL HYSTERECTOMY     Partial  . ABLATION    . APPENDECTOMY    . BICEPT TENODESIS Right 01/07/2018   Procedure: BICEPS TENODESIS;  Surgeon: Leim Fabry, MD;  Location: [facility] ORS;  Service: Orthopedics;  Laterality: Right;  . BREAST BIOPSY Right 2012  . BREAST LUMPECTOMY Right 2012   positive/rad  . BREAST SURGERY    . CARDIAC SURGERY    . CARDIAC  VALVE REPLACEMENT    . COLONOSCOPY  2012  . ELECTROPHYSIOLOGIC STUDY N/A 01/23/2015   Procedure: CARDIOVERSION;  Surgeon: Corey Skains, MD;  Location: [facility] ORS;  Service: Cardiovascular;  Laterality: N/A;  . ELECTROPHYSIOLOGIC STUDY N/A 05/27/2016   Procedure: CARDIOVERSION;  Surgeon: Corey Skains, MD;  Location: [facility] ORS;  Service: Cardiovascular;  Laterality: N/A;  . ESOPHAGOGASTRODUODENOSCOPY  2012  . EYE SURGERY    . MITRAL VALVE REPLACEMENT    . mvp    . REVERSE SHOULDER ARTHROPLASTY Right 01/07/2018   Procedure: REVERSE SHOULDER ARTHROPLASTY;  Surgeon: Leim Fabry, MD;  Location: [facility] ORS;  Service: Orthopedics;  Laterality: Right;  Marland Kitchen VSD REPAIR      Vitals:   10/24/18 0819  BP: 102/65  Pulse: 98  SpO2: 99%   Subjective Assessment - 10/24/18 1746    Subjective  Chief complaint: Patient reports that she just gets off balance sometimes. Patient does fall on occasion. Last fall was 3 weeks ago. Patient states that she feels unsteady when she stands up too quickly, with turns. Patient is largely homebound and babysits her daughter's chihuahua.     Patient is accompained by:  Family member  Pertinent History  PMH: a-fib, a-flutter, HTN, hyperlipidemia, anemia, anxiety, hx of falling, CHF, COPD, chronic kidney disease. Patient is largely homebound and living independently at this time; family member present intimated that patient has good social support and requires assistance to maintain independence.    Limitations  Lifting;Standing;Walking;House hold activities      Onset: The problem has "been going on for years." Imaging: None Recent changes in overall health/medication: Yes Directional pattern for falls: backwards  Prior history of physical therapy for balance: None Follow-up appointment with MD: None scheduled Red flags (bowel/bladder changes, saddle paresthesia, personal history of cancer, chills/fever, night sweats, unrelenting pain) + hx of  cancer  OBJECTIVE  MUSCULOSKELETAL: Tremor: Absent Bulk: Normal Tone: Normal, no clonus  Posture Slumped, hip IR and plantarflexed feet.   Gait HHA, BLE IR, L >R. Patient has shuffling gait with shortened strides and limited foot clearance. She does maintain erect posture when provided with HHA. Patient is resistant to the idea of cane use; but was educated on the benefits of cane use relative to furniture walking when at home.   Strength R/L 3/3 Hip flexion 4/4 Hip abduction (seated) 4/4 Hip adduction (seated) 4/4 Knee extension 3+/3+ Knee flexion 4/4 Ankle Plantarflexion (seated) 4/4 Ankle Dorsiflexion   NEUROLOGICAL:  Mental Status Patient is oriented to person, place and time.  Recent memory is intact.* Remote memory is intact.* Attention span and concentration are intact.  Expressive speech is intact.  Patient's fund of knowledge is within normal limits for educational level.  *Patient has reported difficulties with both short term and long term memory. Family member (niece), Adriana Martin, also reports that she has difficulty with memory.  Cranial Nerves Visual acuity and visual fields are intact  Extraocular muscles are intact  Facial sensation is intact bilaterally  Facial strength is intact bilaterally  Hearing is normal as tested by gross conversation Palate elevates midline, normal phonation  Shoulder shrug strength is intact  Tongue protrudes midline; slight deviation to the L.    Sensation Grossly intact to light touch bilateral UEs/LEs as determined by testing dermatomes L2-S2 respectively Proprioception and hot/cold testing deferred on this date  Reflexes R/L 2+/2+ Knee Jerk (L3/4) 2+/2+ Ankle Jerk (S1/2)  Coordination/Cerebellar Finger to Nose: WFL, R side has less accuracy Heel to Shin: WNL Rapid alternating movements: slight difficulty Pronator Drift: Negative  FUNCTIONAL OUTCOME MEASURES   Results Comments  BERG 22/56 Fall risk, in need of  intervention  DGI 8/24 Severe falls risk  TUG seconds Unable to assess 2/2 to fatigue  5TSTS 22.6 seconds BUE support, fall risk   Objective measurements completed on examination: See above findings.     Patient educated on current level of function/falls risk, the benefits of using an AD, prognosis, and POC. Patient and niece articulated understanding.     PT Short Term Goals - 10/24/18 0756      PT SHORT TERM GOAL #1   Title  Pt will be independent with HEP in order to improve strength and balance in order to decrease fall risk and improve function at home and work.     Baseline  IE: not initiated    Time  4    Period  Weeks    Status  New    Target Date  11/21/18        PT Long Term Goals - 10/24/18 0757      PT LONG TERM GOAL #1   Title  Pt will improve BERG by at least 6  points in order to demonstrate clinically significant improvement in balance for decreased risk of falls at home and in the community.    Baseline  IE: 22/56    Time  8    Period  Weeks    Status  New    Target Date  12/19/18      PT LONG TERM GOAL #2   Title  Pt will improve DGI by at least 3 points in order to demonstrate clinically significant improvement in balance and decreased risk for falls.    Baseline  IE: 8/24    Time  8    Period  Weeks    Status  New    Target Date  12/19/18      PT LONG TERM GOAL #3   Title  Pt will decrease 5TSTS performed from standard height chair without use of UE support by at least 3 seconds in order to demonstrate clinically significant improvement in LE strength.    Baseline  IE: 22.6 sec, BUE support    Time  8    Period  Weeks    Status  New    Target Date  12/19/18      PT LONG TERM GOAL #4   Title  Pt will decrease TUG to below 14 seconds/decrease in order to demonstrate decreased fall risk for improved safety in the community.    Baseline  IE: deferred 2/2 to fatigue    Time  8    Period  Weeks    Status  New    Target Date  12/19/18      PT  LONG TERM GOAL #5   Title  Pt will demonstrate increased function as evidenced by a FOTO score greater than or equal to 46 for improved QOL.    Baseline  IE: 32/100    Time  8    Period  Weeks    Status  New    Target Date  12/19/18             Plan - 10/24/18 0755    Clinical Impression Statement  Patient is a pleasant 73 year old female presenting to clinic with chief complaint of impaired balance and unsteadiness during gait and transitional movements. . Patient demonstrates deficits in balance and strength as evidenced by Merrilee Jansky Balance Assessment 22/56, Dynamic Gait Index 8/24, 5 Time Sit-to-Stand of 22.6 sec with BUE support. Patient able to ambulate independently during today's evaluation with shuffling gait, increased BLE IR, limited foot clearance, and decreased stride length.  Patient will benefit from skilled therapeutic intervention to address deficits in strength, balance, mobility, safety, and function in order to decrease risk of falls, increase function, and improve overall QOL.    History and Personal Factors relevant to plan of care:  (+) social support, motivation; (-)comorbidities: COPD, CHF, anxiety, anemia, HTN, hyperlipidemia, a-fib, a-flutter; PMH: cancer, pace maker placed, R reverse total shoulder, chronic kidney disease    Clinical Presentation  Evolving    Clinical Presentation due to:  Moderate (evolving): 1-2 personal factors/comorbidities, 3 or more body systems/activity limitations/participation restrictions      Clinical Decision Making  Moderate    Rehab Potential  Fair    PT Frequency  2x / week    PT Duration  8 weeks    PT Treatment/Interventions  ADLs/Self Care Home Management;Aquatic Therapy;Canalith Repostioning;Cryotherapy;Moist Heat;Gait training;Stair training;Functional mobility training;Therapeutic activities;Therapeutic exercise;Balance training;Neuromuscular re-education;Patient/family education;Manual techniques;Dry needling;Energy  conservation;Taping;Vestibular    PT Next Visit Plan  TUG assessment; BLE  strengthening and balance progressions as tolerated    PT Home Exercise Plan  none provided at time    Consulted and Agree with Plan of Care  Patient;Family member/caregiver    Family Member Consulted  niece, Adriana Martin       Patient will benefit from skilled therapeutic intervention in order to improve the following deficits and impairments:  Abnormal gait, Improper body mechanics, Decreased mobility, Postural dysfunction, Decreased activity tolerance, Decreased endurance, Decreased strength, Hypomobility, Difficulty walking, Decreased balance, Impaired perceived functional ability, Decreased knowledge of precautions, Decreased safety awareness, Cardiopulmonary status limiting activity, Decreased coordination  Visit Diagnosis: Difficulty in walking, not elsewhere classified  Muscle weakness (generalized)  Abnormal posture  History of falling     Problem List Patient Active Problem List   Diagnosis Date Noted  . 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/2018  . 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 Carcinoma of overlapping sites of right breast in female, estrogen receptor positive ([place]) 06/16/2016  . Acute exacerbation of CHF (congestive heart failure) ([place]) 06/07/2016  . A-fib ([place]) 04/08/2016  . COPD exacerbation ([place]) 03/22/2016  . Acute bronchitis 03/22/2016  . Atrial fibrillation with RVR ([place]) 03/22/2016  . Chronic renal insufficiency 03/22/2016  . Cough with expectoration 06/04/2015  . Thrombocytosis ([place]) 02/01/2015  . Anemia 02/01/2015  . Atrial flutter ([place]) 01/23/2015  . Atrial fibrillation ([place]) 01/23/2015  . Other long term (current) drug therapy 11/06/2014  . Chronic diastolic heart failure ([place]) 08/01/2014  . Long term current use of anticoagulant 06/06/2014  . Anxiety 05/13/2014  . Chronic obstructive pulmonary disease ([place]) 03/22/2014  . Primary biliary  cholangitis ([place]) 02/19/2014  . Avitaminosis D 02/19/2014   Myles Gip PT, DPT [phone] 10/24/2018, 5:49 PM  [place] [facility] [address] [place], [place], [address] Phone: [phone]   Fax:  [phone]  Name: Adriana Martin MRN: [id number] Date of Birth: 23-Oct-1945

## 2018-11-01 ENCOUNTER — Encounter: Payer: Self-pay | Admitting: Physical Therapy

## 2018-11-01 ENCOUNTER — Ambulatory Visit: Payer: Medicare Other | Admitting: Physical Therapy

## 2018-11-01 DIAGNOSIS — R262 Difficulty in walking, not elsewhere classified: Secondary | ICD-10-CM

## 2018-11-01 DIAGNOSIS — R293 Abnormal posture: Secondary | ICD-10-CM

## 2018-11-01 DIAGNOSIS — M6281 Muscle weakness (generalized): Secondary | ICD-10-CM

## 2018-11-01 DIAGNOSIS — Z9181 History of falling: Secondary | ICD-10-CM

## 2018-11-01 NOTE — Patient Instructions (Signed)
Access Code: [email or web address]  URL: [email or web address]  Date: 11/01/2018  Prepared by: Dorcas Carrow   Exercises  Seated Long Arc Quad - 10 reps - 2 sets - 1x daily - 7x weekly  Seated Single Leg March with Weight Shift - 10 reps - 2 sets - 1x daily - 7x weekly  Seated Heel Toe Raises - 10 reps - 2 sets - 1x daily - 7x weekly  Seated Hip Adduction Isometrics with Ball - 10 reps - 2 sets - 5 hold - 1x daily - 7x weekly

## 2018-11-01 NOTE — Therapy (Signed)
[facility] [address]. [place], [place], [address] Phone: [phone]   Fax:  [phone]  Physical Therapy Treatment  Patient Details  Name: Adriana Martin MRN: [id number] Date of Birth: 1946-08-28 Referring Provider (PT): Mancel Bale MD   Encounter Date: 11/01/2018  PT End of Session - 11/01/18 0852    Visit Number  2    Number of Visits  17    Date for PT Re-Evaluation  12/19/18    Authorization Type  2/10 (eval 10/24/2018)    PT Start Time  0750    PT Stop Time  0847    PT Time Calculation (min)  57 min    Equipment Utilized During Treatment  Gait belt    Activity Tolerance  Patient limited by fatigue;Patient limited by pain    Behavior During Therapy  Flat affect       Past Medical History:  Diagnosis Date  . Anemia   . Anxiety   . Atrial fibrillation ([place])   . B12 deficiency   . Breast cancer ([place])    RT Lumpectomy c radiation 2012  . Breast cancer ([place])   . CHF (congestive heart failure) ([place])   . Chronic kidney disease   . Cirrhosis ([place])   . COPD (chronic obstructive pulmonary disease) ([place])   . Depression   . GERD (gastroesophageal reflux disease)   . Headache   . Heart murmur   . Hyperlipemia   . Hyperlipidemia   . Hypertension   . Hypothyroidism   . Iron (Fe) deficiency anemia   . Personal history of radiation therapy   . Presence of permanent cardiac pacemaker   . Shortness of breath dyspnea   . Thrombocytopenia ([place])   . Vitamin D deficiency     Past Surgical History:  Procedure Laterality Date  . ABDOMINAL HYSTERECTOMY     Partial  . ABLATION    . APPENDECTOMY    . BICEPT TENODESIS Right 01/07/2018   Procedure: BICEPS TENODESIS;  Surgeon: Leim Fabry, MD;  Location: [facility] ORS;  Service: Orthopedics;  Laterality: Right;  . BREAST BIOPSY Right 2012  . BREAST LUMPECTOMY Right 2012   positive/rad  . BREAST SURGERY    . CARDIAC SURGERY    . CARDIAC VALVE REPLACEMENT    .  COLONOSCOPY  2012  . ELECTROPHYSIOLOGIC STUDY N/A 01/23/2015   Procedure: CARDIOVERSION;  Surgeon: Corey Skains, MD;  Location: [facility] ORS;  Service: Cardiovascular;  Laterality: N/A;  . ELECTROPHYSIOLOGIC STUDY N/A 05/27/2016   Procedure: CARDIOVERSION;  Surgeon: Corey Skains, MD;  Location: [facility] ORS;  Service: Cardiovascular;  Laterality: N/A;  . ESOPHAGOGASTRODUODENOSCOPY  2012  . EYE SURGERY    . MITRAL VALVE REPLACEMENT    . mvp    . REVERSE SHOULDER ARTHROPLASTY Right 01/07/2018   Procedure: REVERSE SHOULDER ARTHROPLASTY;  Surgeon: Leim Fabry, MD;  Location: [facility] ORS;  Service: Orthopedics;  Laterality: Right;  Marland Kitchen VSD REPAIR      There were no vitals filed for this visit.  Subjective Assessment - 11/01/18 0828    Subjective  Pt. reports falling yesterday on hardwood floor in kitchen. Pt. fell on left shoulder and hit her head and chin. Pt. has significant bruising on anterior L chest/shld and slight bruising on B knees. Pt. states she was not using AD when she fell and currently uses SPC or no AD at home but has a rollator. Pt. does not recall the details of the fall and is  accompanied by her niece to relay information on her condition.  Pts. niece reports alcohol use may have been involved in fall last night.      Patient is accompained by:  Family member    Pertinent History  PMH: a-fib, a-flutter, HTN, hyperlipidemia, anemia, anxiety, hx of falling, CHF, COPD, chronic kidney disease. Patient is largely homebound and living independently at this time; family member present intimated that patient has good social support and requires assistance to maintain independence.    Limitations  Lifting;Standing;Walking;House hold activities    Patient Stated Goals  Improve LE muscle strength/ balance.      Currently in Pain?  Yes    Pain Score  3     Pain Location  Shoulder    Pain Orientation  Left    Pain Descriptors / Indicators  Aching    Pain Type  Acute pain         Treatment:  Cryotherapy to L shld 20 min (no charge) prior to tx. Time  Neuro.mm.  Ambulating in clinic with rollator 1 lap (min CGA) Fwd/bkwd walking/ high marches in //-bars 5 laps (min-mod UE assist on //-bars) Side step in //-bars 2 laps  There.ex.:  Seated LAQ, heel/ toe raises, marching, adduction ball squeezes 1x10 of each Attemped fwd/bkwd weight shift in //-bars but stopped due to fatigue See new HEP (handouts issued).      PT Education - 11/01/18 0850    Education Details  see HEP, educated on using rollator at home, high fall risk and cryotherapy.    Person(s) Educated  Patient;Other (comment)   niece   Methods  Explanation;Demonstration    Comprehension  Verbalized understanding;Returned demonstration       PT Short Term Goals - 10/24/18 0756      PT SHORT TERM GOAL #1   Title  Pt will be independent with HEP in order to improve strength and balance in order to decrease fall risk and improve function at home and work.     Baseline  IE: not initiated    Time  4    Period  Weeks    Status  New    Target Date  11/21/18        PT Long Term Goals - 10/24/18 0757      PT LONG TERM GOAL #1   Title  Pt will improve BERG by at least 6 points in order to demonstrate clinically significant improvement in balance for decreased risk of falls at home and in the community.    Baseline  IE: 22/56    Time  8    Period  Weeks    Status  New    Target Date  12/19/18      PT LONG TERM GOAL #2   Title  Pt will improve DGI by at least 3 points in order to demonstrate clinically significant improvement in balance and decreased risk for falls.    Baseline  IE: 8/24    Time  8    Period  Weeks    Status  New    Target Date  12/19/18      PT LONG TERM GOAL #3   Title  Pt will decrease 5TSTS performed from standard height chair without use of UE support by at least 3 seconds in order to demonstrate clinically significant improvement in LE strength.    Baseline  IE:  22.6 sec, BUE support    Time  8    Period  Weeks  Status  New    Target Date  12/19/18      PT LONG TERM GOAL #4   Title  Pt will decrease TUG to below 14 seconds/decrease in order to demonstrate decreased fall risk for improved safety in the community.    Baseline  IE: deferred 2/2 to fatigue    Time  8    Period  Weeks    Status  New    Target Date  12/19/18      PT LONG TERM GOAL #5   Title  Pt will demonstrate increased function as evidenced by a FOTO score greater than or equal to 46 for improved QOL.    Baseline  IE: 32/100    Time  8    Period  Weeks    Status  New    Target Date  12/19/18         Plan - 11/01/18 0853    Clinical Impression Statement  Pt. presents to clinic with flat affect and significant bruising on L shld from fall last night. Pt. presents with good L shoulder AROM with no increase c/o pain.  L shoulder/ humerus are intact with palpation and pt./ niece instructed to contact Dr. Raechel Ache if pain worsens or any change in physical/mental status.  Pt. was able to ambulate 1 lap in clinic with rollator and min CGA from PT.  Pt. ambulated 5 laps in //-bars with min-mod B UE assist and min CGA from PT and VCs to decr. UE assist.  Pt. requested multiple rest breaks throughout therapy due to incr. fatigue. Pt. was able to complete seated therex which was provided to pt. via HEP. Pt. had incr. fatigue with adduction ball squeezes and needed VCs throughout therex to maximize LE muscle contractions but demonstrated full ROM of B LE.  Pt. attempted fwd/bkwd weight shifting in //-bars at end of session but had to stop due to incr. fatigue after 5x each side. PT educated pt. on the importance of using rollator instead of SPC in house due to her incr. fall risk and also the importance of her HEP to incr. LE muscle strength. Pt. was instructed to continue icing L shld and niece stated she would take pt. to ER if pt. worsens. Pt. will continue to benefit from skilled PT services  in order to improve B LE strength and balance to promote safe ambulation with AD in houeshold/ community.     Clinical Presentation  Evolving    Clinical Decision Making  Moderate    Rehab Potential  Fair    PT Frequency  2x / week    PT Duration  8 weeks    PT Treatment/Interventions  ADLs/Self Care Home Management;Aquatic Therapy;Canalith Repostioning;Cryotherapy;Moist Heat;Gait training;Stair training;Functional mobility training;Therapeutic activities;Therapeutic exercise;Balance training;Neuromuscular re-education;Patient/family education;Manual techniques;Dry needling;Energy conservation;Taping;Vestibular    PT Next Visit Plan  TUG assessment; BLE strengthening and balance progressions as tolerated    PT Home Exercise Plan  see handouts    Consulted and Agree with Plan of Care  Patient;Family member/caregiver    Family Member Consulted  niece, [name]       Patient will benefit from skilled therapeutic intervention in order to improve the following deficits and impairments:  Abnormal gait, Improper body mechanics, Decreased mobility, Postural dysfunction, Decreased activity tolerance, Decreased endurance, Decreased strength, Hypomobility, Difficulty walking, Decreased balance, Impaired perceived functional ability, Decreased knowledge of precautions, Decreased safety awareness, Cardiopulmonary status limiting activity, Decreased coordination  Visit Diagnosis: Difficulty in walking, not elsewhere classified  Muscle  weakness (generalized)  Abnormal posture  History of falling     Problem List Patient Active Problem List   Diagnosis Date Noted  . 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/2018  . 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 Carcinoma of overlapping sites of right breast in female, estrogen receptor positive ([place]) 06/16/2016  . Acute exacerbation of CHF (congestive heart failure) ([place]) 06/07/2016  . A-fib ([place]) 04/08/2016  . COPD exacerbation  ([place]) 03/22/2016  . Acute bronchitis 03/22/2016  . Atrial fibrillation with RVR ([place]) 03/22/2016  . Chronic renal insufficiency 03/22/2016  . Cough with expectoration 06/04/2015  . Thrombocytosis ([place]) 02/01/2015  . Anemia 02/01/2015  . Atrial flutter ([place]) 01/23/2015  . Atrial fibrillation ([place]) 01/23/2015  . Other long term (current) drug therapy 11/06/2014  . Chronic diastolic heart failure ([place]) 08/01/2014  . Long term current use of anticoagulant 06/06/2014  . Anxiety 05/13/2014  . Chronic obstructive pulmonary disease ([place]) 03/22/2014  . Primary biliary cholangitis ([place]) 02/19/2014  . Avitaminosis D 02/19/2014   Pura Spice, PT, DPT # [id number] [place], [place] 11/01/2018, 2:21 PM  [facility] [address] [place], [place], [address] Phone: [phone]   Fax:  [phone]  Name: KADIA ABAYA MRN: [id number] Date of Birth: 1946-03-17

## 2018-11-03 ENCOUNTER — Other Ambulatory Visit: Payer: Self-pay | Admitting: Internal Medicine

## 2018-11-03 ENCOUNTER — Ambulatory Visit
Admission: RE | Admit: 2018-11-03 | Discharge: 2018-11-03 | Disposition: A | Discharge status: Home / Self Care | Payer: Medicare Other | Source: Ambulatory Visit | Attending: Internal Medicine | Admitting: Internal Medicine

## 2018-11-03 ENCOUNTER — Ambulatory Visit: Payer: Medicare Other | Admitting: Physical Therapy

## 2018-11-03 ENCOUNTER — Encounter: Payer: Self-pay | Admitting: Physical Therapy

## 2018-11-03 DIAGNOSIS — R262 Difficulty in walking, not elsewhere classified: Secondary | ICD-10-CM | POA: Diagnosis not present

## 2018-11-03 DIAGNOSIS — S0990XA Unspecified injury of head, initial encounter: Secondary | ICD-10-CM

## 2018-11-03 DIAGNOSIS — Z9181 History of falling: Secondary | ICD-10-CM

## 2018-11-03 DIAGNOSIS — M6281 Muscle weakness (generalized): Secondary | ICD-10-CM

## 2018-11-03 DIAGNOSIS — R293 Abnormal posture: Secondary | ICD-10-CM

## 2018-11-03 NOTE — Therapy (Signed)
[facility] [address] [place], [place], [address] Phone: [phone]   Fax:  [phone]  Physical Therapy Treatment  Patient Details  Name: Adriana Martin MRN: [id number] Date of Birth: 08-26-1946 Referring Provider (PT): Mancel Bale MD   Encounter Date: 11/03/2018  PT End of Session - 11/03/18 0917    Visit Number  3    Number of Visits  17    Date for PT Re-Evaluation  12/19/18    Authorization Type  3/10 (eval 10/24/2018)    PT Start Time  0744    PT Stop Time  0811    PT Time Calculation (min)  27 min    Equipment Utilized During Treatment  Gait belt    Activity Tolerance  Patient limited by pain;Patient limited by fatigue    Behavior During Therapy  Flat affect       Past Medical History:  Diagnosis Date  . Anemia   . Anxiety   . Atrial fibrillation ([place])   . B12 deficiency   . Breast cancer ([place])    RT Lumpectomy c radiation 2012  . Breast cancer ([place])   . CHF (congestive heart failure) ([place])   . Chronic kidney disease   . Cirrhosis ([place])   . COPD (chronic obstructive pulmonary disease) ([place])   . Depression   . GERD (gastroesophageal reflux disease)   . Headache   . Heart murmur   . Hyperlipemia   . Hyperlipidemia   . Hypertension   . Hypothyroidism   . Iron (Fe) deficiency anemia   . Personal history of radiation therapy   . Presence of permanent cardiac pacemaker   . Shortness of breath dyspnea   . Thrombocytopenia ([place])   . Vitamin D deficiency     Past Surgical History:  Procedure Laterality Date  . ABDOMINAL HYSTERECTOMY     Partial  . ABLATION    . APPENDECTOMY    . BICEPT TENODESIS Right 01/07/2018   Procedure: BICEPS TENODESIS;  Surgeon: Leim Fabry, MD;  Location: [facility] OR[facility];  Service: Orthopedics;  Laterality: Right;  . BREAST BIOPSY Right 2012  . BREAST LUMPECTOMY Right 2012   positive/rad  . BREAST SURGERY    . CARDIAC SURGERY    . CARDIAC VALVE REPLACEMENT    .  COLONOSCOPY  2012  . ELECTROPHYSIOLOGIC STUDY N/A 01/23/2015   Procedure: CARDIOVERSION;  Surgeon: Corey Skains, MD;  Location: [facility] ORS;  Service: Cardiovascular;  Laterality: N/A;  . ELECTROPHYSIOLOGIC STUDY N/A 05/27/2016   Procedure: CARDIOVERSION;  Surgeon: Corey Skains, MD;  Location: [facility] ORS;  Service: Cardiovascular;  Laterality: N/A;  . ESOPHAGOGASTRODUODENOSCOPY  2012  . EYE SURGERY    . MITRAL VALVE REPLACEMENT    . mvp    . REVERSE SHOULDER ARTHROPLASTY Right 01/07/2018   Procedure: REVERSE SHOULDER ARTHROPLASTY;  Surgeon: Leim Fabry, MD;  Location: [facility] OR[facility];  Service: Orthopedics;  Laterality: Right;  Marland Kitchen VSD REPAIR      There were no vitals filed for this visit.  Subjective Assessment - 11/03/18 0915    Subjective  Pt. reports incr. soreness and pain in L shld from fall the other night. Pt. reports no recent falls since last visit. Pt. presents to clinic with overall flat affect and lethargy. Pt. States that she has not been sleeping well due to incr. L shld. Pain.    Patient is accompained by:  Family member    Pertinent History  PMH: a-fib, a-flutter, HTN, hyperlipidemia,  anemia, anxiety, hx of falling, CHF, COPD, chronic kidney disease. Patient is largely homebound and living independently at this time; family member present intimated that patient has good social support and requires assistance to maintain independence.    Limitations  Lifting;Standing;Walking;House hold activities    Patient Stated Goals  Improve LE muscle strength/ balance.      Currently in Pain?  Yes    Pain Score  8     Pain Location  Shoulder    Pain Orientation  Left    Pain Descriptors / Indicators  Aching    Pain Type  Acute pain        Treatment  There.ex.:  Fwd/Bkwd walking //-bars 3 laps (rest break requested) Side step in //-bars 1 lap (rest break requested) Elbow flex/ext AROM 5x (pt. Stopped due to incr. Pain and became tearful) Adduction ball squeezes 10 Seated LAQ  (attempted but stopped due to pt. shld pain and stating she was ready to leave therapy)   PT Short Term Goals - 10/24/18 0756      PT SHORT TERM GOAL #1   Title  Pt will be independent with HEP in order to improve strength and balance in order to decrease fall risk and improve function at home and work.     Baseline  IE: not initiated    Time  4    Period  Weeks    Status  New    Target Date  11/21/18        PT Long Term Goals - 10/24/18 0757      PT LONG TERM GOAL #1   Title  Pt will improve BERG by at least 6 points in order to demonstrate clinically significant improvement in balance for decreased risk of falls at home and in the community.    Baseline  IE: 22/56    Time  8    Period  Weeks    Status  New    Target Date  12/19/18      PT LONG TERM GOAL #2   Title  Pt will improve DGI by at least 3 points in order to demonstrate clinically significant improvement in balance and decreased risk for falls.    Baseline  IE: 8/24    Time  8    Period  Weeks    Status  New    Target Date  12/19/18      PT LONG TERM GOAL #3   Title  Pt will decrease 5TSTS performed from standard height chair without use of UE support by at least 3 seconds in order to demonstrate clinically significant improvement in LE strength.    Baseline  IE: 22.6 sec, BUE support    Time  8    Period  Weeks    Status  New    Target Date  12/19/18      PT LONG TERM GOAL #4   Title  Pt will decrease TUG to below 14 seconds/decrease in order to demonstrate decreased fall risk for improved safety in the community.    Baseline  IE: deferred 2/2 to fatigue    Time  8    Period  Weeks    Status  New    Target Date  12/19/18      PT LONG TERM GOAL #5   Title  Pt will demonstrate increased function as evidenced by a FOTO score greater than or equal to 46 for improved QOL.    Baseline  IE: 32/100  Time  8    Period  Weeks    Status  New    Target Date  12/19/18            Plan - 11/03/18 0920     Clinical Impression Statement  Pt. presents to clinic with flat affect and incr. L shld pain from fall earlier this week. PT assisted pt. into clinic with gait belt and rollator since pt. does not use AD. Pt. was able to complete 3 laps of fwd/bkwd walking with B UE assist (1 lap with 1 UE assist after VCs from PT) in //-bars but requested rest break.  Pt. completed 1 lap of side stepping in //-bars but needed to rest after due to shld. pain and fatigue. Pt. became tearful after this and PT reminded pt. that she could complete seated therex if too tired. PT educated her on the importance of moving elbow and not leaning on her painful shld while seated. PT provided ice to L shld but pt. stated she was ready to leave therapy, after encouragement from PT. PT instructed pts. niece that pt. may need to see Dr. Raechel Ache if pain continues to worsen.     Clinical Presentation  Evolving    Clinical Decision Making  Moderate    Rehab Potential  Fair    PT Frequency  2x / week    PT Duration  8 weeks    PT Treatment/Interventions  ADLs/Self Care Home Management;Aquatic Therapy;Canalith Repostioning;Cryotherapy;Moist Heat;Gait training;Stair training;Functional mobility training;Therapeutic activities;Therapeutic exercise;Balance training;Neuromuscular re-education;Patient/family education;Manual techniques;Dry needling;Energy conservation;Taping;Vestibular    PT Next Visit Plan  TUG assessment; BLE strengthening and balance progressions as tolerated    PT Home Exercise Plan  see handouts    Consulted and Agree with Plan of Care  Patient;Family member/caregiver    Family Member Consulted  niece, [name]       Patient will benefit from skilled therapeutic intervention in order to improve the following deficits and impairments:  Abnormal gait, Improper body mechanics, Decreased mobility, Postural dysfunction, Decreased activity tolerance, Decreased endurance, Decreased strength, Hypomobility, Difficulty walking,  Decreased balance, Impaired perceived functional ability, Decreased knowledge of precautions, Decreased safety awareness, Cardiopulmonary status limiting activity, Decreased coordination  Visit Diagnosis: Difficulty in walking, not elsewhere classified  Muscle weakness (generalized)  Abnormal posture  History of falling     Problem List Patient Active Problem List   Diagnosis Date Noted  . 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/2018  . 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 Carcinoma of overlapping sites of right breast in female, estrogen receptor positive ([place]) 06/16/2016  . Acute exacerbation of CHF (congestive heart failure) ([place]) 06/07/2016  . A-fib ([place]) 04/08/2016  . COPD exacerbation ([place]) 03/22/2016  . Acute bronchitis 03/22/2016  . Atrial fibrillation with RVR ([place]) 03/22/2016  . Chronic renal insufficiency 03/22/2016  . Cough with expectoration 06/04/2015  . Thrombocytosis ([place]) 02/01/2015  . Anemia 02/01/2015  . Atrial flutter ([place]) 01/23/2015  . Atrial fibrillation ([place]) 01/23/2015  . Other long term (current) drug therapy 11/06/2014  . Chronic diastolic heart failure ([place]) 08/01/2014  . Long term current use of anticoagulant 06/06/2014  . Anxiety 05/13/2014  . Chronic obstructive pulmonary disease ([place]) 03/22/2014  . 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 Avitaminosis D 02/19/2014   Pura Spice, PT, DPT # [id number] [place], [place] 11/03/2018, 2:03 PM  [facility] [address] [place], [place], [address] Phone: [phone]   Fax:  [phone]  Name: LAKETIA VICKNAIR MRN: [id number] Date of Birth: [birth year]

## 2018-12-05 DIAGNOSIS — R296 Repeated falls: Secondary | ICD-10-CM | POA: Insufficient documentation

## 2018-12-05 DIAGNOSIS — S42351A Displaced comminuted fracture of shaft of humerus, right arm, initial encounter for closed fracture: Secondary | ICD-10-CM | POA: Insufficient documentation

## 2019-05-25 ENCOUNTER — Other Ambulatory Visit: Payer: Self-pay

## 2019-05-25 ENCOUNTER — Other Ambulatory Visit: Payer: Self-pay | Admitting: *Deleted

## 2019-05-25 DIAGNOSIS — D473 Essential (hemorrhagic) thrombocythemia: Secondary | ICD-10-CM

## 2019-05-25 DIAGNOSIS — D472 Monoclonal gammopathy: Secondary | ICD-10-CM

## 2019-05-25 DIAGNOSIS — C50811 Malignant neoplasm of overlapping sites of right female breast: Secondary | ICD-10-CM

## 2019-06-05 ENCOUNTER — Other Ambulatory Visit: Payer: Self-pay

## 2019-06-06 ENCOUNTER — Inpatient Hospital Stay: Payer: Medicare Other | Attending: Hematology and Oncology

## 2019-06-06 DIAGNOSIS — Z853 Personal history of malignant neoplasm of breast: Secondary | ICD-10-CM | POA: Insufficient documentation

## 2019-06-06 DIAGNOSIS — Z803 Family history of malignant neoplasm of breast: Secondary | ICD-10-CM | POA: Diagnosis not present

## 2019-06-06 DIAGNOSIS — Z87891 Personal history of nicotine dependence: Secondary | ICD-10-CM | POA: Diagnosis not present

## 2019-06-06 DIAGNOSIS — E039 Hypothyroidism, unspecified: Secondary | ICD-10-CM | POA: Insufficient documentation

## 2019-06-06 DIAGNOSIS — Z90710 Acquired absence of both cervix and uterus: Secondary | ICD-10-CM | POA: Insufficient documentation

## 2019-06-06 DIAGNOSIS — Z170 Estrogen receptor positive status [ER+]: Secondary | ICD-10-CM | POA: Insufficient documentation

## 2019-06-06 DIAGNOSIS — F419 Anxiety disorder, unspecified: Secondary | ICD-10-CM | POA: Diagnosis not present

## 2019-06-06 DIAGNOSIS — Z79899 Other long term (current) drug therapy: Secondary | ICD-10-CM | POA: Insufficient documentation

## 2019-06-06 DIAGNOSIS — D472 Monoclonal gammopathy: Secondary | ICD-10-CM | POA: Diagnosis not present

## 2019-06-06 DIAGNOSIS — N183 Chronic kidney disease, stage 3 (moderate): Secondary | ICD-10-CM | POA: Diagnosis not present

## 2019-06-06 DIAGNOSIS — E785 Hyperlipidemia, unspecified: Secondary | ICD-10-CM | POA: Diagnosis not present

## 2019-06-06 DIAGNOSIS — Z923 Personal history of irradiation: Secondary | ICD-10-CM | POA: Diagnosis not present

## 2019-06-06 DIAGNOSIS — I4891 Unspecified atrial fibrillation: Secondary | ICD-10-CM | POA: Insufficient documentation

## 2019-06-06 DIAGNOSIS — D473 Essential (hemorrhagic) thrombocythemia: Secondary | ICD-10-CM

## 2019-06-06 DIAGNOSIS — J449 Chronic obstructive pulmonary disease, unspecified: Secondary | ICD-10-CM | POA: Insufficient documentation

## 2019-06-06 DIAGNOSIS — C50811 Malignant neoplasm of overlapping sites of right female breast: Secondary | ICD-10-CM

## 2019-06-06 LAB — COMPREHENSIVE METABOLIC PANEL
ALT: 28 U/L (ref 0–44)
AST: 31 U/L (ref 15–41)
Albumin: 4 g/dL (ref 3.5–5.0)
Alkaline Phosphatase: 133 U/L — ABNORMAL HIGH (ref 38–126)
Anion gap: 11 (ref 5–15)
BUN: 31 mg/dL — ABNORMAL HIGH (ref 8–23)
CO2: 26 mmol/L (ref 22–32)
Calcium: 9.4 mg/dL (ref 8.9–10.3)
Chloride: 101 mmol/L (ref 98–111)
Creatinine, Ser: 0.94 mg/dL (ref 0.44–1.00)
GFR calc Af Amer: >60 mL/min (ref >60)
GFR calc non Af Amer: 60 mL/min — ABNORMAL LOW (ref >60)
Glucose, Bld: 100 mg/dL — ABNORMAL HIGH (ref 70–99)
Potassium: 4.5 mmol/L (ref 3.5–5.1)
Sodium: 138 mmol/L (ref 135–145)
Total Bilirubin: 0.7 mg/dL (ref 0.3–1.2)
Total Protein: 7.5 g/dL (ref 6.5–8.1)

## 2019-06-06 LAB — CBC WITH DIFFERENTIAL/PLATELET
Abs Immature Granulocytes: 0.06 10*3/uL (ref 0.00–0.07)
Basophils Absolute: 0 10*3/uL (ref 0.0–0.1)
Basophils Relative: 0 %
Eosinophils Absolute: 0.2 10*3/uL (ref 0.0–0.5)
Eosinophils Relative: 2 %
HCT: 38.3 % (ref 36.0–46.0)
Hemoglobin: 12.8 g/dL (ref 12.0–15.0)
Immature Granulocytes: 1 %
Lymphocytes Relative: 10 %
Lymphs Abs: 1 10*3/uL (ref 0.7–4.0)
MCH: 29.9 pg (ref 26.0–34.0)
MCHC: 33.4 g/dL (ref 30.0–36.0)
MCV: 89.5 fL (ref 80.0–100.0)
Monocytes Absolute: 0.7 10*3/uL (ref 0.1–1.0)
Monocytes Relative: 7 %
Neutro Abs: 8.1 10*3/uL — ABNORMAL HIGH (ref 1.7–7.7)
Neutrophils Relative %: 80 %
Platelets: 321 10*3/uL (ref 150–400)
RBC: 4.28 MIL/uL (ref 3.87–5.11)
RDW: 14.3 % (ref 11.5–15.5)
WBC: 10.1 10*3/uL (ref 4.0–10.5)
nRBC: 0 % (ref 0.0–0.2)

## 2019-06-07 LAB — MULTIPLE MYELOMA PANEL, SERUM
Albumin SerPl Elph-Mcnc: 3.6 g/dL (ref 2.9–4.4)
Albumin/Glob SerPl: 1.3 (ref 0.7–1.7)
Alpha 1: 0.3 g/dL (ref 0.0–0.4)
Alpha2 Glob SerPl Elph-Mcnc: 0.9 g/dL (ref 0.4–1.0)
B-Globulin SerPl Elph-Mcnc: 1 g/dL (ref 0.7–1.3)
Gamma Glob SerPl Elph-Mcnc: 0.8 g/dL (ref 0.4–1.8)
Globulin, Total: 3 g/dL (ref 2.2–3.9)
IgA: 288 mg/dL (ref 64–422)
IgG (Immunoglobin G), Serum: 750 mg/dL (ref 586–1602)
IgM (Immunoglobulin M), Srm: 84 mg/dL (ref 26–217)
M Protein SerPl Elph-Mcnc: 0.2 g/dL — ABNORMAL HIGH
Total Protein ELP: 6.6 g/dL (ref 6.0–8.5)

## 2019-06-07 LAB — KAPPA/LAMBDA LIGHT CHAINS
Kappa free light chain: 40.8 mg/L — ABNORMAL HIGH (ref 3.3–19.4)
Kappa, lambda light chain ratio: 1.27 (ref 0.26–1.65)
Lambda free light chains: 32.1 mg/L — ABNORMAL HIGH (ref 5.7–26.3)

## 2019-06-08 NOTE — Progress Notes (Signed)
[facility]  [address] [place], [place] [address] Phone: [phone]  Fax: [phone]   Clinic Day:  06/13/2019  Referring physician: [name], MD  Chief Complaint: Adriana Martin is a 73 y.o. female with a history of stage I right breast cancer, JAK2+ thrombocytosis, and a monoclonal gammopathy of unknown significance (MGUS) who is seen for a new patient assessment.  HPI:  The patient has had a history of chronic thrombocytosis since 2001.  Platelet count ranged between 400,000 - 500,000.  She had a bone marrow in 2001 (no report available).  She was previously on hydroxyurea (discontinued in 05/2016).   She has a history of stage I right breast cancer s/p lumpectomy and SLN biopsy on 02/18/2011 by Dr [name].  Pathology revealed a 1.1 cm grade II ductal carcinoma tumor.  Tumor was ER + (> 90%), PR + (> 90%), and Her2/neu negative by FISH. Pathologic stage was T1c pN0 (sn).  She received accelerated partial breast irradiation by Dr. [name] (2012). She completed Arimidex in 08/2016. Bilateral mammogram on 06/22/2018 revealed no malignancy.    She has a history of stage III chronic kidney disease. Creatinine was 1.22 on 12/30/2014, 1.14 on 12/28/2017, 1.06 on 06/07/2018 and 0.94 on 06/06/2019.  She has a history of iron deficiency anemia and was on oral iron. Ferritin has been followed: 66 on 02/27/2014, 82 on 07/02/2015, 385 on 06/16/2016 and 609 on 03/09/2017.  Iron saturation was 17% on 03/09/2017  She was initially seen by Dr [name] on 07/02/2015.  Regarding her breast cancer, she had no evidence of recurrence.  Hemoglobin was 9.8 (low) of unclear etiology.  She was on hydroxyurea.  Work up on 07/02/2015 showed hematocrit 31.3, hemoglobin 9.9, platelets 444,000, WBC 8,400 (ANC 6,900). Ferritin was 32 with a iron saturation of 6% and a TIBC of 467. BUN was 24 and creatinine 1.64.  LDH 216. Retic was 2.0%.   She was seen by Dr.  [name] on 12/14/2017 for an abnormal M protein.  As part of the preop workup for right shoulder surgery, labs revealed an elevated creatinine of 1.99.  She was seen by Dr. [name] and noted to have an M-spike.  M-spike was 0.2. She had no hypercalcemia or significantly worsening anemia.   Work up on 12/14/2017 revealed JAK2 V617F was positive.  Kappa free light chain were 48.3, lamda free light chains 29.0, and ratio 1.67 (0.26-1.65). SPEP revealed a 0.3 gm/dL IgG monoclonal protein with lambda light chain specificity.  Bone survey on 12/14/2017 revealed scattered lytic lesions throughout the calvarium concerning for metastases or myeloma.  There was compression fractures at T8 and L1 (new since 2017), and cardiomegaly.  The patient was last seen in the medical oncology clinic by Dr. [name] on 06/14/2018. At that time, she was fatigued. She noted back and joint pain. Sodium was 129. M-spike was 0.2. She continued on Coumadin and oral iron.   Labs on 06/06/2019 revealed a hematocrit 38.3, hemoglobin 12.8, platelets 321,000, WBC 10,100 with an ANC of 8,100. BUN was 31 with a creatinine 0.94. Alkaline phosphatase was 133 (38-126). M-spike was 0.2. Kappa free light chain 40.8, lambda free light chains 32.1, and ratio 1.27.   M-spike has been followed (gm/dL): 0.3 on 12/14/2017, 0.2 on 06/07/2018, and 0.2 on 06/06/2019.  Symptomatically, she feels "good". She is not taking any oral iron. She denies any B symptoms. She denies any abdominal or urinary symptoms. She notes right shoulder pain when she moves  around a lot. She denies any other pain.  She broke her arm on 12/05/2018. She fell off the couch and landed on her arm; her kids had to break into her house to help her.  She was seen by Dr. [name] 04/13/2019 for a right humoral shaft fracture follow-up. She has never had a bone density study.   Last colonoscopy was in 2012 by Dr. [name] (no report available).    Past Medical History:  Diagnosis Date   . Anemia   . Anxiety   . Atrial fibrillation ([place])   . B12 deficiency   . Breast cancer ([place])    RT Lumpectomy c radiation 2012  . Breast cancer ([place])   . CHF (congestive heart failure) ([place])   . Chronic kidney disease   . Cirrhosis ([place])   . COPD (chronic obstructive pulmonary disease) ([place])   . Depression   . GERD (gastroesophageal reflux disease)   . Headache   . Heart murmur   . Hyperlipemia   . Hyperlipidemia   . Hypertension   . Hypothyroidism   . Iron (Fe) deficiency anemia   . Personal history of radiation therapy   . Presence of permanent cardiac pacemaker   . Shortness of breath dyspnea   . Thrombocytopenia ([place])   . Vitamin D deficiency     Past Surgical History:  Procedure Laterality Date  . ABDOMINAL HYSTERECTOMY     Partial  . ABLATION    . APPENDECTOMY    . BICEPT TENODESIS Right 01/07/2018   Procedure: BICEPS TENODESIS;  Surgeon: Leim Fabry, MD;  Location: [facility] ORS;  Service: Orthopedics;  Laterality: Right;  . BREAST BIOPSY Right 2012  . BREAST LUMPECTOMY Right 2012   positive/rad  . BREAST SURGERY    . CARDIAC SURGERY    . CARDIAC VALVE REPLACEMENT    . COLONOSCOPY  2012  . ELECTROPHYSIOLOGIC STUDY N/A 01/23/2015   Procedure: CARDIOVERSION;  Surgeon: Corey Skains, MD;  Location: [facility] ORS;  Service: Cardiovascular;  Laterality: N/A;  . ELECTROPHYSIOLOGIC STUDY N/A 05/27/2016   Procedure: CARDIOVERSION;  Surgeon: Corey Skains, MD;  Location: [facility] ORS;  Service: Cardiovascular;  Laterality: N/A;  . ESOPHAGOGASTRODUODENOSCOPY  2012  . EYE SURGERY    . MITRAL VALVE REPLACEMENT    . mvp    . REVERSE SHOULDER ARTHROPLASTY Right 01/07/2018   Procedure: REVERSE SHOULDER ARTHROPLASTY;  Surgeon: Leim Fabry, MD;  Location: [facility] ORS;  Service: Orthopedics;  Laterality: Right;  Marland Kitchen VSD REPAIR      Family History  Problem Relation Age of Onset  . Cancer Mother   . Breast cancer Mother   . Cancer Father   . Cancer Brother   . Cancer Daughter   .  Breast cancer Daughter     Social History:  reports that she quit smoking about 20 years ago. Her smoking use included cigarettes. She has a 7.50 pack-year smoking history. She has never used smokeless tobacco. She reports that she does not drink alcohol or use drugs. She lives in [place].  The patient is alone today.  Allergies:  Allergies  Allergen Reactions  . Atorvastatin Shortness Of Breath  . Calcium Shortness Of Breath  . Calcium Carbonate     Other reaction(s): Unknown  . Fluoxetine     Other reaction(s): Unknown  . Hydrochlorothiazide     Other reaction(s): Unknown    Current Medications: Current Outpatient Medications  Medication Sig Dispense Refill  . ADVAIR DISKUS 100-50 MCG/DOSE AEPB Inhale 1 puff into  the lungs 2 (two) times daily.   0  . anastrozole (ARIMIDEX) 1 MG tablet Take 1 tablet (1 mg total) by mouth daily. 30 tablet 6  . aspirin EC 81 MG tablet Take 81 mg by mouth daily.    . bisacodyl (DULCOLAX) 5 MG EC tablet Take 5 mg by mouth daily as needed for moderate constipation.    Marland Kitchen diltiazem (CARDIZEM) 90 MG tablet Take 45 mg by mouth every 6 (six) hours.    Marland Kitchen ezetimibe (ZETIA) 10 MG tablet Take 10 mg by mouth daily.    . furosemide (LASIX) 40 MG tablet Take 40 mg by mouth daily.   1  . IRON PO Take 65 mg by mouth daily.    Marland Kitchen levothyroxine (SYNTHROID, LEVOTHROID) 50 MCG tablet Take 50 mcg by mouth daily before breakfast.    . lisinopril (PRINIVIL,ZESTRIL) 5 MG tablet Take 5 mg by mouth daily.   98  . Melatonin 3 MG TABS Take 3 mg by mouth at bedtime as needed (sleep).    . Multiple Vitamins-Minerals (CENTRUM SILVER ULTRA WOMENS) TABS Take 1 tablet by mouth daily.    . Multiple Vitamins-Minerals (PRESERVISION AREDS 2 PO) Take 2 capsules by mouth daily.    . potassium chloride (K-DUR,KLOR-CON) 10 MEQ tablet Take 10 mEq by mouth 2 (two) times daily.    . pravastatin (PRAVACHOL) 20 MG tablet Take 20 mg by mouth at bedtime.    . predniSONE (DELTASONE) 5 MG tablet Take  5 mg by mouth daily with breakfast.    . Probiotic Product (PROBIOTIC DAILY PO) Take 1 capsule by mouth daily.    . sertraline (ZOLOFT) 100 MG tablet Take 100 mg by mouth daily.    . ursodiol (ACTIGALL) 300 MG capsule Take 300 mg by mouth 2 (two) times daily.     . Vitamin D, Ergocalciferol, (DRISDOL) 50000 units CAPS capsule Take 50,000 Units by mouth every 14 (fourteen) days.     Marland Kitchen warfarin (COUMADIN) 6 MG tablet Take 6 mg by mouth every evening.     Marland Kitchen apixaban (ELIQUIS) 5 MG TABS tablet Take 5 mg by mouth 2 (two) times daily.    Marland Kitchen oxyCODONE (OXY IR/ROXICODONE) 5 MG immediate release tablet Take 1-2 tablets (5-10 mg total) by mouth every 3 (three) hours as needed for moderate pain (pain score 4-6). (Patient not taking: Reported on 10/24/2018) 30 tablet 0   No current facility-administered medications for this visit.     Review of Systems  Constitutional: Positive for weight loss (10 pounds in 1 year). Negative for chills, diaphoresis, fever and malaise/fatigue.       Feels "good".  HENT: Negative.  Negative for congestion, ear discharge, ear pain, hearing loss, nosebleeds, sinus pain and sore throat.   Eyes: Negative.  Negative for blurred vision and double vision.  Respiratory: Negative.  Negative for cough, hemoptysis, sputum production and shortness of breath.   Cardiovascular: Negative.  Negative for chest pain, palpitations and leg swelling.  Gastrointestinal: Negative.  Negative for abdominal pain, blood in stool, constipation, diarrhea, melena, nausea and vomiting.  Genitourinary: Negative for dysuria, frequency, hematuria and urgency.       Chronic kidney disease.  Musculoskeletal: Positive for joint pain (right shoulder). Negative for back pain, myalgias and neck pain.       Right humoral shaft fracture on 12/05/2018.   Skin: Negative.  Negative for itching and rash.  Neurological: Negative.  Negative for dizziness, tingling, sensory change, speech change, focal weakness, weakness  and headaches.  Endo/Heme/Allergies: Negative.  Does not bruise/bleed easily.  Psychiatric/Behavioral: Negative.  Negative for depression and memory loss. The patient is not nervous/anxious and does not have insomnia.   All other systems reviewed and are negative.  Performance status (ECOG): 0  Vitals .Blood pressure 126/76, pulse 100, temperature (!) 96.8 F (36 C), temperature source Tympanic, resp. rate 18, height 5' 2"  (1.575 m), weight 166 lb 0.1 oz (75.3 kg), SpO2 99 %.  Physical Exam  Constitutional: She is oriented to person, place, and time. She appears well-developed and well-nourished. No distress.  She has a cane at her side.  HENT:  Head: Normocephalic and atraumatic.  Mouth/Throat: Oropharynx is clear and moist. No oropharyngeal exudate.  Wearing a black cap and mask.  Eyes: Pupils are equal, round, and reactive to light. Conjunctivae and EOM are normal. No scleral icterus.  Neck: Normal range of motion. Neck supple. No JVD present.  Cardiovascular: Normal rate, regular rhythm and normal heart sounds.  No murmur heard. Pulmonary/Chest: Effort normal and breath sounds normal. No respiratory distress. She has no wheezes. She has no rales. She exhibits no tenderness. Right breast exhibits skin change (9 o'clock to 10 o'clock fibrocystic chnages).  RIGHT breast scarring 9-10 o'clock, 1 o'clock fingertip nodule/scar along incision.  Abdominal: Soft. Bowel sounds are normal. She exhibits no distension and no mass. There is no abdominal tenderness. There is no rebound and no guarding.  Musculoskeletal:        General: No tenderness or edema.     Right shoulder: She exhibits decreased range of motion.  Lymphadenopathy:    She has no cervical adenopathy.    She has no axillary adenopathy.       Right: No supraclavicular adenopathy present.       Left: No supraclavicular adenopathy present.  Neurological: She is alert and oriented to person, place, and time.  Skin: Skin is warm  and dry. No rash noted. She is not diaphoretic. No erythema. No pallor.  Psychiatric: She has a normal mood and affect. Her behavior is normal. Judgment and thought content normal.  Nursing note and vitals reviewed.   No visits with results within 3 Day(s) from this visit.  Latest known visit with results is:  Appointment on 06/06/2019  Component Date Value Ref Range Status  . Kappa free light chain 06/06/2019 40.8* 3.3 - 19.4 mg/L Final  . Lamda free light chains 06/06/2019 32.1* 5.7 - 26.3 mg/L Final  . Kappa, lamda light chain ratio 06/06/2019 1.27  0.26 - 1.65 Final   Comment: (NOTE) Performed At: [facility] [place], [place] [id number] Rush Farmer MD HA:[id number]   . IgG (Immunoglobin G), Serum 06/06/2019 750  586 - 1,602 mg/dL Final  . IgA 06/06/2019 288  64 - 422 mg/dL Final  . IgM (Immunoglobulin M), Srm 06/06/2019 84  26 - 217 mg/dL Final  . Total Protein ELP 06/06/2019 6.6  6.0 - 8.5 g/dL Corrected  . Albumin SerPl Elph-Mcnc 06/06/2019 3.6  2.9 - 4.4 g/dL Corrected  . Alpha 1 06/06/2019 0.3  0.0 - 0.4 g/dL Corrected  . Alpha2 Glob SerPl Elph-Mcnc 06/06/2019 0.9  0.4 - 1.0 g/dL Corrected  . B-Globulin SerPl Elph-Mcnc 06/06/2019 1.0  0.7 - 1.3 g/dL Corrected  . Gamma Glob SerPl Elph-Mcnc 06/06/2019 0.8  0.4 - 1.8 g/dL Corrected  . M Protein SerPl Elph-Mcnc 06/06/2019 0.2* Not Observed g/dL Corrected  . Globulin, Total 06/06/2019 3.0  2.2 - 3.9 g/dL Corrected  . Albumin/Glob SerPl 06/06/2019  1.3  0.7 - 1.7 Corrected  . IFE 1 06/06/2019 Comment   Corrected   Comment: (NOTE) Immunofixation shows IgG monoclonal protein with lambda light chain specificity. Monoclonal bands detected are faint. Suggest retesting in 4-6 months.   . Please Note 06/06/2019 Comment   Corrected   Comment: (NOTE) Protein electrophoresis scan will follow via computer, mail, or courier delivery. Performed At: [facility] [place], [place] [id number]  Rush Farmer MD ZS:[id number]   . Sodium 06/06/2019 138  135 - 145 mmol/L Final  . Potassium 06/06/2019 4.5  3.5 - 5.1 mmol/L Final  . Chloride 06/06/2019 101  98 - 111 mmol/L Final  . CO2 06/06/2019 26  22 - 32 mmol/L Final  . Glucose, Bld 06/06/2019 100* 70 - 99 mg/dL Final  . BUN 06/06/2019 31* 8 - 23 mg/dL Final  . Creatinine, Ser 06/06/2019 0.94  0.44 - 1.00 mg/dL Final  . Calcium 06/06/2019 9.4  8.9 - 10.3 mg/dL Final  . Total Protein 06/06/2019 7.5  6.5 - 8.1 g/dL Final  . Albumin 06/06/2019 4.0  3.5 - 5.0 g/dL Final  . AST 06/06/2019 31  15 - 41 U/L Final  . ALT 06/06/2019 28  0 - 44 U/L Final  . Alkaline Phosphatase 06/06/2019 133* 38 - 126 U/L Final  . Total Bilirubin 06/06/2019 0.7  0.3 - 1.2 mg/dL Final  . GFR calc non Af Amer 06/06/2019 60* >60 mL/min Final  . GFR calc Af Amer 06/06/2019 >60  >60 mL/min Final  . Anion gap 06/06/2019 11  5 - 15 Final   Performed at [facility], [address] [place], [place] [address]  . WBC 06/06/2019 10.1  4.0 - 10.5 K/uL Final  . RBC 06/06/2019 4.28  3.87 - 5.11 MIL/uL Final  . Hemoglobin 06/06/2019 12.8  12.0 - 15.0 g/dL Final  . HCT 06/06/2019 38.3  36.0 - 46.0 % Final  . MCV 06/06/2019 89.5  80.0 - 100.0 fL Final  . MCH 06/06/2019 29.9  26.0 - 34.0 pg Final  . MCHC 06/06/2019 33.4  30.0 - 36.0 g/dL Final  . RDW 06/06/2019 14.3  11.5 - 15.5 % Final  . Platelets 06/06/2019 321  150 - 400 K/uL Final  . nRBC 06/06/2019 0.0  0.0 - 0.2 % Final  . Neutrophils Relative % 06/06/2019 80  % Final  . Neutro Abs 06/06/2019 8.1* 1.7 - 7.7 K/uL Final  . Lymphocytes Relative 06/06/2019 10  % Final  . Lymphs Abs 06/06/2019 1.0  0.7 - 4.0 K/uL Final  . Monocytes Relative 06/06/2019 7  % Final  . Monocytes Absolute 06/06/2019 0.7  0.1 - 1.0 K/uL Final  . Eosinophils Relative 06/06/2019 2  % Final  . Eosinophils Absolute 06/06/2019 0.2  0.0 - 0.5 K/uL Final  . Basophils Relative 06/06/2019 0  % Final  . Basophils Absolute  06/06/2019 0.0  0.0 - 0.1 K/uL Final  . Immature Granulocytes 06/06/2019 1  % Final  . Abs Immature Granulocytes 06/06/2019 0.06  0.00 - 0.07 K/uL Final   Performed at [facility], [address], [place], [place] [address]    Assessment:  Adriana Martin is a 73 y.o. female with JAK2+ thrombocytosis, stage I right breast cancer, and a monoclonal gammopathy of unknown significance (MGUS).  She has had chronic thrombocytosis since 2001.  JAK2 V617F was positive on 12/14/2017.  Platelet count ranged between 400,000 - 500,000.  She had a bone marrow in 2001 (  no report available).  She was previously on hydroxyurea (discontinued in 05/2016).    She has a history of stage I right breast cancer s/p lumpectomy and SLN biopsy on 02/18/2011.  Pathology revealed a 1.1 cm grade II ductal carcinoma tumor.  Tumor was ER + (> 90%), PR + (> 90%), and Her2/neu negative by FISH. Pathologic stage was T1c pN0 (sn).  She received accelerated partial breast irradiation (2012). She completed Arimidex in 08/2016.   Bilateral mammogram on 06/22/2018 revealed no malignancy.     She has a monoclonal gammopathy of unknown significance (MGUS).  SPEP on 12/14/2017 revealed a 0.3 gm/dL IgG monoclonal protein with lambda light chain specificity.  Bone survey on 12/14/2017 revealed scattered lytic lesions throughout the calvarium concerning for metastases or myeloma.  There was compression fractures at T8 and L1 (new since 2017).  M-spike has been followed (gm/dL): 0.3 on 12/14/2017, 0.2 on 06/07/2018, and 0.2 on 06/06/2019.   She has a history of stage III chronic kidney disease. Creatinine was 1.22 on 12/30/2014, 1.14 on 12/28/2017, 1.06 on 06/07/2018 and 0.94 on 06/06/2019.  She has a history of iron deficiency anemia.  Last colonoscopy was in 2012 (no report available).   Ferritin has been followed: 66 on 02/27/2014, 82 on 07/02/2015, 385 on 06/16/2016 and 609 on 03/09/2017.  Iron saturation was 17% on  03/09/2017.  She is not on oral iron.  Symptomatically, she feels "good". She has some right shoulder pain.  She fractured her right humerus on 12/05/2018.  Exam reveals right breast post-operative changes.  Plan: 1.   Review labs from 06/06/2019. 2.   JAK2+ thrombocytosis     Hematocrit 38.3.  Hemoglobin 12.8.  MCV 89.5.  Platelets 321,000.  WBC 10,100 on 06/06/2019.  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  Request original bone marrow report from 2001.  Patient previously on hydroxyurea.    Platelet goal< 400,000.  Continue to observe closely off [place]. 3.   Stage I right breast cancer  She is s/p lumpectomy and sentinel lymph node biopsy   She is s/p accelerated partial breast radiation and 5 years of Arimidex.  Exam reveals postoperative changes.  Continue yearly mammogram (due 06/23/2019). 4.   Monoclonal gammopathy of unknown significance (MGUS)  Review initial diagnosis and presentation.  M-spike is stable at 0.2 g/dL.  Discuss follow-up bone survey with comparison to original bone survey on 12/14/2017.  Continue surveillance every 6 months. 5.   Schedule bilateral mammogram. 6.   Schedule bone survey. 7.   RTC in 6 months for MD assessment, labs (CBC with diff, CMP, ferritin, CA27.29), review mammogram and bone survey  I discussed the assessment and treatment plan with the patient.  The patient was provided an opportunity to ask questions and all were answered.  The patient agreed with the plan and demonstrated an understanding of the instructions.  The patient was advised to call back if the symptoms worsen or if the condition fails to improve as anticipated.  I provided 30 minutes of face-to-face time during this this encounter and > 50% was spent counseling as documented under my assessment and plan.    Lequita Asal, MD, PhD    06/13/2019, 10:51 AM  I, Selena Batten, am acting as scribe for [employer]. Mike Gip, MD, PhD.  I,  Melissa C. Mike Gip, MD, have reviewed the above documentation for accuracy and completeness, and I agree with the above.

## 2019-06-09 ENCOUNTER — Ambulatory Visit: Payer: Medicare Other | Admitting: Hematology and Oncology

## 2019-06-12 ENCOUNTER — Encounter: Payer: Self-pay | Admitting: Hematology and Oncology

## 2019-06-12 NOTE — Progress Notes (Signed)
No new changes noted. The patient Name and DOB has been verified by phone. 

## 2019-06-13 ENCOUNTER — Other Ambulatory Visit: Payer: Self-pay

## 2019-06-13 ENCOUNTER — Ambulatory Visit: Payer: Medicare Other | Admitting: Internal Medicine

## 2019-06-13 ENCOUNTER — Inpatient Hospital Stay: Payer: Medicare Other | Attending: Hematology and Oncology | Admitting: Hematology and Oncology

## 2019-06-13 ENCOUNTER — Encounter: Payer: Self-pay | Admitting: Hematology and Oncology

## 2019-06-13 VITALS — BP 126/76 | HR 100 | Temp 96.8°F | Resp 18 | Ht 62.0 in | Wt 166.0 lb

## 2019-06-13 DIAGNOSIS — Z803 Family history of malignant neoplasm of breast: Secondary | ICD-10-CM | POA: Diagnosis not present

## 2019-06-13 DIAGNOSIS — J449 Chronic obstructive pulmonary disease, unspecified: Secondary | ICD-10-CM | POA: Insufficient documentation

## 2019-06-13 DIAGNOSIS — F329 Major depressive disorder, single episode, unspecified: Secondary | ICD-10-CM | POA: Insufficient documentation

## 2019-06-13 DIAGNOSIS — Z90710 Acquired absence of both cervix and uterus: Secondary | ICD-10-CM | POA: Diagnosis not present

## 2019-06-13 DIAGNOSIS — E039 Hypothyroidism, unspecified: Secondary | ICD-10-CM | POA: Diagnosis not present

## 2019-06-13 DIAGNOSIS — C50811 Malignant neoplasm of overlapping sites of right female breast: Secondary | ICD-10-CM

## 2019-06-13 DIAGNOSIS — F419 Anxiety disorder, unspecified: Secondary | ICD-10-CM | POA: Insufficient documentation

## 2019-06-13 DIAGNOSIS — D75839 Thrombocytosis, unspecified: Secondary | ICD-10-CM

## 2019-06-13 DIAGNOSIS — Z79899 Other long term (current) drug therapy: Secondary | ICD-10-CM | POA: Diagnosis not present

## 2019-06-13 DIAGNOSIS — Z87891 Personal history of nicotine dependence: Secondary | ICD-10-CM | POA: Diagnosis not present

## 2019-06-13 DIAGNOSIS — I4891 Unspecified atrial fibrillation: Secondary | ICD-10-CM | POA: Insufficient documentation

## 2019-06-13 DIAGNOSIS — Z853 Personal history of malignant neoplasm of breast: Secondary | ICD-10-CM | POA: Diagnosis not present

## 2019-06-13 DIAGNOSIS — D472 Monoclonal gammopathy: Secondary | ICD-10-CM | POA: Diagnosis not present

## 2019-06-13 DIAGNOSIS — Z170 Estrogen receptor positive status [ER+]: Secondary | ICD-10-CM | POA: Diagnosis not present

## 2019-06-13 DIAGNOSIS — N183 Chronic kidney disease, stage 3 (moderate): Secondary | ICD-10-CM | POA: Diagnosis not present

## 2019-06-13 DIAGNOSIS — D473 Essential (hemorrhagic) thrombocythemia: Secondary | ICD-10-CM

## 2019-06-13 DIAGNOSIS — E785 Hyperlipidemia, unspecified: Secondary | ICD-10-CM | POA: Diagnosis not present

## 2019-06-13 DIAGNOSIS — Z923 Personal history of irradiation: Secondary | ICD-10-CM | POA: Diagnosis not present

## 2019-06-27 ENCOUNTER — Other Ambulatory Visit: Payer: Self-pay

## 2019-06-27 ENCOUNTER — Ambulatory Visit
Admission: RE | Admit: 2019-06-27 | Discharge: 2019-06-27 | Disposition: A | Discharge status: Home / Self Care | Payer: Medicare Other | Source: Ambulatory Visit | Attending: Hematology and Oncology | Admitting: Hematology and Oncology

## 2019-06-27 DIAGNOSIS — C50811 Malignant neoplasm of overlapping sites of right female breast: Secondary | ICD-10-CM | POA: Diagnosis present

## 2019-06-27 DIAGNOSIS — Z1231 Encounter for screening mammogram for malignant neoplasm of breast: Secondary | ICD-10-CM | POA: Diagnosis not present

## 2019-06-27 DIAGNOSIS — Z170 Estrogen receptor positive status [ER+]: Secondary | ICD-10-CM | POA: Diagnosis present

## 2019-06-28 ENCOUNTER — Other Ambulatory Visit: Payer: Self-pay | Admitting: Hematology and Oncology

## 2019-06-28 DIAGNOSIS — R928 Other abnormal and inconclusive findings on diagnostic imaging of breast: Secondary | ICD-10-CM

## 2019-07-12 ENCOUNTER — Ambulatory Visit
Admission: RE | Admit: 2019-07-12 | Discharge: 2019-07-12 | Disposition: A | Discharge status: Home / Self Care | Payer: Medicare Other | Source: Ambulatory Visit | Attending: Hematology and Oncology | Admitting: Hematology and Oncology

## 2019-07-12 DIAGNOSIS — R928 Other abnormal and inconclusive findings on diagnostic imaging of breast: Secondary | ICD-10-CM | POA: Insufficient documentation

## 2019-07-13 ENCOUNTER — Other Ambulatory Visit: Payer: Self-pay | Admitting: Hematology and Oncology

## 2019-07-13 DIAGNOSIS — N6310 Unspecified lump in the right breast, unspecified quadrant: Secondary | ICD-10-CM

## 2019-07-13 DIAGNOSIS — R928 Other abnormal and inconclusive findings on diagnostic imaging of breast: Secondary | ICD-10-CM

## 2019-07-21 ENCOUNTER — Ambulatory Visit
Admission: RE | Admit: 2019-07-21 | Discharge: 2019-07-21 | Disposition: A | Discharge status: Home / Self Care | Payer: Medicare Other | Source: Ambulatory Visit | Attending: Hematology and Oncology | Admitting: Hematology and Oncology

## 2019-07-21 DIAGNOSIS — R928 Other abnormal and inconclusive findings on diagnostic imaging of breast: Secondary | ICD-10-CM

## 2019-07-21 DIAGNOSIS — N6310 Unspecified lump in the right breast, unspecified quadrant: Secondary | ICD-10-CM | POA: Insufficient documentation

## 2019-07-21 HISTORY — PX: BREAST BIOPSY: SHX20

## 2019-07-24 ENCOUNTER — Other Ambulatory Visit: Payer: Self-pay | Admitting: Anatomic Pathology & Clinical Pathology

## 2019-07-25 NOTE — Progress Notes (Signed)
[facility]  [address] [place], [place] [address] Phone: [phone]  Fax: [phone]   Clinic Day:  07/27/2019  Referring physician: [name], MD  Chief Complaint: Adriana Martin is a 73 y.o. female with a history of stage I right breast cancer, JAK2+ thrombocytosis, and a monoclonal gammopathy of unknown significance (MGUS) who is seen for review of interval right breast biopsy and discussion regarding direction of therapy.  HPI: The patient was last seen in the medical oncolog clinic on 06/13/2019 as a new patient. At that time, she felt "good". She described some right shoulder pain. She fractured her right humerus on 12/05/2018. Exam revealed extensive right breast post-operative changes.  M spike was stable at 0.2 g/dL. Bone survey was scheduled. Bilateral mammogram was scheduled. We discussed ongoing close observation off hydroxyurea.   Bilateral screening mammogram on 06/27/2019 revealed possible distortion in the right breast.   Right unilateral breast mammogram and ultrasound on 07/12/2019 revealed a suspicious 2.2 x 0.7 x 1.5 cm irregular hypoechoic mass in the right breast at 10 o'clock, with at least 3 possible satellite lesions (each measuring 3-4 mm) extending inferior and medial. There was no right axillary lymphadenopathy. Ultrasound guided biopsy was recommended for the dominant mass in the right breast.   Ultrasound guided right breast core needle biopsy on 07/21/2019 revealed a 12 mm grade II invasive mammary carcinoma with lobular features.   She saw Dr. Leim Fabry, orthopedics, on 07/11/2019. He discussed diagnosis and treatment options. Dr. [name] and the patient elected to proceed with continued conservative management of the right humerus given her age and medical co-morbidities. She was advised to avoid lifting heavy objects.    During the interim, the patient was doing "very well". She has no complaints today. She is  examines her breast monthly.  I discussed possible mastectomy; she will see Dr. Bary Castilla on 07/31/2019. I initially discussed a breast MRI.  She has a pacemaker and thus a breast MRI is not recommended.  I also recommended a bone survey (to assess her monoclonal gammopathy) and a bone density study. Patient and daughter agreed.  Patient is interested in having transportation services provided by the clinic.    Past Medical History:  Diagnosis Date  . Anemia   . Anxiety   . Atrial fibrillation ([place])   . B12 deficiency   . Breast cancer ([place])    RT Lumpectomy c radiation 2012  . Breast cancer ([place])   . CHF (congestive heart failure) ([place])   . Chronic kidney disease   . Cirrhosis ([place])   . COPD (chronic obstructive pulmonary disease) ([place])   . Depression   . GERD (gastroesophageal reflux disease)   . Headache   . Heart murmur   . Hyperlipemia   . Hyperlipidemia   . Hypertension   . Hypothyroidism   . Iron (Fe) deficiency anemia   . Personal history of radiation therapy   . Presence of permanent cardiac pacemaker   . Shortness of breath dyspnea   . Thrombocytopenia ([place])   . Vitamin D deficiency     Past Surgical History:  Procedure Laterality Date  . ABDOMINAL HYSTERECTOMY     Partial  . ABLATION    . APPENDECTOMY    . BICEPT TENODESIS Right 01/07/2018   Procedure: BICEPS TENODESIS;  Surgeon: Leim Fabry, MD;  Location: [facility] ORS;  Service: Orthopedics;  Laterality: Right;  . BREAST BIOPSY Right 2012  . BREAST BIOPSY Right 07/21/2019   venus  clip, [place] Bx, pending path   . BREAST LUMPECTOMY Right 2012   positive/rad  . BREAST SURGERY    . CARDIAC SURGERY    . CARDIAC VALVE REPLACEMENT    . COLONOSCOPY  2012  . ELECTROPHYSIOLOGIC STUDY N/A 01/23/2015   Procedure: CARDIOVERSION;  Surgeon: Corey Skains, MD;  Location: [facility] ORS;  Service: Cardiovascular;  Laterality: N/A;  . ELECTROPHYSIOLOGIC STUDY N/A 05/27/2016   Procedure: CARDIOVERSION;  Surgeon: Corey Skains, MD;   Location: [facility] ORS;  Service: Cardiovascular;  Laterality: N/A;  . ESOPHAGOGASTRODUODENOSCOPY  2012  . EYE SURGERY    . MITRAL VALVE REPLACEMENT    . mvp    . REVERSE SHOULDER ARTHROPLASTY Right 01/07/2018   Procedure: REVERSE SHOULDER ARTHROPLASTY;  Surgeon: Leim Fabry, MD;  Location: [facility] ORS;  Service: Orthopedics;  Laterality: Right;  Marland Kitchen VSD REPAIR      Family History  Problem Relation Age of Onset  . Cancer Mother   . Breast cancer Mother   . Cancer Father   . Cancer Brother   . Cancer Daughter   . Breast cancer Daughter     Social History:  reports that she quit smoking about 20 years ago. Her smoking use included cigarettes. She has a 7.50 pack-year smoking history. She has never used smokeless tobacco. She reports that she does not drink alcohol or use drugs.  She does not drive.  She lives in [place]. The patient is accompanied by her daughter, [name], today.  Allergies:  Allergies  Allergen Reactions  . Atorvastatin Shortness Of Breath  . Calcium Shortness Of Breath  . Calcium Carbonate     Other reaction(s): Unknown  . Fluoxetine     Other reaction(s): Unknown  . Hydrochlorothiazide     Other reaction(s): Unknown    Current Medications: Current Outpatient Medications  Medication Sig Dispense Refill  . ADVAIR DISKUS 100-50 MCG/DOSE AEPB Inhale 1 puff into the lungs 2 (two) times daily.   0  . anastrozole (ARIMIDEX) 1 MG tablet Take 1 tablet (1 mg total) by mouth daily. 30 tablet 6  . aspirin EC 81 MG tablet Take 81 mg by mouth daily.    . bisacodyl (DULCOLAX) 5 MG EC tablet Take 5 mg by mouth daily as needed for moderate constipation.    Marland Kitchen diltiazem (CARDIZEM) 90 MG tablet Take 45 mg by mouth every 6 (six) hours.    Marland Kitchen ezetimibe (ZETIA) 10 MG tablet Take 10 mg by mouth daily.    . furosemide (LASIX) 40 MG tablet Take 40 mg by mouth daily.   1  . IRON PO Take 65 mg by mouth daily.    Marland Kitchen levothyroxine (SYNTHROID, LEVOTHROID) 50 MCG tablet Take 50 mcg by mouth daily  before breakfast.    . lisinopril (PRINIVIL,ZESTRIL) 5 MG tablet Take 5 mg by mouth daily.   98  . Melatonin 3 MG TABS Take 3 mg by mouth at bedtime as needed (sleep).    . Multiple Vitamins-Minerals (CENTRUM SILVER ULTRA WOMENS) TABS Take 1 tablet by mouth daily.    . Multiple Vitamins-Minerals (PRESERVISION AREDS 2 PO) Take 2 capsules by mouth daily.    . potassium chloride (K-DUR,KLOR-CON) 10 MEQ tablet Take 10 mEq by mouth 2 (two) times daily.    . pravastatin (PRAVACHOL) 20 MG tablet Take 20 mg by mouth at bedtime.    . predniSONE (DELTASONE) 5 MG tablet Take 5 mg by mouth daily with breakfast.    . Probiotic Product (PROBIOTIC DAILY PO) Take  1 capsule by mouth daily.    . sertraline (ZOLOFT) 100 MG tablet Take 100 mg by mouth daily.    . ursodiol (ACTIGALL) 300 MG capsule Take 300 mg by mouth 2 (two) times daily.     . Vitamin D, Ergocalciferol, (DRISDOL) 50000 units CAPS capsule Take 50,000 Units by mouth every 14 (fourteen) days.     Marland Kitchen apixaban (ELIQUIS) 5 MG TABS tablet Take 5 mg by mouth 2 (two) times daily.    Marland Kitchen oxyCODONE (OXY IR/ROXICODONE) 5 MG immediate release tablet Take 1-2 tablets (5-10 mg total) by mouth every 3 (three) hours as needed for moderate pain (pain score 4-6). (Patient not taking: Reported on 10/24/2018) 30 tablet 0  . warfarin (COUMADIN) 6 MG tablet Take 6 mg by mouth every evening.      No current facility-administered medications for this visit.     Review of Systems  Constitutional: Negative.  Negative for chills, diaphoresis, fever, malaise/fatigue and weight loss (up 9 pounds).       Feels "fine".  HENT: Negative.  Negative for congestion, ear discharge, ear pain, hearing loss, nosebleeds, sinus pain and sore throat.   Eyes: Negative.  Negative for blurred vision and double vision.  Respiratory: Negative.  Negative for cough, hemoptysis, sputum production and shortness of breath.   Cardiovascular: Negative.  Negative for chest pain, palpitations and leg  swelling.  Gastrointestinal: Negative.  Negative for abdominal pain, blood in stool, constipation, diarrhea, melena, nausea and vomiting.  Genitourinary: Negative for dysuria, frequency, hematuria and urgency.       Chronic kidney disease.  Musculoskeletal: Negative for back pain, joint pain, myalgias and neck pain.       Right humoral shaft fracture on 12/05/2018.   Skin: Negative.  Negative for itching and rash.  Neurological: Negative.  Negative for dizziness, tingling, sensory change, speech change, focal weakness, weakness and headaches.  Endo/Heme/Allergies: Negative.  Does not bruise/bleed easily.  Psychiatric/Behavioral: Negative.  Negative for depression and memory loss. The patient is not nervous/anxious and does not have insomnia.   All other systems reviewed and are negative.  Performance status (ECOG): 0  Vitals Blood pressure 107/66, pulse 88, temperature 97.9 F (36.6 C), temperature source Tympanic, resp. rate 18, height 5' 2"  (1.575 m), weight 175 lb 2.5 oz (79.5 kg), SpO2 96 %.  Physical Exam  Constitutional: She is oriented to person, place, and time. She appears well-developed and well-nourished. No distress.  She has a cane at her side.  HENT:  Head: Normocephalic and atraumatic.  Mouth/Throat: Oropharynx is clear and moist. No oropharyngeal exudate.  Curly graying hair.  Dentures.  Mask.  Eyes: Pupils are equal, round, and reactive to light. Conjunctivae and EOM are normal. No scleral icterus.  Glasses.  Neck: Normal range of motion. Neck supple. No JVD present.  Cardiovascular: Normal rate, regular rhythm and normal heart sounds.  No murmur heard. Pulmonary/Chest: Effort normal and breath sounds normal. No respiratory distress. She has no wheezes. She has no rales. She exhibits no tenderness. Right breast exhibits skin change (right upper outer quadrant post surgical and radiation changes with fullness and subtle nodularity). Left breast exhibits no skin change  (scattered fibrocystic changes).  Abdominal: Soft. Bowel sounds are normal. She exhibits no distension and no mass. There is no abdominal tenderness. There is no rebound and no guarding.  Musculoskeletal:        General: No tenderness or edema.  Lymphadenopathy:       Head (right side): No preauricular,  no posterior auricular and no occipital adenopathy present.       Head (left side): No preauricular, no posterior auricular and no occipital adenopathy present.    She has no cervical adenopathy.    She has no axillary adenopathy.       Right: No inguinal and no supraclavicular adenopathy present.       Left: No inguinal and no supraclavicular adenopathy present.  Neurological: She is alert and oriented to person, place, and time.  Skin: Skin is warm and dry. No rash noted. She is not diaphoretic. No erythema. No pallor.  Psychiatric: She has a normal mood and affect. Her behavior is normal. Judgment and thought content normal.  Nursing note and vitals reviewed.   No visits with results within 3 Day(s) from this visit.  Latest known visit with results is:  Hospital Outpatient Visit on 07/21/2019  Component Date Value Ref Range Status  . SURGICAL PATHOLOGY 07/21/2019    Final-Edited                   Value:SURGICAL PATHOLOGY CASE: [id number] PATIENT: Adriana Martin Surgical Pathology Report  Specimen Submitted: A. Breast, right  Clinical History: Right breast mass. CA. Venus-shaped clip placed following ultrasound guided biopsy of RIGHT breast at 10 o'clock.  DIAGNOSIS: A. BREAST, RIGHT AT 10:00; ULTRASOUND-GUIDED CORE BIOPSY: - INVASIVE MAMMARY CARCINOMA, WITH LOBULAR FEATURES.  Size of invasive carcinoma: 13 mm in this sample Histologic grade of invasive carcinoma: Grade 2                      Glandular/tubular differentiation score: 3                      Nuclear pleomorphism score: 2                      Mitotic rate score: 1                      Total score: 6 Ductal  carcinoma in situ: Present, low-grade Lymphovascular invasion: Not identified  ER/PR/HER2: Immunohistochemistry will be performed on block A1, with reflex to [place] for HER2 2+. The results will be reported in an addendum.  Findings were relayed to Electa Sniff, RN at 11:50 am on 11/2                         /2020.  GROSS DESCRIPTION: Labeled: Ultrasound-guided right breast core biopsy at 10 o'clock position Received: In formalin Time/date in fixative: Tissue procedure time 8:50 AM, tissue put in formalin time 8:50 AM 07/21/2019 Cold ischemic time: Less than 1 minute Total fixation time: 8 hours Core pieces: 4 Size: 1.3 cm in length and 1.0 cm in diameter each Description: Fibrofatty soft tissue cores Ink color: Black Entirely submitted in 1 cassette.  Final Diagnosis performed by Allena Napoleon, MD.   Electronically signed 07/24/2019 11:52:53AM The electronic signature indicates that the named Attending Pathologist has evaluated the specimen Technical component performed at [facility], [address], [place], [place] [address] Lab: [phone] Dir: Rush Farmer, MD, MMM  Professional component performed at [facility], [facility], [place], [place], [place] [address] Lab: [phone] Dir: Dellia Nims. Reuel Derby, MD    Assessment:  CORDELLA NYQUIST is a 73 y.o. female with JAK2+ thrombocytosis, stage I right breast cancer, and a monoclonal gammopathy of unknown significance (MGUS).  She has had chronic  thrombocytosis since 2001.  JAK2 V617F was positive on 12/14/2017.  Platelet count ranged between 400,000 - 500,000.  She had a bone marrow in 2001 (no report available).  She was previously on hydroxyurea (discontinued in 05/2016).    She has a history of stage I right breast cancer s/p lumpectomy and SLN biopsy on 02/18/2011.  Pathology revealed a 1.1 cm grade II ductal carcinoma tumor.  Tumor was ER + (> 90%), PR + (> 90%), and Her2/neu negative by FISH. Pathologic  stage was T1c pN0 (sn).  She received accelerated partial breast irradiation (2012). She completed Arimidex in 08/2016.   Bilateral screening mammogram on 06/22/2018 revealed no malignancy.  Right unilateral breast mammogram and ultrasound on 07/12/2019 revealed a suspicious 2.2 x 0.7 x 1.5 cm irregular hypoechoic mass in the right breast at 10 o'clock, with at least 3 possible satellite lesions (each measuring 3-4 mm) extending inferior and medial. There was no right axillary lymphadenopathy. Ultrasound guided biopsy was recommended for the dominant mass in the right breast.   Ultrasound guided right breast core needle biopsy on 07/21/2019 revealed a 12 mm grade II invasive mammary carcinoma with lobular features.  Tumor is ER + (> 90%), PR + (> 90%), and Her2/neu 1+.   She has a monoclonal gammopathy of unknown significance (MGUS).  SPEP on 12/14/2017 revealed a 0.3 gm/dL IgG monoclonal protein with lambda light chain specificity.  Bone survey on 12/14/2017 revealed scattered lytic lesions throughout the calvarium concerning for metastases or myeloma.  There was compression fractures at T8 and L1 (new since 2017).  M-spike has been followed (gm/dL): 0.3 on 12/14/2017, 0.2 on 06/07/2018, and 0.2 on 06/06/2019.   She has a history of stage III chronic kidney disease. Creatinine was 1.22 on 12/30/2014, 1.14 on 12/28/2017, 1.06 on 06/07/2018 and 0.94 on 06/06/2019.  She has a history of iron deficiency anemia.  Last colonoscopy was in 2012 (no report available).   Ferritin has been followed: 66 on 02/27/2014, 82 on 07/02/2015, 385 on 06/16/2016 and 609 on 03/09/2017.  Iron saturation was 17% on 03/09/2017.  She is not on oral iron.  Symptomatically, she feels "fine".  Exam reveals right breast changes.  Plan: 1.   Labs today: CBC with diff, CMP, CA 27.29. 2.   Stage I right breast cancer (2012)             She is s/p lumpectomy and sentinel lymph node biopsy              She is s/p  accelerated partial breast radiation and 5 years of Arimidex. 3.   Recurrent right breast cancer (2020)  Review interval mammogram and biopsy.  Imaging reveals a 2.2.cm right breast mass with 3 small satellite lesions.  Tumor is an invasive mammary carcinoma with lobular features.   ER + (> 90%), PR + (> 90%), and Her2/neu 1+.  Discuss breast MRI secondary to lobular features.   Patient has a pacemaker and thus unable to be performed.  Patient has an appointment with Dr Bary Castilla regarding surgery.  Anticipate mastectomy secondary to recurrence in a field of radiation.  Discuss endocrine therapy.  Discuss obtaining a bone density.  4.   JAK2+ thrombocytosis                Hematocrit 38.3.  Hemoglobin 12.8.  MCV 89.5.  Platelets 321,000.  WBC 10,100 on 06/06/2019.             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  Original bone marrow report from 2001 remains unavailable.             Patient previously on hydroxyurea.               Platelet goal is < 400,000.             Continue to observe off hydroxyurea. 5.   Monoclonal gammopathy of unknown significance (MGUS)             M-spike was stable at 0.2 g/dL on 06/06/2019.             Re-request follow-up bone survey.             Continue surveillance every 6 months. 6.   Patient needs assistance with transportation [employer] services) as she does not drive and relies on her daughter who works. 7.   Schedule bone density on 07/31/2019. 8.   Reschedule bone survey. 9.   RTC 2 weeks after surgery for MD assessment, review of pathology, and discussion regarding direction of therapy (patient to call).  I discussed the assessment and treatment plan with the patient.  The patient was provided an opportunity to ask questions and all were answered.  The patient agreed with the plan and demonstrated an understanding of the instructions.  The patient was advised to call back if the symptoms worsen or if the  condition fails to improve as anticipated.  I provided 24 minutes of face-to-face time during this this encounter and > 50% was spent counseling as documented under my assessment and plan.    Lequita Asal, MD, PhD    07/27/2019, 9:12 AM  I, Selena Batten, am acting as scribe for [employer]. Mike Gip, MD, PhD.  I, Melissa C. Mike Gip, MD, have reviewed the above documentation for accuracy and completeness, and I agree with the above.

## 2019-07-26 ENCOUNTER — Other Ambulatory Visit: Payer: Self-pay

## 2019-07-27 ENCOUNTER — Ambulatory Visit
Admission: RE | Admit: 2019-07-27 | Discharge: 2019-07-27 | Disposition: A | Discharge status: Home / Self Care | Payer: Medicare Other | Source: Ambulatory Visit | Attending: Hematology and Oncology | Admitting: Hematology and Oncology

## 2019-07-27 ENCOUNTER — Ambulatory Visit
Admission: RE | Admit: 2019-07-27 | Discharge: 2019-07-27 | Disposition: A | Discharge status: Home / Self Care | Payer: Medicare Other | Attending: Hematology and Oncology | Admitting: Hematology and Oncology

## 2019-07-27 ENCOUNTER — Inpatient Hospital Stay: Payer: Medicare Other | Attending: Hematology and Oncology | Admitting: Hematology and Oncology

## 2019-07-27 ENCOUNTER — Other Ambulatory Visit: Payer: Self-pay

## 2019-07-27 ENCOUNTER — Encounter: Payer: Self-pay | Admitting: Hematology and Oncology

## 2019-07-27 VITALS — BP 107/66 | HR 88 | Temp 97.9°F | Resp 18 | Ht 62.0 in | Wt 175.2 lb

## 2019-07-27 DIAGNOSIS — D472 Monoclonal gammopathy: Secondary | ICD-10-CM | POA: Insufficient documentation

## 2019-07-27 DIAGNOSIS — F419 Anxiety disorder, unspecified: Secondary | ICD-10-CM | POA: Diagnosis not present

## 2019-07-27 DIAGNOSIS — Z803 Family history of malignant neoplasm of breast: Secondary | ICD-10-CM | POA: Insufficient documentation

## 2019-07-27 DIAGNOSIS — Z7951 Long term (current) use of inhaled steroids: Secondary | ICD-10-CM | POA: Insufficient documentation

## 2019-07-27 DIAGNOSIS — N189 Chronic kidney disease, unspecified: Secondary | ICD-10-CM | POA: Diagnosis not present

## 2019-07-27 DIAGNOSIS — F329 Major depressive disorder, single episode, unspecified: Secondary | ICD-10-CM | POA: Diagnosis not present

## 2019-07-27 DIAGNOSIS — C50811 Malignant neoplasm of overlapping sites of right female breast: Secondary | ICD-10-CM

## 2019-07-27 DIAGNOSIS — C50411 Malignant neoplasm of upper-outer quadrant of right female breast: Secondary | ICD-10-CM | POA: Diagnosis not present

## 2019-07-27 DIAGNOSIS — D473 Essential (hemorrhagic) thrombocythemia: Secondary | ICD-10-CM

## 2019-07-27 DIAGNOSIS — D75839 Thrombocytosis, unspecified: Secondary | ICD-10-CM

## 2019-07-27 DIAGNOSIS — J449 Chronic obstructive pulmonary disease, unspecified: Secondary | ICD-10-CM | POA: Insufficient documentation

## 2019-07-27 DIAGNOSIS — Z950 Presence of cardiac pacemaker: Secondary | ICD-10-CM | POA: Diagnosis not present

## 2019-07-27 DIAGNOSIS — Z7901 Long term (current) use of anticoagulants: Secondary | ICD-10-CM | POA: Diagnosis not present

## 2019-07-27 DIAGNOSIS — E039 Hypothyroidism, unspecified: Secondary | ICD-10-CM | POA: Insufficient documentation

## 2019-07-27 DIAGNOSIS — Z7982 Long term (current) use of aspirin: Secondary | ICD-10-CM | POA: Diagnosis not present

## 2019-07-27 DIAGNOSIS — Z79899 Other long term (current) drug therapy: Secondary | ICD-10-CM | POA: Insufficient documentation

## 2019-07-27 DIAGNOSIS — Z923 Personal history of irradiation: Secondary | ICD-10-CM | POA: Insufficient documentation

## 2019-07-27 DIAGNOSIS — Z90710 Acquired absence of both cervix and uterus: Secondary | ICD-10-CM | POA: Diagnosis not present

## 2019-07-27 DIAGNOSIS — Z79811 Long term (current) use of aromatase inhibitors: Secondary | ICD-10-CM | POA: Diagnosis not present

## 2019-07-27 DIAGNOSIS — I10 Essential (primary) hypertension: Secondary | ICD-10-CM | POA: Insufficient documentation

## 2019-07-27 DIAGNOSIS — Z87891 Personal history of nicotine dependence: Secondary | ICD-10-CM | POA: Diagnosis not present

## 2019-07-27 DIAGNOSIS — E785 Hyperlipidemia, unspecified: Secondary | ICD-10-CM | POA: Diagnosis not present

## 2019-07-27 DIAGNOSIS — Z170 Estrogen receptor positive status [ER+]: Secondary | ICD-10-CM | POA: Insufficient documentation

## 2019-07-27 DIAGNOSIS — I4891 Unspecified atrial fibrillation: Secondary | ICD-10-CM | POA: Insufficient documentation

## 2019-07-27 LAB — SURGICAL PATHOLOGY

## 2019-07-27 NOTE — Progress Notes (Signed)
No new changes noted today 

## 2019-07-27 NOTE — Progress Notes (Signed)
Introduced IT trainer to patient, daughter [name].  Scheduled consults with Dr. Mike Gip, who is already a provider for patient, and with Dr. Bary Castilla.  Patient has a history of right breast cancer in 2012 treated with lumpectomy, radiation, and antihormonal for 5 years.  Family reports patient in early stages of dementia, and requests information presented to patient through her daughter [name]  .

## 2019-07-31 ENCOUNTER — Other Ambulatory Visit: Payer: Self-pay | Admitting: General Surgery

## 2019-07-31 ENCOUNTER — Inpatient Hospital Stay: Payer: Medicare Other

## 2019-07-31 DIAGNOSIS — Z170 Estrogen receptor positive status [ER+]: Secondary | ICD-10-CM

## 2019-07-31 DIAGNOSIS — C50411 Malignant neoplasm of upper-outer quadrant of right female breast: Secondary | ICD-10-CM

## 2019-07-31 MED ORDER — LIDOCAINE-PRILOCAINE 2.5-2.5 % EX CREA: TOPICAL_CREAM | CUTANEOUS | 0 refills | Status: DC

## 2019-08-02 ENCOUNTER — Other Ambulatory Visit: Payer: Self-pay | Admitting: General Surgery

## 2019-08-04 ENCOUNTER — Encounter
Admission: RE | Admit: 2019-08-04 | Discharge: 2019-08-04 | Disposition: A | Discharge status: Home / Self Care | Payer: Medicare Other | Source: Ambulatory Visit | Attending: General Surgery | Admitting: General Surgery

## 2019-08-04 ENCOUNTER — Other Ambulatory Visit: Payer: Self-pay

## 2019-08-04 DIAGNOSIS — Z20828 Contact with and (suspected) exposure to other viral communicable diseases: Secondary | ICD-10-CM | POA: Diagnosis not present

## 2019-08-04 DIAGNOSIS — Z01812 Encounter for preprocedural laboratory examination: Secondary | ICD-10-CM | POA: Insufficient documentation

## 2019-08-04 LAB — SARS CORONAVIRUS 2 (TAT 6-24 HRS): SARS Coronavirus 2: NEGATIVE

## 2019-08-04 NOTE — Patient Instructions (Addendum)
  Your procedure is scheduled on: Wednesday August 09, 2019 Report to Registration 1st floor [place] @ 9:45 am  Remember: Instructions that are not followed completely may result in serious medical risk, up to and including death, or upon the discretion of your surgeon and anesthesiologist your surgery may need to be rescheduled.    __x__ 1. Do not eat food (including mints, candies, chewing gum) after midnight the night before your procedure. You may drink clear liquids up to 2 hours before you are scheduled to arrive at the hospital for your procedure.  Do not drink anything within 2 hours of your scheduled arrival to the hospital.  Approved clear liquids:  --Water or Apple juice without pulp  --Clear carbohydrate beverage such as Gatorade or Powerade  --Black Coffee or Clear Tea (No milk, no creamers, do not add anything to the coffee or tea)    __x__ 2. No Alcohol for 24 hours before or after surgery.   __x__ 3. No Smoking or e-cigarettes for 24 hours before surgery.  Do not use any chewable tobacco products for at least 6 hours before surgery.   __x__ 4. Notify your doctor if there is any change in your medical condition (cold, fever, infections).   __x__ 5. On the morning of surgery brush your teeth with toothpaste and water.  You may rinse your mouth with mouthwash if you wish.  Do not swallow any toothpaste or mouthwash.  Please read over the following fact sheets that you were given:   [facility] Preparing for Surgery and/or MRSA Information    __x__ Use CHG Soap as directed on instruction sheet.   Do not wear jewelry, make-up, hairpins, clips or nail polish on the day of surgery.  Do not wear lotions, powders, deodorant, or perfumes.   Do not shave below the face/neck 48 hours prior to surgery.   Do not bring valuables to the hospital.    [facility] is not responsible for any belongings or valuables.               Contacts, dentures or bridgework may not be worn  into surgery.  For patients discharged on the day of surgery, you will NOT be permitted to drive yourself home.  You must have a responsible adult with you for 24 hours after surgery.  __x__ Take these medicines on the morning of surgery with a SMALL SIP OF WATER:  1. Diltiazem/Tiazac  2. Levothyroxine/Synthroid  3. Sertraline/Zoloft  4. Ursodiol/Actigall  5. Tylenol if needed  Skip your Lisinopril and Furosemide (Lasix) only on the morning of surgery.  __x__ Follow recommendations from Cardiologist, Pulmonologist or PCP regarding stopping Aspirin, Coumadin, Plavix, Eliquis, Effient, Pradaxa, and Pletal.  __x__ TODAY: Stop Anti-inflammatories such as Advil, Ibuprofen, Motrin, Aleve, Naproxen, Naprosyn, BC/Goodies powders or aspirin products. You may continue to take Tylenol and Celebrex.   __x__ TODAY: Stop supplements until after surgery. You may continue to take Vitamin D, Vitamin B, and multivitamin.

## 2019-08-08 MED ORDER — CEFAZOLIN SODIUM-DEXTROSE 2-4 GM/100ML-% IV SOLN
2.0000 g | INTRAVENOUS | Status: AC
  Administered 2019-08-09: 2 g via INTRAVENOUS

## 2019-08-09 ENCOUNTER — Ambulatory Visit: Payer: Medicare Other | Admitting: Anesthesiology

## 2019-08-09 ENCOUNTER — Encounter
Admission: RE | Disposition: A | Discharge status: Home / Self Care | Payer: Self-pay | Source: Home / Self Care | Attending: General Surgery

## 2019-08-09 ENCOUNTER — Encounter
Admission: RE | Admit: 2019-08-09 | Discharge: 2019-08-09 | Disposition: A | Discharge status: Home / Self Care | Payer: Medicare Other | Source: Ambulatory Visit | Attending: General Surgery | Admitting: General Surgery

## 2019-08-09 ENCOUNTER — Ambulatory Visit
Admission: RE | Admit: 2019-08-09 | Discharge: 2019-08-09 | Disposition: A | Discharge status: Home / Self Care | Payer: Medicare Other | Attending: General Surgery | Admitting: General Surgery

## 2019-08-09 ENCOUNTER — Other Ambulatory Visit: Payer: Self-pay

## 2019-08-09 ENCOUNTER — Encounter: Payer: Self-pay | Admitting: *Deleted

## 2019-08-09 ENCOUNTER — Inpatient Hospital Stay: Admission: RE | Admit: 2019-08-09 | Payer: Medicare Other | Source: Ambulatory Visit

## 2019-08-09 DIAGNOSIS — Z803 Family history of malignant neoplasm of breast: Secondary | ICD-10-CM | POA: Insufficient documentation

## 2019-08-09 DIAGNOSIS — Z87891 Personal history of nicotine dependence: Secondary | ICD-10-CM | POA: Insufficient documentation

## 2019-08-09 DIAGNOSIS — F419 Anxiety disorder, unspecified: Secondary | ICD-10-CM | POA: Insufficient documentation

## 2019-08-09 DIAGNOSIS — I509 Heart failure, unspecified: Secondary | ICD-10-CM | POA: Diagnosis not present

## 2019-08-09 DIAGNOSIS — D509 Iron deficiency anemia, unspecified: Secondary | ICD-10-CM | POA: Diagnosis not present

## 2019-08-09 DIAGNOSIS — Z853 Personal history of malignant neoplasm of breast: Secondary | ICD-10-CM | POA: Insufficient documentation

## 2019-08-09 DIAGNOSIS — C50911 Malignant neoplasm of unspecified site of right female breast: Secondary | ICD-10-CM | POA: Diagnosis not present

## 2019-08-09 DIAGNOSIS — E668 Other obesity: Secondary | ICD-10-CM | POA: Diagnosis not present

## 2019-08-09 DIAGNOSIS — J449 Chronic obstructive pulmonary disease, unspecified: Secondary | ICD-10-CM | POA: Insufficient documentation

## 2019-08-09 DIAGNOSIS — Z170 Estrogen receptor positive status [ER+]: Secondary | ICD-10-CM | POA: Diagnosis not present

## 2019-08-09 DIAGNOSIS — Z888 Allergy status to other drugs, medicaments and biological substances status: Secondary | ICD-10-CM | POA: Insufficient documentation

## 2019-08-09 DIAGNOSIS — Z952 Presence of prosthetic heart valve: Secondary | ICD-10-CM | POA: Insufficient documentation

## 2019-08-09 DIAGNOSIS — I4891 Unspecified atrial fibrillation: Secondary | ICD-10-CM | POA: Insufficient documentation

## 2019-08-09 DIAGNOSIS — I130 Hypertensive heart and chronic kidney disease with heart failure and stage 1 through stage 4 chronic kidney disease, or unspecified chronic kidney disease: Secondary | ICD-10-CM | POA: Insufficient documentation

## 2019-08-09 DIAGNOSIS — E785 Hyperlipidemia, unspecified: Secondary | ICD-10-CM | POA: Insufficient documentation

## 2019-08-09 DIAGNOSIS — R011 Cardiac murmur, unspecified: Secondary | ICD-10-CM | POA: Insufficient documentation

## 2019-08-09 DIAGNOSIS — E039 Hypothyroidism, unspecified: Secondary | ICD-10-CM | POA: Insufficient documentation

## 2019-08-09 DIAGNOSIS — Z923 Personal history of irradiation: Secondary | ICD-10-CM | POA: Diagnosis not present

## 2019-08-09 DIAGNOSIS — Z950 Presence of cardiac pacemaker: Secondary | ICD-10-CM | POA: Insufficient documentation

## 2019-08-09 DIAGNOSIS — E559 Vitamin D deficiency, unspecified: Secondary | ICD-10-CM | POA: Insufficient documentation

## 2019-08-09 DIAGNOSIS — F329 Major depressive disorder, single episode, unspecified: Secondary | ICD-10-CM | POA: Diagnosis not present

## 2019-08-09 DIAGNOSIS — K219 Gastro-esophageal reflux disease without esophagitis: Secondary | ICD-10-CM | POA: Insufficient documentation

## 2019-08-09 DIAGNOSIS — I080 Rheumatic disorders of both mitral and aortic valves: Secondary | ICD-10-CM | POA: Diagnosis not present

## 2019-08-09 DIAGNOSIS — C773 Secondary and unspecified malignant neoplasm of axilla and upper limb lymph nodes: Secondary | ICD-10-CM | POA: Diagnosis not present

## 2019-08-09 DIAGNOSIS — Z7982 Long term (current) use of aspirin: Secondary | ICD-10-CM | POA: Insufficient documentation

## 2019-08-09 DIAGNOSIS — Z6832 Body mass index (BMI) 32.0-32.9, adult: Secondary | ICD-10-CM | POA: Diagnosis not present

## 2019-08-09 DIAGNOSIS — K7460 Unspecified cirrhosis of liver: Secondary | ICD-10-CM | POA: Insufficient documentation

## 2019-08-09 DIAGNOSIS — Z7951 Long term (current) use of inhaled steroids: Secondary | ICD-10-CM | POA: Insufficient documentation

## 2019-08-09 DIAGNOSIS — C50411 Malignant neoplasm of upper-outer quadrant of right female breast: Secondary | ICD-10-CM

## 2019-08-09 DIAGNOSIS — Z79899 Other long term (current) drug therapy: Secondary | ICD-10-CM | POA: Insufficient documentation

## 2019-08-09 DIAGNOSIS — R519 Headache, unspecified: Secondary | ICD-10-CM | POA: Insufficient documentation

## 2019-08-09 DIAGNOSIS — N189 Chronic kidney disease, unspecified: Secondary | ICD-10-CM | POA: Insufficient documentation

## 2019-08-09 DIAGNOSIS — D696 Thrombocytopenia, unspecified: Secondary | ICD-10-CM | POA: Insufficient documentation

## 2019-08-09 HISTORY — PX: MASTECTOMY WITH AXILLARY LYMPH NODE DISSECTION: SHX5661

## 2019-08-09 SURGERY — MASTECTOMY WITH AXILLARY LYMPH NODE DISSECTION
Anesthesia: General | Laterality: Right

## 2019-08-09 MED ORDER — FENTANYL CITRATE (PF) 100 MCG/2ML IJ SOLN: 25.0000 ug | INTRAMUSCULAR | Status: DC | PRN

## 2019-08-09 MED ORDER — TECHNETIUM TC 99M SULFUR COLLOID FILTERED
1.0000 | Freq: Once | INTRAVENOUS | Status: AC | PRN
  Administered 2019-08-09: 0.831 via INTRADERMAL

## 2019-08-09 MED ORDER — FENTANYL CITRATE (PF) 100 MCG/2ML IJ SOLN
INTRAMUSCULAR | Status: DC | PRN
  Administered 2019-08-09: 50 ug via INTRAVENOUS
  Administered 2019-08-09 (×2): 25 ug via INTRAVENOUS

## 2019-08-09 MED ORDER — DEXAMETHASONE SODIUM PHOSPHATE 10 MG/ML IJ SOLN
INTRAMUSCULAR | Status: DC | PRN
  Administered 2019-08-09: 10 mg via INTRAVENOUS

## 2019-08-09 MED ORDER — ONDANSETRON HCL 4 MG/2ML IJ SOLN
INTRAMUSCULAR | Status: DC | PRN
  Administered 2019-08-09: 4 mg via INTRAVENOUS

## 2019-08-09 MED ORDER — OXYCODONE HCL 5 MG/5ML PO SOLN: 5.0000 mg | Freq: Once | ORAL | Status: DC | PRN

## 2019-08-09 MED ORDER — FAMOTIDINE 20 MG PO TABS
20.0000 mg | ORAL_TABLET | Freq: Once | ORAL | Status: AC
  Administered 2019-08-09: 20 mg via ORAL

## 2019-08-09 MED ORDER — HYDROCODONE-ACETAMINOPHEN 5-325 MG PO TABS: 1.0000 | ORAL_TABLET | ORAL | 0 refills | Status: DC | PRN

## 2019-08-09 MED ORDER — LIDOCAINE HCL (CARDIAC) PF 100 MG/5ML IV SOSY
PREFILLED_SYRINGE | INTRAVENOUS | Status: DC | PRN
  Administered 2019-08-09: 100 mg via INTRAVENOUS

## 2019-08-09 MED ORDER — SEVOFLURANE IN SOLN
RESPIRATORY_TRACT | Status: AC
  Filled 2019-08-09: qty 250

## 2019-08-09 MED ORDER — FAMOTIDINE 20 MG PO TABS
ORAL_TABLET | ORAL | Status: AC
  Filled 2019-08-09: qty 1

## 2019-08-09 MED ORDER — PROMETHAZINE HCL 25 MG/ML IJ SOLN: 6.2500 mg | INTRAMUSCULAR | Status: DC | PRN

## 2019-08-09 MED ORDER — PHENYLEPHRINE HCL (PRESSORS) 10 MG/ML IV SOLN
INTRAVENOUS | Status: DC | PRN
  Administered 2019-08-09 (×2): 100 ug via INTRAVENOUS

## 2019-08-09 MED ORDER — MEPERIDINE HCL 50 MG/ML IJ SOLN: 6.2500 mg | INTRAMUSCULAR | Status: DC | PRN

## 2019-08-09 MED ORDER — KETOROLAC TROMETHAMINE 30 MG/ML IJ SOLN
INTRAMUSCULAR | Status: DC | PRN
  Administered 2019-08-09: 15 mg via INTRAVENOUS

## 2019-08-09 MED ORDER — LACTATED RINGERS IV SOLN
INTRAVENOUS | Status: DC | PRN
  Administered 2019-08-09: 12:00:00 via INTRAVENOUS

## 2019-08-09 MED ORDER — ACETAMINOPHEN 10 MG/ML IV SOLN
INTRAVENOUS | Status: DC | PRN
  Administered 2019-08-09: 1000 mg via INTRAVENOUS

## 2019-08-09 MED ORDER — METHYLENE BLUE 0.5 % INJ SOLN
INTRAVENOUS | Status: DC | PRN
  Administered 2019-08-09: 5 mL

## 2019-08-09 MED ORDER — ACETAMINOPHEN 10 MG/ML IV SOLN
INTRAVENOUS | Status: AC
  Filled 2019-08-09: qty 100

## 2019-08-09 MED ORDER — OXYCODONE HCL 5 MG PO TABS: 5.0000 mg | ORAL_TABLET | Freq: Once | ORAL | Status: DC | PRN

## 2019-08-09 MED ORDER — FENTANYL CITRATE (PF) 100 MCG/2ML IJ SOLN
INTRAMUSCULAR | Status: AC
  Filled 2019-08-09: qty 2

## 2019-08-09 MED ORDER — METHYLENE BLUE 0.5 % INJ SOLN
INTRAVENOUS | Status: AC
  Filled 2019-08-09: qty 10

## 2019-08-09 MED ORDER — SODIUM CHLORIDE 0.9 % IV SOLN
INTRAVENOUS | Status: DC
  Administered 2019-08-09: 10:00:00 via INTRAVENOUS

## 2019-08-09 MED ORDER — CEFAZOLIN SODIUM-DEXTROSE 2-4 GM/100ML-% IV SOLN
INTRAVENOUS | Status: AC
  Filled 2019-08-09: qty 100

## 2019-08-09 MED ORDER — PROPOFOL 10 MG/ML IV BOLUS
INTRAVENOUS | Status: DC | PRN
  Administered 2019-08-09: 160 mg via INTRAVENOUS

## 2019-08-09 SURGICAL SUPPLY — 51 items
APPLIER CLIP 11 MED OPEN (CLIP)
APPLIER CLIP 13 LRG OPEN (CLIP)
BLADE PHOTON ILLUMINATED (MISCELLANEOUS) ×3 IMPLANT
BLADE SURG 15 STRL SS SAFETY (BLADE) ×3 IMPLANT
BULB RESERV EVAC DRAIN JP 100C (MISCELLANEOUS) ×3 IMPLANT
CANISTER SUCT 1200ML W/VALVE (MISCELLANEOUS) ×3 IMPLANT
CHLORAPREP W/TINT 26 (MISCELLANEOUS) ×3 IMPLANT
CLIP APPLIE 11 MED OPEN (CLIP) IMPLANT
CLIP APPLIE 13 LRG OPEN (CLIP) IMPLANT
CLOSURE WOUND 1/2 X4 (GAUZE/BANDAGES/DRESSINGS) ×2
CNTNR SPEC 2.5X3XGRAD LEK (MISCELLANEOUS)
CONT SPEC 4OZ STER OR WHT (MISCELLANEOUS)
CONTAINER SPEC 2.5X3XGRAD LEK (MISCELLANEOUS) IMPLANT
COVER WAND RF STERILE (DRAPES) IMPLANT
DRAIN CHANNEL JP 15F RND 16 (MISCELLANEOUS) ×3 IMPLANT
DRAPE LAPAROTOMY TRNSV 106X77 (MISCELLANEOUS) ×3 IMPLANT
DRSG GAUZE FLUFF 36X18 (GAUZE/BANDAGES/DRESSINGS) ×6 IMPLANT
DRSG TELFA 3X8 NADH (GAUZE/BANDAGES/DRESSINGS) ×3 IMPLANT
ELECT CAUTERY BLADE TIP 2.5 (TIP) ×3
ELECT REM PT RETURN 9FT ADLT (ELECTROSURGICAL) ×3
ELECTRODE CAUTERY BLDE TIP 2.5 (TIP) ×1 IMPLANT
ELECTRODE REM PT RTRN 9FT ADLT (ELECTROSURGICAL) ×1 IMPLANT
GLOVE BIO SURGEON STRL SZ7 (GLOVE) ×3 IMPLANT
GLOVE BIO SURGEON STRL SZ7.5 (GLOVE) ×3 IMPLANT
GLOVE BIOGEL PI IND STRL 7.5 (GLOVE) ×1 IMPLANT
GLOVE BIOGEL PI INDICATOR 7.5 (GLOVE) ×2
GLOVE INDICATOR 8.0 STRL GRN (GLOVE) ×3 IMPLANT
GOWN STRL REUS W/ TWL LRG LVL3 (GOWN DISPOSABLE) ×2 IMPLANT
GOWN STRL REUS W/TWL LRG LVL3 (GOWN DISPOSABLE) ×4
LABEL OR SOLS (LABEL) ×3 IMPLANT
PACK BASIN MINOR ARMC (MISCELLANEOUS) ×3 IMPLANT
PIN SAFETY STRL (MISCELLANEOUS) ×3 IMPLANT
RETRACTOR RING XSMALL (MISCELLANEOUS) IMPLANT
RTRCTR WOUND ALEXIS 13CM XS SH (MISCELLANEOUS)
SHEARS FOC LG CVD HARMONIC 17C (MISCELLANEOUS) ×3 IMPLANT
SLEVE PROBE SENORX GAMMA FIND (MISCELLANEOUS) ×3 IMPLANT
SPONGE LAP 18X18 RF (DISPOSABLE) ×6 IMPLANT
STRIP CLOSURE SKIN 1/2X4 (GAUZE/BANDAGES/DRESSINGS) ×4 IMPLANT
SUT ETHILON 3-0 FS-10 30 BLK (SUTURE) ×3
SUT SILK 2 0 (SUTURE) ×2
SUT SILK 2-0 30XBRD TIE 12 (SUTURE) ×1 IMPLANT
SUT SILK 3 0 (SUTURE) ×2
SUT SILK 3-0 18XBRD TIE 12 (SUTURE) ×1 IMPLANT
SUT VIC AB 2-0 CT1 27 (SUTURE) ×8
SUT VIC AB 2-0 CT1 TAPERPNT 27 (SUTURE) ×4 IMPLANT
SUT VIC AB 3-0 SH 27 (SUTURE) ×2
SUT VIC AB 3-0 SH 27X BRD (SUTURE) ×1 IMPLANT
SUT VICRYL+ 3-0 144IN (SUTURE) ×3 IMPLANT
SUTURE EHLN 3-0 FS-10 30 BLK (SUTURE) ×1 IMPLANT
SWABSTK COMLB BENZOIN TINCTURE (MISCELLANEOUS) ×3 IMPLANT
TAPE TRANSPORE STRL 2 31045 (GAUZE/BANDAGES/DRESSINGS) ×3 IMPLANT

## 2019-08-09 NOTE — Op Note (Signed)
Preoperative diagnosis: Right breast cancer, new primary.  Postoperative diagnosis: Same.  Operative procedure: Right simple mastectomy, attempted sentinel node biopsy, axillary dissection.  Operating surgeon: Hervey Ard, MD.  Anesthesia: General by LMA.  Estimated blood loss: Less than 50 cc.  Clinical note: This 73 year old woman was treated for breast cancer approximately 6 years ago with breast conservation and whole breast radiation.  She developed a new mammographic and palpable abnormality biopsy which showed invasive mammary carcinoma.  Based on her previous history she was felt to be best managed by mastectomy.  She did receive an injection with technetium sulfur colloid the morning of the procedure.  She received Ancef on induction of anesthesia.  Operative note: With the patient under adequate general anesthesia 5 cc of 0.5% methylene blue was infiltrated in the subareolar plexus after alcohol prep.  ChloraPrep was applied to the skin.  An elliptical incision was outlined and the skin incised sharply.  The remaining dissection was completed with the photon blade.  Flaps were elevated superiorly to the clavicle, medially to the sternum, inferiorly to the rectus fascia and laterally to the serratus muscle.  Hemostasis was with electrocautery and 3-0 Vicryl ties.  The breast was elevated off the underlying pectoralis muscle taking the fascia with the specimen.  Scanning to the axillary tail with the node seeker device showed no areas of increased uptake.  Attention was turned to the open axilla.  Scanning here showed no areas of increased uptake.  Based on the tumor size based on mammography and ultrasound it was elected to proceed to formal axillary dissection.  The margins of dissection were the axillary vein superiorly, chest wall l medially and the chest wall laterally.  The harmonic scalpel was used for hemostasis.  The long thoracic nerve of Bell and thoracodorsal nerve artery and  vein bundles were identified and protected.  The specimens were sent in formalin for routine histology.  A 15 French drain was placed to the lower medial flap and anchored into position with a 3-0 nylon suture.  The skin flaps were then approximated with a running 2-0 Vicryl deep dermal suture in 2 segments.  Benzoin and Steri-Strips were applied.  The drain was placed to self suction.  Fluff gauze and a compressive wrap were placed.  The patient tolerated the procedure well was taken recovery room in stable condition.

## 2019-08-09 NOTE — Anesthesia Preprocedure Evaluation (Signed)
Anesthesia Evaluation  Patient identified by MRN, date of birth, ID band Patient awake    Reviewed: Allergy & Precautions, NPO status , Patient's Chart, lab work & pertinent test results  History of Anesthesia Complications Negative for: history of anesthetic complications  Airway Mallampati: II  TM Distance: >3 FB Neck ROM: Full    Dental  (+) Edentulous Upper, Edentulous Lower   Pulmonary COPD,  COPD inhaler, former smoker,    breath sounds clear to auscultation- rhonchi (-) wheezing      Cardiovascular hypertension, Pt. on medications +CHF  (-) CAD, (-) Past MI, (-) Cardiac Stents and (-) CABG + pacemaker + Valvular Problems/Murmurs (s/p MVR)  Rhythm:Regular Rate:Normal - Systolic murmurs and - Diastolic murmurs Echo [id number]:  Technically difficult study due to chest wall/lung interference  Mitral valve replacment (25 mm CE pericardial bioprosthetic valve  implanted 05/2014)  Normal left ventricular systolic function, ejection fraction > 55%  Aortic sclerosis  Prosthetic mitral valve regurgitation - mild  Prosthetic mitral valve stenosis - mild  Dilated left atrium - severe  Reduced right ventricular systolic function  Dilated right atrium - mild   Neuro/Psych  Headaches, neg Seizures PSYCHIATRIC DISORDERS Anxiety Depression    GI/Hepatic Neg liver ROS, GERD  ,  Endo/Other  neg diabetesHypothyroidism   Renal/GU Renal InsufficiencyRenal disease     Musculoskeletal negative musculoskeletal ROS (+)   Abdominal (+) + obese,   Peds  Hematology  (+) anemia ,   Anesthesia Other Findings Past Medical History: No date: Anemia No date: Anxiety No date: Atrial fibrillation (HCC) No date: B12 deficiency No date: Breast cancer ([place])     Comment:  RT Lumpectomy c radiation 2012 No date: Breast cancer ([place]) No date: CHF (congestive heart failure) (HCC) No date: Chronic kidney disease No date: Cirrhosis  ([place]) No date: COPD (chronic obstructive pulmonary disease) (HCC) No date: Depression No date: GERD (gastroesophageal reflux disease) No date: Headache No date: Heart murmur No date: Hyperlipemia No date: Hyperlipidemia No date: Hypertension No date: Hypothyroidism No date: Iron (Fe) deficiency anemia No date: Personal history of radiation therapy No date: Presence of permanent cardiac pacemaker No date: Shortness of breath dyspnea No date: Thrombocytopenia ([place]) No date: Vitamin D deficiency   Reproductive/Obstetrics                             Anesthesia Physical Anesthesia Plan  ASA: III  Anesthesia Plan: General   Post-op Pain Management:    Induction: Intravenous  PONV Risk Score and Plan: 2 and Dexamethasone and Ondansetron  Airway Management Planned: LMA  Additional Equipment:   Intra-op Plan:   Post-operative Plan:   Informed Consent: I have reviewed the patients History and Physical, chart, labs and discussed the procedure including the risks, benefits and alternatives for the proposed anesthesia with the patient or authorized representative who has indicated his/her understanding and acceptance.     Dental advisory given  Plan Discussed with: CRNA and Anesthesiologist  Anesthesia Plan Comments:         Anesthesia Quick Evaluation

## 2019-08-09 NOTE — OR Nursing (Signed)
20cc emptied from JP drain just prior to d/c.

## 2019-08-09 NOTE — Anesthesia Postprocedure Evaluation (Signed)
Anesthesia Post Note  Patient: Adriana Martin  Procedure(s) Performed: MASTECTOMY WITH AXILLARY LYMPH NODE DISSECTION (Right )  Patient location during evaluation: PACU Anesthesia Type: General Level of consciousness: awake and alert and oriented Pain management: pain level controlled Vital Signs Assessment: post-procedure vital signs reviewed and stable Respiratory status: spontaneous breathing, nonlabored ventilation and respiratory function stable Cardiovascular status: blood pressure returned to baseline and stable Postop Assessment: no signs of nausea or vomiting Anesthetic complications: no     Last Vitals:  Vitals:   08/09/19 1450 08/09/19 1453  BP:  111/64  Pulse: 80 80  Resp: (!) 21 15  Temp:  36.6 C  SpO2: 99% 100%    Last Pain:  Vitals:   08/09/19 1453  TempSrc:   PainSc: 3                  Drisana Schweickert

## 2019-08-09 NOTE — Anesthesia Procedure Notes (Signed)
Procedure Name: LMA Insertion Date/Time: 08/09/2019 12:20 PM Performed by: Justus Memory, CRNA Pre-anesthesia Checklist: Patient identified, Patient being monitored, Timeout performed, Emergency Drugs available and Suction available Patient Re-evaluated:Patient Re-evaluated prior to induction Oxygen Delivery Method: Circle system utilized Preoxygenation: Pre-oxygenation with 100% oxygen Induction Type: IV induction Ventilation: Mask ventilation without difficulty LMA: LMA inserted LMA Size: 3.5 Tube type: Oral Number of attempts: 1 Placement Confirmation: positive ETCO2 and breath sounds checked- equal and bilateral Tube secured with: Tape Dental Injury: Teeth and Oropharynx as per pre-operative assessment

## 2019-08-09 NOTE — OR Nursing (Signed)
Dr. Bary Castilla in to see pt @ 1521.

## 2019-08-09 NOTE — Discharge Instructions (Signed)

## 2019-08-09 NOTE — Anesthesia Post-op Follow-up Note (Signed)
Anesthesia QCDR form completed.        

## 2019-08-09 NOTE — H&P (Signed)
Adriana Martin [id number] 27-May-1946     HPI:  73 year old woman with recurrent right breast cancer. For mastectomy and SLN biopsy.   Medications Prior to Admission  Medication Sig Dispense Refill Last Dose  . acetaminophen (TYLENOL) 500 MG tablet Take 1,000 mg by mouth every 6 (six) hours as needed.   Past Week at Unknown time  . ADVAIR DISKUS 100-50 MCG/DOSE AEPB Inhale 1 puff into the lungs 2 (two) times daily.   0 08/08/2019 at Unknown time  . albuterol (VENTOLIN HFA) 108 (90 Base) MCG/ACT inhaler Inhale 2 puffs into the lungs every 6 (six) hours as needed for wheezing or shortness of breath.   Past Month at Unknown time  . anastrozole (ARIMIDEX) 1 MG tablet Take 1 tablet (1 mg total) by mouth daily. 30 tablet 6 08/08/2019 at Unknown time  . aspirin EC 81 MG tablet Take 81 mg by mouth daily.   08/08/2019 at Unknown time  . b complex vitamins capsule Take 1 capsule by mouth daily.   08/08/2019 at Unknown time  . diltiazem (TIAZAC) 180 MG 24 hr capsule Take 180 mg by mouth daily.   08/09/2019 at 0800  . ezetimibe (ZETIA) 10 MG tablet Take 10 mg by mouth daily.   08/08/2019 at Unknown time  . ferrous sulfate 325 (65 FE) MG tablet Take 325 mg by mouth daily with breakfast.   08/08/2019 at Unknown time  . furosemide (LASIX) 40 MG tablet Take 40 mg by mouth daily.   1 08/08/2019 at Unknown time  . levothyroxine (SYNTHROID, LEVOTHROID) 50 MCG tablet Take 50 mcg by mouth daily before breakfast.   08/09/2019 at 0800  . lidocaine-prilocaine (EMLA) cream Apply to right breas areola 1 hour prior to surgery. 5 g 0 08/08/2019 at Unknown time  . lisinopril (PRINIVIL,ZESTRIL) 5 MG tablet Take 5 mg by mouth daily.   98 08/08/2019 at Unknown time  . Melatonin 3 MG TABS Take 3 mg by mouth at bedtime as needed (sleep).   08/08/2019 at Unknown time  . Multiple Vitamins-Minerals (CENTRUM SILVER ULTRA WOMENS) TABS Take 1 tablet by mouth daily.   08/08/2019 at Unknown time  . Multiple Vitamins-Minerals  (PRESERVISION AREDS 2 PO) Take 2 capsules by mouth daily.   08/08/2019 at Unknown time  . potassium chloride (K-DUR,KLOR-CON) 10 MEQ tablet Take 20 mEq by mouth 2 (two) times daily.    08/08/2019 at Unknown time  . pravastatin (PRAVACHOL) 20 MG tablet Take 20 mg by mouth at bedtime.   08/08/2019 at Unknown time  . predniSONE (DELTASONE) 5 MG tablet Take 5 mg by mouth daily with breakfast.   08/08/2019 at Unknown time  . Probiotic Product (PROBIOTIC DAILY PO) Take by mouth.   08/08/2019 at Unknown time  . senna (SENOKOT) 8.6 MG tablet Take 1 tablet by mouth daily as needed for constipation.   08/08/2019 at Unknown time  . sertraline (ZOLOFT) 100 MG tablet Take 100 mg by mouth daily.   08/09/2019 at 0800  . ursodiol (ACTIGALL) 300 MG capsule Take 300 mg by mouth 2 (two) times daily.    08/09/2019 at 0800  . Vitamin D, Ergocalciferol, (DRISDOL) 50000 units CAPS capsule Take 50,000 Units by mouth every 14 (fourteen) days.    08/08/2019 at Unknown time  . oxyCODONE (OXY IR/ROXICODONE) 5 MG immediate release tablet Take 1-2 tablets (5-10 mg total) by mouth every 3 (three) hours as needed for moderate pain (pain score 4-6). (Patient not taking: Reported on 10/24/2018) 30 tablet 0  Allergies  Allergen Reactions  . Atorvastatin Shortness Of Breath  . Calcium Shortness Of Breath  . Calcium Carbonate     Other reaction(s): Unknown  . Fluoxetine     Other reaction(s): Unknown  . Hydrochlorothiazide     Other reaction(s): Unknown   Past Medical History:  Diagnosis Date  . Anemia   . Anxiety   . Atrial fibrillation ([employer])   . B12 deficiency   . Breast cancer ([place])    RT Lumpectomy c radiation 2012  . Breast cancer ([place])   . CHF (congestive heart failure) ([place])   . Chronic kidney disease   . Cirrhosis ([place])   . COPD (chronic obstructive pulmonary disease) ([place])   . Depression   . GERD (gastroesophageal reflux disease)   . Headache   . Heart murmur   . Hyperlipemia   . Hyperlipidemia   .  Hypertension   . Hypothyroidism   . Iron (Fe) deficiency anemia   . Personal history of radiation therapy   . Presence of permanent cardiac pacemaker   . Shortness of breath dyspnea   . Thrombocytopenia ([place])   . Vitamin D deficiency    Past Surgical History:  Procedure Laterality Date  . ABDOMINAL HYSTERECTOMY     Partial  . ABLATION    . APPENDECTOMY    . BICEPT TENODESIS Right 01/07/2018   Procedure: BICEPS TENODESIS;  Surgeon: Leim Fabry, MD;  Location: [facility] ORS;  Service: Orthopedics;  Laterality: Right;  . BREAST BIOPSY Right 2012  . BREAST BIOPSY Right 07/21/2019   venus clip, [place] Bx, pending path   . BREAST LUMPECTOMY Right 2012   positive/rad  . BREAST SURGERY    . CARDIAC SURGERY    . CARDIAC VALVE REPLACEMENT    . COLONOSCOPY  2012  . ELECTROPHYSIOLOGIC STUDY N/A 01/23/2015   Procedure: CARDIOVERSION;  Surgeon: Corey Skains, MD;  Location: [facility] ORS;  Service: Cardiovascular;  Laterality: N/A;  . ELECTROPHYSIOLOGIC STUDY N/A 05/27/2016   Procedure: CARDIOVERSION;  Surgeon: Corey Skains, MD;  Location: [facility] ORS;  Service: Cardiovascular;  Laterality: N/A;  . ESOPHAGOGASTRODUODENOSCOPY  2012  . EYE SURGERY    . MITRAL VALVE REPLACEMENT    . mvp    . REVERSE SHOULDER ARTHROPLASTY Right 01/07/2018   Procedure: REVERSE SHOULDER ARTHROPLASTY;  Surgeon: Leim Fabry, MD;  Location: [facility] ORS;  Service: Orthopedics;  Laterality: Right;  Marland Kitchen VSD REPAIR     Social History   Socioeconomic History  . Marital status: Divorced    Spouse name: Not on file  . Number of children: Not on file  . Years of education: Not on file  . Highest education level: Not on file  Occupational History  . Not on file  Social Needs  . Financial resource strain: Not on file  . Food insecurity    Worry: Not on file    Inability: Not on file  . Transportation needs    Medical: Not on file    Non-medical: Not on file  Tobacco Use  . Smoking status: Former Smoker    Packs/day: 0.25     Years: 30.00    Pack years: 7.50    Types: Cigarettes    Quit date: 10/07/1998    Years since quitting: 20.8  . Smokeless tobacco: Never Used  Substance and Sexual Activity  . Alcohol use: No    Alcohol/week: 4.0 standard drinks    Types: 4 Cans of beer per week    Frequency: Never  Comment: socially  . Drug use: No  . Sexual activity: Not on file  Lifestyle  . Physical activity    Days per week: Not on file    Minutes per session: Not on file  . Stress: Not on file  Relationships  . Social Herbalist on phone: Not on file    Gets together: Not on file    Attends religious service: Not on file    Active member of club or organization: Not on file    Attends meetings of clubs or organizations: Not on file    Relationship status: Not on file  . Intimate partner violence    Fear of current or ex partner: Not on file    Emotionally abused: Not on file    Physically abused: Not on file    Forced sexual activity: Not on file  Other Topics Concern  . Not on file  Social History Narrative  . Not on file   Social History   Social History Narrative  . Not on file     ROS: Negative.     PE: HEENT: Negative. Lungs: Clear. Cardio: RR.   Assessment/Plan:  Proceed with planned right mastectomy with SLN biopsy.   Forest Gleason Veer Elamin 08/09/2019

## 2019-08-09 NOTE — Transfer of Care (Signed)
Immediate Anesthesia Transfer of Care Note  Patient: Adriana Martin  Procedure(s) Performed: MASTECTOMY WITH AXILLARY LYMPH NODE DISSECTION (Right )  Patient Location: PACU  Anesthesia Type:General  Level of Consciousness: sedated  Airway & Oxygen Therapy: Patient Spontanous Breathing and Patient connected to face mask oxygen  Post-op Assessment: Report given to RN  Post vital signs: Reviewed and stable  Last Vitals:  Vitals Value Taken Time  BP 123/69 08/09/19 1420  Temp    Pulse 81 08/09/19 1430  Resp 11 08/09/19 1430  SpO2 98 % 08/09/19 1430  Vitals shown include unvalidated device data.  Last Pain:  Vitals:   08/09/19 1420  TempSrc:   PainSc: (P) 0-No pain         Complications: No apparent anesthesia complications

## 2019-08-10 ENCOUNTER — Encounter: Payer: Self-pay | Admitting: General Surgery

## 2019-08-15 LAB — SURGICAL PATHOLOGY

## 2019-08-25 ENCOUNTER — Encounter: Payer: Self-pay | Admitting: General Surgery

## 2019-09-29 ENCOUNTER — Encounter: Payer: Self-pay | Admitting: *Deleted

## 2019-11-30 ENCOUNTER — Ambulatory Visit: Payer: Medicare Other | Attending: Internal Medicine

## 2019-11-30 DIAGNOSIS — Z23 Encounter for immunization: Secondary | ICD-10-CM

## 2019-11-30 NOTE — Progress Notes (Signed)
   Covid-19 Vaccination Clinic  Name:  Adriana Martin    MRN: [id number] DOB: 1946/06/19  11/30/2019  Adriana Martin was observed post Covid-19 immunization for 15 minutes without incident. She was provided with Vaccine Information Sheet and instruction to access the V-Safe system.   Adriana Martin was instructed to call 911 with any severe reactions post vaccine: Marland Kitchen Difficulty breathing  . Swelling of face and throat  . A fast heartbeat  . A bad rash all over body  . Dizziness and weakness   Immunizations Administered    Name Date Dose VIS Date Route   Pfizer COVID-19 Vaccine 11/30/2019  9:21 AM 0.3 mL 09/01/2019 Intramuscular   Manufacturer: [place]   Lot: [id number]   [place]: [id number]

## 2019-12-09 NOTE — Progress Notes (Signed)
[facility]  [address] [place], [place] [address] Phone: [phone]  Fax: [phone]   Clinic Day:  12/11/2019  Referring physician: [name], MD  Chief Complaint: Adriana [facility] is a 74 y.o. female with recurrent right breast cancer, JAK2+thrombocytosis,and amonoclonal gammopathyof unknown significance (MGUS) who is seen for review of pathology s/p mastectomy and discussion regarding direction of therapy.    HPI: The patient was last seen in the medical oncology clinic on 07/27/2019. At that time, she felt "fine". Exam revealed right breast changes. Hematocrit 38.3, hemoglobin 12.8, platelets 321,000, and WBC 10,100. Alkaline phosphatase was 133. M spike was 0.2 gm/dL. Kappa free light chains were 40.8 with a ratio of 1.27.  Surveillance continued.   Bone survey on 07/27/2019 showed a few scattered lucencies were noted in the calvarium, unchanged. There were no lytic myelomatous lesions identified in the remainder of the axial or appendicular skeleton. There was stable T8 and L1 compression fractures. There was a right total shoulder arthroplasty with remote appearing fracture below the prosthesis (11/2018).  At last visit, we discussed results from her ultrasound guided right breast core needle biopsy on 07/21/2019.  Pathology revealed a 12 mm grade II invasive mammary carcinoma with lobular features.  Tumor was ER + (> 90%), PR + (> 90%), and Her2/neu 1+.  She was unable to undergo breast MRI secondary to her pacemaker.  Because of recurrence in the field of radiation, we discussed mastectomy.  She underwent right mastectomy and SLN biopsy with Dr. Bary Castilla on 08/09/2019.  Pathology revealed a 4.3 x 2.1 x 2.0 cm grade II invasive lobular carcinoma. There was lobular carcinoma in situ focally present.  Margins were uninvolved by invasive carcinoma.  Closest margin was < 0.5 mm from the inferior and 1 mm from the deep margin.  There was 1 of 10  lymph nodes with metastatic carcinoma.  The largest metastatic deposit was 2 mm.  Tumor was ER was positive (>90%), PR positive (>90%), and Her2/neu 1+.   Pathologic stage was mpT2 p N1a.  Mammaprint revealed a low risk luminal-type (A).  From the MINDACT trial if lymph node negative, ER+ and Her2/neu - there was a 97.8% chance of living without distant recurrence of breast cancer at 5 years (DMFI).  She was scheduled to be seen 2 weeks s/p surgery.  She has not had follow-up until today.  She was seen by Dr Bary Castilla on 10/19/2019.  She was seen by Dr. Nehemiah Massed on 12/06/2019. Exam was stable. She has follow up in 04/06/2020.  During the interim, she has felt "pretty good". She denies any back pain. She notes some limitations and not being able to get around very well. She has shortness of breath with exertion. She denies chest pain. She has no B symptoms. She has no issues with her CKD. She has chronic right arm numbness since her fracture on 12/05/2018.   She is scheduled to see Dr. Bary Castilla on 12/12/2019. Her daughter notes that she is not on any hormonal treatment.  Patient agreed to starting hormonal treatment today.  I discussed treatment options (tamoxifen versus an aromatase inhibitor) along with benefits and risks.   We dicussed Prolia if bone density is poor.  Potential side effects reviewed.     Past Medical History:  Diagnosis Date  . Anemia   . Anxiety   . Atrial fibrillation ([place])   . B12 deficiency   . Breast cancer ([place])    RT Lumpectomy c radiation  2012  . Breast cancer ([place])   . CHF (congestive heart failure) ([place])   . Chronic kidney disease   . Cirrhosis ([place])   . COPD (chronic obstructive pulmonary disease) ([place])   . Depression   . GERD (gastroesophageal reflux disease)   . Headache   . Heart murmur   . Hyperlipemia   . Hyperlipidemia   . Hypertension   . Hypothyroidism   . Iron (Fe) deficiency anemia   . Personal history of radiation therapy   . Presence of  permanent cardiac pacemaker   . Shortness of breath dyspnea   . Thrombocytopenia ([place])   . Vitamin D deficiency     Past Surgical History:  Procedure Laterality Date  . ABDOMINAL HYSTERECTOMY     Partial  . ABLATION    . APPENDECTOMY    . BICEPT TENODESIS Right 01/07/2018   Procedure: BICEPS TENODESIS;  Surgeon: Leim Fabry, MD;  Location: [facility] ORS;  Service: Orthopedics;  Laterality: Right;  . BREAST BIOPSY Right 2012  . BREAST BIOPSY Right 07/21/2019   venus clip, [place] Bx, pending path   . BREAST LUMPECTOMY Right 2012   positive/rad  . BREAST SURGERY    . CARDIAC SURGERY    . CARDIAC VALVE REPLACEMENT    . COLONOSCOPY  2012  . ELECTROPHYSIOLOGIC STUDY N/A 01/23/2015   Procedure: CARDIOVERSION;  Surgeon: Corey Skains, MD;  Location: [facility] ORS;  Service: Cardiovascular;  Laterality: N/A;  . ELECTROPHYSIOLOGIC STUDY N/A 05/27/2016   Procedure: CARDIOVERSION;  Surgeon: Corey Skains, MD;  Location: [facility] ORS;  Service: Cardiovascular;  Laterality: N/A;  . ESOPHAGOGASTRODUODENOSCOPY  2012  . EYE SURGERY    . MASTECTOMY WITH AXILLARY LYMPH NODE DISSECTION Right 08/09/2019   Procedure: MASTECTOMY WITH AXILLARY LYMPH NODE DISSECTION;  Surgeon: Robert Bellow, MD;  Location: [facility] ORS;  Service: General;  Laterality: Right;  . MITRAL VALVE REPLACEMENT    . mvp    . REVERSE SHOULDER ARTHROPLASTY Right 01/07/2018   Procedure: REVERSE SHOULDER ARTHROPLASTY;  Surgeon: Leim Fabry, MD;  Location: [facility] OR[facility];  Service: Orthopedics;  Laterality: Right;  Marland Kitchen VSD REPAIR      Family History  Problem Relation Age of Onset  . Cancer Mother   . Breast cancer Mother   . Cancer Father   . Cancer Brother   . Cancer Daughter   . Breast cancer Daughter     Social History:  reports that she quit smoking about 21 years ago. Her smoking use included cigarettes. She has a 7.50 pack-year smoking history. She has never used smokeless tobacco. She reports that she does not drink alcohol or use drugs.  She does not drive.  She lives in [place]. The patient is accompanied by her daughter via [place] today.  Allergies:  Allergies  Allergen Reactions  . Atorvastatin Shortness Of Breath  . Calcium Shortness Of Breath  . Calcium Carbonate     Other reaction(s): Unknown  . Fluoxetine     Other reaction(s): Unknown  . Hydrochlorothiazide     Other reaction(s): Unknown    Current Medications: Current Outpatient Medications  Medication Sig Dispense Refill  . acetaminophen (TYLENOL) 500 MG tablet Take 1,000 mg by mouth every 6 (six) hours as needed.    Marland Kitchen ADVAIR DISKUS 100-50 MCG/DOSE AEPB Inhale 1 puff into the lungs 2 (two) times daily.   0  . albuterol (VENTOLIN HFA) 108 (90 Base) MCG/ACT inhaler Inhale 2 puffs into the lungs every 6 (six) hours as needed for wheezing  or shortness of breath.    . anastrozole (ARIMIDEX) 1 MG tablet Take 1 tablet (1 mg total) by mouth daily. 30 tablet 6  . aspirin EC 81 MG tablet Take 81 mg by mouth daily.    . B Complex-C-Zn-Folic Acid (DIALYVITE/ZINC) TABS Take 1 tablet by mouth daily.     Marland Kitchen diltiazem (CARDIZEM CD) 180 MG 24 hr capsule Take 180 mg by mouth daily.    Marland Kitchen ezetimibe (ZETIA) 10 MG tablet Take 10 mg by mouth daily.    . ferrous sulfate 325 (65 FE) MG tablet Take 325 mg by mouth daily with breakfast.    . furosemide (LASIX) 40 MG tablet Take 40 mg by mouth daily.   1  . HYDROcodone-acetaminophen (NORCO/VICODIN) 5-325 MG tablet Take 1 tablet by mouth every 4 (four) hours as needed for moderate pain. 20 tablet 0  . levothyroxine (SYNTHROID, LEVOTHROID) 50 MCG tablet Take 50 mcg by mouth daily before breakfast.    . lisinopril (PRINIVIL,ZESTRIL) 5 MG tablet Take 5 mg by mouth daily.   98  . Multiple Vitamins-Minerals (CENTRUM SILVER ULTRA WOMENS) TABS Take 1 tablet by mouth daily.    . Multiple Vitamins-Minerals (PRESERVISION AREDS 2 PO) Take 2 capsules by mouth daily.    . potassium chloride (K-DUR,KLOR-CON) 10 MEQ tablet Take 20 mEq by mouth 2 (two)  times daily.     . pravastatin (PRAVACHOL) 20 MG tablet Take 20 mg by mouth at bedtime.    . predniSONE (DELTASONE) 5 MG tablet Take 5 mg by mouth daily with breakfast.    . thiamine 100 MG tablet Take 100 mg by mouth daily.     . ursodiol (ACTIGALL) 300 MG capsule Take 300 mg by mouth 2 (two) times daily.     . Vitamin D, Ergocalciferol, (DRISDOL) 50000 units CAPS capsule Take 50,000 Units by mouth every 14 (fourteen) days.     . Probiotic Product (PROBIOTIC DAILY PO) Take by mouth.    . sertraline (ZOLOFT) 100 MG tablet Take 100 mg by mouth daily.     No current facility-administered medications for this visit.    Review of Systems  Constitutional: Negative.  Negative for chills, diaphoresis, fever, malaise/fatigue and weight loss (up 4 lbs).       Feels "pretty good".  HENT: Negative.  Negative for congestion, ear discharge, ear pain, hearing loss, nosebleeds, sinus pain and sore throat.        No teeth.  No gum breakdown.  Eyes: Negative.  Negative for blurred vision and double vision.  Respiratory: Positive for shortness of breath (on exertion). Negative for cough, hemoptysis and sputum production.   Cardiovascular: Negative.  Negative for chest pain, palpitations and leg swelling.  Gastrointestinal: Negative.  Negative for abdominal pain, blood in stool, constipation, diarrhea, melena, nausea and vomiting.  Genitourinary: Negative for dysuria, frequency, hematuria and urgency.       Chronic kidney disease.  Musculoskeletal: Negative for back pain, joint pain, myalgias and neck pain.       Right humoral shaft fracture on 12/05/2018.   Skin: Negative.  Negative for itching and rash.  Neurological: Positive for sensory change (right arm numbness s/p fracture). Negative for dizziness, tingling, speech change, focal weakness, weakness and headaches.  Endo/Heme/Allergies: Negative.  Does not bruise/bleed easily.  Psychiatric/Behavioral: Negative.  Negative for depression and memory loss.  The patient is not nervous/anxious and does not have insomnia.   All other systems reviewed and are negative.  Performance status (ECOG): 0  Vitals  Blood pressure 112/70, pulse 91, temperature (!) 96.1 F (35.6 C), temperature source Tympanic, resp. rate 18, weight 179 lb 2 oz (81.3 kg), SpO2 98 %.   Physical Exam  Constitutional: She is oriented to person, place, and time. She appears well-developed and well-nourished. No distress.  She needs assistance on/off exam room table. She has a cane at her side.  HENT:  Head: Normocephalic and atraumatic.  Mouth/Throat: Oropharynx is clear and moist. No oropharyngeal exudate.  Curly graying hair.  Dentures.  Mask.  Eyes: Pupils are equal, round, and reactive to light. Conjunctivae and EOM are normal. No scleral icterus.  Glasses.  Neck: No JVD present.  Cardiovascular: Normal rate, regular rhythm and normal heart sounds.  No murmur heard. Pulmonary/Chest: Effort normal and breath sounds normal. No respiratory distress. She has no wheezes. She has no rales. She exhibits no tenderness. Right breast exhibits no inverted nipple, no mass, no nipple discharge, no skin change and no tenderness. Left breast exhibits no inverted nipple, no mass, no nipple discharge, no skin change and no tenderness.  Right mastectomy without erythema or nodularity.  Minimal fibrocystic changes inferiorly left breast.  Abdominal: Soft. Bowel sounds are normal. She exhibits no distension and no mass. There is no abdominal tenderness. There is no rebound and no guarding.  Musculoskeletal:        General: No tenderness or edema. Normal range of motion.     Cervical back: Normal range of motion and neck supple.  Lymphadenopathy:       Head (right side): No preauricular, no posterior auricular and no occipital adenopathy present.       Head (left side): No preauricular, no posterior auricular and no occipital adenopathy present.    She has no cervical adenopathy.    She has  no axillary adenopathy.       Right: No inguinal and no supraclavicular adenopathy present.       Left: No inguinal and no supraclavicular adenopathy present.  Neurological: She is alert and oriented to person, place, and time.  Skin: Skin is warm and dry. No rash noted. She is not diaphoretic. No erythema. No pallor.  Psychiatric: She has a normal mood and affect. Her behavior is normal. Judgment and thought content normal.  Nursing note and vitals reviewed.   No visits with results within 3 Day(s) from this visit.  Latest known visit with results is:  Admission on 08/09/2019, Discharged on 08/09/2019  Component Date Value Ref Range Status  . SURGICAL PATHOLOGY 08/09/2019    Final-Edited                   Value:SURGICAL PATHOLOGY CASE: [id number] PATIENT: Adriana [facility] Surgical Pathology Report  Specimen Submitted: A. Breast and axillary contents, right  Clinical History: Right breast cancer.  DIAGNOSIS: A. RIGHT BREAST AND AXILLARY, MASTECTOMY WITH AXILLARY DISSECTION: - INVASIVE LOBULAR CARCINOMA. - METASTATIC CARCINOMA INVOLVING ONE OF TEN LYMPH NODES (1/10). - REFER TO CANCER SUMMARY BELOW.  CANCER CASE SUMMARY: INVASIVE CARCINOMA OF THE BREAST Procedure: Mastectomy with axillary lymph node dissection Specimen Laterality: Right Tumor Size: 43 x 21 x 20 mm (largest focus) Histologic Type: Lobular Histologic Grade (Nottingham Histologic Score)                      Glandular (Acinar)/Tubular Differentiation: 3                      Nuclear Pleomorphism: 2  Mitotic Rate: 1                      Overall Grade: 2 Lobular Carcinoma In Situ (LCIS): Focally present Margins:                      Invasive Carcinoma Margins: Uninvolved b                         y invasive carcinoma                      Distance from closest margin: Less than 0.5 mm from inferior, 1 mm from deep                      LCIS Margins: Widely negative Regional Lymph Nodes:  Involved by tumor cells                      Number of Lymph Nodes with Macrometastases: One                      Number of Lymph Nodes with Micrometastases: Zero                      Number of Lymph Nodes with Isolated Tumor Cells: Zero                      Size of Largest Metastatic Deposit: 2 mm                      Extranodal Extension: Not identified                      Total Number of Lymph Nodes Examined: Ten                      Number of Sentinel Nodes Examined: Zero  Treatment Effect in the Breast: Not applicable Lymphovascular Invasion: Not identified Pathologic Stage Classification (pTNM, AJCC 8th Edition): mpT2 pN1a TNM Descriptors: Not applicable Breast Biomarker Testing Performed on Previous Biopsy: [id number]  BREAST BIOMARKER TESTS Estrogen Receptor (ER) St                         atus: POSITIVE, >90% Progesterone Receptor (PgR) Status: POSITIVE, >90% HER2 (by immunohistochemistry): NEGATIVE (Score 1+)  Comment: Immunohistochemical stain for pancytokeratin (A23-A26) highlights tumor cells in one lymph node.  Stain for E-cadherin (A5) is negative.  IHC slides were prepared by Launa Grill, [place]. All controls stained appropriately.  This test was developed and its performance characteristics determined by [facility]. It has not been cleared or approved by the [place] Food and Drug Administration. The FDA does not require this test to go through premarket FDA review. This test is used for clinical purposes. It should not be regarded as investigational or for research. This laboratory is certified under the Clinical Laboratory Improvement Amendments (CLIA) as qualified to perform high complexity clinical laboratory testing.  GROSS DESCRIPTION: A. Labeled: Right breast and axillary contents Received: Fresh Time in fixative: Tissue procedure time 2                         :15 PM, tissue put in formalin time 2:21 PM 08/09/2019 Cold ischemic time: 11 minutes Total  fixation time: 30 hours Type of mastectomy: Simple mastectomy with axillary lymph node dissection Laterality: Right breast Weight of specimen: 547 grams Size of specimen: 18 x 17 x 7 cm Inking scheme: Deep black, superior and superficial blue, inferior green, lateral orange, medial yellow Skin ellipse dimensions  description: Grossly unremarkable white skin ellipse measuring 18.0 x 9.0 cm Nipple/ areola: Centrally located areola measuring 3.0 cm in diameter and inverted nipple measuring 1.0 x 0.8 cm  Orientation of specimen: Medial aspect of the specimen is designated by signature M on skin Axillary tail: Received separately is an irregular unoriented fibrofatty tissue fragment measuring 12 x 6 x 3.0 cm Biopsy site(s): Identified with metallic coil shaped biopsy clip Number of discrete masses: 1 Location of mass(es): Between upper and lower outer quadrants (approximately f                         rom 8 to 10 o'clock positions) and 2 cm from nipple  Size of mass(es)/biopsy site(s): 4.3 x 2.1 x 2.0 cm Description of mass(es)/biopsy site(s): Spiculated multinodular mass surrounded by fibrotic tissue and adjacent to the previous biopsy cavity Margins: 1.5 cm from skin, 1.7 cm from deep surgical margin, 1.6 cm from superior/superficial surgical margin, 3.0 cm from inferior surgical margin, 7 cm from medial and lateral surgical margins Gross involvement of skin/fascia/muscle by tumor: Not present Description of remaining breast: Yellow lobulated fatty soft breast tissue with minor fibrotic changes (less than 5% of the entire tissue) Lymph nodes: 9 lymph nodes ranging from 0.3 to 0.9 cm in greatest dimension  Block Summary: 1-trisected nipple 2-nipple base 3-4-tissue adjacent to the tumor with deep surgical margin 5-section of main tumor with biopsy clip 6-tissue between 2 small nodular areas 7-2 nodular areas with deep surgical margin 8-tumor 9-11-tumor with sur                          rounding nodules and deep surgical margin 12-tumor 13-tumor to skin 14-19-previous excision cavity with deep surgical margin 20-22-central breast retroareolar region 23-25- 9 lymph nodes 26-2 candidate for lymph nodes  Final Diagnosis performed by Betsy Pries, MD.   Electronically signed 08/15/2019 3:37:58PM The electronic signature indicates that the named Attending Pathologist has evaluated the specimen Technical component performed at [facility], [address], [place], [place] [address] Lab: [phone] Dir: Rush Farmer, MD, MMM  Professional component performed at [facility], [facility], [place], [place], [place] [address] Lab: [phone] Dir: Dellia Nims. Reuel Derby, MD    Assessment:  KEONDA DOW is a 74 y.o. female withrecurrent right breast cancer, JAK2+thrombocytosis, and amonoclonal gammopathyof unknown significance (MGUS).  She has a history of stageIrightbreast cancers/p lumpectomy and SLN biopsy on 02/18/2011.Pathologyrevealed a 1.1 cm grade IIductal carcinoma tumor. Tumor was ER+ (> 90%),PR+ (> 90%),andHer2/neunegative by FISH.Pathologic stagewas T1c pN0 (sn). She received accelerated partial breast irradiation(2012). She completedArimidexin 08/2016.   Bilateral screening mammogramon 06/22/2018 revealed no malignancy. Right unilateral breast mammogram and ultrasound on 07/12/2019 revealed a suspicious 2.2 x 0.7 x 1.5 cm irregular hypoechoic mass in the right breast at 10 o'clock, with at least 3 possible satellite lesions (each measuring 3-4 mm) extending inferior and medial. There was no right axillary lymphadenopathy. Ultrasound guided biopsy was recommended for the dominant mass in the right breast.   Ultrasound guided right breast core needle biopsy on 07/21/2019 revealed a 12 mm grade II invasive mammary carcinoma with lobular features.  Tumor is ER + (> 90%), PR + (> 90%), and Her2/neu 1+.  She underwent a  right mastectomy and SLN biopsy on 08/09/2019.  Pathology revealed a 4.3 x 2.1 x 2.0 cm grade II invasive lobular carcinoma. There was lobular carcinoma in situ focally present.  Margins were uninvolved by invasive carcinoma.  Closest margin was < 0.5 mm from the inferior and 1 mm from the deep margin.  There was 1 of 10 lymph nodes with metastatic carcinoma.  The largest metastatic deposit was 2 mm.  Tumor was ER was positive (>90%), PR positive (>90%), and Her2/neu 1+.   Pathologic stage was mpT2 p N1a.  Mammaprint revealed a low risk luminal-type (A).  From the MINDACT trial if lymph node negative, ER+ and Her2/neu - there was a 97.8% chance of living without distant recurrence of breast cancer at 5 years (DMFI).   She has hadchronicthrombocytosissince2001.JAK2 V617Fwas positive on 12/14/2017. Platelet countranged between 400,000 - 500,000. She had a bone marrowin 2001 (no report available). She was previously onhydroxyurea(discontinued in 05/2016).    She has a monoclonal gammopathyof unknown significance (MGUS).SPEPon 03/26/2019revealed a0.3 gm/dL IgG monoclonal proteinwith lambda light chain specificity. Bone surveyon 12/14/2017 revealed scattered lytic lesions throughout the calvarium concerning for metastases or myeloma.There was compression fractures at T8 and L1(new since 2017).  Bone survey on 07/27/2019 showed a few scattered lucencies were noted in the calvarium, unchanged. There were no lytic myelomatous lesions identified in the remainder of the axial or appendicular skeleton. There was stable T8 and L1 compression fractures. There was a right total shoulder arthroplasty with remote appearing fracture below the prosthesis (2020).  M-spikehas been followed (gm/dL): 0.3 on03/26/2019, 0.2 on 06/07/2018,and 0.2 on 06/06/2019.   Shehas a history of stageIIIchronic kidney disease.Creatinineis 1.27 (CrCl 38.4 ml/min) on03/22/2021.  She hasa history  ofiron deficiency anemia.Last colonoscopywasin 2012(no report available).Ferritinhas been followed: 66 on06/05/2014, 82 on 07/02/2015, 385 on09/26/2017 and 609 on06/19/2018. Iron saturation was 17% on 03/09/2017. She is not on oral iron.  Symptomatically, she feels "pretty good".  She has healed well s/p mastectomy.  She has chronic numbness in her arm s/p shoulder surgery and subsequent fall and fracture.  Plan: 1.   Labs today:  CBC with diff, CMP, CA27.29, SPEP. 2. Recurrent right breast cancer (2012) She initially had right breast cancer in 2012.   Sheunderwentlumpectomy and sentinel lymph node biopsy   She receivedaccelerated partial breast radiation and 5 years of Arimidex.  Mammogram on 06/27/2019 revealed right breast distortion.   Right unilateral breast mammogram on 07/12/2019 revealed a suspicious 2.2 x 0.7 x 1.5 cm irregular hypoechoic mass.  Ultrasound guided right breast core needle biopsy on 07/21/2019 revealed invasive mammary carcinoma with lobular features.  She underwent right mastectomy and SLN biopsy on 08/09/2019.    Pathology reviewed with patient and her daughter.   4.3 x 2.1 x 2.0 cm grade II invasive lobular carcinoma.    Margins were uninvolved by invasive carcinoma.      One of 10 lymph nodes revealed metastatic carcinoma.     Tumor was ER positive (>90%), PR positive (>90%), and Her2/neu 1+.      Pathologic stage was mpT2 p N1a.  Mammaprint revealed a low risk luminal-type (A).   Discuss endocrine therapy (tamoxifen vs aromatase inhibitors).   Potential side effects reviewed.   Information provided.  Reschedule bone density.  3. JAK2+ thrombocytosis Hematocrit 42.5. Hemoglobin 13.7. MCV 89.9. Platelets 383,000. PXT06,269. 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 Original bone marrowreportfrom 2001  is unavailable. Patient  previously on hydroxyurea.  Platelet goal is <400,000. Continue to monitor closely off hydroxyurea. 4. Monoclonalgammopathy of unknown significance(MGUS) M-spike was stable at 0.2 g/dL on 06/06/2019. Bone survey on 07/27/2019  Reviewed with patient and her daughter.   There are a few scattered stable lucencies were in the calvarium.   There are no lytic myelomatous lesions in the remainder of the axial or appendicular skeleton.    There is a stable T8 and L1 compression fractures.    There is a right total shoulder arthroplasty with remote fracture below the prosthesis (11/2018). SPEP pending today.  Continue surveillance every 6 months.. 5.   Please reschedule bone density ASAP. 6.   RTC after bone density for MD assessment and discussion regarding hormonal therapy  I discussed the assessment and treatment plan with the patient.  The patient was provided an opportunity to ask questions and all were answered.  The patient agreed with the plan and demonstrated an understanding of the instructions.  The patient was advised to call back if the symptoms worsen or if the condition fails to improve as anticipated.  I provided 25 minutes of face-to-face time during this this encounter and > 50% was spent counseling as documented under my assessment and plan. An additional 15 minutes were spent reviewing her chart (Epic and Care Everywhere) including notes, labs, and imaging studies.    Lequita Asal, MD, PhD    12/11/2019, 10:33 AM  I, Selena Batten, am acting as scribe for [employer]. Mike Gip, MD, PhD.  I, Kenedie Dirocco C. Mike Gip, MD, have reviewed the above documentation for accuracy and completeness, and I agree with the above.

## 2019-12-11 ENCOUNTER — Inpatient Hospital Stay: Payer: Medicare Other | Attending: Hematology and Oncology | Admitting: Hematology and Oncology

## 2019-12-11 ENCOUNTER — Other Ambulatory Visit: Payer: Self-pay | Admitting: Hematology and Oncology

## 2019-12-11 ENCOUNTER — Inpatient Hospital Stay: Payer: Medicare Other

## 2019-12-11 ENCOUNTER — Other Ambulatory Visit: Payer: Self-pay | Admitting: *Deleted

## 2019-12-11 ENCOUNTER — Other Ambulatory Visit: Payer: Self-pay

## 2019-12-11 VITALS — BP 112/70 | HR 91 | Temp 96.1°F | Resp 18 | Wt 179.1 lb

## 2019-12-11 DIAGNOSIS — D75839 Thrombocytosis, unspecified: Secondary | ICD-10-CM

## 2019-12-11 DIAGNOSIS — Z170 Estrogen receptor positive status [ER+]: Secondary | ICD-10-CM | POA: Diagnosis not present

## 2019-12-11 DIAGNOSIS — C50811 Malignant neoplasm of overlapping sites of right female breast: Secondary | ICD-10-CM

## 2019-12-11 DIAGNOSIS — Z79899 Other long term (current) drug therapy: Secondary | ICD-10-CM | POA: Insufficient documentation

## 2019-12-11 DIAGNOSIS — N183 Chronic kidney disease, stage 3 unspecified: Secondary | ICD-10-CM | POA: Diagnosis not present

## 2019-12-11 DIAGNOSIS — D472 Monoclonal gammopathy: Secondary | ICD-10-CM

## 2019-12-11 DIAGNOSIS — I130 Hypertensive heart and chronic kidney disease with heart failure and stage 1 through stage 4 chronic kidney disease, or unspecified chronic kidney disease: Secondary | ICD-10-CM | POA: Diagnosis not present

## 2019-12-11 DIAGNOSIS — Z923 Personal history of irradiation: Secondary | ICD-10-CM | POA: Diagnosis not present

## 2019-12-11 DIAGNOSIS — Z9011 Acquired absence of right breast and nipple: Secondary | ICD-10-CM | POA: Insufficient documentation

## 2019-12-11 DIAGNOSIS — D473 Essential (hemorrhagic) thrombocythemia: Secondary | ICD-10-CM

## 2019-12-11 DIAGNOSIS — C50911 Malignant neoplasm of unspecified site of right female breast: Secondary | ICD-10-CM | POA: Diagnosis not present

## 2019-12-11 LAB — CBC WITH DIFFERENTIAL/PLATELET
Abs Immature Granulocytes: 0.05 10*3/uL (ref 0.00–0.07)
Basophils Absolute: 0.1 10*3/uL (ref 0.0–0.1)
Basophils Relative: 1 %
Eosinophils Absolute: 0.2 10*3/uL (ref 0.0–0.5)
Eosinophils Relative: 1 %
HCT: 42.5 % (ref 36.0–46.0)
Hemoglobin: 13.7 g/dL (ref 12.0–15.0)
Immature Granulocytes: 0 %
Lymphocytes Relative: 11 %
Lymphs Abs: 1.4 10*3/uL (ref 0.7–4.0)
MCH: 29 pg (ref 26.0–34.0)
MCHC: 32.2 g/dL (ref 30.0–36.0)
MCV: 89.9 fL (ref 80.0–100.0)
Monocytes Absolute: 0.9 10*3/uL (ref 0.1–1.0)
Monocytes Relative: 7 %
Neutro Abs: 10 10*3/uL — ABNORMAL HIGH (ref 1.7–7.7)
Neutrophils Relative %: 80 %
Platelets: 383 10*3/uL (ref 150–400)
RBC: 4.73 MIL/uL (ref 3.87–5.11)
RDW: 14.2 % (ref 11.5–15.5)
WBC: 12.6 10*3/uL — ABNORMAL HIGH (ref 4.0–10.5)
nRBC: 0 % (ref 0.0–0.2)

## 2019-12-11 LAB — COMPREHENSIVE METABOLIC PANEL
ALT: 23 U/L (ref 0–44)
AST: 27 U/L (ref 15–41)
Albumin: 3.9 g/dL (ref 3.5–5.0)
Alkaline Phosphatase: 120 U/L (ref 38–126)
Anion gap: 11 (ref 5–15)
BUN: 24 mg/dL — ABNORMAL HIGH (ref 8–23)
CO2: 25 mmol/L (ref 22–32)
Calcium: 9.3 mg/dL (ref 8.9–10.3)
Chloride: 102 mmol/L (ref 98–111)
Creatinine, Ser: 1.27 mg/dL — ABNORMAL HIGH (ref 0.44–1.00)
GFR calc Af Amer: 48 mL/min — ABNORMAL LOW (ref >60)
GFR calc non Af Amer: 42 mL/min — ABNORMAL LOW (ref >60)
Glucose, Bld: 95 mg/dL (ref 70–99)
Potassium: 3.4 mmol/L — ABNORMAL LOW (ref 3.5–5.1)
Sodium: 138 mmol/L (ref 135–145)
Total Bilirubin: 0.6 mg/dL (ref 0.3–1.2)
Total Protein: 7.3 g/dL (ref 6.5–8.1)

## 2019-12-11 LAB — FERRITIN: Ferritin: 178 ng/mL (ref 11–307)

## 2019-12-11 NOTE — Patient Instructions (Signed)
Denosumab injection What is this medicine? DENOSUMAB (den oh sue mab) slows bone breakdown. Prolia is used to treat osteoporosis in women after menopause and in men, and in people who are taking corticosteroids for 6 months or more. Xgeva is used to treat a high calcium level due to cancer and to prevent bone fractures and other bone problems caused by multiple myeloma or cancer bone metastases. Xgeva is also used to treat giant cell tumor of the bone. This medicine may be used for other purposes; ask your health care provider or pharmacist if you have questions. COMMON BRAND NAME(S): Prolia, XGEVA What should I tell my health care provider before I take this medicine? They need to know if you have any of these conditions:  dental disease  having surgery or tooth extraction  infection  kidney disease  low levels of calcium or Vitamin D in the blood  malnutrition  on hemodialysis  skin conditions or sensitivity  thyroid or parathyroid disease  an unusual reaction to denosumab, other medicines, foods, dyes, or preservatives  pregnant or trying to get pregnant  breast-feeding How should I use this medicine? This medicine is for injection under the skin. It is given by a health care professional in a hospital or clinic setting. A special MedGuide will be given to you before each treatment. Be sure to read this information carefully each time. For Prolia, talk to your pediatrician regarding the use of this medicine in children. Special care may be needed. For Xgeva, talk to your pediatrician regarding the use of this medicine in children. While this drug may be prescribed for children as young as 13 years for selected conditions, precautions do apply. Overdosage: If you think you have taken too much of this medicine contact a poison control center or emergency room at once. NOTE: This medicine is only for you. Do not share this medicine with others. What if I miss a dose? It is  important not to miss your dose. Call your doctor or health care professional if you are unable to keep an appointment. What may interact with this medicine? Do not take this medicine with any of the following medications:  other medicines containing denosumab This medicine may also interact with the following medications:  medicines that lower your chance of fighting infection  steroid medicines like prednisone or cortisone This list may not describe all possible interactions. Give your health care provider a list of all the medicines, herbs, non-prescription drugs, or dietary supplements you use. Also tell them if you smoke, drink alcohol, or use illegal drugs. Some items may interact with your medicine. What should I watch for while using this medicine? Visit your doctor or health care professional for regular checks on your progress. Your doctor or health care professional may order blood tests and other tests to see how you are doing. Call your doctor or health care professional for advice if you get a fever, chills or sore throat, or other symptoms of a cold or flu. Do not treat yourself. This drug may decrease your body's ability to fight infection. Try to avoid being around people who are sick. You should make sure you get enough calcium and vitamin D while you are taking this medicine, unless your doctor tells you not to. Discuss the foods you eat and the vitamins you take with your health care professional. See your dentist regularly. Brush and floss your teeth as directed. Before you have any dental work done, tell your dentist you are   receiving this medicine. Do not become pregnant while taking this medicine or for 5 months after stopping it. Talk with your doctor or health care professional about your birth control options while taking this medicine. Women should inform their doctor if they wish to become pregnant or think they might be pregnant. There is a potential for serious side  effects to an unborn child. Talk to your health care professional or pharmacist for more information. What side effects may I notice from receiving this medicine? Side effects that you should report to your doctor or health care professional as soon as possible:  allergic reactions like skin rash, itching or hives, swelling of the face, lips, or tongue  bone pain  breathing problems  dizziness  jaw pain, especially after dental work  redness, blistering, peeling of the skin  signs and symptoms of infection like fever or chills; cough; sore throat; pain or trouble passing urine  signs of low calcium like fast heartbeat, muscle cramps or muscle pain; pain, tingling, numbness in the hands or feet; seizures  unusual bleeding or bruising  unusually weak or tired Side effects that usually do not require medical attention (report to your doctor or health care professional if they continue or are bothersome):  constipation  diarrhea  headache  joint pain  loss of appetite  muscle pain  runny nose  tiredness  upset stomach This list may not describe all possible side effects. Call your doctor for medical advice about side effects. You may report side effects to FDA at 1-800-FDA-1088. Where should I keep my medicine? This medicine is only given in a clinic, doctor's office, or other health care setting and will not be stored at home. NOTE: This sheet is a summary. It may not cover all possible information. If you have questions about this medicine, talk to your doctor, pharmacist, or health care provider.  2020 Elsevier/Gold Standard (2018-01-14 16:10:44)    Letrozole tablets What is this medicine? LETROZOLE (LET roe zole) blocks the production of estrogen. It is used to treat breast cancer. This medicine may be used for other purposes; ask your health care provider or pharmacist if you have questions. COMMON BRAND NAME(S): Femara What should I tell my health care  provider before I take this medicine? They need to know if you have any of these conditions:  high cholesterol  liver disease  osteoporosis (weak bones)  an unusual or allergic reaction to letrozole, other medicines, foods, dyes, or preservatives  pregnant or trying to get pregnant  breast-feeding How should I use this medicine? Take this medicine by mouth with a glass of water. You may take it with or without food. Follow the directions on the prescription label. Take your medicine at regular intervals. Do not take your medicine more often than directed. Do not stop taking except on your doctor's advice. Talk to your pediatrician regarding the use of this medicine in children. Special care may be needed. Overdosage: If you think you have taken too much of this medicine contact a poison control center or emergency room at once. NOTE: This medicine is only for you. Do not share this medicine with others. What if I miss a dose? If you miss a dose, take it as soon as you can. If it is almost time for your next dose, take only that dose. Do not take double or extra doses. What may interact with this medicine? Do not take this medicine with any of the following medications:  estrogens, like  hormone replacement therapy or birth control pills This medicine may also interact with the following medications:  dietary supplements such as androstenedione or DHEA  prasterone  tamoxifen This list may not describe all possible interactions. Give your health care provider a list of all the medicines, herbs, non-prescription drugs, or dietary supplements you use. Also tell them if you smoke, drink alcohol, or use illegal drugs. Some items may interact with your medicine. What should I watch for while using this medicine? Tell your doctor or healthcare professional if your symptoms do not start to get better or if they get worse. Do not become pregnant while taking this medicine or for 3 weeks after  stopping it. Women should inform their doctor if they wish to become pregnant or think they might be pregnant. There is a potential for serious side effects to an unborn child. Talk to your health care professional or pharmacist for more information. Do not breast-feed while taking this medicine or for 3 weeks after stopping it. This medicine may interfere with the ability to have a child. Talk with your doctor or health care professional if you are concerned about your fertility. Using this medicine for a long time may increase your risk of low bone mass. Talk to your doctor about bone health. You may get drowsy or dizzy. Do not drive, use machinery, or do anything that needs mental alertness until you know how this medicine affects you. Do not stand or sit up quickly, especially if you are an older patient. This reduces the risk of dizzy or fainting spells. You may need blood work done while you are taking this medicine. What side effects may I notice from receiving this medicine? Side effects that you should report to your doctor or health care professional as soon as possible:  allergic reactions like skin rash, itching, or hives  bone fracture  chest pain  signs and symptoms of a blood clot such as breathing problems; changes in vision; chest pain; severe, sudden headache; pain, swelling, warmth in the leg; trouble speaking; sudden numbness or weakness of the face, arm or leg  vaginal bleeding Side effects that usually do not require medical attention (report to your doctor or health care professional if they continue or are bothersome):  bone, back, joint, or muscle pain  dizziness  fatigue  fluid retention  headache  hot flashes, night sweats  nausea  weight gain This list may not describe all possible side effects. Call your doctor for medical advice about side effects. You may report side effects to FDA at 1-800-FDA-1088. Where should I keep my medicine? Keep out of the  reach of children. Store between 15 and 30 degrees C (59 and 86 degrees F). Throw away any unused medicine after the expiration date. NOTE: This sheet is a summary. It may not cover all possible information. If you have questions about this medicine, talk to your doctor, pharmacist, or health care provider.  2020 Elsevier/Gold Standard (2016-04-13 11:10:41) Tamoxifen oral tablet What is this medicine? TAMOXIFEN (ta MOX i fen) blocks the effects of estrogen. It is commonly used to treat breast cancer. It is also used to decrease the chance of breast cancer coming back in women who have received treatment for the disease. It may also help prevent breast cancer in women who have a high risk of developing breast cancer. This medicine may be used for other purposes; ask your health care provider or pharmacist if you have questions. COMMON BRAND NAME(S): Nolvadex What  should I tell my health care provider before I take this medicine? They need to know if you have any of these conditions:  blood clots  blood disease  cataracts or impaired eyesight  endometriosis  high calcium levels  high cholesterol  irregular menstrual cycles  liver disease  stroke  uterine fibroids  an unusual reaction to tamoxifen, other medicines, foods, dyes, or preservatives  pregnant or trying to get pregnant  breast-feeding How should I use this medicine? Take this medicine by mouth with a glass of water. Follow the directions on the prescription label. You can take it with or without food. Take your medicine at regular intervals. Do not take your medicine more often than directed. Do not stop taking except on your doctor's advice. A special MedGuide will be given to you by the pharmacist with each prescription and refill. Be sure to read this information carefully each time. Talk to your pediatrician regarding the use of this medicine in children. While this drug may be prescribed for selected conditions,  precautions do apply. Overdosage: If you think you have taken too much of this medicine contact a poison control center or emergency room at once. NOTE: This medicine is only for you. Do not share this medicine with others. What if I miss a dose? If you miss a dose, take it as soon as you can. If it is almost time for your next dose, take only that dose. Do not take double or extra doses. What may interact with this medicine? Do not take this medicine with any of the following medications:  cisapride  certain medicines for irregular heart beat like dronedarone, quinidine  certain medicines for fungal infection like fluconazole, posaconazole  pimozide  saquinavir  thioridazine This medicine may also interact with the following medications:  aminoglutethimide  anastrozole  bromocriptine  chemotherapy drugs  dofetilide  female hormones, like estrogens and birth control pills  letrozole  medroxyprogesterone  phenobarbital  rifampin  warfarin This list may not describe all possible interactions. Give your health care provider a list of all the medicines, herbs, non-prescription drugs, or dietary supplements you use. Also tell them if you smoke, drink alcohol, or use illegal drugs. Some items may interact with your medicine. What should I watch for while using this medicine? Visit your doctor or health care professional for regular checks on your progress. You will need regular pelvic exams, breast exams, and mammograms. If you are taking this medicine to reduce your risk of getting breast cancer, you should know that this medicine does not prevent all types of breast cancer. If breast cancer or other problems occur, there is no guarantee that it will be found at an early stage. Do not become pregnant while taking this medicine or for 2 months after stopping it. Women should inform their doctor if they wish to become pregnant or think they might be pregnant. There is a  potential for serious side effects to an unborn child. Talk to your health care professional or pharmacist for more information. Do not breast-feed an infant while taking this medicine or for 3 months after stopping it. This medicine may interfere with the ability to have a child. Talk with your doctor or health care professional if you are concerned about your fertility. What side effects may I notice from receiving this medicine? Side effects that you should report to your doctor or health care professional as soon as possible:  allergic reactions like skin rash, itching or hives, swelling of  the face, lips, or tongue  changes in vision  changes in your menstrual cycle  difficulty walking or talking  new breast lumps  numbness  pelvic pain or pressure  redness, blistering, peeling or loosening of the skin, including inside the mouth  signs and symptoms of a dangerous change in heartbeat or heart rhythm like chest pain, dizziness, fast or irregular heartbeat, palpitations, feeling faint or lightheaded, falls, breathing problems  sudden chest pain  swelling, pain or tenderness in your calf or leg  unusual bruising or bleeding  vaginal discharge that is bloody, brown, or rust  weakness  yellowing of the whites of the eyes or skin Side effects that usually do not require medical attention (report to your doctor or health care professional if they continue or are bothersome):  fatigue  hair loss, although uncommon and is usually mild  headache  hot flashes  impotence (in men)  nausea, vomiting (mild)  vaginal discharge (white or clear) This list may not describe all possible side effects. Call your doctor for medical advice about side effects. You may report side effects to FDA at 1-800-FDA-1088. Where should I keep my medicine? Keep out of the reach of children. Store at room temperature between 20 and 25 degrees C (68 and 77 degrees F). Protect from light. Keep  container tightly closed. Throw away any unused medicine after the expiration date. NOTE: This sheet is a summary. It may not cover all possible information. If you have questions about this medicine, talk to your doctor, pharmacist, or health care provider.  2020 Elsevier/Gold Standard (2018-08-30 11:15:31)

## 2019-12-11 NOTE — Progress Notes (Signed)
Patient here for follow up. Denies any concerns.  

## 2019-12-12 LAB — CANCER ANTIGEN 27.29: CA 27.29: 31.3 U/mL (ref 0.0–38.6)

## 2019-12-13 LAB — PROTEIN ELECTROPHORESIS, SERUM
A/G Ratio: 1.1 (ref 0.7–1.7)
Albumin ELP: 3.4 g/dL (ref 2.9–4.4)
Alpha-1-Globulin: 0.3 g/dL (ref 0.0–0.4)
Alpha-2-Globulin: 0.9 g/dL (ref 0.4–1.0)
Beta Globulin: 1.3 g/dL (ref 0.7–1.3)
Gamma Globulin: 0.7 g/dL (ref 0.4–1.8)
Globulin, Total: 3.2 g/dL (ref 2.2–3.9)
Total Protein ELP: 6.6 g/dL (ref 6.0–8.5)

## 2019-12-14 NOTE — Progress Notes (Signed)
[facility]  [address] [place], [place] [address] Phone: [phone]  Fax: [phone]   Clinic Day:  12/19/2019  Referring physician: [name], MD  Chief Complaint: Adriana Martin is a 74 y.o. female with recurrent right breast cancer, JAK2+thrombocytosis,and amonoclonal gammopathyof unknown significance (MGUS) who is seen for review of bone density and discussion regarding hormonal therapy.   HPI: The patient was last seen in the medical oncology clinic on 12/11/2019. At that time, she felt "pretty good".  She had healed well s/p mastectomy.  She had chronic numbness in her arm s/p shoulder surgery and subsequent fall and fracture. Hematocrit was 42.5, hemoglobin 13.7, platelets 383,000, WBC 12,600 (ANC 10,000).  Ferritin was 178.Creatinine 1.27. CA 27.29 was 31.3.  M spike was 0 gm/dL. We discussed endocrine therapy (tamoxifen vs aromatase inhibitors).  Baseline bone density was rescheduled.   Bone density on 12/18/2019 revealed osteopenia with a T-score of -2.1 in the right femoral neck.   During the interim, she has been doing "ok".  She continues to have right arm numbness s/p fracture and shortness of breath on exertion. She had no new complaints.  She has not decided what endocrine therapy she would like to pursue.  While in clinic, she and her niece discussed treatment.  She agreed to try tamoxifen. I noted that I would contact her PCP, Dr. Raechel Martin, about her medications (interaction with Zoloft).   Patient will have her second dose of COVID-19 vaccine at the beginning of 12/2019. She had her first COVID-19 vaccine 2 weeks ago.    Past Medical History:  Diagnosis Date  . Anemia   . Anxiety   . Atrial fibrillation ([place])   . B12 deficiency   . Breast cancer ([place])    RT Lumpectomy c radiation 2012  . Breast cancer ([place])   . CHF (congestive heart failure) ([place])   . Chronic kidney disease   . Cirrhosis ([place])   . COPD (chronic  obstructive pulmonary disease) ([place])   . Depression   . GERD (gastroesophageal reflux disease)   . Headache   . Heart murmur   . Hyperlipemia   . Hyperlipidemia   . Hypertension   . Hypothyroidism   . Iron (Fe) deficiency anemia   . Personal history of radiation therapy   . Presence of permanent cardiac pacemaker   . Shortness of breath dyspnea   . Thrombocytopenia ([place])   . Vitamin D deficiency     Past Surgical History:  Procedure Laterality Date  . ABDOMINAL HYSTERECTOMY     Partial  . ABLATION    . APPENDECTOMY    . BICEPT TENODESIS Right 01/07/2018   Procedure: BICEPS TENODESIS;  Surgeon: Leim Fabry, MD;  Location: [facility] ORS;  Service: Orthopedics;  Laterality: Right;  . BREAST BIOPSY Right 2012  . BREAST BIOPSY Right 07/21/2019   venus clip, [place] Bx, pending path   . BREAST LUMPECTOMY Right 2012   positive/rad  . BREAST SURGERY    . CARDIAC SURGERY    . CARDIAC VALVE REPLACEMENT    . COLONOSCOPY  2012  . ELECTROPHYSIOLOGIC STUDY N/A 01/23/2015   Procedure: CARDIOVERSION;  Surgeon: Corey Skains, MD;  Location: [facility] ORS;  Service: Cardiovascular;  Laterality: N/A;  . ELECTROPHYSIOLOGIC STUDY N/A 05/27/2016   Procedure: CARDIOVERSION;  Surgeon: Corey Skains, MD;  Location: [facility] ORS;  Service: Cardiovascular;  Laterality: N/A;  . ESOPHAGOGASTRODUODENOSCOPY  2012  . EYE SURGERY    . MASTECTOMY WITH AXILLARY  LYMPH NODE DISSECTION Right 08/09/2019   Procedure: MASTECTOMY WITH AXILLARY LYMPH NODE DISSECTION;  Surgeon: Robert Bellow, MD;  Location: [facility] ORS;  Service: General;  Laterality: Right;  . MITRAL VALVE REPLACEMENT    . mvp    . REVERSE SHOULDER ARTHROPLASTY Right 01/07/2018   Procedure: REVERSE SHOULDER ARTHROPLASTY;  Surgeon: Leim Fabry, MD;  Location: [facility] ORS;  Service: Orthopedics;  Laterality: Right;  Marland Kitchen VSD REPAIR      Family History  Problem Relation Age of Onset  . Cancer Mother   . Breast cancer Mother   . Cancer Father   . Cancer Brother     . Cancer Daughter   . Breast cancer Daughter     Social History:  reports that she quit smoking about 21 years ago. Her smoking use included cigarettes. She has a 7.50 pack-year smoking history. She has never used smokeless tobacco. She reports that she does not drink alcohol or use drugs. She lives in [place]. The patient is accompanied by her niece, Adriana Martin, via ipad today.  Allergies:  Allergies  Allergen Reactions  . Atorvastatin Shortness Of Breath  . Calcium Shortness Of Breath  . Calcium Carbonate     Other reaction(s): Unknown  . Fluoxetine     Other reaction(s): Unknown  . Hydrochlorothiazide     Other reaction(s): Unknown    Current Medications: Current Outpatient Medications  Medication Sig Dispense Refill  . acetaminophen (TYLENOL) 500 MG tablet Take 1,000 mg by mouth every 6 (six) hours as needed.    Marland Kitchen ADVAIR DISKUS 100-50 MCG/DOSE AEPB Inhale 1 puff into the lungs 2 (two) times daily.   0  . albuterol (VENTOLIN HFA) 108 (90 Base) MCG/ACT inhaler Inhale 2 puffs into the lungs every 6 (six) hours as needed for wheezing or shortness of breath.    . anastrozole (ARIMIDEX) 1 MG tablet Take 1 tablet (1 mg total) by mouth daily. 30 tablet 6  . aspirin EC 81 MG tablet Take 81 mg by mouth daily.    . B Complex-C-Zn-Folic Acid (DIALYVITE/ZINC) TABS Take 1 tablet by mouth daily.     Marland Kitchen diltiazem (CARDIZEM CD) 180 MG 24 hr capsule Take 180 mg by mouth daily.    Marland Kitchen ezetimibe (ZETIA) 10 MG tablet Take 10 mg by mouth daily.    . ferrous sulfate 325 (65 FE) MG tablet Take 325 mg by mouth daily with breakfast.    . furosemide (LASIX) 40 MG tablet Take 40 mg by mouth daily.   1  . HYDROcodone-acetaminophen (NORCO/VICODIN) 5-325 MG tablet Take 1 tablet by mouth every 4 (four) hours as needed for moderate pain. 20 tablet 0  . levothyroxine (SYNTHROID, LEVOTHROID) 50 MCG tablet Take 50 mcg by mouth daily before breakfast.    . lisinopril (PRINIVIL,ZESTRIL) 5 MG tablet Take 5 mg by mouth  daily.   98  . Multiple Vitamins-Minerals (CENTRUM SILVER ULTRA WOMENS) TABS Take 1 tablet by mouth daily.    . Multiple Vitamins-Minerals (PRESERVISION AREDS 2 PO) Take 2 capsules by mouth daily.    . potassium chloride (K-DUR,KLOR-CON) 10 MEQ tablet Take 20 mEq by mouth 2 (two) times daily.     . pravastatin (PRAVACHOL) 20 MG tablet Take 20 mg by mouth at bedtime.    . predniSONE (DELTASONE) 5 MG tablet Take 5 mg by mouth daily with breakfast.    . Probiotic Product (PROBIOTIC DAILY PO) Take by mouth.    . sertraline (ZOLOFT) 100 MG tablet Take 100 mg by mouth  daily.    . ursodiol (ACTIGALL) 300 MG capsule Take 300 mg by mouth 2 (two) times daily.     . Vitamin D, Ergocalciferol, (DRISDOL) 50000 units CAPS capsule Take 50,000 Units by mouth every 14 (fourteen) days.      No current facility-administered medications for this visit.    Review of Systems  Constitutional: Negative.  Negative for chills, diaphoresis, fever, malaise/fatigue and weight loss (up 2 lbs).       Feels "ok".  HENT: Negative.  Negative for congestion, ear discharge, ear pain, hearing loss, nosebleeds, sinus pain and sore throat.        No teeth. Dentures. No gum breakdown.  Eyes: Negative.  Negative for blurred vision and double vision.  Respiratory: Positive for shortness of breath (on exertion). Negative for cough, hemoptysis and sputum production.   Cardiovascular: Negative.  Negative for chest pain, palpitations and leg swelling.  Gastrointestinal: Negative.  Negative for abdominal pain, blood in stool, constipation, diarrhea, melena, nausea and vomiting.  Genitourinary: Negative for dysuria, frequency, hematuria and urgency.       Chronic kidney disease.  Musculoskeletal: Negative for back pain, joint pain, myalgias and neck pain.       Right humoral shaft fracture on 12/05/2018.   Skin: Negative.  Negative for itching and rash.  Neurological: Positive for sensory change (right arm numbness s/p fracture).  Negative for dizziness, tingling, speech change, focal weakness, weakness and headaches.  Endo/Heme/Allergies: Negative.  Does not bruise/bleed easily.  Psychiatric/Behavioral: Negative.  Negative for depression and memory loss. The patient is not nervous/anxious and does not have insomnia.   All other systems reviewed and are negative.  Performance status (ECOG): 0-1  Vitals Blood pressure (!) 142/81, pulse 100, temperature (!) 96.2 F (35.7 C), temperature source Tympanic, resp. rate 19, weight 181 lb (82.1 kg), SpO2 97 %.   Physical Exam  Constitutional: She is oriented to person, place, and time. She appears well-developed and well-nourished. No distress.  She has a cane by her side.  HENT:  Head: Normocephalic and atraumatic.  Curly graying hair.  Mask.  Eyes: Conjunctivae and EOM are normal. No scleral icterus.  Glasses.  Blue eyes.  Neurological: She is alert and oriented to person, place, and time.  Skin: She is not diaphoretic.  Psychiatric: She has a normal mood and affect. Her behavior is normal. Judgment and thought content normal.  Nursing note and vitals reviewed.   No visits with results within 3 Day(s) from this visit.  Latest known visit with results is:  Appointment on 12/11/2019  Component Date Value Ref Range Status  . Total Protein ELP 12/11/2019 6.6  6.0 - 8.5 g/dL Final  . Albumin ELP 12/11/2019 3.4  2.9 - 4.4 g/dL Final  . Alpha-1-Globulin 12/11/2019 0.3  0.0 - 0.4 g/dL Final  . Alpha-2-Globulin 12/11/2019 0.9  0.4 - 1.0 g/dL Final  . Beta Globulin 12/11/2019 1.3  0.7 - 1.3 g/dL Final  . Gamma Globulin 12/11/2019 0.7  0.4 - 1.8 g/dL Final  . M-Spike, % 12/11/2019 Not Observed  Not Observed g/dL Final  . SPE Interp. 12/11/2019 Comment   Final   Comment: (NOTE) The SPE pattern appears unremarkable. Evidence of monoclonal protein is not apparent. Performed At: [facility] [place], [place] [id number] Rush Farmer MD  NB:[id number]   . Comment 12/11/2019 Comment   Final   Comment: (NOTE) Protein electrophoresis scan will follow via computer, mail, or courier delivery.   . Globulin, Total 12/11/2019  3.2  2.2 - 3.9 g/dL Corrected  . A/G Ratio 12/11/2019 1.1  0.7 - 1.7 Corrected  . Ferritin 12/11/2019 178  11 - 307 ng/mL Final   Performed at [facility], [address] [place], [place] [address]  . CA 27.29 12/11/2019 31.3  0.0 - 38.6 U/mL Final   Comment: (NOTE) Siemens Centaur Immunochemiluminometric Methodology [facility]) Values obtained with different assay methods or kits cannot be used interchangeably. Results cannot be interpreted as absolute evidence of the presence or absence of malignant disease. Performed At: [facility] [place], [place] [id number] Rush Farmer MD [id number]   . Sodium 12/11/2019 138  135 - 145 mmol/L Final  . Potassium 12/11/2019 3.4* 3.5 - 5.1 mmol/L Final  . Chloride 12/11/2019 102  98 - 111 mmol/L Final  . CO2 12/11/2019 25  22 - 32 mmol/L Final  . Glucose, Bld 12/11/2019 95  70 - 99 mg/dL Final   Glucose reference range applies only to samples taken after fasting for at least 8 hours.  . BUN 12/11/2019 24* 8 - 23 mg/dL Final  . Creatinine, Ser 12/11/2019 1.27* 0.44 - 1.00 mg/dL Final  . Calcium 12/11/2019 9.3  8.9 - 10.3 mg/dL Final  . Total Protein 12/11/2019 7.3  6.5 - 8.1 g/dL Final  . Albumin 12/11/2019 3.9  3.5 - 5.0 g/dL Final  . AST 12/11/2019 27  15 - 41 U/L Final  . ALT 12/11/2019 23  0 - 44 U/L Final  . Alkaline Phosphatase 12/11/2019 120  38 - 126 U/L Final  . Total Bilirubin 12/11/2019 0.6  0.3 - 1.2 mg/dL Final  . GFR calc non Af Amer 12/11/2019 42* >60 mL/min Final  . GFR calc Af Amer 12/11/2019 48* >60 mL/min Final  . Anion gap 12/11/2019 11  5 - 15 Final   Performed at [facility], [address], [place], [place] [address]  . WBC 12/11/2019 12.6* 4.0 - 10.5 K/uL Final  . RBC 12/11/2019 4.73   3.87 - 5.11 MIL/uL Final  . Hemoglobin 12/11/2019 13.7  12.0 - 15.0 g/dL Final  . HCT 12/11/2019 42.5  36.0 - 46.0 % Final  . MCV 12/11/2019 89.9  80.0 - 100.0 fL Final  . MCH 12/11/2019 29.0  26.0 - 34.0 pg Final  . MCHC 12/11/2019 32.2  30.0 - 36.0 g/dL Final  . RDW 12/11/2019 14.2  11.5 - 15.5 % Final  . Platelets 12/11/2019 383  150 - 400 K/uL Final  . nRBC 12/11/2019 0.0  0.0 - 0.2 % Final  . Neutrophils Relative % 12/11/2019 80  % Final  . Neutro Abs 12/11/2019 10.0* 1.7 - 7.7 K/uL Final  . Lymphocytes Relative 12/11/2019 11  % Final  . Lymphs Abs 12/11/2019 1.4  0.7 - 4.0 K/uL Final  . Monocytes Relative 12/11/2019 7  % Final  . Monocytes Absolute 12/11/2019 0.9  0.1 - 1.0 K/uL Final  . Eosinophils Relative 12/11/2019 1  % Final  . Eosinophils Absolute 12/11/2019 0.2  0.0 - 0.5 K/uL Final  . Basophils Relative 12/11/2019 1  % Final  . Basophils Absolute 12/11/2019 0.1  0.0 - 0.1 K/uL Final  . Immature Granulocytes 12/11/2019 0  % Final  . Abs Immature Granulocytes 12/11/2019 0.05  0.00 - 0.07 K/uL Final   Performed at [facility], [address], [place], [place] [address]    Assessment:  CASSANDRA HARBOLD is a 74 y.o. female withrecurrent right breast cancer, JAK2+thrombocytosis, and  amonoclonal gammopathyof unknown significance (MGUS).  She has a history of stageIrightbreast cancers/p lumpectomy and SLN biopsy on 02/18/2011.Pathologyrevealed a 1.1 cm grade IIductal carcinoma tumor. Tumor was ER+ (>90%),PR+ (>90%),andHer2/neunegative by FISH.Pathologic stagewas T1c pN0 (sn). She received accelerated partial breast irradiation(2012). She completedArimidexin 08/2016.   Bilateralscreeningmammogramon 06/22/2018 revealed no malignancy.Right unilateral breast mammogram and ultrasoundon 07/12/2019 revealed a suspicious 2.2 x 0.7 x 1.5 cm irregular hypoechoicmass in the right breast at 10 o'clock, with at least 3 possible satellite  lesions(each measuring 3-4 mm)extending inferior and medial. There was no right axillary lymphadenopathy. Ultrasound guided biopsy was recommended for the dominant mass in the right breast.   Ultrasound guidedright breast core needle biopsyon 07/21/2019 revealed a 12 mm grade II invasive mammary carcinomawith lobular features.Tumor is ER + (> 90%), PR + (> 90%), and Her2/neu 1+.  She underwent a right mastectomy and SLN biopsy on 08/09/2019.  Pathology revealed a 4.3 x 2.1 x 2.0 cm grade II invasive lobular carcinoma. There was lobular carcinoma in situ focally present.  Margins were uninvolved by invasive carcinoma.  Closest margin was < 0.5 mm from the inferior and 1 mm from the deep margin.  There was 1 of 10 lymph nodes with metastatic carcinoma.  The largest metastatic deposit was 2 mm.  Tumor was ER was positive (>90%), PR positive (>90%), and Her2/neu 1+.   Pathologic stage was mpT2 p N1a.  Mammaprint revealed a low risk luminal-type (A).  From the MINDACT trial if lymph node negative, ER+ and Her2/neu - there was a 97.8% chance of living without distant recurrence of breast cancer at 5 years (DMFI).   She has hadchronicthrombocytosissince2001.JAK2 V617Fwas positive on 12/14/2017. Platelet countranged between 400,000 - 500,000. She had a bone marrowin 2001 (no report available). She was previously onhydroxyurea(discontinued in 05/2016).    She has a monoclonal gammopathyof unknown significance (MGUS).SPEPon 03/26/2019revealed a0.3 gm/dL IgG monoclonal proteinwith lambda light chain specificity. Bone surveyon 12/14/2017 revealed scattered lytic lesions throughout the calvarium concerning for metastases or myeloma.There was compression fractures at T8 and L1(new since 2017).  Bone survey on 07/27/2019 showed a few scattered lucencies were noted in the calvarium, unchanged. There were no lytic myelomatous lesions identified in the remainder of the axial or  appendicular skeleton. There was stable T8 and L1 compression fractures. There was a right total shoulder arthroplasty with remote appearing fracture below the prosthesis (2020).  M-spikehas been followed (gm/dL): 0.3 on03/26/2019, 0.2 on 06/07/2018,and 0.2 on 06/06/2019.   Shehas stageIIIchronic kidney disease.Creatinineis 1.27 (CrCl 38.4 ml/min) on03/22/2021.  She hasa history ofiron deficiency anemia.Last colonoscopywasin 2012(no report available).Ferritinhas been followed: 66 on06/05/2014, 82 on 07/02/2015, 385 on09/26/2017 and 609 on06/19/2018. Iron saturation was 17% on 03/09/2017. She is not on oral iron.  Bone density on 12/18/2019 revealed osteopenia with a T-score of -2.1 in the right femoral neck.   Symptomatically, she denies any new concerns.  She is interested in trying tamoxifen.  Plan: 1.   Recurrent right breast cancer(2012) She initially had right breast cancer in 2012.                         Sheunderwentlumpectomy and sentinel lymph node biopsy              She receivedaccelerated partial breast radiation and 5 years of Arimidex.             Mammogram on 06/27/2019 revealed right breast distortion.  Right unilateral breast mammogram on 07/12/2019 revealed a suspicious 2.2 x 0.7 x 1.5 cm irregular hypoechoicmass.             Ultrasound guidedright breast core needle biopsyon 07/21/2019 revealed invasive mammary carcinomawith lobular features.            She underwent right mastectomy and SLN biopsy on 08/09/2019.               Pathology revealed a 4.3 x 2.1 x 2.0 cm grade II invasive lobular carcinoma.                             One of 10 lymph nodes revealed metastatic carcinoma.                           Tumor was ER positive (>90%), PR positive (>90%), and Her2/neu 1+.                            Pathologic stage was mpT2 p N1a.             Mammaprint revealed a low risk luminal-type  (A).              Discuss endocrine therapy (tamoxifen vs aromatase inhibitors).   Side effects reviewed.   After some discussion with the patient and her niece, she has decided to try tamoxifen.   Rx: tamoxifen 20 mg a day (prescription held- see below).         2. Osteopenia  Bone density on 12/18/2019 confirm osteopenia.  Discuss calcium and vitamin D.  Discuss consideration of a bisphosphonate or RANK-ligand (Prolia).   Potential side effects reviewed.   Discuss potential osteonecrosis of the jaw and dental clearance. 3.   JAK2+ thrombocytosis Hematocrit42.5. Hemoglobin13.7. MCV89.9. Platelets383,000. TJQ30,092 on 12/11/2019. 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 Original bone marrowreportfrom 2001is unavailable. Patient previously on hydroxyurea.  Platelet goalis<400,000. Patient being closely monitored off hydroxyurea. 4. Monoclonalgammopathy of unknown significance(MGUS) M-spikewas 0.2 g/dL on 06/06/2019 and 0 on 12/11/2019. Bone survey on 07/27/2019 revealed:                         There are a few scattered stable lucencies were in the calvarium.                         Stable T8 and L1 compression fractures.                          Right total shoulder arthroplasty with remote fracture below the prosthesis (11/2018). Continue surveillance every 6 months. 5.   Contact Dr Adriana Martin secondary to interaction with Zoloft. 6.   Hold initiation tamoxifen today secondary to interaction with Zoloft (makes tamoxifen less effective). 7.   RTC in 5 weeks for MD assessment and labs (LFTs).  Addendum:  When writing her prescription for tamoxifen, a warning popped up on Epic with interaction between tamoxifen and sertraline (Zoloft). Pharmacologic effects of Tamoxifen may be decreased by CYP2D6 Inhibitors. Coadministration of  CYP2D6 Inhibitors with tamoxifen may increase the risk of breast cancer recurrence.  I discussed the assessment and treatment plan with the patient.  The patient was provided an opportunity to ask questions and all were answered.  The patient agreed with the plan and  demonstrated an understanding of the instructions.  The patient was advised to call back if the symptoms worsen or if the condition fails to improve as anticipated.  I provided 20 minutes of face-to-face time during this this encounter and > 50% was spent counseling as documented under my assessment and plan.    Lequita Asal, MD, PhD    12/19/2019, 9:44 AM  I, Selena Batten, am acting as scribe for [employer]. Mike Gip, MD, PhD.  I,  C. Mike Gip, MD, have reviewed the above documentation for accuracy and completeness, and I agree with the above.

## 2019-12-18 ENCOUNTER — Other Ambulatory Visit: Payer: Self-pay

## 2019-12-18 ENCOUNTER — Ambulatory Visit
Admission: RE | Admit: 2019-12-18 | Discharge: 2019-12-18 | Disposition: A | Discharge status: Home / Self Care | Payer: Medicare Other | Source: Ambulatory Visit | Attending: Hematology and Oncology | Admitting: Hematology and Oncology

## 2019-12-18 ENCOUNTER — Encounter: Payer: Self-pay | Admitting: Hematology and Oncology

## 2019-12-18 DIAGNOSIS — M85851 Other specified disorders of bone density and structure, right thigh: Secondary | ICD-10-CM | POA: Diagnosis not present

## 2019-12-18 DIAGNOSIS — C50811 Malignant neoplasm of overlapping sites of right female breast: Secondary | ICD-10-CM | POA: Insufficient documentation

## 2019-12-18 DIAGNOSIS — Z170 Estrogen receptor positive status [ER+]: Secondary | ICD-10-CM | POA: Insufficient documentation

## 2019-12-18 NOTE — Progress Notes (Signed)
No new changes noted today. The patient name and DOB has been verified by phone today. 

## 2019-12-19 ENCOUNTER — Inpatient Hospital Stay (HOSPITAL_BASED_OUTPATIENT_CLINIC_OR_DEPARTMENT_OTHER): Payer: Medicare Other | Admitting: Hematology and Oncology

## 2019-12-19 ENCOUNTER — Encounter: Payer: Self-pay | Admitting: Hematology and Oncology

## 2019-12-19 VITALS — BP 142/81 | HR 100 | Temp 96.2°F | Resp 19 | Wt 181.0 lb

## 2019-12-19 DIAGNOSIS — D473 Essential (hemorrhagic) thrombocythemia: Secondary | ICD-10-CM | POA: Diagnosis not present

## 2019-12-19 DIAGNOSIS — C50811 Malignant neoplasm of overlapping sites of right female breast: Secondary | ICD-10-CM

## 2019-12-19 DIAGNOSIS — D75839 Thrombocytosis, unspecified: Secondary | ICD-10-CM

## 2019-12-19 DIAGNOSIS — C50911 Malignant neoplasm of unspecified site of right female breast: Secondary | ICD-10-CM | POA: Diagnosis not present

## 2019-12-19 DIAGNOSIS — D472 Monoclonal gammopathy: Secondary | ICD-10-CM | POA: Diagnosis not present

## 2019-12-19 DIAGNOSIS — Z170 Estrogen receptor positive status [ER+]: Secondary | ICD-10-CM

## 2019-12-19 NOTE — Patient Instructions (Signed)
Tamoxifen oral tablet What is this medicine? TAMOXIFEN (ta MOX i fen) blocks the effects of estrogen. It is commonly used to treat breast cancer. It is also used to decrease the chance of breast cancer coming back in women who have received treatment for the disease. It may also help prevent breast cancer in women who have a high risk of developing breast cancer. This medicine may be used for other purposes; ask your health care provider or pharmacist if you have questions. COMMON BRAND NAME(S): Nolvadex What should I tell my health care provider before I take this medicine? They need to know if you have any of these conditions:  blood clots  blood disease  cataracts or impaired eyesight  endometriosis  high calcium levels  high cholesterol  irregular menstrual cycles  liver disease  stroke  uterine fibroids  an unusual reaction to tamoxifen, other medicines, foods, dyes, or preservatives  pregnant or trying to get pregnant  breast-feeding How should I use this medicine? Take this medicine by mouth with a glass of water. Follow the directions on the prescription label. You can take it with or without food. Take your medicine at regular intervals. Do not take your medicine more often than directed. Do not stop taking except on your doctor's advice. A special MedGuide will be given to you by the pharmacist with each prescription and refill. Be sure to read this information carefully each time. Talk to your pediatrician regarding the use of this medicine in children. While this drug may be prescribed for selected conditions, precautions do apply. Overdosage: If you think you have taken too much of this medicine contact a poison control center or emergency room at once. NOTE: This medicine is only for you. Do not share this medicine with others. What if I miss a dose? If you miss a dose, take it as soon as you can. If it is almost time for your next dose, take only that dose. Do  not take double or extra doses. What may interact with this medicine? Do not take this medicine with any of the following medications:  cisapride  certain medicines for irregular heart beat like dronedarone, quinidine  certain medicines for fungal infection like fluconazole, posaconazole  pimozide  saquinavir  thioridazine This medicine may also interact with the following medications:  aminoglutethimide  anastrozole  bromocriptine  chemotherapy drugs  dofetilide  female hormones, like estrogens and birth control pills  letrozole  medroxyprogesterone  phenobarbital  rifampin  warfarin This list may not describe all possible interactions. Give your health care provider a list of all the medicines, herbs, non-prescription drugs, or dietary supplements you use. Also tell them if you smoke, drink alcohol, or use illegal drugs. Some items may interact with your medicine. What should I watch for while using this medicine? Visit your doctor or health care professional for regular checks on your progress. You will need regular pelvic exams, breast exams, and mammograms. If you are taking this medicine to reduce your risk of getting breast cancer, you should know that this medicine does not prevent all types of breast cancer. If breast cancer or other problems occur, there is no guarantee that it will be found at an early stage. Do not become pregnant while taking this medicine or for 2 months after stopping it. Women should inform their doctor if they wish to become pregnant or think they might be pregnant. There is a potential for serious side effects to an unborn child. Talk to your   health care professional or pharmacist for more information. Do not breast-feed an infant while taking this medicine or for 3 months after stopping it. This medicine may interfere with the ability to have a child. Talk with your doctor or health care professional if you are concerned about your  fertility. What side effects may I notice from receiving this medicine? Side effects that you should report to your doctor or health care professional as soon as possible:  allergic reactions like skin rash, itching or hives, swelling of the face, lips, or tongue  changes in vision  changes in your menstrual cycle  difficulty walking or talking  new breast lumps  numbness  pelvic pain or pressure  redness, blistering, peeling or loosening of the skin, including inside the mouth  signs and symptoms of a dangerous change in heartbeat or heart rhythm like chest pain, dizziness, fast or irregular heartbeat, palpitations, feeling faint or lightheaded, falls, breathing problems  sudden chest pain  swelling, pain or tenderness in your calf or leg  unusual bruising or bleeding  vaginal discharge that is bloody, brown, or rust  weakness  yellowing of the whites of the eyes or skin Side effects that usually do not require medical attention (report to your doctor or health care professional if they continue or are bothersome):  fatigue  hair loss, although uncommon and is usually mild  headache  hot flashes  impotence (in men)  nausea, vomiting (mild)  vaginal discharge (white or clear) This list may not describe all possible side effects. Call your doctor for medical advice about side effects. You may report side effects to FDA at 1-800-FDA-1088. Where should I keep my medicine? Keep out of the reach of children. Store at room temperature between 20 and 25 degrees C (68 and 77 degrees F). Protect from light. Keep container tightly closed. Throw away any unused medicine after the expiration date. NOTE: This sheet is a summary. It may not cover all possible information. If you have questions about this medicine, talk to your doctor, pharmacist, or health care provider.  2020 Elsevier/Gold Standard (2018-08-30 11:15:31)  

## 2019-12-26 ENCOUNTER — Ambulatory Visit: Payer: Medicare Other | Attending: Internal Medicine

## 2019-12-26 DIAGNOSIS — Z23 Encounter for immunization: Secondary | ICD-10-CM

## 2019-12-26 NOTE — Progress Notes (Signed)
   Covid-19 Vaccination Clinic  Name:  Adriana Martin    MRN: [id number] DOB: 22-Oct-1945  12/26/2019  [name] was observed post Covid-19 immunization for 30 minutes based on pre-vaccination screening without incident. She was provided with Vaccine Information Sheet and instruction to access the V-Safe system.   Adriana Martin was instructed to call 911 with any severe reactions post vaccine: Marland Kitchen Difficulty breathing  . Swelling of face and throat  . A fast heartbeat  . A bad rash all over body  . Dizziness and weakness   Immunizations Administered    Name Date Dose VIS Date Route   Pfizer COVID-19 Vaccine 12/26/2019  9:50 AM 0.3 mL 09/01/2019 Intramuscular   Manufacturer: [place]   Lot: [id number]   [place]: [id number]

## 2019-12-28 ENCOUNTER — Telehealth: Payer: Self-pay | Admitting: *Deleted

## 2019-12-28 ENCOUNTER — Telehealth: Payer: Self-pay

## 2019-12-28 NOTE — Telephone Encounter (Signed)
Spoke with the patient to inform her per Dr Mike Gip the patient medication has a interaction between Zoloft and Tamoxifen and she will need to speak with her PCP before moving forward to see if they can changed the Zoloft to a different medication. The patient was understanding and agreeable.

## 2019-12-28 NOTE — Telephone Encounter (Signed)
Daughter called reporting that a medicine was to be sent in to pharmacy for patient and nothing has been sent. Please advise

## 2019-12-29 ENCOUNTER — Telehealth: Payer: Self-pay

## 2019-12-29 NOTE — Telephone Encounter (Signed)
Spoke with the patient PCP office, per Dr Mike Gip she would like to start the patient on Tamoxifen but there is a interaction between Tamoxifen and Zoloft. I have informed the nurse if possible, Per Dr Mike Gip can she be changed to a different medication so that Dr Mike Gip can start the Tamoxifen. She informed me that she will send a message to the provider and I will follow up with the PCP office on Monday to see if the Zoloft can be changed to a different medication.

## 2019-12-29 NOTE — Telephone Encounter (Signed)
Spoke with the nurse from the patient PCP office and they have changed the Zolfot to Lexapro 20 mg daily. The patient has been informed.

## 2020-01-01 ENCOUNTER — Telehealth: Payer: Self-pay | Admitting: Hematology and Oncology

## 2020-01-01 ENCOUNTER — Other Ambulatory Visit: Payer: Self-pay | Admitting: Hematology and Oncology

## 2020-01-01 DIAGNOSIS — C50811 Malignant neoplasm of overlapping sites of right female breast: Secondary | ICD-10-CM

## 2020-01-01 NOTE — Telephone Encounter (Signed)
Re:  Tamoxifen and Tumor board discussions  I spoke to the patient and her daughter today.  Tamoxifen interacts with Zoloft, making tamoxifen less effective.  A message was recived that her Zoloft was going to be switched to Lexapro.  There can be an increased risk of QT prolongation especially in women, older patients, patients with hypokalemia and heart disease.  Discuss consideration of Effexor.  Dr Raechel Ache office will be contacted tomorrow.  Tumor board discussion were reviewed.  Dr Baruch Gouty asked that she be seen in the radiation oncology clinic.  We also discussed genetics testing and referral.   Adriana Asal, MD

## 2020-01-02 ENCOUNTER — Telehealth: Payer: Self-pay

## 2020-01-02 NOTE — Telephone Encounter (Signed)
Spoke with Dr Theotis Barrio office to inform them Per Dr Mike Gip note she would like to consider effexor for this patient. i spoke with Judson Roch in front office and she has agreed to sent to provider and nurse and should get a call back at the office.

## 2020-01-09 ENCOUNTER — Telehealth: Payer: Self-pay

## 2020-01-09 NOTE — Telephone Encounter (Signed)
The patient states it has not been changed yet. i have informed her if she can contact me back at the office when it has been changed. I am going to reach out to her on friday aagin to see if things have been changed.

## 2020-01-11 ENCOUNTER — Encounter: Payer: Self-pay | Admitting: Radiation Oncology

## 2020-01-11 ENCOUNTER — Telehealth: Payer: Self-pay

## 2020-01-11 ENCOUNTER — Other Ambulatory Visit: Payer: Self-pay

## 2020-01-11 ENCOUNTER — Other Ambulatory Visit: Payer: Self-pay | Admitting: Hematology and Oncology

## 2020-01-11 ENCOUNTER — Ambulatory Visit
Admission: RE | Admit: 2020-01-11 | Discharge: 2020-01-11 | Disposition: A | Discharge status: Home / Self Care | Payer: Medicare Other | Source: Ambulatory Visit | Attending: Radiation Oncology | Admitting: Radiation Oncology

## 2020-01-11 VITALS — BP 127/85 | HR 97 | Temp 96.5°F | Wt 178.2 lb

## 2020-01-11 DIAGNOSIS — D696 Thrombocytopenia, unspecified: Secondary | ICD-10-CM | POA: Diagnosis not present

## 2020-01-11 DIAGNOSIS — C50811 Malignant neoplasm of overlapping sites of right female breast: Secondary | ICD-10-CM | POA: Insufficient documentation

## 2020-01-11 DIAGNOSIS — Z7982 Long term (current) use of aspirin: Secondary | ICD-10-CM | POA: Diagnosis not present

## 2020-01-11 DIAGNOSIS — N6311 Unspecified lump in the right breast, upper outer quadrant: Secondary | ICD-10-CM | POA: Diagnosis not present

## 2020-01-11 DIAGNOSIS — I509 Heart failure, unspecified: Secondary | ICD-10-CM | POA: Diagnosis not present

## 2020-01-11 DIAGNOSIS — F418 Other specified anxiety disorders: Secondary | ICD-10-CM | POA: Diagnosis not present

## 2020-01-11 DIAGNOSIS — K7460 Unspecified cirrhosis of liver: Secondary | ICD-10-CM | POA: Insufficient documentation

## 2020-01-11 DIAGNOSIS — Z170 Estrogen receptor positive status [ER+]: Secondary | ICD-10-CM | POA: Diagnosis not present

## 2020-01-11 DIAGNOSIS — I4891 Unspecified atrial fibrillation: Secondary | ICD-10-CM | POA: Diagnosis not present

## 2020-01-11 DIAGNOSIS — Z79899 Other long term (current) drug therapy: Secondary | ICD-10-CM | POA: Insufficient documentation

## 2020-01-11 DIAGNOSIS — K219 Gastro-esophageal reflux disease without esophagitis: Secondary | ICD-10-CM | POA: Insufficient documentation

## 2020-01-11 DIAGNOSIS — E785 Hyperlipidemia, unspecified: Secondary | ICD-10-CM | POA: Insufficient documentation

## 2020-01-11 DIAGNOSIS — I130 Hypertensive heart and chronic kidney disease with heart failure and stage 1 through stage 4 chronic kidney disease, or unspecified chronic kidney disease: Secondary | ICD-10-CM | POA: Diagnosis not present

## 2020-01-11 DIAGNOSIS — E039 Hypothyroidism, unspecified: Secondary | ICD-10-CM | POA: Diagnosis not present

## 2020-01-11 DIAGNOSIS — R222 Localized swelling, mass and lump, trunk: Secondary | ICD-10-CM | POA: Diagnosis not present

## 2020-01-11 DIAGNOSIS — N189 Chronic kidney disease, unspecified: Secondary | ICD-10-CM | POA: Diagnosis not present

## 2020-01-11 DIAGNOSIS — Z87891 Personal history of nicotine dependence: Secondary | ICD-10-CM | POA: Insufficient documentation

## 2020-01-11 MED ORDER — TAMOXIFEN CITRATE 20 MG PO TABS: 20.0000 mg | ORAL_TABLET | Freq: Every day | ORAL | 3 refills | Status: DC

## 2020-01-11 NOTE — Consult Note (Signed)
NEW PATIENT EVALUATION  Name: Adriana Martin  MRN: [id number]  Date:   01/11/2020     DOB: 19-Jan-1946   This 74 y.o. 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  REFERRING PHYSICIAN: [name], MD  CHIEF COMPLAINT:  Chief Complaint  Patient presents with  . Breast Cancer    Initial consultation    DIAGNOSIS: The encounter diagnosis was Carcinoma of overlapping sites of right breast in female, estrogen receptor positive ([place]).   PREVIOUS INVESTIGATIONS:  Mammogram and ultrasound reviewed Clinical notes reviewed Surgical pathology reviewed  HPI: Patient is a 74 year old female had approximate 7 years prior accelerated partial breast radiation to her right breast for stage I invasive mammary carcinoma ER/PR positive HER-2/neu not overexpressed.  She presented back in October with a suspicious mass in the right breast at the 10 o'clock position with at least 3 possible satellite lesions extending inferiomedial to the mass.  No evidence of right axillary lymphadenopathy on ultrasound was noted.  Dr. Tollie Pizza performed right modified radical mastectomy showing 4.3 x 2.1 x 2.0 cm invasive mammary carcinoma overall grade 2.  Margins were clear but close at 0.5 mm from inferior margin as well as 1 mm from the deep margin.  1 of 10 lymph nodes was positive for macro metastatic disease 2 mm.  There was no extracapsular extension.  Patient was present at our weekly tumor conference I asked to see the patient for possibility of postmastectomy radiation.  She is currently doing well although waiting to go on antiestrogen therapy.  She specifically denies chest wall tenderness bone pain cough.  PLANNED TREATMENT REGIMEN: Hypofractionated right chest wall radiation  PAST MEDICAL HISTORY:  has a past medical history of Anemia, Anxiety, Atrial  fibrillation ([place]), B12 deficiency, Breast cancer ([place]), Breast cancer ([place]), CHF (congestive heart failure) ([place]), Chronic kidney disease, Cirrhosis ([place]), COPD (chronic obstructive pulmonary disease) ([place]), Depression, GERD (gastroesophageal reflux disease), Headache, Heart murmur, Hyperlipemia, Hyperlipidemia, Hypertension, Hypothyroidism, Iron (Fe) deficiency anemia, Personal history of radiation therapy, Presence of permanent cardiac pacemaker, Shortness of breath dyspnea, Thrombocytopenia ([place]), and Vitamin D deficiency.    PAST SURGICAL HISTORY:  Past Surgical History:  Procedure Laterality Date  . ABDOMINAL HYSTERECTOMY     Partial  . ABLATION    . APPENDECTOMY    . BICEPT TENODESIS Right 01/07/2018   Procedure: BICEPS TENODESIS;  Surgeon: Leim Fabry, MD;  Location: [facility] ORS;  Service: Orthopedics;  Laterality: Right;  . BREAST BIOPSY Right 2012  . BREAST BIOPSY Right 07/21/2019   venus clip, [place] Bx, pending path   . BREAST LUMPECTOMY Right 2012   positive/rad  . BREAST SURGERY    . CARDIAC SURGERY    . CARDIAC VALVE REPLACEMENT    . COLONOSCOPY  2012  . ELECTROPHYSIOLOGIC STUDY N/A 01/23/2015   Procedure: CARDIOVERSION;  Surgeon: Corey Skains, MD;  Location: [facility] OR[facility];  Service: Cardiovascular;  Laterality: N/A;  . ELECTROPHYSIOLOGIC STUDY N/A 05/27/2016   Procedure: CARDIOVERSION;  Surgeon: Corey Skains, MD;  Location: [facility] ORS;  Service: Cardiovascular;  Laterality: N/A;  . ESOPHAGOGASTRODUODENOSCOPY  2012  . EYE SURGERY    . MASTECTOMY WITH AXILLARY LYMPH NODE DISSECTION Right 08/09/2019   Procedure: MASTECTOMY WITH AXILLARY LYMPH NODE DISSECTION;  Surgeon: Robert Bellow, MD;  Location: [facility] OR[facility];  Service: General;  Laterality: Right;  . MITRAL VALVE REPLACEMENT    .  mvp    . REVERSE SHOULDER ARTHROPLASTY Right 01/07/2018   Procedure: REVERSE SHOULDER ARTHROPLASTY;  Surgeon: Leim Fabry, MD;  Location: [facility] OR[facility];  Service: Orthopedics;  Laterality: Right;  Marland Kitchen VSD  REPAIR      FAMILY HISTORY: family history includes Breast cancer in her daughter and mother; Cancer in her brother, daughter, father, and mother.  SOCIAL HISTORY:  reports that she quit smoking about 21 years ago. Her smoking use included cigarettes. She has a 7.50 pack-year smoking history. She has never used smokeless tobacco. She reports that she does not drink alcohol or use drugs.  ALLERGIES: Atorvastatin, Calcium, Calcium carbonate, Fluoxetine, and Hydrochlorothiazide  MEDICATIONS:  Current Outpatient Medications  Medication Sig Dispense Refill  . acetaminophen (TYLENOL) 500 MG tablet Take 1,000 mg by mouth every 6 (six) hours as needed.    Marland Kitchen ADVAIR DISKUS 100-50 MCG/DOSE AEPB Inhale 1 puff into the lungs 2 (two) times daily.   0  . albuterol (VENTOLIN HFA) 108 (90 Base) MCG/ACT inhaler Inhale 2 puffs into the lungs every 6 (six) hours as needed for wheezing or shortness of breath.    . anastrozole (ARIMIDEX) 1 MG tablet Take 1 tablet (1 mg total) by mouth daily. 30 tablet 6  . aspirin EC 81 MG tablet Take 81 mg by mouth daily.    . B Complex-C-Zn-Folic Acid (DIALYVITE/ZINC) TABS Take 1 tablet by mouth daily.     Marland Kitchen diltiazem (CARDIZEM CD) 180 MG 24 hr capsule Take 180 mg by mouth daily.    Marland Kitchen ezetimibe (ZETIA) 10 MG tablet Take 10 mg by mouth daily.    . ferrous sulfate 325 (65 FE) MG tablet Take 325 mg by mouth daily with breakfast.    . furosemide (LASIX) 40 MG tablet Take 40 mg by mouth daily.   1  . HYDROcodone-acetaminophen (NORCO/VICODIN) 5-325 MG tablet Take 1 tablet by mouth every 4 (four) hours as needed for moderate pain. 20 tablet 0  . levothyroxine (SYNTHROID, LEVOTHROID) 50 MCG tablet Take 50 mcg by mouth daily before breakfast.    . lisinopril (PRINIVIL,ZESTRIL) 5 MG tablet Take 5 mg by mouth daily.   98  . Multiple Vitamins-Minerals (CENTRUM SILVER ULTRA WOMENS) TABS Take 1 tablet by mouth daily.    . Multiple Vitamins-Minerals (PRESERVISION AREDS 2 PO) Take 2 capsules  by mouth daily.    . potassium chloride (K-DUR,KLOR-CON) 10 MEQ tablet Take 20 mEq by mouth 2 (two) times daily.     . pravastatin (PRAVACHOL) 20 MG tablet Take 20 mg by mouth at bedtime.    . predniSONE (DELTASONE) 5 MG tablet Take 5 mg by mouth daily with breakfast.    . Probiotic Product (PROBIOTIC DAILY PO) Take by mouth.    . sertraline (ZOLOFT) 100 MG tablet Take 100 mg by mouth daily.    . ursodiol (ACTIGALL) 300 MG capsule Take 300 mg by mouth 2 (two) times daily.     . Vitamin D, Ergocalciferol, (DRISDOL) 50000 units CAPS capsule Take 50,000 Units by mouth every 14 (fourteen) days.      No current facility-administered medications for this encounter.    ECOG PERFORMANCE STATUS:  0 - Asymptomatic  REVIEW OF SYSTEMS: Patient denies any weight loss, fatigue, weakness, fever, chills or night sweats. Patient denies any loss of vision, blurred vision. Patient denies any ringing  of the ears or hearing loss. No irregular heartbeat. Patient denies heart murmur or history of fainting. Patient denies any chest pain or pain radiating to her upper  extremities. Patient denies any shortness of breath, difficulty breathing at night, cough or hemoptysis. Patient denies any swelling in the lower legs. Patient denies any nausea vomiting, vomiting of blood, or coffee ground material in the vomitus. Patient denies any stomach pain. Patient states has had normal bowel movements no significant constipation or diarrhea. Patient denies any dysuria, hematuria or significant nocturia. Patient denies any problems walking, swelling in the joints or loss of balance. Patient denies any skin changes, loss of hair or loss of weight. Patient denies any excessive worrying or anxiety or significant depression. Patient denies any problems with insomnia. Patient denies excessive thirst, polyuria, polydipsia. Patient denies any swollen glands, patient denies easy bruising or easy bleeding. Patient denies any recent infections,  allergies or URI. Patient "s visual fields have not changed significantly in recent time.   PHYSICAL EXAM: BP 127/85 (BP Location: Left Arm, Patient Position: Sitting, Cuff Size: Normal)   Pulse 97   Temp (!) 96.5 F (35.8 C) (Tympanic)   Wt 178 lb 4 oz (80.9 kg)   BMI 32.60 kg/m  Patient status post right modified radical mastectomy chest wall is clear no evidence of mass or nodularity left breast is free of dominant mass.  No axillary or supraclavicular adenopathy is appreciated.  Well-developed well-nourished patient in NAD. HEENT reveals PERLA, EOMI, discs not visualized.  Oral cavity is clear. No oral mucosal lesions are identified. Neck is clear without evidence of cervical or supraclavicular adenopathy. Lungs are clear to A&P. Cardiac examination is essentially unremarkable with regular rate and rhythm without murmur rub or thrill. Abdomen is benign with no organomegaly or masses noted. Motor sensory and DTR levels are equal and symmetric in the upper and lower extremities. Cranial nerves II through XII are grossly intact. Proprioception is intact. No peripheral adenopathy or edema is identified. No motor or sensory levels are noted. Crude visual fields are within normal range.  LABORATORY DATA: Pathology report reviewed    RADIOLOGY RESULTS: Mammogram and ultrasound reviewed   IMPRESSION: Stage IIb recurrent invasive mammary carcinoma the right breast status post right modified radical mastectomy ER/PR positive and 74 year old female  PLAN: At this time based on her close margins poor prognostic factors of over 4 cm mass I would recommend a hypofractionated course of radiation therapy over 3 weeks to her right chest wall.  Would exclude her axilla since there was only 1 out of 10 lymph nodes positive and this would extend her treatments out to 7 weeks of treatment.  Patient may accept a 3-week hypofractionated course.  Risks and benefits of treatment occluding skin reaction fatigue  alteration of blood counts possible inclusion of superficial lung all were discussed in detail.  I have personally set up and ordered CT simulation for next week and patient will discuss my recommendations with her family.  She certainly at this point was opposed to any postmastectomy radiation.  I have also contacted Dr. Kem Parkinson office to navigate her prescription for antiestrogen therapy.  Patient and her daughter both comprehend her treatment plan well.  I would like to take this opportunity to thank you for allowing me to participate in the care of your patient.Noreene Filbert, MD

## 2020-01-11 NOTE — Telephone Encounter (Signed)
Spoke with the patient to inform her that Dr Mike Gip has sent the Tamoxifen to the pharmacy. I rechecked to make sure the patient is not taking Zoloft and she states no she is not but haven't gotten a new script to replace it yet. The patient states she will go pick it up Saturday.

## 2020-01-15 ENCOUNTER — Other Ambulatory Visit: Payer: Self-pay | Admitting: Hematology and Oncology

## 2020-01-16 ENCOUNTER — Inpatient Hospital Stay (HOSPITAL_BASED_OUTPATIENT_CLINIC_OR_DEPARTMENT_OTHER): Payer: Medicare Other | Admitting: Genetic Counselor

## 2020-01-16 ENCOUNTER — Encounter: Payer: Self-pay | Admitting: Genetic Counselor

## 2020-01-16 DIAGNOSIS — Z1379 Encounter for other screening for genetic and chromosomal anomalies: Secondary | ICD-10-CM | POA: Insufficient documentation

## 2020-01-16 DIAGNOSIS — Z803 Family history of malignant neoplasm of breast: Secondary | ICD-10-CM | POA: Insufficient documentation

## 2020-01-16 DIAGNOSIS — Z170 Estrogen receptor positive status [ER+]: Secondary | ICD-10-CM

## 2020-01-16 DIAGNOSIS — Z800 Family history of malignant neoplasm of digestive organs: Secondary | ICD-10-CM | POA: Insufficient documentation

## 2020-01-16 DIAGNOSIS — Z807 Family history of other malignant neoplasms of lymphoid, hematopoietic and related tissues: Secondary | ICD-10-CM

## 2020-01-16 DIAGNOSIS — C50811 Malignant neoplasm of overlapping sites of right female breast: Secondary | ICD-10-CM

## 2020-01-16 DIAGNOSIS — Z9011 Acquired absence of right breast and nipple: Secondary | ICD-10-CM | POA: Insufficient documentation

## 2020-01-16 DIAGNOSIS — Z923 Personal history of irradiation: Secondary | ICD-10-CM | POA: Insufficient documentation

## 2020-01-16 NOTE — Progress Notes (Signed)
REFERRING PROVIDER: Lequita Asal, MD [address] [place],  [place] [address]  PRIMARY PROVIDER:  [name], MD  PRIMARY REASON FOR VISIT:  1. Carcinoma of overlapping sites of right breast in female, estrogen receptor positive ([place])   2. Family history of breast cancer   3. Family history of multiple myeloma   4. Family history of esophageal cancer      I connected with Adriana Martin on 01/16/2020 at 9:00 am EDT by MyChart video conference and verified that I am speaking with the correct person using two identifiers.   Patient location: Home Provider location: [facility] office  HISTORY OF PRESENT ILLNESS:   Adriana Martin, a 74 y.o. female, was seen for a [place] cancer genetics consultation at the request of Dr. Mike Gip due to a personal and family history of breast cancer.  Adriana Martin presents to clinic today to discuss the possibility of a hereditary predisposition to cancer, genetic testing, and to further clarify her future cancer risks, as well as potential cancer risks for family members.   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  ---------------------------------------------------   DIAGNOSIS: BREAST CANCER/ JAK-2  POS ET/PV/MGUS  STAGE:  I       ;GOALS: cure  CURRENT/MOST RECENT THERAPY: surveillaince    Carcinoma of overlapping sites of right breast in female, estrogen receptor positive ([place])    RISK FACTORS:  Menarche was at unknown age.  First live birth at age 67.  OCP use for approximately 2 years.  Ovaries intact: no.  Hysterectomy: yes.  Menopausal status: postmenopausal.  HRT use: 0 years. Colonoscopy: yes; normal, per patient. Mammogram within the last year: yes.   Past Medical History:  Diagnosis Date  . Anemia   . Anxiety   . Atrial fibrillation ([place])   . B12 deficiency   . Breast cancer ([place])    RT Lumpectomy c radiation 2012  . Breast cancer ([place])   . CHF (congestive heart failure) ([place])   . Chronic kidney disease   . Cirrhosis ([place])   . COPD (chronic obstructive pulmonary disease) ([place])   . Depression   . Family history of breast cancer   . Family history of esophageal cancer   . Family history of multiple myeloma   . GERD (gastroesophageal reflux disease)   . Headache   . Heart murmur   . Hyperlipemia   . Hyperlipidemia   . Hypertension   . Hypothyroidism   . Iron (Fe) deficiency anemia   . Personal history of radiation therapy   . Presence of permanent cardiac pacemaker   . Shortness of breath dyspnea   . Thrombocytopenia ([place])   . Vitamin D deficiency     Past Surgical History:  Procedure Laterality  Date  . ABDOMINAL HYSTERECTOMY     Partial  . ABLATION    . APPENDECTOMY    . BICEPT TENODESIS Right 01/07/2018   Procedure: BICEPS TENODESIS;  Surgeon: Leim Fabry, MD;  Location: [facility] ORS;  Service: Orthopedics;  Laterality: Right;  . BREAST BIOPSY Right 2012  . BREAST BIOPSY Right 07/21/2019   venus clip, [place] Bx, pending path   . BREAST LUMPECTOMY Right 2012   positive/rad  . BREAST SURGERY    . CARDIAC SURGERY    . CARDIAC VALVE REPLACEMENT    . COLONOSCOPY  2012  . ELECTROPHYSIOLOGIC STUDY N/A 01/23/2015   Procedure: CARDIOVERSION;  Surgeon:  Corey Skains, MD;  Location: [facility] ORS;  Service: Cardiovascular;  Laterality: N/A;  . ELECTROPHYSIOLOGIC STUDY N/A 05/27/2016   Procedure: CARDIOVERSION;  Surgeon: Corey Skains, MD;  Location: [facility] ORS;  Service: Cardiovascular;  Laterality: N/A;  . ESOPHAGOGASTRODUODENOSCOPY  2012  . EYE SURGERY    . MASTECTOMY WITH AXILLARY LYMPH NODE DISSECTION Right 08/09/2019   Procedure: MASTECTOMY WITH AXILLARY LYMPH NODE DISSECTION;  Surgeon: Robert Bellow, MD;  Location: [facility] ORS;  Service: General;  Laterality: Right;  . MITRAL VALVE REPLACEMENT    . mvp    . REVERSE SHOULDER ARTHROPLASTY Right 01/07/2018   Procedure: REVERSE SHOULDER ARTHROPLASTY;  Surgeon: Leim Fabry, MD;  Location: [facility] ORS;  Service: Orthopedics;  Laterality: Right;  Marland Kitchen VSD REPAIR      Social History   Socioeconomic History  . Marital status: Divorced    Spouse name: Not on file  . Number of children: Not on file  . Years of education: Not on file  . Highest education level: Not on file  Occupational History  . Not on file  Tobacco Use  . Smoking status: Former Smoker    Packs/day: 0.25    Years: 30.00    Pack years: 7.50    Types: Cigarettes    Quit date: 10/07/1998    Years since quitting: 21.2  . Smokeless tobacco: Never Used  Substance and Sexual Activity  . Alcohol use: No    Alcohol/week: 4.0 standard drinks    Types: 4 Cans of beer per week    Comment: socially  . Drug use: No  . Sexual activity: Not on file  Other Topics Concern  . Not on file  Social History Narrative  . Not on file   Social Determinants of Health   Financial Resource Strain:   . Difficulty of Paying Living Expenses:   Food Insecurity:   . Worried About Charity fundraiser in the Last Year:   . Arboriculturist in the Last Year:   Transportation Needs:   . Film/video editor (Medical):   Marland Kitchen Lack of Transportation (Non-Medical):   Physical Activity:   . Days of Exercise per Week:   . Minutes of Exercise per  Session:   Stress:   . Feeling of Stress :   Social Connections:   . Frequency of Communication with Friends and Family:   . Frequency of Social Gatherings with Friends and Family:   . Attends Religious Services:   . Active Member of Clubs or Organizations:   . Attends Archivist Meetings:   Marland Kitchen Marital Status:      FAMILY HISTORY:  We obtained a detailed, 4-generation family history.  Significant diagnoses are listed below: Family History  Problem Relation Age of Onset  . Breast cancer Mother  never treated, dx. >50  . Multiple myeloma Father   . Esophageal cancer Brother   . Breast cancer Daughter 7  . Cancer Daughter 74       rare blood cancer    Adriana Martin has three daughters and one son. [name] was first diagnosed with breast cancer at the age of 52 or 41, and died at the age of 79. Another of her daughters was recently diagnosed with a rare blood cancer that is being monitored. Adriana Martin had one brother who died in his 19s from esophageal cancer, and a sister who is 22/66 and has not had cancer.  Adriana Martin mother died in her 57s and had breast cancer that was never treated. Adriana Martin had one maternal uncle who died in his late 61s or early 90s. She does not have information about her maternal grandmother, and knows that her maternal grandfather died when he was older than 56. There are no other known diagnoses on the maternal side of the family.  Adriana Martin father died at the age of 35 and had a history of multiple myeloma. She had two paternal aunts and one paternal uncle, all of whom died when they were older than 48. She does not know of cancer among any paternal cousins. She does not have any information about her paternal grandparents. There are no other known diagnoses of cancer on the paternal side of the family.  Adriana Martin is aware of previous family history of genetic testing for hereditary cancer risks in her daughter who had breast  cancer, which was reportedly negative. Further details about this genetic testing are unavailable. Her ancestry is unknown. There is no reported Ashkenazi Jewish ancestry. There is no known consanguinity.  GENETIC COUNSELING ASSESSMENT: Adriana Martin is a 74 y.o. female with a personal and family history of breast cancer, which is somewhat suggestive of a hereditary cancer syndrome and predisposition to cancer. We, therefore, discussed and recommended the following at today's visit.   DISCUSSION:  Approximately 5-10% of breast cancer is hereditary. Most cases of hereditary breast cancer are associated with the BRCA1 and BRCA2 genes, although there are other genes that can be associated with hereditary breast cancer syndromes. We discussed that identifying a hereditary cancer syndrome can be beneficial for several reasons, including knowing about other cancer risks, identifying potential screening and risk-reduction options that may be appropriate, and to understand if other family members could be at risk for cancer and allow them to undergo genetic testing.  We reviewed the characteristics, features and inheritance patterns of hereditary cancer syndromes. We also discussed genetic testing, including the appropriate family members to test and the implications of a negative vs a positive. Based on Adriana Martin's personal and family history of cancer, she meets medical criteria for genetic testing. We recommended Adriana Martin pursue genetic testing for a hereditary cancer gene panel including known breast cancer genes, if interested.   Ms. July expressed that she is not interested in genetic testing at this time. We discussed that others in the family may still benefit from genetic testing to better determine what their cancer risks and cancer screening recommendations may be. Additionally, family members may be at an increased risk for breast cancer simply based on the family history. It will be important for family  members to discuss the family history of cancer with their doctors to ensure they receive appropriate cancer screening.  PLAN: Adriana Martin did not wish to pursue genetic testing at  today's visit. We understand this decision and remain available to coordinate genetic testing at any time in the future. We, therefore, recommend Adriana Martin continue to follow the cancer screening guidelines given by her primary healthcare provider.  Based on Ms. Winsett's family history, we recommended her first degree relatives have genetic counseling and testing. Adriana Martin will let us know if we can be of any assistance in coordinating genetic counseling and/or testing for this family member.   Adriana Martin questions were answered to her satisfaction today. Our contact information was provided should additional questions or concerns arise. Thank you for the referral and allowing [place] to share in the care of your patient.   Clint Guy, [place], [facility] Licensed, Certified Dispensing [email or web address] Phone: [phone]  The patient was seen for a total of 30 minutes in face-to-face genetic counseling.  This patient was discussed with Drs. Magrinat, Lindi Adie and/or Burr Medico who agrees with the above.    _______________________________________________________________________ For Office Staff:  Number of people involved in session: 1 Was an Intern/ student involved with case: no

## 2020-01-18 ENCOUNTER — Ambulatory Visit: Payer: Medicare Other

## 2020-01-21 DIAGNOSIS — M8580 Other specified disorders of bone density and structure, unspecified site: Secondary | ICD-10-CM | POA: Insufficient documentation

## 2020-01-23 ENCOUNTER — Inpatient Hospital Stay: Payer: Medicare Other

## 2020-01-23 ENCOUNTER — Other Ambulatory Visit: Payer: Self-pay

## 2020-01-23 ENCOUNTER — Encounter: Payer: Self-pay | Admitting: Hematology and Oncology

## 2020-01-23 ENCOUNTER — Inpatient Hospital Stay: Payer: Medicare Other | Attending: Hematology and Oncology | Admitting: Hematology and Oncology

## 2020-01-23 VITALS — BP 103/53 | HR 90 | Temp 96.5°F | Resp 18 | Ht 62.0 in | Wt 179.3 lb

## 2020-01-23 DIAGNOSIS — C50811 Malignant neoplasm of overlapping sites of right female breast: Secondary | ICD-10-CM | POA: Diagnosis not present

## 2020-01-23 DIAGNOSIS — Z170 Estrogen receptor positive status [ER+]: Secondary | ICD-10-CM | POA: Insufficient documentation

## 2020-01-23 DIAGNOSIS — Z9011 Acquired absence of right breast and nipple: Secondary | ICD-10-CM | POA: Insufficient documentation

## 2020-01-23 DIAGNOSIS — D473 Essential (hemorrhagic) thrombocythemia: Secondary | ICD-10-CM

## 2020-01-23 DIAGNOSIS — Z923 Personal history of irradiation: Secondary | ICD-10-CM | POA: Diagnosis not present

## 2020-01-23 DIAGNOSIS — M85851 Other specified disorders of bone density and structure, right thigh: Secondary | ICD-10-CM | POA: Diagnosis not present

## 2020-01-23 DIAGNOSIS — D75839 Thrombocytosis, unspecified: Secondary | ICD-10-CM

## 2020-01-23 LAB — HEPATIC FUNCTION PANEL
ALT: 49 U/L — ABNORMAL HIGH (ref 0–44)
AST: 50 U/L — ABNORMAL HIGH (ref 15–41)
Albumin: 4.3 g/dL (ref 3.5–5.0)
Alkaline Phosphatase: 128 U/L — ABNORMAL HIGH (ref 38–126)
Bilirubin, Direct: 0.1 mg/dL (ref 0.0–0.2)
Indirect Bilirubin: 0.6 mg/dL (ref 0.3–0.9)
Total Bilirubin: 0.7 mg/dL (ref 0.3–1.2)
Total Protein: 7.8 g/dL (ref 6.5–8.1)

## 2020-01-23 NOTE — Patient Instructions (Signed)
  Patient to stop tamoxifen during the weeks she receives radiation.

## 2020-01-23 NOTE — Progress Notes (Signed)
[facility]  [address] [place], [place] [address] Phone: [phone]  Fax: [phone]   Clinic Day:  01/23/2020  Referring physician: [name], MD  Chief Complaint: Adriana Martin is a 74 y.o. female with recurrent right breast cancer, JAK2+thrombocytosis,and amonoclonal gammopathyof unknown significance (MGUS) who is seen for assessment on tamoxifen.   HPI: The patient was last seen in the medical oncology clinic on 12/19/2019. At that time, she felt "ok".    She described her right arm numbness status post fracture and shortness of breath.  Bone density revealed osteopenia with a T score of -2.1.  She was initially undecided about endocrine therapy.  During clinic decision was made to pursue tamoxifen.  Tamoxifen was noted to interact with Zoloft and make it less effective.  I spoke with Dr. Raechel Ache.  Decision was made to change her to Effexor.    She began tamoxifen on 01/16/2020. She has been taking Effexor for 1.5 weeks. She has been tolerating both well.   She met with Dr. Noreene Filbert on 01/11/2020 to discuss radiation therapy. He recommended hypofractionated right chest wall radiation for three weeks. She is hesitant to undergo additional radiation therapy. The patient's daughter said Dr. Baruch Gouty verbally said there was a 30% chance of the cancer returning without radiation. She is concerned about transportation.   She met with Genetic Counseling on 01/16/2020. At that time, she did not wish to pursue genetic testing.  She follows up with Dr. Raechel Ache again in 3 months.   During the interim, she has been well. She has been enjoying the weather. She continues to have some numbness in her arm since the fracture. She does not do monthly breast exams.    Past Medical History:  Diagnosis Date  . Anemia   . Anxiety   . Atrial fibrillation ([place])   . B12 deficiency   . Breast cancer ([place])    RT Lumpectomy c radiation 2012  . Breast  cancer ([place])   . CHF (congestive heart failure) ([place])   . Chronic kidney disease   . Cirrhosis ([place])   . COPD (chronic obstructive pulmonary disease) ([place])   . Depression   . Family history of breast cancer   . Family history of esophageal cancer   . Family history of multiple myeloma   . GERD (gastroesophageal reflux disease)   . Headache   . Heart murmur   . Hyperlipemia   . Hyperlipidemia   . Hypertension   . Hypothyroidism   . Iron (Fe) deficiency anemia   . Personal history of radiation therapy   . Presence of permanent cardiac pacemaker   . Shortness of breath dyspnea   . Thrombocytopenia ([place])   . Vitamin D deficiency     Past Surgical History:  Procedure Laterality Date  . ABDOMINAL HYSTERECTOMY     Partial  . ABLATION    . APPENDECTOMY    . BICEPT TENODESIS Right 01/07/2018   Procedure: BICEPS TENODESIS;  Surgeon: Leim Fabry, MD;  Location: [facility] OR[facility];  Service: Orthopedics;  Laterality: Right;  . BREAST BIOPSY Right 2012  . BREAST BIOPSY Right 07/21/2019   venus clip, [place] Bx, pending path   . BREAST LUMPECTOMY Right 2012   positive/rad  . BREAST SURGERY    . CARDIAC SURGERY    . CARDIAC VALVE REPLACEMENT    . COLONOSCOPY  2012  . ELECTROPHYSIOLOGIC STUDY N/A 01/23/2015   Procedure: CARDIOVERSION;  Surgeon: Corey Skains, MD;  Location:  [facility];  Service: Cardiovascular;  Laterality: N/A;  . ELECTROPHYSIOLOGIC STUDY N/A 05/27/2016   Procedure: CARDIOVERSION;  Surgeon: Corey Skains, MD;  Location: [facility];  Service: Cardiovascular;  Laterality: N/A;  . ESOPHAGOGASTRODUODENOSCOPY  2012  . EYE SURGERY    . MASTECTOMY WITH AXILLARY LYMPH NODE DISSECTION Right 08/09/2019   Procedure: MASTECTOMY WITH AXILLARY LYMPH NODE DISSECTION;  Surgeon: Robert Bellow, MD;  Location: [facility];  Service: General;  Laterality: Right;  . MITRAL VALVE REPLACEMENT    . mvp    . REVERSE SHOULDER ARTHROPLASTY Right 01/07/2018   Procedure: REVERSE SHOULDER ARTHROPLASTY;   Surgeon: Leim Fabry, MD;  Location: [facility] OR[facility];  Service: Orthopedics;  Laterality: Right;  Marland Kitchen VSD REPAIR      Family History  Problem Relation Age of Onset  . Breast cancer Mother        never treated, dx. >50  . Multiple myeloma Father   . Esophageal cancer Brother   . Breast cancer Daughter 1  . Cancer Daughter 42       rare blood cancer    Social History:  reports that she quit smoking about 21 years ago. Her smoking use included cigarettes. She has a 7.50 pack-year smoking history. She has never used smokeless tobacco. She reports that she does not drink alcohol or use drugs. She lives in [place]. The patient is accompanied by her daughter today.   Allergies:  Allergies  Allergen Reactions  . Atorvastatin Shortness Of Breath  . Calcium Shortness Of Breath  . Calcium Carbonate     Other reaction(s): Unknown  . Fluoxetine     Other reaction(s): Unknown  . Hydrochlorothiazide     Other reaction(s): Unknown    Current Medications: Current Outpatient Medications  Medication Sig Dispense Refill  . acetaminophen (TYLENOL) 500 MG tablet Take 1,000 mg by mouth every 6 (six) hours as needed.    Marland Kitchen ADVAIR DISKUS 100-50 MCG/DOSE AEPB Inhale 1 puff into the lungs 2 (two) times daily.   0  . albuterol (VENTOLIN HFA) 108 (90 Base) MCG/ACT inhaler Inhale 2 puffs into the lungs every 6 (six) hours as needed for wheezing or shortness of breath.    Marland Kitchen aspirin EC 81 MG tablet Take 81 mg by mouth daily.    . B Complex-C-Zn-Folic Acid (DIALYVITE/ZINC) TABS Take 1 tablet by mouth daily.     Marland Kitchen diltiazem (CARDIZEM CD) 180 MG 24 hr capsule Take 180 mg by mouth daily.    Marland Kitchen ezetimibe (ZETIA) 10 MG tablet Take 10 mg by mouth daily.    . ferrous sulfate 325 (65 FE) MG tablet Take 325 mg by mouth daily with breakfast.    . furosemide (LASIX) 40 MG tablet Take 40 mg by mouth daily.   1  . HYDROcodone-acetaminophen (NORCO/VICODIN) 5-325 MG tablet Take 1 tablet by mouth every 4 (four) hours as needed for  moderate pain. 20 tablet 0  . levothyroxine (SYNTHROID, LEVOTHROID) 50 MCG tablet Take 50 mcg by mouth daily before breakfast.    . lisinopril (PRINIVIL,ZESTRIL) 5 MG tablet Take 5 mg by mouth daily.   98  . Multiple Vitamins-Minerals (PRESERVISION AREDS 2 PO) Take 2 capsules by mouth daily.    . potassium chloride (K-DUR,KLOR-CON) 10 MEQ tablet Take 20 mEq by mouth 2 (two) times daily.     . pravastatin (PRAVACHOL) 20 MG tablet Take 20 mg by mouth at bedtime.    . predniSONE (DELTASONE) 5 MG tablet Take 5 mg by mouth daily with  breakfast.    . Probiotic Product (PROBIOTIC DAILY PO) Take by mouth.    . tamoxifen (NOLVADEX) 20 MG tablet Take 1 tablet (20 mg total) by mouth daily. 30 tablet 3  . ursodiol (ACTIGALL) 300 MG capsule Take 300 mg by mouth 2 (two) times daily.     Marland Kitchen venlafaxine XR (EFFEXOR-XR) 37.5 MG 24 hr capsule Take 37.5 mg by mouth daily.    . Vitamin D, Ergocalciferol, (DRISDOL) 50000 units CAPS capsule Take 50,000 Units by mouth every 14 (fourteen) days.     Marland Kitchen anastrozole (ARIMIDEX) 1 MG tablet Take 1 tablet (1 mg total) by mouth daily. 30 tablet 6  . Multiple Vitamins-Minerals (CENTRUM SILVER ULTRA WOMENS) TABS Take 1 tablet by mouth daily.    . sertraline (ZOLOFT) 100 MG tablet Take 100 mg by mouth daily.     No current facility-administered medications for this visit.    Review of Systems  Constitutional: Negative.  Negative for chills, diaphoresis, fever, malaise/fatigue and weight loss (up 2 lbs).       Feels "ok".  HENT: Negative.  Negative for congestion, ear discharge, ear pain, hearing loss, nosebleeds, sinus pain and sore throat.        No teeth. Dentures. No gum breakdown.  Eyes: Negative.  Negative for blurred vision and double vision.  Respiratory: Positive for shortness of breath (on exertion). Negative for cough, hemoptysis and sputum production.   Cardiovascular: Negative.  Negative for chest pain, palpitations and leg swelling.  Gastrointestinal: Negative.   Negative for abdominal pain, blood in stool, constipation, diarrhea, heartburn, melena, nausea and vomiting.  Genitourinary: Negative for dysuria, frequency, hematuria and urgency.       Chronic kidney disease.  Musculoskeletal: Negative for back pain, joint pain, myalgias and neck pain.       Right humoral shaft fracture on 12/05/2018.   Skin: Negative.  Negative for itching and rash.  Neurological: Positive for sensory change (right arm numbness s/p fracture). Negative for dizziness, tingling, speech change, focal weakness, weakness and headaches.  Endo/Heme/Allergies: Negative.  Does not bruise/bleed easily.  Psychiatric/Behavioral: Negative.  Negative for depression and memory loss. The patient is not nervous/anxious and does not have insomnia.   All other systems reviewed and are negative.  Performance status (ECOG): 1 - Symptomatic but completely ambulatory  Vitals Blood pressure (!) 103/53, pulse 90, temperature (!) 96.5 F (35.8 C), temperature source Tympanic, resp. rate 18, height _0  (1.575 m), weight 179 lb 5.5 oz (81.4 kg), SpO2 96 %.   Physical Exam  Constitutional: She is oriented to person, place, and time. She appears well-developed and well-nourished. No distress. Face mask in place.  She has a cane by her side.  HENT:  Head: Normocephalic and atraumatic.  Right Ear: Hearing normal.  Left Ear: Hearing normal.  Mouth/Throat: Oropharynx is clear and moist and mucous membranes are normal. No oral lesions.  Curly graying hair.  Mask.  Eyes: Pupils are equal, round, and reactive to light. Conjunctivae and EOM are normal. No scleral icterus.  Glasses.  Blue eyes.  Cardiovascular: Normal rate, regular rhythm and normal heart sounds. Exam reveals no gallop and no friction rub.  No murmur heard. Pulmonary/Chest: Effort normal and breath sounds normal. She has no wheezes. She has no rhonchi. She has no rales.  Abdominal: Soft. Normal appearance and bowel sounds are normal.  She exhibits no mass. There is no hepatosplenomegaly. There is no abdominal tenderness. There is no CVA tenderness.  Musculoskeletal:  General: Normal range of motion.     Cervical back: Normal range of motion and neck supple.  Lymphadenopathy:    She has no cervical adenopathy.       Right cervical: No superficial cervical adenopathy present.   She has no axillary adenopathy.       Right: No inguinal adenopathy present.       Left: No inguinal adenopathy present.  Neurological: She is alert and oriented to person, place, and time.  Skin: Skin is warm, dry and intact. No bruising, no lesion and no rash noted. She is not diaphoretic. No erythema.  Psychiatric: She has a normal mood and affect. Her behavior is normal. Judgment and thought content normal.  Nursing note and vitals reviewed.   Appointment on 01/23/2020  Component Date Value Ref Range Status  . Total Protein 01/23/2020 7.8  6.5 - 8.1 g/dL Final  . Albumin 01/23/2020 4.3  3.5 - 5.0 g/dL Final  . AST 01/23/2020 50* 15 - 41 U/L Final  . ALT 01/23/2020 49* 0 - 44 U/L Final  . Alkaline Phosphatase 01/23/2020 128* 38 - 126 U/L Final  . Total Bilirubin 01/23/2020 0.7  0.3 - 1.2 mg/dL Final  . Bilirubin, Direct 01/23/2020 0.1  0.0 - 0.2 mg/dL Final  . Indirect Bilirubin 01/23/2020 0.6  0.3 - 0.9 mg/dL Final   Performed at [facility], [address], [place], [place] [address]    Assessment:  Adriana Martin is a 74 y.o. female withrecurrent right breast cancer, JAK2+thrombocytosis, and amonoclonal gammopathyof unknown significance (MGUS).  She has a history of stageIrightbreast cancers/p lumpectomy and SLN biopsy on 02/18/2011.Pathologyrevealed a 1.1 cm grade IIductal carcinoma tumor. Tumor was ER+ (>90%),PR+ (>90%),andHer2/neunegative by FISH.Pathologic stagewas T1c pN0 (sn). She received accelerated partial breast irradiation(2012). She completedArimidexin 08/2016.    Bilateralscreeningmammogramon 06/22/2018 revealed no malignancy.Right unilateral breast mammogram and ultrasoundon 07/12/2019 revealed a suspicious 2.2 x 0.7 x 1.5 cm irregular hypoechoicmass in the right breast at 10 o'clock, with at least 3 possible satellite lesions(each measuring 3-4 mm)extending inferior and medial. There was no right axillary lymphadenopathy. Ultrasound guided biopsy was recommended for the dominant mass in the right breast.   Ultrasound guidedright breast core needle biopsyon 07/21/2019 revealed a 12 mm grade II invasive mammary carcinomawith lobular features.Tumor is ER + (> 90%), PR + (> 90%), and Her2/neu 1+.  She underwent a right mastectomy and SLN biopsy on 08/09/2019.  Pathology revealed a 4.3 x 2.1 x 2.0 cm grade II invasive lobular carcinoma. There was lobular carcinoma in situ focally present.  Margins were uninvolved by invasive carcinoma.  Closest margin was < 0.5 mm from the inferior and 1 mm from the deep margin.  There was 1 of 10 lymph nodes with metastatic carcinoma.  The largest metastatic deposit was 2 mm.  Tumor was ER was positive (>90%), PR positive (>90%), and Her2/neu 1+.   Pathologic stage was mpT2 p N1a.  Mammaprint revealed a low risk luminal-type (A).  From the MINDACT trial if lymph node negative, ER+ and Her2/neu - there was a 97.8% chance of living without distant recurrence of breast cancer at 5 years (DMFI).   She has hadchronicthrombocytosissince2001.JAK2 V617Fwas positive on 12/14/2017. Platelet countranged between 400,000 - 500,000. She had a bone marrowin 2001 (no report available). She was previously onhydroxyurea(discontinued in 05/2016).    She has a monoclonal gammopathyof unknown significance (MGUS).SPEPon 03/26/2019revealed a0.3 gm/dL IgG monoclonal proteinwith lambda light chain specificity. Bone surveyon 12/14/2017 revealed scattered lytic lesions  throughout the calvarium concerning for  metastases or myeloma.There was compression fractures at T8 and L1(new since 2017).  Bone survey on 07/27/2019 showed a few scattered lucencies were noted in the calvarium, unchanged. There were no lytic myelomatous lesions identified in the remainder of the axial or appendicular skeleton. There was stable T8 and L1 compression fractures. There was a right total shoulder arthroplasty with remote appearing fracture below the prosthesis (2020).  M-spikehas been followed (gm/dL): 0.3 on03/26/2019, 0.2 on 06/07/2018,and 0.2 on 06/06/2019.   Shehas stageIIIchronic kidney disease.Creatinineis 1.27 (CrCl 38.4 ml/min) on03/22/2021.  She hasa history ofiron deficiency anemia.Last colonoscopywasin 2012(no report available).Ferritinhas been followed: 66 on06/05/2014, 82 on 07/02/2015, 385 on09/26/2017 and 609 on06/19/2018. Iron saturation was 17% on 03/09/2017. She is not on oral iron.  Bone density on 12/18/2019 revealed osteopenia with a T-score of -2.1 in the right femoral neck.   Symptomatically,  she has felt well.  She notes some numbness in her arm status post fracture.  He is tolerating tamoxifen and Effexor well.  Plan: 1.   Labs today:  LFTs. 2.   Recurrent right breast cancer(2012) She initially had right breast cancer in 2012.                         Sheunderwentlumpectomy and sentinel lymph node biopsy              She receivedaccelerated partial breast radiation and 5 years of Arimidex.             Mammogram on 06/27/2019 revealed right breast distortion.                         Right unilateral breast mammogram on 07/12/2019 revealed a suspicious 2.2 x 0.7 x 1.5 cm irregular hypoechoicmass.             Ultrasound guidedright breast core needle biopsyon 07/21/2019 revealed invasive mammary carcinomawith lobular features.            She underwent right mastectomy and SLN biopsy on 08/09/2019.               Pathology revealed a  4.3 x 2.1 x 2.0 cm grade II invasive lobular carcinoma.                             One of 10 lymph nodes revealed metastatic carcinoma.                           Tumor was ER positive (>90%), PR positive (>90%), and Her2/neu 1+.                            Pathologic stage was mpT2 p N1a.             Mammaprint revealed a low risk luminal-type (A).              She is tolerating tamoxifen well.   Discuss radiation oncology consult.  Patient to stop tamoxifen during weeks she receives radiation.        3. Osteopenia  Bone density on 12/18/2019 confirm osteopenia.  Continue calcium and vitamin D. 4.   JAK2+ thrombocytosis Hematocrit42.5. Hemoglobin13.7. MCV89.9. Platelets383,000. [id number] on 12/11/2019. 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 Original bone marrowreportfrom 2001is unavailable. Patient was previously on hydroxyurea.  Platelet goal is  less than 400,000.  Patient being monitored off hydroxyurea at present. 5. Monoclonalgammopathy of unknown significance(MGUS) M-spikewas 0.2 g/dL on 06/06/2019 and 0 on 12/11/2019. Bone survey on 07/27/2019 revealed:                         There are a few scattered stable lucencies were in the calvarium.                         Stable T8 and L1 compression fractures.                          Right total shoulder arthroplasty with remote fracture below the prosthesis (11/2018). Continue surveillance every 6 months (next 05/2020). 6.   Please call Dr Baruch Gouty to set up simulation and breast radiation (patient will need Lucianne Lei some days). 7.   RTC in 1 month for labs (LFTs). 8.   RTC in 3 months for MD assessment and labs (CBC with diff, CMP, CA27.29).  I discussed the assessment and treatment plan with the patient.  The patient was provided an opportunity to ask questions and all were answered.   The patient agreed with the plan and demonstrated an understanding of the instructions.  The patient was advised to call back if the symptoms worsen or if the condition fails to improve as anticipated.   Lequita Asal, MD, PhD    01/23/2020, 11:25 AM  I, Jacqualyn Posey, am acting as a Education administrator for [employer]. Mike Gip, MD.   I, Providence Stivers C. Mike Gip, MD, have reviewed the above documentation for accuracy and completeness, and I agree with the above.

## 2020-01-23 NOTE — Progress Notes (Signed)
No new changes noted today 

## 2020-02-06 ENCOUNTER — Ambulatory Visit
Admission: RE | Admit: 2020-02-06 | Discharge: 2020-02-06 | Disposition: A | Discharge status: Home / Self Care | Payer: Medicare Other | Source: Ambulatory Visit | Attending: Radiation Oncology | Admitting: Radiation Oncology

## 2020-02-06 DIAGNOSIS — C50811 Malignant neoplasm of overlapping sites of right female breast: Secondary | ICD-10-CM | POA: Insufficient documentation

## 2020-02-06 DIAGNOSIS — Z170 Estrogen receptor positive status [ER+]: Secondary | ICD-10-CM | POA: Diagnosis not present

## 2020-02-06 DIAGNOSIS — Z510 Encounter for antineoplastic radiation therapy: Secondary | ICD-10-CM | POA: Diagnosis not present

## 2020-02-07 ENCOUNTER — Other Ambulatory Visit: Payer: Self-pay | Admitting: *Deleted

## 2020-02-07 DIAGNOSIS — C50811 Malignant neoplasm of overlapping sites of right female breast: Secondary | ICD-10-CM

## 2020-02-09 DIAGNOSIS — Z510 Encounter for antineoplastic radiation therapy: Secondary | ICD-10-CM | POA: Diagnosis not present

## 2020-02-14 ENCOUNTER — Ambulatory Visit: Admission: RE | Admit: 2020-02-14 | Payer: Medicare Other | Source: Ambulatory Visit

## 2020-02-14 DIAGNOSIS — Z510 Encounter for antineoplastic radiation therapy: Secondary | ICD-10-CM | POA: Diagnosis not present

## 2020-02-15 ENCOUNTER — Ambulatory Visit
Admission: RE | Admit: 2020-02-15 | Discharge: 2020-02-15 | Disposition: A | Discharge status: Home / Self Care | Payer: Medicare Other | Source: Ambulatory Visit | Attending: Radiation Oncology | Admitting: Radiation Oncology

## 2020-02-15 DIAGNOSIS — Z510 Encounter for antineoplastic radiation therapy: Secondary | ICD-10-CM | POA: Diagnosis not present

## 2020-02-16 ENCOUNTER — Ambulatory Visit
Admission: RE | Admit: 2020-02-16 | Discharge: 2020-02-16 | Disposition: A | Discharge status: Home / Self Care | Payer: Medicare Other | Source: Ambulatory Visit | Attending: Radiation Oncology | Admitting: Radiation Oncology

## 2020-02-16 DIAGNOSIS — Z510 Encounter for antineoplastic radiation therapy: Secondary | ICD-10-CM | POA: Diagnosis not present

## 2020-02-20 ENCOUNTER — Ambulatory Visit
Admission: RE | Admit: 2020-02-20 | Discharge: 2020-02-20 | Disposition: A | Discharge status: Home / Self Care | Payer: Medicare Other | Source: Ambulatory Visit | Attending: Radiation Oncology | Admitting: Radiation Oncology

## 2020-02-20 DIAGNOSIS — Z170 Estrogen receptor positive status [ER+]: Secondary | ICD-10-CM | POA: Insufficient documentation

## 2020-02-20 DIAGNOSIS — C50811 Malignant neoplasm of overlapping sites of right female breast: Secondary | ICD-10-CM | POA: Diagnosis present

## 2020-02-20 DIAGNOSIS — Z510 Encounter for antineoplastic radiation therapy: Secondary | ICD-10-CM | POA: Diagnosis present

## 2020-02-21 ENCOUNTER — Ambulatory Visit
Admission: RE | Admit: 2020-02-21 | Discharge: 2020-02-21 | Disposition: A | Discharge status: Home / Self Care | Payer: Medicare Other | Source: Ambulatory Visit | Attending: Radiation Oncology | Admitting: Radiation Oncology

## 2020-02-21 DIAGNOSIS — Z510 Encounter for antineoplastic radiation therapy: Secondary | ICD-10-CM | POA: Diagnosis not present

## 2020-02-22 ENCOUNTER — Ambulatory Visit
Admission: RE | Admit: 2020-02-22 | Discharge: 2020-02-22 | Disposition: A | Discharge status: Home / Self Care | Payer: Medicare Other | Source: Ambulatory Visit | Attending: Radiation Oncology | Admitting: Radiation Oncology

## 2020-02-22 DIAGNOSIS — Z510 Encounter for antineoplastic radiation therapy: Secondary | ICD-10-CM | POA: Diagnosis not present

## 2020-02-23 ENCOUNTER — Ambulatory Visit
Admission: RE | Admit: 2020-02-23 | Discharge: 2020-02-23 | Disposition: A | Discharge status: Home / Self Care | Payer: Medicare Other | Source: Ambulatory Visit | Attending: Radiation Oncology | Admitting: Radiation Oncology

## 2020-02-23 ENCOUNTER — Inpatient Hospital Stay: Payer: Medicare Other | Attending: Hematology and Oncology

## 2020-02-23 ENCOUNTER — Other Ambulatory Visit: Payer: Self-pay

## 2020-02-23 DIAGNOSIS — Z9011 Acquired absence of right breast and nipple: Secondary | ICD-10-CM | POA: Insufficient documentation

## 2020-02-23 DIAGNOSIS — C50811 Malignant neoplasm of overlapping sites of right female breast: Secondary | ICD-10-CM | POA: Insufficient documentation

## 2020-02-23 DIAGNOSIS — Z170 Estrogen receptor positive status [ER+]: Secondary | ICD-10-CM | POA: Insufficient documentation

## 2020-02-23 DIAGNOSIS — Z79899 Other long term (current) drug therapy: Secondary | ICD-10-CM | POA: Diagnosis not present

## 2020-02-23 DIAGNOSIS — Z923 Personal history of irradiation: Secondary | ICD-10-CM | POA: Insufficient documentation

## 2020-02-23 DIAGNOSIS — Z510 Encounter for antineoplastic radiation therapy: Secondary | ICD-10-CM | POA: Diagnosis not present

## 2020-02-23 LAB — HEPATIC FUNCTION PANEL
ALT: 63 U/L — ABNORMAL HIGH (ref 0–44)
AST: 54 U/L — ABNORMAL HIGH (ref 15–41)
Albumin: 3.9 g/dL (ref 3.5–5.0)
Alkaline Phosphatase: 107 U/L (ref 38–126)
Bilirubin, Direct: 0.1 mg/dL (ref 0.0–0.2)
Indirect Bilirubin: 0.5 mg/dL (ref 0.3–0.9)
Total Bilirubin: 0.6 mg/dL (ref 0.3–1.2)
Total Protein: 7.6 g/dL (ref 6.5–8.1)

## 2020-02-23 LAB — CBC
HCT: 40.3 % (ref 36.0–46.0)
Hemoglobin: 13.1 g/dL (ref 12.0–15.0)
MCH: 29.4 pg (ref 26.0–34.0)
MCHC: 32.5 g/dL (ref 30.0–36.0)
MCV: 90.4 fL (ref 80.0–100.0)
Platelets: 348 10*3/uL (ref 150–400)
RBC: 4.46 MIL/uL (ref 3.87–5.11)
RDW: 13.9 % (ref 11.5–15.5)
WBC: 9.4 10*3/uL (ref 4.0–10.5)
nRBC: 0 % (ref 0.0–0.2)

## 2020-02-26 ENCOUNTER — Ambulatory Visit
Admission: RE | Admit: 2020-02-26 | Discharge: 2020-02-26 | Disposition: A | Discharge status: Home / Self Care | Payer: Medicare Other | Source: Ambulatory Visit | Attending: Radiation Oncology | Admitting: Radiation Oncology

## 2020-02-26 DIAGNOSIS — Z510 Encounter for antineoplastic radiation therapy: Secondary | ICD-10-CM | POA: Diagnosis not present

## 2020-02-27 ENCOUNTER — Ambulatory Visit
Admission: RE | Admit: 2020-02-27 | Discharge: 2020-02-27 | Disposition: A | Discharge status: Home / Self Care | Payer: Medicare Other | Source: Ambulatory Visit | Attending: Radiation Oncology | Admitting: Radiation Oncology

## 2020-02-27 DIAGNOSIS — Z510 Encounter for antineoplastic radiation therapy: Secondary | ICD-10-CM | POA: Diagnosis not present

## 2020-02-28 ENCOUNTER — Ambulatory Visit
Admission: RE | Admit: 2020-02-28 | Discharge: 2020-02-28 | Disposition: A | Discharge status: Home / Self Care | Payer: Medicare Other | Source: Ambulatory Visit | Attending: Radiation Oncology | Admitting: Radiation Oncology

## 2020-02-28 DIAGNOSIS — Z510 Encounter for antineoplastic radiation therapy: Secondary | ICD-10-CM | POA: Diagnosis not present

## 2020-02-29 ENCOUNTER — Ambulatory Visit
Admission: RE | Admit: 2020-02-29 | Discharge: 2020-02-29 | Disposition: A | Discharge status: Home / Self Care | Payer: Medicare Other | Source: Ambulatory Visit | Attending: Radiation Oncology | Admitting: Radiation Oncology

## 2020-02-29 DIAGNOSIS — Z510 Encounter for antineoplastic radiation therapy: Secondary | ICD-10-CM | POA: Diagnosis not present

## 2020-03-01 ENCOUNTER — Ambulatory Visit
Admission: RE | Admit: 2020-03-01 | Discharge: 2020-03-01 | Disposition: A | Discharge status: Home / Self Care | Payer: Medicare Other | Source: Ambulatory Visit | Attending: Radiation Oncology | Admitting: Radiation Oncology

## 2020-03-01 DIAGNOSIS — Z510 Encounter for antineoplastic radiation therapy: Secondary | ICD-10-CM | POA: Diagnosis not present

## 2020-03-04 ENCOUNTER — Ambulatory Visit
Admission: RE | Admit: 2020-03-04 | Discharge: 2020-03-04 | Disposition: A | Discharge status: Home / Self Care | Payer: Medicare Other | Source: Ambulatory Visit | Attending: Radiation Oncology | Admitting: Radiation Oncology

## 2020-03-04 DIAGNOSIS — Z510 Encounter for antineoplastic radiation therapy: Secondary | ICD-10-CM | POA: Diagnosis not present

## 2020-03-05 ENCOUNTER — Ambulatory Visit
Admission: RE | Admit: 2020-03-05 | Discharge: 2020-03-05 | Disposition: A | Discharge status: Home / Self Care | Payer: Medicare Other | Source: Ambulatory Visit | Attending: Radiation Oncology | Admitting: Radiation Oncology

## 2020-03-05 DIAGNOSIS — Z510 Encounter for antineoplastic radiation therapy: Secondary | ICD-10-CM | POA: Diagnosis not present

## 2020-03-06 ENCOUNTER — Ambulatory Visit
Admission: RE | Admit: 2020-03-06 | Discharge: 2020-03-06 | Disposition: A | Discharge status: Home / Self Care | Payer: Medicare Other | Source: Ambulatory Visit | Attending: Radiation Oncology | Admitting: Radiation Oncology

## 2020-03-06 DIAGNOSIS — Z510 Encounter for antineoplastic radiation therapy: Secondary | ICD-10-CM | POA: Diagnosis not present

## 2020-03-07 ENCOUNTER — Ambulatory Visit
Admission: RE | Admit: 2020-03-07 | Discharge: 2020-03-07 | Disposition: A | Discharge status: Home / Self Care | Payer: Medicare Other | Source: Ambulatory Visit | Attending: Radiation Oncology | Admitting: Radiation Oncology

## 2020-03-07 DIAGNOSIS — Z510 Encounter for antineoplastic radiation therapy: Secondary | ICD-10-CM | POA: Diagnosis not present

## 2020-03-08 ENCOUNTER — Ambulatory Visit
Admission: RE | Admit: 2020-03-08 | Discharge: 2020-03-08 | Disposition: A | Discharge status: Home / Self Care | Payer: Medicare Other | Source: Ambulatory Visit | Attending: Radiation Oncology | Admitting: Radiation Oncology

## 2020-03-08 DIAGNOSIS — Z510 Encounter for antineoplastic radiation therapy: Secondary | ICD-10-CM | POA: Diagnosis not present

## 2020-03-10 ENCOUNTER — Emergency Department: Payer: Medicare Other

## 2020-03-10 ENCOUNTER — Inpatient Hospital Stay (HOSPITAL_COMMUNITY): Payer: Medicare Other | Admitting: Anesthesiology

## 2020-03-10 ENCOUNTER — Encounter (HOSPITAL_COMMUNITY)
Admission: EM | Disposition: A | Discharge status: Home / Self Care | Payer: Self-pay | Source: Other Acute Inpatient Hospital | Attending: Neurology

## 2020-03-10 ENCOUNTER — Other Ambulatory Visit: Payer: Self-pay

## 2020-03-10 ENCOUNTER — Emergency Department (HOSPITAL_COMMUNITY): Payer: Medicare Other

## 2020-03-10 ENCOUNTER — Inpatient Hospital Stay (HOSPITAL_COMMUNITY)
Admission: EM | Admit: 2020-03-10 | Discharge: 2020-03-15 | DRG: 023 | Disposition: A | Discharge status: Skilled Nursing Facility | Payer: Medicare Other | Source: Other Acute Inpatient Hospital | Attending: Neurology | Admitting: Neurology

## 2020-03-10 ENCOUNTER — Emergency Department
Admission: EM | Admit: 2020-03-10 | Discharge: 2020-03-10 | Payer: Medicare Other | Attending: Emergency Medicine | Admitting: Emergency Medicine

## 2020-03-10 DIAGNOSIS — Z7901 Long term (current) use of anticoagulants: Secondary | ICD-10-CM

## 2020-03-10 DIAGNOSIS — I130 Hypertensive heart and chronic kidney disease with heart failure and stage 1 through stage 4 chronic kidney disease, or unspecified chronic kidney disease: Secondary | ICD-10-CM | POA: Insufficient documentation

## 2020-03-10 DIAGNOSIS — R4701 Aphasia: Secondary | ICD-10-CM | POA: Diagnosis present

## 2020-03-10 DIAGNOSIS — D472 Monoclonal gammopathy: Secondary | ICD-10-CM | POA: Diagnosis present

## 2020-03-10 DIAGNOSIS — I639 Cerebral infarction, unspecified: Secondary | ICD-10-CM | POA: Diagnosis present

## 2020-03-10 DIAGNOSIS — I4891 Unspecified atrial fibrillation: Secondary | ICD-10-CM | POA: Insufficient documentation

## 2020-03-10 DIAGNOSIS — H53461 Homonymous bilateral field defects, right side: Secondary | ICD-10-CM | POA: Diagnosis present

## 2020-03-10 DIAGNOSIS — Z7982 Long term (current) use of aspirin: Secondary | ICD-10-CM

## 2020-03-10 DIAGNOSIS — N189 Chronic kidney disease, unspecified: Secondary | ICD-10-CM | POA: Insufficient documentation

## 2020-03-10 DIAGNOSIS — D509 Iron deficiency anemia, unspecified: Secondary | ICD-10-CM | POA: Diagnosis present

## 2020-03-10 DIAGNOSIS — G936 Cerebral edema: Secondary | ICD-10-CM | POA: Diagnosis present

## 2020-03-10 DIAGNOSIS — Z807 Family history of other malignant neoplasms of lymphoid, hematopoietic and related tissues: Secondary | ICD-10-CM

## 2020-03-10 DIAGNOSIS — R4182 Altered mental status, unspecified: Secondary | ICD-10-CM | POA: Diagnosis present

## 2020-03-10 DIAGNOSIS — F419 Anxiety disorder, unspecified: Secondary | ICD-10-CM | POA: Diagnosis present

## 2020-03-10 DIAGNOSIS — F329 Major depressive disorder, single episode, unspecified: Secondary | ICD-10-CM | POA: Diagnosis present

## 2020-03-10 DIAGNOSIS — Y92239 Unspecified place in hospital as the place of occurrence of the external cause: Secondary | ICD-10-CM | POA: Diagnosis present

## 2020-03-10 DIAGNOSIS — Z79899 Other long term (current) drug therapy: Secondary | ICD-10-CM

## 2020-03-10 DIAGNOSIS — R1310 Dysphagia, unspecified: Secondary | ICD-10-CM | POA: Diagnosis present

## 2020-03-10 DIAGNOSIS — Z6833 Body mass index (BMI) 33.0-33.9, adult: Secondary | ICD-10-CM

## 2020-03-10 DIAGNOSIS — Z8673 Personal history of transient ischemic attack (TIA), and cerebral infarction without residual deficits: Secondary | ICD-10-CM | POA: Insufficient documentation

## 2020-03-10 DIAGNOSIS — Z950 Presence of cardiac pacemaker: Secondary | ICD-10-CM

## 2020-03-10 DIAGNOSIS — H547 Unspecified visual loss: Secondary | ICD-10-CM | POA: Diagnosis present

## 2020-03-10 DIAGNOSIS — Z90711 Acquired absence of uterus with remaining cervical stump: Secondary | ICD-10-CM

## 2020-03-10 DIAGNOSIS — Z20822 Contact with and (suspected) exposure to covid-19: Secondary | ICD-10-CM | POA: Insufficient documentation

## 2020-03-10 DIAGNOSIS — Z888 Allergy status to other drugs, medicaments and biological substances status: Secondary | ICD-10-CM | POA: Diagnosis not present

## 2020-03-10 DIAGNOSIS — R29710 NIHSS score 10: Secondary | ICD-10-CM | POA: Diagnosis present

## 2020-03-10 DIAGNOSIS — Z7951 Long term (current) use of inhaled steroids: Secondary | ICD-10-CM

## 2020-03-10 DIAGNOSIS — Z7952 Long term (current) use of systemic steroids: Secondary | ICD-10-CM

## 2020-03-10 DIAGNOSIS — Z87891 Personal history of nicotine dependence: Secondary | ICD-10-CM

## 2020-03-10 DIAGNOSIS — N1831 Chronic kidney disease, stage 3a: Secondary | ICD-10-CM | POA: Diagnosis present

## 2020-03-10 DIAGNOSIS — G8191 Hemiplegia, unspecified affecting right dominant side: Secondary | ICD-10-CM | POA: Diagnosis present

## 2020-03-10 DIAGNOSIS — E039 Hypothyroidism, unspecified: Secondary | ICD-10-CM | POA: Insufficient documentation

## 2020-03-10 DIAGNOSIS — J449 Chronic obstructive pulmonary disease, unspecified: Secondary | ICD-10-CM | POA: Diagnosis present

## 2020-03-10 DIAGNOSIS — I5032 Chronic diastolic (congestive) heart failure: Secondary | ICD-10-CM | POA: Diagnosis present

## 2020-03-10 DIAGNOSIS — K219 Gastro-esophageal reflux disease without esophagitis: Secondary | ICD-10-CM | POA: Diagnosis present

## 2020-03-10 DIAGNOSIS — I6389 Other cerebral infarction: Secondary | ICD-10-CM | POA: Diagnosis not present

## 2020-03-10 DIAGNOSIS — Z803 Family history of malignant neoplasm of breast: Secondary | ICD-10-CM

## 2020-03-10 DIAGNOSIS — Z170 Estrogen receptor positive status [ER+]: Secondary | ICD-10-CM

## 2020-03-10 DIAGNOSIS — I6350 Cerebral infarction due to unspecified occlusion or stenosis of unspecified cerebral artery: Secondary | ICD-10-CM | POA: Insufficient documentation

## 2020-03-10 DIAGNOSIS — I10 Essential (primary) hypertension: Secondary | ICD-10-CM

## 2020-03-10 DIAGNOSIS — I63412 Cerebral infarction due to embolism of left middle cerebral artery: Secondary | ICD-10-CM | POA: Diagnosis present

## 2020-03-10 DIAGNOSIS — I700 Atherosclerosis of aorta: Secondary | ICD-10-CM | POA: Diagnosis present

## 2020-03-10 DIAGNOSIS — Z9282 Status post administration of tPA (rtPA) in a different facility within the last 24 hours prior to admission to current facility: Secondary | ICD-10-CM

## 2020-03-10 DIAGNOSIS — Z800 Family history of malignant neoplasm of digestive organs: Secondary | ICD-10-CM

## 2020-03-10 DIAGNOSIS — T45615A Adverse effect of thrombolytic drugs, initial encounter: Secondary | ICD-10-CM | POA: Diagnosis present

## 2020-03-10 DIAGNOSIS — R32 Unspecified urinary incontinence: Secondary | ICD-10-CM | POA: Diagnosis present

## 2020-03-10 DIAGNOSIS — Z79890 Hormone replacement therapy (postmenopausal): Secondary | ICD-10-CM

## 2020-03-10 DIAGNOSIS — C50811 Malignant neoplasm of overlapping sites of right female breast: Secondary | ICD-10-CM

## 2020-03-10 DIAGNOSIS — E669 Obesity, unspecified: Secondary | ICD-10-CM | POA: Diagnosis present

## 2020-03-10 DIAGNOSIS — E785 Hyperlipidemia, unspecified: Secondary | ICD-10-CM | POA: Diagnosis present

## 2020-03-10 DIAGNOSIS — R791 Abnormal coagulation profile: Secondary | ICD-10-CM | POA: Diagnosis present

## 2020-03-10 DIAGNOSIS — Z953 Presence of xenogenic heart valve: Secondary | ICD-10-CM

## 2020-03-10 DIAGNOSIS — I5033 Acute on chronic diastolic (congestive) heart failure: Secondary | ICD-10-CM | POA: Diagnosis present

## 2020-03-10 DIAGNOSIS — Z96611 Presence of right artificial shoulder joint: Secondary | ICD-10-CM | POA: Diagnosis present

## 2020-03-10 HISTORY — PX: IR US GUIDE VASC ACCESS RIGHT: IMG2390

## 2020-03-10 HISTORY — PX: IR PERCUTANEOUS ART THROMBECTOMY/INFUSION INTRACRANIAL INC DIAG ANGIO: IMG6087

## 2020-03-10 HISTORY — PX: IR CT HEAD LTD: IMG2386

## 2020-03-10 HISTORY — PX: RADIOLOGY WITH ANESTHESIA: SHX6223

## 2020-03-10 LAB — CBC
HCT: 41.8 % (ref 36.0–46.0)
Hemoglobin: 13.9 g/dL (ref 12.0–15.0)
MCH: 29.8 pg (ref 26.0–34.0)
MCHC: 33.3 g/dL (ref 30.0–36.0)
MCV: 89.7 fL (ref 80.0–100.0)
Platelets: 297 10*3/uL (ref 150–400)
RBC: 4.66 MIL/uL (ref 3.87–5.11)
RDW: 14.2 % (ref 11.5–15.5)
WBC: 10.2 10*3/uL (ref 4.0–10.5)
nRBC: 0 % (ref 0.0–0.2)

## 2020-03-10 LAB — COMPREHENSIVE METABOLIC PANEL
ALT: 23 U/L (ref 0–44)
AST: 31 U/L (ref 15–41)
Albumin: 3.9 g/dL (ref 3.5–5.0)
Alkaline Phosphatase: 103 U/L (ref 38–126)
Anion gap: 13 (ref 5–15)
BUN: 23 mg/dL (ref 8–23)
CO2: 26 mmol/L (ref 22–32)
Calcium: 9.7 mg/dL (ref 8.9–10.3)
Chloride: 98 mmol/L (ref 98–111)
Creatinine, Ser: 1.17 mg/dL — ABNORMAL HIGH (ref 0.44–1.00)
GFR calc Af Amer: 53 mL/min — ABNORMAL LOW (ref >60)
GFR calc non Af Amer: 46 mL/min — ABNORMAL LOW (ref >60)
Glucose, Bld: 116 mg/dL — ABNORMAL HIGH (ref 70–99)
Potassium: 4.4 mmol/L (ref 3.5–5.1)
Sodium: 137 mmol/L (ref 135–145)
Total Bilirubin: 0.8 mg/dL (ref 0.3–1.2)
Total Protein: 7.3 g/dL (ref 6.5–8.1)

## 2020-03-10 LAB — DIFFERENTIAL
Abs Immature Granulocytes: 0.04 10*3/uL (ref 0.00–0.07)
Basophils Absolute: 0 10*3/uL (ref 0.0–0.1)
Basophils Relative: 0 %
Eosinophils Absolute: 0.1 10*3/uL (ref 0.0–0.5)
Eosinophils Relative: 1 %
Immature Granulocytes: 0 %
Lymphocytes Relative: 7 %
Lymphs Abs: 0.7 10*3/uL (ref 0.7–4.0)
Monocytes Absolute: 0.5 10*3/uL (ref 0.1–1.0)
Monocytes Relative: 5 %
Neutro Abs: 9 10*3/uL — ABNORMAL HIGH (ref 1.7–7.7)
Neutrophils Relative %: 87 %

## 2020-03-10 LAB — GLUCOSE, CAPILLARY: Glucose-Capillary: 108 mg/dL — ABNORMAL HIGH (ref 70–99)

## 2020-03-10 LAB — SARS CORONAVIRUS 2 BY RT PCR (HOSPITAL ORDER, PERFORMED IN ~~LOC~~ HOSPITAL LAB): SARS Coronavirus 2: NEGATIVE

## 2020-03-10 LAB — MRSA PCR SCREENING: MRSA by PCR: NEGATIVE

## 2020-03-10 LAB — PROTIME-INR
INR: 0.9 (ref 0.8–1.2)
Prothrombin Time: 11.8 seconds (ref 11.4–15.2)

## 2020-03-10 LAB — APTT: aPTT: 27 seconds (ref 24–36)

## 2020-03-10 SURGERY — IR WITH ANESTHESIA
Anesthesia: General

## 2020-03-10 MED ORDER — LIDOCAINE 2% (20 MG/ML) 5 ML SYRINGE
INTRAMUSCULAR | Status: DC | PRN
  Administered 2020-03-10: 40 mg via INTRAVENOUS

## 2020-03-10 MED ORDER — CLEVIDIPINE BUTYRATE 0.5 MG/ML IV EMUL
INTRAVENOUS | Status: DC | PRN
Start: 2020-03-10 — End: 2020-03-10
  Administered 2020-03-10: 2 mg/h via INTRAVENOUS

## 2020-03-10 MED ORDER — EPTIFIBATIDE 20 MG/10ML IV SOLN
INTRAVENOUS | Status: AC
  Filled 2020-03-10: qty 10

## 2020-03-10 MED ORDER — ROCURONIUM BROMIDE 10 MG/ML (PF) SYRINGE
PREFILLED_SYRINGE | INTRAVENOUS | Status: DC | PRN
  Administered 2020-03-10: 50 mg via INTRAVENOUS

## 2020-03-10 MED ORDER — SUCCINYLCHOLINE CHLORIDE 20 MG/ML IJ SOLN
INTRAMUSCULAR | Status: DC | PRN
Start: 2020-03-10 — End: 2020-03-10
  Administered 2020-03-10: 100 mg via INTRAVENOUS

## 2020-03-10 MED ORDER — IOHEXOL 350 MG/ML SOLN
60.0000 mL | Freq: Once | INTRAVENOUS | Status: AC | PRN
  Administered 2020-03-10: 60 mL via INTRAVENOUS

## 2020-03-10 MED ORDER — STROKE: EARLY STAGES OF RECOVERY BOOK
Freq: Once | Status: AC
  Filled 2020-03-10 (×2): qty 1

## 2020-03-10 MED ORDER — TICAGRELOR 90 MG PO TABS
ORAL_TABLET | ORAL | Status: AC
  Filled 2020-03-10: qty 2

## 2020-03-10 MED ORDER — ONDANSETRON HCL 4 MG/2ML IJ SOLN
INTRAMUSCULAR | Status: DC | PRN
  Administered 2020-03-10: 4 mg via INTRAVENOUS

## 2020-03-10 MED ORDER — SENNOSIDES-DOCUSATE SODIUM 8.6-50 MG PO TABS: 1.0000 | ORAL_TABLET | Freq: Every evening | ORAL | Status: DC | PRN

## 2020-03-10 MED ORDER — PANTOPRAZOLE SODIUM 40 MG IV SOLR
40.0000 mg | Freq: Every day | INTRAVENOUS | Status: DC
  Administered 2020-03-10 – 2020-03-12 (×3): 40 mg via INTRAVENOUS
  Filled 2020-03-10 (×3): qty 40

## 2020-03-10 MED ORDER — CLOPIDOGREL BISULFATE 300 MG PO TABS
ORAL_TABLET | ORAL | Status: AC
  Filled 2020-03-10: qty 1

## 2020-03-10 MED ORDER — TIROFIBAN HCL IN NACL 5-0.9 MG/100ML-% IV SOLN
INTRAVENOUS | Status: AC
  Filled 2020-03-10: qty 100

## 2020-03-10 MED ORDER — CLEVIDIPINE BUTYRATE 0.5 MG/ML IV EMUL: 0.0000 mg/h | INTRAVENOUS | Status: DC

## 2020-03-10 MED ORDER — ACETAMINOPHEN 160 MG/5ML PO SOLN: 650.0000 mg | ORAL | Status: DC | PRN

## 2020-03-10 MED ORDER — SODIUM CHLORIDE 0.9 % IV SOLN: 50.0000 mL | Freq: Once | INTRAVENOUS | Status: DC

## 2020-03-10 MED ORDER — IOHEXOL 300 MG/ML  SOLN: 50.0000 mL | Freq: Once | INTRAMUSCULAR | Status: DC | PRN

## 2020-03-10 MED ORDER — SODIUM CHLORIDE 0.9 % IV SOLN: INTRAVENOUS | Status: DC

## 2020-03-10 MED ORDER — ACETAMINOPHEN 650 MG RE SUPP: 650.0000 mg | RECTAL | Status: DC | PRN

## 2020-03-10 MED ORDER — SODIUM CHLORIDE 0.9% FLUSH: 3.0000 mL | Freq: Once | INTRAVENOUS | Status: DC

## 2020-03-10 MED ORDER — PHENYLEPHRINE HCL-NACL 10-0.9 MG/250ML-% IV SOLN
INTRAVENOUS | Status: DC | PRN
Start: 2020-03-10 — End: 2020-03-10
  Administered 2020-03-10: 25 ug/min via INTRAVENOUS

## 2020-03-10 MED ORDER — IOHEXOL 240 MG/ML SOLN
INTRAMUSCULAR | Status: AC
  Filled 2020-03-10: qty 200

## 2020-03-10 MED ORDER — ACETAMINOPHEN 325 MG PO TABS: 650.0000 mg | ORAL_TABLET | ORAL | Status: DC | PRN

## 2020-03-10 MED ORDER — CHLORHEXIDINE GLUCONATE CLOTH 2 % EX PADS
6.0000 | MEDICATED_PAD | Freq: Every day | CUTANEOUS | Status: DC
  Administered 2020-03-11: 6 via TOPICAL

## 2020-03-10 MED ORDER — SUGAMMADEX SODIUM 200 MG/2ML IV SOLN
INTRAVENOUS | Status: DC | PRN
  Administered 2020-03-10: 200 mg via INTRAVENOUS

## 2020-03-10 MED ORDER — SODIUM CHLORIDE 0.9 % IV SOLN
INTRAVENOUS | Status: DC | PRN
Start: 2020-03-10 — End: 2020-03-10

## 2020-03-10 MED ORDER — CLEVIDIPINE BUTYRATE 0.5 MG/ML IV EMUL
INTRAVENOUS | Status: AC
  Filled 2020-03-10: qty 50

## 2020-03-10 MED ORDER — ALTEPLASE (STROKE) FULL DOSE INFUSION
0.9000 mg/kg | Freq: Once | INTRAVENOUS | Status: AC
  Administered 2020-03-10: 74.6 mg via INTRAVENOUS
  Filled 2020-03-10: qty 100

## 2020-03-10 MED ORDER — VERAPAMIL HCL 2.5 MG/ML IV SOLN
INTRAVENOUS | Status: AC
  Filled 2020-03-10: qty 2

## 2020-03-10 MED ORDER — LABETALOL HCL 5 MG/ML IV SOLN: 20.0000 mg | Freq: Once | INTRAVENOUS | Status: DC

## 2020-03-10 MED ORDER — LABETALOL HCL 5 MG/ML IV SOLN
INTRAVENOUS | Status: DC | PRN
Start: 2020-03-10 — End: 2020-03-10
  Administered 2020-03-10 (×2): 5 mg via INTRAVENOUS

## 2020-03-10 MED ORDER — NITROGLYCERIN 1 MG/10 ML FOR IR/CATH LAB
INTRA_ARTERIAL | Status: AC
  Filled 2020-03-10: qty 10

## 2020-03-10 MED ORDER — ASPIRIN 81 MG PO CHEW
CHEWABLE_TABLET | ORAL | Status: AC
  Filled 2020-03-10: qty 1

## 2020-03-10 MED ORDER — SODIUM CHLORIDE 0.9 % IV BOLUS
1000.0000 mL | Freq: Once | INTRAVENOUS | Status: AC
  Administered 2020-03-10: 1000 mL via INTRAVENOUS

## 2020-03-10 MED ORDER — PROPOFOL 10 MG/ML IV BOLUS
INTRAVENOUS | Status: DC | PRN
  Administered 2020-03-10: 30 mg via INTRAVENOUS
  Administered 2020-03-10: 80 mg via INTRAVENOUS

## 2020-03-10 MED ORDER — SODIUM CHLORIDE 0.9 % IV BOLUS: 1000.0000 mL | Freq: Once | INTRAVENOUS | Status: DC

## 2020-03-10 MED ORDER — IOHEXOL 240 MG/ML SOLN: 150.0000 mL | Freq: Once | INTRAMUSCULAR | Status: DC | PRN

## 2020-03-10 NOTE — ED Notes (Signed)
TPA finished

## 2020-03-10 NOTE — ED Provider Notes (Signed)
[facility] Emergency Department Provider Note ____________________________________________   None    (approximate)  I have reviewed the triage vital signs and the nursing notes.   HISTORY  Chief Complaint Altered Mental Status and Aphasia  HPI Adriana Martin is a 74 y.o. female with a history of CHF, A. fib, COPD, and cirrhosis presenting to the emergency department for evaluation after her daughter noticed that she became confused, incontinent, and suddenly had severe aphasia.  Daughter noticed this at 1 PM.  Patient remains aphasic with right-sided deficits and facial droop.      Past Medical History:  Diagnosis Date  . Anemia   . Anxiety   . Atrial fibrillation ([place])   . B12 deficiency   . Breast cancer ([place])    RT Lumpectomy c radiation 2012  . Breast cancer ([place])   . CHF (congestive heart failure) ([place])   . Chronic kidney disease   . Cirrhosis ([place])   . COPD (chronic obstructive pulmonary disease) ([place])   . Depression   . Family history of breast cancer   . Family history of esophageal cancer   . Family history of multiple myeloma   . GERD (gastroesophageal reflux disease)   . Headache   . Heart murmur   . Hyperlipemia   . Hyperlipidemia   . Hypertension   . Hypothyroidism   . Iron (Fe) deficiency anemia   . Personal history of radiation therapy   . Presence of permanent cardiac pacemaker   . Shortness of breath dyspnea   . Thrombocytopenia ([place])   . Vitamin D deficiency     Patient Active Problem List   Diagnosis Date Noted  . Acute ischemic stroke ([place]) 03/10/2020  . Osteopenia 01/21/2020  . Family history of breast cancer   . Family history of multiple myeloma   . Family history of esophageal cancer   . 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/2018  . 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 Carcinoma of overlapping sites of right breast in female, estrogen receptor positive ([place]) 06/16/2016  .  Acute exacerbation of CHF (congestive heart failure) ([place]) 06/07/2016  . A-fib ([place]) 04/08/2016  . COPD exacerbation ([place]) 03/22/2016  . Acute bronchitis 03/22/2016  . Atrial fibrillation with RVR ([place]) 03/22/2016  . Chronic renal insufficiency 03/22/2016  . Cough with expectoration 06/04/2015  . Thrombocytosis ([place]) 02/01/2015  . Anemia 02/01/2015  . Atrial flutter ([place]) 01/23/2015  . Atrial fibrillation ([place]) 01/23/2015  . Other long term (current) drug therapy 11/06/2014  . Chronic diastolic heart failure ([place]) 08/01/2014  . Long term current use of anticoagulant 06/06/2014  . Anxiety 05/13/2014  . Chronic obstructive pulmonary disease ([place]) 03/22/2014  . 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 Avitaminosis D 02/19/2014    Past Surgical History:  Procedure Laterality Date  . ABDOMINAL HYSTERECTOMY     Partial  . ABLATION    . APPENDECTOMY    . BICEPT TENODESIS Right 01/07/2018   Procedure: BICEPS TENODESIS;  Surgeon: Leim Fabry, MD;  Location: [facility] ORS;  Service: Orthopedics;  Laterality: Right;  . BREAST BIOPSY Right 2012  . BREAST BIOPSY Right 07/21/2019   venus clip, [place] Bx, pending path   . BREAST LUMPECTOMY Right 2012   positive/rad  . BREAST SURGERY    . CARDIAC SURGERY    . CARDIAC VALVE REPLACEMENT    . COLONOSCOPY  2012  . ELECTROPHYSIOLOGIC STUDY N/A 01/23/2015   Procedure: CARDIOVERSION;  Surgeon: Corey Skains, MD;  Location: [facility] ORS;  Service: Cardiovascular;  Laterality: N/A;  . ELECTROPHYSIOLOGIC STUDY N/A 05/27/2016   Procedure: CARDIOVERSION;  Surgeon: Corey Skains, MD;  Location: [facility] ORS;  Service: Cardiovascular;  Laterality: N/A;  . ESOPHAGOGASTRODUODENOSCOPY  2012  . EYE SURGERY    . MASTECTOMY WITH AXILLARY LYMPH NODE DISSECTION Right 08/09/2019   Procedure: MASTECTOMY WITH AXILLARY LYMPH NODE DISSECTION;  Surgeon: Robert Bellow, MD;  Location: [facility] ORS;  Service: General;  Laterality: Right;  . MITRAL VALVE REPLACEMENT    . mvp     . REVERSE SHOULDER ARTHROPLASTY Right 01/07/2018   Procedure: REVERSE SHOULDER ARTHROPLASTY;  Surgeon: Leim Fabry, MD;  Location: [facility] ORS;  Service: Orthopedics;  Laterality: Right;  Marland Kitchen VSD REPAIR      Prior to Admission medications   Medication Sig Start Date End Date Taking? Authorizing Provider  acetaminophen (TYLENOL) 500 MG tablet Take 1,000 mg by mouth every 6 (six) hours as needed.    [provider]  ADVAIR DISKUS 100-50 MCG/DOSE AEPB Inhale 1 puff into the lungs 2 (two) times daily.  10/29/14   [provider]  albuterol (VENTOLIN HFA) 108 (90 Base) MCG/ACT inhaler Inhale 2 puffs into the lungs every 6 (six) hours as needed for wheezing or shortness of breath.    [provider]  anastrozole (ARIMIDEX) 1 MG tablet Take 1 tablet (1 mg total) by mouth daily. 03/18/16   Cammie Sickle, MD  aspirin EC 81 MG tablet Take 81 mg by mouth daily.    [provider]  B Complex-C-Zn-Folic Acid (DIALYVITE/ZINC) TABS Take 1 tablet by mouth daily.     [provider]  diltiazem (CARDIZEM CD) 180 MG 24 hr capsule Take 180 mg by mouth daily. 09/25/19   [provider]  ezetimibe (ZETIA) 10 MG tablet Take 10 mg by mouth daily.    [provider]  ferrous sulfate 325 (65 FE) MG tablet Take 325 mg by mouth daily with breakfast.    [provider]  furosemide (LASIX) 40 MG tablet Take 40 mg by mouth daily.  04/03/16   [provider]  HYDROcodone-acetaminophen (NORCO/VICODIN) 5-325 MG tablet Take 1 tablet by mouth every 4 (four) hours as needed for moderate pain. 08/09/19 08/08/20  Robert Bellow, MD  levothyroxine (SYNTHROID, LEVOTHROID) 50 MCG tablet Take 50 mcg by mouth daily before breakfast.    [provider]  lisinopril (PRINIVIL,ZESTRIL) 5 MG tablet Take 5 mg by mouth daily.  10/07/15   [provider]  Multiple Vitamins-Minerals (CENTRUM SILVER ULTRA WOMENS) TABS Take 1 tablet by mouth daily.     [provider]  Multiple Vitamins-Minerals (PRESERVISION AREDS 2 PO) Take 2 capsules by mouth daily.    [provider]  potassium chloride (K-DUR,KLOR-CON) 10 MEQ tablet Take 20 mEq by mouth 2 (two) times daily.     [provider]  pravastatin (PRAVACHOL) 20 MG tablet Take 20 mg by mouth at bedtime.    [provider]  predniSONE (DELTASONE) 5 MG tablet Take 5 mg by mouth daily with breakfast.    [provider]  Probiotic Product (PROBIOTIC DAILY PO) Take by mouth.    [provider]  sertraline (ZOLOFT) 100 MG tablet Take 100 mg by mouth daily.    [provider]  tamoxifen (NOLVADEX) 20 MG tablet Take 1 tablet (20 mg total) by mouth daily. 01/11/20   Lequita Asal, MD  ursodiol (ACTIGALL) 300 MG capsule Take 300 mg by mouth 2 (  two) times daily.     [provider]  venlafaxine XR (EFFEXOR-XR) 37.5 MG 24 hr capsule Take 37.5 mg by mouth daily. 01/14/20   [provider]  Vitamin D, Ergocalciferol, (DRISDOL) 50000 units CAPS capsule Take 50,000 Units by mouth every 14 (fourteen) days.  07/31/15   [provider]    Allergies Atorvastatin, Calcium, Calcium carbonate, Fluoxetine, and Hydrochlorothiazide  Family History  Problem Relation Age of Onset  . Breast cancer Mother        never treated, dx. >50  . Multiple myeloma Father   . Esophageal cancer Brother   . Breast cancer Daughter 47  . Cancer Daughter 42       rare blood cancer    Social History Social History   Tobacco Use  . Smoking status: Former Smoker    Packs/day: 0.25    Years: 30.00    Pack years: 7.50    Types: Cigarettes    Quit date: 10/07/1998    Years since quitting: 21.4  . Smokeless tobacco: Never Used  Vaping Use  . Vaping Use: Never used  Substance Use Topics  . Alcohol use: No    Alcohol/week: 4.0 standard drinks    Types: 4 Cans of beer per week    Comment: socially  . Drug use: No    Review of  Systems  Constitutional: No fever/chills Eyes: No visual changes. ENT: No sore throat. Cardiovascular: Denies chest pain. Respiratory: Denies shortness of breath. Gastrointestinal: No abdominal pain.  No nausea, no vomiting.  No diarrhea.  No constipation. Genitourinary: Negative for dysuria. Musculoskeletal: Negative for back pain. Skin: Negative for rash. Neurological: Positive for right side weakness. ____________________________________________   PHYSICAL EXAM:  VITAL SIGNS: ED Triage Vitals  Enc Vitals Group     BP 03/10/20 1443 (!) 141/70     Pulse Rate 03/10/20 1443 80     Resp 03/10/20 1443 16     Temp 03/10/20 1443 98.7 F (37.1 C)     Temp Source 03/10/20 1443 Oral     SpO2 03/10/20 1443 95 %     Weight 03/10/20 1502 204 lb 12.9 oz (92.9 kg)     Height 03/10/20 1502 _0  (1.575 m)     Head Circumference --      Peak Flow --      Pain Score 03/10/20 1502 0     Pain Loc --      Pain Edu? --      Excl. in [place]? --     Constitutional: Alert. Acutely ill appearing and in no acute distress. Eyes: Conjunctivae are normal. Head: Atraumatic. Nose: No congestion/rhinnorhea. Mouth/Throat: Mucous membranes are moist.  Oropharynx non-erythematous. Neck: No stridor.   Hematological/Lymphatic/Immunilogical: No cervical lymphadenopathy. Cardiovascular: Normal rate, regular rhythm. Grossly normal heart sounds.  Good peripheral circulation. Respiratory: Normal respiratory effort.  No retractions. Lungs CTAB. Gastrointestinal: Soft and nontender. No distention. No abdominal bruits. No CVA tenderness. Genitourinary:  Musculoskeletal: No lower extremity tenderness nor edema. Neurologic: aphasic; confused; right side weakness with pronator drift; unequal grip on the right side; unable to perform straight leg raise on exam.  Skin:  Skin is warm, dry and intact. No rash noted. Psychiatric: Mood and affect are normal. Speech and behavior are  normal.  ____________________________________________   LABS (all labs ordered are listed, but only abnormal results are displayed)  Labs Reviewed  DIFFERENTIAL - Abnormal; Notable for the following components:      Result Value   Neutro Abs  9.0 (*)    All other components within normal limits  COMPREHENSIVE METABOLIC PANEL - Abnormal; Notable for the following components:   Glucose, Bld 116 (*)    Creatinine, Ser 1.17 (*)    GFR calc non Af Amer 46 (*)    GFR calc Af Amer 53 (*)    All other components within normal limits  GLUCOSE, CAPILLARY - Abnormal; Notable for the following components:   Glucose-Capillary 108 (*)    All other components within normal limits  SARS CORONAVIRUS 2 BY RT PCR (HOSPITAL ORDER, [facility])  PROTIME-INR  APTT  CBC  I-STAT CREATININE, ED  CBG MONITORING, ED   ____________________________________________  EKG  ED ECG REPORT I, Leeanna Slaby, FNP-BC personally viewed and interpreted this ECG.   Date: 03/10/2020  EKG Time: 1507  Rate: 92  Rhythm: atrial fibrillation, rate 92  Axis: normal  Intervals:none  ST&T Change: no ST elevation  ____________________________________________  RADIOLOGY  ED MD interpretation:    Head CT for stroke shows a hyperdense MCA sign with a left MCA infarct  CT angio head shows a proximal left M2 inferior division occlusion.  No ICH.  Aspects is 7.  I, Sherrie George, personally viewed and evaluated these images (plain radiographs) as part of my medical decision making, as well as reviewing the written report by the radiologist.  Official radiology report(s): CT HEAD CODE STROKE WO CONTRAST`  Result Date: 03/10/2020 CLINICAL DATA:  Code stroke.  Expressive aphasia. EXAM: CT HEAD WITHOUT CONTRAST TECHNIQUE: Contiguous axial images were obtained from the base of the skull through the vertex without intravenous contrast. COMPARISON:  11/03/2018 FINDINGS: Brain: There is loss of  gray-white differentiation in the posterior left insula, posterior left temporal lobe, and left parietal lobe consistent with an acute infarct. No intracranial hemorrhage, mass, midline shift, or extra-axial fluid collection is identified. A small chronic left occipital infarct in the PCA territory is new from the prior CT. A small chronic right cerebellar infarct is unchanged. Hypodensities in the cerebral white matter bilaterally are similar to the prior CT and nonspecific but compatible with mild chronic small vessel ischemic disease. Mild cerebral atrophy is within normal limits for age. Vascular: Calcified atherosclerosis at the skull base. Hyperdense left MCA in the distal M1/bifurcation region. Skull: The visualized paranasal sinus that no fracture or suspicious osseous lesion. Sinuses/Orbits: Visualized paranasal sinuses and mastoid air cells are clear. Bilateral cataract extraction. Other: None. ASPECTS [facility] Stroke Program Early CT Score) - Ganglionic level infarction (caudate, lentiform nuclei, internal capsule, insula, M1-M3 cortex): 5 - Supraganglionic infarction (M4-M6 cortex): 2 Total score (0-10 with 10 being normal): 7 IMPRESSION: 1. Acute left MCA infarct with hyperdense left MCA. No intracranial hemorrhage. 2. ASPECTS is 7. These results were called by telephone at the time of interpretation on 03/10/2020 at 3:19 pm to [facility], who verbally acknowledged these results. Electronically Signed   By: Logan Bores M.D.   On: 03/10/2020 15:21   CT ANGIO HEAD CODE STROKE  Result Date: 03/10/2020 CLINICAL DATA:  Aphasia. EXAM: CT ANGIOGRAPHY HEAD AND NECK TECHNIQUE: Multidetector CT imaging of the head and neck was performed using the standard protocol during bolus administration of intravenous contrast. Multiplanar CT image reconstructions and MIPs were obtained to evaluate the vascular anatomy. Carotid stenosis measurements (when applicable) are obtained utilizing NASCET criteria, using the  distal internal carotid diameter as the denominator. CONTRAST:  39m OMNIPAQUE IOHEXOL 350 MG/ML SOLN COMPARISON:  None. FINDINGS: CTA NECK  FINDINGS Aortic arch: Standard 3 vessel aortic arch with mild atherosclerotic plaque. No significant arch vessel origin stenosis. Right carotid system: Patent without evidence of stenosis, dissection, or significant atherosclerosis. Retropharyngeal course of the proximal ICA. Left carotid system: Patent with minimal calcified plaque at the carotid bifurcation. No evidence of stenosis or dissection. Retropharyngeal course of the proximal ICA. Vertebral arteries: Patent without evidence of stenosis or dissection. Mildly dominant left vertebral artery. Skeleton: Asymmetrically advanced facet arthrosis on the left at C2-3 and C3-4 with associated grade 1 anterolisthesis. Moderate to severe multilevel cervical disc degeneration. Other neck: No evidence of cervical lymphadenopathy or mass. Upper chest: Respiratory motion artifact without apical lung consolidation or mass. Partially visualized left subclavian approach pacemaker. Review of the MIP images confirms the above findings CTA HEAD FINDINGS Anterior circulation: The internal carotid arteries are patent from skull base to carotid termini with minimal nonstenotic plaque bilaterally. The left M1 segment is widely patent, however there is occlusion of the dominant left M2 inferior division just beyond the bifurcation with mild reconstitution of distal branch vessels. The right MCA and both ACAs are patent without evidence of a proximal branch occlusion or significant proximal stenosis. No aneurysm is identified. Posterior circulation: The intracranial vertebral arteries are widely patent to the basilar. Patent right PICA, left AICA, and bilateral SCAs are seen. The basilar artery is widely patent. There are diminutive posterior communicating arteries. Both PCAs are patent without evidence of a significant proximal stenosis, however  there is a severe distal P2 stenosis on the left. No aneurysm is identified. Venous sinuses: Patent. Anatomic variants: None. Review of the MIP images confirms the above findings IMPRESSION: 1. Proximal left M2 inferior division occlusion. 2. Severe distal left P2 stenosis. 3. Widely patent carotid and vertebral arteries. 4.  Aortic Atherosclerosis (ICD10-I70.0). These results were called by telephone at the time of interpretation on 03/10/2020 at 4:24 pm to Dr. Ruffin Frederick, who verbally acknowledged these results. Electronically Signed   By: Logan Bores M.D.   On: 03/10/2020 16:40   CT ANGIO NECK CODE STROKE  Result Date: 03/10/2020 CLINICAL DATA:  Aphasia. EXAM: CT ANGIOGRAPHY HEAD AND NECK TECHNIQUE: Multidetector CT imaging of the head and neck was performed using the standard protocol during bolus administration of intravenous contrast. Multiplanar CT image reconstructions and MIPs were obtained to evaluate the vascular anatomy. Carotid stenosis measurements (when applicable) are obtained utilizing NASCET criteria, using the distal internal carotid diameter as the denominator. CONTRAST:  72m OMNIPAQUE IOHEXOL 350 MG/ML SOLN COMPARISON:  None. FINDINGS: CTA NECK FINDINGS Aortic arch: Standard 3 vessel aortic arch with mild atherosclerotic plaque. No significant arch vessel origin stenosis. Right carotid system: Patent without evidence of stenosis, dissection, or significant atherosclerosis. Retropharyngeal course of the proximal ICA. Left carotid system: Patent with minimal calcified plaque at the carotid bifurcation. No evidence of stenosis or dissection. Retropharyngeal course of the proximal ICA. Vertebral arteries: Patent without evidence of stenosis or dissection. Mildly dominant left vertebral artery. Skeleton: Asymmetrically advanced facet arthrosis on the left at C2-3 and C3-4 with associated grade 1 anterolisthesis. Moderate to severe multilevel cervical disc degeneration. Other neck: No evidence  of cervical lymphadenopathy or mass. Upper chest: Respiratory motion artifact without apical lung consolidation or mass. Partially visualized left subclavian approach pacemaker. Review of the MIP images confirms the above findings CTA HEAD FINDINGS Anterior circulation: The internal carotid arteries are patent from skull base to carotid termini with minimal nonstenotic plaque bilaterally. The left M1 segment is widely patent, however  there is occlusion of the dominant left M2 inferior division just beyond the bifurcation with mild reconstitution of distal branch vessels. The right MCA and both ACAs are patent without evidence of a proximal branch occlusion or significant proximal stenosis. No aneurysm is identified. Posterior circulation: The intracranial vertebral arteries are widely patent to the basilar. Patent right PICA, left AICA, and bilateral SCAs are seen. The basilar artery is widely patent. There are diminutive posterior communicating arteries. Both PCAs are patent without evidence of a significant proximal stenosis, however there is a severe distal P2 stenosis on the left. No aneurysm is identified. Venous sinuses: Patent. Anatomic variants: None. Review of the MIP images confirms the above findings IMPRESSION: 1. Proximal left M2 inferior division occlusion. 2. Severe distal left P2 stenosis. 3. Widely patent carotid and vertebral arteries. 4.  Aortic Atherosclerosis (ICD10-I70.0). These results were called by telephone at the time of interpretation on 03/10/2020 at 4:24 pm to Dr. Ruffin Frederick, who verbally acknowledged these results. Electronically Signed   By: Logan Bores M.D.   On: 03/10/2020 16:40    ____________________________________________   PROCEDURES  Procedure(s) performed (including Critical Care):  Procedures   CRITICAL CARE Performed by: Sherrie George   Total critical care time: 30 minutes  Critical care time was exclusive of separately billable procedures and treating  other patients.  Critical care was necessary to treat or prevent imminent or life-threatening deterioration.  Critical care was time spent personally by me on the following activities: development of treatment plan with patient and/or surrogate as well as nursing, discussions with consultants, evaluation of patient's response to treatment, examination of patient, obtaining history from patient or surrogate, ordering and performing treatments and interventions, ordering and review of laboratory studies, ordering and review of radiographic studies, pulse oximetry and re-evaluation of patient's condition. c  ____________________________________________   INITIAL IMPRESSION / ASSESSMENT AND PLAN     75 year old female presents to the emergency department for treatment and evaluation of altered mental status and sudden aphasia with right-sided weakness.  See HPI for further details.  DIFFERENTIAL DIAGNOSIS  CVA, TIA  ED COURSE  Code stroke initiated upon triage.  Patient has already been to CT.  Dr. Irish Elders with neurology recommends using the telemetry neuro cart for exam.  Order has been placed for telemetry neuro consult.  Patient is awake, alert, and without respiratory distress.  Teleneurologist recommends TPA.  Awaiting lab results to begin administration.  Pharmacy staff at bedside.  Patient continues to experience aphasia and right-sided weakness.  Daughter is at bedside helping to answer the questions for the teleneurologist.  About 30 minutes after TPA administration, the patient's aphasia had lessened.  She still has some expressive aphasia but is better able to communicate.  CTA head and neck have been ordered.  CTA head shows a proximal left M2 inferior division occlusion.  Working with [facility] to get the patient transferred to [facility].  Dr. Ellender Hose is also involved in the transfer.  Transfer form completed.  Patient's vital signs remained stable however she is now a bit more  aphasic than on last evaluation. [facility] to arrive in about 5 minutes. Vital signs remain stable.  ____________________________________________   FINAL CLINICAL IMPRESSION(S) / ED DIAGNOSES  Final diagnoses:  Stroke (cerebrum) [facility])     ED Discharge Orders    None       Adriana Martin was evaluated in Emergency Department on 03/10/2020 for the symptoms described in the history of present illness. She was evaluated  in the context of the global COVID-19 pandemic, which necessitated consideration that the patient might be at risk for infection with the SARS-CoV-2 virus that causes COVID-19. Institutional protocols and algorithms that pertain to the evaluation of patients at risk for COVID-19 are in a state of rapid change based on information released by regulatory bodies including the CDC and federal and state organizations. These policies and algorithms were followed during the patient's care in the ED.   Note:  This document was prepared using Dragon voice recognition software and may include unintentional dictation errors.   Victorino Dike, FNP 03/10/20 2031    Duffy Bruce, MD 03/17/20 1547

## 2020-03-10 NOTE — Anesthesia Postprocedure Evaluation (Addendum)
Anesthesia Post Note  Patient: Adriana Martin  Procedure(s) Performed: IR WITH ANESTHESIA (N/A )     Patient location during evaluation: ICU Anesthesia Type: General Level of consciousness: awake Pain management: pain level controlled Vital Signs Assessment: post-procedure vital signs reviewed and stable Respiratory status: spontaneous breathing, nonlabored ventilation, respiratory function stable and patient connected to nasal cannula oxygen Cardiovascular status: blood pressure returned to baseline and stable Postop Assessment: no apparent nausea or vomiting Anesthetic complications: no   No complications documented.  Last Vitals:  Vitals:   03/10/20 2130 03/10/20 2200  BP: 111/74 119/65  Pulse: 80 80  Resp: 14 15  Temp:    SpO2: 98% 99%    Last Pain:  Vitals:   03/10/20 2038  TempSrc: Oral  PainSc:                  [place]

## 2020-03-10 NOTE — ED Triage Notes (Signed)
Pt here with daughter who states a little after 1pm today noticed that pt had expressive apahia, was mumbling, confused, with urinary incontinent.   Expressive aphasia noted in triage.

## 2020-03-10 NOTE — ED Notes (Addendum)
Pt is going to be transported to IR at [facility] per Neurologist  Dr Myrene Buddy in room and aware of transfer Pt also has hypotension BP in 90's - Per neurologist NACL 1029ml fluid bolus

## 2020-03-10 NOTE — ED Notes (Signed)
[facility] arrived to ED

## 2020-03-10 NOTE — H&P (Signed)
Stroke H&P  CC: Acute aphasia, right-sided weakness and incontinence  History is obtained from: Chart review  HPI: Adriana Martin is a 74 y.o. female past medical history of recurrent breast cancer, she is undergoing radiation with last dose 03/08/2020, primary biliary cirrhosis, MGUS, A. 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 Last normal 1 PM per her daughter that was at bedside. Presented to [facility] hospital.  Evaluated by telemedicine neurology. NIH stroke scale at [place] 10 due to profound aphasia and right upper extremity greater than lower extremity weakness. CT head with aspects 7 with hypoattenuation of the left MCA and hyperdense distal left M1 segment. Telemedicine neurologist confirmed with the radiologist that hypodensities are not consistent with [facility] or actual ischemia. CTA performed that showed proximal right M2 inferior division occlusion. IV tPA given at 1547 hrs. at [place] discussed the case with Dr. Ladean Raya over the phone. After that they called [facility] to contact me and request admission and transfer. I accepted the patient to the IR suite.   LKW: 1:30 PM on 03/10/2020 tpa given?:  Yes at the outside hospital Premorbid modified Rankin scale (mRS):2 per family provided information  ROS: Unable to obtain due to altered mental status/aphasia  Past Medical History:  Diagnosis Date  . Anemia   . Anxiety   . Atrial fibrillation ([place])   . B12 deficiency   . Breast cancer ([place])    RT Lumpectomy c radiation 2012  . Breast cancer ([place])   . CHF (congestive heart failure) ([place])   . Chronic kidney disease   . Cirrhosis ([place])   . COPD (chronic obstructive pulmonary disease) ([place])   . Depression   . Family history of breast cancer    . Family history of esophageal cancer   . Family history of multiple myeloma   . GERD (gastroesophageal reflux disease)   . Headache   . Heart murmur   . Hyperlipemia   . Hyperlipidemia   . Hypertension   . Hypothyroidism   . Iron (Fe) deficiency anemia   . Personal history of radiation therapy   . Presence of permanent cardiac pacemaker   . Shortness of breath dyspnea   . Thrombocytopenia ([place])   . Vitamin D deficiency    Family History  Problem Relation Age of Onset  . Breast cancer Mother        never treated, dx. >50  . Multiple myeloma Father   . Esophageal cancer Brother   . Breast cancer Daughter 4  . Cancer Daughter 41       rare blood cancer   social History:   reports that she quit smoking about 21 years ago. Her smoking use included cigarettes. She has a 7.50 pack-year smoking history. She has never used smokeless tobacco. She reports that she does not drink alcohol and does not use drugs.  Medications  Current Facility-Administered Medications:  .   stroke: mapping our early stages of recovery book, , Does not apply, Once, Amie Portland, MD .  0.9 %  sodium chloride infusion, , Intravenous, Continuous, Amie Portland, MD .  acetaminophen (TYLENOL) tablet 650 mg, 650 mg, Oral, Q4H PRN **OR** acetaminophen (TYLENOL) 160 MG/5ML solution 650 mg, 650 mg, Per Tube, Q4H PRN **OR** acetaminophen (TYLENOL) suppository 650 mg, 650 mg, Rectal, Q4H PRN, Amie Portland, MD .  labetalol (NORMODYNE) injection 20  mg, 20 mg, Intravenous, Once **AND** clevidipine (CLEVIPREX) infusion 0.5 mg/mL, 0-21 mg/hr, Intravenous, Continuous, Amie Portland, MD .  nitroGLYCERIN 100 mcg/mL intra-arterial injection, , , ,  .  pantoprazole (PROTONIX) injection 40 mg, 40 mg, Intravenous, QHS, Amie Portland, MD .  senna-docusate (Senokot-S) tablet 1 tablet, 1 tablet, Oral, QHS PRN, Amie Portland, MD  Facility-Administered Medications Ordered in Other Encounters:  .  aspirin 81 MG chewable tablet, ,  , ,  .  clopidogrel (PLAVIX) 300 MG tablet, , , ,  .  eptifibatide (INTEGRILIN) 20 MG/10ML injection, , , ,  .  iohexol (OMNIPAQUE) 240 MG/ML injection, , , ,  .  ticagrelor (BRILINTA) 90 MG tablet, , , ,  .  tirofiban (AGGRASTAT) 5-0.9 MG/100ML-% injection, , , ,  .  verapamil (ISOPTIN) 2.5 MG/ML injection, , , ,   Exam: Current vital signs: There were no vitals taken for this visit. Vital signs in last 24 hours: Temp:  [98.4 F (36.9 C)-98.7 F (37.1 C)] 98.4 F (36.9 C) (06/20 1659) Pulse Rate:  [78-94] 81 (06/20 1711) Resp:  [10-23] 17 (06/20 1711) BP: (85-143)/(47-106) 120/67 (06/20 1711) SpO2:  [92 %-100 %] 94 % (06/20 1711) Weight:  [82.9 kg-92.9 kg] 82.9 kg (06/20 1520) General: Awake alert in no distress Agent: Normocephalic atraumatic Lungs: Clear to auscultation Cardiovascular: Irregularly irregular Extremities warm well perfused Neurological exam Awake alert oriented to self. Unable to say the correct month. Unable to tell her correct age Difficulty naming objects Speech is mildly dysarthric Follows some simple commands. Cranial nerves: Pupils equal round reactive light, extraocular movement examination reveals left gaze preference, but she can come up to the midline but does not cross the midline to look at the right and neglects the right visual fields as well, and there is also right homonymous hemianopsia, facial symmetry is restricted with right nasolabial fold/smile asymmetry. Motor exam: Right upper and lower extremity are mildly weaker than the left but there is no gross drift at this time. Sensory exam: There is diminished sensation on the right Coordination: No obvious ataxia NIH stroke scale 1a Level of Conscious.: 0 1b LOC Questions: 2 1c LOC Commands: 1 2 Best Gaze: 1 3 Visual: 2 4 Facial Palsy: 1 5a Motor Arm - left: 0 5b Motor Arm - Right: 0 6a Motor Leg - Left: 0 6b Motor Leg - Right: 0 7 Limb Ataxia: 0 8 Sensory: 1 9 Best Language: 2 10  Dysarthria: 1 11 Extinct. and Inatten.: 2 TOTAL: 13  Labs I have reviewed labs in epic and the results pertinent to this consultation are:   CBC    Component Value Date/Time   WBC 10.2 03/10/2020 1509   RBC 4.66 03/10/2020 1509   HGB 13.9 03/10/2020 1509   HGB 11.1 (L) 12/30/2014 0921   HCT 41.8 03/10/2020 1509   HCT 34.4 (L) 12/30/2014 0921   PLT 297 03/10/2020 1509   PLT 482 (H) 12/30/2014 0921   MCV 89.7 03/10/2020 1509   MCV 88 12/30/2014 0921   MCH 29.8 03/10/2020 1509   MCHC 33.3 03/10/2020 1509   RDW 14.2 03/10/2020 1509   RDW 19.2 (H) 12/30/2014 0921   LYMPHSABS 0.7 03/10/2020 1509   LYMPHSABS 0.7 (L) 09/25/2014 1437   MONOABS 0.5 03/10/2020 1509   MONOABS 0.3 09/25/2014 1437   EOSABS 0.1 03/10/2020 1509   EOSABS 0.2 09/25/2014 1437   BASOSABS 0.0 03/10/2020 1509   BASOSABS 0.1 09/25/2014 1437    CMP     Component  Value Date/Time   NA 137 03/10/2020 1509   NA 129 (L) 12/30/2014 0921   K 4.4 03/10/2020 1509   K 4.7 12/30/2014 0921   CL 98 03/10/2020 1509   CL 91 (L) 12/30/2014 0921   CO2 26 03/10/2020 1509   CO2 26 12/30/2014 0921   GLUCOSE 116 (H) 03/10/2020 1509   GLUCOSE 103 (H) 12/30/2014 0921   BUN 23 03/10/2020 1509   BUN 22 (H) 12/30/2014 0921   CREATININE 1.17 (H) 03/10/2020 1509   CREATININE 1.22 (H) 12/30/2014 0921   CALCIUM 9.7 03/10/2020 1509   CALCIUM 9.1 12/30/2014 0921   PROT 7.3 03/10/2020 1509   PROT 7.9 12/30/2014 0921   ALBUMIN 3.9 03/10/2020 1509   ALBUMIN 4.0 12/30/2014 0921   AST 31 03/10/2020 1509   AST 31 12/30/2014 0921   ALT 23 03/10/2020 1509   ALT 29 12/30/2014 0921   ALKPHOS 103 03/10/2020 1509   ALKPHOS 101 12/30/2014 0921   BILITOT 0.8 03/10/2020 1509   BILITOT 0.5 12/30/2014 0921   GFRNONAA 46 (L) 03/10/2020 1509   GFRNONAA 45 (L) 12/30/2014 0921   GFRAA 53 (L) 03/10/2020 1509   GFRAA 52 (L) 12/30/2014 5465    Imaging I have reviewed the images obtained: CT-scan of the brain-aspect 7.  No bleed CTA:  Occluded proximal left M2 inferior division.  Severe distal left P2 stenosis.  Assessment:  74 year old woman past history of recurrent breast cancer undergoing radiation-last radiation 03/08/2020, primary biliary cirrhosis, MGUS, A. fib on aspirin-anticoagulation has been discontinued due to fall risk, right shoulder limited ROM due to arthropathy, hypertension, hyperlipidemia, CKD, COPD presented with acute onset of aphasia and right-sided weakness. CT head consistent with a left MCA stroke-aspect score 7. CTA consistent with occluded proximal left M2 inferior division. Telemedicine neurology evaluated patient and gave TPA at [facility]. Discussed with interventional radiology, brought in for mechanical thrombectomy. Consent obtained over the phone from [place] over the phone-witnessed by rapid RN.  Impression: Acute ischemic stroke-secondary to cardioembolic etiology from A. fib versus hypercoagulability due to cancer.  Other diagnoses: Breast cancer MGUS Atrial fibrillation Hypertension Hyperlipidemia CKD-3 COPD  Plan: Acute Ischemic Stroke Cerebral infarction due to embolism of left middle cerebral artery   Acuity: Acute Current Suspected Etiology: Cardioembolic versus hypercoagulability from cancer Continue Evaluation:  -Admit to: Neuro ICU -Hold Aspirin until 24 hour post tPA neuroimaging is stable and without evidence of bleeding -Blood pressure control, goal of SYS <180 per TPA protocol.  If revascularization is successful, blood pressure goals will be systolic blood pressure of between 120-140. -MRI/ECHO/A1C/Lipid panel. -Hyperglycemia management per SSI to maintain glucose 140-151m/dL. -PT/OT/ST therapies and recommendations when able  CNS -Close neuro monitoring  Dysarthria Dysphagia following cerebral infarction  -NPO until cleared by bedside swallow/speech evaluation -ST -May need PEG  Hemiplegia and hemiparesis following cerebral  infarction affecting right dominant side -PT/OT -PM&R consult  RESP Intubated for procedure History of COPD Chest x-ray   CV Atrial fibrillation Essential hypertension  Rate controlled Blood pressure goals as above Use as needed labetalol and Cleviprex.   Hyperlipidemia, unspecified  - Statin for goal LDL < 70  Chronic atrial fibrillation -Rate control  HEME/Onc Iron Deficiency Anemia Blood Loss Anemia Anemia in Chronic Diseases -Monitor -transfuse for hgb < 7  Breast cancer/recurrent -On chemotherapy -Obtain records and possible oncology consultation -Consider obtaining palliative care consultation as well   ENDO Check A1c -goal HgbA1c < 7  GI/GU CKD Stage 3 (GFR 30-59) -Gentle hydration -avoid  nephrotoxic agents  Fluid/Electrolyte Disorders -Check labs -Replete as necessary  ID Possible Aspiration PNA -CXR -NPO -Monitor  Prophylaxis DVT: SCD GI: PPI Bowel: Doc senna  Code Status: Full Code    THE FOLLOWING WERE PRESENT ON ADMISSION: Acute ischemic stroke Cerebral edema due to stroke Breast Ca Coagulopathy due to TPA given at outside hospital Possible aspiration pneumonia Atrial fibrillation   Consent obtained by Dr. Christophe Louis the phone Update provided to daughter Darrel Baroni over the phone. Both the numbers are listed in the chart under the additional information patient contact section.   -- Amie Portland, MD [facility] Pager: [phone] If 7pm to 7am, please call on call as listed on AMION.   CRITICAL CARE ATTESTATION Performed by: Amie Portland, MD Total critical care time: 32 minutes Critical care time was exclusive of separately billable procedures and treating other patients and/or supervising APPs/Residents/Students Critical care was necessary to treat or prevent imminent or life-threatening deterioration due to acute ischemic stroke This patient is critically ill and at significant risk for  neurological worsening and/or death and care requires constant monitoring. Critical care was time spent personally by me on the following activities: development of treatment plan with patient and/or surrogate as well as nursing, discussions with consultants, evaluation of patient's response to treatment, examination of patient, obtaining history from patient or surrogate, ordering and performing treatments and interventions, ordering and review of laboratory studies, ordering and review of radiographic studies, pulse oximetry, re-evaluation of patient's condition, participation in multidisciplinary rounds and medical decision making of high complexity in the care of this patient.

## 2020-03-10 NOTE — ED Notes (Signed)
Per pt daughter the pt had been normal today and interacting with family as usual until 1pm when she became confused, incontinent, and severe aphasia - the family brought her to the ED for eval and treatment

## 2020-03-10 NOTE — ED Notes (Signed)
TPA started.  

## 2020-03-10 NOTE — Transfer of Care (Signed)
Immediate Anesthesia Transfer of Care Note  Patient: Adriana Martin  Procedure(s) Performed: IR WITH ANESTHESIA (N/A )  Patient Location: ICU  Anesthesia Type:General  Level of Consciousness: awake, alert  and responds to stimulation  Airway & Oxygen Therapy: Patient Spontanous Breathing and Patient connected to face mask oxygen  Post-op Assessment: Report given to RN, Post -op Vital signs reviewed and stable and Patient moving all extremities X 4  Post vital signs: Reviewed and stable  Last Vitals:  Vitals Value Taken Time  BP 124/79 03/10/20 1917  Temp    Pulse 86 03/10/20 1919  Resp 12 03/10/20 1919  SpO2 99 % 03/10/20 1919  Vitals shown include unvalidated device data.  Last Pain:  Vitals:   03/10/20 1910  PainSc: (P) 0-No pain         Complications: No complications documented.

## 2020-03-10 NOTE — Consult Note (Addendum)
[facility] TeleNeurology Consult Services   Date of Service:   03/10/2020 15:10:13  Impression:       M57.846 - Cerebrovascular accident (CVA) due to occulsion of left middle cereberal artery [facility])  Comments/Sign-Out: Patient is a 74 yo RH female with a PMH of recurrent right breast cancer for which she is undergoing radiation therapy (last dose 03/08/20), primary biliary cirrhosis, MGUS, Afib on ASA (AC discontinued about 2 years ago due to fall risk), right shoulder limited ROM due to arthropathy, HTN, HLD, CKD, COPD who p/w acute onset aphasia, right sided weakness with associated incontinence. LNK 13:00, per her daughter Venida Jarvis at bedside. On exam she has an NIHSS of 10, with profound global aphasia, Right UE >RLE weakness, and likely right sided neglect. CTH revealed an ASPECTS of 7, with hypoattenutaion in the left MCA distribution with hyperdense distal left M1 segment. Radiologist called to review images to confirm findings were definitively c/w acute ischemia as opposed to multifocal metastases. Per radiologist no e/o intracranial mets, changes are due to acute ischemia.. Alteplase was recommended given disabling deficits on exam, including global aphasia and weakness of her dominant side. No absolute or relative contraindications reported by patients daughter. Given h/o Cirrhosis labs were reviewed prior to administration of Alteplase. Alteplase given at 96:29, without complication to date. Mild improvement in aphasia and RUE strength following bolus. CTA head and neck showed a proximal right M2 inferior branch occlusion. CTP was unable to be performed, unable to place 18g. Case d/w Neurology and NIR physicians at [facility], she has been accepted for NIR. Her daughter is aware and agreeable with possible intervention.  Metrics: Last Known Well: 03/10/2020 13:00 [facility] Notification Time: 03/10/2020 15:10:13 Arrival Time: 03/10/2020 14:17:00 Stamp Time:  03/10/2020 15:10:13 Time First Login Attempt: 03/10/2020 15:11:00 Symptoms: aphasia, right sided weakness NIHSS Start Assessment Time: 03/10/2020 15:15:00 Alteplase Early Mix Decision Time: 03/10/2020 15:37:00 Patient is a candidate for Alteplase/Activase. Alteplase Medical Decision: 03/10/2020 15:44:00 Reason for delay in Medical Decision Making for thrombolytics: Laboratory coagulation studies were needed before thrombolytics decision. Alteplase/Activase CPOE Order Time: 03/10/2020 15:36:54 Needle Time: 03/10/2020 15:47:24 Weight Noted by Staff: 82.9 kg Reason for Alteplase/Activase Delay: Delayed hospital activation of TS Service  CT head was reviewed and results were: There is loss of gray-white differentiation in the posterior left insula, posterior left temporal lobe, and left parietal lobe consistent with an acute infarct. No intracranial hemorrhage, mass, midline shift, or extra-axial fluid collection is identified. A small chronic left occipital infarct in the PCA territory is new from the prior CTThere is loss of gray-white differentiation in the posterior left insula, posterior left temporal lobe, and left parietal lobe consistent with an acute infarct. No intracranial hemorrhage, mass, midline shift, or extra-axial fluid collection is identified. A small chronic left occipital infarct in the PCA territory is new from the prior CT. Hyerdense distal left MCA.MI segment Prior [facility] was in 2020  Radiologist was called back for review of advanced imaging on 03/10/2020 16:23:00 ED Physician notified of diagnostic impression and management plan on 03/10/2020 16:45:00  Advanced Imaging: CTA Head and Neck Completed.  LVO:Yes   Discussed with NIR:Yes   Discussed with NIR Time:03/10/2020 16:27:26   Discussed with NIR Text:  N/A d/w transfer center   Alteplase/Activase Contraindications:  Last Known Well > 4.5 hours: No CT Head showing hemorrhage: No Ischemic stroke within 3  months: No Severe head trauma within 3 months: No Intracranial/intraspinal surgery within 3 months: No History of intracranial hemorrhage: No  Symptoms and signs consistent with an SAH: No GI malignancy or GI bleed within 21 days: No Coagulopathy: Platelets <100 000/mm3, INR >1.7, aPTT>40 s, or PT >15 s: No Treatment dose of LMWH within the previous 24 hrs: No Use of NOACs in past 48 hours: No Glycoprotein IIb/IIIa receptor inhibitors use: No Symptoms consistent with infective endocarditis: No Suspected aortic arch dissection: No Intra-axial intracranial neoplasm: No  Verbal Consent to Alteplase/Activase: I have explained to the Family the nature of the patients condition, reviewed the indications and contraindications to the use of Alteplase/Activase fibrinolytic agent, reviewed the indications and contraindications and the benefits to be reasonably expected compared with alternative approaches. I have discussed the likelihood of major risks or complications of this procedure including (if applicable) but not limited to loss of limb function, brain damage, paralysis, hemorrhage, infection, complications from transfusion of blood components, drug reactions, blood clots and loss of life. I have also indicated that with any procedure there is always the possibility of an unexpected complication. All questions were answered and Family express understanding of the treatment plan and consent to the treatment.  Our recommendations are outlined below.  Recommendations: IV Alteplase/Activase recommended.  Alteplase/Activase bolus given Without Complication.   IV Alteplase/Activase Total Dose - 74.6 mg IV Alteplase/Activase Bolus Dose - 7.5 mg IV Alteplase/Activase Infusion Dose - 67.1 mg  Routine post Alteplase/Activase monitoring including neuro checks and blood pressure control during/after treatment Monitor blood pressure Check blood pressure and neuro assessment every 15 min for 2 h, then  every 30 min for 6 h, and finally every hour for 16 h.  Manage Blood Pressure per post Alteplase/Activase protocol.        Admission to ICU       CT brain 24 hours post Alteplase/Activase       NPO until swallowing screen performed and passed       No antiplatelet agents or anticoagulants (including heparin for DVT prophylaxis) in first 24 hours       No Foley catheter, nasogastric tube, arterial catheter or central venous catheter for 24 hr, unless absolutely necessary       Telemetry       Bedside swallow evaluation       HOB less than 30 degrees       Euglycemia       Avoid hyperthermia, PRN acetaminophen       DVT prophylaxis       Inpatient Neurology Consultation       Stroke evaluation as per inpatient neurology recommendations  Discussed with ED physician    ------------------------------------------------------------------------------  History of Present Illness: Patient is a 74 year old Female.  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 LNK 13:00, per her daughter Venida Jarvis at bedside. Patient was with her family all afternoon. She was speaking, conversing and moving normally. At baseline she ambulates with a walker and is able to manage most of her ADLs, She does have mild limitations with bathing herself due to right arm limited ROM. She became acutely aphasia, with inability to raise her right arm this afternoon.  She was mumbling and unable to converse normally. No known h/o prior strokes (although seen on CTH)  No OACs No h/o  ICH No GU/GI cancer reported No major traumas or surgeries in the past 3 months.  No strokes in the past 3 months.  No h/o abnormal bleeding, blood in the urine, stool, sputum or eyes.  + h/o cirrhosis No h/o intracranial mets Her daughter denies any h/o Right sided deficits and speech changes in the past 3 months. Exception of right shoulder limited ROM, which is chronic and unchanges. .   Last seen normal was within 4.5 hours. There is no history of hemorrhagic complications or intracranial hemorrhage. There is no history of Recent Anticoagulants. There is no history of recent major surgery. There is no history of recent stroke.  Past Medical History:      Hypertension      Hyperlipidemia      There is NO history of Stroke subjective, there is e/o chronic strokes on CTH  Anticoagulant use:  No  Antiplatelet use: ASA   Examination: BP(133/68), Pulse(80), Blood Glucose(108 PT, Plts, PTT, INR reviewed ) 1A: Level of Consciousness - Alert; keenly responsive + 0 1B: Ask Month and Age - Could Not Answer Either Question Correctly + 2 1C: Blink Eyes & Squeeze Hands - Performs 1 Task + 1 2: Test Horizontal Extraocular Movements - Normal + 0 3: Test Visual Fields - No Visual Loss + 0 4: Test Facial Palsy (Use Grimace if Obtunded) - Minor paralysis (flat nasolabial fold, smile asymmetry) + 1 5A: Test Left Arm Motor Drift - No Drift for 10 Seconds + 0 5B: Test Right Arm Motor Drift - Drift, but doesn't hit bed + 1 6A: Test Left Leg Motor Drift - No Drift for 5 Seconds + 0 6B: Test Right Leg Motor Drift - Drift, but doesn't hit bed + 1 7: Test Limb Ataxia (FNF/Heel-Shin) - Ataxia in 1 Limb + 1 8: Test Sensation - Normal; No sensory loss + 0 9: Test Language/Aphasia - Severe Aphasia: Fragmentary Expression, Inference Needed, Cannot Identify Materials + 2 10: Test Dysarthria - Normal + 0 11: Test Extinction/Inattention - Visual/tactile/auditory/spatial/personal inattention + 1  NIHSS  Score: 10  Pre-Morbid Modified Ranking Scale: 3 Points = Moderate disability; requiring some help, but able to walk without assistance   Patient/Family was informed the Neurology Consult would occur via TeleHealth consult by way of interactive audio and video telecommunications and consented to receiving care in this manner.   Patient is being evaluated for possible acute neurologic impairment and high probability of imminent or life-threatening deterioration. I spent total of 80 minutes providing care to this patient, including time for face to face visit via telemedicine, review of medical records, imaging studies and discussion of findings with providers, the patient and/or family.   Dr Ruffin Frederick   [facility] [phone]  Case [id number]

## 2020-03-10 NOTE — ED Notes (Signed)
Vladimir Crofts NP is aware of pt NIH status decline

## 2020-03-10 NOTE — ED Notes (Signed)
TPA changed from [facility] to hand per neurologist

## 2020-03-10 NOTE — ED Notes (Signed)
This RN called ER secretary to call code stroke at 14:45. Pt had EDP airway clearance for CT at 14:48. Arrived in CT at 14:50.

## 2020-03-10 NOTE — Anesthesia Procedure Notes (Signed)
Arterial Line Insertion Start/End6/20/2021 6:00 PM, 03/10/2020 6:05 PM Performed by: Suzy Bouchard, CRNA, CRNA  Patient location: OOR procedure area. Preanesthetic checklist: patient identified, IV checked and monitors and equipment checked Emergency situation radial was placed Catheter size: 20 G Hand hygiene performed  and maximum sterile barriers used   Attempts: 2 Procedure performed without using ultrasound guided technique. Following insertion, dressing applied and Biopatch. Post procedure assessment: normal  Patient tolerated the procedure well with no immediate complications. Additional procedure comments: Attempt 1 K Scientist, research (physical sciences).  Attempt 2 A Museum/gallery curator.

## 2020-03-10 NOTE — Procedures (Signed)
INTERVENTIONAL NEURORADIOLOGY BRIEF POSTPROCEDURE NOTE  DIAGNOSTIC CEREBRAL ANGIOGRAM AND MECHANICAL THROMBECTOMY  Attending: Dr. Pedro Earls  Assistant: None  Diagnosis: Left M2/MCA occlusion.  Access site: RCFA  Access closure: Perclose Proglide  Anesthesia: General  Medication used: refer to anesthesia note for sedation medications.  Complications: None  Estimated blood loss: 30 mL  Specimen: None  Findings: Proximal occlusion of a dominant left M2/MCA branch. Mechanical thrombectomy performed with direct contact aspiration, one pass with complete recanalization. Slow flow noted in a distal cortical posterior parietal branches (TICI2C). No embolus to new territory. No hemorrhagic complication on post procedure flar panel CT.  The patient tolerated the procedure well without incident or complication and is in stable condition.   Plan:  - Bed rest post tPA + femoral access 6h - SBP 120-140 - STAT head CT for any acute neurological deterioration - ICU level of care

## 2020-03-10 NOTE — Anesthesia Procedure Notes (Signed)
Procedure Name: Intubation Date/Time: 03/10/2020 5:54 PM Performed by: Suzy Bouchard, CRNA Pre-anesthesia Checklist: Patient identified, Emergency Drugs available, Suction available, Patient being monitored and Timeout performed Patient Re-evaluated:Patient Re-evaluated prior to induction Oxygen Delivery Method: Ambu bag Preoxygenation: Pre-oxygenation with 100% oxygen Induction Type: IV induction, Rapid sequence and Cricoid Pressure applied Laryngoscope Size: Glidescope and 3 Grade View: Grade I Tube size: 7.5 mm Number of attempts: 1 Airway Equipment and Method: Stylet and Video-laryngoscopy Placement Confirmation: ETT inserted through vocal cords under direct vision,  breath sounds checked- equal and bilateral and positive ETCO2 Secured at: 23 cm Tube secured with: Tape Dental Injury: Teeth and Oropharynx as per pre-operative assessment  Comments: Patient's upper and lower dentures removed and given to IR RN Estill Bamberg

## 2020-03-10 NOTE — Anesthesia Preprocedure Evaluation (Signed)
Anesthesia Evaluation  Patient identified by MRN, date of birth, ID band Patient confused    Reviewed: Allergy & Precautions, H&P , NPO status , Patient's Chart, lab work & pertinent test resultsPreop documentation limited or incomplete due to emergent nature of procedure.  Airway Mallampati: II   Neck ROM: full    Dental   Pulmonary shortness of breath, COPD, former smoker,    breath sounds clear to auscultation       Cardiovascular hypertension, +CHF  + dysrhythmias Atrial Fibrillation + pacemaker  Rhythm:regular Rate:Normal  TTE (2017): EF 45-50%. Mild-mod MR, mild-mod TR.   Neuro/Psych  Headaches, PSYCHIATRIC DISORDERS Anxiety Depression R sided weakness. Aphasia CVA    GI/Hepatic GERD  ,  Endo/Other  Hypothyroidism   Renal/GU      Musculoskeletal   Abdominal   Peds  Hematology  (+) Blood dyscrasia, anemia ,   Anesthesia Other Findings   Reproductive/Obstetrics                             Anesthesia Physical Anesthesia Plan  ASA: III and emergent  Anesthesia Plan: General   Post-op Pain Management:    Induction: Intravenous  PONV Risk Score and Plan: 3 and Ondansetron, Dexamethasone and Treatment may vary due to age or medical condition  Airway Management Planned: Oral ETT  Additional Equipment: Arterial line  Intra-op Plan:   Post-operative Plan: Extubation in OR  Informed Consent: I have reviewed the patients History and Physical, chart, labs and discussed the procedure including the risks, benefits and alternatives for the proposed anesthesia with the patient or authorized representative who has indicated his/her understanding and acceptance.       Plan Discussed with: CRNA, Anesthesiologist and Surgeon  Anesthesia Plan Comments:         Anesthesia Quick Evaluation

## 2020-03-10 NOTE — Progress Notes (Signed)
Ch visited with Pt and Pt's daughter at ED in response to Code Stroke. Ch stayed and offered support to daughter Venida Jarvis while examination was going on. Sherri let Ch know that she would be okay and would not need further assistance at this time.

## 2020-03-10 NOTE — ED Notes (Signed)
Code Stroke called to [phone]

## 2020-03-11 ENCOUNTER — Encounter (HOSPITAL_COMMUNITY): Payer: Self-pay | Admitting: Radiology

## 2020-03-11 ENCOUNTER — Inpatient Hospital Stay (HOSPITAL_COMMUNITY): Payer: Medicare Other

## 2020-03-11 DIAGNOSIS — I6389 Other cerebral infarction: Secondary | ICD-10-CM

## 2020-03-11 LAB — LIPID PANEL
Cholesterol: 202 mg/dL — ABNORMAL HIGH (ref 0–200)
HDL: 54 mg/dL (ref >40)
LDL Cholesterol: 129 mg/dL — ABNORMAL HIGH (ref 0–99)
Total CHOL/HDL Ratio: 3.7 RATIO
Triglycerides: 97 mg/dL (ref <150)
VLDL: 19 mg/dL (ref 0–40)

## 2020-03-11 LAB — ECHOCARDIOGRAM COMPLETE
Height: 62 in
Weight: 2924.18 oz

## 2020-03-11 LAB — HEMOGLOBIN A1C
Hgb A1c MFr Bld: 5.7 % — ABNORMAL HIGH (ref 4.8–5.6)
Mean Plasma Glucose: 117 mg/dL

## 2020-03-11 LAB — GLUCOSE, CAPILLARY: Glucose-Capillary: 97 mg/dL (ref 70–99)

## 2020-03-11 MED ORDER — PRAVASTATIN SODIUM 10 MG PO TABS
20.0000 mg | ORAL_TABLET | Freq: Every day | ORAL | Status: DC
  Administered 2020-03-11 – 2020-03-14 (×4): 20 mg via ORAL
  Filled 2020-03-11 (×4): qty 2

## 2020-03-11 MED ORDER — DILTIAZEM HCL ER COATED BEADS 180 MG PO CP24
180.0000 mg | ORAL_CAPSULE | Freq: Every day | ORAL | Status: DC
  Administered 2020-03-11 – 2020-03-15 (×5): 180 mg via ORAL
  Filled 2020-03-11 (×5): qty 1

## 2020-03-11 MED ORDER — VENLAFAXINE HCL ER 37.5 MG PO CP24
37.5000 mg | ORAL_CAPSULE | Freq: Every day | ORAL | Status: DC
  Administered 2020-03-11 – 2020-03-15 (×5): 37.5 mg via ORAL
  Filled 2020-03-11 (×5): qty 1

## 2020-03-11 MED ORDER — TAMOXIFEN CITRATE 10 MG PO TABS
20.0000 mg | ORAL_TABLET | Freq: Every day | ORAL | Status: DC
  Administered 2020-03-11: 20 mg via ORAL
  Filled 2020-03-11: qty 2

## 2020-03-11 MED ORDER — LEVOTHYROXINE SODIUM 50 MCG PO TABS
50.0000 ug | ORAL_TABLET | Freq: Every day | ORAL | Status: DC
  Administered 2020-03-12 – 2020-03-15 (×4): 50 ug via ORAL
  Filled 2020-03-11 (×4): qty 1

## 2020-03-11 MED ORDER — ASPIRIN EC 81 MG PO TBEC
81.0000 mg | DELAYED_RELEASE_TABLET | Freq: Every day | ORAL | Status: DC
  Administered 2020-03-11 – 2020-03-15 (×5): 81 mg via ORAL
  Filled 2020-03-11 (×5): qty 1

## 2020-03-11 MED ORDER — FERROUS SULFATE 325 (65 FE) MG PO TABS
325.0000 mg | ORAL_TABLET | Freq: Every day | ORAL | Status: DC
  Administered 2020-03-12 – 2020-03-15 (×4): 325 mg via ORAL
  Filled 2020-03-11 (×5): qty 1

## 2020-03-11 MED ORDER — ENOXAPARIN SODIUM 40 MG/0.4ML ~~LOC~~ SOLN
40.0000 mg | SUBCUTANEOUS | Status: DC
  Administered 2020-03-11: 40 mg via SUBCUTANEOUS
  Filled 2020-03-11: qty 0.4

## 2020-03-11 MED ORDER — EZETIMIBE 10 MG PO TABS
10.0000 mg | ORAL_TABLET | Freq: Every day | ORAL | Status: DC
  Administered 2020-03-11 – 2020-03-15 (×5): 10 mg via ORAL
  Filled 2020-03-11 (×5): qty 1

## 2020-03-11 MED ORDER — PREDNISONE 5 MG PO TABS
5.0000 mg | ORAL_TABLET | Freq: Every day | ORAL | Status: DC
  Administered 2020-03-11 – 2020-03-15 (×5): 5 mg via ORAL
  Filled 2020-03-11 (×5): qty 1

## 2020-03-11 MED ORDER — ENOXAPARIN SODIUM 40 MG/0.4ML ~~LOC~~ SOLN: 40.0000 mg | SUBCUTANEOUS | Status: DC

## 2020-03-11 MED ORDER — URSODIOL 300 MG PO CAPS
300.0000 mg | ORAL_CAPSULE | Freq: Two times a day (BID) | ORAL | Status: DC
  Administered 2020-03-11 – 2020-03-15 (×9): 300 mg via ORAL
  Filled 2020-03-11 (×10): qty 1

## 2020-03-11 MED ORDER — MOMETASONE FURO-FORMOTEROL FUM 100-5 MCG/ACT IN AERO
2.0000 | INHALATION_SPRAY | Freq: Two times a day (BID) | RESPIRATORY_TRACT | Status: DC
  Administered 2020-03-11 – 2020-03-15 (×7): 2 via RESPIRATORY_TRACT
  Filled 2020-03-11 (×2): qty 8.8

## 2020-03-11 MED ORDER — LISINOPRIL 5 MG PO TABS
5.0000 mg | ORAL_TABLET | Freq: Every day | ORAL | Status: DC
  Administered 2020-03-11 – 2020-03-15 (×5): 5 mg via ORAL
  Filled 2020-03-11 (×5): qty 1

## 2020-03-11 NOTE — Progress Notes (Signed)
PT Cancellation Note  Patient Details Name: MILEIGH TILLEY MRN: [id number] DOB: 09-08-1946   Cancelled Treatment:    Reason Eval/Treat Not Completed: Active bedrest order. Pt with order for strict bedrest. TPA received 03/10/20 1547.    Lorriane Shire 03/11/2020, 7:51 AM  Lorrin Goodell, PT  Office # [phone] Pager [phone]

## 2020-03-11 NOTE — Progress Notes (Signed)
Inpatient Rehab Admissions Coordinator Note:   Per PT/OT recommendations, pt was screened for CIR candidacy by Gayland Curry, MS, CCC-SLP.  At this time we are recommending an Inpatient Rehab consult.  AC will place consult order per protocol.  Please contact me with questions.    Gayland Curry, [place], [place] Admissions Coordinator [phone] 03/11/20 4:23 PM

## 2020-03-11 NOTE — Evaluation (Signed)
Occupational Therapy Evaluation Patient Details Name: Adriana Martin MRN: [id number] DOB: Apr 03, 1946 Today's Date: 03/11/2020    History of Present Illness This 74 y.o. female transferred from [facility]  with Rt UE weakness, acute onset aphasia. TpA administered,  CTA of head showed M2 occlusion.  She underwent mechanical thrombectomy.  PMH includes: thrombocytopenia, SOB, permanent cardiac pacemaker, h/o radiation therapy due to breast CA, COPD, A-Fib, CHF, s/p Rt reverse shoulder arthroplasty, s/p MVR, s/p Rt mastectomy with axillary lymph node dissection   Clinical Impression   Pt admitted with above. She demonstrates the below listed deficits and will benefit from continued OT to maximize safety and independence with BADLs.  Pt presents to OT with communication deficits, Rt homonymous hemianopia, Impaired balance, Rt visual inattention, impaired cognition.  She currently requires min A and multi modal cues to complete ADLs and min A for functional mobility.  Her daughter has been living with her since December, while pt was undergoing breast CA treatment, but works during the day.  Pt was mod I for ADLs and ambulated with SPC.  Recommend CIR level rehab to maximize safety and independence with ADLs to allow her to return home at supervision level.       Follow Up Recommendations  CIR;Supervision/Assistance - 24 hour    Equipment Recommendations  None recommended by OT    Recommendations for Other Services Rehab consult     Precautions / Restrictions Precautions Precautions: Fall      Mobility Bed Mobility Overal bed mobility: Needs Assistance Bed Mobility: Supine to Sit     Supine to sit: Min guard        Transfers Overall transfer level: Needs assistance Equipment used: 1 person hand held assist Transfers: Sit to/from [facility] Sit to Stand: Min assist Stand pivot transfers: Min assist       General transfer comment: assist for balance      Balance Overall balance assessment: Needs assistance Sitting-balance support: Feet supported Sitting balance-Leahy Scale: Fair     Standing balance support: No upper extremity supported Standing balance-Leahy Scale: Fair Standing balance comment: able to maintain static standing with min guard assist                            ADL either performed or assessed with clinical judgement   ADL Overall ADL's : Needs assistance/impaired Eating/Feeding: Set up;Sitting   Grooming: Wash/dry hands;Wash/dry face;Brushing hair;Minimal assistance;Sitting   Upper Body Bathing: Minimal assistance;Sitting   Lower Body Bathing: Minimal assistance;Sit to/from stand   Upper Body Dressing : Minimal assistance;Sitting   Lower Body Dressing: Minimal assistance;Sit to/from stand   Toilet Transfer: Minimal assistance;Ambulation;Comfort height toilet   Toileting- Clothing Manipulation and Hygiene: Minimal assistance;Sit to/from stand       Functional mobility during ADLs: Minimal assistance       Vision Baseline Vision/History: Wears glasses Wears Glasses: At all times Vision Assessment?: Yes Eye Alignment: Within Functional Limits Ocular Range of Motion: Within Functional Limits Alignment/Gaze Preference: Within Defined Limits Tracking/Visual Pursuits: Able to track stimulus in all quads without difficulty Visual Fields: Right homonymous hemianopsia     Perception Perception Perception Tested?: Yes Perception Deficits: Inattention/neglect Inattention/Neglect: Does not attend to right visual field   Praxis Praxis Praxis-Other Comments: to be further assessed - difficult to accurately assess due to impaired communication     Pertinent Vitals/Pain Pain Assessment: Faces Faces Pain Scale: No hurt     Hand Dominance Right  Extremity/Trunk Assessment Upper Extremity Assessment Upper Extremity Assessment: RUE deficits/detail RUE Deficits / Details: Pt with h/o Rt shoulder  arthroplasty with premorbid limitations with function of Rt UE.     Lower Extremity Assessment Lower Extremity Assessment: Defer to PT evaluation   Cervical / Trunk Assessment Cervical / Trunk Assessment: Normal   Communication Communication Communication: Expressive difficulties;Receptive difficulties   Cognition Arousal/Alertness: Awake/alert Behavior During Therapy: Flat affect Overall Cognitive Status: Difficult to assess Area of Impairment: Attention;Problem solving                   Current Attention Level: Sustained         Problem Solving: Slow processing General Comments: Pt demonstrates flat affect.  She follows one setp commands consistently with multimodal cues, but unable to follow 2 step commands   General Comments  VSS.   Daughter present     Exercises     Shoulder Instructions      Home Living Family/patient expects to be discharged to:: Private residence Living Arrangements: Children Available Help at Discharge: Family;Available 24 hours/day Type of Home: Mobile home Home Access: Ramped entrance     Home Layout: One level     Bathroom Shower/Tub: Occupational psychologist: Standard Bathroom Accessibility: Yes   Home Equipment: Environmental consultant - 4 wheels;Cane - single point;Shower seat;Other (comment) (life alert )   Additional Comments: Pt's daughter has been living with her since December while pt undergoes radiation therapy       Prior Functioning/Environment Level of Independence: Independent with assistive device(s)        Comments: Pt was able to perform ADLs mod I.  Uses SPC.  Does not drive.  Has a life alert.   no falls in the last 6 mos         OT Problem List: Decreased strength;Decreased activity tolerance;Impaired balance (sitting and/or standing);Impaired vision/perception;Decreased cognition;Decreased safety awareness      OT Treatment/Interventions: Self-care/ADL training;Energy conservation;Therapeutic  activities;Cognitive remediation/compensation;Visual/perceptual remediation/compensation;Patient/family education;Balance training;DME and/or AE instruction    OT Goals(Current goals can be found in the care plan section) Acute Rehab OT Goals Patient Stated Goal: Pt didn't state  OT Goal Formulation: With patient/family Time For Goal Achievement: 03/25/20 Potential to Achieve Goals: Good ADL Goals Pt Will Perform Grooming: with min guard assist;standing Pt Will Perform Upper Body Bathing: with supervision;sitting Pt Will Perform Lower Body Bathing: with min guard assist;sit to/from stand Pt Will Perform Upper Body Dressing: with supervision;sitting Pt Will Perform Lower Body Dressing: with min guard assist;sit to/from stand Pt Will Transfer to Toilet: with min guard assist;ambulating;regular height toilet Pt Will Perform Toileting - Clothing Manipulation and hygiene: with min guard assist;sit to/from stand Additional ADL Goal #1: Pt will locate items on her Rt with min cues during ADL tasks  OT Frequency: Min 2X/week   Barriers to D/C: Decreased caregiver support  daughter works        Co-evaluation              AM-PAC OT "6 Clicks" Daily Activity     Outcome Measure Help from another person eating meals?: A Little Help from another person taking care of personal grooming?: A Little Help from another person toileting, which includes using toliet, bedpan, or urinal?: A Little Help from another person bathing (including washing, rinsing, drying)?: A Little Help from another person to put on and taking off regular upper body clothing?: A Little Help from another person to put on and taking off regular  lower body clothing?: A Little 6 Click Score: 18   End of Session Equipment Utilized During Treatment: Gait belt Nurse Communication: Mobility status  Activity Tolerance: Patient tolerated treatment well Patient left: in chair;with call bell/phone within reach;with chair alarm  set  OT Visit Diagnosis: Unsteadiness on feet (R26.81);Cognitive communication deficit (R41.841) Symptoms and signs involving cognitive functions: Cerebral infarction                Time: 2409-7353 OT Time Calculation (min): 28 min Charges:  OT General Charges $OT Visit: 1 Visit OT Evaluation $OT Eval Moderate Complexity: 1 Mod OT Treatments $Self Care/Home Management : 8-22 mins  Nilsa Nutting., OTR/L Acute Rehabilitation Services Pager [phone] Office [phone]   Lucille Passy M 03/11/2020, 1:33 PM

## 2020-03-11 NOTE — Progress Notes (Signed)
  Speech Language Pathology Treatment: Cognitive-Linquistic (Aphasia )  Patient Details Name: Adriana Martin MRN: [id number] DOB: [birth year] Today's Date: 03/11/2020 Time: 1031-5945 SLP Time Calculation (min) (ACUTE ONLY): 16 min  Assessment / Plan / Recommendation Clinical Impression  Pt was seen for aphasia treatment. She was educated regarding the nature of aphasia and her specific areas of difficulty. She verbalized understanding but the extent of her comprehension is questioned. Pt demonstrated 100% accuracy with phrase completion and 80% accuracy with sentence completion when additional processing time was provided. She engaged in simple conversation but frequently became frustrated by paraphasic errors. She achieved 80% accuracy with confrontational naming when orthographic and semantic cues were given. She demonstrated reading comprehension at the word level with 75% accuracy. SLP will continue to follow pt.    HPI HPI: Pt is a 74 y.o. female transferred from [facility]  with R UE weakness, and acute onset aphasia. TPA administered, CTA of head showed M2 occlusion.  CT head: Acute left MCA infarct with hyperdense left MCA. She underwent mechanical thrombectomy. PMH includes: thrombocytopenia, SOB, permanent cardiac pacemaker, h/o radiation therapy due to breast CA, COPD, A-Fib, CHF, s/p Rt reverse shoulder arthroplasty, s/p MVR, s/p Rt mastectomy with axillary lymph node dissection.       SLP Plan     Patient needs continued Speech Lanaguage Pathology Services    Recommendations                   Follow up Recommendations: Inpatient Rehab;24 hour supervision/assistance SLP Visit Diagnosis: Aphasia (R47.01)       [place] Coach I. Hardin Negus, [place], [place] Office number [phone] Pager [place] 03/11/2020, 5:53 PM

## 2020-03-11 NOTE — Progress Notes (Signed)
  Echocardiogram 2D Echocardiogram has been performed.  Jennette Dubin 03/11/2020, 2:48 PM

## 2020-03-11 NOTE — Evaluation (Signed)
Physical Therapy Evaluation Patient Details Name: Adriana Martin MRN: [id number] DOB: [birth year] Today's Date: 03/11/2020   History of Present Illness  This 74 y.o. female transferred from [facility]  with Rt UE weakness, acute onset aphasia. TpA administered,  CTA of head showed M2 occlusion.  She underwent mechanical thrombectomy.  PMH includes: thrombocytopenia, SOB, permanent cardiac pacemaker, h/o radiation therapy due to breast CA, COPD, A-Fib, CHF, s/p Rt reverse shoulder arthroplasty, s/p MVR, s/p Rt mastectomy with axillary lymph node dissection    Clinical Impression  Pt admitted with above diagnosis. PTA pt modified independent mobility/ADLs using SPC. On eval, she required min assist transfers and min/HHA ambulation 50'. Pt presents with deficits in balance and safety awareness. She also demonstrates expressive and receptive aphasia. Pt currently with functional limitations due to the deficits listed below (see PT Problem List). Pt will benefit from skilled PT to increase their independence and safety with mobility to allow discharge to the venue listed below.       Follow Up Recommendations CIR    Equipment Recommendations  None recommended by PT    Recommendations for Other Services Rehab consult     Precautions / Restrictions Precautions Precautions: Fall      Mobility  Bed Mobility Overal bed mobility: Needs Assistance Bed Mobility: Supine to Sit     Supine to sit: Min guard     General bed mobility comments: Pt received in recliner.  Transfers Overall transfer level: Needs assistance Equipment used: 1 person hand held assist Transfers: Sit to/from Stand Sit to Stand: Min assist Stand pivot transfers: Min assist       General transfer comment: assist for balance   Ambulation/Gait Ambulation/Gait assistance: Min assist Gait Distance (Feet): 50 Feet Assistive device: 1 person hand held assist Gait Pattern/deviations: Step-through pattern;Decreased stride  length;Shuffle Gait velocity: decreased Gait velocity interpretation: <1.31 ft/sec, indicative of household ambulator General Gait Details: short, shuffle steps, mildly unsteady  Stairs            Wheelchair Mobility    Modified Rankin (Stroke Patients Only) Modified Rankin (Stroke Patients Only) Pre-Morbid Rankin Score: No symptoms Modified Rankin: Moderately severe disability     Balance Overall balance assessment: Needs assistance Sitting-balance support: Feet supported;Bilateral upper extremity supported Sitting balance-Leahy Scale: Fair     Standing balance support: Single extremity supported;During functional activity Standing balance-Leahy Scale: Fair Standing balance comment: able to maintain static standing with min guard assist                              Pertinent Vitals/Pain Pain Assessment: No/denies pain Faces Pain Scale: No hurt    Home Living Family/patient expects to be discharged to:: Private residence Living Arrangements: Children Available Help at Discharge: Family;Available 24 hours/day Type of Home: Mobile home Home Access: Ramped entrance     Home Layout: One level Home Equipment: [place] - 4 wheels;Cane - single point;Shower seat;Other (comment) Additional Comments: Pt's daughter has been living with her since December while pt undergoes radiation therapy     Prior Function Level of Independence: Independent with assistive device(s)         Comments: Pt was able to perform ADLs mod I.  Uses SPC.  Does not drive.  Has a life alert.   no falls in the last 6 mos      Hand Dominance   Dominant Hand: Right    Extremity/Trunk Assessment   Upper Extremity Assessment Upper  Extremity Assessment: Defer to OT evaluation RUE Deficits / Details: Pt with h/o Rt shoulder arthroplasty with premorbid limitations with function of Rt UE.      Lower Extremity Assessment Lower Extremity Assessment: Overall WFL for tasks assessed  (symmetrical)    Cervical / Trunk Assessment Cervical / Trunk Assessment: Normal  Communication   Communication: Expressive difficulties;Receptive difficulties  Cognition Arousal/Alertness: Awake/alert Behavior During Therapy: Flat affect Overall Cognitive Status: Difficult to assess Area of Impairment: Attention;Problem solving                   Current Attention Level: Sustained         Problem Solving: Slow processing General Comments: Pt demonstrates flat affect.  She follows one setp commands consistently with multimodal cues, but unable to follow 2 step commands      General Comments General comments (skin integrity, edema, etc.): VSS. Daughter present.    Exercises     Assessment/Plan    PT Assessment Patient needs continued PT services  PT Problem List Decreased mobility;Decreased safety awareness;Decreased knowledge of precautions;Decreased activity tolerance;Decreased cognition;Decreased balance       PT Treatment Interventions Therapeutic activities;DME instruction;Cognitive remediation;Gait training;Therapeutic exercise;Patient/family education;Balance training;Functional mobility training;Neuromuscular re-education    PT Goals (Current goals can be found in the Care Plan section)  Acute Rehab PT Goals Patient Stated Goal: not stated PT Goal Formulation: With patient/family Time For Goal Achievement: 03/25/20 Potential to Achieve Goals: Good    Frequency Min 4X/week   Barriers to discharge        Co-evaluation               AM-PAC PT "6 Clicks" Mobility  Outcome Measure Help needed turning from your back to your side while in a flat bed without using bedrails?: A Little Help needed moving from lying on your back to sitting on the side of a flat bed without using bedrails?: A Little Help needed moving to and from a bed to a chair (including a wheelchair)?: A Little Help needed standing up from a chair using your arms (e.g., wheelchair  or bedside chair)?: A Little Help needed to walk in hospital room?: A Little Help needed climbing 3-5 steps with a railing? : A Lot 6 Click Score: 17    End of Session Equipment Utilized During Treatment: Gait belt Activity Tolerance: Patient tolerated treatment well Patient left: in chair;with call bell/phone within reach;with family/visitor present Nurse Communication: Mobility status PT Visit Diagnosis: Unsteadiness on feet (R26.81)    Time: 7517-0017 PT Time Calculation (min) (ACUTE ONLY): 18 min   Charges:   PT Evaluation $PT Eval Moderate Complexity: 1 Mod          Lorrin Goodell, PT  Office # [phone] Pager [phone]   Lorriane Shire 03/11/2020, 2:11 PM

## 2020-03-11 NOTE — Progress Notes (Signed)
STROKE TEAM PROGRESS NOTE   INTERVAL HISTORY Patient presented with left M2 occlusion and was treated with IV TPA followed by successful mechanical thrombectomy.  She was extubated.  Blood pressure adequately controlled.  She is awake alert interactive and following commands.  MRI scan and echocardiogram are pending  Vitals:   03/11/20 0506 03/11/20 0600 03/11/20 0700 03/11/20 0800  BP:  118/69 125/68 106/64  Pulse: 79 80 80 80  Resp: 12 14 13 14   Temp:    (!) 97.4 F (36.3 C)  TempSrc:    Axillary  SpO2: 100% 100% 100% 100%  Height:        CBC:  Recent Labs  Lab 03/10/20 1509  WBC 10.2  NEUTROABS 9.0*  HGB 13.9  HCT 41.8  MCV 89.7  PLT 532    Basic Metabolic Panel:  Recent Labs  Lab 03/10/20 1509  NA 137  K 4.4  CL 98  CO2 26  GLUCOSE 116*  BUN 23  CREATININE 1.17*  CALCIUM 9.7   Lipid Panel:     Component Value Date/Time   CHOL 202 (H) 03/11/2020 0552   CHOL 276 (H) 08/20/2013 0335   TRIG 97 03/11/2020 0552   TRIG 121 08/20/2013 0335   HDL 54 03/11/2020 0552   HDL 81 (H) 08/20/2013 0335   CHOLHDL 3.7 03/11/2020 0552   VLDL 19 03/11/2020 0552   VLDL 24 08/20/2013 0335   LDLCALC 129 (H) 03/11/2020 0552   LDLCALC 171 (H) 08/20/2013 0335   HgbA1c:  Lab Results  Component Value Date   HGBA1C 6.1 (H) 03/22/2016   Urine Drug Screen: No results found for: LABOPIA, COCAINSCRNUR, LABBENZ, AMPHETMU, THCU, LABBARB  Alcohol Level     Component Value Date/Time   ETH <10 08/04/2017 1716    IMAGING past 24 hours CT HEAD CODE STROKE WO CONTRAST`  Result Date: 03/10/2020 CLINICAL DATA:  Code stroke.  Expressive aphasia. EXAM: CT HEAD WITHOUT CONTRAST TECHNIQUE: Contiguous axial images were obtained from the base of the skull through the vertex without intravenous contrast. COMPARISON:  11/03/2018 FINDINGS: Brain: There is loss of gray-white differentiation in the posterior left insula, posterior left temporal lobe, and left parietal lobe consistent with an  acute infarct. No intracranial hemorrhage, mass, midline shift, or extra-axial fluid collection is identified. A small chronic left occipital infarct in the PCA territory is new from the prior CT. A small chronic right cerebellar infarct is unchanged. Hypodensities in the cerebral white matter bilaterally are similar to the prior CT and nonspecific but compatible with mild chronic small vessel ischemic disease. Mild cerebral atrophy is within normal limits for age. Vascular: Calcified atherosclerosis at the skull base. Hyperdense left MCA in the distal M1/bifurcation region. Skull: The visualized paranasal sinus that no fracture or suspicious osseous lesion. Sinuses/Orbits: Visualized paranasal sinuses and mastoid air cells are clear. Bilateral cataract extraction. Other: None. ASPECTS [facility] Stroke Program Early CT Score) - Ganglionic level infarction (caudate, lentiform nuclei, internal capsule, insula, M1-M3 cortex): 5 - Supraganglionic infarction (M4-M6 cortex): 2 Total score (0-10 with 10 being normal): 7 IMPRESSION: 1. Acute left MCA infarct with hyperdense left MCA. No intracranial hemorrhage. 2. ASPECTS is 7. These results were called by telephone at the time of interpretation on 03/10/2020 at 3:19 pm to [facility], who verbally acknowledged these results. Electronically Signed   By: Logan Bores M.D.   On: 03/10/2020 15:21   CT ANGIO HEAD CODE STROKE  Result Date: 03/10/2020 CLINICAL DATA:  Aphasia. EXAM: CT ANGIOGRAPHY HEAD  AND NECK TECHNIQUE: Multidetector CT imaging of the head and neck was performed using the standard protocol during bolus administration of intravenous contrast. Multiplanar CT image reconstructions and MIPs were obtained to evaluate the vascular anatomy. Carotid stenosis measurements (when applicable) are obtained utilizing NASCET criteria, using the distal internal carotid diameter as the denominator. CONTRAST:  14mL OMNIPAQUE IOHEXOL 350 MG/ML SOLN COMPARISON:  None. FINDINGS:  CTA NECK FINDINGS Aortic arch: Standard 3 vessel aortic arch with mild atherosclerotic plaque. No significant arch vessel origin stenosis. Right carotid system: Patent without evidence of stenosis, dissection, or significant atherosclerosis. Retropharyngeal course of the proximal ICA. Left carotid system: Patent with minimal calcified plaque at the carotid bifurcation. No evidence of stenosis or dissection. Retropharyngeal course of the proximal ICA. Vertebral arteries: Patent without evidence of stenosis or dissection. Mildly dominant left vertebral artery. Skeleton: Asymmetrically advanced facet arthrosis on the left at C2-3 and C3-4 with associated grade 1 anterolisthesis. Moderate to severe multilevel cervical disc degeneration. Other neck: No evidence of cervical lymphadenopathy or mass. Upper chest: Respiratory motion artifact without apical lung consolidation or mass. Partially visualized left subclavian approach pacemaker. Review of the MIP images confirms the above findings CTA HEAD FINDINGS Anterior circulation: The internal carotid arteries are patent from skull base to carotid termini with minimal nonstenotic plaque bilaterally. The left M1 segment is widely patent, however there is occlusion of the dominant left M2 inferior division just beyond the bifurcation with mild reconstitution of distal branch vessels. The right MCA and both ACAs are patent without evidence of a proximal branch occlusion or significant proximal stenosis. No aneurysm is identified. Posterior circulation: The intracranial vertebral arteries are widely patent to the basilar. Patent right PICA, left AICA, and bilateral SCAs are seen. The basilar artery is widely patent. There are diminutive posterior communicating arteries. Both PCAs are patent without evidence of a significant proximal stenosis, however there is a severe distal P2 stenosis on the left. No aneurysm is identified. Venous sinuses: Patent. Anatomic variants: None.  Review of the MIP images confirms the above findings IMPRESSION: 1. Proximal left M2 inferior division occlusion. 2. Severe distal left P2 stenosis. 3. Widely patent carotid and vertebral arteries. 4.  Aortic Atherosclerosis (ICD10-I70.0). These results were called by telephone at the time of interpretation on 03/10/2020 at 4:24 pm to Dr. Ruffin Frederick, who verbally acknowledged these results. Electronically Signed   By: Logan Bores M.D.   On: 03/10/2020 16:40   CT ANGIO NECK CODE STROKE  Result Date: 03/10/2020 CLINICAL DATA:  Aphasia. EXAM: CT ANGIOGRAPHY HEAD AND NECK TECHNIQUE: Multidetector CT imaging of the head and neck was performed using the standard protocol during bolus administration of intravenous contrast. Multiplanar CT image reconstructions and MIPs were obtained to evaluate the vascular anatomy. Carotid stenosis measurements (when applicable) are obtained utilizing NASCET criteria, using the distal internal carotid diameter as the denominator. CONTRAST:  89mL OMNIPAQUE IOHEXOL 350 MG/ML SOLN COMPARISON:  None. FINDINGS: CTA NECK FINDINGS Aortic arch: Standard 3 vessel aortic arch with mild atherosclerotic plaque. No significant arch vessel origin stenosis. Right carotid system: Patent without evidence of stenosis, dissection, or significant atherosclerosis. Retropharyngeal course of the proximal ICA. Left carotid system: Patent with minimal calcified plaque at the carotid bifurcation. No evidence of stenosis or dissection. Retropharyngeal course of the proximal ICA. Vertebral arteries: Patent without evidence of stenosis or dissection. Mildly dominant left vertebral artery. Skeleton: Asymmetrically advanced facet arthrosis on the left at C2-3 and C3-4 with associated grade 1 anterolisthesis. Moderate to severe  multilevel cervical disc degeneration. Other neck: No evidence of cervical lymphadenopathy or mass. Upper chest: Respiratory motion artifact without apical lung consolidation or mass.  Partially visualized left subclavian approach pacemaker. Review of the MIP images confirms the above findings CTA HEAD FINDINGS Anterior circulation: The internal carotid arteries are patent from skull base to carotid termini with minimal nonstenotic plaque bilaterally. The left M1 segment is widely patent, however there is occlusion of the dominant left M2 inferior division just beyond the bifurcation with mild reconstitution of distal branch vessels. The right MCA and both ACAs are patent without evidence of a proximal branch occlusion or significant proximal stenosis. No aneurysm is identified. Posterior circulation: The intracranial vertebral arteries are widely patent to the basilar. Patent right PICA, left AICA, and bilateral SCAs are seen. The basilar artery is widely patent. There are diminutive posterior communicating arteries. Both PCAs are patent without evidence of a significant proximal stenosis, however there is a severe distal P2 stenosis on the left. No aneurysm is identified. Venous sinuses: Patent. Anatomic variants: None. Review of the MIP images confirms the above findings IMPRESSION: 1. Proximal left M2 inferior division occlusion. 2. Severe distal left P2 stenosis. 3. Widely patent carotid and vertebral arteries. 4.  Aortic Atherosclerosis (ICD10-I70.0). These results were called by telephone at the time of interpretation on 03/10/2020 at 4:24 pm to Dr. Ruffin Frederick, who verbally acknowledged these results. Electronically Signed   By: Logan Bores M.D.   On: 03/10/2020 16:40    PHYSICAL EXAM Pleasant elderly Caucasian lady not in distress. . Afebrile. Head is nontraumatic. Neck is supple without bruit.    Cardiac exam no murmur or gallop. Lungs are clear to auscultation. Distal pulses are well felt. Neurological Exam :  She is awake alert oriented to time place and person.  Extraocular movements are full range without nystagmus.  Right homonymous hemianopsia.  Speech is clear without  dysarthria or aphasia.  Right lower facial asymmetry.  Tongue midline.  Motor system exam shows no upper or lower extremity drift but mild weakness of right grip and intrinsic hand muscles.  Orbits left over right upper extremity.  Mild weakness of right hip flexors and ankle dorsiflexors.  Sensation appears preserved bilaterally.  Gait not tested. ASSESSMENT/PLAN Ms. KEANI GOTCHER is a 74 y.o. female with history of recurrent breast cancer undercoing XRT, primary biliary cirrhosis, MGUS, AF on aspirin (not on AC d/t fall risk), limited ROM R shoulder d/t arthropathy, HTN, HLD, CKD, COPD presenting with R sided weakness and incontinence. Received tPA 03/10/2020 at 1547 per MAR. Found to have hyperdense L MCA and sent to IR.  Stroke:   L MCA infarct s/p partial revascularization L M2. Infarct embolic secondary to known AF not on [facility]  Code Stroke CT head acute L MCA infarct w/ hyperdense L MCA. ASPECTS 7    CTA head & neck proximal L M2 inferior division occlusion. Severe distal L P2 stenosis. Aortic atherosclerosis.   Cerebral angio proximal occlusion L M2 branch. Completed revascularization w/ slow flow distally TICI2c.   Post IR CT no hemorrhage    24h CT pending   2D Echo EF 55-60%. No source of embolus. Bioprosthetic MV  LDL 129  HgbA1c pending   SCDs for VTE prophylaxis. Add lovenox if 24h imaging neg for hemorrhage.   aspirin 81 mg daily prior to admission, now on No antithrombotic. Add aspirin if 24h imaging neg for hemorrhage.  Therapy recommendations:  CIR   Disposition:  pending  Atrial Fibrillation  Home anticoagulation:  none d/t fall risk    Hypertension  Home meds:  Lisinopril 5, diltiazem CD 180, lasix 20  Resumed home meds x lasix (and K)  Stable . BP goal per IR x 24h following IR procedure  . Long-term BP goal normotensive . Ok to D/c aline at 24h  Hyperlipidemia  Home meds:  zetia 10 and pravachol 20, resumed in hospital  LDL 129, goal <  70  Continue statin at discharge  Dysphagia . Secondary to stroke . NPO . Speech on board   Other Stroke Risk Factors  Advanced age  Former Cigarette smoker, quit 21 yrs ago  Obesity, Body mass index is 33.43 kg/m., recommend weight loss, diet and exercise as appropriate   Hx Congestive heart failure  Pacer  Other Active Problems  breast cancer, currently off tamoxifen d/t chemo  CKD stage 3    COPD on inhaler   Fe deficiency anemia  Depression on effexor   On prednisone   Hospital day # 1  She presented with embolic left M2 occlusion treated with IV TPA and partial mechanical thrombectomy and seems to be doing well.  Continue strict blood pressure control as per post thrombectomy protocol.  Mobilize out of bed.  Therapy consults.  Check repeat brain imaging this afternoon and start aspirin if no bleed.  She may need anticoagulation prior to discharge given history of A. fib.  Continue ongoing stroke work-up.  Discussed with patient and nurse and answered questions.This patient is critically ill and at significant risk of neurological worsening, death and care requires constant monitoring of vital signs, hemodynamics,respiratory and cardiac monitoring, extensive review of multiple databases, frequent neurological assessment, discussion with family, other specialists and medical decision making of high complexity.I have made any additions or clarifications directly to the above note.This critical care time does not reflect procedure time, or teaching time or supervisory time of PA/NP/Med Resident etc but could involve care discussion time.  I spent 30 minutes of neurocritical care time  in the care of  this patient.    Antony Contras, MD  To contact Stroke Continuity provider, please refer to [email or web address] After hours, contact General Neurology

## 2020-03-11 NOTE — Evaluation (Signed)
Speech Language Pathology Evaluation Patient Details Name: Adriana Martin MRN: [id number] DOB: [birth year] Today's Date: 03/11/2020 Time: 8338-2505 SLP Time Calculation (min) (ACUTE ONLY): 17 min  Problem List:  Patient Active Problem List   Diagnosis Date Noted  . Acute ischemic stroke ([place]) 03/10/2020  . Osteopenia 01/21/2020  . Family history of breast cancer   . Family history of multiple myeloma   . Family history of esophageal cancer   . 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/2018  . 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 Carcinoma of overlapping sites of right breast in female, estrogen receptor positive ([place]) 06/16/2016  . Acute exacerbation of CHF (congestive heart failure) ([place]) 06/07/2016  . A-fib ([place]) 04/08/2016  . COPD exacerbation ([place]) 03/22/2016  . Acute bronchitis 03/22/2016  . Atrial fibrillation with RVR ([place]) 03/22/2016  . Chronic renal insufficiency 03/22/2016  . Cough with expectoration 06/04/2015  . Thrombocytosis ([place]) 02/01/2015  . Anemia 02/01/2015  . Atrial flutter ([place]) 01/23/2015  . Atrial fibrillation ([place]) 01/23/2015  . Other long term (current) drug therapy 11/06/2014  . Chronic diastolic heart failure ([place]) 08/01/2014  . Long term current use of anticoagulant 06/06/2014  . Anxiety 05/13/2014  . Chronic obstructive pulmonary disease ([place]) 03/22/2014  . 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 Avitaminosis D 02/19/2014   Past Medical History:  Past Medical History:  Diagnosis Date  . Anemia   . Anxiety   . Atrial fibrillation ([place])   . B12 deficiency   . Breast cancer ([place])    RT Lumpectomy c radiation 2012  . Breast cancer ([place])   . CHF (congestive heart failure) ([place])   . Chronic kidney disease   . Cirrhosis ([place])   . COPD (chronic obstructive pulmonary disease) ([place])   . Depression   . Family history of breast cancer   . Family history of esophageal cancer   . Family history of multiple myeloma    . GERD (gastroesophageal reflux disease)   . Headache   . Heart murmur   . Hyperlipemia   . Hyperlipidemia   . Hypertension   . Hypothyroidism   . Iron (Fe) deficiency anemia   . Personal history of radiation therapy   . Presence of permanent cardiac pacemaker   . Shortness of breath dyspnea   . Thrombocytopenia ([place])   . Vitamin D deficiency    Past Surgical History:  Past Surgical History:  Procedure Laterality Date  . ABDOMINAL HYSTERECTOMY     Partial  . ABLATION    . APPENDECTOMY    . BICEPT TENODESIS Right 01/07/2018   Procedure: BICEPS TENODESIS;  Surgeon: Leim Fabry, MD;  Location: [facility] ORS;  Service: Orthopedics;  Laterality: Right;  . BREAST BIOPSY Right 2012  . BREAST BIOPSY Right 07/21/2019   venus clip, [place] Bx, pending path   . BREAST LUMPECTOMY Right 2012   positive/rad  . BREAST SURGERY    . CARDIAC SURGERY    . CARDIAC VALVE REPLACEMENT    . COLONOSCOPY  2012  . ELECTROPHYSIOLOGIC STUDY N/A 01/23/2015   Procedure: CARDIOVERSION;  Surgeon: Corey Skains, MD;  Location: [facility] ORS;  Service: Cardiovascular;  Laterality: N/A;  . ELECTROPHYSIOLOGIC STUDY N/A 05/27/2016   Procedure: CARDIOVERSION;  Surgeon: Corey Skains, MD;  Location: [facility] ORS;  Service: Cardiovascular;  Laterality: N/A;  . ESOPHAGOGASTRODUODENOSCOPY  2012  . EYE SURGERY    . IR CT HEAD LTD  03/10/2020  . IR PERCUTANEOUS ART THROMBECTOMY/INFUSION INTRACRANIAL INC DIAG ANGIO  03/10/2020  .  IR US GUIDE VASC ACCESS RIGHT  03/10/2020  . MASTECTOMY WITH AXILLARY LYMPH NODE DISSECTION Right 08/09/2019   Procedure: MASTECTOMY WITH AXILLARY LYMPH NODE DISSECTION;  Surgeon: Robert Bellow, MD;  Location: [facility] ORS;  Service: General;  Laterality: Right;  . MITRAL VALVE REPLACEMENT    . mvp    . RADIOLOGY WITH ANESTHESIA N/A 03/10/2020   Procedure: IR WITH ANESTHESIA;  Surgeon: Radiologist, Medication, MD;  Location: [place];  Service: Radiology;  Laterality: N/A;  . REVERSE SHOULDER ARTHROPLASTY  Right 01/07/2018   Procedure: REVERSE SHOULDER ARTHROPLASTY;  Surgeon: Leim Fabry, MD;  Location: [facility] ORS;  Service: Orthopedics;  Laterality: Right;  Marland Kitchen VSD REPAIR     HPI:  Pt is a 74 y.o. female transferred from [facility]  with R UE weakness, and acute onset aphasia. TPA administered, CTA of head showed M2 occlusion.  CT head: Acute left MCA infarct with hyperdense left MCA. She underwent mechanical thrombectomy. PMH includes: thrombocytopenia, SOB, permanent cardiac pacemaker, h/o radiation therapy due to breast CA, COPD, A-Fib, CHF, s/p Rt reverse shoulder arthroplasty, s/p MVR, s/p Rt mastectomy with axillary lymph node dissection.    Assessment / Plan / Recommendation Clinical Impression  Pt reported that she lives with one of her daughters and she denied any baseline deficits in speech/language. She presents with moderate non-fluent aphasia characterized by impairments in receptive and expressive language with more notable difficulty with verbal expression. She independently produced some phrases during social contexts. She demonstrated difficulty with automatic sequences, naming, and repetition, and she consistently required additional processing time to formulate novel sentences. Semantic paraphasias and neologisms were noted during attempts at naming. Pt was able to answer simple yes/no questions and inconsistently follow 1-step commands, but exhibited increased difficulty with more complex auditory comprehension and with reading comprehension.  No motor speech deficits were noted during the evaluation and cognition assessment was deferred due to pt's language deficits. Skilled SLP services are clinically indicated at this time to improve aphasia.     SLP Assessment  SLP Recommendation/Assessment: Patient needs continued Speech Lanaguage Pathology Services SLP Visit Diagnosis: Aphasia (R47.01)    Follow Up Recommendations  Inpatient Rehab;24 hour supervision/assistance    Frequency and  Duration min 2x/week  2 weeks      SLP Evaluation Cognition  Overall Cognitive Status: Difficult to assess (due to aphasia)       Comprehension  Auditory Comprehension Overall Auditory Comprehension: Impaired Yes/No Questions: Impaired Basic Immediate Environment Questions:  (5/5) Complex Questions:  (3/5) Paragraph Comprehension (via yes/no questions):  (3/4) Commands: Impaired One Step Basic Commands:  (4/5) Two Step Basic Commands:  (1/4) Conversation: Simple Reading Comprehension Reading Status: Impaired Word level: Impaired Sentence Level: Impaired    Expression Expression Primary Mode of Expression: Verbal Verbal Expression Overall Verbal Expression: Impaired Initiation: Impaired Automatic Speech: Counting;Day of week;Month of year (Counting: 9/10; days: 3/7;; months: 0/12) Level of Generative/Spontaneous Verbalization: Phrase;Sentence Repetition: Impaired Level of Impairment: Sentence level (1/3) Naming: Impairment Responsive:  (0/4) Confrontation: Impaired (0/5) Verbal Errors: Neologisms Pragmatics: No impairment Written Expression Dominant Hand: Right   Oral / Motor  Motor Speech Overall Motor Speech: Appears within functional limits for tasks assessed Respiration: Within functional limits Phonation: Normal Resonance: Within functional limits Articulation: Within functional limitis Intelligibility: Intelligible Motor Planning: Witnin functional limits Motor Speech Errors: Not applicable   Kierstyn Baranowski I. Hardin Negus, [place], [place] Office number [phone] Pager [phone]  Horton Marshall 03/11/2020, 5:45 PM

## 2020-03-11 NOTE — Progress Notes (Signed)
Patient arrived to 4NICU with the following possessions; 1 pair of dentures.

## 2020-03-11 NOTE — Progress Notes (Signed)
Referring Physician(s): Code Stroke- Rory Percy, Weldon Picking  Supervising Physician: Pedro Earls  Patient Status:  [facility] - In-pt  Chief Complaint: None  Subjective:  History of acute CVA s/p cerebral arteriogram with emergent mechanical thrombectomy of left MCA M2 occlusion achieving a TICI 2c revascularization via right femoral approach 03/10/2020 by Dr. Karenann Cai. Patient laying in bed resting comfortably. She responds to voice and follows simple commands. No complaints. Oriented to person only. Can spontaneously move all extremities. Right groin incision c/d/i.   Allergies: Atorvastatin, Calcium, Calcium carbonate, Fluoxetine, and Hydrochlorothiazide  Medications: Prior to Admission medications   Medication Sig Start Date End Date Taking? Authorizing Provider  acetaminophen (TYLENOL) 500 MG tablet Take 1,000 mg by mouth every 6 (six) hours as needed for mild pain.    Yes [provider]  ADVAIR DISKUS 100-50 MCG/DOSE AEPB Inhale 1 puff into the lungs 2 (two) times daily.  10/29/14  Yes [provider]  albuterol (VENTOLIN HFA) 108 (90 Base) MCG/ACT inhaler Inhale 2 puffs into the lungs every 6 (six) hours as needed for wheezing or shortness of breath.   Yes [provider]  aspirin EC 81 MG tablet Take 81 mg by mouth daily.   Yes [provider]  B Complex-C-Zn-Folic Acid (DIALYVITE/ZINC) TABS Take 1 tablet by mouth daily.    Yes [provider]  diltiazem (CARDIZEM CD) 180 MG 24 hr capsule Take 180 mg by mouth daily. 09/25/19  Yes [provider]  ezetimibe (ZETIA) 10 MG tablet Take 10 mg by mouth daily.   Yes [provider]  ferrous sulfate 325 (65 FE) MG tablet Take 325 mg by mouth daily with breakfast.   Yes [provider]  furosemide (LASIX) 20 MG tablet Take 20 mg by mouth daily.  04/03/16  Yes [provider]  levothyroxine (SYNTHROID, LEVOTHROID) 50 MCG tablet Take 50 mcg by  mouth daily before breakfast.   Yes [provider]  lisinopril (PRINIVIL,ZESTRIL) 5 MG tablet Take 5 mg by mouth daily.  10/07/15  Yes [provider]  Multiple Vitamins-Minerals (CENTRUM SILVER ULTRA WOMENS) TABS Take 1 tablet by mouth daily.   Yes [provider]  Multiple Vitamins-Minerals (PRESERVISION AREDS 2 PO) Take 2 capsules by mouth daily.   Yes [provider]  potassium chloride (K-DUR,KLOR-CON) 10 MEQ tablet Take 20 mEq by mouth 2 (two) times daily.    Yes [provider]  pravastatin (PRAVACHOL) 20 MG tablet Take 20 mg by mouth at bedtime.   Yes [provider]  predniSONE (DELTASONE) 5 MG tablet Take 5 mg by mouth daily with breakfast.   Yes [provider]  tamoxifen (NOLVADEX) 20 MG tablet Take 1 tablet (20 mg total) by mouth daily. 01/11/20  Yes Corcoran, Drue Second, MD  ursodiol (ACTIGALL) 300 MG capsule Take 300 mg by mouth 2 (two) times daily.    Yes [provider]  venlafaxine XR (EFFEXOR-XR) 37.5 MG 24 hr capsule Take 37.5 mg by mouth daily. 01/14/20  Yes [provider]  Vitamin D, Ergocalciferol, (DRISDOL) 50000 units CAPS capsule Take 50,000 Units by mouth every 14 (fourteen) days.  07/31/15  Yes [provider]     Vital Signs: BP 118/68   Pulse 81   Temp (!) 97.4 F (36.3 C) (Axillary)   Resp 12   Ht 5\' 2"  (1.575 m)   SpO2 100%   BMI 33.43 kg/m   Physical Exam Vitals and nursing note reviewed.  Constitutional:  General: She is not in acute distress. Pulmonary:     Effort: Pulmonary effort is normal. No respiratory distress.  Skin:    General: Skin is warm and dry.     Comments: Right groin incision soft without active bleeding or hematoma.  Neurological:     Comments: Alert and awake, oriented to person only (unable to tell where she is or what year/date it is). Speech and comprehension intact. PERRL bilaterally. Can spontaneously move all extremities. Distal  pulses (DPs) 1+ bilaterally.     Imaging: CT HEAD CODE STROKE WO CONTRAST`  Result Date: 03/10/2020 CLINICAL DATA:  Code stroke.  Expressive aphasia. EXAM: CT HEAD WITHOUT CONTRAST TECHNIQUE: Contiguous axial images were obtained from the base of the skull through the vertex without intravenous contrast. COMPARISON:  11/03/2018 FINDINGS: Brain: There is loss of gray-white differentiation in the posterior left insula, posterior left temporal lobe, and left parietal lobe consistent with an acute infarct. No intracranial hemorrhage, mass, midline shift, or extra-axial fluid collection is identified. A small chronic left occipital infarct in the PCA territory is new from the prior CT. A small chronic right cerebellar infarct is unchanged. Hypodensities in the cerebral white matter bilaterally are similar to the prior CT and nonspecific but compatible with mild chronic small vessel ischemic disease. Mild cerebral atrophy is within normal limits for age. Vascular: Calcified atherosclerosis at the skull base. Hyperdense left MCA in the distal M1/bifurcation region. Skull: The visualized paranasal sinus that no fracture or suspicious osseous lesion. Sinuses/Orbits: Visualized paranasal sinuses and mastoid air cells are clear. Bilateral cataract extraction. Other: None. ASPECTS [facility] Stroke Program Early CT Score) - Ganglionic level infarction (caudate, lentiform nuclei, internal capsule, insula, M1-M3 cortex): 5 - Supraganglionic infarction (M4-M6 cortex): 2 Total score (0-10 with 10 being normal): 7 IMPRESSION: 1. Acute left MCA infarct with hyperdense left MCA. No intracranial hemorrhage. 2. ASPECTS is 7. These results were called by telephone at the time of interpretation on 03/10/2020 at 3:19 pm to [facility], who verbally acknowledged these results. Electronically Signed   By: Logan Bores M.D.   On: 03/10/2020 15:21   CT ANGIO HEAD CODE STROKE  Result Date: 03/10/2020 CLINICAL DATA:  Aphasia. EXAM: CT  ANGIOGRAPHY HEAD AND NECK TECHNIQUE: Multidetector CT imaging of the head and neck was performed using the standard protocol during bolus administration of intravenous contrast. Multiplanar CT image reconstructions and MIPs were obtained to evaluate the vascular anatomy. Carotid stenosis measurements (when applicable) are obtained utilizing NASCET criteria, using the distal internal carotid diameter as the denominator. CONTRAST:  50mL OMNIPAQUE IOHEXOL 350 MG/ML SOLN COMPARISON:  None. FINDINGS: CTA NECK FINDINGS Aortic arch: Standard 3 vessel aortic arch with mild atherosclerotic plaque. No significant arch vessel origin stenosis. Right carotid system: Patent without evidence of stenosis, dissection, or significant atherosclerosis. Retropharyngeal course of the proximal ICA. Left carotid system: Patent with minimal calcified plaque at the carotid bifurcation. No evidence of stenosis or dissection. Retropharyngeal course of the proximal ICA. Vertebral arteries: Patent without evidence of stenosis or dissection. Mildly dominant left vertebral artery. Skeleton: Asymmetrically advanced facet arthrosis on the left at C2-3 and C3-4 with associated grade 1 anterolisthesis. Moderate to severe multilevel cervical disc degeneration. Other neck: No evidence of cervical lymphadenopathy or mass. Upper chest: Respiratory motion artifact without apical lung consolidation or mass. Partially visualized left subclavian approach pacemaker. Review of the MIP images confirms the above findings CTA HEAD FINDINGS Anterior circulation: The internal carotid arteries are patent from skull base to carotid  termini with minimal nonstenotic plaque bilaterally. The left M1 segment is widely patent, however there is occlusion of the dominant left M2 inferior division just beyond the bifurcation with mild reconstitution of distal branch vessels. The right MCA and both ACAs are patent without evidence of a proximal branch occlusion or significant  proximal stenosis. No aneurysm is identified. Posterior circulation: The intracranial vertebral arteries are widely patent to the basilar. Patent right PICA, left AICA, and bilateral SCAs are seen. The basilar artery is widely patent. There are diminutive posterior communicating arteries. Both PCAs are patent without evidence of a significant proximal stenosis, however there is a severe distal P2 stenosis on the left. No aneurysm is identified. Venous sinuses: Patent. Anatomic variants: None. Review of the MIP images confirms the above findings IMPRESSION: 1. Proximal left M2 inferior division occlusion. 2. Severe distal left P2 stenosis. 3. Widely patent carotid and vertebral arteries. 4.  Aortic Atherosclerosis (ICD10-I70.0). These results were called by telephone at the time of interpretation on 03/10/2020 at 4:24 pm to Dr. Ruffin Frederick, who verbally acknowledged these results. Electronically Signed   By: Logan Bores M.D.   On: 03/10/2020 16:40   CT ANGIO NECK CODE STROKE  Result Date: 03/10/2020 CLINICAL DATA:  Aphasia. EXAM: CT ANGIOGRAPHY HEAD AND NECK TECHNIQUE: Multidetector CT imaging of the head and neck was performed using the standard protocol during bolus administration of intravenous contrast. Multiplanar CT image reconstructions and MIPs were obtained to evaluate the vascular anatomy. Carotid stenosis measurements (when applicable) are obtained utilizing NASCET criteria, using the distal internal carotid diameter as the denominator. CONTRAST:  21mL OMNIPAQUE IOHEXOL 350 MG/ML SOLN COMPARISON:  None. FINDINGS: CTA NECK FINDINGS Aortic arch: Standard 3 vessel aortic arch with mild atherosclerotic plaque. No significant arch vessel origin stenosis. Right carotid system: Patent without evidence of stenosis, dissection, or significant atherosclerosis. Retropharyngeal course of the proximal ICA. Left carotid system: Patent with minimal calcified plaque at the carotid bifurcation. No evidence of  stenosis or dissection. Retropharyngeal course of the proximal ICA. Vertebral arteries: Patent without evidence of stenosis or dissection. Mildly dominant left vertebral artery. Skeleton: Asymmetrically advanced facet arthrosis on the left at C2-3 and C3-4 with associated grade 1 anterolisthesis. Moderate to severe multilevel cervical disc degeneration. Other neck: No evidence of cervical lymphadenopathy or mass. Upper chest: Respiratory motion artifact without apical lung consolidation or mass. Partially visualized left subclavian approach pacemaker. Review of the MIP images confirms the above findings CTA HEAD FINDINGS Anterior circulation: The internal carotid arteries are patent from skull base to carotid termini with minimal nonstenotic plaque bilaterally. The left M1 segment is widely patent, however there is occlusion of the dominant left M2 inferior division just beyond the bifurcation with mild reconstitution of distal branch vessels. The right MCA and both ACAs are patent without evidence of a proximal branch occlusion or significant proximal stenosis. No aneurysm is identified. Posterior circulation: The intracranial vertebral arteries are widely patent to the basilar. Patent right PICA, left AICA, and bilateral SCAs are seen. The basilar artery is widely patent. There are diminutive posterior communicating arteries. Both PCAs are patent without evidence of a significant proximal stenosis, however there is a severe distal P2 stenosis on the left. No aneurysm is identified. Venous sinuses: Patent. Anatomic variants: None. Review of the MIP images confirms the above findings IMPRESSION: 1. Proximal left M2 inferior division occlusion. 2. Severe distal left P2 stenosis. 3. Widely patent carotid and vertebral arteries. 4.  Aortic Atherosclerosis (ICD10-I70.0). These results were called  by telephone at the time of interpretation on 03/10/2020 at 4:24 pm to Dr. Ruffin Frederick, who verbally acknowledged these  results. Electronically Signed   By: Logan Bores M.D.   On: 03/10/2020 16:40    Labs:  CBC: Recent Labs    06/06/19 1049 12/11/19 1001 02/23/20 1352 03/10/20 1509  WBC 10.1 12.6* 9.4 10.2  HGB 12.8 13.7 13.1 13.9  HCT 38.3 42.5 40.3 41.8  PLT 321 383 348 297    COAGS: Recent Labs    03/10/20 1509  INR 0.9  APTT 27    BMP: Recent Labs    06/06/19 1049 12/11/19 1001 03/10/20 1509  NA 138 138 137  K 4.5 3.4* 4.4  CL 101 102 98  CO2 26 25 26   GLUCOSE 100* 95 116*  BUN 31* 24* 23  CALCIUM 9.4 9.3 9.7  CREATININE 0.94 1.27* 1.17*  GFRNONAA 60* 42* 46*  GFRAA >60 48* 53*    LIVER FUNCTION TESTS: Recent Labs    12/11/19 1001 01/23/20 1004 02/23/20 1352 03/10/20 1509  BILITOT 0.6 0.7 0.6 0.8  AST 27 50* 54* 31  ALT 23 49* 63* 23  ALKPHOS 120 128* 107 103  PROT 7.3 7.8 7.6 7.3  ALBUMIN 3.9 4.3 3.9 3.9    Assessment and Plan:  History of acute CVA s/p cerebral arteriogram with emergent mechanical thrombectomy of left MCA M2 occlusion achieving a TICI 2c revascularization via right femoral approach 03/10/2020 by Dr. Karenann Cai. Patient's condition stable- appears confused this AM (oriented to self only), can spontaneously move all extremities. Right groin incision stable, distal pulses (DPs) 1+ bilaterally. Further plans per neurology- appreciate and agree with management. Please call NIR with questions/concerns.   Electronically Signed: Earley Abide, PA-C 03/11/2020, 11:10 AM   I spent a total of 25 Minutes at the the patient's bedside AND on the patient's hospital floor or unit, greater than 50% of which was counseling/coordinating care for CVA s/p revascularization.

## 2020-03-12 DIAGNOSIS — I639 Cerebral infarction, unspecified: Secondary | ICD-10-CM

## 2020-03-12 MED ORDER — ENOXAPARIN SODIUM 40 MG/0.4ML ~~LOC~~ SOLN
40.0000 mg | SUBCUTANEOUS | Status: DC
  Administered 2020-03-12 – 2020-03-15 (×4): 40 mg via SUBCUTANEOUS
  Filled 2020-03-12 (×4): qty 0.4

## 2020-03-12 NOTE — Progress Notes (Signed)
Daughter [name] informed of new unit/room number and telephone number to pt's room. (3 West/3W15)

## 2020-03-12 NOTE — Progress Notes (Signed)
Physical Therapy Treatment Patient Details Name: Adriana Martin MRN: [id number] DOB: 02/02/1946 Today's Date: 03/12/2020    History of Present Illness This 74 y.o. female transferred from [facility]  with Rt UE weakness, acute onset aphasia. TpA administered,  CTA of head showed M2 occlusion.  She underwent mechanical thrombectomy.  PMH includes: thrombocytopenia, SOB, permanent cardiac pacemaker, h/o radiation therapy due to breast CA, COPD, A-Fib, CHF, s/p Rt reverse shoulder arthroplasty, s/p MVR, s/p Rt mastectomy with axillary lymph node dissection    PT Comments    Patient progressing with gait not far from baseline.  Still struggling at times with word finding and impaired orientation and needs assist for safety.  Feel likely mobility too high level for CIR, but daughter works.  Discussed she would need 24 hour assist at d/c and daughter aware of company to contact to set up some services.  Informed case manager may also have another list for agency if additional help needed.  PT to continue to follow acutely.   Follow Up Recommendations  Home health PT     Equipment Recommendations  None recommended by PT    Recommendations for Other Services       Precautions / Restrictions Precautions Precautions: Fall Restrictions Weight Bearing Restrictions: No    Mobility  Bed Mobility   Bed Mobility: Supine to Sit     Supine to sit: Supervision;HOB elevated     General bed mobility comments: leaning up in long sitting to don her socks, then brought legs off bed and sat up with S for lines  Transfers Overall transfer level: Needs assistance   Transfers: Sit to/from Stand Sit to Stand: Supervision         General transfer comment: S for lines, safety  Ambulation/Gait Ambulation/Gait assistance: Min assist Gait Distance (Feet): 250 Feet Assistive device: 1 person hand held assist (versus wall rail and S) Gait Pattern/deviations: Step-through pattern;Decreased stride  length;Wide base of support     General Gait Details: short shuffling steps with wide BOS and increased time, needing HHA or holding wall rail for support (used cane at baseline)   Marine scientist Rankin (Stroke Patients Only) Modified Rankin (Stroke Patients Only) Pre-Morbid Rankin Score: Slight disability Modified Rankin: Moderately severe disability     Balance     Sitting balance-Leahy Scale: Good     Standing balance support: No upper extremity supported Standing balance-Leahy Scale: Fair Standing balance comment: static standing unaided at bedside                            Cognition Arousal/Alertness: Awake/alert Behavior During Therapy: Flat affect Overall Cognitive Status: Impaired/Different from baseline Area of Impairment: Orientation;Memory;Safety/judgement                 Orientation Level: Disoriented to;Place;Time;Situation Current Attention Level: Sustained Memory: Decreased short-term memory   Safety/Judgement: Decreased awareness of deficits   Problem Solving: Slow processing General Comments: unable to state where she is or why she is here, appropriate level of communication for ADL's with verbal cues and occasional gestures      Exercises      General Comments General comments (skin integrity, edema, etc.): VSS, daughter in room, discussed having 24 hour supervision at d/c (daughter works, but could hire assist)      Pertinent Vitals/Pain Pain Assessment: No/denies pain    Home  Living                      Prior Function            PT Goals (current goals can now be found in the care plan section) Progress towards PT goals: Progressing toward goals    Frequency    Min 4X/week      PT Plan Discharge plan needs to be updated    Co-evaluation              AM-PAC PT "6 Clicks" Mobility   Outcome Measure  Help needed turning from your back to your side  while in a flat bed without using bedrails?: None Help needed moving from lying on your back to sitting on the side of a flat bed without using bedrails?: None Help needed moving to and from a bed to a chair (including a wheelchair)?: A Little Help needed standing up from a chair using your arms (e.g., wheelchair or bedside chair)?: None Help needed to walk in hospital room?: A Little Help needed climbing 3-5 steps with a railing? : A Little 6 Click Score: 21    End of Session   Activity Tolerance: Patient tolerated treatment well Patient left: in chair;with call bell/phone within reach;with chair alarm set;with family/visitor present   PT Visit Diagnosis: Other abnormalities of gait and mobility (R26.89);Other symptoms and signs involving the nervous system (R29.898)     Time: 0263-7858 PT Time Calculation (min) (ACUTE ONLY): 25 min  Charges:  $Gait Training: 8-22 mins $Therapeutic Activity: 8-22 mins                     Magda Kiel, PT Acute Rehabilitation Services [phone] Office:[phone] 03/12/2020    Adriana Martin 03/12/2020, 10:29 AM

## 2020-03-12 NOTE — Progress Notes (Signed)
Report completed to RN Anderson Malta on unit 3West. Will contact daughter [name] to inform her of new room number (she was here with pt this morning).  Will transfer pt via wheelchair with belongings.

## 2020-03-12 NOTE — Progress Notes (Signed)
Attempted to call report to [facility] nurse, pt will transfer to room 3W15.  Nurse not available a this moment, will try back shortly.

## 2020-03-12 NOTE — Consult Note (Signed)
Physical Medicine and Rehabilitation Consult Reason for Consult: Right side weakness with aphasia Referring Physician: Dr. Leonie Man   HPI: Adriana Martin is a 74 y.o. right-handed female with history of recurrent breast cancer undergoing radiation with last dose 03/08/2020, primary biliary cirrhosis, MUGA's, atrial fibrillation on aspirin anticoagulation discontinued due to fall risk, hypertension, hyperlipidemia, CKD, COPD.  Per chart review daughter has been living with patient since December while patient was undergoing radiation therapy.  1 level home ramped entrance.  Patient was able to perform ADLs modified independent use with straight point cane.  Presented 03/10/2020 to [facility] with right-sided weakness and aphasia.  CT the head revealed hypoattenuation in the left MCA distribution with hyperdense distal left M1 segment.  No intracranial mets noted.  Patient did receive TPA.  Patient was transferred to [facility] for further evaluation.  CTA head and neck proximal left M2 inferior division occlusion.  Severe distal L P2 stenosis.  Patient underwent complete revascularization per interventional radiology.  Echocardiogram with ejection fraction of 55 to 60% without embolus.  Currently maintained on aspirin for CVA prophylaxis.  Blood pressure monitored initially on Cleviprex.  Lovenox initiated for DVT prophylaxis.  Tolerating a regular diet.  Therapy evaluations completed with recommendations of physical medicine rehab consult.   Pt reported she didn't know why she was here- knew she has breast CA and was being treated, but didn't appear to be aware of stroke;  Said her LBM was today and voiding OK. Denied pain.  Says is moving "pretty well"- which I agree with on exam, and admits daughter living with her lately- not driving currently and quit smoking "long ago".     Review of Systems  Constitutional: Negative for chills and fever.  HENT: Negative for hearing loss.   Eyes:  Negative for blurred vision and double vision.  Respiratory: Negative for cough and shortness of breath.   Cardiovascular: Positive for palpitations.  Gastrointestinal: Positive for constipation. Negative for heartburn, nausea and vomiting.       GERD  Genitourinary: Negative for dysuria, flank pain and hematuria.  Musculoskeletal: Positive for joint pain and myalgias.  Skin: Negative for rash.  Neurological: Positive for speech change and weakness.  Psychiatric/Behavioral: Positive for depression.       Anxiety  All other systems reviewed and are negative.  Past Medical History:  Diagnosis Date  . Anemia   . Anxiety   . Atrial fibrillation ([place])   . B12 deficiency   . Breast cancer ([place])    RT Lumpectomy c radiation 2012  . Breast cancer ([place])   . CHF (congestive heart failure) ([place])   . Chronic kidney disease   . Cirrhosis ([place])   . COPD (chronic obstructive pulmonary disease) ([place])   . Depression   . Family history of breast cancer   . Family history of esophageal cancer   . Family history of multiple myeloma   . GERD (gastroesophageal reflux disease)   . Headache   . Heart murmur   . Hyperlipemia   . Hyperlipidemia   . Hypertension   . Hypothyroidism   . Iron (Fe) deficiency anemia   . Personal history of radiation therapy   . Presence of permanent cardiac pacemaker   . Shortness of breath dyspnea   . Thrombocytopenia ([place])   . Vitamin D deficiency    Past Surgical History:  Procedure Laterality Date  . ABDOMINAL HYSTERECTOMY     Partial  . ABLATION    .  APPENDECTOMY    . BICEPT TENODESIS Right 01/07/2018   Procedure: BICEPS TENODESIS;  Surgeon: Leim Fabry, MD;  Location: [facility] ORS;  Service: Orthopedics;  Laterality: Right;  . BREAST BIOPSY Right 2012  . BREAST BIOPSY Right 07/21/2019   venus clip, [place] Bx, pending path   . BREAST LUMPECTOMY Right 2012   positive/rad  . BREAST SURGERY    . CARDIAC SURGERY    . CARDIAC VALVE REPLACEMENT    . COLONOSCOPY   2012  . ELECTROPHYSIOLOGIC STUDY N/A 01/23/2015   Procedure: CARDIOVERSION;  Surgeon: Corey Skains, MD;  Location: [facility] ORS;  Service: Cardiovascular;  Laterality: N/A;  . ELECTROPHYSIOLOGIC STUDY N/A 05/27/2016   Procedure: CARDIOVERSION;  Surgeon: Corey Skains, MD;  Location: [facility] ORS;  Service: Cardiovascular;  Laterality: N/A;  . ESOPHAGOGASTRODUODENOSCOPY  2012  . EYE SURGERY    . IR CT HEAD LTD  03/10/2020  . IR PERCUTANEOUS ART THROMBECTOMY/INFUSION INTRACRANIAL INC DIAG ANGIO  03/10/2020  . IR US GUIDE VASC ACCESS RIGHT  03/10/2020  . MASTECTOMY WITH AXILLARY LYMPH NODE DISSECTION Right 08/09/2019   Procedure: MASTECTOMY WITH AXILLARY LYMPH NODE DISSECTION;  Surgeon: Robert Bellow, MD;  Location: [facility] ORS;  Service: General;  Laterality: Right;  . MITRAL VALVE REPLACEMENT    . mvp    . RADIOLOGY WITH ANESTHESIA N/A 03/10/2020   Procedure: IR WITH ANESTHESIA;  Surgeon: Radiologist, Medication, MD;  Location: [place];  Service: Radiology;  Laterality: N/A;  . REVERSE SHOULDER ARTHROPLASTY Right 01/07/2018   Procedure: REVERSE SHOULDER ARTHROPLASTY;  Surgeon: Leim Fabry, MD;  Location: [facility] ORS;  Service: Orthopedics;  Laterality: Right;  Marland Kitchen VSD REPAIR     Family History  Problem Relation Age of Onset  . Breast cancer Mother        never treated, dx. >50  . Multiple myeloma Father   . Esophageal cancer Brother   . Breast cancer Daughter 47  . Cancer Daughter 58       rare blood cancer   Social History:  reports that she quit smoking about 21 years ago. Her smoking use included cigarettes. She has a 7.50 pack-year smoking history. She has never used smokeless tobacco. She reports that she does not drink alcohol and does not use drugs. Allergies:  Allergies  Allergen Reactions  . Atorvastatin Shortness Of Breath  . Calcium Shortness Of Breath  . Calcium Carbonate     Other reaction(s): Unknown  . Fluoxetine     Other reaction(s): Unknown  . Hydrochlorothiazide     Other  reaction(s): Unknown   Medications Prior to Admission  Medication Sig Dispense Refill  . acetaminophen (TYLENOL) 500 MG tablet Take 1,000 mg by mouth every 6 (six) hours as needed for mild pain.     Marland Kitchen ADVAIR DISKUS 100-50 MCG/DOSE AEPB Inhale 1 puff into the lungs 2 (two) times daily.   0  . albuterol (VENTOLIN HFA) 108 (90 Base) MCG/ACT inhaler Inhale 2 puffs into the lungs every 6 (six) hours as needed for wheezing or shortness of breath.    Marland Kitchen aspirin EC 81 MG tablet Take 81 mg by mouth daily.    . B Complex-C-Zn-Folic Acid (DIALYVITE/ZINC) TABS Take 1 tablet by mouth daily.     Marland Kitchen diltiazem (CARDIZEM CD) 180 MG 24 hr capsule Take 180 mg by mouth daily.    Marland Kitchen ezetimibe (ZETIA) 10 MG tablet Take 10 mg by mouth daily.    . ferrous sulfate 325 (65 FE) MG tablet Take 325 mg  by mouth daily with breakfast.    . furosemide (LASIX) 20 MG tablet Take 20 mg by mouth daily.   1  . levothyroxine (SYNTHROID, LEVOTHROID) 50 MCG tablet Take 50 mcg by mouth daily before breakfast.    . lisinopril (PRINIVIL,ZESTRIL) 5 MG tablet Take 5 mg by mouth daily.   98  . Multiple Vitamins-Minerals (CENTRUM SILVER ULTRA WOMENS) TABS Take 1 tablet by mouth daily.    . Multiple Vitamins-Minerals (PRESERVISION AREDS 2 PO) Take 2 capsules by mouth daily.    . potassium chloride (K-DUR,KLOR-CON) 10 MEQ tablet Take 20 mEq by mouth 2 (two) times daily.     . pravastatin (PRAVACHOL) 20 MG tablet Take 20 mg by mouth at bedtime.    . predniSONE (DELTASONE) 5 MG tablet Take 5 mg by mouth daily with breakfast.    . tamoxifen (NOLVADEX) 20 MG tablet Take 1 tablet (20 mg total) by mouth daily. 30 tablet 3  . ursodiol (ACTIGALL) 300 MG capsule Take 300 mg by mouth 2 (two) times daily.     Marland Kitchen venlafaxine XR (EFFEXOR-XR) 37.5 MG 24 hr capsule Take 37.5 mg by mouth daily.    . Vitamin D, Ergocalciferol, (DRISDOL) 50000 units CAPS capsule Take 50,000 Units by mouth every 14 (fourteen) days.       Home: Home Living Family/patient  expects to be discharged to:: Private residence Living Arrangements: Children Available Help at Discharge: Family, Available 24 hours/day Type of Home: Mobile home Home Access: Ramped entrance [place]: One level Bathroom Shower/Tub: Multimedia programmer: Standard Bathroom Accessibility: Yes Home Equipment: Environmental consultant - 4 wheels, [place] - single point, Shower seat, Other (comment) Additional Comments: Pt's daughter has been living with her since December while pt undergoes radiation therapy   Lives With: Daughter  Functional History: Prior Function Level of Independence: Independent with assistive device(s) Comments: Pt was able to perform ADLs mod I.  Uses SPC.  Does not drive.  Has a life alert.   no falls in the last 6 mos  Functional Status:  Mobility: Bed Mobility Overal bed mobility: Needs Assistance Bed Mobility: Supine to Sit Supine to sit: Min guard General bed mobility comments: Pt received in recliner. Transfers Overall transfer level: Needs assistance Equipment used: 1 person hand held assist Transfers: Sit to/from Stand Sit to Stand: Min assist Stand pivot transfers: Min assist General transfer comment: assist for balance  Ambulation/Gait Ambulation/Gait assistance: Min assist Gait Distance (Feet): 50 Feet Assistive device: 1 person hand held assist Gait Pattern/deviations: Step-through pattern, Decreased stride length, Shuffle General Gait Details: short, shuffle steps, mildly unsteady Gait velocity: decreased Gait velocity interpretation: <1.31 ft/sec, indicative of household ambulator    ADL: ADL Overall ADL's : Needs assistance/impaired Eating/Feeding: Set up, Sitting Grooming: Wash/dry hands, Wash/dry face, Brushing hair, Minimal assistance, Sitting Upper Body Bathing: Minimal assistance, Sitting Lower Body Bathing: Minimal assistance, Sit to/from stand Upper Body Dressing : Minimal assistance, Sitting Lower Body Dressing: Minimal assistance,  Sit to/from stand Toilet Transfer: Minimal assistance, Ambulation, Comfort height toilet Toileting- Clothing Manipulation and Hygiene: Minimal assistance, Sit to/from stand Functional mobility during ADLs: Minimal assistance  Cognition: Cognition Overall Cognitive Status: Difficult to assess (due to aphasia) Orientation Level: Oriented to person, Disoriented to place, Disoriented to time, Disoriented to situation Cognition Arousal/Alertness: Awake/alert Behavior During Therapy: Flat affect Overall Cognitive Status: Difficult to assess (due to aphasia) Area of Impairment: Attention, Problem solving Current Attention Level: Sustained Problem Solving: Slow processing General Comments: Pt demonstrates flat affect.  She follows  one setp commands consistently with multimodal cues, but unable to follow 2 step commands Difficult to assess due to: Impaired communication  Blood pressure (!) 117/57, pulse 79, temperature 98.4 F (36.9 C), temperature source Axillary, resp. rate 16, height _0  (1.575 m), SpO2 94 %. Physical Exam  Nursing note and vitals reviewed. Constitutional:  Non-toxic appearance. No distress.  Pt is elderly appearing woman- couldn't understand why being examined - either very HOH, confused, or has significant aphasia- hard to tease out, since sometimes speaking well and other times, appeared to have severe anomia, NAD  HENT:  Head: Normocephalic and atraumatic.  Right Ear: External ear normal.  Left Ear: External ear normal.  Nose: Nose normal. No rhinorrhea or congestion.  Mouth/Throat: Mucous membranes are moist. No oropharyngeal exudate. Oropharynx is clear.  Eyes:  EOMs intact b/L nystagmus to L   Cardiovascular:  RRR; no M/R/G  GI: Normal appearance.  Soft, NT, ND, (+)BS   Musculoskeletal:     Cervical back: Normal range of motion and neck supple.     Comments: RUE- 4+/5 in deltoid, biceps, triceps and 5-/5 in WE, finger abd and grip LUE- 5-/5 in same  muscles RLE_ 5-/5 in HF, KE, KF DF and PF LLE_ 5/5 in same muscles Mild differences- pt said didn't feel weak  Neurological:  Patient is alert.  Makes eye contact with examiner.  Demonstrates some receptive expressive aphasia.  She was able to provide her name and follows some simple commands. Specifically worse with anomia Also maybe HOH Intact to light touch in all 4 extremities   Skin: Skin is warm and dry.  Psychiatric: Her behavior is normal. Mood normal.    Results for orders placed or performed during the hospital encounter of 03/10/20 (from the past 24 hour(s))  Hemoglobin A1c     Status: Abnormal   Collection Time: 03/11/20  5:52 AM  Result Value Ref Range   Hgb A1c MFr Bld 5.7 (H) 4.8 - 5.6 %   Mean Plasma Glucose 117 mg/dL  Lipid panel     Status: Abnormal   Collection Time: 03/11/20  5:52 AM  Result Value Ref Range   Cholesterol 202 (H) 0 - 200 mg/dL   Triglycerides 97 <150 mg/dL   HDL 54 >40 mg/dL   Total CHOL/HDL Ratio 3.7 RATIO   VLDL 19 0 - 40 mg/dL   LDL Cholesterol 129 (H) 0 - 99 mg/dL   CT HEAD WO CONTRAST  Result Date: 03/11/2020 CLINICAL DATA:  Stroke post thrombectomy, follow-up EXAM: CT HEAD WITHOUT CONTRAST TECHNIQUE: Contiguous axial images were obtained from the base of the skull through the vertex without intravenous contrast. COMPARISON:  03/10/2020 FINDINGS: Brain: Hypoattenuation and loss of gray-white differentiation is present in the left parietotemporal lobes and posterior insula. This is more apparent than on the prior study keeping with expected evolution. There is some ill-defined areas hyperdensity, which may reflect petechial hemorrhage. There is no discrete hematoma. No significant mass effect. Chronic left occipital infarct. Small chronic right cerebellar infarct. Stable findings of chronic microvascular ischemic changes. Vascular: There remains hyperdensity along left MCA branches within sylvian fissure. Skull: No acute abnormality.  Sinuses/Orbits: No acute abnormality. Other: None. IMPRESSION: Evolving acute left MCA territory infarct. Some ill-defined areas of hyperdensity may reflect mild petechial hemorrhage. No discrete hematoma. No significant mass effect. Electronically Signed   By: Macy Mis M.D.   On: 03/11/2020 17:00   IR CT Head Ltd  Result Date: 03/11/2020 INDICATION: 74 year old female with past  medical history significant for recurrent breast cancer undergoing radiation with last dose 03/08/2020, primary biliary cirrhosis, MGUS, A. 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 She was last seen well at 1 p.m. on 03/10/2020. Head CT showed early ischemic changes in the left MCA territory without hemorrhage. CT angiogram of the head and neck showed a proximal occlusion of a dominant left M2/MCA posterior division branch. Intravenous tPA was given at 15:47 at outside hospital and she was then transferred to our service for emergency mechanical thrombectomy. EXAM: Diagnostic cerebral angiogram and mechanical thrombectomy COMPARISON:  CT/CT angiogram of the head and neck March 10, 2020 MEDICATIONS: No antibiotics administered. ANESTHESIA/SEDATION: The procedure was performed and general anesthesia. FLUOROSCOPY TIME:  Fluoroscopy Time: 9 minutes 24 seconds (420.7 mGy). COMPLICATIONS: None immediate. TECHNIQUE: Informed written consent was obtained from the patient after a thorough discussion of the procedural risks, benefits and alternatives. All questions were addressed. Maximal Sterile Barrier Technique was utilized including caps, mask, sterile gowns, sterile gloves, sterile drape, hand hygiene and skin antiseptic. A timeout was performed prior to the initiation of the procedure. Real-time ultrasound guidance was utilized for vascular access including the  acquisition of a permanent ultrasound image documenting patency of the accessed vessel. The right groin was prepped and draped in the usual sterile fashion. Using a micropuncture kit and the modified Seldinger technique, access was gained to the right common femoral artery and an 8 French sheath was placed. Then, a Zoom 88 guide catheter was navigated over a 6 [place] Berenstein 2 catheter and a 0.035 inch Terumo Glidewire into the aortic arch under fluoroscopic guidance. The catheter was placed into the left common carotid artery. Frontal and lateral road map were obtained. The Berenstein catheter was removed. Using biplane roadmap, a Zoom 71 aspiration catheter was navigated over a 0.014 inch Aristotle microguidewire into the upper cervical segment of the left ICA. Frontal and lateral angiograms of the head were obtained. FINDINGS: 1. Proximal occlusion of a dominant left M2/MCA posterior division branch with minimal collateral flow from the left ACA to the left MCA territory. 2. Distal occlusion is of a right M4 MCA anterior division branch. PROCEDURE: Under biplane roadmap, the Zoom 71 aspiration catheter was navigated into the left M1 segment over the Aristotle guide wire. The wire was removed and the catheter was connected to an aspiration and advanced into the M2 segment. Continuous aspiration was maintained for 3 minutes. The aspiration catheter was then removed while maintaining continuous aspiration. The guiding catheter was aspirated and brisk back blood flow was noted. Magnified left ICA angiograms with frontal and lateral views of the head were obtained showing complete recanalization of the M2 segment. Left ICA angiograms with frontal and lateral views of the entire head showed complete recanalization of the left MCA M2 segment with persistent slow flow within a left M4/MCA anterior division branch (TICI2C). No embolus to distal or new territory. Flat panel CT of the head was obtained and post processed  in a separate workstation with concurrent attending physician supervision. Selected images were sent to PACS. No hemorrhagic complication noted. A right common femoral artery angiogram was performed. The access is in the mid common femoral artery. The femoral sheath was exchanged over the wire for a Perclose ProGlide which was utilized for access closure. Immediate hemostasis was achieved. IMPRESSION: 1. Successful mechanical thrombectomy for treatment of a  proximal occlusion of a dominant left M2/MCA posterior division branch with direct contact aspiration. Complete recanalization of the posterior division achieved with persistent slow flow in a left M4/MCA anterior division branch (TICI2C). 2. No embolus to new or distal territory. 3. No hemorrhagic complication on postprocedural flat panel CT. PLAN: - Bed rest post femoral access 6h- SBP 120-140- STAT head CT for any acute neurological deterioration- ICU level of care Electronically Signed   By: Pedro Earls M.D.   On: 03/11/2020 11:15   IR US Guide Vasc Access Right  Result Date: 03/11/2020 INDICATION: 74 year old female with past medical history significant for recurrent breast cancer undergoing radiation with last dose 03/08/2020, primary biliary cirrhosis, MGUS, A. 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 She was last seen well at 1 p.m. on 03/10/2020. Head CT showed early ischemic changes in the left MCA territory without hemorrhage. CT angiogram of the head and neck showed a proximal occlusion of a dominant left M2/MCA posterior division branch. Intravenous tPA was given at 15:47 at outside hospital and she was then transferred to our service for emergency mechanical thrombectomy. EXAM: Diagnostic cerebral angiogram and mechanical thrombectomy COMPARISON:  CT/CT  angiogram of the head and neck March 10, 2020 MEDICATIONS: No antibiotics administered. ANESTHESIA/SEDATION: The procedure was performed and general anesthesia. FLUOROSCOPY TIME:  Fluoroscopy Time: 9 minutes 24 seconds (420.7 mGy). COMPLICATIONS: None immediate. TECHNIQUE: Informed written consent was obtained from the patient after a thorough discussion of the procedural risks, benefits and alternatives. All questions were addressed. Maximal Sterile Barrier Technique was utilized including caps, mask, sterile gowns, sterile gloves, sterile drape, hand hygiene and skin antiseptic. A timeout was performed prior to the initiation of the procedure. Real-time ultrasound guidance was utilized for vascular access including the acquisition of a permanent ultrasound image documenting patency of the accessed vessel. The right groin was prepped and draped in the usual sterile fashion. Using a micropuncture kit and the modified Seldinger technique, access was gained to the right common femoral artery and an 8 French sheath was placed. Then, a Zoom 88 guide catheter was navigated over a 6 [place] Berenstein 2 catheter and a 0.035 inch Terumo Glidewire into the aortic arch under fluoroscopic guidance. The catheter was placed into the left common carotid artery. Frontal and lateral road map were obtained. The Berenstein catheter was removed. Using biplane roadmap, a Zoom 71 aspiration catheter was navigated over a 0.014 inch Aristotle microguidewire into the upper cervical segment of the left ICA. Frontal and lateral angiograms of the head were obtained. FINDINGS: 1. Proximal occlusion of a dominant left M2/MCA posterior division branch with minimal collateral flow from the left ACA to the left MCA territory. 2. Distal occlusion is of a right M4 MCA anterior division branch. PROCEDURE: Under biplane roadmap, the Zoom 71 aspiration catheter was navigated into the left M1 segment over the Aristotle guide wire. The wire was removed  and the catheter was connected to an aspiration and advanced into the M2 segment. Continuous aspiration was maintained for 3 minutes. The aspiration catheter was then removed while maintaining continuous aspiration. The guiding catheter was aspirated and brisk back blood flow was noted. Magnified left ICA angiograms with frontal and lateral views of the head were obtained showing complete recanalization of the M2 segment. Left ICA angiograms with frontal and lateral views of the entire head showed  complete recanalization of the left MCA M2 segment with persistent slow flow within a left M4/MCA anterior division branch (TICI2C). No embolus to distal or new territory. Flat panel CT of the head was obtained and post processed in a separate workstation with concurrent attending physician supervision. Selected images were sent to PACS. No hemorrhagic complication noted. A right common femoral artery angiogram was performed. The access is in the mid common femoral artery. The femoral sheath was exchanged over the wire for a Perclose ProGlide which was utilized for access closure. Immediate hemostasis was achieved. IMPRESSION: 1. Successful mechanical thrombectomy for treatment of a proximal occlusion of a dominant left M2/MCA posterior division branch with direct contact aspiration. Complete recanalization of the posterior division achieved with persistent slow flow in a left M4/MCA anterior division branch (TICI2C). 2. No embolus to new or distal territory. 3. No hemorrhagic complication on postprocedural flat panel CT. PLAN: - Bed rest post femoral access 6h- SBP 120-140- STAT head CT for any acute neurological deterioration- ICU level of care Electronically Signed   By: Pedro Earls M.D.   On: 03/11/2020 11:15   ECHOCARDIOGRAM COMPLETE  Result Date: 03/11/2020    ECHOCARDIOGRAM REPORT   Patient Name:   Adriana Martin Date of Exam: 03/11/2020 Medical Rec #:  [id number]        Height:       62.0 in  Accession #:    [id number]       Weight:       182.8 lb Date of Birth:  [birth year]        BSA:          1.840 m Patient Age:    71 years         BP:           97/77 mmHg Patient Gender: F                HR:           84 bpm. Exam Location:  Inpatient Procedure: 2D Echo Indications:    Stroke I163.9  History:        Patient has prior history of Echocardiogram examinations, most                 recent 06/07/2016. CHF, COPD, Arrythmias:Atrial Fibrillation;                 Risk Factors:Hypertension and Dyslipidemia.                  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  Sonographer:    Mikki Santee RDCS (AE) Referring Phys: [id number] ASHISH ARORA IMPRESSIONS  1. Akinesis of the distal inferior wall with overall normal LV function; s/p bioprosthetic MVR with mean gradient 10 mmHg and trace MR; mild LAE.  2. Left ventricular ejection fraction, by estimation, is 55 to 60%. The left ventricle has normal function. The left ventricle demonstrates regional wall motion abnormalities (see scoring diagram/findings for description). Left ventricular diastolic parameters are indeterminate.  3. Right ventricular systolic function is normal. The right ventricular size is normal. Tricuspid regurgitation signal is inadequate for assessing PA pressure.  4. The mitral valve is normal in structure. Trivial mitral valve regurgitation. No evidence of mitral stenosis. There is a bioprosthetic valve present in the mitral position.  5. The aortic valve is tricuspid. Aortic valve regurgitation  is not visualized. No aortic stenosis is present.  6. The inferior vena cava is normal in size with greater than 50% respiratory variability, suggesting right atrial pressure of 3 mmHg. FINDINGS  Left Ventricle: Left ventricular ejection fraction, by estimation, is 55 to 60%. The left ventricle has normal function. The left ventricle demonstrates regional wall motion abnormalities. The left  ventricular internal cavity size was normal in size. There is no left ventricular hypertrophy. Left ventricular diastolic parameters are indeterminate. Right Ventricle: The right ventricular size is normal. Right ventricular systolic function is normal. Tricuspid regurgitation signal is inadequate for assessing PA pressure. The tricuspid regurgitant velocity is 1.79 m/s, and with an assumed right atrial  pressure of 3 mmHg, the estimated right ventricular systolic pressure is 19.5 mmHg. Left Atrium: Left atrial size was normal in size. Right Atrium: Right atrial size was normal in size. Pericardium: There is no evidence of pericardial effusion. Mitral Valve: The mitral valve is normal in structure. Normal mobility of the mitral valve leaflets. Moderate mitral annular calcification. Trivial mitral valve regurgitation. There is a bioprosthetic valve present in the mitral position. No evidence of mitral valve stenosis. MV peak gradient, 20.1 mmHg. The mean mitral valve gradient is 10.0 mmHg. Tricuspid Valve: The tricuspid valve is normal in structure. Tricuspid valve regurgitation is trivial. No evidence of tricuspid stenosis. Aortic Valve: The aortic valve is tricuspid. Aortic valve regurgitation is not visualized. No aortic stenosis is present. Aortic valve mean gradient measures 7.0 mmHg. Aortic valve peak gradient measures 15.8 mmHg. Aortic valve area, by VTI measures 1.86  cm. Pulmonic Valve: The pulmonic valve was normal in structure. Pulmonic valve regurgitation is not visualized. No evidence of pulmonic stenosis. Aorta: The aortic root is normal in size and structure. Venous: The inferior vena cava is normal in size with greater than 50% respiratory variability, suggesting right atrial pressure of 3 mmHg. IAS/Shunts: No atrial level shunt detected by color flow Doppler. Additional Comments: Akinesis of the distal inferior wall with overall normal LV function; s/p bioprosthetic MVR with mean gradient 10 mmHg  and trace MR; mild LAE. A pacer wire is visualized.  LEFT VENTRICLE PLAX 2D LVIDd:         4.15 cm LVIDs:         3.20 cm LV PW:         1.10 cm LV IVS:        0.95 cm LVOT diam:     2.00 cm LV SV:         63 LV SV Index:   34 LVOT Area:     3.14 cm  LEFT ATRIUM             Index       RIGHT ATRIUM           Index LA diam:        4.60 cm 2.50 cm/m  RA Area:     15.40 cm LA Vol (A2C):   53.7 ml 29.19 ml/m RA Volume:   32.40 ml  17.61 ml/m LA Vol (A4C):   63.6 ml 34.57 ml/m LA Biplane Vol: 58.2 ml 31.63 ml/m  AORTIC VALVE AV Area (Vmax):    2.02 cm AV Area (Vmean):   1.94 cm AV Area (VTI):     1.86 cm AV Vmax:           199.00 cm/s AV Vmean:          120.000 cm/s AV VTI:  0.337 m AV Peak Grad:      15.8 mmHg AV Mean Grad:      7.0 mmHg LVOT Vmax:         128.00 cm/s LVOT Vmean:        74.200 cm/s LVOT VTI:          0.200 m LVOT/AV VTI ratio: 0.59  AORTA Ao Root diam: 3.10 cm MITRAL VALVE              TRICUSPID VALVE MV Area (PHT): 1.91 cm   TR Peak grad:   12.8 mmHg MV Peak grad:  20.1 mmHg  TR Vmax:        179.00 cm/s MV Mean grad:  10.0 mmHg MV Vmax:       2.24 m/s   SHUNTS MV Vmean:      151.5 cm/s Systemic VTI:  0.20 m                           Systemic Diam: 2.00 cm Kirk Ruths MD Electronically signed by Kirk Ruths MD Signature Date/Time: 03/11/2020/4:12:16 PM    Final    IR PERCUTANEOUS ART THROMBECTOMY/INFUSION INTRACRANIAL INC DIAG ANGIO  Result Date: 03/11/2020 INDICATION: 74 year old female with past medical history significant for recurrent breast cancer undergoing radiation with last dose 03/08/2020, primary biliary cirrhosis, MGUS, A. 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 She was last seen well at 1 p.m. on 03/10/2020. Head CT showed early ischemic changes in the left MCA territory without hemorrhage.  CT angiogram of the head and neck showed a proximal occlusion of a dominant left M2/MCA posterior division branch. Intravenous tPA was given at 15:47 at outside hospital and she was then transferred to our service for emergency mechanical thrombectomy. EXAM: Diagnostic cerebral angiogram and mechanical thrombectomy COMPARISON:  CT/CT angiogram of the head and neck March 10, 2020 MEDICATIONS: No antibiotics administered. ANESTHESIA/SEDATION: The procedure was performed and general anesthesia. FLUOROSCOPY TIME:  Fluoroscopy Time: 9 minutes 24 seconds (420.7 mGy). COMPLICATIONS: None immediate. TECHNIQUE: Informed written consent was obtained from the patient after a thorough discussion of the procedural risks, benefits and alternatives. All questions were addressed. Maximal Sterile Barrier Technique was utilized including caps, mask, sterile gowns, sterile gloves, sterile drape, hand hygiene and skin antiseptic. A timeout was performed prior to the initiation of the procedure. Real-time ultrasound guidance was utilized for vascular access including the acquisition of a permanent ultrasound image documenting patency of the accessed vessel. The right groin was prepped and draped in the usual sterile fashion. Using a micropuncture kit and the modified Seldinger technique, access was gained to the right common femoral artery and an 8 French sheath was placed. Then, a Zoom 88 guide catheter was navigated over a 6 [place] Berenstein 2 catheter and a 0.035 inch Terumo Glidewire into the aortic arch under fluoroscopic guidance. The catheter was placed into the left common carotid artery. Frontal and lateral road map were obtained. The Berenstein catheter was removed. Using biplane roadmap, a Zoom 71 aspiration catheter was navigated over a 0.014 inch Aristotle microguidewire into the upper cervical segment of the left ICA. Frontal and lateral angiograms of the head were obtained. FINDINGS: 1. Proximal occlusion of a dominant  left M2/MCA posterior division branch with minimal collateral flow from the left ACA to the left MCA territory. 2.  Distal occlusion is of a right M4 MCA anterior division branch. PROCEDURE: Under biplane roadmap, the Zoom 71 aspiration catheter was navigated into the left M1 segment over the Aristotle guide wire. The wire was removed and the catheter was connected to an aspiration and advanced into the M2 segment. Continuous aspiration was maintained for 3 minutes. The aspiration catheter was then removed while maintaining continuous aspiration. The guiding catheter was aspirated and brisk back blood flow was noted. Magnified left ICA angiograms with frontal and lateral views of the head were obtained showing complete recanalization of the M2 segment. Left ICA angiograms with frontal and lateral views of the entire head showed complete recanalization of the left MCA M2 segment with persistent slow flow within a left M4/MCA anterior division branch (TICI2C). No embolus to distal or new territory. Flat panel CT of the head was obtained and post processed in a separate workstation with concurrent attending physician supervision. Selected images were sent to PACS. No hemorrhagic complication noted. A right common femoral artery angiogram was performed. The access is in the mid common femoral artery. The femoral sheath was exchanged over the wire for a Perclose ProGlide which was utilized for access closure. Immediate hemostasis was achieved. IMPRESSION: 1. Successful mechanical thrombectomy for treatment of a proximal occlusion of a dominant left M2/MCA posterior division branch with direct contact aspiration. Complete recanalization of the posterior division achieved with persistent slow flow in a left M4/MCA anterior division branch (TICI2C). 2. No embolus to new or distal territory. 3. No hemorrhagic complication on postprocedural flat panel CT. PLAN: - Bed rest post femoral access 6h- SBP 120-140- STAT head CT for  any acute neurological deterioration- ICU level of care Electronically Signed   By: Pedro Earls M.D.   On: 03/11/2020 11:15   CT HEAD CODE STROKE WO CONTRAST`  Result Date: 03/10/2020 CLINICAL DATA:  Code stroke.  Expressive aphasia. EXAM: CT HEAD WITHOUT CONTRAST TECHNIQUE: Contiguous axial images were obtained from the base of the skull through the vertex without intravenous contrast. COMPARISON:  11/03/2018 FINDINGS: Brain: There is loss of gray-white differentiation in the posterior left insula, posterior left temporal lobe, and left parietal lobe consistent with an acute infarct. No intracranial hemorrhage, mass, midline shift, or extra-axial fluid collection is identified. A small chronic left occipital infarct in the PCA territory is new from the prior CT. A small chronic right cerebellar infarct is unchanged. Hypodensities in the cerebral white matter bilaterally are similar to the prior CT and nonspecific but compatible with mild chronic small vessel ischemic disease. Mild cerebral atrophy is within normal limits for age. Vascular: Calcified atherosclerosis at the skull base. Hyperdense left MCA in the distal M1/bifurcation region. Skull: The visualized paranasal sinus that no fracture or suspicious osseous lesion. Sinuses/Orbits: Visualized paranasal sinuses and mastoid air cells are clear. Bilateral cataract extraction. Other: None. ASPECTS [facility] Stroke Program Early CT Score) - Ganglionic level infarction (caudate, lentiform nuclei, internal capsule, insula, M1-M3 cortex): 5 - Supraganglionic infarction (M4-M6 cortex): 2 Total score (0-10 with 10 being normal): 7 IMPRESSION: 1. Acute left MCA infarct with hyperdense left MCA. No intracranial hemorrhage. 2. ASPECTS is 7. These results were called by telephone at the time of interpretation on 03/10/2020 at 3:19 pm to [facility], who verbally acknowledged these results. Electronically Signed   By: Logan Bores M.D.   On:  03/10/2020 15:21   CT ANGIO HEAD CODE STROKE  Result Date: 03/10/2020 CLINICAL DATA:  Aphasia. EXAM: CT ANGIOGRAPHY HEAD AND  NECK TECHNIQUE: Multidetector CT imaging of the head and neck was performed using the standard protocol during bolus administration of intravenous contrast. Multiplanar CT image reconstructions and MIPs were obtained to evaluate the vascular anatomy. Carotid stenosis measurements (when applicable) are obtained utilizing NASCET criteria, using the distal internal carotid diameter as the denominator. CONTRAST:  38m OMNIPAQUE IOHEXOL 350 MG/ML SOLN COMPARISON:  None. FINDINGS: CTA NECK FINDINGS Aortic arch: Standard 3 vessel aortic arch with mild atherosclerotic plaque. No significant arch vessel origin stenosis. Right carotid system: Patent without evidence of stenosis, dissection, or significant atherosclerosis. Retropharyngeal course of the proximal ICA. Left carotid system: Patent with minimal calcified plaque at the carotid bifurcation. No evidence of stenosis or dissection. Retropharyngeal course of the proximal ICA. Vertebral arteries: Patent without evidence of stenosis or dissection. Mildly dominant left vertebral artery. Skeleton: Asymmetrically advanced facet arthrosis on the left at C2-3 and C3-4 with associated grade 1 anterolisthesis. Moderate to severe multilevel cervical disc degeneration. Other neck: No evidence of cervical lymphadenopathy or mass. Upper chest: Respiratory motion artifact without apical lung consolidation or mass. Partially visualized left subclavian approach pacemaker. Review of the MIP images confirms the above findings CTA HEAD FINDINGS Anterior circulation: The internal carotid arteries are patent from skull base to carotid termini with minimal nonstenotic plaque bilaterally. The left M1 segment is widely patent, however there is occlusion of the dominant left M2 inferior division just beyond the bifurcation with mild reconstitution of distal branch  vessels. The right MCA and both ACAs are patent without evidence of a proximal branch occlusion or significant proximal stenosis. No aneurysm is identified. Posterior circulation: The intracranial vertebral arteries are widely patent to the basilar. Patent right PICA, left AICA, and bilateral SCAs are seen. The basilar artery is widely patent. There are diminutive posterior communicating arteries. Both PCAs are patent without evidence of a significant proximal stenosis, however there is a severe distal P2 stenosis on the left. No aneurysm is identified. Venous sinuses: Patent. Anatomic variants: None. Review of the MIP images confirms the above findings IMPRESSION: 1. Proximal left M2 inferior division occlusion. 2. Severe distal left P2 stenosis. 3. Widely patent carotid and vertebral arteries. 4.  Aortic Atherosclerosis (ICD10-I70.0). These results were called by telephone at the time of interpretation on 03/10/2020 at 4:24 pm to Dr. NRuffin Frederick who verbally acknowledged these results. Electronically Signed   By: ALogan BoresM.D.   On: 03/10/2020 16:40   CT ANGIO NECK CODE STROKE  Result Date: 03/10/2020 CLINICAL DATA:  Aphasia. EXAM: CT ANGIOGRAPHY HEAD AND NECK TECHNIQUE: Multidetector CT imaging of the head and neck was performed using the standard protocol during bolus administration of intravenous contrast. Multiplanar CT image reconstructions and MIPs were obtained to evaluate the vascular anatomy. Carotid stenosis measurements (when applicable) are obtained utilizing NASCET criteria, using the distal internal carotid diameter as the denominator. CONTRAST:  620mOMNIPAQUE IOHEXOL 350 MG/ML SOLN COMPARISON:  None. FINDINGS: CTA NECK FINDINGS Aortic arch: Standard 3 vessel aortic arch with mild atherosclerotic plaque. No significant arch vessel origin stenosis. Right carotid system: Patent without evidence of stenosis, dissection, or significant atherosclerosis. Retropharyngeal course of the proximal  ICA. Left carotid system: Patent with minimal calcified plaque at the carotid bifurcation. No evidence of stenosis or dissection. Retropharyngeal course of the proximal ICA. Vertebral arteries: Patent without evidence of stenosis or dissection. Mildly dominant left vertebral artery. Skeleton: Asymmetrically advanced facet arthrosis on the left at C2-3 and C3-4 with associated grade 1 anterolisthesis. Moderate to severe multilevel  cervical disc degeneration. Other neck: No evidence of cervical lymphadenopathy or mass. Upper chest: Respiratory motion artifact without apical lung consolidation or mass. Partially visualized left subclavian approach pacemaker. Review of the MIP images confirms the above findings CTA HEAD FINDINGS Anterior circulation: The internal carotid arteries are patent from skull base to carotid termini with minimal nonstenotic plaque bilaterally. The left M1 segment is widely patent, however there is occlusion of the dominant left M2 inferior division just beyond the bifurcation with mild reconstitution of distal branch vessels. The right MCA and both ACAs are patent without evidence of a proximal branch occlusion or significant proximal stenosis. No aneurysm is identified. Posterior circulation: The intracranial vertebral arteries are widely patent to the basilar. Patent right PICA, left AICA, and bilateral SCAs are seen. The basilar artery is widely patent. There are diminutive posterior communicating arteries. Both PCAs are patent without evidence of a significant proximal stenosis, however there is a severe distal P2 stenosis on the left. No aneurysm is identified. Venous sinuses: Patent. Anatomic variants: None. Review of the MIP images confirms the above findings IMPRESSION: 1. Proximal left M2 inferior division occlusion. 2. Severe distal left P2 stenosis. 3. Widely patent carotid and vertebral arteries. 4.  Aortic Atherosclerosis (ICD10-I70.0). These results were called by telephone at the  time of interpretation on 03/10/2020 at 4:24 pm to Dr. Ruffin Frederick, who verbally acknowledged these results. Electronically Signed   By: Logan Bores M.D.   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 Does the need for close, 24 hr/day medical supervision in concert with the patient's rehab needs make it unreasonable for this patient to be served in a less intensive setting? No 2. Co-Morbidities requiring supervision/potential complications: Breast CA, A fib, COPD, CKD 3. Due to safety, medication administration and patient education, does the patient require 24 hr/day rehab nursing? No 4. Does the patient require coordinated care of a physician, rehab nurse, therapy disciplines of mainly needs OT/SLP to address physical and functional deficits in the context of the above medical diagnosis(es)? No Addressing deficits in the following areas: balance, endurance, locomotion, strength, transferring, dressing, toileting, speech and language 5. Can the patient actively participate in an intensive therapy program of at least 3 hrs of therapy per day at least 5 days per week? Potentially 6. The potential for patient to make measurable gains while on inpatient rehab is too high level candidate per PT/OT notes 7. Anticipated functional outcomes upon discharge from inpatient rehab are n/a  with PT, n/a with OT, n/a with SLP. 8. Estimated rehab length of stay to reach the above functional goals is: n/a 9. Anticipated discharge destination: Home 10. Overall Rehab/Functional Prognosis: n/a  RECOMMENDATIONS: This patient's condition is appropriate for continued rehabilitative care in the following setting: [facility] Therapy and Outpatient Therapy Patient has agreed to participate in recommended program. N/A Note that insurance prior authorization may be required for reimbursement for recommended care.  Comment:  1.  Pt obviously has SLP needs, but doesn't really need PT, which is essential for CIR admission.  2. Also sounds like might need to get back to Cancer treatment ASAP-  3. Pt would benefit from f/u in PM&R clinic [phone]. Either Dr [name], Dr Letta Pate, or Dr Dagoberto Ligas 4. Thank you for this consult.       Lavon Paganini Angiulli, PA-C 03/12/2020    I have personally performed a face to face diagnostic  evaluation of this patient and formulated the key components of the plan.  Additionally, I have personally reviewed laboratory data, imaging studies, as well as relevant notes and concur with the physician assistant's documentation above.

## 2020-03-12 NOTE — Progress Notes (Signed)
STROKE TEAM PROGRESS NOTE   INTERVAL HISTORY Patient is doing fine. No complaints.vitals stable  Vitals:   03/12/20 0500 03/12/20 0600 03/12/20 0700 03/12/20 0800  BP: 123/64 115/66 128/76   Pulse: 80 86 80   Resp: 18 17 15    Temp:    98.6 F (37 C)  TempSrc:    Oral  SpO2: 96% 95% 95%   Height:        CBC:  Recent Labs  Lab 03/10/20 1509  WBC 10.2  NEUTROABS 9.0*  HGB 13.9  HCT 41.8  MCV 89.7  PLT 725    Basic Metabolic Panel:  Recent Labs  Lab 03/10/20 1509  NA 137  K 4.4  CL 98  CO2 26  GLUCOSE 116*  BUN 23  CREATININE 1.17*  CALCIUM 9.7   Lipid Panel:     Component Value Date/Time   CHOL 202 (H) 03/11/2020 0552   CHOL 276 (H) 08/20/2013 0335   TRIG 97 03/11/2020 0552   TRIG 121 08/20/2013 0335   HDL 54 03/11/2020 0552   HDL 81 (H) 08/20/2013 0335   CHOLHDL 3.7 03/11/2020 0552   VLDL 19 03/11/2020 0552   VLDL 24 08/20/2013 0335   LDLCALC 129 (H) 03/11/2020 0552   LDLCALC 171 (H) 08/20/2013 0335   HgbA1c:  Lab Results  Component Value Date   HGBA1C 5.7 (H) 03/11/2020   Urine Drug Screen: No results found for: LABOPIA, COCAINSCRNUR, LABBENZ, AMPHETMU, THCU, LABBARB  Alcohol Level     Component Value Date/Time   ETH <10 08/04/2017 1716    IMAGING past 24 hours CT HEAD WO CONTRAST  Result Date: 03/11/2020 CLINICAL DATA:  Stroke post thrombectomy, follow-up EXAM: CT HEAD WITHOUT CONTRAST TECHNIQUE: Contiguous axial images were obtained from the base of the skull through the vertex without intravenous contrast. COMPARISON:  03/10/2020 FINDINGS: Brain: Hypoattenuation and loss of gray-white differentiation is present in the left parietotemporal lobes and posterior insula. This is more apparent than on the prior study keeping with expected evolution. There is some ill-defined areas hyperdensity, which may reflect petechial hemorrhage. There is no discrete hematoma. No significant mass effect. Chronic left occipital infarct. Small chronic right  cerebellar infarct. Stable findings of chronic microvascular ischemic changes. Vascular: There remains hyperdensity along left MCA branches within sylvian fissure. Skull: No acute abnormality. Sinuses/Orbits: No acute abnormality. Other: None. IMPRESSION: Evolving acute left MCA territory infarct. Some ill-defined areas of hyperdensity may reflect mild petechial hemorrhage. No discrete hematoma. No significant mass effect. Electronically Signed   By: Macy Mis M.D.   On: 03/11/2020 17:00   ECHOCARDIOGRAM COMPLETE  Result Date: 03/11/2020    ECHOCARDIOGRAM REPORT   Patient Name:   Adriana Martin Date of Exam: 03/11/2020 Medical Rec #:  [id number]        Height:       62.0 in Accession #:    [id number]       Weight:       182.8 lb Date of Birth:  06/17/1946        BSA:          1.840 m Patient Age:    74 years         BP:           97/77 mmHg Patient Gender: F                HR:           84 bpm. Exam Location:  Inpatient Procedure: 2D Echo  Indications:    Stroke I163.9  History:        Patient has prior history of Echocardiogram examinations, most                 recent 06/07/2016. CHF, COPD, Arrythmias:Atrial Fibrillation;                 Risk Factors:Hypertension and Dyslipidemia.                  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  Sonographer:    Mikki Santee RDCS (AE) Referring Phys: [id number] ASHISH ARORA IMPRESSIONS  1. Akinesis of the distal inferior wall with overall normal LV function; s/p bioprosthetic MVR with mean gradient 10 mmHg and trace MR; mild LAE.  2. Left ventricular ejection fraction, by estimation, is 55 to 60%. The left ventricle has normal function. The left ventricle demonstrates regional wall motion abnormalities (see scoring diagram/findings for description). Left ventricular diastolic parameters are indeterminate.  3. Right ventricular systolic function is normal. The right ventricular size is normal. Tricuspid  regurgitation signal is inadequate for assessing PA pressure.  4. The mitral valve is normal in structure. Trivial mitral valve regurgitation. No evidence of mitral stenosis. There is a bioprosthetic valve present in the mitral position.  5. The aortic valve is tricuspid. Aortic valve regurgitation is not visualized. No aortic stenosis is present.  6. The inferior vena cava is normal in size with greater than 50% respiratory variability, suggesting right atrial pressure of 3 mmHg. FINDINGS  Left Ventricle: Left ventricular ejection fraction, by estimation, is 55 to 60%. The left ventricle has normal function. The left ventricle demonstrates regional wall motion abnormalities. The left ventricular internal cavity size was normal in size. There is no left ventricular hypertrophy. Left ventricular diastolic parameters are indeterminate. Right Ventricle: The right ventricular size is normal. Right ventricular systolic function is normal. Tricuspid regurgitation signal is inadequate for assessing PA pressure. The tricuspid regurgitant velocity is 1.79 m/s, and with an assumed right atrial  pressure of 3 mmHg, the estimated right ventricular systolic pressure is 36.1 mmHg. Left Atrium: Left atrial size was normal in size. Right Atrium: Right atrial size was normal in size. Pericardium: There is no evidence of pericardial effusion. Mitral Valve: The mitral valve is normal in structure. Normal mobility of the mitral valve leaflets. Moderate mitral annular calcification. Trivial mitral valve regurgitation. There is a bioprosthetic valve present in the mitral position. No evidence of mitral valve stenosis. MV peak gradient, 20.1 mmHg. The mean mitral valve gradient is 10.0 mmHg. Tricuspid Valve: The tricuspid valve is normal in structure. Tricuspid valve regurgitation is trivial. No evidence of tricuspid stenosis. Aortic Valve: The aortic valve is tricuspid. Aortic valve regurgitation is not visualized. No aortic stenosis is  present. Aortic valve mean gradient measures 7.0 mmHg. Aortic valve peak gradient measures 15.8 mmHg. Aortic valve area, by VTI measures 1.86  cm. Pulmonic Valve: The pulmonic valve was normal in structure. Pulmonic valve regurgitation is not visualized. No evidence of pulmonic stenosis. Aorta: The aortic root is normal in size and structure. Venous: The inferior vena cava is normal in size with greater than 50% respiratory variability, suggesting right atrial pressure of 3 mmHg. IAS/Shunts: No atrial level shunt detected by color flow Doppler. Additional Comments: Akinesis of the distal inferior wall with overall normal LV function; s/p bioprosthetic MVR with mean  gradient 10 mmHg and trace MR; mild LAE. A pacer wire is visualized.  LEFT VENTRICLE PLAX 2D LVIDd:         4.15 cm LVIDs:         3.20 cm LV PW:         1.10 cm LV IVS:        0.95 cm LVOT diam:     2.00 cm LV SV:         63 LV SV Index:   34 LVOT Area:     3.14 cm  LEFT ATRIUM             Index       RIGHT ATRIUM           Index LA diam:        4.60 cm 2.50 cm/m  RA Area:     15.40 cm LA Vol (A2C):   53.7 ml 29.19 ml/m RA Volume:   32.40 ml  17.61 ml/m LA Vol (A4C):   63.6 ml 34.57 ml/m LA Biplane Vol: 58.2 ml 31.63 ml/m  AORTIC VALVE AV Area (Vmax):    2.02 cm AV Area (Vmean):   1.94 cm AV Area (VTI):     1.86 cm AV Vmax:           199.00 cm/s AV Vmean:          120.000 cm/s AV VTI:            0.337 m AV Peak Grad:      15.8 mmHg AV Mean Grad:      7.0 mmHg LVOT Vmax:         128.00 cm/s LVOT Vmean:        74.200 cm/s LVOT VTI:          0.200 m LVOT/AV VTI ratio: 0.59  AORTA Ao Root diam: 3.10 cm MITRAL VALVE              TRICUSPID VALVE MV Area (PHT): 1.91 cm   TR Peak grad:   12.8 mmHg MV Peak grad:  20.1 mmHg  TR Vmax:        179.00 cm/s MV Mean grad:  10.0 mmHg MV Vmax:       2.24 m/s   SHUNTS MV Vmean:      151.5 cm/s Systemic VTI:  0.20 m                           Systemic Diam: 2.00 cm Kirk Ruths MD Electronically signed by Kirk Ruths MD Signature Date/Time: 03/11/2020/4:12:16 PM    Final     PHYSICAL EXAM   Pleasant elderly Caucasian lady not in distress. . Afebrile. Head is nontraumatic. Neck is supple without bruit.    Cardiac exam no murmur or gallop. Lungs are clear to auscultation. Distal pulses are well felt. Neurological Exam :  She is awake alert oriented to time place and person.  Extraocular movements are full range without nystagmus.  Right homonymous hemianopsia.  Speech is clear without dysarthria or aphasia.  Right lower facial asymmetry.  Tongue midline.  Motor system exam shows no upper or lower extremity drift but mild weakness of right grip and intrinsic hand muscles.  Orbits left over right upper extremity.  Mild weakness of right hip flexors and ankle dorsiflexors.  Sensation appears preserved bilaterally.  Gait not tested.  ASSESSMENT/PLAN Ms. ASHLINN HEMRICK is a 74 y.o. female with history of  recurrent breast cancer undercoing XRT, primary biliary cirrhosis, MGUS, AF on aspirin (not on AC d/t fall risk), limited ROM R shoulder d/t arthropathy, HTN, HLD, CKD, COPD presenting with R sided weakness and incontinence. Received tPA 03/10/2020 at 1547 per MAR. Found to have hyperdense L MCA and sent to IR.  Stroke:   L MCA infarct s/p partial revascularization L M2. Infarct embolic secondary to known AF not on [facility]  Code Stroke CT head acute L MCA infarct w/ hyperdense L MCA. ASPECTS 7    CTA head & neck proximal L M2 inferior division occlusion. Severe distal L P2 stenosis. Aortic atherosclerosis.   Cerebral angio proximal occlusion L M2 branch. Completed revascularization w/ slow flow distally TICI2c.   Post IR CT no hemorrhage    24h CT L MCA infarct. Petechial hemorrhage, mild.  2D Echo EF 55-60%. No source of embolus. Bioprosthetic MV  LDL 129  HgbA1c 5.7  SCDs for VTE prophylaxis. Add lovenox today  aspirin 81 mg daily prior to admission, now on aspirin 81 mg daily for now. Plan Eliquis  at d/c   Therapy recommendations:  CIR   Disposition:  pending   Atrial Fibrillation  Home anticoagulation:  none d/t fall risk   eliquis at d/c   Hypertension  Home meds:  Lisinopril 5, diltiazem CD 180, lasix 20  Resumed home meds x lasix (and K)  Stable . BP goal <180  . Long-term BP goal normotensive  Hyperlipidemia  Home meds:  zetia 10 and pravachol 20, resumed in hospital  LDL 129, goal < 70  Continue statin at discharge  Dysphagia, resolved . Secondary to stroke . Cleared for reg diet, thin liquids . Speech on board   Other Stroke Risk Factors  Advanced age  Former Cigarette smoker, quit 21 yrs ago  Obesity, Body mass index is 33.43 kg/m., recommend weight loss, diet and exercise as appropriate   Hx Congestive heart failure  Pacer  Other Active Problems  breast cancer, currently off tamoxifen d/t chemo  CKD stage 3    COPD on inhaler   Fe deficiency anemia  Depression on effexor   On prednisone   Continue IVF x 48h given CTA  Hospital day # 2  Transfer to floor bed. Continue therapies. Transfer to inpt rehab in next few days.Greater than 50 % time during this 25 minute visit was spent on counselling and coordination of care about her stroke and answering questions.      Antony Contras, MD  To contact Stroke Continuity provider, please refer to [email or web address] After hours, contact General Neurology

## 2020-03-12 NOTE — Plan of Care (Signed)
  Problem: Nutrition: Goal: Risk of aspiration will decrease Outcome: Adequate for Discharge

## 2020-03-13 MED ORDER — PANTOPRAZOLE SODIUM 40 MG PO TBEC
40.0000 mg | DELAYED_RELEASE_TABLET | Freq: Every day | ORAL | Status: DC
  Administered 2020-03-13 – 2020-03-14 (×2): 40 mg via ORAL
  Filled 2020-03-13 (×2): qty 1

## 2020-03-13 NOTE — TOC Initial Note (Signed)
Transition of Care [facility]) - Initial/Assessment Note    Patient Details  Name: Adriana Martin MRN: [id number] Date of Birth: 1946/08/02  Transition of [facility]) CM/SW Contact:    Adriana Friar, RN Phone Number: 03/13/2020, 1:20 PM  Clinical Narrative:                 Recommendations are for SNF. CM met with the patient and one of her daughters but the daughter not present is [name]. CM called [name]: Adriana Martin and went over the recommendations from therapy. She is in agreement with having her mother faxed out for rehab. She asked for the [place] area.  CM will initiate insurance and provide the daughter bed offers as they become available.  TOC following.  Expected Discharge Plan: Skilled Nursing Facility Barriers to Discharge: Continued Medical Work up   Patient Goals and CMS Choice   CMS Medicare.gov Compare Post Acute Care list provided to:: Patient Represenative (must comment) Choice offered to / list presented to : Patient, Adult Children  Expected Discharge Plan and Services Expected Discharge Plan: [place] In-house Referral: Clinical Social Work Discharge Planning Services: CM Consult Post Acute Care Choice: [place] Living arrangements for the past 2 months: [place]                                      Prior Living Arrangements/Services Living arrangements for the past 2 months: Single Family Home Lives with:: Adult Children Patient language and need for interpreter reviewed:: Yes Do you feel safe going back to the place where you live?: Yes      Need for Family Participation in Patient Care: Yes (Comment) Care giver support system in place?: No (comment) (daughter works during the day)   Criminal Activity/Legal Involvement Pertinent to Current Situation/Hospitalization: No - Comment as needed  Activities of Daily Living Home Assistive Devices/Equipment: Cane (specify quad or straight), Built-in shower seat,  Dentures (specify type), Eyeglasses ADL Screening (condition at time of admission) Patient's cognitive ability adequate to safely complete daily activities?: Yes Is the patient deaf or have difficulty hearing?: No Does the patient have difficulty seeing, even when wearing glasses/contacts?: Yes Does the patient have difficulty concentrating, remembering, or making decisions?: No Patient able to express need for assistance with ADLs?: Yes Does the patient have difficulty dressing or bathing?: Yes Independently performs ADLs?: No Communication: Independent Dressing (OT): Needs assistance Is this a change from baseline?: Pre-admission baseline Grooming: Needs assistance Is this a change from baseline?: Pre-admission baseline Feeding: Independent Bathing: Needs assistance Is this a change from baseline?: Pre-admission baseline Toileting: Independent In/Out Bed: Independent Walks in Home: Independent Does the patient have difficulty walking or climbing stairs?: Yes Weakness of Legs: Both Weakness of Arms/Hands: Right  Permission Sought/Granted                  Emotional Assessment Appearance:: Appears stated age Attitude/Demeanor/Rapport: Engaged Affect (typically observed): Accepting Orientation: : Oriented to Self, Oriented to Place, Oriented to  Time   Psych Involvement: No (comment)  Admission diagnosis:  Acute ischemic stroke [facility]) [I63.9] Patient Active Problem List   Diagnosis Date Noted  . Acute ischemic stroke ([place]) 03/10/2020  . Osteopenia 01/21/2020  . Family history of breast cancer   . Family history of multiple myeloma   . Family history of esophageal cancer   . 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/2018  .  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 Carcinoma of overlapping sites of right breast in female, estrogen receptor positive ([place]) 06/16/2016  . Acute exacerbation of CHF (congestive heart failure) ([place]) 06/07/2016  . A-fib  ([place]) 04/08/2016  . COPD exacerbation ([place]) 03/22/2016  . Acute bronchitis 03/22/2016  . Atrial fibrillation with RVR ([place]) 03/22/2016  . Chronic renal insufficiency 03/22/2016  . Cough with expectoration 06/04/2015  . Thrombocytosis ([place]) 02/01/2015  . Anemia 02/01/2015  . Atrial flutter ([place]) 01/23/2015  . Atrial fibrillation ([place]) 01/23/2015  . Other long term (current) drug therapy 11/06/2014  . Chronic diastolic heart failure ([place]) 08/01/2014  . Long term current use of anticoagulant 06/06/2014  . Anxiety 05/13/2014  . Chronic obstructive pulmonary disease ([place]) 03/22/2014  . 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 Avitaminosis D 02/19/2014   PCP:  [name], MD Pharmacy:   [facility], [place] [address] [place] [address] Phone: [phone] Fax: [phone]     Social Determinants of Health (SDOH) Interventions    Readmission Risk Interventions No flowsheet data found.

## 2020-03-13 NOTE — NC FL2 (Addendum)
[place] LEVEL OF CARE SCREENING TOOL     IDENTIFICATION  [name] Birthdate: 01-02-1946 Sex: female Admission Date (Current Location): 03/10/2020  [facility] and [place] Number:  Engineering geologist and Address:  [facility], [place] [address], [place], [place] [address]      Provider Number: [id number]  Attending Physician Name and Address:  Garvin Fila, MD  Relative Name and Phone Number:       Current Level of Care: Hospital Recommended Level of Care: [place] Prior Approval Number:    Date Approved/Denied:   PASRR Number: [id number]  Discharge Plan: SNF    Current Diagnoses: Patient Active Problem List   Diagnosis Date Noted   Acute ischemic stroke ([place]) 03/10/2020   Osteopenia 01/21/2020   Family history of breast cancer    Family history of multiple myeloma    Family history of esophageal cancer    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/2018   MGUS (monoclonal gammopathy of unknown significance) 12/14/2017   Carcinoma of overlapping sites of right breast in female, estrogen receptor positive ([place]) 06/16/2016   Acute exacerbation of CHF (congestive heart failure) ([place]) 06/07/2016   A-fib ([place]) 04/08/2016   COPD exacerbation ([place]) 03/22/2016   Acute bronchitis 03/22/2016   Atrial fibrillation with RVR ([place]) 03/22/2016   Chronic renal insufficiency 03/22/2016   Cough with expectoration 06/04/2015   Thrombocytosis ([place]) 02/01/2015   Anemia 02/01/2015   Atrial flutter ([place]) 01/23/2015   Atrial fibrillation ([place]) 01/23/2015   Other long term (current) drug therapy 11/06/2014   Chronic diastolic heart failure ([place]) 08/01/2014   Long term current use of anticoagulant 06/06/2014   Anxiety 05/13/2014   Chronic obstructive pulmonary disease ([place]) 03/22/2014   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   Avitaminosis D 02/19/2014    Orientation RESPIRATION BLADDER Height & Weight      Self, Time, Place  O2 (see d/c summary for oxygen needs) Continent Weight:   Height:  5' 2"  (157.5 cm)  BEHAVIORAL SYMPTOMS/MOOD NEUROLOGICAL BOWEL NUTRITION STATUS      Continent Diet (Heart healthy with thin liquids)  AMBULATORY STATUS COMMUNICATION OF NEEDS Skin   Limited Assist Verbally Bruising, Skin abrasions (Bruising and skin tear to bilat elbows)                       Personal Care Assistance Level of Assistance  Bathing, Feeding, Dressing Bathing Assistance: Limited assistance Feeding assistance: Independent Dressing Assistance: Limited assistance     Functional Limitations Info  Sight, Hearing, Speech Sight Info: Impaired Hearing Info: Impaired Speech Info: Impaired (Aphasia)    SPECIAL CARE FACTORS FREQUENCY  PT (By licensed PT), OT (By licensed OT), Speech therapy     PT Frequency: 5x/wk OT Frequency: 5x/wk     Speech Therapy Frequency: 5x/wk      Contractures Contractures Info: Not present    Additional Factors Info  Code Status, Allergies, Psychotropic Code Status Info: Full Allergies Info: atorvastatin, calcium, calcium carbonate, fluoxetine, hydrochlorothiazide Psychotropic Info: Effexor XR 37.50m Daily         Current Medications (03/13/2020):  This is the current hospital active medication list Current Facility-Administered Medications  Medication Dose Route Frequency Provider Last Rate Last Admin   0.9 %  sodium chloride infusion   Intravenous Continuous AAmie [place] MD   Stopped at 03/11/20 2101   acetaminophen (TYLENOL) tablet 650 mg  650 mg Oral Q4H PRN AAmie [place] MD  Or   acetaminophen (TYLENOL) 160 MG/5ML solution 650 mg  650 mg Per Tube Q4H PRN Amie [place], MD       Or   acetaminophen (TYLENOL) suppository 650 mg  650 mg Rectal Q4H PRN Amie [place], MD       aspirin EC tablet 81 mg  81 mg Oral Daily [place] Starch, NP   81 mg at 03/13/20 1039   clevidipine (CLEVIPREX) infusion 0.5 mg/mL  0-21 mg/hr Intravenous  Continuous de Sindy Messing, Erven Colla, MD   Stopped at 03/11/20 0401   diltiazem (CARDIZEM CD) 24 hr capsule 180 mg  180 mg Oral Daily [place] Sabin L, NP   180 mg at 03/13/20 1040   enoxaparin (LOVENOX) injection 40 mg  40 mg Subcutaneous Q24H Burnetta Sabin L, NP   40 mg at 03/13/20 1043   ezetimibe (ZETIA) tablet 10 mg  10 mg Oral Daily Burnetta Sabin L, NP   10 mg at 03/13/20 1041   ferrous sulfate tablet 325 mg  325 mg Oral Q breakfast Donzetta Starch, NP   325 mg at 03/13/20 1041   levothyroxine (SYNTHROID) tablet 50 mcg  50 mcg Oral QAC breakfast Donzetta Starch, NP   50 mcg at 03/13/20 1537   lisinopril (ZESTRIL) tablet 5 mg  5 mg Oral Daily Burnetta Sabin L, NP   5 mg at 03/13/20 1040   mometasone-formoterol (DULERA) 100-5 MCG/ACT inhaler 2 puff  2 puff Inhalation BID Donzetta Starch, NP   2 puff at 03/13/20 0853   pantoprazole (PROTONIX) EC tablet 40 mg  40 mg Oral QHS Pierce, Dwayne A, RPH       pravastatin (PRAVACHOL) tablet 20 mg  20 mg Oral QHS Burn[place] Sabin L, NP   20 mg at 03/12/20 2229   predniSONE (DELTASONE) tablet 5 mg  5 mg Oral Q breakfast Burnetta Sabin L, NP   5 mg at 03/13/20 1040   senna-docusate (Senokot-S) tablet 1 tablet  1 tablet Oral QHS PRN Amie [place], MD       ursodiol (ACTIGALL) capsule 300 mg  300 mg Oral BID Burn[place] Sabin L, NP   300 mg at 03/13/20 1041   venlafaxine XR (EFFEXOR-XR) 24 hr capsule 37.5 mg  37.5 mg Oral Daily Burn[place] Sabin L, NP   37.5 mg at 03/13/20 1041     Discharge Medications: Please see discharge summary for a list of discharge medications.  Relevant Imaging Results:  Relevant Lab Results:   Additional Information SS#: [id number]  Pollie Friar, RN  I have personally obtained history,examined this patient, reviewed notes, independently viewed imaging studies, participated in medical decision making and plan of care.ROS completed by me personally and pertinent positives fully documented  I have made any additions or clarifications directly  to the above note. Agree with note above.   Antony Contras, MD Medical Director [facility] Stroke Center Pager: [phone] 03/13/2020 5:10 PM

## 2020-03-13 NOTE — Progress Notes (Signed)
Physical Therapy Treatment Patient Details Name: Adriana Martin MRN: [id number] DOB: January 14, 1946 Today's Date: 03/13/2020    History of Present Illness This 74 y.o. female transferred from [facility]  with Rt UE weakness, acute onset aphasia. TpA administered,  CTA of head showed M2 occlusion.  She underwent mechanical thrombectomy.  PMH includes: thrombocytopenia, SOB, permanent cardiac pacemaker, h/o radiation therapy due to breast CA, COPD, A-Fib, CHF, s/p Rt reverse shoulder arthroplasty, s/p MVR, s/p Rt mastectomy with axillary lymph node dissection    PT Comments    Patient seen for mobility progression. Pt is making progress toward PT goals and requires supervision-min guard assist for functional transfer/gait training with single UE support. Pt presents with impaired balance,  Language, and cognition that impairs her ability to care for herself safely at home without 24 hour supervision/assistance. Recommend SNF for further skilled PT services to maximize independence and safety with mobility prior to d/c home with intermittent supervision.     Follow Up Recommendations  SNF     Equipment Recommendations  None recommended by PT    Recommendations for Other Services       Precautions / Restrictions Precautions Precautions: Fall Restrictions Weight Bearing Restrictions: No    Mobility  Bed Mobility Overal bed mobility: Modified Independent Bed Mobility: Supine to Sit           General bed mobility comments: HOB elevated   Transfers Overall transfer level: Needs assistance Equipment used: None Transfers: Sit to/from Stand Sit to Stand: Supervision         General transfer comment: supervision for safety  Ambulation/Gait Ambulation/Gait assistance: Min guard Gait Distance (Feet): 150 Feet Assistive device: Straight cane Gait Pattern/deviations: Step-through pattern;Decreased stride length Gait velocity: decreased   General Gait Details: min guard for safety  with single UE support increased instability with horizontal head turns but no LOB   Stairs             Wheelchair Mobility    Modified Rankin (Stroke Patients Only) Modified Rankin (Stroke Patients Only) Pre-Morbid Rankin Score: Slight disability Modified Rankin: Moderately severe disability     Balance Overall balance assessment: Needs assistance Sitting-balance support: Feet supported Sitting balance-Leahy Scale: Good     Standing balance support: No upper extremity supported;Single extremity supported;During functional activity Standing balance-Leahy Scale: Fair Standing balance comment: able to static stand without UE support                             Cognition Arousal/Alertness: Awake/alert Behavior During Therapy: Flat affect Overall Cognitive Status: Difficult to assess                                 General Comments: expressive and perceptive aphasia; multimodal cues needed at times for single step commands; does not follow multi step commands; pt unable to problem solve with cues what to do in the event of an emergency      Exercises      General Comments        Pertinent Vitals/Pain Pain Assessment: No/denies pain    Home Living                      Prior Function            PT Goals (current goals can now be found in the care plan section) Progress towards PT goals:  Progressing toward goals    Frequency    Min 3X/week      PT Plan Discharge plan needs to be updated;Frequency needs to be updated    Co-evaluation              AM-PAC PT "6 Clicks" Mobility   Outcome Measure  Help needed turning from your back to your side while in a flat bed without using bedrails?: None Help needed moving from lying on your back to sitting on the side of a flat bed without using bedrails?: None Help needed moving to and from a bed to a chair (including a wheelchair)?: A Little Help needed standing up from  a chair using your arms (e.g., wheelchair or bedside chair)?: None Help needed to walk in hospital room?: A Little Help needed climbing 3-5 steps with a railing? : A Little 6 Click Score: 21    End of Session Equipment Utilized During Treatment: Gait belt Activity Tolerance: Patient tolerated treatment well Patient left: in chair;with call bell/phone within reach;with chair alarm set;with family/visitor present Nurse Communication: Mobility status PT Visit Diagnosis: Other abnormalities of gait and mobility (R26.89);Other symptoms and signs involving the nervous system (O96.295)     Time: 2841-3244 PT Time Calculation (min) (ACUTE ONLY): 31 min  Charges:  $Gait Training: 23-37 mins                     Earney Navy, PTA Acute Rehabilitation Services Pager: [phone] Office: [phone]     Darliss Cheney 03/13/2020, 12:49 PM

## 2020-03-13 NOTE — Progress Notes (Signed)
STROKE TEAM PROGRESS NOTE   INTERVAL HISTORY Patient is neurologically stable.  She still has right-sided homonymous hemianopsia but otherwise no other focal deficits.  She is being considered for inpatient rehab but may not qualify and family may not be able to provide 24-hour care at home and may need to consider skilled nursing facility.  Vital signs are stable.  Vitals:   03/13/20 0833 03/13/20 0853 03/13/20 1426 03/13/20 1600  BP:   117/63 120/68  Pulse: 79  80 78  Resp: 20  20 18   Temp: 98 F (36.7 C)  97.6 F (36.4 C) 97.6 F (36.4 C)  TempSrc: Oral  Oral Oral  SpO2: 98% 99% 97% 97%  Height:        CBC:  Recent Labs  Lab 03/10/20 1509  WBC 10.2  NEUTROABS 9.0*  HGB 13.9  HCT 41.8  MCV 89.7  PLT 623    Basic Metabolic Panel:  Recent Labs  Lab 03/10/20 1509  NA 137  K 4.4  CL 98  CO2 26  GLUCOSE 116*  BUN 23  CREATININE 1.17*  CALCIUM 9.7   Lipid Panel:     Component Value Date/Time   CHOL 202 (H) 03/11/2020 0552   CHOL 276 (H) 08/20/2013 0335   TRIG 97 03/11/2020 0552   TRIG 121 08/20/2013 0335   HDL 54 03/11/2020 0552   HDL 81 (H) 08/20/2013 0335   CHOLHDL 3.7 03/11/2020 0552   VLDL 19 03/11/2020 0552   VLDL 24 08/20/2013 0335   LDLCALC 129 (H) 03/11/2020 0552   LDLCALC 171 (H) 08/20/2013 0335   HgbA1c:  Lab Results  Component Value Date   HGBA1C 5.7 (H) 03/11/2020   Urine Drug Screen: No results found for: LABOPIA, COCAINSCRNUR, LABBENZ, AMPHETMU, THCU, LABBARB  Alcohol Level     Component Value Date/Time   ETH <10 08/04/2017 1716    IMAGING past 24 hours No results found.  PHYSICAL EXAM  Pleasant elderly Caucasian lady not in distress. . Afebrile. Head is nontraumatic. Neck is supple without bruit.    Cardiac exam no murmur or gallop. Lungs are clear to auscultation. Distal pulses are well felt. Neurological Exam :  She is awake alert oriented to time place and person.  Extraocular movements are full range without nystagmus.  Right  homonymous hemianopsia.  Speech is clear without dysarthria or aphasia.  Right lower facial asymmetry.  Tongue midline.  Motor system exam shows no upper or lower extremity drift but mild weakness of right grip and intrinsic hand muscles.  Orbits left over right upper extremity.  Mild weakness of right hip flexors and ankle dorsiflexors.  Sensation appears preserved bilaterally.  Gait not tested.  ASSESSMENT/PLAN [name] is a 74 y.o. female with history of recurrent breast cancer undercoing XRT, primary biliary cirrhosis, MGUS, AF on aspirin (not on AC d/t fall risk), limited ROM R shoulder d/t arthropathy, HTN, HLD, CKD, COPD presenting with R sided weakness and incontinence. Received tPA 03/10/2020 at 1547 per MAR. Found to have hyperdense L MCA and sent to IR.  Stroke:   L MCA infarct s/p partial revascularization L M2. Infarct embolic secondary to known AF not on [facility]  Code Stroke CT head acute L MCA infarct w/ hyperdense L MCA. ASPECTS 7    CTA head & neck proximal L M2 inferior division occlusion. Severe distal L P2 stenosis. Aortic atherosclerosis.   Cerebral angio proximal occlusion L M2 branch. Completed revascularization w/ slow flow distally TICI2c.  Post IR CT no hemorrhage    24h CT L MCA infarct. Petechial hemorrhage, mild.  2D Echo EF 55-60%. No source of embolus. Bioprosthetic MV  LDL 129  HgbA1c 5.7  VTE prophylaxis - lovenox   aspirin 81 mg daily prior to admission, now on aspirin 81 mg daily for now. Plan Eliquis at d/c   Therapy recommendations:  CIR->SNF . TOC team involved in securing place.  Disposition:  pending   COVID testing pending for planned SNF d/c  Atrial Fibrillation  Home anticoagulation:  none d/t fall risk   eliquis at d/c   Hypertension  Home meds:  Lisinopril 5, diltiazem CD 180, lasix 20  Resumed home meds x lasix (and K)  Stable . BP goal <180  . Long-term BP goal normotensive  Hyperlipidemia  Home meds:  zetia 10  and pravachol 20, resumed in hospital  LDL 129, goal < 70  Continue statin at discharge  Dysphagia, resolved . Secondary to stroke . Cleared for reg diet, thin liquids . Speech on board   Other Stroke Risk Factors  Advanced age  Former Cigarette smoker, quit 21 yrs ago  Obesity, Body mass index is 33.43 kg/m., recommend weight loss, diet and exercise as appropriate   Hx Congestive heart failure  Pacer  Other Active Problems  breast cancer, currently off tamoxifen d/t chemo  CKD stage 3    COPD on inhaler   Fe deficiency anemia  Depression on effexor   On prednisone   Continue IVF x 48h given CTA  Hospital day # 3  Continue ongoing therapies and decision on disposition to rehab versus skilled nursing facility being considered.   Antony Contras, MD  To contact Stroke Continuity provider, please refer to [email or web address] After hours, contact General Neurology

## 2020-03-13 NOTE — Progress Notes (Signed)
Inpatient Rehabilitation Admissions Coordinator  I met at bedside with daughter, [name] and patient . I discussed Dr. Florentina Jenny recommendations that she is not in need of an inpt rehab admit at this level. [name] asks that SW follow up with her sister, [name], who manages Mom's follow up. Patient does not have 24/7 supervision at home. I will alert TOC team and we will sign off at this time.  Danne Baxter, RN, MSN Rehab Admissions Coordinator [phone] 03/13/2020 11:15 AM

## 2020-03-13 NOTE — Plan of Care (Signed)
  Problem: Education: Goal: Knowledge of General Education information will improve Description Including pain rating scale, medication(s)/side effects and non-pharmacologic comfort measures Outcome: Progressing   

## 2020-03-13 NOTE — Progress Notes (Signed)
PHARMACIST - PHYSICIAN COMMUNICATION  DR:   Leonie Man  CONCERNING: IV to Oral Route Change Policy  RECOMMENDATION: This patient is receiving pantoprazole by the intravenous route.  Based on criteria approved by the Pharmacy and Therapeutics Committee, the intravenous medication(s) is/are being converted to the equivalent oral dose form(s).   DESCRIPTION: These criteria include:  The patient is eating (either orally or via tube) and/or has been taking other orally administered medications for a least 24 hours  The patient has no evidence of active gastrointestinal bleeding or impaired GI absorption (gastrectomy, short bowel, patient on TNA or NPO).  If you have questions about this conversion, please contact the Pharmacy Department  []   [phone] )  [facility] []   [phone] )  [facility] [x]   [phone] )  [facility] []   [phone] )  [facility] []   [phone] )  [place]. Levada Dy, PharmD, BCPS, FNKF Clinical Pharmacist [place] Please utilize Amion for appropriate phone number to reach the unit pharmacist ([place])   03/13/2020 9:28 AM

## 2020-03-14 ENCOUNTER — Encounter (HOSPITAL_COMMUNITY): Payer: Self-pay | Admitting: Student in an Organized Health Care Education/Training Program

## 2020-03-14 DIAGNOSIS — E785 Hyperlipidemia, unspecified: Secondary | ICD-10-CM | POA: Diagnosis present

## 2020-03-14 DIAGNOSIS — I10 Essential (primary) hypertension: Secondary | ICD-10-CM

## 2020-03-14 MED ORDER — FUROSEMIDE 20 MG PO TABS
20.0000 mg | ORAL_TABLET | Freq: Every day | ORAL | Status: DC
  Administered 2020-03-14 – 2020-03-15 (×2): 20 mg via ORAL
  Filled 2020-03-14 (×2): qty 1

## 2020-03-14 MED ORDER — POTASSIUM CHLORIDE CRYS ER 20 MEQ PO TBCR
20.0000 meq | EXTENDED_RELEASE_TABLET | Freq: Two times a day (BID) | ORAL | Status: DC
  Administered 2020-03-14 – 2020-03-15 (×3): 20 meq via ORAL
  Filled 2020-03-14 (×3): qty 1

## 2020-03-14 NOTE — Progress Notes (Signed)
Physical Therapy Treatment Patient Details Name: Adriana Martin MRN: [id number] DOB: 1945/10/03 Today's Date: 03/14/2020    History of Present Illness This 74 y.o. female transferred from [facility]  with Rt UE weakness, acute onset aphasia. TpA administered,  CTA of head showed M2 occlusion.  She underwent mechanical thrombectomy.  PMH includes: thrombocytopenia, SOB, permanent cardiac pacemaker, h/o radiation therapy due to breast CA, COPD, A-Fib, CHF, s/p Rt reverse shoulder arthroplasty, s/p MVR, s/p Rt mastectomy with axillary lymph node dissection    PT Comments    Pt progressing towards physical therapy goals. She was able to ambulate in the hall without an AD this session, however noted pt fatigued quickly and required occasional assist due to unsteadiness. Pt indicates she would like to progress away from the walker. Aphasia limiting communication at times. Pt was visibly frustrated when she would say the wrong word or couldn't think of the word she wanted to say, however did seem to engage more and appear happier when talking about her dog. Will continue to follow.    Follow Up Recommendations  SNF     Equipment Recommendations  None recommended by PT    Recommendations for Other Services Rehab consult     Precautions / Restrictions Precautions Precautions: Fall Restrictions Weight Bearing Restrictions: No    Mobility  Bed Mobility Overal bed mobility: Modified Independent Bed Mobility: Supine to Sit     Supine to sit: Supervision     General bed mobility comments: No assist required to transition to/from EOB. At end of session, pt was able to scoot herself up towards [facility] without difficulty.   Transfers Overall transfer level: Needs assistance Equipment used: None;Rolling walker (2 wheeled) Transfers: Sit to/from Stand Sit to Stand: Supervision         General transfer comment: supervision for safety  Ambulation/Gait Ambulation/Gait assistance: Min guard;Min  assist Gait Distance (Feet): 150 Feet Assistive device: None Gait Pattern/deviations: Step-through pattern;Decreased stride length Gait velocity: decreased Gait velocity interpretation: <1.8 ft/sec, indicate of risk for recurrent falls General Gait Details: Pt wanted to attempt without the RW or SPC. She fatigued quickly and was mildly dyspneic upon return to room. Occasional unsteadiness with min assist required to recover.    Stairs             Wheelchair Mobility    Modified Rankin (Stroke Patients Only) Modified Rankin (Stroke Patients Only) Pre-Morbid Rankin Score: Slight disability Modified Rankin: Moderately severe disability     Balance Overall balance assessment: Needs assistance Sitting-balance support: Feet supported Sitting balance-Leahy Scale: Good     Standing balance support: No upper extremity supported;Single extremity supported;During functional activity Standing balance-Leahy Scale: Fair Standing balance comment: able to static stand without UE support                             Cognition Arousal/Alertness: Awake/alert Behavior During Therapy: Flat affect Overall Cognitive Status: Difficult to assess                                        Exercises      General Comments        Pertinent Vitals/Pain Pain Assessment: No/denies pain Faces Pain Scale: No hurt    Home Living  Prior Function            PT Goals (current goals can now be found in the care plan section) Acute Rehab PT Goals Patient Stated Goal: not stated PT Goal Formulation: With patient/family Time For Goal Achievement: 03/25/20 Potential to Achieve Goals: Good Progress towards PT goals: Progressing toward goals    Frequency    Min 3X/week      PT Plan Current plan remains appropriate    Co-evaluation              AM-PAC PT "6 Clicks" Mobility   Outcome Measure  Help needed turning from your  back to your side while in a flat bed without using bedrails?: None Help needed moving from lying on your back to sitting on the side of a flat bed without using bedrails?: None Help needed moving to and from a bed to a chair (including a wheelchair)?: A Little Help needed standing up from a chair using your arms (e.g., wheelchair or bedside chair)?: None Help needed to walk in hospital room?: A Little Help needed climbing 3-5 steps with a railing? : A Little 6 Click Score: 21    End of Session Equipment Utilized During Treatment: Gait belt Activity Tolerance: Patient tolerated treatment well Patient left: in chair;with call bell/phone within reach;with chair alarm set;with family/visitor present Nurse Communication: Mobility status PT Visit Diagnosis: Other abnormalities of gait and mobility (R26.89);Other symptoms and signs involving the nervous system (R29.898)     Time: 1345-1401 PT Time Calculation (min) (ACUTE ONLY): 16 min  Charges:  $Gait Training: 8-22 mins                     Rolinda Roan, PT, DPT Acute Rehabilitation Services Pager: [phone] Office: [place] 03/14/2020, 3:00 PM

## 2020-03-14 NOTE — Progress Notes (Signed)
STROKE TEAM PROGRESS NOTE   INTERVAL HISTORY Patient is sitting up in a chair.  She is neurologically stable.  No changes.  Vital signs are stable.  Awaiting bed in skilled nursing facility neurological exam unchanged and continues to have right-sided peripheral vision loss  Vitals:   03/14/20 0019 03/14/20 0324 03/14/20 0754 03/14/20 1100  BP: 136/74 (!) 144/76  (!) 150/82  Pulse: 80 86  82  Resp: 18 19  18   Temp: 98 F (36.7 C) 98 F (36.7 C)  98 F (36.7 C)  TempSrc: Oral Oral  Oral  SpO2: 98% 98% 99% 99%  Height:       CBC:  Recent Labs  Lab 03/10/20 1509  WBC 10.2  NEUTROABS 9.0*  HGB 13.9  HCT 41.8  MCV 89.7  PLT 017   Basic Metabolic Panel:  Recent Labs  Lab 03/10/20 1509  NA 137  K 4.4  CL 98  CO2 26  GLUCOSE 116*  BUN 23  CREATININE 1.17*  CALCIUM 9.7    IMAGING past 24 hours No results found.  PHYSICAL EXAM     Pleasant elderly Caucasian lady not in distress. . Afebrile. Head is nontraumatic. Neck is supple without bruit.    Cardiac exam no murmur or gallop. Lungs are clear to auscultation. Distal pulses are well felt. Neurological Exam :  She is awake alert oriented to time place and person.  Extraocular movements are full range without nystagmus.  Right homonymous hemianopsia.  Speech is clear without dysarthria or aphasia.  Right lower facial asymmetry.  Tongue midline.  Motor system exam shows no upper or lower extremity drift but mild weakness of right grip and intrinsic hand muscles.  Orbits left over right upper extremity.  Mild weakness of right hip flexors and ankle dorsiflexors.  Sensation appears preserved bilaterally.  Gait not tested.  ASSESSMENT/PLAN Ms. VENERA PRIVOTT is a 74 y.o. female with history of recurrent breast cancer undercoing XRT, primary biliary cirrhosis, MGUS, AF on aspirin (not on AC d/t fall risk), limited ROM R shoulder d/t arthropathy, HTN, HLD, CKD, COPD presenting with R sided weakness and incontinence. Received tPA  03/10/2020 at 1547 per MAR. Found to have hyperdense L MCA and sent to IR.  Stroke:   L MCA infarct s/p partial revascularization L M2. Infarct embolic secondary to known AF not on [facility]  Code Stroke CT head acute L MCA infarct w/ hyperdense L MCA. ASPECTS 7    CTA head & neck proximal L M2 inferior division occlusion. Severe distal L P2 stenosis. Aortic atherosclerosis.   Cerebral angio proximal occlusion L M2 branch. Completed revascularization w/ slow flow distally TICI2c.   Post IR CT no hemorrhage    24h CT L MCA infarct. Petechial hemorrhage, mild.  2D Echo EF 55-60%. No source of embolus. Bioprosthetic MV  LDL 129  HgbA1c 5.7  VTE prophylaxis - lovenox   aspirin 81 mg daily prior to admission, now on aspirin 81 mg daily for now. Plan Eliquis at d/c   Therapy recommendations:  CIR->SNF . TOC team involved in securing place.  Disposition:  pending   COVID testing ordered for planned SNF d/c pending   Atrial Fibrillation  Home anticoagulation:  none d/t fall risk   eliquis at d/c   Hypertension  Home meds:  Lisinopril 5, diltiazem CD 180, lasix 20  Resume lasix and K  Stable . BP goal <180  . Long-term BP goal normotensive  Hyperlipidemia  Home meds:  zetia 10  and pravachol 20, resumed in hospital  LDL 129, goal < 70  Continue statin at discharge  Dysphagia, resolved . Secondary to stroke . Cleared for reg diet, thin liquids . Speech on board   Other Stroke Risk Factors  Advanced age  Former Cigarette smoker, quit 21 yrs ago  Obesity, Body mass index is 33.43 kg/m., recommend weight loss, diet and exercise as appropriate   Hx Congestive heart failure  Pacer  Other Active Problems  breast cancer, currently off tamoxifen d/t chemo  CKD stage 3    COPD on inhaler   Fe deficiency anemia  Depression on effexor   On prednisone   D/c IVF   Hospital day # 4 Continue ongoing treatment.  Patient medically stable to be transferred to  skilled nursing facility when bed available   Antony Contras, MD  To contact Stroke Continuity provider, please refer to [email or web address] After hours, contact General Neurology

## 2020-03-14 NOTE — Progress Notes (Signed)
  Speech Language Pathology Treatment: Cognitive-Linquistic (Aphasia )  Patient Details Name: Adriana Martin MRN: [id number] DOB: [birth year] Today's Date: 03/14/2020 Time: 0600-4599 SLP Time Calculation (min) (ACUTE ONLY): 24 min  Assessment / Plan / Recommendation Clinical Impression  Pt was seen for aphasia treatment. She  was alert and cooperative throughout the session. Her verbal output was notably improved compared to that which was observed during the initial evaluation. Pt indicated that she believes her language skills are improved but stated that she hears what is said but cannot always "figure it out". She demonstrated 60% accuracy with confrontational naming increasing to 70% accuracy with additional processing time and to 100% with verbal prompts and phonemic cues. She consistently required choice cues for auditory comprehension of paragraph-level material when open-ended questions were asked. She achieved 60% accuracy with complex yes/no questions increasing to 80% with rephrasing. Pt demonstrated 100% accuracy with identification of written words from a field of six. She achieved 60% accuracy with reading comprehension of sentences increasing to 100% with cues. SLP will continue to follow pt.    HPI HPI: Pt is a 74 y.o. female transferred from [facility]  with R UE weakness, and acute onset aphasia. TPA administered, CTA of head showed M2 occlusion.  CT head: Acute left MCA infarct with hyperdense left MCA. She underwent mechanical thrombectomy. PMH includes: thrombocytopenia, SOB, permanent cardiac pacemaker, h/o radiation therapy due to breast CA, COPD, A-Fib, CHF, s/p Rt reverse shoulder arthroplasty, s/p MVR, s/p Rt mastectomy with axillary lymph node dissection.       SLP Plan  Continue with current plan of care       Recommendations                   Follow up Recommendations: Skilled Nursing facility SLP Visit Diagnosis: Aphasia (R47.01) Plan: Continue with current plan  of care       Laiza Veenstra I. Hardin Negus, [place], [place] Office number [phone] Pager [place] 03/14/2020, 1:24 PM

## 2020-03-14 NOTE — Progress Notes (Signed)
SNF authorization:  Ref #: [id number]  Active: 6/24-6/28/2021  Adriana Martin is the CM: Fax: [phone]

## 2020-03-14 NOTE — Discharge Summary (Addendum)
Stroke Discharge Summary  Patient ID: Adriana Martin   MRN: [id number]      DOB: 19-Aug-1946  Date of Admission: 03/10/2020 Date of Discharge: 03/15/2020  Attending Physician:  Garvin Fila, MD, Stroke MD Consultant(s):   Pedro Earls MD (Interventional Neuroradiologist), Courtney Heys MD (Physical Medicine & Rehabtilitation)  Patient's PCP:  [name], MD  Discharge Diagnoses:  Principal Problem:   Acute ischemic stroke ([place]) L MCA s/p mechanical thrombectomy, embolic d/t AF not on [facility] Active Problems:   Atrial fibrillation ([place])   Chronic diastolic heart failure ([place])   Long term current use of anticoagulant   Carcinoma of overlapping sites of right breast in female, estrogen receptor positive ([place])   MGUS (monoclonal gammopathy of unknown significance)   Essential hypertension   Hyperlipidemia   Medications to be continued on Rehab Allergies as of 03/15/2020       Reactions   Atorvastatin Shortness Of Breath   Calcium Shortness Of Breath   Calcium Carbonate    Other reaction(s): Unknown   Fluoxetine    Other reaction(s): Unknown   Hydrochlorothiazide    Other reaction(s): Unknown        Medication List     STOP taking these medications    acetaminophen 500 MG tablet Commonly known as: TYLENOL   Advair Diskus 100-50 MCG/DOSE Aepb Generic drug: Fluticasone-Salmeterol Replaced by: mometasone-formoterol 100-5 MCG/ACT Aero   albuterol 108 (90 Base) MCG/ACT inhaler Commonly known as: VENTOLIN HFA   aspirin EC 81 MG tablet   Dialyvite/Zinc Tabs   tamoxifen 20 MG tablet Commonly known as: NOLVADEX       TAKE these medications    apixaban 5 MG Tabs tablet Commonly known as: ELIQUIS Take 1 tablet (5 mg total) by mouth 2 (two) times daily.   diltiazem 180 MG 24 hr capsule Commonly known as: CARDIZEM CD Take 180 mg by mouth daily.   ezetimibe 10 MG tablet Commonly known as: ZETIA Take 10 mg by mouth daily.   ferrous sulfate  325 (65 FE) MG tablet Take 325 mg by mouth daily with breakfast.   furosemide 20 MG tablet Commonly known as: LASIX Take 20 mg by mouth daily.   levothyroxine 50 MCG tablet Commonly known as: SYNTHROID Take 50 mcg by mouth daily before breakfast.   lisinopril 5 MG tablet Commonly known as: ZESTRIL Take 5 mg by mouth daily.   mometasone-formoterol 100-5 MCG/ACT Aero Commonly known as: DULERA Inhale 2 puffs into the lungs 2 (two) times daily. Replaces: Advair Diskus 100-50 MCG/DOSE Aepb   potassium chloride 10 MEQ tablet Commonly known as: KLOR-CON Take 20 mEq by mouth 2 (two) times daily.   pravastatin 20 MG tablet Commonly known as: PRAVACHOL Take 20 mg by mouth at bedtime.   predniSONE 5 MG tablet Commonly known as: DELTASONE Take 5 mg by mouth daily with breakfast.   PRESERVISION AREDS 2 PO Take 2 capsules by mouth daily. What changed: Another medication with the same name was removed. Continue taking this medication, and follow the directions you see here.   ursodiol 300 MG capsule Commonly known as: ACTIGALL Take 300 mg by mouth 2 (two) times daily.   venlafaxine XR 37.5 MG 24 hr capsule Commonly known as: EFFEXOR-XR Take 37.5 mg by mouth daily.   Vitamin D (Ergocalciferol) 1.25 MG (50000 UNIT) Caps capsule Commonly known as: DRISDOL Take 50,000 Units by mouth every 14 (fourteen) days.        LABORATORY STUDIES  CBC    Component Value Date/Time   WBC 10.2 03/10/2020 1509   RBC 4.66 03/10/2020 1509   HGB 13.9 03/10/2020 1509   HGB 11.1 (L) 12/30/2014 0921   HCT 41.8 03/10/2020 1509   HCT 34.4 (L) 12/30/2014 0921   PLT 297 03/10/2020 1509   PLT 482 (H) 12/30/2014 0921   MCV 89.7 03/10/2020 1509   MCV 88 12/30/2014 0921   MCH 29.8 03/10/2020 1509   MCHC 33.3 03/10/2020 1509   RDW 14.2 03/10/2020 1509   RDW 19.2 (H) 12/30/2014 0921   LYMPHSABS 0.7 03/10/2020 1509   LYMPHSABS 0.7 (L) 09/25/2014 1437   MONOABS 0.5 03/10/2020 1509   MONOABS 0.3  09/25/2014 1437   EOSABS 0.1 03/10/2020 1509   EOSABS 0.2 09/25/2014 1437   BASOSABS 0.0 03/10/2020 1509   BASOSABS 0.1 09/25/2014 1437   CMP    Component Value Date/Time   NA 137 03/10/2020 1509   NA 129 (L) 12/30/2014 0921   K 4.4 03/10/2020 1509   K 4.7 12/30/2014 0921   CL 98 03/10/2020 1509   CL 91 (L) 12/30/2014 0921   CO2 26 03/10/2020 1509   CO2 26 12/30/2014 0921   GLUCOSE 116 (H) 03/10/2020 1509   GLUCOSE 103 (H) 12/30/2014 0921   BUN 23 03/10/2020 1509   BUN 22 (H) 12/30/2014 0921   CREATININE 1.17 (H) 03/10/2020 1509   CREATININE 1.22 (H) 12/30/2014 0921   CALCIUM 9.7 03/10/2020 1509   CALCIUM 9.1 12/30/2014 0921   PROT 7.3 03/10/2020 1509   PROT 7.9 12/30/2014 0921   ALBUMIN 3.9 03/10/2020 1509   ALBUMIN 4.0 12/30/2014 0921   AST 31 03/10/2020 1509   AST 31 12/30/2014 0921   ALT 23 03/10/2020 1509   ALT 29 12/30/2014 0921   ALKPHOS 103 03/10/2020 1509   ALKPHOS 101 12/30/2014 0921   BILITOT 0.8 03/10/2020 1509   BILITOT 0.5 12/30/2014 0921   GFRNONAA 46 (L) 03/10/2020 1509   GFRNONAA 45 (L) 12/30/2014 0921   GFRAA 53 (L) 03/10/2020 1509   GFRAA 52 (L) 12/30/2014 0921   COAGS Lab Results  Component Value Date   INR 0.9 03/10/2020   INR 1.15 01/07/2018   INR 1.91 10/13/2017   Lipid Panel    Component Value Date/Time   CHOL 202 (H) 03/11/2020 0552   CHOL 276 (H) 08/20/2013 0335   TRIG 97 03/11/2020 0552   TRIG 121 08/20/2013 0335   HDL 54 03/11/2020 0552   HDL 81 (H) 08/20/2013 0335   CHOLHDL 3.7 03/11/2020 0552   VLDL 19 03/11/2020 0552   VLDL 24 08/20/2013 0335   LDLCALC 129 (H) 03/11/2020 0552   LDLCALC 171 (H) 08/20/2013 0335   HgbA1C  Lab Results  Component Value Date   HGBA1C 5.7 (H) 03/11/2020    SIGNIFICANT DIAGNOSTIC STUDIES CT HEAD WO CONTRAST  Result Date: 03/11/2020 CLINICAL DATA:  Stroke post thrombectomy, follow-up EXAM: CT HEAD WITHOUT CONTRAST TECHNIQUE: Contiguous axial images were obtained from the base of the skull  through the vertex without intravenous contrast. COMPARISON:  03/10/2020 FINDINGS: Brain: Hypoattenuation and loss of gray-white differentiation is present in the left parietotemporal lobes and posterior insula. This is more apparent than on the prior study keeping with expected evolution. There is some ill-defined areas hyperdensity, which may reflect petechial hemorrhage. There is no discrete hematoma. No significant mass effect. Chronic left occipital infarct. Small chronic right cerebellar infarct. Stable findings of chronic microvascular ischemic changes. Vascular: There remains hyperdensity along left MCA  branches within sylvian fissure. Skull: No acute abnormality. Sinuses/Orbits: No acute abnormality. Other: None. IMPRESSION: Evolving acute left MCA territory infarct. Some ill-defined areas of hyperdensity may reflect mild petechial hemorrhage. No discrete hematoma. No significant mass effect. Electronically Signed   By: Macy Mis M.D.   On: 03/11/2020 17:00   IR CT Head Ltd  Result Date: 03/11/2020 INDICATION: 74 year old female with past medical history significant for recurrent breast cancer undergoing radiation with last dose 03/08/2020, primary biliary cirrhosis, MGUS, A. 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 She was last seen well at 1 p.m. on 03/10/2020. Head CT showed early ischemic changes in the left MCA territory without hemorrhage. CT angiogram of the head and neck showed a proximal occlusion of a dominant left M2/MCA posterior division branch. Intravenous tPA was given at 15:47 at outside hospital and she was then transferred to our service for emergency mechanical thrombectomy. EXAM: Diagnostic cerebral angiogram and mechanical thrombectomy COMPARISON:  CT/CT angiogram of the head and neck March 10, 2020  MEDICATIONS: No antibiotics administered. ANESTHESIA/SEDATION: The procedure was performed and general anesthesia. FLUOROSCOPY TIME:  Fluoroscopy Time: 9 minutes 24 seconds (420.7 mGy). COMPLICATIONS: None immediate. TECHNIQUE: Informed written consent was obtained from the patient after a thorough discussion of the procedural risks, benefits and alternatives. All questions were addressed. Maximal Sterile Barrier Technique was utilized including caps, mask, sterile gowns, sterile gloves, sterile drape, hand hygiene and skin antiseptic. A timeout was performed prior to the initiation of the procedure. Real-time ultrasound guidance was utilized for vascular access including the acquisition of a permanent ultrasound image documenting patency of the accessed vessel. The right groin was prepped and draped in the usual sterile fashion. Using a micropuncture kit and the modified Seldinger technique, access was gained to the right common femoral artery and an 8 French sheath was placed. Then, a Zoom 88 guide catheter was navigated over a 6 [place] Berenstein 2 catheter and a 0.035 inch Terumo Glidewire into the aortic arch under fluoroscopic guidance. The catheter was placed into the left common carotid artery. Frontal and lateral road map were obtained. The Berenstein catheter was removed. Using biplane roadmap, a Zoom 71 aspiration catheter was navigated over a 0.014 inch Aristotle microguidewire into the upper cervical segment of the left ICA. Frontal and lateral angiograms of the head were obtained. FINDINGS: 1. Proximal occlusion of a dominant left M2/MCA posterior division branch with minimal collateral flow from the left ACA to the left MCA territory. 2. Distal occlusion is of a right M4 MCA anterior division branch. PROCEDURE: Under biplane roadmap, the Zoom 71 aspiration catheter was navigated into the left M1 segment over the Aristotle guide wire. The wire was removed and the catheter was connected to an aspiration  and advanced into the M2 segment. Continuous aspiration was maintained for 3 minutes. The aspiration catheter was then removed while maintaining continuous aspiration. The guiding catheter was aspirated and brisk back blood flow was noted. Magnified left ICA angiograms with frontal and lateral views of the head were obtained showing complete recanalization of the M2 segment. Left ICA angiograms with frontal and lateral views of the entire head showed complete recanalization of the left MCA M2 segment with persistent slow flow within a left M4/MCA anterior division branch (TICI2C). No embolus to distal or new territory. Flat panel CT of the head was obtained and post processed  in a separate workstation with concurrent attending physician supervision. Selected images were sent to PACS. No hemorrhagic complication noted. A right common femoral artery angiogram was performed. The access is in the mid common femoral artery. The femoral sheath was exchanged over the wire for a Perclose ProGlide which was utilized for access closure. Immediate hemostasis was achieved. IMPRESSION: 1. Successful mechanical thrombectomy for treatment of a proximal occlusion of a dominant left M2/MCA posterior division branch with direct contact aspiration. Complete recanalization of the posterior division achieved with persistent slow flow in a left M4/MCA anterior division branch (TICI2C). 2. No embolus to new or distal territory. 3. No hemorrhagic complication on postprocedural flat panel CT. PLAN: - Bed rest post femoral access 6h- SBP 120-140- STAT head CT for any acute neurological deterioration- ICU level of care Electronically Signed   By: Pedro Earls M.D.   On: 03/11/2020 11:15   IR US Guide Vasc Access Right  Result Date: 03/11/2020 INDICATION: 74 year old female with past medical history significant for recurrent breast cancer undergoing radiation with last dose 03/08/2020, primary biliary cirrhosis, MGUS, A.  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 She was last seen well at 1 p.m. on 03/10/2020. Head CT showed early ischemic changes in the left MCA territory without hemorrhage. CT angiogram of the head and neck showed a proximal occlusion of a dominant left M2/MCA posterior division branch. Intravenous tPA was given at 15:47 at outside hospital and she was then transferred to our service for emergency mechanical thrombectomy. EXAM: Diagnostic cerebral angiogram and mechanical thrombectomy COMPARISON:  CT/CT angiogram of the head and neck March 10, 2020 MEDICATIONS: No antibiotics administered. ANESTHESIA/SEDATION: The procedure was performed and general anesthesia. FLUOROSCOPY TIME:  Fluoroscopy Time: 9 minutes 24 seconds (420.7 mGy). COMPLICATIONS: None immediate. TECHNIQUE: Informed written consent was obtained from the patient after a thorough discussion of the procedural risks, benefits and alternatives. All questions were addressed. Maximal Sterile Barrier Technique was utilized including caps, mask, sterile gowns, sterile gloves, sterile drape, hand hygiene and skin antiseptic. A timeout was performed prior to the initiation of the procedure. Real-time ultrasound guidance was utilized for vascular access including the acquisition of a permanent ultrasound image documenting patency of the accessed vessel. The right groin was prepped and draped in the usual sterile fashion. Using a micropuncture kit and the modified Seldinger technique, access was gained to the right common femoral artery and an 8 French sheath was placed. Then, a Zoom 88 guide catheter was navigated over a 6 [place] Berenstein 2 catheter and a 0.035 inch Terumo Glidewire into the aortic arch under fluoroscopic guidance. The catheter was placed into the left common carotid artery.  Frontal and lateral road map were obtained. The Berenstein catheter was removed. Using biplane roadmap, a Zoom 71 aspiration catheter was navigated over a 0.014 inch Aristotle microguidewire into the upper cervical segment of the left ICA. Frontal and lateral angiograms of the head were obtained. FINDINGS: 1. Proximal occlusion of a dominant left M2/MCA posterior division branch with minimal collateral flow from the left ACA to the left MCA territory. 2. Distal occlusion is of a right M4 MCA anterior division branch. PROCEDURE: Under biplane roadmap, the Zoom 71 aspiration catheter was navigated into the left M1 segment over the Aristotle guide wire. The wire was removed and the catheter was connected to an aspiration and advanced  into the M2 segment. Continuous aspiration was maintained for 3 minutes. The aspiration catheter was then removed while maintaining continuous aspiration. The guiding catheter was aspirated and brisk back blood flow was noted. Magnified left ICA angiograms with frontal and lateral views of the head were obtained showing complete recanalization of the M2 segment. Left ICA angiograms with frontal and lateral views of the entire head showed complete recanalization of the left MCA M2 segment with persistent slow flow within a left M4/MCA anterior division branch (TICI2C). No embolus to distal or new territory. Flat panel CT of the head was obtained and post processed in a separate workstation with concurrent attending physician supervision. Selected images were sent to PACS. No hemorrhagic complication noted. A right common femoral artery angiogram was performed. The access is in the mid common femoral artery. The femoral sheath was exchanged over the wire for a Perclose ProGlide which was utilized for access closure. Immediate hemostasis was achieved. IMPRESSION: 1. Successful mechanical thrombectomy for treatment of a proximal occlusion of a dominant left M2/MCA posterior division branch  with direct contact aspiration. Complete recanalization of the posterior division achieved with persistent slow flow in a left M4/MCA anterior division branch (TICI2C). 2. No embolus to new or distal territory. 3. No hemorrhagic complication on postprocedural flat panel CT. PLAN: - Bed rest post femoral access 6h- SBP 120-140- STAT head CT for any acute neurological deterioration- ICU level of care Electronically Signed   By: Pedro Earls M.D.   On: 03/11/2020 11:15   ECHOCARDIOGRAM COMPLETE  Result Date: 03/11/2020    ECHOCARDIOGRAM REPORT   Patient Name:   HARUNA ROHLFS Date of Exam: 03/11/2020 Medical Rec #:  [id number]        Height:       62.0 in Accession #:    [id number]       Weight:       182.8 lb Date of Birth:  07-19-1946        BSA:          1.840 m Patient Age:    55 years         BP:           97/77 mmHg Patient Gender: F                HR:           84 bpm. Exam Location:  Inpatient Procedure: 2D Echo Indications:    Stroke I163.9  History:        Patient has prior history of Echocardiogram examinations, most                 recent 06/07/2016. CHF, COPD, Arrythmias:Atrial Fibrillation;                 Risk Factors:Hypertension and Dyslipidemia.                  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  Sonographer:    Mikki Santee RDCS (AE) Referring Phys: [id number] ASHISH ARORA IMPRESSIONS  1. Akinesis of the distal inferior wall with overall normal LV function; s/p bioprosthetic MVR with mean gradient 10 mmHg and trace MR; mild LAE.  2. Left ventricular ejection fraction, by estimation, is 55 to 60%. The left ventricle has normal function. The left ventricle demonstrates regional wall motion abnormalities (see scoring diagram/findings for description).  Left ventricular diastolic parameters are indeterminate.  3. Right ventricular systolic function is normal. The right ventricular size is normal. Tricuspid regurgitation signal  is inadequate for assessing PA pressure.  4. The mitral valve is normal in structure. Trivial mitral valve regurgitation. No evidence of mitral stenosis. There is a bioprosthetic valve present in the mitral position.  5. The aortic valve is tricuspid. Aortic valve regurgitation is not visualized. No aortic stenosis is present.  6. The inferior vena cava is normal in size with greater than 50% respiratory variability, suggesting right atrial pressure of 3 mmHg. FINDINGS  Left Ventricle: Left ventricular ejection fraction, by estimation, is 55 to 60%. The left ventricle has normal function. The left ventricle demonstrates regional wall motion abnormalities. The left ventricular internal cavity size was normal in size. There is no left ventricular hypertrophy. Left ventricular diastolic parameters are indeterminate. Right Ventricle: The right ventricular size is normal. Right ventricular systolic function is normal. Tricuspid regurgitation signal is inadequate for assessing PA pressure. The tricuspid regurgitant velocity is 1.79 m/s, and with an assumed right atrial  pressure of 3 mmHg, the estimated right ventricular systolic pressure is 15.8 mmHg. Left Atrium: Left atrial size was normal in size. Right Atrium: Right atrial size was normal in size. Pericardium: There is no evidence of pericardial effusion. Mitral Valve: The mitral valve is normal in structure. Normal mobility of the mitral valve leaflets. Moderate mitral annular calcification. Trivial mitral valve regurgitation. There is a bioprosthetic valve present in the mitral position. No evidence of mitral valve stenosis. MV peak gradient, 20.1 mmHg. The mean mitral valve gradient is 10.0 mmHg. Tricuspid Valve: The tricuspid valve is normal in structure. Tricuspid valve regurgitation is trivial. No evidence of tricuspid stenosis. Aortic Valve: The aortic valve is tricuspid. Aortic valve regurgitation is not visualized. No aortic stenosis is present. Aortic valve  mean gradient measures 7.0 mmHg. Aortic valve peak gradient measures 15.8 mmHg. Aortic valve area, by VTI measures 1.86  cm. Pulmonic Valve: The pulmonic valve was normal in structure. Pulmonic valve regurgitation is not visualized. No evidence of pulmonic stenosis. Aorta: The aortic root is normal in size and structure. Venous: The inferior vena cava is normal in size with greater than 50% respiratory variability, suggesting right atrial pressure of 3 mmHg. IAS/Shunts: No atrial level shunt detected by color flow Doppler. Additional Comments: Akinesis of the distal inferior wall with overall normal LV function; s/p bioprosthetic MVR with mean gradient 10 mmHg and trace MR; mild LAE. A pacer wire is visualized.  LEFT VENTRICLE PLAX 2D LVIDd:         4.15 cm LVIDs:         3.20 cm LV PW:         1.10 cm LV IVS:        0.95 cm LVOT diam:     2.00 cm LV SV:         63 LV SV Index:   34 LVOT Area:     3.14 cm  LEFT ATRIUM             Index       RIGHT ATRIUM           Index LA diam:        4.60 cm 2.50 cm/m  RA Area:     15.40 cm LA Vol (A2C):   53.7 ml 29.19 ml/m RA Volume:   32.40 ml  17.61 ml/m LA Vol (A4C):   63.6 ml 34.57 ml/m LA  Biplane Vol: 58.2 ml 31.63 ml/m  AORTIC VALVE AV Area (Vmax):    2.02 cm AV Area (Vmean):   1.94 cm AV Area (VTI):     1.86 cm AV Vmax:           199.00 cm/s AV Vmean:          120.000 cm/s AV VTI:            0.337 m AV Peak Grad:      15.8 mmHg AV Mean Grad:      7.0 mmHg LVOT Vmax:         128.00 cm/s LVOT Vmean:        74.200 cm/s LVOT VTI:          0.200 m LVOT/AV VTI ratio: 0.59  AORTA Ao Root diam: 3.10 cm MITRAL VALVE              TRICUSPID VALVE MV Area (PHT): 1.91 cm   TR Peak grad:   12.8 mmHg MV Peak grad:  20.1 mmHg  TR Vmax:        179.00 cm/s MV Mean grad:  10.0 mmHg MV Vmax:       2.24 m/s   SHUNTS MV Vmean:      151.5 cm/s Systemic VTI:  0.20 m                           Systemic Diam: 2.00 cm Kirk Ruths MD Electronically signed by Kirk Ruths MD Signature  Date/Time: 03/11/2020/4:12:16 PM    Final    IR PERCUTANEOUS ART THROMBECTOMY/INFUSION INTRACRANIAL INC DIAG ANGIO  Result Date: 03/11/2020 INDICATION: 74 year old female with past medical history significant for recurrent breast cancer undergoing radiation with last dose 03/08/2020, primary biliary cirrhosis, MGUS, A. 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 She was last seen well at 1 p.m. on 03/10/2020. Head CT showed early ischemic changes in the left MCA territory without hemorrhage. CT angiogram of the head and neck showed a proximal occlusion of a dominant left M2/MCA posterior division branch. Intravenous tPA was given at 15:47 at outside hospital and she was then transferred to our service for emergency mechanical thrombectomy. EXAM: Diagnostic cerebral angiogram and mechanical thrombectomy COMPARISON:  CT/CT angiogram of the head and neck March 10, 2020 MEDICATIONS: No antibiotics administered. ANESTHESIA/SEDATION: The procedure was performed and general anesthesia. FLUOROSCOPY TIME:  Fluoroscopy Time: 9 minutes 24 seconds (420.7 mGy). COMPLICATIONS: None immediate. TECHNIQUE: Informed written consent was obtained from the patient after a thorough discussion of the procedural risks, benefits and alternatives. All questions were addressed. Maximal Sterile Barrier Technique was utilized including caps, mask, sterile gowns, sterile gloves, sterile drape, hand hygiene and skin antiseptic. A timeout was performed prior to the initiation of the procedure. Real-time ultrasound guidance was utilized for vascular access including the acquisition of a permanent ultrasound image documenting patency of the accessed vessel. The right groin was prepped and draped in the usual sterile fashion. Using a micropuncture kit and the modified Seldinger  technique, access was gained to the right common femoral artery and an 8 French sheath was placed. Then, a Zoom 88 guide catheter was navigated over a 6 [place] Berenstein 2 catheter and a 0.035 inch Terumo Glidewire into the aortic arch under fluoroscopic guidance. The catheter was placed into the left common carotid  artery. Frontal and lateral road map were obtained. The Berenstein catheter was removed. Using biplane roadmap, a Zoom 71 aspiration catheter was navigated over a 0.014 inch Aristotle microguidewire into the upper cervical segment of the left ICA. Frontal and lateral angiograms of the head were obtained. FINDINGS: 1. Proximal occlusion of a dominant left M2/MCA posterior division branch with minimal collateral flow from the left ACA to the left MCA territory. 2. Distal occlusion is of a right M4 MCA anterior division branch. PROCEDURE: Under biplane roadmap, the Zoom 71 aspiration catheter was navigated into the left M1 segment over the Aristotle guide wire. The wire was removed and the catheter was connected to an aspiration and advanced into the M2 segment. Continuous aspiration was maintained for 3 minutes. The aspiration catheter was then removed while maintaining continuous aspiration. The guiding catheter was aspirated and brisk back blood flow was noted. Magnified left ICA angiograms with frontal and lateral views of the head were obtained showing complete recanalization of the M2 segment. Left ICA angiograms with frontal and lateral views of the entire head showed complete recanalization of the left MCA M2 segment with persistent slow flow within a left M4/MCA anterior division branch (TICI2C). No embolus to distal or new territory. Flat panel CT of the head was obtained and post processed in a separate workstation with concurrent attending physician supervision. Selected images were sent to PACS. No hemorrhagic complication noted. A right common femoral artery angiogram was performed. The  access is in the mid common femoral artery. The femoral sheath was exchanged over the wire for a Perclose ProGlide which was utilized for access closure. Immediate hemostasis was achieved. IMPRESSION: 1. Successful mechanical thrombectomy for treatment of a proximal occlusion of a dominant left M2/MCA posterior division branch with direct contact aspiration. Complete recanalization of the posterior division achieved with persistent slow flow in a left M4/MCA anterior division branch (TICI2C). 2. No embolus to new or distal territory. 3. No hemorrhagic complication on postprocedural flat panel CT. PLAN: - Bed rest post femoral access 6h- SBP 120-140- STAT head CT for any acute neurological deterioration- ICU level of care Electronically Signed   By: Pedro Earls M.D.   On: 03/11/2020 11:15   CT HEAD CODE STROKE WO CONTRAST`  Result Date: 03/10/2020 CLINICAL DATA:  Code stroke.  Expressive aphasia. EXAM: CT HEAD WITHOUT CONTRAST TECHNIQUE: Contiguous axial images were obtained from the base of the skull through the vertex without intravenous contrast. COMPARISON:  11/03/2018 FINDINGS: Brain: There is loss of gray-white differentiation in the posterior left insula, posterior left temporal lobe, and left parietal lobe consistent with an acute infarct. No intracranial hemorrhage, mass, midline shift, or extra-axial fluid collection is identified. A small chronic left occipital infarct in the PCA territory is new from the prior CT. A small chronic right cerebellar infarct is unchanged. Hypodensities in the cerebral white matter bilaterally are similar to the prior CT and nonspecific but compatible with mild chronic small vessel ischemic disease. Mild cerebral atrophy is within normal limits for age. Vascular: Calcified atherosclerosis at the skull base. Hyperdense left MCA in the distal M1/bifurcation region. Skull: The visualized paranasal sinus that no fracture or suspicious osseous lesion.  Sinuses/Orbits: Visualized paranasal sinuses and mastoid air cells are clear. Bilateral cataract extraction. Other: None. ASPECTS [facility] Stroke Program Early CT Score) - Ganglionic level infarction (caudate, lentiform nuclei, internal capsule, insula, M1-M3 cortex): 5 - Supraganglionic infarction (M4-M6 cortex): 2 Total score (0-10 with 10 being normal): 7 IMPRESSION: 1.  Acute left MCA infarct with hyperdense left MCA. No intracranial hemorrhage. 2. ASPECTS is 7. These results were called by telephone at the time of interpretation on 03/10/2020 at 3:19 pm to [facility], who verbally acknowledged these results. Electronically Signed   By: Logan Bores M.D.   On: 03/10/2020 15:21   CT ANGIO HEAD CODE STROKE  Result Date: 03/10/2020 CLINICAL DATA:  Aphasia. EXAM: CT ANGIOGRAPHY HEAD AND NECK TECHNIQUE: Multidetector CT imaging of the head and neck was performed using the standard protocol during bolus administration of intravenous contrast. Multiplanar CT image reconstructions and MIPs were obtained to evaluate the vascular anatomy. Carotid stenosis measurements (when applicable) are obtained utilizing NASCET criteria, using the distal internal carotid diameter as the denominator. CONTRAST:  38m OMNIPAQUE IOHEXOL 350 MG/ML SOLN COMPARISON:  None. FINDINGS: CTA NECK FINDINGS Aortic arch: Standard 3 vessel aortic arch with mild atherosclerotic plaque. No significant arch vessel origin stenosis. Right carotid system: Patent without evidence of stenosis, dissection, or significant atherosclerosis. Retropharyngeal course of the proximal ICA. Left carotid system: Patent with minimal calcified plaque at the carotid bifurcation. No evidence of stenosis or dissection. Retropharyngeal course of the proximal ICA. Vertebral arteries: Patent without evidence of stenosis or dissection. Mildly dominant left vertebral artery. Skeleton: Asymmetrically advanced facet arthrosis on the left at C2-3 and C3-4 with associated grade  1 anterolisthesis. Moderate to severe multilevel cervical disc degeneration. Other neck: No evidence of cervical lymphadenopathy or mass. Upper chest: Respiratory motion artifact without apical lung consolidation or mass. Partially visualized left subclavian approach pacemaker. Review of the MIP images confirms the above findings CTA HEAD FINDINGS Anterior circulation: The internal carotid arteries are patent from skull base to carotid termini with minimal nonstenotic plaque bilaterally. The left M1 segment is widely patent, however there is occlusion of the dominant left M2 inferior division just beyond the bifurcation with mild reconstitution of distal branch vessels. The right MCA and both ACAs are patent without evidence of a proximal branch occlusion or significant proximal stenosis. No aneurysm is identified. Posterior circulation: The intracranial vertebral arteries are widely patent to the basilar. Patent right PICA, left AICA, and bilateral SCAs are seen. The basilar artery is widely patent. There are diminutive posterior communicating arteries. Both PCAs are patent without evidence of a significant proximal stenosis, however there is a severe distal P2 stenosis on the left. No aneurysm is identified. Venous sinuses: Patent. Anatomic variants: None. Review of the MIP images confirms the above findings IMPRESSION: 1. Proximal left M2 inferior division occlusion. 2. Severe distal left P2 stenosis. 3. Widely patent carotid and vertebral arteries. 4.  Aortic Atherosclerosis (ICD10-I70.0). These results were called by telephone at the time of interpretation on 03/10/2020 at 4:24 pm to Dr. NRuffin Frederick who verbally acknowledged these results. Electronically Signed   By: ALogan BoresM.D.   On: 03/10/2020 16:40   CT ANGIO NECK CODE STROKE  Result Date: 03/10/2020 CLINICAL DATA:  Aphasia. EXAM: CT ANGIOGRAPHY HEAD AND NECK TECHNIQUE: Multidetector CT imaging of the head and neck was performed using the  standard protocol during bolus administration of intravenous contrast. Multiplanar CT image reconstructions and MIPs were obtained to evaluate the vascular anatomy. Carotid stenosis measurements (when applicable) are obtained utilizing NASCET criteria, using the distal internal carotid diameter as the denominator. CONTRAST:  678mOMNIPAQUE IOHEXOL 350 MG/ML SOLN COMPARISON:  None. FINDINGS: CTA NECK FINDINGS Aortic arch: Standard 3 vessel aortic arch with mild atherosclerotic plaque. No significant arch vessel origin stenosis. Right carotid system: Patent  without evidence of stenosis, dissection, or significant atherosclerosis. Retropharyngeal course of the proximal ICA. Left carotid system: Patent with minimal calcified plaque at the carotid bifurcation. No evidence of stenosis or dissection. Retropharyngeal course of the proximal ICA. Vertebral arteries: Patent without evidence of stenosis or dissection. Mildly dominant left vertebral artery. Skeleton: Asymmetrically advanced facet arthrosis on the left at C2-3 and C3-4 with associated grade 1 anterolisthesis. Moderate to severe multilevel cervical disc degeneration. Other neck: No evidence of cervical lymphadenopathy or mass. Upper chest: Respiratory motion artifact without apical lung consolidation or mass. Partially visualized left subclavian approach pacemaker. Review of the MIP images confirms the above findings CTA HEAD FINDINGS Anterior circulation: The internal carotid arteries are patent from skull base to carotid termini with minimal nonstenotic plaque bilaterally. The left M1 segment is widely patent, however there is occlusion of the dominant left M2 inferior division just beyond the bifurcation with mild reconstitution of distal branch vessels. The right MCA and both ACAs are patent without evidence of a proximal branch occlusion or significant proximal stenosis. No aneurysm is identified. Posterior circulation: The intracranial vertebral arteries are  widely patent to the basilar. Patent right PICA, left AICA, and bilateral SCAs are seen. The basilar artery is widely patent. There are diminutive posterior communicating arteries. Both PCAs are patent without evidence of a significant proximal stenosis, however there is a severe distal P2 stenosis on the left. No aneurysm is identified. Venous sinuses: Patent. Anatomic variants: None. Review of the MIP images confirms the above findings IMPRESSION: 1. Proximal left M2 inferior division occlusion. 2. Severe distal left P2 stenosis. 3. Widely patent carotid and vertebral arteries. 4.  Aortic Atherosclerosis (ICD10-I70.0). These results were called by telephone at the time of interpretation on 03/10/2020 at 4:24 pm to Dr. Ruffin Frederick, who verbally acknowledged these results. Electronically Signed   By: Logan Bores M.D.   On: 03/10/2020 16:40       HISTORY OF PRESENT ILLNESS ZIYON CEDOTAL is a 74 y.o. female past medical history of recurrent breast cancer, she is undergoing radiation with last dose 03/08/2020, primary biliary cirrhosis, MGUS, A. 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 Last known well at 1 PM on 03/10/2020 per her daughter that was at bedside. Presented to [facility] hospital.  Evaluated by telemedicine neurology. NIH stroke scale at [place] 10 due to profound aphasia and right upper extremity greater than lower extremity weakness. CT head with aspects 7 with hypoattenuation of the left MCA and hyperdense distal left M1 segment. Telemedicine neurologist confirmed with the radiologist that hypodensities are not consistent with actual ischemia. CTA performed that showed proximal right M2 inferior division occlusion. IV tPA given at 1547 hrs. at [facility]. Telemetry neurologist discussed the case with  Dr. Ladean Raya over the phone. After that they called [facility] to contact Dr. Rory Percy and requested admission and transfer. Patient was accepted to the IR suite.   HOSPITAL COURSE Ms. KAFI DOTTER is a 74 y.o. female with history of recurrent breast cancer undercoing XRT, primary biliary cirrhosis, MGUS, AF on aspirin (not on AC d/t fall risk), limited ROM R shoulder d/t arthropathy, HTN, HLD, CKD, COPD presenting ro [facility] with R sided weakness and incontinence. Received tPA 03/10/2020 at 1547 per MAR. Found to have hyperdense L MCA and sent to IR.   Stroke:   L  MCA infarct s/p partial revascularization L M2. Infarct embolic secondary to known AF not on [facility] Code Stroke CT head acute L MCA infarct w/ hyperdense L MCA. ASPECTS 7   CTA head & neck proximal L M2 inferior division occlusion. Severe distal L P2 stenosis. Aortic atherosclerosis.  Cerebral angio proximal occlusion L M2 branch. Completed revascularization w/ slow flow distally TICI2c.  Post IR CT no hemorrhage   24h CT L MCA infarct. Petechial hemorrhage, mild. 2D Echo EF 55-60%. No source of embolus. Bioprosthetic MV LDL 129 HgbA1c 5.7 aspirin 81 mg daily prior to admission, now on aspirin 81 mg daily for now. Plan Eliquis at d/c  Therapy recommendations:  CIR->SNF Disposition:  SNF   Atrial Fibrillation Home anticoagulation:  none d/t fall risk  eliquis at d/c   Hypertension Home meds:  Lisinopril 5, diltiazem CD 180, lasix 20 Resumed lasix and K Stable Long-term BP goal normotensive   Hyperlipidemia Home meds:  zetia 10 and pravachol 20, resumed in hospital LDL 129, goal < 70 Continue statin at discharge   Dysphagia, resolved Secondary to stroke Cleared for reg diet, thin liquids Speech on board   Other Stroke Risk Factors Advanced age Former Cigarette smoker, quit 21 yrs ago Obesity, Body mass index is 33.43 kg/m., recommend weight loss, diet and exercise as appropriate  Hx  Congestive heart failure Pacer   Other Active Problems breast cancer, currently off tamoxifen d/t chemo CKD stage 3   COPD on inhaler  Fe deficiency anemia Depression on effexor  On prednisone   DISCHARGE EXAM Blood pressure 129/68, pulse 82, temperature 98.1 F (36.7 C), temperature source Oral, resp. rate 18, height _0  (1.575 m), SpO2 95 %. Pleasant elderly Caucasian lady not in distress. . Afebrile. Head is nontraumatic. Neck is supple without bruit.    Cardiac exam no murmur or gallop. Lungs are clear to auscultation. Distal pulses are well felt. Neurological Exam :  She is awake alert oriented to time place and person.  Extraocular movements are full range without nystagmus.  Right homonymous hemianopsia.  Speech is clear without dysarthria or aphasia.  Right lower facial asymmetry.  Tongue midline.  Motor system exam shows no upper or lower extremity drift but mild weakness of right grip and intrinsic hand muscles.  Orbits left over right upper extremity.  Mild weakness of right hip flexors and ankle dorsiflexors.  Sensation appears preserved bilaterally.  Gait not tested.  Discharge Diet  Heart healthy thin liquids  DISCHARGE PLAN Disposition:  Transfer to [place] for ongoing PT, OT and ST Eliquis (apixaban) daily for secondary stroke prevention  Recommend ongoing stroke risk factor control by Primary Care Physician at time of discharge from inpatient rehabilitation. Follow-up PCP [name], MD in 2 weeks following discharge from rehab. Follow-up in [facility] in 4 weeks following discharge from rehab, office to schedule an appointment.    40 minutes were spent preparing discharge.  Mikey Bussing PA-C [facility] Pager [phone] 03/15/2020, 1:08 PM  I have personally obtained history,examined this patient, reviewed notes, independently viewed imaging studies, participated in medical decision making  and plan of care.ROS completed by me personally and pertinent positives fully documented  I have made any additions or clarifications directly to the above note. Agree with note above.   Antony Contras, MD Medical Director [place] Pager: [phone] 03/15/2020 2:04 PM

## 2020-03-14 NOTE — Plan of Care (Signed)
  Problem: Education: Goal: Knowledge of General Education information will improve Description Including pain rating scale, medication(s)/side effects and non-pharmacologic comfort measures Outcome: Progressing   

## 2020-03-15 MED ORDER — APIXABAN 5 MG PO TABS
5.0000 mg | ORAL_TABLET | Freq: Two times a day (BID) | ORAL | Status: DC
  Administered 2020-03-15: 5 mg via ORAL
  Filled 2020-03-15: qty 1

## 2020-03-15 MED ORDER — MOMETASONE FURO-FORMOTEROL FUM 100-5 MCG/ACT IN AERO: 2.0000 | INHALATION_SPRAY | Freq: Two times a day (BID) | RESPIRATORY_TRACT | 1 refills | Status: DC

## 2020-03-15 MED ORDER — APIXABAN 5 MG PO TABS: 5.0000 mg | ORAL_TABLET | Freq: Two times a day (BID) | ORAL | 1 refills | Status: DC

## 2020-03-15 NOTE — TOC Transition Note (Signed)
Transition of Care [facility]) - CM/SW Discharge Note   Patient Details  Name: Adriana Martin MRN: [id number] Date of Birth: [birth year]  Transition of Care [facility]) CM/SW Contact:  Pollie Friar, RN Phone Number: 03/15/2020, 1:34 PM   Clinical Narrative:    Pt discharging to [facility] today. CM has updated her daughter, [name]. Pt to transport via PTAR. Report called to PTAR and bedside RN updated. D/c packet at the desk.   Room 317 Number for report: [id number]   Final next level of care: Skilled Nursing Facility Barriers to Discharge: No Barriers Identified   Patient Goals and CMS Choice   CMS Medicare.gov Compare Post Acute Care list provided to:: Patient Represenative (must comment) Choice offered to / list presented to : Adult Children  Discharge Placement PASRR number recieved: 03/13/20            Patient chooses bed at: [facility] Patient to be transferred to facility by: [place] Name of family member notified: [name] Patient and family notified of of transfer: 03/15/20  Discharge Plan and Services In-house Referral: Clinical Social Work Discharge Planning Services: CM Consult Post Acute Care Choice: [place]                               Social Determinants of Health (SDOH) Interventions     Readmission Risk Interventions No flowsheet data found.

## 2020-03-15 NOTE — Discharge Instructions (Signed)

## 2020-03-15 NOTE — Progress Notes (Signed)
Donnie Coffin to be D/C'd Skilled nursing facility per MD order.  Discussed with the patient and all questions fully answered. Report called to SNF and and Leata Mouse received report and verbalized understanding of all information given to her.  VSS, Skin clean, dry and intact without evidence of skin break down, no evidence of skin tears noted. IV catheter discontinued intact. Site without signs and symptoms of complications. Dressing and pressure applied.  An After Visit Summary and prescription  printed and given to PTAR.    D/c education completed with PTAR including follow up instructions, medication list, d/c activities limitations if indicated, with other d/c instructions as indicated by MD -.    Patient escorted via  Biomedical scientist , and D/C home via ambulance.  Dorris Carnes 03/15/2020 5:19 PM

## 2020-03-15 NOTE — Progress Notes (Signed)
Occupational Therapy Treatment Patient Details Name: Adriana Martin MRN: [id number] DOB: [birth year] Today's Date: 03/15/2020    History of present illness This 74 y.o. female transferred from [facility]  with Rt UE weakness, acute onset aphasia. TpA administered,  CTA of head showed M2 occlusion.  She underwent mechanical thrombectomy.  PMH includes: thrombocytopenia, SOB, permanent cardiac pacemaker, h/o radiation therapy due to breast CA, COPD, A-Fib, CHF, s/p Rt reverse shoulder arthroplasty, s/p MVR, s/p Rt mastectomy with axillary lymph node dissection   OT comments  Pt making steady progress towards OT goals this session. Pt continues to present with aphasia, cognitive deficits, R sided weakness, balance impairments and decreased activity tolerance impacting pts ability to engage in BADLs. Overall, pt required MIN guard for functional mobility with RW, pt continues to present with R sided inattention mostly noticeable during mobility as pt with tendency to run into obstacles on R side. Instructed pt to state room numbers on R side during mobility with pt needing increased time for word finding- see cognition section for further details. Pt completed table top activity where pt instructed to locate all triangles amongst other shapes with pt unable to determine triangles vs squares. Agree with DC plan below, will follow.    Follow Up Recommendations  CIR;Supervision/Assistance - 24 hour    Equipment Recommendations  None recommended by OT    Recommendations for Other Services      Precautions / Restrictions Precautions Precautions: Fall Restrictions Weight Bearing Restrictions: No       Mobility Bed Mobility Overal bed mobility: Modified Independent Bed Mobility: Supine to Sit;Sit to Supine     Supine to sit: Modified independent (Device/Increase time) Sit to supine: Modified independent (Device/Increase time)   General bed mobility comments: no physical assist  needed  Transfers Overall transfer level: Needs assistance Equipment used: Rolling walker (2 wheeled) Transfers: Sit to/from Stand Sit to Stand: Min guard         General transfer comment: minguard mostly to keep gown closed    Balance Overall balance assessment: Needs assistance Sitting-balance support: Feet supported Sitting balance-Leahy Scale: Good     Standing balance support: No upper extremity supported;During functional activity Standing balance-Leahy Scale: Fair Standing balance comment: able to don mask in standing with no LOB                           ADL either performed or assessed with clinical judgement   ADL Overall ADL's : Needs assistance/impaired                     Lower Body Dressing: Min guard;Sitting/lateral leans Lower Body Dressing Details (indicate cue type and reason): to adjust socks from EOB Toilet Transfer: Minimal assistance;Ambulation;RW Toilet Transfer Details (indicate cue type and reason): simulated via functional mobility with RW; MIN A for balance and RW mgmt         Functional mobility during ADLs: Minimal assistance;Rolling walker General ADL Comments: pt continues to present with aphaisa, impaired balance, R sided weakness and decreased activity tolerance impacting pts ability to engage in BADLs     Vision Baseline Vision/History: Wears glasses Wears Glasses: At all times Patient Visual Report: Other (comment) (R inattention seems to have improved as pt able to complete table top activity with MOD cues as pt with difficulty problem solving but no visual deficits noted during activity, however did not pts inattention to obstacles on R side during mobility)  Perception     Praxis      Cognition Arousal/Alertness: Awake/alert Behavior During Therapy: Flat affect Overall Cognitive Status: Impaired/Different from baseline Area of Impairment: Problem solving                              Problem Solving: Slow processing General Comments: pt continues to present with aphasia with pt needing increased time and effort to state room numbers during mobility. pt often states "11" instead of "W". when asked to circle triangles during table top inattention task pt unable to determine triangle vs squares.        Exercises     Shoulder Instructions       General Comments encouraged pt to continue with crossword puzzles to assist with inattention    Pertinent Vitals/ Pain       Pain Assessment: No/denies pain  Home Living                                          Prior Functioning/Environment              Frequency  Min 2X/week        Progress Toward Goals  OT Goals(current goals can now be found in the care plan section)  Progress towards OT goals: Progressing toward goals  Acute Rehab OT Goals Patient Stated Goal: not stated OT Goal Formulation: With patient/family Time For Goal Achievement: 03/25/20 Potential to Achieve Goals: Good  Plan Discharge plan remains appropriate;Frequency remains appropriate    Co-evaluation                 AM-PAC OT "6 Clicks" Daily Activity     Outcome Measure   Help from another person eating meals?: A Little Help from another person taking care of personal grooming?: A Little Help from another person toileting, which includes using toliet, bedpan, or urinal?: A Little Help from another person bathing (including washing, rinsing, drying)?: A Little Help from another person to put on and taking off regular upper body clothing?: A Little Help from another person to put on and taking off regular lower body clothing?: A Little 6 Click Score: 18    End of Session Equipment Utilized During Treatment: Gait belt;Rolling walker  OT Visit Diagnosis: Unsteadiness on feet (R26.81);Cognitive communication deficit (R41.841) Symptoms and signs involving cognitive functions: Cerebral infarction   Activity  Tolerance Patient tolerated treatment well   Patient Left in bed;with call bell/phone within reach;with bed alarm set   Nurse Communication          Time: 9179-1505 OT Time Calculation (min): 16 min  Charges: OT General Charges $OT Visit: 1 Visit OT Treatments $Self Care/Home Management : 8-22 mins  Lanier Clam., COTA/L Acute Rehabilitation Services [phone] [place] 03/15/2020, 4:11 PM

## 2020-03-18 ENCOUNTER — Other Ambulatory Visit: Payer: Self-pay | Admitting: *Deleted

## 2020-03-18 NOTE — Patient Outreach (Signed)
[name]) Care Management  03/18/2020  Adriana Martin May 09, 1946 [id number]   RED ON EMMI ALERT - stroke Day # 1 Date: 6/27 Red Alert Reason: Unsure about follow up appointment   Outreach attempt #1, unsuccessful, HIPAA compliant voice message left.   Plan: RN CM will send unsuccessful outreach letter and follow up within the next 3-4 business days.  [name], [name], MSN [name] [phone]

## 2020-03-22 ENCOUNTER — Other Ambulatory Visit: Payer: Self-pay | Admitting: *Deleted

## 2020-03-22 NOTE — Patient Outreach (Signed)
[facility] [facility]) Care Management  03/22/2020  Adriana Martin [birth year] [id number]   RED ON EMMI ALERT - stroke Day # 1 Date: 6/27 Red Alert Reason: Unsure about follow up appointment   Outreach attempt #2, unsuccessful, HIPAA compliant voice message left.   Plan: RN CM will follow up within the next 3-4 business days.  [name], [name], MSN [place] [phone]

## 2020-03-26 ENCOUNTER — Encounter (HOSPITAL_COMMUNITY): Payer: Self-pay | Admitting: Radiology

## 2020-03-29 ENCOUNTER — Other Ambulatory Visit: Payer: Self-pay | Admitting: *Deleted

## 2020-03-29 NOTE — Patient Outreach (Signed)
[place] [facility]) Care Management  03/29/2020  JESSIKA ROTHERY September 13, 1946 [id number]   RED ON EMMI ALERT- stroke Day #1 Date:6/27 Red Alert Reason:Unsure about follow up appointment  Call received from daughter, member's identity verified.  This care manager introduced self and stated purpose of call.  [facility] care management services explained.  She report member was discharged from hospital on 6/25 after having stroke but went to rehab.  She was discharged from rehab yesterday.  Daughter lives in the home with her, assisting with ADL's.  She will have rehab through home health, think it is [facility], but unsure.  She will wait for call to schedule home visit.  Member does not have appointment with neurology yet, office will call to schedule for 6 weeks post discharge (around 8/6).  Daughter report she is managing member's recovery, denies questions regarding medications.  She is open to having this care manager call back for follow up in the event more questions arise.  This care manager will follow up within the next 2 weeks.    [name], [name], MSN [place] [phone]

## 2020-04-08 ENCOUNTER — Other Ambulatory Visit: Payer: Self-pay | Admitting: *Deleted

## 2020-04-08 NOTE — Patient Outreach (Signed)
[facility] [facility]) Care Management  04/08/2020  Adriana Martin 03-13-1946 [id number]  Call placed to member's daughter to follow up on discharge after having stroke  She report member has been recovering well.  She will start PT today and already has follow up appointment scheduled with neurology.  She denies any other questions at this time.  Will close case, advised her to contact this care manager with concerns.  [name], [name], MSN [place] [phone]

## 2020-04-15 ENCOUNTER — Ambulatory Visit
Admission: RE | Admit: 2020-04-15 | Discharge: 2020-04-15 | Disposition: A | Discharge status: Home / Self Care | Payer: Medicare Other | Source: Ambulatory Visit | Attending: Radiation Oncology | Admitting: Radiation Oncology

## 2020-04-15 ENCOUNTER — Other Ambulatory Visit: Payer: Self-pay

## 2020-04-15 ENCOUNTER — Encounter: Payer: Self-pay | Admitting: Radiation Oncology

## 2020-04-15 VITALS — BP 131/75 | HR 92 | Temp 97.4°F | Wt 174.0 lb

## 2020-04-15 DIAGNOSIS — C50811 Malignant neoplasm of overlapping sites of right female breast: Secondary | ICD-10-CM

## 2020-04-15 NOTE — Progress Notes (Signed)
Radiation Oncology Follow up Note  Name: Adriana Martin   Date:   04/15/2020 MRN:  [id number] DOB: 03-05-1946    This 74 y.o. 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  Status post accelerated partial breast radiation 7 years prior to the right breast  REFERRING PROVIDER: [name], MD  HPI: Patient is a 74 year old female now at 1 month having completed whole breast radiation to her right breast in a patient who received partial breast radiation to the same breast back 7 years prior for ER/PR positive disease.  She underwent a right modified radical mastectomy for a 4.3 cm invasive mammary carcinoma with margins clear but close at 0.5 mm from inferior margin as well as 1 mm from the deep margin.  1 of 10 lymph nodes was positive for macro metastatic disease at 2 mm.  She received both right chest wall and peripheral lymphatic radiation now at 1 month doing well she is without complaint she specifically denies any chest wall masses or nodularity cough or bone pain.  She had a recent CVA and is not on antiestrogen therapy at this time..  COMPLICATIONS OF TREATMENT: none  FOLLOW UP COMPLIANCE: keeps appointments   PHYSICAL EXAM:  BP (!) 131/75 (BP Location: Left Arm, Patient Position: Sitting, Cuff Size: Normal)    Pulse 92    Temp (!) 97.4 F (36.3 C) (Tympanic)    Wt 174 lb (78.9 kg)    BMI 31.83 kg/m  She is status post right modified radical mastectomy chest wall is clear without evidence of mass or nodularity left breast is free of dominant mass or nodularity.  Lungs are clear no evidence of lymphedema in her right upper extremity is noted.  No axillary or supraclavicular adenopathy is identified.  Well-developed well-nourished patient in NAD. HEENT reveals PERLA, EOMI, discs not visualized.  Oral cavity is clear. No oral mucosal lesions are  identified. Neck is clear without evidence of cervical or supraclavicular adenopathy. Lungs are clear to A&P. Cardiac examination is essentially unremarkable with regular rate and rhythm without murmur rub or thrill. Abdomen is benign with no organomegaly or masses noted. Motor sensory and DTR levels are equal and symmetric in the upper and lower extremities. Cranial nerves II through XII are grossly intact. Proprioception is intact. No peripheral adenopathy or edema is identified. No motor or sensory levels are noted. Crude visual fields are within normal range.  RADIOLOGY RESULTS: No current films for review  PLAN: Patient is now 1 month out from right chest wall peripheral emphatic radiation she is doing well she is without complaint.  I am pleased with her overall progress.  Of asked to see her back in 4 to 5 months for follow-up.  Patient knows to call with any concerns.  I would like to take this opportunity to thank you for allowing me to participate in the care of your patient.Noreene Filbert, MD

## 2020-04-23 NOTE — Progress Notes (Signed)
[facility]  [address], [address] [place], [place] [address] Phone: [phone]  Fax: [phone]   Clinic Day:  04/24/2020  Referring physician: [name], MD  Chief Complaint: Adriana Martin is a 74 y.o. female with recurrent right breast cancer, JAK2+thrombocytosis,and amonoclonal gammopathyof unknown significance (MGUS) who is seen for 3 month assessment.   HPI: The patient was last seen in the medical oncology clinic on 01/23/2020. At that time, she had felt well.  She noted some numbness in her arm s/p fracture.  He was tolerating tamoxifen and Effexor well. Hepatic function panel revealed AST 50, ALT 49, and alkaline phosphatase 128.   She received a 3 week hypofractionated course of right breast radiation (02/15/2020 - 03/08/2020).  Tamoxifen was held.  She presented to the ER with acute onset confusion and right-sided weakness on 03/10/2020. Code stroke was called and Head CT showed an acute left MCA infarct with hyperdense left MCA. There was no intracranial hemorrhage. ASPECTS was 7. Head and neck CTA revealed proximal left M2 inferior division occlusion. There was severe distal left P2 stenosis. The carotid and vertebral arteries were widely patent. There was aortic atherosclerosis. TPA was administered. She was transferred to [facility].  The patient was admitted to [facility] for acute ischemic stroke from 03/10/2020 - 03/15/2020 and was sent to IR for thrombectomy. Head CT on 03/11/2020 revealed an evolving acute left MCA territory infarct. There were some ill-defined areas of hyperdensity that may have reflected mild petechial hemorrhage. There was no discrete hematoma or significant mass effect. She was discharged to [facility] on Eliquis.  The patient saw Dr. Nehemiah Massed on 04/03/2020. She had no acute complaints at the time.  According to her caregiver, she had only been taking Eliquis once daily. Her dose was increased to 5  mg twice daily and aspirin was discontinued.   CBC followed 02/23/2020: Hematocrit 40.3, hemoglobin 13.1, platelets 348,000, WBC   9,400. 03/10/2020: Hematocrit 41.8, hemoglobin 13.9, platelets 297,000, WBC 10,200.  Hepatic function panel on 02/23/2020 revealed AST of 54 and ALT 63. Alkaline phosphatase was 107 (normal).  During the interim, she feels "real well".  She is not sure that she has fully recovered from the stroke. She has some trouble with her speech and struggles to get her thoughts out. She denies weakness and numbness in and parts of her body. She can swallow normally and denies shortness of breath.  According to her niece, the patient was previously on Eliquis for her heart. She was switched to aspirin after her arm fracture in 11/2018. After her stroke, she was switched back to Eliquis.  The patient received the [place] COVID-19 vaccine on 11/30/2019 and 12/26/2019.   Past Medical History:  Diagnosis Date  . Anemia   . Anxiety   . Atrial fibrillation ([place])   . B12 deficiency   . Breast cancer ([place])    RT Lumpectomy c radiation 2012  . Breast cancer ([place])   . CHF (congestive heart failure) ([place])   . Chronic kidney disease   . Cirrhosis ([place])   . COPD (chronic obstructive pulmonary disease) ([place])   . Depression   . Family history of breast cancer   . Family history of esophageal cancer   . Family history of multiple myeloma   . GERD (gastroesophageal reflux disease)   . Headache   . Heart murmur   . Hyperlipemia   . Hyperlipidemia   . Hypertension   . Hypothyroidism   .  Iron (Fe) deficiency anemia   . Personal history of radiation therapy   . Presence of permanent cardiac pacemaker   . Shortness of breath dyspnea   . Thrombocytopenia ([place])   . Vitamin D deficiency     Past Surgical History:  Procedure Laterality Date  . ABDOMINAL HYSTERECTOMY     Partial  . ABLATION    . APPENDECTOMY    . BICEPT TENODESIS Right 01/07/2018   Procedure: BICEPS  TENODESIS;  Surgeon: Leim Fabry, MD;  Location: [facility] ORS;  Service: Orthopedics;  Laterality: Right;  . BREAST BIOPSY Right 2012  . BREAST BIOPSY Right 07/21/2019   venus clip, [place] Bx, pending path   . BREAST LUMPECTOMY Right 2012   positive/rad  . BREAST SURGERY    . CARDIAC SURGERY    . CARDIAC VALVE REPLACEMENT    . COLONOSCOPY  2012  . ELECTROPHYSIOLOGIC STUDY N/A 01/23/2015   Procedure: CARDIOVERSION;  Surgeon: Corey Skains, MD;  Location: [facility] ORS;  Service: Cardiovascular;  Laterality: N/A;  . ELECTROPHYSIOLOGIC STUDY N/A 05/27/2016   Procedure: CARDIOVERSION;  Surgeon: Corey Skains, MD;  Location: [facility] ORS;  Service: Cardiovascular;  Laterality: N/A;  . ESOPHAGOGASTRODUODENOSCOPY  2012  . EYE SURGERY    . IR CT HEAD LTD  03/10/2020  . IR PERCUTANEOUS ART THROMBECTOMY/INFUSION INTRACRANIAL INC DIAG ANGIO  03/10/2020  . IR US GUIDE VASC ACCESS RIGHT  03/10/2020  . MASTECTOMY WITH AXILLARY LYMPH NODE DISSECTION Right 08/09/2019   Procedure: MASTECTOMY WITH AXILLARY LYMPH NODE DISSECTION;  Surgeon: Robert Bellow, MD;  Location: [facility] ORS;  Service: General;  Laterality: Right;  . MITRAL VALVE REPLACEMENT    . mvp    . RADIOLOGY WITH ANESTHESIA N/A 03/10/2020   Procedure: IR WITH ANESTHESIA;  Surgeon: Radiologist, Medication, MD;  Location: [place];  Service: Radiology;  Laterality: N/A;  . REVERSE SHOULDER ARTHROPLASTY Right 01/07/2018   Procedure: REVERSE SHOULDER ARTHROPLASTY;  Surgeon: Leim Fabry, MD;  Location: [facility] ORS;  Service: Orthopedics;  Laterality: Right;  Marland Kitchen VSD REPAIR      Family History  Problem Relation Age of Onset  . Breast cancer Mother        never treated, dx. >50  . Multiple myeloma Father   . Esophageal cancer Brother   . Breast cancer Daughter 45  . Cancer Daughter 15       rare blood cancer    Social History:  reports that she quit smoking about 21 years ago. Her smoking use included cigarettes. She has a 7.50 pack-year smoking history. She has  never used smokeless tobacco. She reports that she does not drink alcohol and does not use drugs. She lives in [place]. She currently lives with her daughter because of her stroke in 02/2020. The patient is accompanied by her niece, [name], today.   Allergies:  Allergies  Allergen Reactions  . Atorvastatin Shortness Of Breath  . Calcium Shortness Of Breath  . Calcium Carbonate     Other reaction(s): Unknown  . Fluoxetine     Other reaction(s): Unknown  . Hydrochlorothiazide     Other reaction(s): Unknown    Current Medications: Current Outpatient Medications  Medication Sig Dispense Refill  . apixaban (ELIQUIS) 5 MG TABS tablet Take 1 tablet (5 mg total) by mouth 2 (two) times daily. 60 tablet 1  . diltiazem (CARDIZEM CD) 180 MG 24 hr capsule Take 180 mg by mouth daily.    Marland Kitchen ezetimibe (ZETIA) 10 MG tablet Take 10 mg by mouth daily.    Marland Kitchen  ferrous sulfate 325 (65 FE) MG tablet Take 325 mg by mouth daily with breakfast.    . furosemide (LASIX) 20 MG tablet Take 20 mg by mouth daily.   1  . levothyroxine (SYNTHROID, LEVOTHROID) 50 MCG tablet Take 50 mcg by mouth daily before breakfast.    . lisinopril (PRINIVIL,ZESTRIL) 5 MG tablet Take 5 mg by mouth daily.   98  . mometasone-formoterol (DULERA) 100-5 MCG/ACT AERO Inhale 2 puffs into the lungs 2 (two) times daily. 8.8 g 1  . Multiple Vitamins-Minerals (PRESERVISION AREDS 2 PO) Take 2 capsules by mouth daily.    . potassium chloride (K-DUR,KLOR-CON) 10 MEQ tablet Take 20 mEq by mouth 2 (two) times daily.     . pravastatin (PRAVACHOL) 20 MG tablet Take 20 mg by mouth at bedtime.    . predniSONE (DELTASONE) 5 MG tablet Take 5 mg by mouth daily with breakfast.    . ursodiol (ACTIGALL) 300 MG capsule Take 300 mg by mouth 2 (two) times daily.     Marland Kitchen venlafaxine XR (EFFEXOR-XR) 37.5 MG 24 hr capsule Take 37.5 mg by mouth daily.    . Vitamin D, Ergocalciferol, (DRISDOL) 50000 units CAPS capsule Take 50,000 Units by mouth every 14 (fourteen) days.       No current facility-administered medications for this visit.    Review of Systems  Constitutional: Negative.  Negative for chills, diaphoresis, fever, malaise/fatigue and weight loss (up 2 lbs).  HENT: Negative.  Negative for congestion, ear discharge, ear pain, hearing loss, nosebleeds, sinus pain, sore throat and tinnitus.   Eyes: Negative.  Negative for blurred vision and double vision.  Respiratory: Negative for cough, hemoptysis, sputum production and shortness of breath (resolved).   Cardiovascular: Negative.  Negative for chest pain, palpitations and leg swelling.  Gastrointestinal: Negative.  Negative for abdominal pain, blood in stool, constipation, diarrhea, heartburn, melena, nausea and vomiting.  Genitourinary: Negative for dysuria, frequency, hematuria and urgency.       Chronic kidney disease.  Musculoskeletal: Negative for back pain, joint pain, myalgias and neck pain.       Right humoral shaft fracture on 12/05/2018.   Skin: Negative.  Negative for itching and rash.  Neurological: Negative for dizziness, tingling, sensory change, speech change, focal weakness, weakness and headaches.       S/p stroke in 02/2020. Struggling to get her thoughts out.  Endo/Heme/Allergies: Negative.  Does not bruise/bleed easily.  Psychiatric/Behavioral: Negative.  Negative for depression and memory loss. The patient is not nervous/anxious and does not have insomnia.   All other systems reviewed and are negative.  Performance status (ECOG): 2  Vitals Blood pressure 116/77, pulse 82, temperature (!) 97.2 F (36.2 C), temperature source Tympanic, resp. rate 18, weight 181 lb 1.7 oz (82.2 kg), SpO2 97 %.   Physical Exam Vitals and nursing note reviewed.  Constitutional:      General: She is not in acute distress.    Appearance: Normal appearance. She is well-developed. She is not diaphoretic.     Interventions: Face mask in place.     Comments: She has a cane by her side.  HENT:      Head: Normocephalic and atraumatic.     Mouth/Throat:     Mouth: No oral lesions.  Eyes:     General: No scleral icterus.    Conjunctiva/sclera: Conjunctivae normal.     Pupils: Pupils are equal, round, and reactive to light.     Comments: Glasses.  Blue eyes.  Cardiovascular:  Rate and Rhythm: Normal rate and regular rhythm.     Heart sounds: Normal heart sounds. No murmur heard.  No friction rub. No gallop.      Comments: Pacemaker. Bioprosthetic mitral valve. Pulmonary:     Effort: Pulmonary effort is normal.     Breath sounds: Normal breath sounds. No wheezing, rhonchi or rales.  Chest:     Breasts:        Right: Skin change (radiation tan) present.      Comments: Right mastectomy without erythema or nodularity. Abdominal:     General: Bowel sounds are normal.     Palpations: Abdomen is soft. There is no hepatomegaly, splenomegaly or mass.     Tenderness: There is no abdominal tenderness.  Musculoskeletal:        General: Normal range of motion.     Cervical back: Normal range of motion and neck supple.  Lymphadenopathy:     Cervical: No cervical adenopathy.     Right cervical: No superficial cervical adenopathy.    Lower Body: No right inguinal adenopathy. No left inguinal adenopathy.  Skin:    General: Skin is warm and dry.     Findings: No bruising, erythema, lesion or rash.  Neurological:     Mental Status: She is alert and oriented to person, place, and time.  Psychiatric:        Behavior: Behavior normal.        Thought Content: Thought content normal.        Judgment: Judgment normal.    Appointment on 04/24/2020  Component Date Value Ref Range Status  . Sodium 04/24/2020 138  135 - 145 mmol/L Final  . Potassium 04/24/2020 3.6  3.5 - 5.1 mmol/L Final  . Chloride 04/24/2020 100  98 - 111 mmol/L Final  . CO2 04/24/2020 27  22 - 32 mmol/L Final  . Glucose, Bld 04/24/2020 108* 70 - 99 mg/dL Final   Glucose reference range applies only to samples taken after  fasting for at least 8 hours.  . BUN 04/24/2020 29* 8 - 23 mg/dL Final  . Creatinine, Ser 04/24/2020 1.29* 0.44 - 1.00 mg/dL Final  . Calcium 04/24/2020 9.1  8.9 - 10.3 mg/dL Final  . Total Protein 04/24/2020 7.1  6.5 - 8.1 g/dL Final  . Albumin 04/24/2020 3.6  3.5 - 5.0 g/dL Final  . AST 04/24/2020 30  15 - 41 U/L Final  . ALT 04/24/2020 22  0 - 44 U/L Final  . Alkaline Phosphatase 04/24/2020 87  38 - 126 U/L Final  . Total Bilirubin 04/24/2020 0.7  0.3 - 1.2 mg/dL Final  . GFR calc non Af Amer 04/24/2020 41* >60 mL/min Final  . GFR calc Af Amer 04/24/2020 47* >60 mL/min Final  . Anion gap 04/24/2020 11  5 - 15 Final   Performed at [facility], [address] [place], [place] [address]  . WBC 04/24/2020 11.5* 4.0 - 10.5 K/uL Final  . RBC 04/24/2020 4.29  3.87 - 5.11 MIL/uL Final  . Hemoglobin 04/24/2020 13.0  12.0 - 15.0 g/dL Final  . HCT 04/24/2020 39.8  36 - 46 % Final  . MCV 04/24/2020 92.8  80.0 - 100.0 fL Final  . MCH 04/24/2020 30.3  26.0 - 34.0 pg Final  . MCHC 04/24/2020 32.7  30.0 - 36.0 g/dL Final  . RDW 04/24/2020 14.5  11.5 - 15.5 % Final  . Platelets 04/24/2020 222  150 - 400 K/uL Final  . nRBC 04/24/2020 0.0  0.0 - 0.2 % Final  . Neutrophils Relative % 04/24/2020 78  % Final  . Neutro Abs 04/24/2020 9.1* 1.7 - 7.7 K/uL Final  . Lymphocytes Relative 04/24/2020 10  % Final  . Lymphs Abs 04/24/2020 1.1  0.7 - 4.0 K/uL Final  . Monocytes Relative 04/24/2020 9  % Final  . Monocytes Absolute 04/24/2020 1.0  0 - 1 K/uL Final  . Eosinophils Relative 04/24/2020 1  % Final  . Eosinophils Absolute 04/24/2020 0.1  0 - 0 K/uL Final  . Basophils Relative 04/24/2020 1  % Final  . Basophils Absolute 04/24/2020 0.1  0 - 0 K/uL Final  . Immature Granulocytes 04/24/2020 1  % Final  . Abs Immature Granulocytes 04/24/2020 0.06  0.00 - 0.07 K/uL Final   Performed at [facility], [address], [place], [place] [address]    Assessment:  Adriana Martin is  a 74 y.o. female withrecurrent right breast cancer, JAK2+thrombocytosis, and amonoclonal gammopathyof unknown significance (MGUS).  She has a history of stageIrightbreast cancers/p lumpectomy and SLN biopsy on 02/18/2011.Pathologyrevealed a 1.1 cm grade IIductal carcinoma tumor. Tumor was ER+ (>90%),PR+ (>90%),andHer2/neunegative by FISH.Pathologic stagewas T1c pN0 (sn). She received accelerated partial breast irradiation(2012). She completedArimidexin 08/2016.   Bilateralscreeningmammogramon 06/22/2018 revealed no malignancy.Right unilateral breast mammogram and ultrasoundon 07/12/2019 revealed a suspicious 2.2 x 0.7 x 1.5 cm irregular hypoechoicmass in the right breast at 10 o'clock, with at least 3 possible satellite lesions(each measuring 3-4 mm)extending inferior and medial. There was no right axillary lymphadenopathy. Ultrasound guided biopsy was recommended for the dominant mass in the right breast.   Ultrasound guidedright breast core needle biopsyon 07/21/2019 revealed a 12 mm grade II invasive mammary carcinomawith lobular features.Tumor is ER + (> 90%), PR + (> 90%), and Her2/neu 1+.  She underwent a right mastectomy and SLN biopsy on 08/09/2019.  Pathology revealed a 4.3 x 2.1 x 2.0 cm grade II invasive lobular carcinoma. There was lobular carcinoma in situ focally present.  Margins were uninvolved by invasive carcinoma.  Closest margin was < 0.5 mm from the inferior and 1 mm from the deep margin.  There was 1 of 10 lymph nodes with metastatic carcinoma.  The largest metastatic deposit was 2 mm.  Tumor was ER was positive (>90%), PR positive (>90%), and Her2/neu 1+.   Pathologic stage was mpT2 p N1a.  Mammaprint revealed a low risk luminal-type (A).  From the MINDACT trial if lymph node negative, ER+ and Her2/neu - there was a 97.8% chance of living without distant recurrence of breast cancer at 5 years (DMFI).  She received a 3 week  hypofractionated course of right breast radiation (02/15/2020 - 03/08/2020).  CA27.29 has been followed: 26.5 on 04/15/2011 and 31.3 on 12/11/2019.   She has hadchronicthrombocytosissince2001.JAK2 V617Fwas positive on 12/14/2017. Platelet countranged between 400,000 - 500,000. She had a bone marrowin 2001 (no report available). She was previously onhydroxyurea(discontinued in 05/2016).    She has a monoclonal gammopathyof unknown significance (MGUS).SPEPon 03/26/2019revealed a0.3 gm/dL IgG monoclonal proteinwith lambda light chain specificity. Bone surveyon 12/14/2017 revealed scattered lytic lesions throughout the calvarium concerning for metastases or myeloma.There was compression fractures at T8 and L1(new since 2017).  Bone survey on 07/27/2019 showed a few scattered lucencies were noted in the calvarium, unchanged. There were no lytic myelomatous lesions identified in the remainder of the axial or appendicular skeleton. There was stable T8 and L1 compression fractures. There was a right total shoulder arthroplasty with remote appearing fracture below the  prosthesis (2020).  M-spikehas been followed (gm/dL): 0.3 on03/26/2019, 0.2 on 06/07/2018,and 0.2 on 06/06/2019.   Shehas stageIIIchronic kidney disease.Creatinineis 1.27 (CrCl 38.4 ml/min) on03/22/2021.  She hasa history ofiron deficiency anemia.Last colonoscopywasin 2012(no report available).Ferritinhas been followed: 66 on06/05/2014, 82 on 07/02/2015, 385 on09/26/2017 and 609 on06/19/2018. Iron saturation was 17% on 03/09/2017. She is not on oral iron.  Bone density on 12/18/2019 revealed osteopenia with a T-score of -2.1 in the right femoral neck.   The patient was seen in the [facility] ER and admitted to [facility] from 03/10/2020 - 03/15/2020 with an acute ischemic stroke.  She received TPA as well as thrombectomy. Head CT on 03/11/2020 revealed an evolving acute left MCA territory  infarct. She was discharged to [facility] on Eliquis.  The patient received the [place] COVID-19 vaccine on 11/30/2019 and 12/26/2019.  Symptomatically, she feels well.  She has been on Eliquis since 03/15/2020.  She denies any shortness of breath or chest pain.  She denies any breast concerns.  Exam is stable.  Plan: 1.   Labs today:  CBC with diff, CMP, CA27.29 2.   Recurrent right breast cancer(2020) She initially had right breast cancer in 2012.                         Sheunderwentlumpectomy and sentinel lymph node biopsy              She receivedaccelerated partial breast radiation and 5 years of Arimidex.             Mammogram on 06/27/2019 revealed right breast distortion.                         Right unilateral breast mammogram on 07/12/2019 revealed a suspicious 2.2 x 0.7 x 1.5 cm irregular hypoechoicmass.             Ultrasound guidedright breast core needle biopsyon 07/21/2019 revealed invasive mammary carcinomawith lobular features.            She underwent right mastectomy and SLN biopsy on 08/09/2019.               Pathology revealed a 4.3 x 2.1 x 2.0 cm grade II invasive lobular carcinoma.                             One of 10 lymph nodes revealed metastatic carcinoma.                           Tumor was ER positive (>90%), PR positive (>90%), and Her2/neu 1+.                            Pathologic stage was mpT2 p N1a.             Mammaprint revealed a low risk luminal-type (A).   She received a 3 week hypofractionated course of right breast radiation (02/15/2020 - 03/08/2020).              She remains on tamoxifen.   Discuss risk of thrombosis on tamoxifen.  CVA occurred when patient off anticoagulation   Continue tamoxifen for now.  Daughter to call clinic re: endocrine therapy.       3. Osteopenia  Continue calcium and vitamin D. 4.   JAK2+ thrombocytosis Hematocrit39.8. Hemoglobin13.0.  MCV92.8.  Platelets222,000. FFM38,466 today. She has an underlying myeloproliferative disorder (essential thrombocythemia). Original bone marrowreportfrom 2001is unavailable. Patient was previously on hydroxyurea.  Platelet goal is < 400,000.  Patient continues to be monitored off hydroxyurea. 5. Monoclonalgammopathy of unknown significance(MGUS) M-spikewas 0.2 g/dL on 06/06/2019 and 0 on 12/11/2019. Bone survey on 07/27/2019 revealed a few scattered stable lucencies were in the calvarium.                         Stable T8 and L1 compression fractures.                          Right total shoulder arthroplasty with remote fracture below the prosthesis (11/2018). Continue yearly surveillance bone survey. 6.   Daughter to call re: endocrine therapy. 7.   RTC in 3 month for MD assessment and labs (CBC with diff, CMP, CA27.29).  I discussed the assessment and treatment plan with the patient.  The patient was provided an opportunity to ask questions and all were answered.  The patient agreed with the plan and demonstrated an understanding of the instructions.  The patient was advised to call back if the symptoms worsen or if the condition fails to improve as anticipated.  I provided 21 minutes of face-to-face time during this this encounter and > 50% was spent counseling as documented under my assessment and plan.  An additional 10 minutes were spent reviewing her chart (Epic and Care Everywhere) including notes, labs, and imaging studies.    Lequita Asal, MD, PhD    04/24/2020, 10:57 AM  I, Mirian Mo Tufford, am acting as a Education administrator for [employer]. Mike Gip, MD.   I, Melissa C. Mike Gip, MD, have reviewed the above documentation for accuracy and completeness, and I agree with the above.

## 2020-04-24 ENCOUNTER — Other Ambulatory Visit: Payer: Medicare Other

## 2020-04-24 ENCOUNTER — Ambulatory Visit: Payer: Medicare Other | Admitting: Hematology and Oncology

## 2020-04-24 ENCOUNTER — Inpatient Hospital Stay (HOSPITAL_BASED_OUTPATIENT_CLINIC_OR_DEPARTMENT_OTHER): Payer: Medicare Other | Admitting: Hematology and Oncology

## 2020-04-24 ENCOUNTER — Other Ambulatory Visit: Payer: Self-pay

## 2020-04-24 ENCOUNTER — Encounter: Payer: Self-pay | Admitting: Hematology and Oncology

## 2020-04-24 ENCOUNTER — Inpatient Hospital Stay: Payer: Medicare Other | Attending: Hematology and Oncology

## 2020-04-24 VITALS — BP 116/77 | HR 82 | Temp 97.2°F | Resp 18 | Wt 181.1 lb

## 2020-04-24 DIAGNOSIS — Z79899 Other long term (current) drug therapy: Secondary | ICD-10-CM | POA: Insufficient documentation

## 2020-04-24 DIAGNOSIS — C50811 Malignant neoplasm of overlapping sites of right female breast: Secondary | ICD-10-CM | POA: Diagnosis not present

## 2020-04-24 DIAGNOSIS — M85851 Other specified disorders of bone density and structure, right thigh: Secondary | ICD-10-CM | POA: Diagnosis not present

## 2020-04-24 DIAGNOSIS — Z9011 Acquired absence of right breast and nipple: Secondary | ICD-10-CM | POA: Diagnosis not present

## 2020-04-24 DIAGNOSIS — D472 Monoclonal gammopathy: Secondary | ICD-10-CM | POA: Diagnosis not present

## 2020-04-24 DIAGNOSIS — Z170 Estrogen receptor positive status [ER+]: Secondary | ICD-10-CM | POA: Diagnosis not present

## 2020-04-24 DIAGNOSIS — Z7901 Long term (current) use of anticoagulants: Secondary | ICD-10-CM | POA: Diagnosis not present

## 2020-04-24 DIAGNOSIS — D473 Essential (hemorrhagic) thrombocythemia: Secondary | ICD-10-CM | POA: Diagnosis not present

## 2020-04-24 DIAGNOSIS — C50911 Malignant neoplasm of unspecified site of right female breast: Secondary | ICD-10-CM | POA: Diagnosis not present

## 2020-04-24 DIAGNOSIS — Z8673 Personal history of transient ischemic attack (TIA), and cerebral infarction without residual deficits: Secondary | ICD-10-CM | POA: Diagnosis not present

## 2020-04-24 DIAGNOSIS — D75839 Thrombocytosis, unspecified: Secondary | ICD-10-CM

## 2020-04-24 DIAGNOSIS — Z923 Personal history of irradiation: Secondary | ICD-10-CM | POA: Diagnosis not present

## 2020-04-24 DIAGNOSIS — N183 Chronic kidney disease, stage 3 unspecified: Secondary | ICD-10-CM | POA: Insufficient documentation

## 2020-04-24 LAB — COMPREHENSIVE METABOLIC PANEL
ALT: 22 U/L (ref 0–44)
AST: 30 U/L (ref 15–41)
Albumin: 3.6 g/dL (ref 3.5–5.0)
Alkaline Phosphatase: 87 U/L (ref 38–126)
Anion gap: 11 (ref 5–15)
BUN: 29 mg/dL — ABNORMAL HIGH (ref 8–23)
CO2: 27 mmol/L (ref 22–32)
Calcium: 9.1 mg/dL (ref 8.9–10.3)
Chloride: 100 mmol/L (ref 98–111)
Creatinine, Ser: 1.29 mg/dL — ABNORMAL HIGH (ref 0.44–1.00)
GFR calc Af Amer: 47 mL/min — ABNORMAL LOW (ref >60)
GFR calc non Af Amer: 41 mL/min — ABNORMAL LOW (ref >60)
Glucose, Bld: 108 mg/dL — ABNORMAL HIGH (ref 70–99)
Potassium: 3.6 mmol/L (ref 3.5–5.1)
Sodium: 138 mmol/L (ref 135–145)
Total Bilirubin: 0.7 mg/dL (ref 0.3–1.2)
Total Protein: 7.1 g/dL (ref 6.5–8.1)

## 2020-04-24 LAB — CBC WITH DIFFERENTIAL/PLATELET
Abs Immature Granulocytes: 0.06 10*3/uL (ref 0.00–0.07)
Basophils Absolute: 0.1 10*3/uL (ref 0.0–0.1)
Basophils Relative: 1 %
Eosinophils Absolute: 0.1 10*3/uL (ref 0.0–0.5)
Eosinophils Relative: 1 %
HCT: 39.8 % (ref 36.0–46.0)
Hemoglobin: 13 g/dL (ref 12.0–15.0)
Immature Granulocytes: 1 %
Lymphocytes Relative: 10 %
Lymphs Abs: 1.1 10*3/uL (ref 0.7–4.0)
MCH: 30.3 pg (ref 26.0–34.0)
MCHC: 32.7 g/dL (ref 30.0–36.0)
MCV: 92.8 fL (ref 80.0–100.0)
Monocytes Absolute: 1 10*3/uL (ref 0.1–1.0)
Monocytes Relative: 9 %
Neutro Abs: 9.1 10*3/uL — ABNORMAL HIGH (ref 1.7–7.7)
Neutrophils Relative %: 78 %
Platelets: 222 10*3/uL (ref 150–400)
RBC: 4.29 MIL/uL (ref 3.87–5.11)
RDW: 14.5 % (ref 11.5–15.5)
WBC: 11.5 10*3/uL — ABNORMAL HIGH (ref 4.0–10.5)
nRBC: 0 % (ref 0.0–0.2)

## 2020-04-24 NOTE — Progress Notes (Signed)
Patient here for oncology follow-up appointment, expresses no complaints or concerns at this time.  Unable to answer questions about current medication, will attempt to call daughter.

## 2020-04-25 LAB — CANCER ANTIGEN 27.29: CA 27.29: 19.3 U/mL (ref 0.0–38.6)

## 2020-05-28 ENCOUNTER — Other Ambulatory Visit: Payer: Self-pay | Admitting: General Surgery

## 2020-05-28 DIAGNOSIS — Z1211 Encounter for screening for malignant neoplasm of colon: Secondary | ICD-10-CM

## 2020-06-06 ENCOUNTER — Other Ambulatory Visit: Payer: Self-pay

## 2020-06-06 NOTE — Patient Outreach (Signed)
[facility]) Care Management  06/06/2020  Adriana Martin Mar 06, 1946 [id number]   Telephone outreach to patient to obtain mRS was successfully completed. MRS=3  [place] Management Assistant [phone]

## 2020-06-11 ENCOUNTER — Other Ambulatory Visit: Payer: Self-pay | Admitting: Hematology and Oncology

## 2020-06-13 DIAGNOSIS — D6869 Other thrombophilia: Secondary | ICD-10-CM | POA: Insufficient documentation

## 2020-06-27 ENCOUNTER — Ambulatory Visit
Admission: RE | Admit: 2020-06-27 | Discharge: 2020-06-27 | Disposition: A | Discharge status: Home / Self Care | Payer: Medicare Other | Source: Ambulatory Visit | Attending: General Surgery | Admitting: General Surgery

## 2020-06-27 ENCOUNTER — Other Ambulatory Visit: Payer: Self-pay

## 2020-06-27 DIAGNOSIS — Z1231 Encounter for screening mammogram for malignant neoplasm of breast: Secondary | ICD-10-CM | POA: Insufficient documentation

## 2020-06-27 DIAGNOSIS — Z1211 Encounter for screening for malignant neoplasm of colon: Secondary | ICD-10-CM | POA: Diagnosis not present

## 2020-07-10 ENCOUNTER — Other Ambulatory Visit: Payer: Self-pay | Admitting: Gastroenterology

## 2020-07-10 DIAGNOSIS — K743 Primary biliary cirrhosis: Secondary | ICD-10-CM

## 2020-07-16 ENCOUNTER — Ambulatory Visit
Admission: RE | Admit: 2020-07-16 | Discharge: 2020-07-16 | Disposition: A | Discharge status: Home / Self Care | Payer: Medicare Other | Source: Ambulatory Visit | Attending: Gastroenterology | Admitting: Gastroenterology

## 2020-07-16 ENCOUNTER — Other Ambulatory Visit: Payer: Self-pay

## 2020-07-16 DIAGNOSIS — K743 Primary biliary cirrhosis: Secondary | ICD-10-CM | POA: Diagnosis not present

## 2020-07-24 NOTE — Progress Notes (Signed)
[facility]  [address] [name], [place] [address] Phone: [phone]  Fax: [phone]   Clinic Day:  07/25/2020  Referring physician: [name], MD  Chief Complaint: Adriana Martin is a 74 y.o. female with recurrent right breast cancer, JAK2+thrombocytosis,and amonoclonal gammopathyof unknown significance (MGUS) who is seen for 3 month assessment.   HPI: The patient was last seen in the medical oncology clinic on 04/24/2020. At that time, she felt well.  She had been on Eliquis since 03/15/2020.  She denied any shortness of breath or chest pain.  She denied any breast concerns.  Exam was stable. Hematocrit was 39.8, hemoglobin 13.0, platelets 222,000, WBC 11,500 (ANC 9,100).Creatinine was 1.29 (CrCl 41 ml/min). CA27.29 was 19.3. She continued calcium and vitamin D. We discussed the risk of thrombosis on Tamoxifen; patient wanted to continue tamoxifen.  The patient was seen by Chipper Herb, NP in the [facility] on 06/06/2020 for an initial consult after her stroke. She was to continue Eliquis 5 mg BID.  She declined speech therapy.  Exercises were recommended for her imbalance.  Tremors had resolved.  She has follow-up in 6 months.  Left screening mammogram on 06/27/2020 revealed no evidence of malignancy.  The patient saw Dr. Bary Castilla on 07/04/2020. She had no new breast concerns. There was no evidence of recurrent malignancy. She had recovered well from her CVA. Follow up planned for 1 year after mammogram.  The patient saw Stephens November, NP on 07/10/2020. She reported weakness and was ambulating with a cane. She continued ursodiol 600 mg BID and prednisone 5 mg daily. Abdominal ultrasound revealed the liver had a somewhat coarsened and increased echotexture c/w underlying parenchymal liver disease. There were no focal liver lesions. Portions of the pancreas were obscured by gas. The visualized portions of pancreas appeared  normal. There was a small cyst in each kidney. There was aortic atherosclerosis. Follow up was planned for 6 months.  During the interim, she has been "ok". She is still taking Eliquis. She denies shortness of breath, chest pain, and palpitations. The range of motion in her right arm is limited due to a previous fracture. She denies bleeding of any kind.  The patient denies any breast concerns. She has no problems with tamoxifen. She takes calcium and vitamin D.  She is able to perform her ADLs independently.   Past Medical History:  Diagnosis Date  . Anemia   . Anxiety   . Atrial fibrillation ([place])   . B12 deficiency   . Breast cancer ([place])    RT Lumpectomy c radiation 2012  . Breast cancer ([place])   . CHF (congestive heart failure) ([place])   . Chronic kidney disease   . Cirrhosis ([place])   . COPD (chronic obstructive pulmonary disease) ([place])   . Depression   . Family history of breast cancer   . Family history of esophageal cancer   . Family history of multiple myeloma   . GERD (gastroesophageal reflux disease)   . Headache   . Heart murmur   . Hyperlipemia   . Hyperlipidemia   . Hypertension   . Hypothyroidism   . Iron (Fe) deficiency anemia   . Personal history of radiation therapy   . Presence of permanent cardiac pacemaker   . Shortness of breath dyspnea   . Thrombocytopenia ([place])   . Vitamin D deficiency     Past Surgical History:  Procedure Laterality Date  . ABDOMINAL HYSTERECTOMY     Partial  .  ABLATION    . APPENDECTOMY    . BICEPT TENODESIS Right 01/07/2018   Procedure: BICEPS TENODESIS;  Surgeon: Leim Fabry, MD;  Location: [facility] OR[facility];  Service: Orthopedics;  Laterality: Right;  . BREAST BIOPSY Right 2012  . BREAST BIOPSY Right 07/21/2019   venus clip, [place] Bx,positive  . BREAST LUMPECTOMY Right 2012   positive/rad  . BREAST SURGERY    . CARDIAC SURGERY    . CARDIAC VALVE REPLACEMENT    . COLONOSCOPY  2012  . ELECTROPHYSIOLOGIC STUDY N/A 01/23/2015    Procedure: CARDIOVERSION;  Surgeon: Corey Skains, MD;  Location: [facility] OR[facility];  Service: Cardiovascular;  Laterality: N/A;  . ELECTROPHYSIOLOGIC STUDY N/A 05/27/2016   Procedure: CARDIOVERSION;  Surgeon: Corey Skains, MD;  Location: [facility] OR[facility];  Service: Cardiovascular;  Laterality: N/A;  . ESOPHAGOGASTRODUODENOSCOPY  2012  . EYE SURGERY    . IR CT HEAD LTD  03/10/2020  . IR PERCUTANEOUS ART THROMBECTOMY/INFUSION INTRACRANIAL INC DIAG ANGIO  03/10/2020  . IR US GUIDE VASC ACCESS RIGHT  03/10/2020  . MASTECTOMY Right   . MASTECTOMY WITH AXILLARY LYMPH NODE DISSECTION Right 08/09/2019   Procedure: MASTECTOMY WITH AXILLARY LYMPH NODE DISSECTION;  Surgeon: Robert Bellow, MD;  Location: [facility] ORS;  Service: General;  Laterality: Right;  . MITRAL VALVE REPLACEMENT    . mvp    . RADIOLOGY WITH ANESTHESIA N/A 03/10/2020   Procedure: IR WITH ANESTHESIA;  Surgeon: Radiologist, Medication, MD;  Location: [place];  Service: Radiology;  Laterality: N/A;  . REVERSE SHOULDER ARTHROPLASTY Right 01/07/2018   Procedure: REVERSE SHOULDER ARTHROPLASTY;  Surgeon: Leim Fabry, MD;  Location: [facility] OR[facility];  Service: Orthopedics;  Laterality: Right;  Marland Kitchen VSD REPAIR      Family History  Problem Relation Age of Onset  . Breast cancer Mother        never treated, dx. >50  . Multiple myeloma Father   . Esophageal cancer Brother   . Breast cancer Daughter 82  . Cancer Daughter 90       rare blood cancer    Social History:  reports that she quit smoking about 21 years ago. Her smoking use included cigarettes. She has a 7.50 pack-year smoking history. She has never used smokeless tobacco. She reports that she does not drink alcohol and does not use drugs. She lives in [place]. She currently lives with her daughter because of her stroke in 02/2020. Her nephew, Annie Main, brought her to her appointment today. The patient is alone today.   Allergies:  Allergies  Allergen Reactions  . Atorvastatin Shortness Of Breath  .  Calcium Shortness Of Breath  . Calcium Carbonate     Other reaction(s): Unknown  . Fluoxetine     Other reaction(s): Unknown  . Hydrochlorothiazide     Other reaction(s): Unknown    Current Medications: Current Outpatient Medications  Medication Sig Dispense Refill  . ADVAIR DISKUS 100-50 MCG/DOSE AEPB Inhale 1 puff into the lungs 2 (two) times daily.    Marland Kitchen apixaban (ELIQUIS) 5 MG TABS tablet Take 1 tablet (5 mg total) by mouth 2 (two) times daily. 60 tablet 1  . diltiazem (CARDIZEM CD) 180 MG 24 hr capsule Take 180 mg by mouth daily.    Marland Kitchen ezetimibe (ZETIA) 10 MG tablet Take 10 mg by mouth daily.    . ferrous sulfate 325 (65 FE) MG tablet Take 325 mg by mouth daily with breakfast.    . furosemide (LASIX) 20 MG tablet Take 20 mg by mouth daily.  1  . levothyroxine (SYNTHROID, LEVOTHROID) 50 MCG tablet Take 50 mcg by mouth daily before breakfast.    . lisinopril (PRINIVIL,ZESTRIL) 5 MG tablet Take 5 mg by mouth daily.   98  . potassium chloride (K-DUR,KLOR-CON) 10 MEQ tablet Take 20 mEq by mouth 2 (two) times daily.     . pravastatin (PRAVACHOL) 20 MG tablet Take 20 mg by mouth at bedtime.    . predniSONE (DELTASONE) 5 MG tablet Take 5 mg by mouth daily with breakfast.    . tamoxifen (NOLVADEX) 20 MG tablet TAKE ONE (1) TABLET BY MOUTH ONCE DAILY 90 tablet 0  . ursodiol (ACTIGALL) 300 MG capsule Take 300 mg by mouth 2 (two) times daily.     Marland Kitchen venlafaxine XR (EFFEXOR-XR) 37.5 MG 24 hr capsule Take 37.5 mg by mouth daily.    . Vitamin D, Ergocalciferol, (DRISDOL) 50000 units CAPS capsule Take 50,000 Units by mouth every 14 (fourteen) days.     . mometasone-formoterol (DULERA) 100-5 MCG/ACT AERO Inhale 2 puffs into the lungs 2 (two) times daily. (Patient not taking: Reported on 07/25/2020) 8.8 g 1  . Multiple Vitamins-Minerals (PRESERVISION AREDS 2 PO) Take 2 capsules by mouth daily. (Patient not taking: Reported on 07/25/2020)     No current facility-administered medications for this visit.    Review of Systems  Constitutional: Negative.  Negative for chills, diaphoresis, fever, malaise/fatigue and weight loss (up 2 lbs).  HENT: Negative.  Negative for congestion, ear discharge, ear pain, hearing loss, nosebleeds, sinus pain, sore throat and tinnitus.   Eyes: Negative.  Negative for blurred vision and double vision.  Respiratory: Negative.  Negative for cough, hemoptysis, sputum production and shortness of breath.   Cardiovascular: Negative.  Negative for chest pain, palpitations and leg swelling.  Gastrointestinal: Negative.  Negative for abdominal pain, blood in stool, constipation, diarrhea, heartburn, melena, nausea and vomiting.  Genitourinary: Negative for dysuria, frequency, hematuria and urgency.       Chronic kidney disease.  Musculoskeletal: Negative for back pain, joint pain, myalgias and neck pain.       Right humoral shaft fracture on 12/05/2018. Limited range of motion.  Skin: Negative.  Negative for itching and rash.  Neurological: Negative for dizziness, tingling, sensory change, focal weakness, weakness and headaches.       S/p stroke in 02/2020. Sometimes struggles to get her thoughts out.  Endo/Heme/Allergies: Negative.  Does not bruise/bleed easily.  Psychiatric/Behavioral: Negative.  Negative for depression and memory loss. The patient is not nervous/anxious and does not have insomnia.   All other systems reviewed and are negative.  Performance status (ECOG): 2  Vitals Blood pressure (!) 145/80, pulse 86, temperature (!) 96.7 F (35.9 C), temperature source Tympanic, resp. rate 18, weight 183 lb 8.5 oz (83.3 kg), SpO2 94 %.   Physical Exam Vitals and nursing note reviewed.  Constitutional:      General: She is not in acute distress.    Appearance: Normal appearance. She is well-developed. She is not diaphoretic.     Interventions: Face mask in place.     Comments: Patient sitting comfortably in wheelchair in no acute distress. Examined in wheelchair.   HENT:     Head: Normocephalic and atraumatic.     Comments: Cap.  Short gray hair.    Mouth/Throat:     Mouth: Mucous membranes are moist. No oral lesions.     Pharynx: Oropharynx is clear.  Eyes:     General: No scleral icterus.    Extraocular Movements:  Extraocular movements intact.     Conjunctiva/sclera: Conjunctivae normal.     Pupils: Pupils are equal, round, and reactive to light.     Comments: Glasses.  Blue eyes.  Cardiovascular:     Rate and Rhythm: Normal rate and regular rhythm.     Heart sounds: Normal heart sounds. No murmur heard.  No friction rub. No gallop.      Comments: Left upper chest pacemaker. Bioprosthetic mitral valve. Pulmonary:     Effort: Pulmonary effort is normal.     Breath sounds: Normal breath sounds. No wheezing, rhonchi or rales.  Chest:     Breasts:        Right: Skin change (scarring s/p radiation) present. No tenderness.        Left: Normal. No swelling, bleeding, mass, nipple discharge, skin change or tenderness.  Abdominal:     General: Bowel sounds are normal.     Palpations: Abdomen is soft. There is no hepatomegaly, splenomegaly or mass.     Tenderness: There is no abdominal tenderness.  Musculoskeletal:        General: No swelling or tenderness.     Cervical back: Normal range of motion and neck supple.     Right lower leg: No edema.     Left lower leg: No edema.     Comments: Limited range of motion in right arm s/p fracture.  Lymphadenopathy:     Head:     Right side of head: No preauricular, posterior auricular or occipital adenopathy.     Left side of head: No preauricular, posterior auricular or occipital adenopathy.     Cervical: No cervical adenopathy.     Right cervical: No superficial cervical adenopathy.    Upper Body:     Right upper body: No supraclavicular or axillary adenopathy.     Left upper body: No supraclavicular or axillary adenopathy.     Lower Body: No right inguinal adenopathy. No left inguinal adenopathy.   Skin:    General: Skin is warm and dry.     Findings: No bruising, erythema, lesion or rash.  Neurological:     Mental Status: She is alert and oriented to person, place, and time.  Psychiatric:        Behavior: Behavior normal.        Thought Content: Thought content normal.        Judgment: Judgment normal.    Appointment on 07/25/2020  Component Date Value Ref Range Status  . Sodium 07/25/2020 135  135 - 145 mmol/L Final  . Potassium 07/25/2020 3.8  3.5 - 5.1 mmol/L Final  . Chloride 07/25/2020 96* 98 - 111 mmol/L Final  . CO2 07/25/2020 25  22 - 32 mmol/L Final  . Glucose, Bld 07/25/2020 101* 70 - 99 mg/dL Final   Glucose reference range applies only to samples taken after fasting for at least 8 hours.  . BUN 07/25/2020 20  8 - 23 mg/dL Final  . Creatinine, Ser 07/25/2020 1.17* 0.44 - 1.00 mg/dL Final  . Calcium 07/25/2020 9.1  8.9 - 10.3 mg/dL Final  . Total Protein 07/25/2020 7.2  6.5 - 8.1 g/dL Final  . Albumin 07/25/2020 3.6  3.5 - 5.0 g/dL Final  . AST 07/25/2020 33  15 - 41 U/L Final  . ALT 07/25/2020 26  0 - 44 U/L Final  . Alkaline Phosphatase 07/25/2020 70  38 - 126 U/L Final  . Total Bilirubin 07/25/2020 0.7  0.3 - 1.2 mg/dL Final  . GFR,  Estimated 07/25/2020 49* >60 mL/min Final   Comment: (NOTE) Calculated using the CKD-EPI Creatinine Equation (2021)   . Anion gap 07/25/2020 14  5 - 15 Final   Performed at [facility], [address], [place], [place] [address]  . WBC 07/25/2020 13.3* 4.0 - 10.5 K/uL Final  . RBC 07/25/2020 4.05  3.87 - 5.11 MIL/uL Final  . Hemoglobin 07/25/2020 12.4  12.0 - 15.0 g/dL Final  . HCT 07/25/2020 37.1  36 - 46 % Final  . MCV 07/25/2020 91.6  80.0 - 100.0 fL Final  . MCH 07/25/2020 30.6  26.0 - 34.0 pg Final  . MCHC 07/25/2020 33.4  30.0 - 36.0 g/dL Final  . RDW 07/25/2020 13.6  11.5 - 15.5 % Final  . Platelets 07/25/2020 232  150 - 400 K/uL Final  . nRBC 07/25/2020 0.0  0.0 - 0.2 % Final  . Neutrophils Relative %  07/25/2020 81  % Final  . Neutro Abs 07/25/2020 11.0* 1.7 - 7.7 K/uL Final  . Lymphocytes Relative 07/25/2020 8  % Final  . Lymphs Abs 07/25/2020 1.0  0.7 - 4.0 K/uL Final  . Monocytes Relative 07/25/2020 8  % Final  . Monocytes Absolute 07/25/2020 1.0  0.1 - 1.0 K/uL Final  . Eosinophils Relative 07/25/2020 1  % Final  . Eosinophils Absolute 07/25/2020 0.2  0.0 - 0.5 K/uL Final  . Basophils Relative 07/25/2020 1  % Final  . Basophils Absolute 07/25/2020 0.1  0.0 - 0.1 K/uL Final  . Immature Granulocytes 07/25/2020 1  % Final  . Abs Immature Granulocytes 07/25/2020 0.07  0.00 - 0.07 K/uL Final   Performed at [facility], [address] [place], [place] [address]    Assessment:  Adriana Martin is a 74 y.o. female withrecurrent right breast cancer, JAK2+thrombocytosis, and amonoclonal gammopathyof unknown significance (MGUS).  She has a history of stageIrightbreast cancers/p lumpectomy and SLN biopsy on 02/18/2011.Pathologyrevealed a 1.1 cm grade IIductal carcinoma tumor. Tumor was ER+ (>90%),PR+ (>90%),andHer2/neunegative by FISH.Pathologic stagewas T1c pN0 (sn). She received accelerated partial breast irradiation(2012). She completedArimidexin 08/2016.   Right unilateral breast mammogram and ultrasoundon 07/12/2019 revealed a suspicious 2.2 x 0.7 x 1.5 cm irregular hypoechoicmass in the right breast at 10 o'clock, with at least 3 possible satellite lesions(each measuring 3-4 mm)extending inferior and medial. There was no right axillary lymphadenopathy.  Ultrasound guidedright breast core needle biopsyon 07/21/2019 revealed a 12 mm grade II invasive mammary carcinomawith lobular features.Tumor is ER + (> 90%), PR + (> 90%), and Her2/neu 1+.  She underwent a right mastectomy and SLN biopsy on 08/09/2019.  Pathology revealed a 4.3 x 2.1 x 2.0 cm grade II invasive lobular carcinoma. There was lobular carcinoma in situ focally present.   Margins were uninvolved by invasive carcinoma.  Closest margin was < 0.5 mm from the inferior and 1 mm from the deep margin.  There was 1 of 10 lymph nodes with metastatic carcinoma.  The largest metastatic deposit was 2 mm.  Tumor was ER was positive (>90%), PR positive (>90%), and Her2/neu 1+.   Pathologic stage was mpT2 p N1a.  Mammaprint revealed a low risk luminal-type (A).  From the MINDACT trial if lymph node negative, ER+ and Her2/neu - there was a 97.8% chance of living without distant recurrence of breast cancer at 5 years (DMFI).  She received a 3 week hypofractionated course of right breast radiation (02/15/2020 - 03/08/2020).  She began tamoxifen on x/x/2021.  Left screening mammogram  on 06/27/2020 revealed no evidence of malignancy.  CA27.29 has been followed: 26.5 on 04/15/2011, 31.3 on 12/11/2019, 19.3 on 04/24/2020, and 22.5 on 07/25/2020.   She has hadchronicthrombocytosissince2001.JAK2 V617Fwas positive on 12/14/2017. Platelet countranged between 400,000 - 500,000. She had a bone marrowin 2001 (no report available). She was previously onhydroxyurea(discontinued in 05/2016).    She has a monoclonal gammopathyof unknown significance (MGUS).SPEPon 03/26/2019revealed a0.3 gm/dL IgG monoclonal proteinwith lambda light chain specificity. Bone surveyon 12/14/2017 revealed scattered lytic lesions throughout the calvarium concerning for metastases or myeloma.There was compression fractures at T8 and L1(new since 2017).  Bone survey on 07/27/2019 showed a few scattered lucencies were noted in the calvarium, unchanged. There were no lytic myelomatous lesions identified in the remainder of the axial or appendicular skeleton. There was stable T8 and L1 compression fractures. There was a right total shoulder arthroplasty with remote appearing fracture below the prosthesis (2020).  M-spikehas been followed (gm/dL): 0.3 on03/26/2019, 0.2 on 06/07/2018,and 0.2 on  06/06/2019.   Shehas stageIIIchronic kidney disease.Creatinineis 1.27 (CrCl 38.4 ml/min) on03/22/2021.  She hasa history ofiron deficiency anemia.Last colonoscopywasin 2012(no report available).Ferritinhas been followed: 66 on06/05/2014, 82 on 07/02/2015, 385 on09/26/2017 and 609 on06/19/2018. Iron saturation was 17% on 03/09/2017. She is not on oral iron.  Bone density on 12/18/2019 revealed osteopenia with a T-score of -2.1 in the right femoral neck.   The patient was seen in the [facility] ER and admitted to [facility] from 03/10/2020 - 03/15/2020 with an acute ischemic stroke.  She received TPA as well as thrombectomy. Head CT on 03/11/2020 revealed an evolving acute left MCA territory infarct. She was discharged to [facility] on Eliquis.  The patient received the [place] COVID-19 vaccine on 11/30/2019 and 12/26/2019.  Symptomatically, she feels "ok". She is taking Eliquis. She denies shortness of breath, chest pain, and palpitations.  She denies bleeding of any kind. She denies any breast concerns.  Plan: 1.   Labs today:  CBC with diff, CMP, CA27.29, SPEP. 2.   Recurrent right breast cancer(2020) She initially had right breast cancer in 2012.                         Sheunderwentlumpectomy and sentinel lymph node biopsy              She receivedaccelerated partial breast radiation and 5 years of Arimidex.             Right unilateral breast mammogram on 07/12/2019 revealed a suspicious 2.2 x 0.7 x 1.5 cm irregular hypoechoicmass.              Ultrasound guidedright breast core needle biopsyon 07/21/2019 revealed invasive mammary carcinomawith lobular features.            She underwent right mastectomy and SLN biopsy on 08/09/2019.               Pathology revealed a 4.3 cm grade II invasive lobular carcinoma.                             One of 10 lymph nodes revealed metastatic carcinoma.                           Tumor  was ER positive (>90%), PR positive (>90%), and Her2/neu 1+.  Pathologic stage was mpT2 p N1a.             Mammaprint revealed a low risk luminal-type (A).   She received a 3 week hypofractionated course of right breast radiation (02/15/2020 - 03/08/2020).              Symptomatically, she is doing well on tamoxifen.   Patient wishes to continue tamoxifen (rather than switch to an AI).  Exam reveals no evidence of recurrent disease.  CA27.29 is normal.  Continue to monitor.       3. Osteopenia  Bone density on 12/18/2019 confirmed osteopenia with a T-score of -2.1 in the right femoral neck.   Continue calcium and vitamin D. 4.   Essential thrombocytosis She has an underlying myeloproliferative disorder (JAK2+).  Hematocrit37.1. Hemoglobin12.4. MCV91.6. Platelets232,000. [id number] today. She has an underlying myeloproliferative disorder (essential thrombocythemia). Original bone marrowreportfrom 2001is unavailable. Patient was previously on hydroxyurea.  Platelet goal is < 400,000.  Continue to monitor off hydroxyurea. 5. Monoclonalgammopathy of unknown significance(MGUS) M-spikewas 0.2 g/dL on 06/06/2019 and 0 on 12/11/2019. Bone survey on 07/27/2019 revealed a few scattered stable lucencies were in the calvarium.                         Stable T8 and L1 compression fractures.                          Right total shoulder arthroplasty with remote fracture below the prosthesis (11/2018). Check SPEP at next blood draw. 6.   RTC in 4 month for MD assessment and labs (CBC with diff, CMP, CA27.29, SPEP).  I discussed the assessment and treatment plan with the patient.  The patient was provided an opportunity to ask questions and all were answered.  The patient agreed with the plan and demonstrated an understanding of the instructions.  The patient was  advised to call back if the symptoms worsen or if the condition fails to improve as anticipated.   Lequita Asal, MD, PhD    07/25/2020, 10:53 AM  I, Mirian Mo Tufford, am acting as a Education administrator for [employer]. Mike Gip, MD.   I, Ellawyn Wogan C. Mike Gip, MD, have reviewed the above documentation for accuracy and completeness, and I agree with the above. Marland Kitchen

## 2020-07-25 ENCOUNTER — Other Ambulatory Visit: Payer: Self-pay

## 2020-07-25 ENCOUNTER — Inpatient Hospital Stay: Payer: Medicare Other | Attending: Hematology and Oncology

## 2020-07-25 ENCOUNTER — Other Ambulatory Visit: Payer: Self-pay | Admitting: Hematology and Oncology

## 2020-07-25 ENCOUNTER — Inpatient Hospital Stay: Payer: Medicare Other | Admitting: Hematology and Oncology

## 2020-07-25 ENCOUNTER — Encounter: Payer: Self-pay | Admitting: Hematology and Oncology

## 2020-07-25 VITALS — BP 145/80 | HR 86 | Temp 96.7°F | Resp 18 | Wt 183.5 lb

## 2020-07-25 DIAGNOSIS — M85851 Other specified disorders of bone density and structure, right thigh: Secondary | ICD-10-CM | POA: Diagnosis not present

## 2020-07-25 DIAGNOSIS — D472 Monoclonal gammopathy: Secondary | ICD-10-CM | POA: Insufficient documentation

## 2020-07-25 DIAGNOSIS — Z9011 Acquired absence of right breast and nipple: Secondary | ICD-10-CM | POA: Insufficient documentation

## 2020-07-25 DIAGNOSIS — Z7901 Long term (current) use of anticoagulants: Secondary | ICD-10-CM | POA: Insufficient documentation

## 2020-07-25 DIAGNOSIS — E785 Hyperlipidemia, unspecified: Secondary | ICD-10-CM | POA: Insufficient documentation

## 2020-07-25 DIAGNOSIS — C50811 Malignant neoplasm of overlapping sites of right female breast: Secondary | ICD-10-CM

## 2020-07-25 DIAGNOSIS — Z170 Estrogen receptor positive status [ER+]: Secondary | ICD-10-CM | POA: Diagnosis not present

## 2020-07-25 DIAGNOSIS — Z7952 Long term (current) use of systemic steroids: Secondary | ICD-10-CM | POA: Insufficient documentation

## 2020-07-25 DIAGNOSIS — I509 Heart failure, unspecified: Secondary | ICD-10-CM | POA: Diagnosis not present

## 2020-07-25 DIAGNOSIS — E039 Hypothyroidism, unspecified: Secondary | ICD-10-CM | POA: Insufficient documentation

## 2020-07-25 DIAGNOSIS — E559 Vitamin D deficiency, unspecified: Secondary | ICD-10-CM | POA: Insufficient documentation

## 2020-07-25 DIAGNOSIS — I4891 Unspecified atrial fibrillation: Secondary | ICD-10-CM | POA: Insufficient documentation

## 2020-07-25 DIAGNOSIS — Z950 Presence of cardiac pacemaker: Secondary | ICD-10-CM | POA: Insufficient documentation

## 2020-07-25 DIAGNOSIS — D473 Essential (hemorrhagic) thrombocythemia: Secondary | ICD-10-CM

## 2020-07-25 DIAGNOSIS — J449 Chronic obstructive pulmonary disease, unspecified: Secondary | ICD-10-CM | POA: Insufficient documentation

## 2020-07-25 DIAGNOSIS — D75839 Thrombocytosis, unspecified: Secondary | ICD-10-CM | POA: Insufficient documentation

## 2020-07-25 DIAGNOSIS — C50911 Malignant neoplasm of unspecified site of right female breast: Secondary | ICD-10-CM | POA: Insufficient documentation

## 2020-07-25 DIAGNOSIS — Z87891 Personal history of nicotine dependence: Secondary | ICD-10-CM | POA: Insufficient documentation

## 2020-07-25 DIAGNOSIS — N189 Chronic kidney disease, unspecified: Secondary | ICD-10-CM | POA: Diagnosis not present

## 2020-07-25 DIAGNOSIS — Z8673 Personal history of transient ischemic attack (TIA), and cerebral infarction without residual deficits: Secondary | ICD-10-CM | POA: Insufficient documentation

## 2020-07-25 DIAGNOSIS — I10 Essential (primary) hypertension: Secondary | ICD-10-CM | POA: Insufficient documentation

## 2020-07-25 DIAGNOSIS — Z79899 Other long term (current) drug therapy: Secondary | ICD-10-CM | POA: Insufficient documentation

## 2020-07-25 DIAGNOSIS — Z923 Personal history of irradiation: Secondary | ICD-10-CM | POA: Diagnosis not present

## 2020-07-25 DIAGNOSIS — Z79810 Long term (current) use of selective estrogen receptor modulators (SERMs): Secondary | ICD-10-CM | POA: Insufficient documentation

## 2020-07-25 LAB — COMPREHENSIVE METABOLIC PANEL
ALT: 26 U/L (ref 0–44)
AST: 33 U/L (ref 15–41)
Albumin: 3.6 g/dL (ref 3.5–5.0)
Alkaline Phosphatase: 70 U/L (ref 38–126)
Anion gap: 14 (ref 5–15)
BUN: 20 mg/dL (ref 8–23)
CO2: 25 mmol/L (ref 22–32)
Calcium: 9.1 mg/dL (ref 8.9–10.3)
Chloride: 96 mmol/L — ABNORMAL LOW (ref 98–111)
Creatinine, Ser: 1.17 mg/dL — ABNORMAL HIGH (ref 0.44–1.00)
GFR, Estimated: 49 mL/min — ABNORMAL LOW (ref >60)
Glucose, Bld: 101 mg/dL — ABNORMAL HIGH (ref 70–99)
Potassium: 3.8 mmol/L (ref 3.5–5.1)
Sodium: 135 mmol/L (ref 135–145)
Total Bilirubin: 0.7 mg/dL (ref 0.3–1.2)
Total Protein: 7.2 g/dL (ref 6.5–8.1)

## 2020-07-25 LAB — CBC WITH DIFFERENTIAL/PLATELET
Abs Immature Granulocytes: 0.07 10*3/uL (ref 0.00–0.07)
Basophils Absolute: 0.1 10*3/uL (ref 0.0–0.1)
Basophils Relative: 1 %
Eosinophils Absolute: 0.2 10*3/uL (ref 0.0–0.5)
Eosinophils Relative: 1 %
HCT: 37.1 % (ref 36.0–46.0)
Hemoglobin: 12.4 g/dL (ref 12.0–15.0)
Immature Granulocytes: 1 %
Lymphocytes Relative: 8 %
Lymphs Abs: 1 10*3/uL (ref 0.7–4.0)
MCH: 30.6 pg (ref 26.0–34.0)
MCHC: 33.4 g/dL (ref 30.0–36.0)
MCV: 91.6 fL (ref 80.0–100.0)
Monocytes Absolute: 1 10*3/uL (ref 0.1–1.0)
Monocytes Relative: 8 %
Neutro Abs: 11 10*3/uL — ABNORMAL HIGH (ref 1.7–7.7)
Neutrophils Relative %: 81 %
Platelets: 232 10*3/uL (ref 150–400)
RBC: 4.05 MIL/uL (ref 3.87–5.11)
RDW: 13.6 % (ref 11.5–15.5)
WBC: 13.3 10*3/uL — ABNORMAL HIGH (ref 4.0–10.5)
nRBC: 0 % (ref 0.0–0.2)

## 2020-07-25 NOTE — Progress Notes (Signed)
No new changes noted today 

## 2020-07-26 LAB — CANCER ANTIGEN 27.29: CA 27.29: 22.5 U/mL (ref 0.0–38.6)

## 2020-07-29 LAB — PROTEIN ELECTROPHORESIS, SERUM
A/G Ratio: 1.1 (ref 0.7–1.7)
Albumin ELP: 4 g/dL (ref 2.9–4.4)
Alpha-1-Globulin: 0.5 g/dL — ABNORMAL HIGH (ref 0.0–0.4)
Alpha-2-Globulin: 1 g/dL (ref 0.4–1.0)
Beta Globulin: 1.3 g/dL (ref 0.7–1.3)
Gamma Globulin: 0.9 g/dL (ref 0.4–1.8)
Globulin, Total: 3.7 g/dL (ref 2.2–3.9)
Total Protein ELP: 7.7 g/dL (ref 6.0–8.5)

## 2020-09-18 ENCOUNTER — Other Ambulatory Visit: Payer: Self-pay

## 2020-09-18 ENCOUNTER — Encounter: Payer: Self-pay | Admitting: Radiation Oncology

## 2020-09-18 ENCOUNTER — Ambulatory Visit
Admission: RE | Admit: 2020-09-18 | Discharge: 2020-09-18 | Disposition: A | Discharge status: Home / Self Care | Payer: Medicare Other | Source: Ambulatory Visit | Attending: Radiation Oncology | Admitting: Radiation Oncology

## 2020-09-18 DIAGNOSIS — C50811 Malignant neoplasm of overlapping sites of right female breast: Secondary | ICD-10-CM | POA: Diagnosis present

## 2020-09-18 DIAGNOSIS — Z923 Personal history of irradiation: Secondary | ICD-10-CM | POA: Diagnosis not present

## 2020-09-18 DIAGNOSIS — Z9011 Acquired absence of right breast and nipple: Secondary | ICD-10-CM | POA: Insufficient documentation

## 2020-09-18 DIAGNOSIS — D696 Thrombocytopenia, unspecified: Secondary | ICD-10-CM | POA: Insufficient documentation

## 2020-09-18 DIAGNOSIS — D472 Monoclonal gammopathy: Secondary | ICD-10-CM | POA: Insufficient documentation

## 2020-09-18 DIAGNOSIS — Z170 Estrogen receptor positive status [ER+]: Secondary | ICD-10-CM | POA: Insufficient documentation

## 2020-09-18 NOTE — Progress Notes (Signed)
Radiation Oncology Follow up Note  Name: Adriana Martin   Date:   09/18/2020 MRN:  [id number] DOB: Apr 06, 1946    This 74 y.o. 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  REFERRING PROVIDER: Mickey Farber, MD  HPI: Patient is a 74 year old female now out 6 months having completed whole breast radiation to her right breast and patient received partial breast irradiation 7 years prior for ER/PR positive disease. She underwent a right modified radical mastectomy for 4.3 cm invasive carcinoma with margins clear but close of 0.5 mm. She received right chest wall and peripheral lymphatic radiation. She is doing well and was without complaints specifically denies any new evidence of chest wall mass or nodularity or bone pain or cough.. She had a left mammogram performed back in October which was BI-RADS 1 negative.  COMPLICATIONS OF TREATMENT: none  FOLLOW UP COMPLIANCE: keeps appointments   PHYSICAL EXAM:  BP (P) 128/74 (BP Location: Left Arm, Patient Position: Sitting)   Pulse (P) 86   Temp (!) (P) 96.9 F (36.1 C) (Tympanic)   Resp (P) 16   Wt (P) 177 lb 3.2 oz (80.4 kg)   BMI (P) 32.41 kg/m  Patient status post right modified radical mastectomy chest wall is clear without evidence of mass or nodularity. Left breast is free of dominant mass. No axillary or supraclavicular adenopathy is identified no evidence of lymphedema in her right upper extremity is noted. Well-developed well-nourished patient in NAD. HEENT reveals PERLA, EOMI, discs not visualized.  Oral cavity is clear. No oral mucosal lesions are identified. Neck is clear without evidence of cervical or supraclavicular adenopathy. Lungs are clear to A&P. Cardiac examination is essentially unremarkable with regular rate and rhythm without  murmur rub or thrill. Abdomen is benign with no organomegaly or masses noted. Motor sensory and DTR levels are equal and symmetric in the upper and lower extremities. Cranial nerves II through XII are grossly intact. Proprioception is intact. No peripheral adenopathy or edema is identified. No motor or sensory levels are noted. Crude visual fields are within normal range.  RADIOLOGY RESULTS: Mammogram reviewed compatible with above-stated findings  PLAN: Present time patient is now 6 months out from chest wall and peripheral emphatic radiation to her right chest wall. She continues to do well with no evidence of disease. She is being followed by Dr. Merlene Pulling for multiple medical conditions including thrombocytosis and monoclonal gammopathy of unknown significance. I have asked to see her back in 1 year for follow-up. I made sure she has a follow-up appointment over the next month or 2 with Dr. Merlene Pulling. Patient knows to call with any concerns.  I would like to take this opportunity to thank you for allowing me to participate in the care of your patient.Carmina Miller, MD

## 2020-10-30 DIAGNOSIS — D692 Other nonthrombocytopenic purpura: Secondary | ICD-10-CM | POA: Insufficient documentation

## 2020-10-30 DIAGNOSIS — E2749 Other adrenocortical insufficiency: Secondary | ICD-10-CM | POA: Insufficient documentation

## 2020-11-14 ENCOUNTER — Other Ambulatory Visit: Payer: Self-pay

## 2020-11-20 NOTE — Progress Notes (Signed)
[facility]  [address] [place], [place] [address] Phone: [phone]  Fax: [phone]   Clinic Day:  11/21/2020  Referring physician: [name], MD  Chief Complaint: Adriana Martin is a 75 y.o. female with recurrent right breast cancer, JAK2+thrombocytosis,and amonoclonal gammopathyof unknown significance (MGUS) who is seen for 4 month assessment.   HPI: The patient was last seen in the medical oncology clinic on 07/25/2020. At that time, she feels "ok". She is taking Eliquis. She denies shortness of breath, chest pain, and palpitations.  She denies bleeding of any kind. She denies any breast concerns. Hematocrit was 37.1, hemoglobin 12.4, platelets 232,000, WBC 13,300. Creatinine was 1.17 (CrCl 49 ml/min). M spike was 0. CA27.29 was 22.5. She continued tamoxifen, calcium, and vitamin D.  The patient saw Dr. Baruch Gouty on 09/18/2020. She continued to do well and denied bone pain, cough, and evidence of new chest wall mass or nodularity. Follow-up was planned for 1 year.  During the interim, she has been fine. She is in a wheelchair today. She gets around well at home. She denies shortness of breath, chest pain, palpitations, nausea, vomiting, lumps, and bumps. Her right arm has healed well s/p fracture.  She takes tamoxifen, calcium, and vitamin D. She performs monthly breast self exams.   Past Medical History:  Diagnosis Date  . Anemia   . Anxiety   . Atrial fibrillation ([place])   . B12 deficiency   . Breast cancer ([place])    RT Lumpectomy c radiation 2012  . Breast cancer ([place])   . CHF (congestive heart failure) ([place])   . Chronic kidney disease   . Cirrhosis ([place])   . COPD (chronic obstructive pulmonary disease) ([place])   . Depression   . Family history of breast cancer   . Family history of esophageal cancer   . Family history of multiple myeloma   . GERD (gastroesophageal reflux disease)   . Headache   . Heart murmur   .  Hyperlipemia   . Hyperlipidemia   . Hypertension   . Hypothyroidism   . Iron (Fe) deficiency anemia   . Personal history of radiation therapy   . Presence of permanent cardiac pacemaker   . Shortness of breath dyspnea   . Thrombocytopenia ([place])   . Vitamin D deficiency     Past Surgical History:  Procedure Laterality Date  . ABDOMINAL HYSTERECTOMY     Partial  . ABLATION    . APPENDECTOMY    . BICEPT TENODESIS Right 01/07/2018   Procedure: BICEPS TENODESIS;  Surgeon: Leim Fabry, MD;  Location: [facility] ORS;  Service: Orthopedics;  Laterality: Right;  . BREAST BIOPSY Right 2012  . BREAST BIOPSY Right 07/21/2019   venus clip, [place] Bx,positive  . BREAST LUMPECTOMY Right 2012   positive/rad  . BREAST SURGERY    . CARDIAC SURGERY    . CARDIAC VALVE REPLACEMENT    . COLONOSCOPY  2012  . ELECTROPHYSIOLOGIC STUDY N/A 01/23/2015   Procedure: CARDIOVERSION;  Surgeon: Corey Skains, MD;  Location: [facility] ORS;  Service: Cardiovascular;  Laterality: N/A;  . ELECTROPHYSIOLOGIC STUDY N/A 05/27/2016   Procedure: CARDIOVERSION;  Surgeon: Corey Skains, MD;  Location: [facility] ORS;  Service: Cardiovascular;  Laterality: N/A;  . ESOPHAGOGASTRODUODENOSCOPY  2012  . EYE SURGERY    . IR CT HEAD LTD  03/10/2020  . IR PERCUTANEOUS ART THROMBECTOMY/INFUSION INTRACRANIAL INC DIAG ANGIO  03/10/2020  . IR US GUIDE VASC ACCESS RIGHT  03/10/2020  .  MASTECTOMY Right   . MASTECTOMY WITH AXILLARY LYMPH NODE DISSECTION Right 08/09/2019   Procedure: MASTECTOMY WITH AXILLARY LYMPH NODE DISSECTION;  Surgeon: Robert Bellow, MD;  Location: [facility] ORS;  Service: General;  Laterality: Right;  . MITRAL VALVE REPLACEMENT    . mvp    . RADIOLOGY WITH ANESTHESIA N/A 03/10/2020   Procedure: IR WITH ANESTHESIA;  Surgeon: Radiologist, Medication, MD;  Location: [place];  Service: Radiology;  Laterality: N/A;  . REVERSE SHOULDER ARTHROPLASTY Right 01/07/2018   Procedure: REVERSE SHOULDER ARTHROPLASTY;  Surgeon: Leim Fabry, MD;   Location: [facility] OR[facility];  Service: Orthopedics;  Laterality: Right;  Marland Kitchen VSD REPAIR      Family History  Problem Relation Age of Onset  . Breast cancer Mother        never treated, dx. >50  . Multiple myeloma Father   . Esophageal cancer Brother   . Breast cancer Daughter 30  . Cancer Daughter 30       rare blood cancer    Social History:  reports that she quit smoking about 22 years ago. Her smoking use included cigarettes. She has a 7.50 pack-year smoking history. She has never used smokeless tobacco. She reports that she does not drink alcohol and does not use drugs. She lives in [place]. She currently lives with her daughter because of her stroke in 02/2020. Her nephew, Annie Main, brought her to her appointment today. The patient is alone today.  Allergies:  Allergies  Allergen Reactions  . Atorvastatin Shortness Of Breath  . Calcium Shortness Of Breath  . Calcium Carbonate     Other reaction(s): Unknown  . Fluoxetine     Other reaction(s): Unknown  . Hydrochlorothiazide     Other reaction(s): Unknown    Current Medications: Current Outpatient Medications  Medication Sig Dispense Refill  . ADVAIR DISKUS 100-50 MCG/DOSE AEPB Inhale 1 puff into the lungs 2 (two) times daily.    Marland Kitchen apixaban (ELIQUIS) 5 MG TABS tablet Take 1 tablet (5 mg total) by mouth 2 (two) times daily. 60 tablet 1  . diltiazem (CARDIZEM CD) 180 MG 24 hr capsule Take 180 mg by mouth daily.    Marland Kitchen ezetimibe (ZETIA) 10 MG tablet Take 10 mg by mouth daily.    . furosemide (LASIX) 20 MG tablet Take 20 mg by mouth daily.   1  . levothyroxine (SYNTHROID, LEVOTHROID) 50 MCG tablet Take 50 mcg by mouth daily before breakfast.    . lisinopril (PRINIVIL,ZESTRIL) 5 MG tablet Take 5 mg by mouth daily.   98  . Multiple Vitamin (MULTIVITAMIN) tablet Take 1 tablet by mouth daily.    . potassium chloride (K-DUR,KLOR-CON) 10 MEQ tablet Take 20 mEq by mouth 2 (two) times daily.     . pravastatin (PRAVACHOL) 20 MG tablet Take 20 mg by  mouth at bedtime.    . predniSONE (DELTASONE) 5 MG tablet Take 5 mg by mouth daily with breakfast.    . tamoxifen (NOLVADEX) 20 MG tablet TAKE ONE (1) TABLET BY MOUTH ONCE DAILY 90 tablet 0  . ursodiol (ACTIGALL) 300 MG capsule Take 300 mg by mouth 2 (two) times daily.     Marland Kitchen venlafaxine XR (EFFEXOR-XR) 37.5 MG 24 hr capsule Take 37.5 mg by mouth daily.    . Vitamin D, Ergocalciferol, (DRISDOL) 50000 units CAPS capsule Take 50,000 Units by mouth every 14 (fourteen) days.     . ferrous sulfate 325 (65 FE) MG tablet Take 325 mg by mouth daily with breakfast. (Patient not  taking: Reported on 11/21/2020)    . mometasone-formoterol (DULERA) 100-5 MCG/ACT AERO Inhale 2 puffs into the lungs 2 (two) times daily. (Patient not taking: No sig reported) 8.8 g 1  . Multiple Vitamins-Minerals (PRESERVISION AREDS 2 PO) Take 2 capsules by mouth daily. (Patient not taking: No sig reported)     No current facility-administered medications for this visit.   Review of Systems  Constitutional: Positive for weight loss (9 lbs). Negative for chills, diaphoresis, fever and malaise/fatigue.  HENT: Negative.  Negative for congestion, ear discharge, ear pain, hearing loss, nosebleeds, sinus pain, sore throat and tinnitus.   Eyes: Negative.  Negative for blurred vision and double vision.  Respiratory: Negative.  Negative for cough, hemoptysis, sputum production and shortness of breath.   Cardiovascular: Negative.  Negative for chest pain, palpitations and leg swelling.  Gastrointestinal: Negative.  Negative for abdominal pain, blood in stool, constipation, diarrhea, heartburn, melena, nausea and vomiting.  Genitourinary: Negative for dysuria, frequency, hematuria and urgency.       Chronic kidney disease.  Musculoskeletal: Negative for back pain, joint pain, myalgias and neck pain.  Skin: Negative.  Negative for itching and rash.  Neurological: Negative for dizziness, tingling, sensory change, focal weakness, weakness and  headaches.       S/p stroke in 02/2020. Sometimes struggles to get her thoughts out.  Endo/Heme/Allergies: Negative.  Does not bruise/bleed easily.  Psychiatric/Behavioral: Negative.  Negative for depression and memory loss. The patient is not nervous/anxious and does not have insomnia.   All other systems reviewed and are negative.  Performance status (ECOG): 2  Vitals Blood pressure 135/63, pulse 63, temperature 97.8 F (36.6 C), resp. rate 16, weight 174 lb 3.2 oz (79 kg).   Physical Exam Vitals and nursing note reviewed.  Constitutional:      General: She is not in acute distress.    Appearance: Normal appearance. She is well-developed. She is not diaphoretic.     Interventions: Face mask in place.     Comments: Patient sitting comfortably in wheelchair in no acute distress. Examined in wheelchair.  HENT:     Head: Normocephalic and atraumatic.     Comments: Cap.  Short gray hair.    Mouth/Throat:     Mouth: Mucous membranes are moist. No oral lesions.     Pharynx: Oropharynx is clear.  Eyes:     General: No scleral icterus.    Extraocular Movements: Extraocular movements intact.     Conjunctiva/sclera: Conjunctivae normal.     Pupils: Pupils are equal, round, and reactive to light.     Comments: Glasses.  Blue eyes.  Cardiovascular:     Rate and Rhythm: Normal rate and regular rhythm.     Heart sounds: Normal heart sounds. No murmur heard. No friction rub. No gallop.   Pulmonary:     Effort: Pulmonary effort is normal.     Breath sounds: Normal breath sounds. No wheezing, rhonchi or rales.  Chest:  Breasts:     Right: Skin change (scarring s/p radiation) and tenderness (over scarring) present. No axillary adenopathy or supraclavicular adenopathy.     Left: Normal. No swelling, bleeding, mass, nipple discharge, skin change, tenderness, axillary adenopathy or supraclavicular adenopathy.    Abdominal:     General: Bowel sounds are normal.     Palpations: Abdomen is  soft. There is no hepatomegaly, splenomegaly or mass.     Tenderness: There is no abdominal tenderness.  Musculoskeletal:        General: No swelling  or tenderness. Normal range of motion.     Cervical back: Normal range of motion and neck supple.     Right lower leg: No edema.     Left lower leg: No edema.  Lymphadenopathy:     Head:     Right side of head: No preauricular, posterior auricular or occipital adenopathy.     Left side of head: No preauricular, posterior auricular or occipital adenopathy.     Cervical: No cervical adenopathy.     Right cervical: No superficial cervical adenopathy.    Upper Body:     Right upper body: No supraclavicular or axillary adenopathy.     Left upper body: No supraclavicular or axillary adenopathy.     Lower Body: No right inguinal adenopathy. No left inguinal adenopathy.  Skin:    General: Skin is warm and dry.     Findings: No bruising, erythema, lesion or rash.  Neurological:     Mental Status: She is alert and oriented to person, place, and time.  Psychiatric:        Behavior: Behavior normal.        Thought Content: Thought content normal.        Judgment: Judgment normal.    Appointment on 11/21/2020  Component Date Value Ref Range Status  . Sodium 11/21/2020 136  135 - 145 mmol/L Final  . Potassium 11/21/2020 3.9  3.5 - 5.1 mmol/L Final  . Chloride 11/21/2020 95* 98 - 111 mmol/L Final  . CO2 11/21/2020 28  22 - 32 mmol/L Final  . Glucose, Bld 11/21/2020 94  70 - 99 mg/dL Final   Glucose reference range applies only to samples taken after fasting for at least 8 hours.  . BUN 11/21/2020 25* 8 - 23 mg/dL Final  . Creatinine, Ser 11/21/2020 1.31* 0.44 - 1.00 mg/dL Final  . Calcium 11/21/2020 9.6  8.9 - 10.3 mg/dL Final  . Total Protein 11/21/2020 7.4  6.5 - 8.1 g/dL Final  . Albumin 11/21/2020 3.6  3.5 - 5.0 g/dL Final  . AST 11/21/2020 31  15 - 41 U/L Final  . ALT 11/21/2020 22  0 - 44 U/L Final  . Alkaline Phosphatase 11/21/2020 58   38 - 126 U/L Final  . Total Bilirubin 11/21/2020 0.7  0.3 - 1.2 mg/dL Final  . GFR, Estimated 11/21/2020 42* >60 mL/min Final   Comment: (NOTE) Calculated using the CKD-EPI Creatinine Equation (2021)   . Anion gap 11/21/2020 13  5 - 15 Final   Performed at [facility], [address] [place], [place] [address]  . WBC 11/21/2020 10.7* 4.0 - 10.5 K/uL Final  . RBC 11/21/2020 4.33  3.87 - 5.11 MIL/uL Final  . Hemoglobin 11/21/2020 13.2  12.0 - 15.0 g/dL Final  . HCT 11/21/2020 39.7  36.0 - 46.0 % Final  . MCV 11/21/2020 91.7  80.0 - 100.0 fL Final  . MCH 11/21/2020 30.5  26.0 - 34.0 pg Final  . MCHC 11/21/2020 33.2  30.0 - 36.0 g/dL Final  . RDW 11/21/2020 14.2  11.5 - 15.5 % Final  . Platelets 11/21/2020 239  150 - 400 K/uL Final  . nRBC 11/21/2020 0.0  0.0 - 0.2 % Final  . Neutrophils Relative % 11/21/2020 80  % Final  . Neutro Abs 11/21/2020 8.7* 1.7 - 7.7 K/uL Final  . Lymphocytes Relative 11/21/2020 9  % Final  . Lymphs Abs 11/21/2020 0.9  0.7 - 4.0 K/uL Final  . Monocytes Relative 11/21/2020 7  %  Final  . Monocytes Absolute 11/21/2020 0.7  0.1 - 1.0 K/uL Final  . Eosinophils Relative 11/21/2020 2  % Final  . Eosinophils Absolute 11/21/2020 0.2  0.0 - 0.5 K/uL Final  . Basophils Relative 11/21/2020 1  % Final  . Basophils Absolute 11/21/2020 0.1  0.0 - 0.1 K/uL Final  . Immature Granulocytes 11/21/2020 1  % Final  . Abs Immature Granulocytes 11/21/2020 0.05  0.00 - 0.07 K/uL Final   Performed at [facility], [address], [place], [place] [address]    Assessment:  Adriana Martin is a 75 y.o. female withrecurrent right breast cancer, JAK2+thrombocytosis, and amonoclonal gammopathyof unknown significance (MGUS).  She has a history of stageIrightbreast cancers/p lumpectomy and SLN biopsy on 02/18/2011.Pathologyrevealed a 1.1 cm grade IIductal carcinoma tumor. Tumor was ER+ (>90%),PR+ (>90%),andHer2/neunegative by  FISH.Pathologic stagewas T1c pN0 (sn). She received accelerated partial breast irradiation(2012). She completedArimidexin 08/2016.   Right unilateral breast mammogram and ultrasoundon 07/12/2019 revealed a suspicious 2.2 x 0.7 x 1.5 cm irregular hypoechoicmass in the right breast at 10 o'clock, with at least 3 possible satellite lesions(each measuring 3-4 mm)extending inferior and medial. There was no right axillary lymphadenopathy.  Ultrasound guidedright breast core needle biopsyon 07/21/2019 revealed a 12 mm grade II invasive mammary carcinomawith lobular features.Tumor is ER + (> 90%), PR + (> 90%), and Her2/neu 1+.  She underwent a right mastectomy and SLN biopsy on 08/09/2019.  Pathology revealed a 4.3 x 2.1 x 2.0 cm grade II invasive lobular carcinoma. There was lobular carcinoma in situ focally present.  Margins were uninvolved by invasive carcinoma.  Closest margin was < 0.5 mm from the inferior and 1 mm from the deep margin.  There was 1 of 10 lymph nodes with metastatic carcinoma.  The largest metastatic deposit was 2 mm.  Tumor was ER was positive (>90%), PR positive (>90%), and Her2/neu 1+.   Pathologic stage was mpT2 p N1a.  Mammaprint revealed a low risk luminal-type (A).  From the MINDACT trial if lymph node negative, ER+ and Her2/neu - there was a 97.8% chance of living without distant recurrence of breast cancer at 5 years (DMFI).  She received a 3 week hypofractionated course of right breast radiation (02/15/2020 - 03/08/2020).  She began tamoxifen in 2021.  Left screening mammogram on 06/27/2020 revealed no evidence of malignancy.  CA27.29 has been followed: 26.5 on 04/15/2011, 31.3 on 12/11/2019, 19.3 on 04/24/2020, 22.5 on 07/25/2020, and 22.8 on 11/21/2020.   She has hadchronicthrombocytosissince2001.JAK2 V617Fwas positive on 12/14/2017. Platelet countranged between 400,000 - 500,000. She had a bone marrowin 2001 (no report available). She was  previously onhydroxyurea(discontinued in 05/2016).    She has a monoclonal gammopathyof unknown significance (MGUS).SPEPon 03/26/2019revealed a0.3 gm/dL IgG monoclonal proteinwith lambda light chain specificity. Bone surveyon 12/14/2017 revealed scattered lytic lesions throughout the calvarium concerning for metastases or myeloma.There was compression fractures at T8 and L1(new since 2017).  Bone survey on 07/27/2019 showed a few scattered lucencies were noted in the calvarium, unchanged. There were no lytic myelomatous lesions identified in the remainder of the axial or appendicular skeleton. There was stable T8 and L1 compression fractures. There was a right total shoulder arthroplasty with remote appearing fracture below the prosthesis (2020).  M-spikehas been followed (gm/dL): 0.3 on03/26/2019, 0.2 on 06/07/2018,0.2 on 06/06/2019, 0 on 07/25/2020 and 0 on 11/21/2020.   Shehas stageIIIchronic kidney disease.Creatinineis 1.27 (CrCl 38.4 ml/min) on03/22/2021.  She hasa history ofiron deficiency anemia.Last colonoscopywasin 2012(no report available).Ferritinhas been followed: 66 on06/05/2014,  82 on 07/02/2015, 385 on09/26/2017 and 609 on06/19/2018. Iron saturation was 17% on 03/09/2017. She is not on oral iron.  Bone density on 12/18/2019 revealed osteopenia with a T-score of -2.1 in the right femoral neck.   The patient was seen in the [facility] ER and admitted to [facility] from 03/10/2020 - 03/15/2020 with an acute ischemic stroke.  She received TPA as well as thrombectomy. Head CT on 03/11/2020 revealed an evolving acute left MCA territory infarct. She was discharged to [facility] on Eliquis.  The patient received the [place] COVID-19 vaccine on 11/30/2019 and 12/26/2019.  Symptomatically, she has been fine. She denies shortness of breath, chest pain, palpitations, nausea, vomiting, lumps, and bumps. Her right arm has healed well s/p  fracture.  She denies any breast concerns.  Exam is stable.  Plan: 1.   Labs today: CBC with diff, CMP, CA27.29, SPEP. 2.   Recurrent right breast cancer(2020) She initially had right breast cancer in 2012.                         Sheunderwentlumpectomy and sentinel lymph node biopsy              She receivedaccelerated partial breast radiation and 5 years of Arimidex.             Right unilateral breast mammogram on 07/12/2019 revealed a suspicious 2.2 x 0.7 x 1.5 cm irregular hypoechoicmass.              Ultrasound guidedright breast core needle biopsyon 07/21/2019 revealed invasive mammary carcinomawith lobular features.            She underwent right mastectomy and SLN biopsy on 08/09/2019.               Pathology revealed a 4.3 cm grade II invasive lobular carcinoma.                             One of 10 lymph nodes revealed metastatic carcinoma.                           Tumor was ER positive (>90%), PR positive (>90%), and Her2/neu 1+.                            Pathologic stage was mpT2 p N1a.             Mammaprint revealed a low risk luminal-type (A).   She received a 3 week hypofractionated course of right breast radiation (02/15/2020 - 03/08/2020).              Symptomatically, she continues to do well on tamoxifen.   Patient wishes to continue tamoxifen (rather than switch to an AI).  Exam reveals no evidence of recurrent disease.  Left screening mammogram on 06/27/2020 revealed no evidence of malignancy.  CA27.29 is 22.8 (normal).  Continue to monitor.       3. Osteopenia  Bone density on 12/18/2019 confirmed osteopenia with a T-score of -2.1 in the right femoral neck.   Continue calcium and vitamin D. 4.   Essential thrombocytosis She has an underlying myeloproliferative disorder (JAK2+).  Hematocrit39.7. Hemoglobin13.2. MCV91.7. Platelets239,000. [id number] today. She has an underlying  myeloproliferative disorder (essential thrombocythemia). Original bone marrowreportfrom 2001is unavailable. Patient was previously on hydroxyurea.  Platelet goal is < 400,000.  Continue to monitor off hydroxyurea. 5. Monoclonalgammopathy of unknown significance(MGUS) M-spikewas 0.2 g/dL on 06/06/2019 and 0 on 003/11/2020. Bone survey on 07/27/2019 revealed a few scattered stable lucencies were in the calvarium.                         Stable T8 and L1 compression fractures.                          Right total shoulder arthroplasty with remote fracture below the prosthesis (11/2018). Continue to monitor. 6.   RTC in 3 months for labs (CBC with diff, CMP). 7.   RTC in 6 months for MD assessment and labs (CBC with diff, CMP, CA27.29).  I discussed the assessment and treatment plan with the patient.  The patient was provided an opportunity to ask questions and all were answered.  The patient agreed with the plan and demonstrated an understanding of the instructions.  The patient was advised to call back if the symptoms worsen or if the condition fails to improve as anticipated.   Lequita Asal, MD, PhD    11/21/2020, 11:36 AM  I, Mirian Mo Tufford, am acting as a Education administrator for [employer]. Mike Gip, MD.   I, Toriano Aikey C. Mike Gip, MD, have reviewed the above documentation for accuracy and completeness, and I agree with the above.

## 2020-11-21 ENCOUNTER — Encounter: Payer: Self-pay | Admitting: Hematology and Oncology

## 2020-11-21 ENCOUNTER — Inpatient Hospital Stay: Payer: Medicare Other | Attending: Hematology and Oncology | Admitting: Hematology and Oncology

## 2020-11-21 ENCOUNTER — Other Ambulatory Visit: Payer: Self-pay

## 2020-11-21 ENCOUNTER — Inpatient Hospital Stay: Payer: Medicare Other

## 2020-11-21 VITALS — BP 135/63 | HR 63 | Temp 97.8°F | Resp 16 | Wt 174.2 lb

## 2020-11-21 DIAGNOSIS — Z9011 Acquired absence of right breast and nipple: Secondary | ICD-10-CM | POA: Insufficient documentation

## 2020-11-21 DIAGNOSIS — Z8673 Personal history of transient ischemic attack (TIA), and cerebral infarction without residual deficits: Secondary | ICD-10-CM | POA: Insufficient documentation

## 2020-11-21 DIAGNOSIS — D473 Essential (hemorrhagic) thrombocythemia: Secondary | ICD-10-CM | POA: Diagnosis not present

## 2020-11-21 DIAGNOSIS — C50811 Malignant neoplasm of overlapping sites of right female breast: Secondary | ICD-10-CM

## 2020-11-21 DIAGNOSIS — N183 Chronic kidney disease, stage 3 unspecified: Secondary | ICD-10-CM | POA: Diagnosis not present

## 2020-11-21 DIAGNOSIS — Z170 Estrogen receptor positive status [ER+]: Secondary | ICD-10-CM

## 2020-11-21 DIAGNOSIS — D472 Monoclonal gammopathy: Secondary | ICD-10-CM | POA: Diagnosis not present

## 2020-11-21 DIAGNOSIS — Z87891 Personal history of nicotine dependence: Secondary | ICD-10-CM | POA: Insufficient documentation

## 2020-11-21 DIAGNOSIS — M85851 Other specified disorders of bone density and structure, right thigh: Secondary | ICD-10-CM | POA: Insufficient documentation

## 2020-11-21 DIAGNOSIS — Z923 Personal history of irradiation: Secondary | ICD-10-CM | POA: Insufficient documentation

## 2020-11-21 DIAGNOSIS — C50911 Malignant neoplasm of unspecified site of right female breast: Secondary | ICD-10-CM | POA: Insufficient documentation

## 2020-11-21 LAB — CBC WITH DIFFERENTIAL/PLATELET
Abs Immature Granulocytes: 0.05 10*3/uL (ref 0.00–0.07)
Basophils Absolute: 0.1 10*3/uL (ref 0.0–0.1)
Basophils Relative: 1 %
Eosinophils Absolute: 0.2 10*3/uL (ref 0.0–0.5)
Eosinophils Relative: 2 %
HCT: 39.7 % (ref 36.0–46.0)
Hemoglobin: 13.2 g/dL (ref 12.0–15.0)
Immature Granulocytes: 1 %
Lymphocytes Relative: 9 %
Lymphs Abs: 0.9 10*3/uL (ref 0.7–4.0)
MCH: 30.5 pg (ref 26.0–34.0)
MCHC: 33.2 g/dL (ref 30.0–36.0)
MCV: 91.7 fL (ref 80.0–100.0)
Monocytes Absolute: 0.7 10*3/uL (ref 0.1–1.0)
Monocytes Relative: 7 %
Neutro Abs: 8.7 10*3/uL — ABNORMAL HIGH (ref 1.7–7.7)
Neutrophils Relative %: 80 %
Platelets: 239 10*3/uL (ref 150–400)
RBC: 4.33 MIL/uL (ref 3.87–5.11)
RDW: 14.2 % (ref 11.5–15.5)
WBC: 10.7 10*3/uL — ABNORMAL HIGH (ref 4.0–10.5)
nRBC: 0 % (ref 0.0–0.2)

## 2020-11-21 LAB — COMPREHENSIVE METABOLIC PANEL
ALT: 22 U/L (ref 0–44)
AST: 31 U/L (ref 15–41)
Albumin: 3.6 g/dL (ref 3.5–5.0)
Alkaline Phosphatase: 58 U/L (ref 38–126)
Anion gap: 13 (ref 5–15)
BUN: 25 mg/dL — ABNORMAL HIGH (ref 8–23)
CO2: 28 mmol/L (ref 22–32)
Calcium: 9.6 mg/dL (ref 8.9–10.3)
Chloride: 95 mmol/L — ABNORMAL LOW (ref 98–111)
Creatinine, Ser: 1.31 mg/dL — ABNORMAL HIGH (ref 0.44–1.00)
GFR, Estimated: 42 mL/min — ABNORMAL LOW (ref >60)
Glucose, Bld: 94 mg/dL (ref 70–99)
Potassium: 3.9 mmol/L (ref 3.5–5.1)
Sodium: 136 mmol/L (ref 135–145)
Total Bilirubin: 0.7 mg/dL (ref 0.3–1.2)
Total Protein: 7.4 g/dL (ref 6.5–8.1)

## 2020-11-21 NOTE — Progress Notes (Signed)
Patient denies new problems/concerns today.  Medications verified with daughter [name].

## 2020-11-22 LAB — CANCER ANTIGEN 27.29: CA 27.29: 22.8 U/mL (ref 0.0–38.6)

## 2020-11-25 LAB — PROTEIN ELECTROPHORESIS, SERUM
A/G Ratio: 1 (ref 0.7–1.7)
Albumin ELP: 3.4 g/dL (ref 2.9–4.4)
Alpha-1-Globulin: 0.4 g/dL (ref 0.0–0.4)
Alpha-2-Globulin: 1 g/dL (ref 0.4–1.0)
Beta Globulin: 1.1 g/dL (ref 0.7–1.3)
Gamma Globulin: 0.9 g/dL (ref 0.4–1.8)
Globulin, Total: 3.3 g/dL (ref 2.2–3.9)
Total Protein ELP: 6.7 g/dL (ref 6.0–8.5)

## 2020-12-18 IMAGING — MG MM DIGITAL DIAGNOSTIC UNILAT*R* W/ TOMO
4 series · 4 of 12 positions shown · non-contrast
Comparison: Previous exam(s).

CLINICAL DATA: Screening recall for a right breast distortion. The
patient has history of right breast lumpectomy in 8898. She also has
family history of breast cancer in her mother and her daughter.

EXAM:
DIGITAL DIAGNOSTIC RIGHT MAMMOGRAM WITH CAD AND TOMO
ULTRASOUND RIGHT BREAST

[R CC synth-2D]
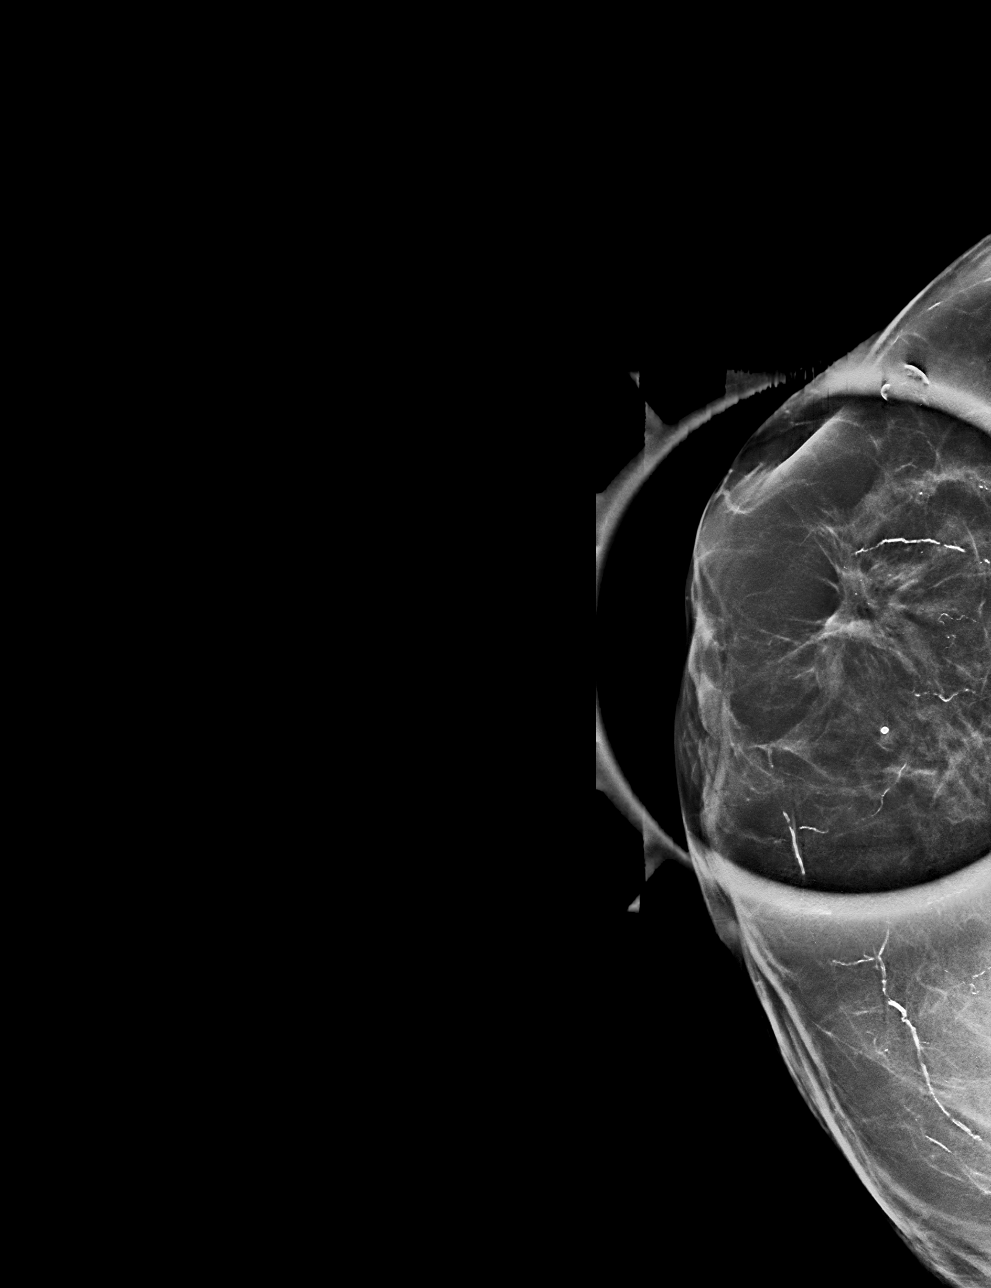

[R MLO synth-2D]
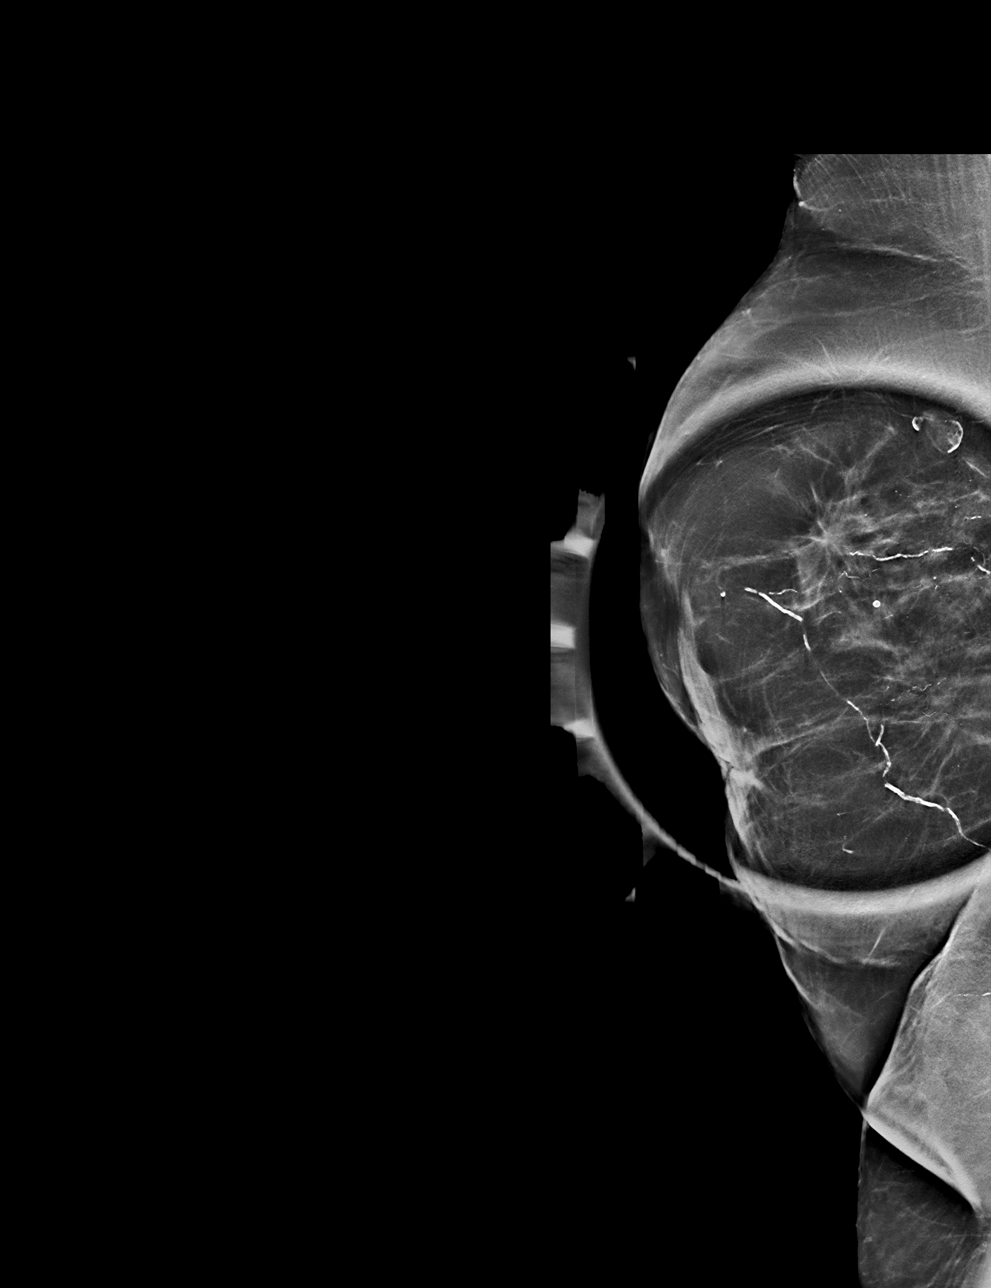

[R MLO tomo · tomo slice 29/57.0]
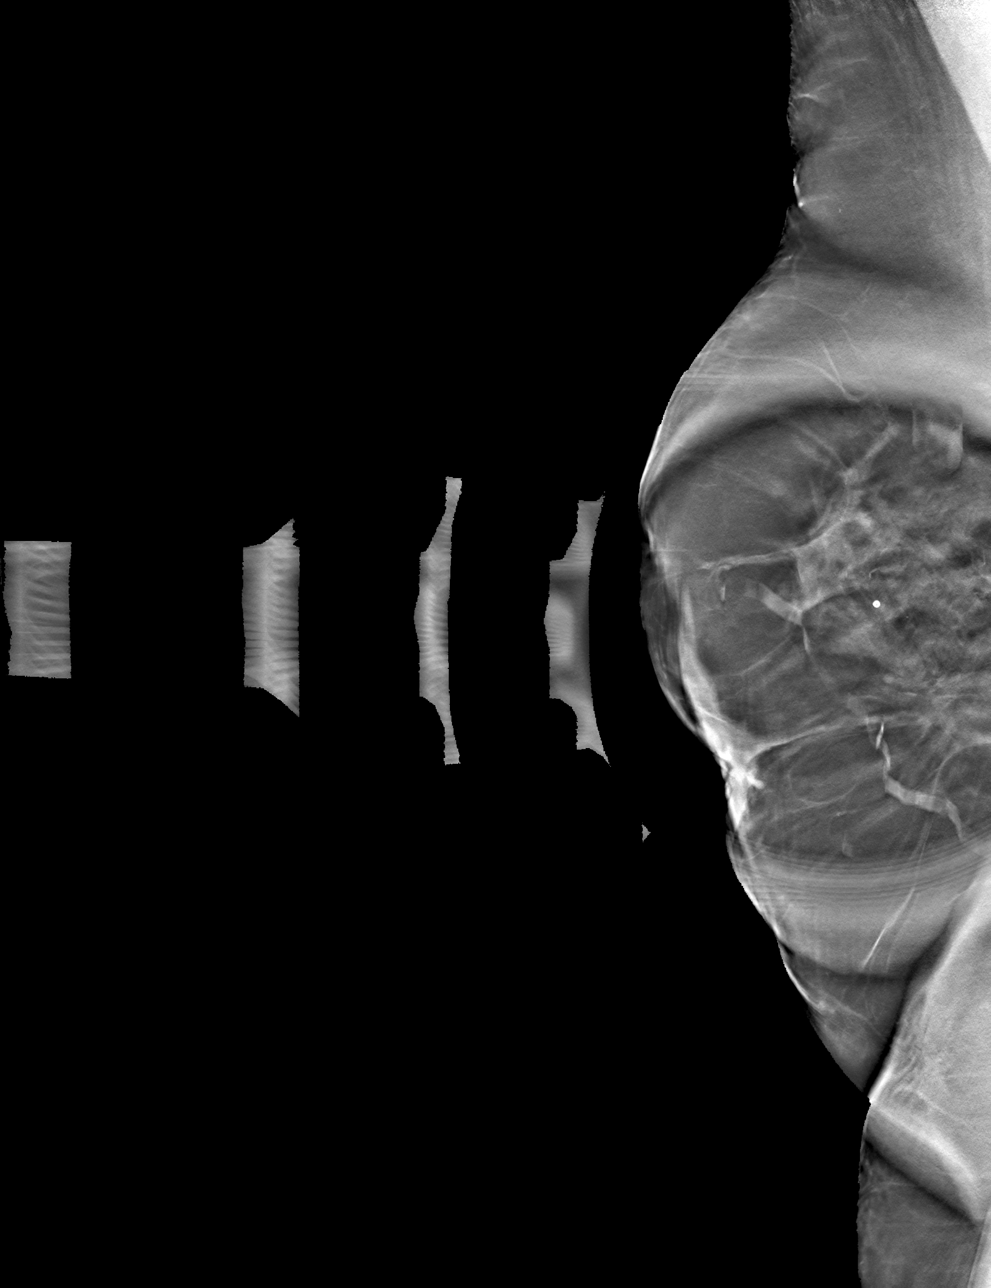

[R CC tomo · tomo slice 25/50.0]
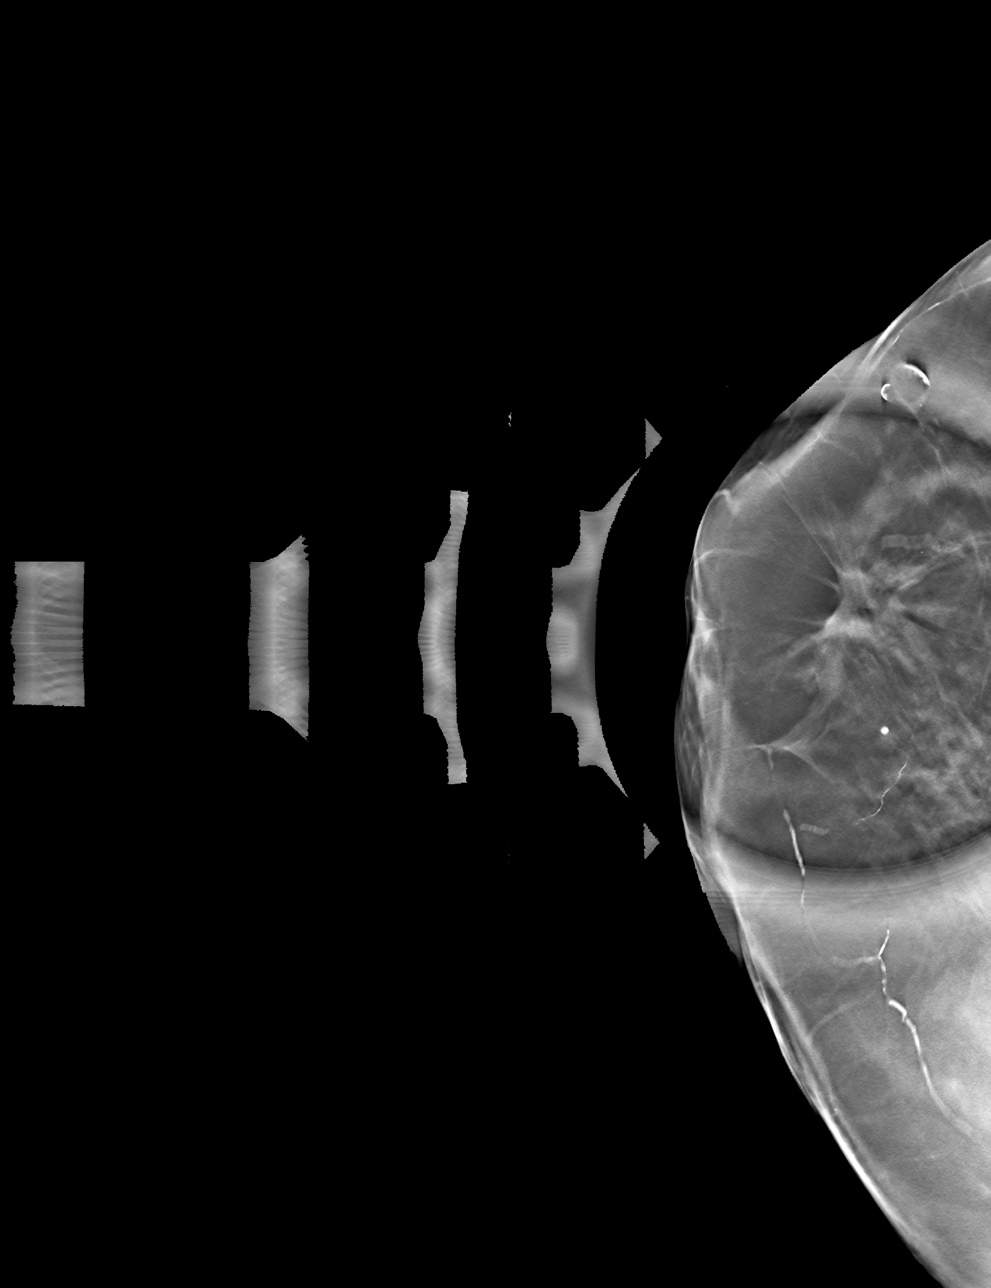

[4 of 12 positions shown; findings below may reference images not displayed]

ACR Breast Density Category b: There are scattered areas of
fibroglandular density.
FINDINGS: Spot compression tomosynthesis images reveal a persistent new
distortion in the upper-outer quadrant of the right breast, anterior
depth. Patient's lumpectomy site is seen in the central posterior
right breast, medial and posterior to the distortion.

Mammographic images were processed with CAD.

On physical exam, there is a firm palpable lump in the upper-outer
quadrant of the right breast.

Targeted ultrasound is performed, showing an irregular hypoechoic
mass at 10 o'clock, 2 cm from the nipple measuring approximately
x 0.7 x 1.5 cm. There are a few nodular smaller masses extending
slightly inferior and closer to the nipple, each measuring 3-4 mm.
These are suspicious for satellite lesions. The mass and the
satellite lesions together span at least 4.0 cm. Ultrasound of the
right axilla demonstrates multiple normal-appearing lymph nodes.
IMPRESSION: 1. There is a suspicious mass in the right breast at 10 o'clock,
with at least 3 possible satellite lesions extending inferior and
medial to this mass.

2.  No evidence of right axillary lymphadenopathy.

RECOMMENDATION:
Ultrasound guided biopsy is recommended for the dominant mass in the
right breast. This will be scheduled at the patient's earliest
convenience.

I have discussed the findings and recommendations with the patient.
If applicable, a reminder letter will be sent to the patient
regarding the next appointment.

BI-RADS CATEGORY  5: Highly suggestive of malignancy.

## 2021-01-31 DIAGNOSIS — R0989 Other specified symptoms and signs involving the circulatory and respiratory systems: Secondary | ICD-10-CM | POA: Diagnosis not present

## 2021-01-31 DIAGNOSIS — I509 Heart failure, unspecified: Secondary | ICD-10-CM | POA: Diagnosis not present

## 2021-01-31 DIAGNOSIS — J9811 Atelectasis: Secondary | ICD-10-CM | POA: Diagnosis not present

## 2021-01-31 DIAGNOSIS — J410 Simple chronic bronchitis: Secondary | ICD-10-CM | POA: Diagnosis not present

## 2021-01-31 DIAGNOSIS — I5032 Chronic diastolic (congestive) heart failure: Secondary | ICD-10-CM | POA: Diagnosis not present

## 2021-01-31 DIAGNOSIS — R0602 Shortness of breath: Secondary | ICD-10-CM | POA: Diagnosis not present

## 2021-01-31 DIAGNOSIS — J811 Chronic pulmonary edema: Secondary | ICD-10-CM | POA: Diagnosis not present

## 2021-02-04 DIAGNOSIS — I5022 Chronic systolic (congestive) heart failure: Secondary | ICD-10-CM | POA: Diagnosis not present

## 2021-02-27 ENCOUNTER — Other Ambulatory Visit: Payer: Self-pay | Admitting: *Deleted

## 2021-02-27 DIAGNOSIS — I5032 Chronic diastolic (congestive) heart failure: Secondary | ICD-10-CM | POA: Diagnosis not present

## 2021-02-27 DIAGNOSIS — R0602 Shortness of breath: Secondary | ICD-10-CM | POA: Diagnosis not present

## 2021-02-27 DIAGNOSIS — Z79899 Other long term (current) drug therapy: Secondary | ICD-10-CM | POA: Diagnosis not present

## 2021-02-27 MED ORDER — TAMOXIFEN CITRATE 20 MG PO TABS: ORAL_TABLET | ORAL | 0 refills | Status: DC

## 2021-03-03 ENCOUNTER — Other Ambulatory Visit: Payer: Medicare Other

## 2021-03-06 ENCOUNTER — Other Ambulatory Visit: Payer: Self-pay

## 2021-03-06 DIAGNOSIS — Z170 Estrogen receptor positive status [ER+]: Secondary | ICD-10-CM

## 2021-03-07 ENCOUNTER — Inpatient Hospital Stay: Payer: Medicare Other | Attending: Internal Medicine

## 2021-03-07 ENCOUNTER — Other Ambulatory Visit: Payer: Self-pay

## 2021-03-07 DIAGNOSIS — D473 Essential (hemorrhagic) thrombocythemia: Secondary | ICD-10-CM | POA: Diagnosis not present

## 2021-03-07 DIAGNOSIS — M85851 Other specified disorders of bone density and structure, right thigh: Secondary | ICD-10-CM | POA: Diagnosis not present

## 2021-03-07 DIAGNOSIS — N183 Chronic kidney disease, stage 3 unspecified: Secondary | ICD-10-CM | POA: Diagnosis not present

## 2021-03-07 DIAGNOSIS — Z923 Personal history of irradiation: Secondary | ICD-10-CM | POA: Insufficient documentation

## 2021-03-07 DIAGNOSIS — Z170 Estrogen receptor positive status [ER+]: Secondary | ICD-10-CM | POA: Diagnosis not present

## 2021-03-07 DIAGNOSIS — C50911 Malignant neoplasm of unspecified site of right female breast: Secondary | ICD-10-CM | POA: Diagnosis not present

## 2021-03-07 DIAGNOSIS — Z87891 Personal history of nicotine dependence: Secondary | ICD-10-CM | POA: Diagnosis not present

## 2021-03-07 DIAGNOSIS — Z9011 Acquired absence of right breast and nipple: Secondary | ICD-10-CM | POA: Insufficient documentation

## 2021-03-07 DIAGNOSIS — D472 Monoclonal gammopathy: Secondary | ICD-10-CM | POA: Diagnosis not present

## 2021-03-07 DIAGNOSIS — Z79899 Other long term (current) drug therapy: Secondary | ICD-10-CM | POA: Insufficient documentation

## 2021-03-07 LAB — CBC WITH DIFFERENTIAL/PLATELET
Abs Immature Granulocytes: 0.06 10*3/uL (ref 0.00–0.07)
Basophils Absolute: 0.1 10*3/uL (ref 0.0–0.1)
Basophils Relative: 1 %
Eosinophils Absolute: 0.2 10*3/uL (ref 0.0–0.5)
Eosinophils Relative: 1 %
HCT: 40.1 % (ref 36.0–46.0)
Hemoglobin: 13.4 g/dL (ref 12.0–15.0)
Immature Granulocytes: 1 %
Lymphocytes Relative: 5 %
Lymphs Abs: 0.6 10*3/uL — ABNORMAL LOW (ref 0.7–4.0)
MCH: 30.4 pg (ref 26.0–34.0)
MCHC: 33.4 g/dL (ref 30.0–36.0)
MCV: 90.9 fL (ref 80.0–100.0)
Monocytes Absolute: 0.8 10*3/uL (ref 0.1–1.0)
Monocytes Relative: 7 %
Neutro Abs: 10.9 10*3/uL — ABNORMAL HIGH (ref 1.7–7.7)
Neutrophils Relative %: 85 %
Platelets: 200 10*3/uL (ref 150–400)
RBC: 4.41 MIL/uL (ref 3.87–5.11)
RDW: 13.3 % (ref 11.5–15.5)
WBC: 12.7 10*3/uL — ABNORMAL HIGH (ref 4.0–10.5)
nRBC: 0 % (ref 0.0–0.2)

## 2021-03-07 LAB — COMPREHENSIVE METABOLIC PANEL
ALT: 159 U/L — ABNORMAL HIGH (ref 0–44)
AST: 102 U/L — ABNORMAL HIGH (ref 15–41)
Albumin: 3.8 g/dL (ref 3.5–5.0)
Alkaline Phosphatase: 58 U/L (ref 38–126)
Anion gap: 11 (ref 5–15)
BUN: 25 mg/dL — ABNORMAL HIGH (ref 8–23)
CO2: 28 mmol/L (ref 22–32)
Calcium: 9.6 mg/dL (ref 8.9–10.3)
Chloride: 98 mmol/L (ref 98–111)
Creatinine, Ser: 1.44 mg/dL — ABNORMAL HIGH (ref 0.44–1.00)
GFR, Estimated: 38 mL/min — ABNORMAL LOW (ref >60)
Glucose, Bld: 124 mg/dL — ABNORMAL HIGH (ref 70–99)
Potassium: 3.4 mmol/L — ABNORMAL LOW (ref 3.5–5.1)
Sodium: 137 mmol/L (ref 135–145)
Total Bilirubin: 0.5 mg/dL (ref 0.3–1.2)
Total Protein: 7.4 g/dL (ref 6.5–8.1)

## 2021-03-24 DIAGNOSIS — S0993XA Unspecified injury of face, initial encounter: Secondary | ICD-10-CM | POA: Diagnosis not present

## 2021-03-24 DIAGNOSIS — R6884 Jaw pain: Secondary | ICD-10-CM | POA: Diagnosis not present

## 2021-03-24 DIAGNOSIS — J449 Chronic obstructive pulmonary disease, unspecified: Secondary | ICD-10-CM | POA: Diagnosis not present

## 2021-03-24 DIAGNOSIS — Z87891 Personal history of nicotine dependence: Secondary | ICD-10-CM | POA: Diagnosis not present

## 2021-03-24 DIAGNOSIS — S199XXA Unspecified injury of neck, initial encounter: Secondary | ICD-10-CM | POA: Diagnosis not present

## 2021-03-24 DIAGNOSIS — M4312 Spondylolisthesis, cervical region: Secondary | ICD-10-CM | POA: Diagnosis not present

## 2021-03-24 DIAGNOSIS — S0181XA Laceration without foreign body of other part of head, initial encounter: Secondary | ICD-10-CM | POA: Diagnosis not present

## 2021-03-24 DIAGNOSIS — Z8673 Personal history of transient ischemic attack (TIA), and cerebral infarction without residual deficits: Secondary | ICD-10-CM | POA: Diagnosis not present

## 2021-03-24 DIAGNOSIS — Z7982 Long term (current) use of aspirin: Secondary | ICD-10-CM | POA: Diagnosis not present

## 2021-03-24 DIAGNOSIS — S02612A Fracture of condylar process of left mandible, initial encounter for closed fracture: Secondary | ICD-10-CM | POA: Diagnosis not present

## 2021-03-24 DIAGNOSIS — I110 Hypertensive heart disease with heart failure: Secondary | ICD-10-CM | POA: Diagnosis not present

## 2021-03-24 DIAGNOSIS — I509 Heart failure, unspecified: Secondary | ICD-10-CM | POA: Diagnosis not present

## 2021-03-24 DIAGNOSIS — E785 Hyperlipidemia, unspecified: Secondary | ICD-10-CM | POA: Diagnosis not present

## 2021-03-24 DIAGNOSIS — S02611A Fracture of condylar process of right mandible, initial encounter for closed fracture: Secondary | ICD-10-CM | POA: Diagnosis not present

## 2021-03-31 DIAGNOSIS — S0181XD Laceration without foreign body of other part of head, subsequent encounter: Secondary | ICD-10-CM | POA: Diagnosis not present

## 2021-03-31 DIAGNOSIS — Z9181 History of falling: Secondary | ICD-10-CM | POA: Diagnosis not present

## 2021-03-31 DIAGNOSIS — Z4802 Encounter for removal of sutures: Secondary | ICD-10-CM | POA: Diagnosis not present

## 2021-03-31 DIAGNOSIS — S02611D Fracture of condylar process of right mandible, subsequent encounter for fracture with routine healing: Secondary | ICD-10-CM | POA: Diagnosis not present

## 2021-04-02 DIAGNOSIS — S02612A Fracture of condylar process of left mandible, initial encounter for closed fracture: Secondary | ICD-10-CM | POA: Diagnosis not present

## 2021-04-02 DIAGNOSIS — Z923 Personal history of irradiation: Secondary | ICD-10-CM | POA: Insufficient documentation

## 2021-04-04 ENCOUNTER — Emergency Department: Payer: Medicare Other

## 2021-04-04 ENCOUNTER — Other Ambulatory Visit: Payer: Self-pay

## 2021-04-04 ENCOUNTER — Inpatient Hospital Stay
Admission: EM | Admit: 2021-04-04 | Discharge: 2021-04-07 | DRG: 392 | Disposition: A | Discharge status: Home Health | Payer: Medicare Other | Attending: Internal Medicine | Admitting: Internal Medicine

## 2021-04-04 ENCOUNTER — Encounter: Payer: Self-pay | Admitting: Internal Medicine

## 2021-04-04 DIAGNOSIS — Z6831 Body mass index (BMI) 31.0-31.9, adult: Secondary | ICD-10-CM

## 2021-04-04 DIAGNOSIS — I130 Hypertensive heart and chronic kidney disease with heart failure and stage 1 through stage 4 chronic kidney disease, or unspecified chronic kidney disease: Secondary | ICD-10-CM | POA: Diagnosis present

## 2021-04-04 DIAGNOSIS — Z7901 Long term (current) use of anticoagulants: Secondary | ICD-10-CM

## 2021-04-04 DIAGNOSIS — Z950 Presence of cardiac pacemaker: Secondary | ICD-10-CM

## 2021-04-04 DIAGNOSIS — J449 Chronic obstructive pulmonary disease, unspecified: Secondary | ICD-10-CM | POA: Diagnosis not present

## 2021-04-04 DIAGNOSIS — Z9181 History of falling: Secondary | ICD-10-CM

## 2021-04-04 DIAGNOSIS — N179 Acute kidney failure, unspecified: Secondary | ICD-10-CM | POA: Diagnosis not present

## 2021-04-04 DIAGNOSIS — E039 Hypothyroidism, unspecified: Secondary | ICD-10-CM | POA: Diagnosis not present

## 2021-04-04 DIAGNOSIS — K5730 Diverticulosis of large intestine without perforation or abscess without bleeding: Secondary | ICD-10-CM | POA: Diagnosis present

## 2021-04-04 DIAGNOSIS — C50919 Malignant neoplasm of unspecified site of unspecified female breast: Secondary | ICD-10-CM | POA: Diagnosis present

## 2021-04-04 DIAGNOSIS — C50911 Malignant neoplasm of unspecified site of right female breast: Secondary | ICD-10-CM | POA: Diagnosis present

## 2021-04-04 DIAGNOSIS — K219 Gastro-esophageal reflux disease without esophagitis: Secondary | ICD-10-CM | POA: Diagnosis present

## 2021-04-04 DIAGNOSIS — I10 Essential (primary) hypertension: Secondary | ICD-10-CM | POA: Diagnosis not present

## 2021-04-04 DIAGNOSIS — I4892 Unspecified atrial flutter: Secondary | ICD-10-CM | POA: Diagnosis not present

## 2021-04-04 DIAGNOSIS — F419 Anxiety disorder, unspecified: Secondary | ICD-10-CM | POA: Diagnosis present

## 2021-04-04 DIAGNOSIS — N1832 Chronic kidney disease, stage 3b: Secondary | ICD-10-CM

## 2021-04-04 DIAGNOSIS — Z807 Family history of other malignant neoplasms of lymphoid, hematopoietic and related tissues: Secondary | ICD-10-CM

## 2021-04-04 DIAGNOSIS — K529 Noninfective gastroenteritis and colitis, unspecified: Principal | ICD-10-CM | POA: Diagnosis present

## 2021-04-04 DIAGNOSIS — K922 Gastrointestinal hemorrhage, unspecified: Secondary | ICD-10-CM | POA: Diagnosis not present

## 2021-04-04 DIAGNOSIS — I5032 Chronic diastolic (congestive) heart failure: Secondary | ICD-10-CM | POA: Diagnosis not present

## 2021-04-04 DIAGNOSIS — I482 Chronic atrial fibrillation, unspecified: Secondary | ICD-10-CM | POA: Diagnosis not present

## 2021-04-04 DIAGNOSIS — E785 Hyperlipidemia, unspecified: Secondary | ICD-10-CM | POA: Diagnosis not present

## 2021-04-04 DIAGNOSIS — Z9011 Acquired absence of right breast and nipple: Secondary | ICD-10-CM

## 2021-04-04 DIAGNOSIS — Z20822 Contact with and (suspected) exposure to covid-19: Secondary | ICD-10-CM | POA: Diagnosis not present

## 2021-04-04 DIAGNOSIS — K7460 Unspecified cirrhosis of liver: Secondary | ICD-10-CM | POA: Diagnosis not present

## 2021-04-04 DIAGNOSIS — E669 Obesity, unspecified: Secondary | ICD-10-CM | POA: Diagnosis not present

## 2021-04-04 DIAGNOSIS — F0390 Unspecified dementia without behavioral disturbance: Secondary | ICD-10-CM | POA: Diagnosis present

## 2021-04-04 DIAGNOSIS — Z79810 Long term (current) use of selective estrogen receptor modulators (SERMs): Secondary | ICD-10-CM

## 2021-04-04 DIAGNOSIS — I509 Heart failure, unspecified: Secondary | ICD-10-CM | POA: Diagnosis not present

## 2021-04-04 DIAGNOSIS — Z923 Personal history of irradiation: Secondary | ICD-10-CM

## 2021-04-04 DIAGNOSIS — I4891 Unspecified atrial fibrillation: Secondary | ICD-10-CM | POA: Diagnosis not present

## 2021-04-04 DIAGNOSIS — K625 Hemorrhage of anus and rectum: Secondary | ICD-10-CM | POA: Diagnosis present

## 2021-04-04 DIAGNOSIS — Z800 Family history of malignant neoplasm of digestive organs: Secondary | ICD-10-CM

## 2021-04-04 DIAGNOSIS — Z888 Allergy status to other drugs, medicaments and biological substances status: Secondary | ICD-10-CM

## 2021-04-04 DIAGNOSIS — F32A Depression, unspecified: Secondary | ICD-10-CM | POA: Diagnosis present

## 2021-04-04 HISTORY — DX: Presence of cardiac pacemaker: Z95.0

## 2021-04-04 HISTORY — DX: Hyperlipidemia, unspecified: E78.5

## 2021-04-04 HISTORY — DX: Chronic diastolic (congestive) heart failure: I50.32

## 2021-04-04 HISTORY — DX: Chronic obstructive pulmonary disease, unspecified: J44.9

## 2021-04-04 HISTORY — DX: Gastro-esophageal reflux disease without esophagitis: K21.9

## 2021-04-04 HISTORY — DX: Hypothyroidism, unspecified: E03.9

## 2021-04-04 HISTORY — DX: Malignant neoplasm of unspecified site of unspecified female breast: C50.919

## 2021-04-04 HISTORY — DX: Essential (primary) hypertension: I10

## 2021-04-04 HISTORY — DX: Chronic kidney disease, stage 3 unspecified: N18.30

## 2021-04-04 HISTORY — DX: Unspecified cirrhosis of liver: K74.60

## 2021-04-04 HISTORY — DX: Unspecified atrial fibrillation: I48.91

## 2021-04-04 LAB — CBC
HCT: 40.2 % (ref 36.0–46.0)
HCT: 37.1 % (ref 36.0–46.0)
Hemoglobin: 13.3 g/dL (ref 12.0–15.0)
Hemoglobin: 12.5 g/dL (ref 12.0–15.0)
MCH: 30.9 pg (ref 26.0–34.0)
MCH: 30.6 pg (ref 26.0–34.0)
MCHC: 33.1 g/dL (ref 30.0–36.0)
MCHC: 33.7 g/dL (ref 30.0–36.0)
MCV: 93.5 fL (ref 80.0–100.0)
MCV: 90.9 fL (ref 80.0–100.0)
Platelets: 236 10*3/uL (ref 150–400)
Platelets: 237 10*3/uL (ref 150–400)
RBC: 4.3 MIL/uL (ref 3.87–5.11)
RBC: 4.08 MIL/uL (ref 3.87–5.11)
RDW: 14.4 % (ref 11.5–15.5)
RDW: 14.4 % (ref 11.5–15.5)
WBC: 15.4 10*3/uL — ABNORMAL HIGH (ref 4.0–10.5)
WBC: 16.8 10*3/uL — ABNORMAL HIGH (ref 4.0–10.5)
nRBC: 0.1 % (ref 0.0–0.2)
nRBC: 0 % (ref 0.0–0.2)

## 2021-04-04 LAB — BRAIN NATRIURETIC PEPTIDE: B Natriuretic Peptide: 268 pg/mL — ABNORMAL HIGH (ref 0.0–100.0)

## 2021-04-04 LAB — COMPREHENSIVE METABOLIC PANEL
ALT: 60 U/L — ABNORMAL HIGH (ref 0–44)
AST: 49 U/L — ABNORMAL HIGH (ref 15–41)
Albumin: 3.4 g/dL — ABNORMAL LOW (ref 3.5–5.0)
Alkaline Phosphatase: 62 U/L (ref 38–126)
Anion gap: 12 (ref 5–15)
BUN: 51 mg/dL — ABNORMAL HIGH (ref 8–23)
CO2: 24 mmol/L (ref 22–32)
Calcium: 9.4 mg/dL (ref 8.9–10.3)
Chloride: 101 mmol/L (ref 98–111)
Creatinine, Ser: 1.71 mg/dL — ABNORMAL HIGH (ref 0.44–1.00)
GFR, Estimated: 31 mL/min — ABNORMAL LOW (ref >60)
Glucose, Bld: 160 mg/dL — ABNORMAL HIGH (ref 70–99)
Potassium: 4.7 mmol/L (ref 3.5–5.1)
Sodium: 137 mmol/L (ref 135–145)
Total Bilirubin: 0.7 mg/dL (ref 0.3–1.2)
Total Protein: 6.8 g/dL (ref 6.5–8.1)

## 2021-04-04 LAB — CBC WITH DIFFERENTIAL/PLATELET
Abs Immature Granulocytes: 0.07 10*3/uL (ref 0.00–0.07)
Basophils Absolute: 0.1 10*3/uL (ref 0.0–0.1)
Basophils Relative: 0 %
Eosinophils Absolute: 0 10*3/uL (ref 0.0–0.5)
Eosinophils Relative: 0 %
HCT: 42.7 % (ref 36.0–46.0)
Hemoglobin: 14 g/dL (ref 12.0–15.0)
Immature Granulocytes: 0 %
Lymphocytes Relative: 4 %
Lymphs Abs: 0.7 10*3/uL (ref 0.7–4.0)
MCH: 30.4 pg (ref 26.0–34.0)
MCHC: 32.8 g/dL (ref 30.0–36.0)
MCV: 92.6 fL (ref 80.0–100.0)
Monocytes Absolute: 0.8 10*3/uL (ref 0.1–1.0)
Monocytes Relative: 4 %
Neutro Abs: 16 10*3/uL — ABNORMAL HIGH (ref 1.7–7.7)
Neutrophils Relative %: 92 %
Platelets: 262 10*3/uL (ref 150–400)
RBC: 4.61 MIL/uL (ref 3.87–5.11)
RDW: 14.3 % (ref 11.5–15.5)
WBC: 17.7 10*3/uL — ABNORMAL HIGH (ref 4.0–10.5)
nRBC: 0 % (ref 0.0–0.2)

## 2021-04-04 LAB — TYPE AND SCREEN
ABO/RH(D): A NEG
Antibody Screen: NEGATIVE

## 2021-04-04 LAB — RESP PANEL BY RT-PCR (FLU A&B, COVID) ARPGX2
Influenza A by PCR: NEGATIVE
Influenza B by PCR: NEGATIVE
SARS Coronavirus 2 by RT PCR: NEGATIVE

## 2021-04-04 LAB — PROCALCITONIN: Procalcitonin: 0.78 ng/mL

## 2021-04-04 LAB — APTT: aPTT: 28 seconds (ref 24–36)

## 2021-04-04 LAB — PROTIME-INR
INR: 1.2 (ref 0.8–1.2)
Prothrombin Time: 14.7 seconds (ref 11.4–15.2)

## 2021-04-04 LAB — LACTIC ACID, PLASMA: Lactic Acid, Venous: 1.6 mmol/L (ref 0.5–1.9)

## 2021-04-04 LAB — AMMONIA: Ammonia: 13 umol/L (ref 9–35)

## 2021-04-04 LAB — HEPARIN LEVEL (UNFRACTIONATED): Heparin Unfractionated: >1.1 IU/mL — ABNORMAL HIGH (ref 0.30–0.70)

## 2021-04-04 MED ORDER — MOMETASONE FURO-FORMOTEROL FUM 100-5 MCG/ACT IN AERO
2.0000 | INHALATION_SPRAY | Freq: Two times a day (BID) | RESPIRATORY_TRACT | Status: DC
  Administered 2021-04-04 – 2021-04-07 (×6): 2 via RESPIRATORY_TRACT
  Filled 2021-04-04: qty 8.8

## 2021-04-04 MED ORDER — PREDNISONE 5 MG PO TABS
15.0000 mg | ORAL_TABLET | Freq: Every day | ORAL | Status: DC
  Administered 2021-04-05 – 2021-04-07 (×3): 15 mg via ORAL
  Filled 2021-04-04 (×3): qty 1

## 2021-04-04 MED ORDER — HYDRALAZINE HCL 20 MG/ML IJ SOLN: 5.0000 mg | INTRAMUSCULAR | Status: DC | PRN

## 2021-04-04 MED ORDER — ADULT MULTIVITAMIN W/MINERALS CH
1.0000 | ORAL_TABLET | Freq: Every day | ORAL | Status: DC
  Administered 2021-04-05 – 2021-04-07 (×3): 1 via ORAL
  Filled 2021-04-04 (×3): qty 1

## 2021-04-04 MED ORDER — EZETIMIBE 10 MG PO TABS
10.0000 mg | ORAL_TABLET | Freq: Every day | ORAL | Status: DC
  Administered 2021-04-05 – 2021-04-07 (×3): 10 mg via ORAL
  Filled 2021-04-04 (×3): qty 1

## 2021-04-04 MED ORDER — ONDANSETRON HCL 4 MG/2ML IJ SOLN: 4.0000 mg | Freq: Three times a day (TID) | INTRAMUSCULAR | Status: DC | PRN

## 2021-04-04 MED ORDER — POTASSIUM CHLORIDE CRYS ER 10 MEQ PO TBCR
20.0000 meq | EXTENDED_RELEASE_TABLET | Freq: Two times a day (BID) | ORAL | Status: DC
  Administered 2021-04-04: 20 meq via ORAL
  Filled 2021-04-04: qty 2

## 2021-04-04 MED ORDER — PRAVASTATIN SODIUM 20 MG PO TABS
40.0000 mg | ORAL_TABLET | Freq: Every day | ORAL | Status: DC
  Administered 2021-04-04: 40 mg via ORAL
  Filled 2021-04-04: qty 2

## 2021-04-04 MED ORDER — TAMOXIFEN CITRATE 10 MG PO TABS
20.0000 mg | ORAL_TABLET | Freq: Every day | ORAL | Status: DC
  Administered 2021-04-05 – 2021-04-07 (×3): 20 mg via ORAL
  Filled 2021-04-04 (×3): qty 2

## 2021-04-04 MED ORDER — ALBUTEROL SULFATE HFA 108 (90 BASE) MCG/ACT IN AERS: 2.0000 | INHALATION_SPRAY | RESPIRATORY_TRACT | Status: DC | PRN

## 2021-04-04 MED ORDER — DONEPEZIL HCL 5 MG PO TABS
5.0000 mg | ORAL_TABLET | Freq: Every day | ORAL | Status: DC
  Administered 2021-04-04 – 2021-04-06 (×3): 5 mg via ORAL
  Filled 2021-04-04 (×4): qty 1

## 2021-04-04 MED ORDER — B COMPLEX-C PO TABS
1.0000 | ORAL_TABLET | Freq: Every day | ORAL | Status: DC
  Filled 2021-04-04: qty 1

## 2021-04-04 MED ORDER — METRONIDAZOLE 500 MG/100ML IV SOLN: 500.0000 mg | Freq: Once | INTRAVENOUS | Status: DC

## 2021-04-04 MED ORDER — LEVOTHYROXINE SODIUM 50 MCG PO TABS
50.0000 ug | ORAL_TABLET | Freq: Every day | ORAL | Status: DC
  Administered 2021-04-05 – 2021-04-07 (×3): 50 ug via ORAL
  Filled 2021-04-04 (×3): qty 1

## 2021-04-04 MED ORDER — PIPERACILLIN-TAZOBACTAM 3.375 G IVPB
3.3750 g | Freq: Three times a day (TID) | INTRAVENOUS | Status: DC
  Administered 2021-04-04 – 2021-04-05 (×3): 3.375 g via INTRAVENOUS
  Filled 2021-04-04 (×3): qty 50

## 2021-04-04 MED ORDER — ACETAMINOPHEN 325 MG PO TABS: 650.0000 mg | ORAL_TABLET | Freq: Four times a day (QID) | ORAL | Status: DC | PRN

## 2021-04-04 MED ORDER — ALBUTEROL SULFATE (2.5 MG/3ML) 0.083% IN NEBU: 2.5000 mg | INHALATION_SOLUTION | RESPIRATORY_TRACT | Status: DC | PRN

## 2021-04-04 MED ORDER — HYDROCORTISONE NA SUCCINATE PF 100 MG IJ SOLR
50.0000 mg | Freq: Once | INTRAMUSCULAR | Status: AC
  Administered 2021-04-04: 50 mg via INTRAVENOUS
  Filled 2021-04-04: qty 2

## 2021-04-04 MED ORDER — LACTATED RINGERS IV BOLUS
1000.0000 mL | Freq: Once | INTRAVENOUS | Status: AC
  Administered 2021-04-04: 1000 mL via INTRAVENOUS

## 2021-04-04 MED ORDER — IOHEXOL 350 MG/ML SOLN: 75.0000 mL | Freq: Once | INTRAVENOUS | Status: DC | PRN

## 2021-04-04 MED ORDER — IOHEXOL 350 MG/ML SOLN
75.0000 mL | Freq: Once | INTRAVENOUS | Status: AC | PRN
  Administered 2021-04-04: 75 mL via INTRAVENOUS

## 2021-04-04 MED ORDER — URSODIOL 300 MG PO CAPS
600.0000 mg | ORAL_CAPSULE | Freq: Two times a day (BID) | ORAL | Status: DC
  Administered 2021-04-04 – 2021-04-07 (×6): 600 mg via ORAL
  Filled 2021-04-04 (×7): qty 2

## 2021-04-04 MED ORDER — DM-GUAIFENESIN ER 30-600 MG PO TB12: 1.0000 | ORAL_TABLET | Freq: Two times a day (BID) | ORAL | Status: DC | PRN

## 2021-04-04 MED ORDER — VENLAFAXINE HCL ER 37.5 MG PO CP24
37.5000 mg | ORAL_CAPSULE | Freq: Every day | ORAL | Status: DC
  Administered 2021-04-05 – 2021-04-07 (×3): 37.5 mg via ORAL
  Filled 2021-04-04 (×3): qty 1

## 2021-04-04 MED ORDER — SODIUM CHLORIDE 0.9 % IV SOLN
2.0000 g | Freq: Once | INTRAVENOUS | Status: AC
  Administered 2021-04-04: 2 g via INTRAVENOUS
  Filled 2021-04-04: qty 20

## 2021-04-04 NOTE — ED Notes (Signed)
Pt with no urine output since arrival. Bladder scanned 132mL

## 2021-04-04 NOTE — ED Notes (Signed)
Pt daughter updated to the best of this RN ability

## 2021-04-04 NOTE — H&P (Addendum)
History and Physical    Adriana Martin XVQ:[id number] DOB: [birth year] DOA: 04/04/2021  Referring MD/NP/PA:   PCP: [name], MD   Patient coming from:  The patient is coming from home.   Chief Complaint: rectal bleeding  HPI: Adriana Martin is a 75 y.o. female with medical history significant of hypertension, HLD, COPD, hypothyroidism, depression, anxiety, pacemaker placement, cirrhosis, breast cancer (s/p of right mastectomy and radiation therapy), dementia, dCHF, atrial fibrillation on Eliquis, CKD stage IIIb, who presents with rectal bleeding.  Per her daughter (I called her daughter by phone), patient has had at least 2 episodes of rectal bleeding which is mixed with diarrhea.  Patient has had several episodes of diarrhea.  Patient has nausea and 1 episode of nonbilious nonbloody vomiting.  Patient does not have abdominal pain.  No fever or chills.  Patient has mild shortness of breath due to COPD, no cough, chest pain.  Denies symptoms of UTI.  Per her daughter, patient had a fall on 7/4 with head injury, patient was [facility] in [facility] clinic and had negative CT of head.  ED Course: pt was found to have WBC 17.7, hemoglobin 14.0, INR 1.2, PTT 28, pending COVID-19 PCR, temperature normal, blood pressure 98/54, which improved to 114/60 after giving 1 L LR in ED.  Heart rate 59, RR 19.  CT angiogram of abdomen/pelvis showed possible colitis of splenic flexure . Patient is placed on MedSurg bed for patient  CTA of abdomen/pelvis 1. No evidence of active GI bleeding. 2. Wall thickening in the splenic flexure of the colon suggesting colitis, nonspecific. Consider GI consultation. 3. Sigmoid diverticulosis   Review of Systems:   General: no fevers, chills, no body weight gain, has poor appetite, has fatigue HEENT: no blurry vision, hearing changes or sore throat Respiratory: no dyspnea, coughing, wheezing CV: no chest pain, no palpitations GI: has nausea, vomiting,  diarrhea,  rectal bleeding, no constipation or abdominal pain, GU: no dysuria, burning on urination, increased urinary frequency, hematuria  Ext: no leg edema.  Neuro: no unilateral weakness, numbness, or tingling, no vision change or hearing loss Skin: no rash, no skin tear. MSK: No muscle spasm, no deformity, no limitation of range of movement in spin Heme: No easy bruising.  Travel history: No recent long distant travel.  Allergy: No Known Allergies  Past Medical History:  Diagnosis Date   Atrial fibrillation (HCC)    Breast cancer (HCC)    Chronic diastolic CHF (congestive heart failure) (HCC)    Cirrhosis (HCC)    CKD (chronic kidney disease), stage III (HCC)    COPD (chronic obstructive pulmonary disease) (HCC)    GERD (gastroesophageal reflux disease)    HLD (hyperlipidemia)    HTN (hypertension)    Hypothyroid    Pacemaker     Past Surgical History:  Procedure Laterality Date   APPENDECTOMY     Mastectomy Right    PACEMAKER PLACEMENT N/A    VSD REPAIR N/A     Social History:  reports that she has never smoked. She has never used smokeless tobacco. She reports previous alcohol use. She reports that she does not use drugs.  Family History:  Family History  Problem Relation Age of Onset   Multiple myeloma Father    Esophageal cancer Brother      Prior to Admission medications   Not on File    Physical Exam: Vitals:   04/04/21 1100 04/04/21 1200 04/04/21 1230 04/04/21 1300  BP: 113/60 (!) 107/56 113/60 Marland Kitchen)  108/56  Pulse: 64 (!) 59 (!) 59 (!) 59  Resp: 12 17 19 16   Temp:      TempSrc:      SpO2: 100% 100% 98% 98%  Weight:      Height:       General: Not in acute distress HEENT:       Eyes: PERRL, EOMI, no scleral icterus.       ENT: No discharge from the ears and nose, no pharynx injection, no tonsillar enlargement.        Neck: No JVD, no bruit, no mass felt. Heme: No neck lymph node enlargement. Cardiac: S1/S2, RRR, No gallops or rubs. Respiratory: No  rales, wheezing, rhonchi or rubs. GI: Soft, nondistended, nontender, no rebound pain, no organomegaly, BS present. GU: No hematuria Ext: No pitting leg edema bilaterally. 1+DP/PT pulse bilaterally.  Has chronic venous insufficiency change in both legs Musculoskeletal: No joint deformities, No joint redness or warmth, no limitation of ROM in spin. Skin: No rashes.  Neuro: Alert, cranial nerves II-XII grossly intact, moves all extremities normally.  Psych: Patient is not psychotic, no suicidal or hemocidal ideation.  Labs on Admission: I have personally reviewed following labs and imaging studies  CBC: Recent Labs  Lab 04/04/21 0843 04/04/21 1326  WBC 17.7* 15.4*  NEUTROABS 16.0*  --   HGB 14.0 13.3  HCT 42.7 40.2  MCV 92.6 93.5  PLT 262 244   Basic Metabolic Panel: Recent Labs  Lab 04/04/21 0843  NA 137  K 4.7  CL 101  CO2 24  GLUCOSE 160*  BUN 51*  CREATININE 1.71*  CALCIUM 9.4   GFR: Estimated Creatinine Clearance: 27.3 mL/min (A) (by C-G formula based on SCr of 1.71 mg/dL (H)). Liver Function Tests: Recent Labs  Lab 04/04/21 0843  AST 49*  ALT 60*  ALKPHOS 62  BILITOT 0.7  PROT 6.8  ALBUMIN 3.4*   No results for input(s): LIPASE, AMYLASE in the last 168 hours. Recent Labs  Lab 04/04/21 1400  AMMONIA 13   Coagulation Profile: Recent Labs  Lab 04/04/21 0843  INR 1.2   Cardiac Enzymes: No results for input(s): CKTOTAL, CKMB, CKMBINDEX, TROPONINI in the last 168 hours. BNP (last 3 results) No results for input(s): PROBNP in the last 8760 hours. HbA1C: No results for input(s): HGBA1C in the last 72 hours. CBG: No results for input(s): GLUCAP in the last 168 hours. Lipid Profile: No results for input(s): CHOL, HDL, LDLCALC, TRIG, CHOLHDL, LDLDIRECT in the last 72 hours. Thyroid Function Tests: No results for input(s): TSH, T4TOTAL, FREET4, T3FREE, THYROIDAB in the last 72 hours. Anemia Panel: No results for input(s): VITAMINB12, FOLATE, FERRITIN,  TIBC, IRON, RETICCTPCT in the last 72 hours. Urine analysis: No results found for: COLORURINE, APPEARANCEUR, LABSPEC, PHURINE, GLUCOSEU, HGBUR, BILIRUBINUR, KETONESUR, PROTEINUR, UROBILINOGEN, NITRITE, LEUKOCYTESUR Sepsis Labs: @LABRCNTIP (procalcitonin:4,lacticidven:4) )No results found for this or any previous visit (from the past 240 hour(s)).   Radiological Exams on Admission: CT Angio Abd/Pel w/ and/or w/o  Result Date: 04/04/2021 CLINICAL DATA:  Rectal bleeding, Dementia, recent fall EXAM: CTA ABDOMEN AND PELVIS WITHOUT AND WITH CONTRAST TECHNIQUE: Multidetector CT imaging of the abdomen and pelvis was performed using the standard protocol during bolus administration of intravenous contrast. Multiplanar reconstructed images and MIPs were obtained and reviewed to evaluate the vascular anatomy. CONTRAST:  39m OMNIPAQUE IOHEXOL 350 MG/ML SOLN COMPARISON:  03/20/2013 FINDINGS: VASCULAR Aorta: Moderate calcified atheromatous plaque. No aneurysm, dissection, or stenosis. Celiac: Calcified ostial plaque without stenosis, patent distally within  remarkable branch anatomy. SMA: Ostial plaque extending over length of approximately 1.6 cm resulting in moderate stenosis. Vessels patent distally with classic branch anatomy. Renals: Single left, with proximal branching. No stenosis. Duplicated right, superior dominant, both patent. IMA: Patent without evidence of aneurysm, dissection, vasculitis or significant stenosis. Inflow: Mild plaque in bilateral common iliac arteries without stenosis. No dissection or aneurysm. Proximal Outflow: Bilateral common femoral and visualized portions of the superficial and profunda femoral arteries are patent without evidence of aneurysm, dissection, vasculitis or significant stenosis. Veins: Patent hepatic veins, portal vein, SMV, splenic vein, bilateral renal veins. Scattered mural calcifications in right common femoral and common iliac veins suggesting sequela of previous  central venous catheter or DVT. IVC is nondistended, unremarkable. Review of the MIP images confirms the above findings. NON-VASCULAR Lower chest: Cardiomegaly with biatrial enlargement. Transvenous pacing leads partially visualized. Mitral valve replacement. No pleural or pericardial effusion. Hepatobiliary: No focal liver abnormality is seen. No gallstones, gallbladder wall thickening, or biliary dilatation. Pancreas: Unremarkable. No pancreatic ductal dilatation or surrounding inflammatory changes. Spleen: Normal in size without focal abnormality. Adrenals/Urinary Tract: Adrenal glands unremarkable. Symmetric renal enhancement. No hydronephrosis or urolithiasis. Urinary bladder incompletely distended. Stomach/Bowel: Stomach and small bowel are nondistended. Appendix not identified. No pericecal inflammatory change. The colon is nondilated. Suspect mild circumferential wall thickening in the splenic flexure with mild adjacent inflammatory change. Scattered sigmoid diverticula without adjacent inflammatory change. No evidence of active extravasation. Lymphatic: No abdominal or pelvic adenopathy. Reproductive: Status post hysterectomy. No adnexal masses. Other: Bilateral pelvic phleboliths.  No ascites.  No free air. Musculoskeletal: Chronic T8 and L1 vertebral body compression deformities stable since 07/27/2019. Stable grade 1 anterolisthesis L4-5 without pars defect. No acute fracture or aggressive bone lesion. IMPRESSION: 1. No evidence of active GI bleeding. 2. Wall thickening in the splenic flexure of the colon suggesting colitis, nonspecific. Consider GI consultation. 3. Sigmoid diverticulosis Electronically Signed   By: Lucrezia Europe M.D.   On: 04/04/2021 12:02     EKG: I have personally reviewed.  Seems to be paced rhythm, QTC 514, bifascicular block  Principal Problem:   Acute colitis Active Problems:   Acute renal failure superimposed on stage 3b chronic kidney disease (HCC)   HTN (hypertension)    Atrial fibrillation, chronic (HCC)   Chronic diastolic CHF (congestive heart failure) (HCC)   Rectal bleeding   COPD (chronic obstructive pulmonary disease) (HCC)   HLD (hyperlipidemia)   Hypothyroid   Cirrhosis (HCC)   Breast cancer (HCC)   Acute colitis and rectal bleeding: Rectal bleeding is most likely due to acute colitis.  CT angiogram is negative for active bleeding..  Hemoglobin stable, 14.0 -->13.3.  Patient also has diarrhea.  We need to rule out C. difficile.  -Placed on MedSurg bed for observation -IV Zosyn (patient received 1 dose of Rocephin and Flagyl in ED) -IV fluid: 1 L LR -f/u C diff test and GI pathogen panel  Acute renal failure superimposed on stage 3b chronic kidney disease ([address]): Recently baseline creatinine 1.1-1.3.  Her creatinine is 1.71, BUN 51, likely due to dehydration and continuation of diuretics and lisinopril -Hold Lasix and lisinopril -IV fluid as above -Follow-up with BMP  HTN -IV hydralazine as needed -Hold lisinopril, Lasix and Cardizem due to softer blood pressure  Atrial fibrillation, chronic ([place]): Heart rate 59 -Hold Cardizem due to softer blood pressure -Hold Eliquis due to rectal bleeding  Chronic diastolic CHF (congestive heart failure) ([place]): 2D echo on 03/11/2020 showed EF of 55-60%.  Patient does not have leg edema.  CHF seem to be compensated. -Check BNP --> 268 -hold lasix  COPD: stable -Bronchodilators  HLD (hyperlipidemia) -Pravastatin, Zetia  Hypothyroid -Synthroid  Cirrhosis ([place]): AST 49, ALT 60, total improving 0.7, ALP 62. -Check INR and PTT -Check ammonia level  Breast cancer (HCC) -Continue tamoxifen  Dementia: No behavioral disturbance -Donepezil  Prednisone use: pt is on 5 mg prednisone daily, but not sure why patient is taking this medication.  Her daughter also does not know why patient is taking this medication. -Will increase prednisone from 5 to 15 mg daily due to stress -will give 50 mg of  Solu-Cortef for stress dose   DVT ppx: SCD Code Status: Full code Family Communication: Yes, patient's daughter by phone Disposition Plan:  Anticipate discharge back to previous environment Consults called:  none Admission status and Level of care: Med-Surg: for obs   Status is: Observation  The patient remains OBS appropriate and will d/c before 2 midnights.  Dispo: The patient is from: Home              Anticipated d/c is to: Home              Patient currently is not medically stable to d/c.   Difficult to place patient No          Date of Service 04/04/2021    [facility]   If 7PM-7AM, please contact night-coverage [email or web address] 04/04/2021, 3:18 PM

## 2021-04-04 NOTE — ED Notes (Signed)
Pt. Is now in A-flutter, EKG obtained, MD notified. Pt. Is asymptomatic, will continue to monitor and await any orders.

## 2021-04-04 NOTE — ED Provider Notes (Signed)
[facility] Emergency Department Provider Note  ____________________________________________  Time seen: Approximately 10:40 AM  I have reviewed the triage vital signs and the nursing notes.   HISTORY  Chief Complaint Rectal Bleeding    Level 5 Caveat: Portions of the History and Physical including HPI and review of systems are unable to be completely obtained due to confusion related to chronic dementia  HPI Adriana Martin is a 75 y.o. female with a past history of dementia, breast cancer, CHF, atrial fibrillation, hypertension is brought to the ED due to rectal bleeding this morning.  Patient denies any pain, no shortness of breath or dizziness.  She had a fall on July 4 with blunt head trauma, seen at [facility] with negative CT imaging.  She is reportedly been in usual state of health until this morning when she had an episode of red bloody stool.  Patient is on Eliquis for her atrial fibrillation.  Patient denies abdominal pain    No past medical history on file.   Patient Active Problem List   Diagnosis Date Noted  . Acute colitis 04/04/2021  . CKD (chronic kidney disease), stage IIIb 04/04/2021  . HTN (hypertension) 04/04/2021  . Atrial fibrillation, chronic ([place]) 04/04/2021  . CHF (congestive heart failure) ([place]) 04/04/2021  . Rectal bleeding 04/04/2021        Prior to Admission medications   Not on File  Diltiazem, Eliquis, tamoxifen, diuretics   Allergies Patient has no allergy information on record.   No family history on file.  Social History    Review of Systems Level 5 Caveat: Portions of the History and Physical including HPI and review of systems are unable to be completely obtained due to patient being a poor historian   Constitutional:   No known fever.  ENT:   No rhinorrhea. Cardiovascular:   No chest pain or syncope. Respiratory:   No dyspnea or cough. Gastrointestinal:   Negative for abdominal pain,  vomiting and diarrhea.  Positive rectal bleeding Musculoskeletal:   Negative for focal pain or swelling ____________________________________________   PHYSICAL EXAM:  VITAL SIGNS: ED Triage Vitals  Enc Vitals Group     BP 04/04/21 0840 (!) 98/54     Pulse Rate 04/04/21 0840 65     Resp 04/04/21 0840 17     Temp 04/04/21 0840 98.6 F (37 C)     Temp Source 04/04/21 0840 Oral     SpO2 04/04/21 0840 99 %     Weight 04/04/21 0837 170 lb (77.1 kg)     Height 04/04/21 0837 5\' 2"  (1.575 m)     Head Circumference --      Peak Flow --      Pain Score 04/04/21 0837 0     Pain Loc --      Pain Edu? --      Excl. in [place]? --     Vital signs reviewed, nursing assessments reviewed.   Constitutional:   Alert and oriented to self. Non-toxic appearance. Eyes:   Conjunctivae are pale. EOMI. PERRL. ENT      Head:   Normocephalic and atraumatic.      Nose:   No congestion/rhinnorhea.       Mouth/Throat:   MMM, no pharyngeal erythema. No peritonsillar mass.       Neck:   No meningismus. Full ROM. Hematological/Lymphatic/Immunilogical:   No cervical lymphadenopathy. Cardiovascular:   RRR. Symmetric bilateral radial and DP pulses.  No murmurs. Cap refill less than 2  seconds. Respiratory:   Normal respiratory effort without tachypnea/retractions. Breath sounds are clear and equal bilaterally. No wheezes/rales/rhonchi. Gastrointestinal:   Soft and nontender. Non distended. There is no CVA tenderness.  No rebound, rigidity, or guarding.  Rectal exam shows trace red blood in the vault.  No melanotic stool.  Hemoccult positive.  No bleeding hemorrhoids. Genitourinary:   deferred Musculoskeletal:   Normal range of motion in all extremities. No joint effusions.  No lower extremity tenderness.  No edema. Neurologic:   Normal speech and language.  Motor grossly intact. No acute focal neurologic deficits are appreciated.  Skin:    Skin is warm, dry and intact. No rash noted.  No petechiae, purpura, or  bullae.  ____________________________________________    LABS (pertinent positives/negatives) (all labs ordered are listed, but only abnormal results are displayed) Labs Reviewed  COMPREHENSIVE METABOLIC PANEL - Abnormal; Notable for the following components:      Result Value   Glucose, Bld 160 (*)    BUN 51 (*)    Creatinine, Ser 1.71 (*)    Albumin 3.4 (*)    AST 49 (*)    ALT 60 (*)    GFR, Estimated 31 (*)    All other components within normal limits  CBC WITH DIFFERENTIAL/PLATELET - Abnormal; Notable for the following components:   WBC 17.7 (*)    Neutro Abs 16.0 (*)    All other components within normal limits  HEPARIN LEVEL (UNFRACTIONATED) - Abnormal; Notable for the following components:   Heparin Unfractionated >1.10 (*)    All other components within normal limits  PROTIME-INR  APTT  CBC  CBC  CBC  BRAIN NATRIURETIC PEPTIDE  TYPE AND SCREEN   ____________________________________________   EKG  Interpreted by me Atrial fibrillation, rate of 68.  Right axis, right bundle branch block.  No acute ischemic changes.  ____________________________________________    RADIOLOGY  CT Angio Abd/Pel w/ and/or w/o  Result Date: 04/04/2021 CLINICAL DATA:  Rectal bleeding, Dementia, recent fall EXAM: CTA ABDOMEN AND PELVIS WITHOUT AND WITH CONTRAST TECHNIQUE: Multidetector CT imaging of the abdomen and pelvis was performed using the standard protocol during bolus administration of intravenous contrast. Multiplanar reconstructed images and MIPs were obtained and reviewed to evaluate the vascular anatomy. CONTRAST:  78mL OMNIPAQUE IOHEXOL 350 MG/ML SOLN COMPARISON:  03/20/2013 FINDINGS: VASCULAR Aorta: Moderate calcified atheromatous plaque. No aneurysm, dissection, or stenosis. Celiac: Calcified ostial plaque without stenosis, patent distally within remarkable branch anatomy. SMA: Ostial plaque extending over length of approximately 1.6 cm resulting in moderate stenosis.  Vessels patent distally with classic branch anatomy. Renals: Single left, with proximal branching. No stenosis. Duplicated right, superior dominant, both patent. IMA: Patent without evidence of aneurysm, dissection, vasculitis or significant stenosis. Inflow: Mild plaque in bilateral common iliac arteries without stenosis. No dissection or aneurysm. Proximal Outflow: Bilateral common femoral and visualized portions of the superficial and profunda femoral arteries are patent without evidence of aneurysm, dissection, vasculitis or significant stenosis. Veins: Patent hepatic veins, portal vein, SMV, splenic vein, bilateral renal veins. Scattered mural calcifications in right common femoral and common iliac veins suggesting sequela of previous central venous catheter or DVT. IVC is nondistended, unremarkable. Review of the MIP images confirms the above findings. NON-VASCULAR Lower chest: Cardiomegaly with biatrial enlargement. Transvenous pacing leads partially visualized. Mitral valve replacement. No pleural or pericardial effusion. Hepatobiliary: No focal liver abnormality is seen. No gallstones, gallbladder wall thickening, or biliary dilatation. Pancreas: Unremarkable. No pancreatic ductal dilatation or surrounding inflammatory changes. Spleen: Normal  in size without focal abnormality. Adrenals/Urinary Tract: Adrenal glands unremarkable. Symmetric renal enhancement. No hydronephrosis or urolithiasis. Urinary bladder incompletely distended. Stomach/Bowel: Stomach and small bowel are nondistended. Appendix not identified. No pericecal inflammatory change. The colon is nondilated. Suspect mild circumferential wall thickening in the splenic flexure with mild adjacent inflammatory change. Scattered sigmoid diverticula without adjacent inflammatory change. No evidence of active extravasation. Lymphatic: No abdominal or pelvic adenopathy. Reproductive: Status post hysterectomy. No adnexal masses. Other: Bilateral pelvic  phleboliths.  No ascites.  No free air. Musculoskeletal: Chronic T8 and L1 vertebral body compression deformities stable since 07/27/2019. Stable grade 1 anterolisthesis L4-5 without pars defect. No acute fracture or aggressive bone lesion. IMPRESSION: 1. No evidence of active GI bleeding. 2. Wall thickening in the splenic flexure of the colon suggesting colitis, nonspecific. Consider GI consultation. 3. Sigmoid diverticulosis Electronically Signed   By: Lucrezia Europe M.D.   On: 04/04/2021 12:02    ____________________________________________   PROCEDURES Procedures  ____________________________________________  DIFFERENTIAL DIAGNOSIS   GI bleed, dehydration, electrolyte abnormality, anemia, colitis  CLINICAL IMPRESSION / ASSESSMENT AND PLAN / ED COURSE  Medications ordered in the ED: Medications  iohexol (OMNIPAQUE) 350 MG/ML injection 75 mL (has no administration in time range)  cefTRIAXone (ROCEPHIN) 2 g in sodium chloride 0.9 % 100 mL IVPB (2 g Intravenous New Bag/Given 04/04/21 1311)  acetaminophen (TYLENOL) tablet 650 mg (has no administration in time range)  hydrALAZINE (APRESOLINE) injection 5 mg (has no administration in time range)  piperacillin-tazobactam (ZOSYN) IVPB 3.375 g (has no administration in time range)  lactated ringers bolus 1,000 mL (1,000 mLs Intravenous New Bag/Given 04/04/21 1006)  iohexol (OMNIPAQUE) 350 MG/ML injection 75 mL (75 mLs Intravenous Contrast Given 04/04/21 1021)    Pertinent labs & imaging results that were available during my care of the patient were reviewed by me and considered in my medical decision making (see chart for details).   Alvita Fana was evaluated in Emergency Department on 04/04/2021 for the symptoms described in the history of present illness. She was evaluated in the context of the global COVID-19 pandemic, which necessitated consideration that the patient might be at risk for infection with the SARS-CoV-2 virus that causes  COVID-19. Institutional protocols and algorithms that pertain to the evaluation of patients at risk for COVID-19 are in a state of rapid change based on information released by regulatory bodies including the CDC and federal and state organizations. These policies and algorithms were followed during the patient's care in the ED.   Patient presents with an episode of rectal bleeding, has some red blood present on rectal exam.  Abdomen is benign.  Vital signs show borderline low blood pressure, but otherwise normal.  Will give IV fluids for hydration, check labs.  ----------------------------------------- 10:47 AM on 04/04/2021 ----------------------------------------- Labs show hemoglobin of 14.  There is leukocytosis of 17,000, relatively high BUN compared to creatinine, possibly due to GI bleed.  Will obtain CTA abdomen pelvis to further evaluate for active bleed, colitis, diverticulitis.  Clinical Course as of 04/04/21 1335  Fri Apr 04, 2021  1312 CT negative for identifiable GI hemorrhage, but does show signs of colitis at the splenic flexure which could be infectious versus ischemic.  We will continue IV fluid hydration, cover with antibiotics,, hospitalize for continued monitoring with Eliquis use.  She is on Eliquis due to atrial fibrillation and prior stroke.    Care discussed with patient's daughter, Casimira Sutphin, who agrees with hospitalization plan. [PS]    Clinical  Course User Index [PS] Carrie Mew, MD     ____________________________________________   FINAL CLINICAL IMPRESSION(S) / ED DIAGNOSES    Final diagnoses:  Colitis with rectal bleeding  Chronic dementia without behavioral disturbance [facility])  Atrial fibrillation, unspecified type [facility])     ED Discharge Orders     None       Portions of this note were generated with dragon dictation software. Dictation errors may occur despite best attempts at proofreading.   Carrie Mew, MD 04/04/21 1334

## 2021-04-04 NOTE — ED Triage Notes (Signed)
Pt to ED [facility] for rectal bleeding x1 , noticed by daughter today, pt hx dementia. Pt has bruising noted to left face. Recent fall on 7/4 with jaw injury Takes eliquis   Pt poor historian, not able to answer all questions

## 2021-04-04 NOTE — Progress Notes (Signed)
Pharmacy Antibiotic Note  Adriana Martin is a 75 y.o. female admitted on 04/04/2021. Pharmacy has been consulted for Zosyn dosing.  Plan: Zosyn 3.375 g IV q8h extended infusion  Height: 5\' 2"  (157.5 cm) Weight: 77.1 kg (170 lb) IBW/kg (Calculated) : 50.1  Temp (24hrs), Avg:98.6 F (37 C), Min:98.6 F (37 C), Max:98.6 F (37 C)  Recent Labs  Lab 04/04/21 0843  WBC 17.7*  CREATININE 1.71*    Estimated Creatinine Clearance: 27.3 mL/min (A) (by C-G formula based on SCr of 1.71 mg/dL (H)).    Not on File  Antimicrobials this admission: Ceftriaxone 7/15 x 1 Zosyn 7/15 >>    Thank you for allowing pharmacy to be a part of this patient's care.  Tawnya Crook, PharmD 04/04/2021 1:29 PM

## 2021-04-04 NOTE — ED Notes (Signed)
Bed alarm in place. Call bell in reach, stretcher locked in lowest position

## 2021-04-04 NOTE — ED Notes (Signed)
Antibiotics started before blood cultures ordered

## 2021-04-04 NOTE — ED Notes (Signed)
Patient eating dinner at this time. No other needs.

## 2021-04-05 ENCOUNTER — Other Ambulatory Visit: Payer: Self-pay

## 2021-04-05 ENCOUNTER — Observation Stay (HOSPITAL_COMMUNITY)
Admit: 2021-04-05 | Discharge: 2021-04-05 | Disposition: A | Discharge status: Home / Self Care | Payer: Medicare Other | Attending: Internal Medicine | Admitting: Internal Medicine

## 2021-04-05 DIAGNOSIS — E669 Obesity, unspecified: Secondary | ICD-10-CM | POA: Diagnosis present

## 2021-04-05 DIAGNOSIS — K529 Noninfective gastroenteritis and colitis, unspecified: Secondary | ICD-10-CM | POA: Diagnosis present

## 2021-04-05 DIAGNOSIS — I5032 Chronic diastolic (congestive) heart failure: Secondary | ICD-10-CM | POA: Diagnosis present

## 2021-04-05 DIAGNOSIS — E785 Hyperlipidemia, unspecified: Secondary | ICD-10-CM | POA: Diagnosis present

## 2021-04-05 DIAGNOSIS — N179 Acute kidney failure, unspecified: Secondary | ICD-10-CM | POA: Diagnosis present

## 2021-04-05 DIAGNOSIS — Z9011 Acquired absence of right breast and nipple: Secondary | ICD-10-CM | POA: Diagnosis not present

## 2021-04-05 DIAGNOSIS — I4892 Unspecified atrial flutter: Secondary | ICD-10-CM

## 2021-04-05 DIAGNOSIS — F32A Depression, unspecified: Secondary | ICD-10-CM | POA: Diagnosis present

## 2021-04-05 DIAGNOSIS — F0390 Unspecified dementia without behavioral disturbance: Secondary | ICD-10-CM | POA: Diagnosis present

## 2021-04-05 DIAGNOSIS — Z6831 Body mass index (BMI) 31.0-31.9, adult: Secondary | ICD-10-CM | POA: Diagnosis not present

## 2021-04-05 DIAGNOSIS — J449 Chronic obstructive pulmonary disease, unspecified: Secondary | ICD-10-CM | POA: Diagnosis present

## 2021-04-05 DIAGNOSIS — K5730 Diverticulosis of large intestine without perforation or abscess without bleeding: Secondary | ICD-10-CM | POA: Diagnosis present

## 2021-04-05 DIAGNOSIS — F419 Anxiety disorder, unspecified: Secondary | ICD-10-CM | POA: Diagnosis present

## 2021-04-05 DIAGNOSIS — E039 Hypothyroidism, unspecified: Secondary | ICD-10-CM | POA: Diagnosis present

## 2021-04-05 DIAGNOSIS — I130 Hypertensive heart and chronic kidney disease with heart failure and stage 1 through stage 4 chronic kidney disease, or unspecified chronic kidney disease: Secondary | ICD-10-CM | POA: Diagnosis present

## 2021-04-05 DIAGNOSIS — Z950 Presence of cardiac pacemaker: Secondary | ICD-10-CM | POA: Diagnosis not present

## 2021-04-05 DIAGNOSIS — Z800 Family history of malignant neoplasm of digestive organs: Secondary | ICD-10-CM | POA: Diagnosis not present

## 2021-04-05 DIAGNOSIS — Z7901 Long term (current) use of anticoagulants: Secondary | ICD-10-CM | POA: Diagnosis not present

## 2021-04-05 DIAGNOSIS — K219 Gastro-esophageal reflux disease without esophagitis: Secondary | ICD-10-CM | POA: Diagnosis present

## 2021-04-05 DIAGNOSIS — Z20822 Contact with and (suspected) exposure to covid-19: Secondary | ICD-10-CM | POA: Diagnosis present

## 2021-04-05 DIAGNOSIS — K7460 Unspecified cirrhosis of liver: Secondary | ICD-10-CM | POA: Diagnosis present

## 2021-04-05 DIAGNOSIS — Z807 Family history of other malignant neoplasms of lymphoid, hematopoietic and related tissues: Secondary | ICD-10-CM | POA: Diagnosis not present

## 2021-04-05 DIAGNOSIS — I482 Chronic atrial fibrillation, unspecified: Secondary | ICD-10-CM | POA: Diagnosis present

## 2021-04-05 DIAGNOSIS — N1832 Chronic kidney disease, stage 3b: Secondary | ICD-10-CM | POA: Diagnosis present

## 2021-04-05 DIAGNOSIS — C50911 Malignant neoplasm of unspecified site of right female breast: Secondary | ICD-10-CM | POA: Diagnosis present

## 2021-04-05 LAB — CBC
HCT: 38.5 % (ref 36.0–46.0)
HCT: 37.3 % (ref 36.0–46.0)
Hemoglobin: 12.5 g/dL (ref 12.0–15.0)
Hemoglobin: 12.3 g/dL (ref 12.0–15.0)
MCH: 30.3 pg (ref 26.0–34.0)
MCH: 32 pg (ref 26.0–34.0)
MCHC: 32.5 g/dL (ref 30.0–36.0)
MCHC: 33 g/dL (ref 30.0–36.0)
MCV: 93.2 fL (ref 80.0–100.0)
MCV: 97.1 fL (ref 80.0–100.0)
Platelets: 223 10*3/uL (ref 150–400)
Platelets: 182 K/uL (ref 150–400)
RBC: 4.13 MIL/uL (ref 3.87–5.11)
RBC: 3.84 MIL/uL — ABNORMAL LOW (ref 3.87–5.11)
RDW: 14.3 % (ref 11.5–15.5)
RDW: 14.6 % (ref 11.5–15.5)
WBC: 16.8 10*3/uL — ABNORMAL HIGH (ref 4.0–10.5)
WBC: 14 K/uL — ABNORMAL HIGH (ref 4.0–10.5)
nRBC: 0 % (ref 0.0–0.2)
nRBC: 0 % (ref 0.0–0.2)

## 2021-04-05 LAB — BASIC METABOLIC PANEL WITH GFR
Anion gap: 6 (ref 5–15)
BUN: 43 mg/dL — ABNORMAL HIGH (ref 8–23)
CO2: 24 mmol/L (ref 22–32)
Calcium: 8.5 mg/dL — ABNORMAL LOW (ref 8.9–10.3)
Chloride: 110 mmol/L (ref 98–111)
Creatinine, Ser: 1.35 mg/dL — ABNORMAL HIGH (ref 0.44–1.00)
GFR, Estimated: 41 mL/min — ABNORMAL LOW
Glucose, Bld: 117 mg/dL — ABNORMAL HIGH (ref 70–99)
Potassium: 5.1 mmol/L (ref 3.5–5.1)
Sodium: 140 mmol/L (ref 135–145)

## 2021-04-05 LAB — ECHOCARDIOGRAM COMPLETE
AR max vel: 1.22 cm2
AV Area VTI: 1.44 cm2
AV Area mean vel: 1.28 cm2
AV Mean grad: 6 mmHg
AV Peak grad: 13.7 mmHg
Ao pk vel: 1.85 m/s
Area-P 1/2: 0.96 cm2
Height: 62 in
MV VTI: 0.32 cm2
S' Lateral: 3 cm
Weight: 2624.36 [oz_av]

## 2021-04-05 MED ORDER — LACTATED RINGERS IV SOLN: INTRAVENOUS | Status: DC

## 2021-04-05 MED ORDER — SODIUM CHLORIDE 0.9 % IV SOLN
INTRAVENOUS | Status: DC | PRN
  Administered 2021-04-05 – 2021-04-06 (×2): 500 mL via INTRAVENOUS

## 2021-04-05 MED ORDER — ACETAMINOPHEN 325 MG PO TABS: 650.0000 mg | ORAL_TABLET | Freq: Four times a day (QID) | ORAL | Status: DC | PRN

## 2021-04-05 MED ORDER — LEVALBUTEROL HCL 0.63 MG/3ML IN NEBU
0.6300 mg | INHALATION_SOLUTION | Freq: Four times a day (QID) | RESPIRATORY_TRACT | Status: DC | PRN
  Filled 2021-04-05: qty 3

## 2021-04-05 MED ORDER — SODIUM CHLORIDE 0.9 % IV SOLN
3.0000 g | Freq: Four times a day (QID) | INTRAVENOUS | Status: DC
  Administered 2021-04-05 – 2021-04-07 (×8): 3 g via INTRAVENOUS
  Filled 2021-04-05 (×5): qty 3
  Filled 2021-04-05 (×4): qty 8
  Filled 2021-04-05 (×3): qty 3
  Filled 2021-04-05: qty 8

## 2021-04-05 NOTE — Care Management Important Message (Deleted)
Important Message  Patient Details  Name: Adriana Martin MRN: [id number] Date of Birth: 1945-10-07   Medicare Important Message Given:  Yes     [name], LCSW 04/05/2021, 4:11 PM

## 2021-04-05 NOTE — Progress Notes (Signed)
[facility] Progress Note  Patient: Adriana Martin    JSH:[id number]  DOA: 04/04/2021     Date of Service: the patient was seen and examined on 04/05/2021  Brief hospital course: PMH of HTN, HLD, COPD, hypothyroidism, depression, anxiety, PPM implant, cirrhosis, breast cancer SP mastectomy, dementia, HFpEF, chronic A. fib on Eliquis, CKD 3B.  Presents with complaints of rectal bleeding found to have colitis. Currently plan is continue IV antibiotics.  Monitor..  Subjective: Abdominal pain improving.  No nausea no vomiting.  No fever no chills.  No bleeding.  So far no bowel movement in the hospital.  Assessment and Plan: 1.  Acute colitis CT angiogram negative for any acute bleeding. Shows evidence of colitis. Initially started on IV Zosyn. We will switch to IV Unasyn. Monitor. Currently no bowel movement, C. difficile and GI pathogen panel pending. Advance to full liquid diet to soft diet.  2.  Hematochezia Reportedly at home. Per daughter no BM. H&H relatively stable. Monitor.  3.  AKI on CKD 3B Baseline serum creatinine around 1.1-1.3. On admission 1.71. Currently receiving IV hydration. Hold Lasix and lisinopril.  4.  HTN Blood pressure stable. Holding blood pressure medication.  5.  Chronic A. fib HR stable. Currently holding Cardizem. Also holding Eliquis for now.  6.  HFpEF Appears to be euvolemic. Currently actually receiving IV hydration. Monitor. Holding Lasix.  7. COPD: stable -Bronchodilators   8. HLD (hyperlipidemia) -Pravastatin, Zetia   9. Hypothyroid -Synthroid   10. Cirrhosis ([place]): AST 49, ALT 60, total improving 0.7, ALP 62. Normal INR and PTT -Normal ammonia level   11. Breast cancer ([place]) -Continue tamoxifen   12. Dementia: No behavioral disturbance -Donepezil   13. Prednisone use: pt is on 5 mg prednisone daily, but not sure why patient is taking this medication.  Her daughter also does not know why patient is taking  this medication. -Will increase prednisone from 5 to 15 mg daily due to stress  Scheduled Meds:  donepezil  5 mg Oral QHS   ezetimibe  10 mg Oral Daily   levothyroxine  50 mcg Oral QAC breakfast   mometasone-formoterol  2 puff Inhalation BID   multivitamin with minerals  1 tablet Oral Daily   predniSONE  15 mg Oral Daily   tamoxifen  20 mg Oral Daily   ursodiol  600 mg Oral BID   venlafaxine XR  37.5 mg Oral Daily   Continuous Infusions:  ampicillin-sulbactam (UNASYN) IV 3 g (04/05/21 1721)   lactated ringers 75 mL/hr at 04/05/21 1225   PRN Meds: acetaminophen, ondansetron (ZOFRAN) IV  Body mass index is 30 kg/m.        DVT Prophylaxis:   SCDs Start: 04/04/21 1329    Advance goals of care discussion: Pt is Full code.  Family Communication: no family was present at bedside, at the time of interview.   Data Reviewed: I have personally reviewed and interpreted daily labs, tele strips, imaging. WBC elevated but improving.  Hemoglobin stable.  Serum creatinine elevated but improving as well.  Mild elevation in serum potassium.  Physical Exam:  General: Appear in mild distress, no Rash; Oral Mucosa Clear, moist. no Abnormal Neck Mass Or lumps, Conjunctiva normal  Cardiovascular: S1 and S2 Present, no Murmur, Respiratory: good respiratory effort, Bilateral Air entry present and CTA, no Crackles, no wheezes Abdomen: Bowel Sound present, Soft and no tenderness Extremities: no Pedal edema Neurology: alert and oriented to place, and person affect appropriate. no new focal deficit Gait  not checked due to patient safety concerns    Vitals:   04/04/21 2120 04/05/21 0423 04/05/21 0808 04/05/21 1545  BP: (!) 114/55 (!) 109/51 135/61 140/72  Pulse: 61 (!) 59 60 60  Resp: 20 18    Temp: 98 F (36.7 C) 98 F (36.7 C) (!) 97.5 F (36.4 C) 97.9 F (36.6 C)  TempSrc: Oral Oral  Oral  SpO2: 97% 94% 100% 97%  Weight: 74.1 kg 74.4 kg    Height:        Disposition:  Status is:  Inpatient  Remains inpatient appropriate because:IV treatments appropriate due to intensity of illness or inability to take PO  Dispo: The patient is from: Home              Anticipated d/c is to: Home              Patient currently is not medically stable to d/c.   Difficult to place patient No  Time spent: 35 minutes. I reviewed all nursing notes, pharmacy notes, vitals, pertinent old records. I have discussed plan of care as described above with RN.  Author: Berle Mull, MD [facility] 04/05/2021 6:40 PM  To reach On-call, see care teams to locate the attending and reach out via [email or web address]. Between 7PM-7AM, please contact night-coverage If you still have difficulty reaching the attending provider, please page the [facility] (Director on Call) for [facility] on amion for assistance.

## 2021-04-05 NOTE — Progress Notes (Signed)
Pharmacy Antibiotic Note  Adriana Martin is a 75 y.o. female with medical history significant of hypertension, HLD, COPD, hypothyroidism, depression, anxiety, pacemaker placement, cirrhosis, breast cancer (s/p of right mastectomy and radiation therapy), dementia, dCHF, atrial fibrillation on Eliquis, CKD stage IIIb, who presents with rectal bleeding. Pt had received a one time dose of ceftriaxone and was previously on Zosyn. Pharmacy has been consulted for Unasyn dosing for intraabdominal infection.   WBC: 16.8>14, afebrile 7/15 CT abdomen: Wall thickening in the splenic flexure of the colon suggesting colitis, nonspecific. Sigmoid diverticulosis  Plan: Unasyn 3g IV q6h Monitor renal function and adjust dose as clinically indicated  Height: 5\' 2"  (157.5 cm) Weight: 74.4 kg (164 lb 0.4 oz) IBW/kg (Calculated) : 50.1  Temp (24hrs), Avg:97.8 F (36.6 C), Min:97.5 F (36.4 C), Max:98 F (36.7 C)  Recent Labs  Lab 04/04/21 0843 04/04/21 1326 04/04/21 1345 04/04/21 1955 04/05/21 0120 04/05/21 0353 04/05/21 0652  WBC 17.7* 15.4*  --  16.8* 16.8*  --  14.0*  CREATININE 1.71*  --   --   --   --  1.35*  --   LATICACIDVEN  --   --  1.6  --   --   --   --      Estimated Creatinine Clearance: 34 mL/min (A) (by C-G formula based on SCr of 1.35 mg/dL (H)).    Allergies  Allergen Reactions   Calcium Carbonate    Fluoxetine    Hydrochlorothiazide     Antimicrobials this admission: Ceftriaxone 7/15 x 1 Zosyn 7/15 >> 7/16  Microbiology: 7/15 Bcx: NG <24h GI PCR sent  Thank you for allowing pharmacy to be a part of this patient's care.  Sherilyn Banker, PharmD Clinical Pharmacist 04/05/2021 11:43 AM

## 2021-04-05 NOTE — Care Management Obs Status (Signed)
[place] NOTIFICATION   Patient Details  Name: Adriana Martin MRN: [id number] Date of Birth: 29-Nov-1945   Medicare Observation Status Notification Given:  Yes Reviewed via phone due to isolation status.   [place], LCSW 04/05/2021, 4:11 PM

## 2021-04-05 NOTE — Progress Notes (Signed)
*  PRELIMINARY RESULTS* Echocardiogram 2D Echocardiogram has been performed.  Adriana Martin 04/05/2021, 11:46 AM

## 2021-04-06 LAB — BASIC METABOLIC PANEL
Anion gap: 7 (ref 5–15)
BUN: 29 mg/dL — ABNORMAL HIGH (ref 8–23)
CO2: 24 mmol/L (ref 22–32)
Calcium: 8.2 mg/dL — ABNORMAL LOW (ref 8.9–10.3)
Chloride: 106 mmol/L (ref 98–111)
Creatinine, Ser: 1.18 mg/dL — ABNORMAL HIGH (ref 0.44–1.00)
GFR, Estimated: 48 mL/min — ABNORMAL LOW (ref >60)
Glucose, Bld: 95 mg/dL (ref 70–99)
Potassium: 3.9 mmol/L (ref 3.5–5.1)
Sodium: 137 mmol/L (ref 135–145)

## 2021-04-06 LAB — CBC
HCT: 31.5 % — ABNORMAL LOW (ref 36.0–46.0)
Hemoglobin: 10.6 g/dL — ABNORMAL LOW (ref 12.0–15.0)
MCH: 31.3 pg (ref 26.0–34.0)
MCHC: 33.7 g/dL (ref 30.0–36.0)
MCV: 92.9 fL (ref 80.0–100.0)
Platelets: 198 10*3/uL (ref 150–400)
RBC: 3.39 MIL/uL — ABNORMAL LOW (ref 3.87–5.11)
RDW: 14.4 % (ref 11.5–15.5)
WBC: 11.6 10*3/uL — ABNORMAL HIGH (ref 4.0–10.5)
nRBC: 0 % (ref 0.0–0.2)

## 2021-04-06 LAB — T4, FREE: Free T4: 0.77 ng/dL (ref 0.61–1.12)

## 2021-04-06 LAB — TSH: TSH: 0.382 u[IU]/mL (ref 0.350–4.500)

## 2021-04-06 NOTE — Progress Notes (Signed)
Initial Nutrition Assessment  DOCUMENTATION CODES:  Not applicable  INTERVENTION:  Continue current diet as tolerated, encourage PO intake Continue MVI with minerals daily Monitor intake trends for need for nutrition supplements  NUTRITION DIAGNOSIS:  Altered GI function related to diarrhea (and rectal bleeding) as evidenced by per patient/family report.  GOAL:  Patient will meet greater than or equal to 90% of their needs  MONITOR:  PO intake, Labs, I & O's  REASON FOR ASSESSMENT:  Malnutrition Screening Tool    ASSESSMENT:  75 y.o. female with a past history of dementia, COPD, GERD, CKD3, cirrhosis, breast cancer, CHF, atrial fibrillation, HLD, and HTN brought to the ED due to rectal bleeding  Imaging in ED suggestive of colitis, no evidence of active bleeding. Pt had diarrhea PTA, C.diff negative. AKI on admission, showing improvements with hydration.  Pt unable to provide a nutrition hx due to dementia. Limited intake recorded but diet advanced to a soft diet yesterday after tolerating a full liquids. No significant weight loss seen in hx, appears overall stable with ~6% weight loss noted in the last year. Noted that pt lives alone, but family lives nearby and provide care.    Average Meal Intake: 7/15-7/17: 75% intake x 1 recorded meal  Nutritionally Relevant Medications: Scheduled Meds:  ezetimibe  10 mg Oral Daily   levothyroxine  50 mcg Oral QAC breakfast   multivitamin with minerals  1 tablet Oral Daily   predniSONE  15 mg Oral Daily   tamoxifen  20 mg Oral Daily   ursodiol  600 mg Oral BID   Continuous Infusions:  ampicillin-sulbactam (UNASYN) IV 3 g (04/06/21 1244)   lactated ringers 75 mL/hr at 04/06/21 1210   PRN Meds: ondansetron  Labs Reviewed: BUN 29, creatinine 1.18  NUTRITION - FOCUSED PHYSICAL EXAM: Defer to in-person assessment  Diet Order:   Diet Order             DIET SOFT Room service appropriate? Yes; Fluid consistency: Thin  Diet  effective now                   EDUCATION NEEDS:  No education needs have been identified at this time  Skin:  Skin Assessment: Reviewed RN Assessment  Last BM:  7/17 - type 6  Height:  Ht Readings from Last 1 Encounters:  04/04/21 5\' 2"  (1.575 m)    Weight:  Wt Readings from Last 1 Encounters:  04/06/21 76.8 kg    Ideal Body Weight:  50 kg  BMI:  Body mass index is 30.97 kg/m.  Estimated Nutritional Needs:  Kcal:  1500-1700 kcal/d Protein:  75-85 g/d Fluid:  >157mL/d  Ranell Patrick, RD, LDN Clinical Dietitian Pager on [place]

## 2021-04-06 NOTE — Evaluation (Signed)
Physical Therapy Evaluation Patient Details Name: Adriana Martin MRN: [id number] DOB: 1946-05-09 Today's Date: 04/06/2021   History of Present Illness  Pt is a 75 y/o F admitted on 04/04/21 with c/c of rectal bleeding. Pt is being treated for acute colitis. PMH: HTN, HLD, COPD, hypothyroidism, depression, anxiety, pacemaker placement, cirrhosis, breast CA (s/p R mastectomy & radiation therapy), dementia, dCHF, a-fib on eliquis, CKD stage 3b  Clinical Impression  Pt seen for PT evaluation with pt agreeable to tx but reporting urgent need to use restroom. Pt transfers to [facility] with min assist & has continent void & BM. After toileting, pt ambulates into hallway with RW & close supervision<>CGA with decreased gait speed & heavy cuing for following path in hallway. Pt mobilizes well but is limited 2/2 impaired cognition (pt is only oriented to self & city). Pt is unsafe to d/c home alone & will require 24 assist upon d/c - pt reports she has plenty of family to provide this but PT not able to confirm at this time since no family in room. Will continue to follow pt acutely to address deficits noted below.     Follow Up Recommendations Home health PT;Supervision/Assistance - 24 hour    Equipment Recommendations  Rolling walker with 5" wheels    Recommendations for Other Services       Precautions / Restrictions Precautions Precautions: Fall Restrictions Weight Bearing Restrictions: No      Mobility  Bed Mobility Overal bed mobility: Needs Assistance Bed Mobility: Supine to Sit     Supine to sit: Supervision;HOB elevated     General bed mobility comments: use of bed rails    Transfers Overall transfer level: Needs assistance   Transfers: Sit to/from Stand;Stand Pivot Transfers Sit to Stand: Min assist Stand pivot transfers: Min assist       General transfer comment: cuing for safe hand placement, requires UE support on bed & BSC armrests during stand pivot  transfer  Ambulation/Gait Ambulation/Gait assistance: Min guard;Supervision Gait Distance (Feet): 153 Feet Assistive device: Rolling walker (2 wheeled) Gait Pattern/deviations: Decreased stride length     General Gait Details: Pt requires heavy directional cuing throughout as pt unsure how to follow hallway path  Stairs            Wheelchair Mobility    Modified Rankin (Stroke Patients Only)       Balance Overall balance assessment: Needs assistance Sitting-balance support: Feet supported;Bilateral upper extremity supported Sitting balance-Leahy Scale: Good       Standing balance-Leahy Scale: Poor Standing balance comment: requires BUE support                             Pertinent Vitals/Pain Pain Assessment: No/denies pain    Home Living Family/patient expects to be discharged to:: Private residence Living Arrangements: Alone Available Help at Discharge: Family Type of Home: Mobile home Home Access: Stairs to enter Entrance Stairs-Rails: Right;Left;Can reach both Entrance Stairs-Number of Steps: pt unable to report actual number, somewhere between 5-10 Home Layout: One level Home Equipment: Cane - quad      Prior Function Level of Independence: Independent with assistive device(s);Needs assistance         Comments: Pt reports she is ambulatory with QC at home, lives alone, takes care of her dog, her daughter lives next door & assists with meals somewhat.     Hand Dominance        Extremity/Trunk Assessment  Upper Extremity Assessment Upper Extremity Assessment: Generalized weakness    Lower Extremity Assessment Lower Extremity Assessment: Generalized weakness       Communication   Communication: No difficulties  Cognition Arousal/Alertness: Awake/alert Behavior During Therapy: WFL for tasks assessed/performed Overall Cognitive Status: History of cognitive impairments - at baseline Area of Impairment:  Orientation;Attention;Memory;Following commands;Awareness;Safety/judgement;Problem solving                 Orientation Level: Disoriented to;Place;Time;Situation (oriented to city only but not hospital)   Memory: Decreased short-term memory Following Commands: Follows one step commands consistently Safety/Judgement: Decreased awareness of deficits;Decreased awareness of safety Awareness: Anticipatory   General Comments: hx of dementia, only oriented to self & city      General Comments General comments (skin integrity, edema, etc.): HR 81 bpm at rest, up to 103 bpm with activity, continent void & BM on BSC, performs peri hygiene but requires f/u from PT to ensure cleanliness    Exercises     Assessment/Plan    PT Assessment Patient needs continued PT services  PT Problem List Decreased strength;Decreased mobility;Decreased safety awareness;Decreased activity tolerance;Decreased cognition;Decreased balance;Decreased knowledge of use of DME       PT Treatment Interventions DME instruction;Therapeutic activities;Cognitive remediation;Modalities;Therapeutic exercise;Gait training;Patient/family education;Balance training;Stair training;Functional mobility training;Neuromuscular re-education    PT Goals (Current goals can be found in the Care Plan section)  Acute Rehab PT Goals Patient Stated Goal: go home PT Goal Formulation: With patient Time For Goal Achievement: 04/20/21 Potential to Achieve Goals: Good    Frequency Min 2X/week   Barriers to discharge Decreased caregiver support      Co-evaluation               AM-PAC PT "6 Clicks" Mobility  Outcome Measure Help needed turning from your back to your side while in a flat bed without using bedrails?: None Help needed moving from lying on your back to sitting on the side of a flat bed without using bedrails?: A Little Help needed moving to and from a bed to a chair (including a wheelchair)?: A Little Help  needed standing up from a chair using your arms (e.g., wheelchair or bedside chair)?: A Little Help needed to walk in hospital room?: A Little Help needed climbing 3-5 steps with a railing? : A Little 6 Click Score: 19    End of Session Equipment Utilized During Treatment: Gait belt Activity Tolerance: Patient tolerated treatment well Patient left: in chair;with call bell/phone within reach;with chair alarm set Nurse Communication: Mobility status PT Visit Diagnosis: Difficulty in walking, not elsewhere classified (R26.2);Muscle weakness (generalized) (M62.81);Unsteadiness on feet (R26.81)    Time: 4854-6270 PT Time Calculation (min) (ACUTE ONLY): 22 min   Charges:   PT Evaluation $PT Eval Low Complexity: 1 Low PT Treatments $Therapeutic Activity: 8-22 mins        Adriana Martin, PT, DPT 04/06/21, 12:23 PM   Waunita Schooner 04/06/2021, 12:21 PM

## 2021-04-06 NOTE — TOC Initial Note (Addendum)
Transition of Care [facility]) - Initial/Assessment Note    Patient Details  Name: Adriana Martin MRN: [id number] Date of Birth: 03-10-1946  [facility] CM/SW Contact:    Magnus Ivan, LCSW Phone Number: 04/06/2021, 10:10 AM  Clinical Narrative:         Met with patient at bedside for high risk screening. Patient lives alone but daughter lives next door. Family provides transportation. PCP is Dr Raechel Ache (per [facility], patient could not recall). Pharmacy is [facility] in [place]. Patient has a RW, cane, and 3in1 at home. No HH or SNF history.         3:50- Spoke to patient about HHPT rec, patient unsure. Attempted call to daughter [name] to discuss, unable to reach. TOC to try again tomorrow.  Expected Discharge Plan: Home/Self Care Barriers to Discharge: Continued Medical Work up   Patient Goals and CMS Choice Patient states their goals for this hospitalization and ongoing recovery are:: to return home CMS Medicare.gov Compare Post Acute Care list provided to:: Patient Choice offered to / list presented to : Patient  Expected Discharge Plan and Services Expected Discharge Plan: Home/Self Care                                              Prior Living Arrangements/Services   Lives with:: Self Patient language and need for interpreter reviewed:: Yes Do you feel safe going back to the place where you live?: Yes      Need for Family Participation in Patient Care: Yes (Comment) Care giver support system in place?: Yes (comment) Current home services: DME Criminal Activity/Legal Involvement Pertinent to Current Situation/Hospitalization: No - Comment as needed  Activities of Daily Living Home Assistive Devices/Equipment: Cane (specify quad or straight) ADL Screening (condition at time of admission) Patient's cognitive ability adequate to safely complete daily activities?: No Is the patient deaf or have difficulty hearing?: No Does the patient have difficulty  seeing, even when wearing glasses/contacts?: No Does the patient have difficulty concentrating, remembering, or making decisions?: Yes Patient able to express need for assistance with ADLs?: Yes Does the patient have difficulty dressing or bathing?: Yes Independently performs ADLs?: No Does the patient have difficulty walking or climbing stairs?: Yes Weakness of Legs: None Weakness of Arms/Hands: None  Permission Sought/Granted Permission sought to share information with : Facility Sport and exercise psychologist, Family Supports Permission granted to share information with : Yes, Verbal Permission Granted     Permission granted to share info w AGENCY: HH/DME agencies if needed  Permission granted to share info w Relationship: daughter     Emotional Assessment       Orientation: : Oriented to Self, Oriented to Place, Oriented to  Time, Oriented to Situation Alcohol / Substance Use: Not Applicable Psych Involvement: No (comment)  Admission diagnosis:  Acute colitis [K52.9] Chronic dementia without behavioral disturbance (HCC) [F03.90] Atrial fibrillation, unspecified type ([place]) [I48.91] Colitis with rectal bleeding [K52.9, K62.5] Colitis [K52.9] Patient Active Problem List   Diagnosis Date Noted   Colitis 04/05/2021   Acute colitis 04/04/2021   Acute renal failure superimposed on stage 3b chronic kidney disease ([place]) 04/04/2021   HTN (hypertension) 04/04/2021   Atrial fibrillation, chronic (HCC) 04/04/2021   Chronic diastolic CHF (congestive heart failure) ([place]) 04/04/2021   Rectal bleeding 04/04/2021   COPD (chronic obstructive pulmonary disease) ([place]) 04/04/2021   HLD (hyperlipidemia)  04/04/2021   Hypothyroid 04/04/2021   Cirrhosis (HCC) 04/04/2021   Breast cancer (HCC) 04/04/2021   Dementia (HCC) 04/04/2021   PCP:  Thies, David, MD Pharmacy:  No Pharmacies Listed    Social Determinants of Health (SDOH) Interventions    Readmission Risk Interventions Readmission Risk  Prevention Plan 04/06/2021  Transportation Screening Complete  PCP or Specialist Appt within 3-5 Days Complete  HRI or Home Care Consult Complete  Social Work Consult for Recovery Care Planning/Counseling Complete  Palliative Care Screening Complete  Medication Review (RN Care Manager) Complete     

## 2021-04-06 NOTE — Progress Notes (Signed)
[facility] Progress Note  Patient: Adriana Martin    PPI:[id number]  DOA: 04/04/2021     Date of Service: the patient was seen and examined on 04/06/2021  Brief hospital course: PMH of HTN, HLD, COPD, hypothyroidism, depression, anxiety, PPM implant, cirrhosis, breast cancer SP mastectomy, dementia, HFpEF, chronic A. fib on Eliquis, CKD 3B.  Presents with complaints of rectal bleeding found to have colitis. Currently plan is continue IV antibiotics.  Monitor for diet tolerance  Subjective: No bowel movement.  Passing gas.  No nausea no vomiting.  Minimal oral intake.  Minimal secondary to poor dentition rather than pain.  Assessment and Plan: 1.  Acute colitis CT angiogram negative for any acute bleeding. Shows evidence of colitis. Initially started on IV Zosyn. We will switch to IV Unasyn. C. difficile and GI pathogen panel were initially ordered but canceled as patient did not have any bowel movement in 24 hours on arrival. Tolerating soft diet.  2.  Hematochezia Reportedly at home. Per daughter no BM. H&H relatively stable.  Dropped from 12.3-10.6 is dilutional. Monitor.  3.  AKI on CKD 3B Baseline serum creatinine around 1.1-1.3. On admission 1.71. Currently receiving IV hydration. Hold Lasix and lisinopril.  4.  HTN Blood pressure stable. Holding blood pressure medication.  5.  Chronic A. fib HR stable. Currently holding Cardizem. Also holding Eliquis for now.  If hemoglobin remains stable tomorrow will resume  6.  HFpEF Appears to be euvolemic. Currently actually receiving IV hydration. Monitor. Holding Lasix.  7. COPD: stable -Bronchodilators   8. HLD (hyperlipidemia) -Pravastatin, Zetia   9. Hypothyroid -Synthroid   10. Cirrhosis ([place]): AST 49, ALT 60, total improving 0.7, ALP 62. Normal INR and PTT -Normal ammonia level   11. Breast cancer ([place]) -Continue tamoxifen   12. Dementia: No behavioral disturbance -Donepezil   13.  Prednisone use: pt is on 5 mg prednisone daily, but not sure why patient is taking this medication.  Her daughter also does not know why patient is taking this medication. -Will increase prednisone from 5 to 15 mg daily due to stress  Scheduled Meds:  donepezil  5 mg Oral QHS   ezetimibe  10 mg Oral Daily   levothyroxine  50 mcg Oral QAC breakfast   mometasone-formoterol  2 puff Inhalation BID   multivitamin with minerals  1 tablet Oral Daily   predniSONE  15 mg Oral Daily   tamoxifen  20 mg Oral Daily   ursodiol  600 mg Oral BID   venlafaxine XR  37.5 mg Oral Daily   Continuous Infusions:  sodium chloride Stopped (04/06/21 0712)   ampicillin-sulbactam (UNASYN) IV 3 g (04/06/21 1739)   lactated ringers 75 mL/hr at 04/06/21 1210   PRN Meds: sodium chloride, acetaminophen, ondansetron (ZOFRAN) IV  Body mass index is 30.97 kg/m.  Nutrition Problem: Altered GI function Etiology: diarrhea (and rectal bleeding)     DVT Prophylaxis:   SCDs Start: 04/04/21 1329    Advance goals of care discussion: Pt is Full code.  Family Communication: no family was present at bedside, at the time of interview.   Data Reviewed: I have personally reviewed and interpreted daily labs, tele strips, imaging. Leukocytosis improving.  Hemoglobin dropped from 12.3-12.6.  Serum creatinine improving from 1.3-1.1 and BUN increasing appropriately to 39.  TSH and free T4 normal.  Physical Exam:  General: Appear in mild distress, no Rash; Oral Mucosa Clear, moist. no Abnormal Neck Mass Or lumps, Conjunctiva normal  Cardiovascular: S1 and S2  Present, no Murmur, Respiratory: good respiratory effort, Bilateral Air entry present and CTA, no Crackles, no wheezes Abdomen: Bowel Sound present, Soft and no tenderness Extremities: no Pedal edema Neurology: alert and oriented to time, place, and person affect appropriate. no new focal deficit Gait not checked due to patient safety concerns  Vitals:   04/06/21  0500 04/06/21 0513 04/06/21 0808 04/06/21 1528  BP:  (!) 135/59 (!) 126/55 (!) 148/72  Pulse:  60 60 60  Resp:  16 18 18   Temp:  98.8 F (37.1 C) 98.2 F (36.8 C) 98.9 F (37.2 C)  TempSrc:  Oral    SpO2:  95% 94% 97%  Weight: 76.8 kg     Height:        Disposition:  Status is: Inpatient  Remains inpatient appropriate because:IV treatments appropriate due to intensity of illness or inability to take PO  Dispo: The patient is from: Home              Anticipated d/c is to: Home              Patient currently is not medically stable to d/c.   Difficult to place patient No  Time spent: 35 minutes. I reviewed all nursing notes, pharmacy notes, vitals, pertinent old records. I have discussed plan of care as described above with RN.  Author: Berle Mull, MD [facility] 04/06/2021 6:18 PM  To reach On-call, see care teams to locate the attending and reach out via [email or web address]. Between 7PM-7AM, please contact night-coverage If you still have difficulty reaching the attending provider, please page the [facility] (Director on Call) for [facility] on amion for assistance.

## 2021-04-07 LAB — CBC
HCT: 33 % — ABNORMAL LOW (ref 36.0–46.0)
Hemoglobin: 10.9 g/dL — ABNORMAL LOW (ref 12.0–15.0)
MCH: 30.6 pg (ref 26.0–34.0)
MCHC: 33 g/dL (ref 30.0–36.0)
MCV: 92.7 fL (ref 80.0–100.0)
Platelets: 194 10*3/uL (ref 150–400)
RBC: 3.56 MIL/uL — ABNORMAL LOW (ref 3.87–5.11)
RDW: 14.4 % (ref 11.5–15.5)
WBC: 9.9 10*3/uL (ref 4.0–10.5)
nRBC: 0 % (ref 0.0–0.2)

## 2021-04-07 LAB — RETIC PANEL
Immature Retic Fract: 14.2 % (ref 2.3–15.9)
RBC.: 3.56 MIL/uL — ABNORMAL LOW (ref 3.87–5.11)
Retic Count, Absolute: 70.1 10*3/uL (ref 19.0–186.0)
Retic Ct Pct: 2 % (ref 0.4–3.1)
Reticulocyte Hemoglobin: 35.9 pg (ref >27.9)

## 2021-04-07 LAB — BASIC METABOLIC PANEL
Anion gap: 10 (ref 5–15)
BUN: 16 mg/dL (ref 8–23)
CO2: 23 mmol/L (ref 22–32)
Calcium: 8.2 mg/dL — ABNORMAL LOW (ref 8.9–10.3)
Chloride: 105 mmol/L (ref 98–111)
Creatinine, Ser: 1.01 mg/dL — ABNORMAL HIGH (ref 0.44–1.00)
GFR, Estimated: 58 mL/min — ABNORMAL LOW (ref >60)
Glucose, Bld: 84 mg/dL (ref 70–99)
Potassium: 3.4 mmol/L — ABNORMAL LOW (ref 3.5–5.1)
Sodium: 138 mmol/L (ref 135–145)

## 2021-04-07 MED ORDER — POTASSIUM CHLORIDE CRYS ER 20 MEQ PO TBCR
40.0000 meq | EXTENDED_RELEASE_TABLET | Freq: Once | ORAL | Status: AC
  Administered 2021-04-07: 40 meq via ORAL
  Filled 2021-04-07: qty 2

## 2021-04-07 MED ORDER — APIXABAN 5 MG PO TABS
5.0000 mg | ORAL_TABLET | Freq: Two times a day (BID) | ORAL | Status: DC
  Administered 2021-04-07: 5 mg via ORAL
  Filled 2021-04-07: qty 1

## 2021-04-07 MED ORDER — AMOXICILLIN-POT CLAVULANATE 875-125 MG PO TABS
1.0000 | ORAL_TABLET | Freq: Two times a day (BID) | ORAL | Status: DC
  Administered 2021-04-07: 1 via ORAL
  Filled 2021-04-07: qty 1

## 2021-04-07 MED ORDER — AMOXICILLIN-POT CLAVULANATE 875-125 MG PO TABS: 1.0000 | ORAL_TABLET | Freq: Two times a day (BID) | ORAL | 0 refills | Status: AC

## 2021-04-07 NOTE — Plan of Care (Signed)
  Problem: Education: Goal: Knowledge of General Education information will improve Description: Including pain rating scale, medication(s)/side effects and non-pharmacologic comfort measures 04/07/2021 1508 by Felipa Emory, RN Outcome: Adequate for Discharge 04/07/2021 1508 by Felipa Emory, RN Outcome: Not Met (add Reason)   Problem: Health Behavior/Discharge Planning: Goal: Ability to manage health-related needs will improve 04/07/2021 1508 by Felipa Emory, RN Outcome: Adequate for Discharge 04/07/2021 1508 by Felipa Emory, RN Outcome: Not Met (add Reason)   Problem: Clinical Measurements: Goal: Ability to maintain clinical measurements within normal limits will improve 04/07/2021 1508 by Felipa Emory, RN Outcome: Adequate for Discharge 04/07/2021 1508 by Felipa Emory, RN Outcome: Not Met (add Reason) Goal: Will remain free from infection 04/07/2021 1508 by Felipa Emory, RN Outcome: Adequate for Discharge 04/07/2021 1508 by Felipa Emory, RN Outcome: Not Met (add Reason) Goal: Diagnostic test results will improve 04/07/2021 1508 by Felipa Emory, RN Outcome: Adequate for Discharge 04/07/2021 1508 by Felipa Emory, RN Outcome: Not Met (add Reason) Goal: Respiratory complications will improve 04/07/2021 1508 by Felipa Emory, RN Outcome: Adequate for Discharge 04/07/2021 1508 by Felipa Emory, RN Outcome: Not Met (add Reason) Goal: Cardiovascular complication will be avoided 04/07/2021 1508 by Felipa Emory, RN Outcome: Adequate for Discharge 04/07/2021 1508 by Felipa Emory, RN Outcome: Not Met (add Reason)   Problem: Activity: Goal: Risk for activity intolerance will decrease 04/07/2021 1508 by Felipa Emory, RN Outcome: Adequate for Discharge 04/07/2021 1508 by Felipa Emory, RN Outcome: Not Met (add Reason)   Problem: Nutrition: Goal: Adequate nutrition will be maintained 04/07/2021 1508 by  Felipa Emory, RN Outcome: Adequate for Discharge 04/07/2021 1508 by Felipa Emory, RN Outcome: Not Met (add Reason)   Problem: Coping: Goal: Level of anxiety will decrease 04/07/2021 1508 by Felipa Emory, RN Outcome: Adequate for Discharge 04/07/2021 1508 by Felipa Emory, RN Outcome: Not Met (add Reason)   Problem: Elimination: Goal: Will not experience complications related to bowel motility 04/07/2021 1508 by Felipa Emory, RN Outcome: Adequate for Discharge 04/07/2021 1508 by Felipa Emory, RN Outcome: Not Met (add Reason) Goal: Will not experience complications related to urinary retention 04/07/2021 1508 by Felipa Emory, RN Outcome: Adequate for Discharge 04/07/2021 1508 by Felipa Emory, RN Outcome: Not Met (add Reason)   Problem: Pain Managment: Goal: General experience of comfort will improve 04/07/2021 1508 by Felipa Emory, RN Outcome: Adequate for Discharge 04/07/2021 1508 by Felipa Emory, RN Outcome: Not Met (add Reason)   Problem: Safety: Goal: Ability to remain free from injury will improve 04/07/2021 1508 by Felipa Emory, RN Outcome: Adequate for Discharge 04/07/2021 1508 by Felipa Emory, RN Outcome: Not Met (add Reason)   Problem: Skin Integrity: Goal: Risk for impaired skin integrity will decrease 04/07/2021 1508 by Felipa Emory, RN Outcome: Adequate for Discharge 04/07/2021 1508 by Felipa Emory, RN Outcome: Not Met (add Reason)

## 2021-04-07 NOTE — TOC Progression Note (Addendum)
Transition of Care [facility]) - Progression Note    Patient Details  Name: Adriana Martin MRN: [id number] Date of Birth: [birth year]  Transition of [facility]) CM/SW [place], LCSW Phone Number: 04/07/2021, 10:57 AM  Clinical Narrative:  Patient still unsure if she wants home health or not. She will continue to think about it. Will follow up at discharge.  1:51 pm: Per daughter [name], patient was set up with [facility] prior to admission and was supposed to start therapy on Friday. Left message for [facility] representative to confirm.  Expected Discharge Plan: Home/Self Care Barriers to Discharge: Continued Medical Work up  Expected Discharge Plan and Services Expected Discharge Plan: Home/Self Care                                               Social Determinants of Health (SDOH) Interventions    Readmission Risk Interventions Readmission Risk Prevention Plan 04/06/2021  Transportation Screening Complete  PCP or Specialist Appt within 3-5 Days Complete  HRI or [place] Complete  Social Work Consult for [place] Planning/Counseling Complete  Palliative Care Screening Complete  Medication Review Press photographer) Complete

## 2021-04-07 NOTE — Progress Notes (Signed)
Mobility Specialist - Progress Note   04/07/21 1400  Mobility  Activity Refused mobility  Mobility performed by Mobility specialist    Pt politely declined mobility this date, no reason specified. Pt just finished ambulation with PT, anticipating d/c later this date.    Kathee Delton Mobility Specialist 04/07/21, 2:37 PM

## 2021-04-07 NOTE — TOC Transition Note (Signed)
Transition of Care [facility]) - CM/SW Discharge Note   Patient Details  Name: Adriana Martin MRN: [id number] Date of Birth: 28-Jan-1946  [facility]) CM/SW Contact:  Candie Chroman, LCSW Phone Number: 04/07/2021, 2:26 PM   Clinical Narrative:   Patient has orders to discharge home today. [facility] confirmed she was set up with them prior to admission. Home health PT order is in. No further concerns. CSW signing off.  Final next level of care: [place] Barriers to Discharge: Barriers Resolved   Patient Goals and CMS Choice Patient states their goals for this hospitalization and ongoing recovery are:: to return home CMS Medicare.gov Compare Post Acute Care list provided to:: Patient Choice offered to / list presented to : Adult Children  Discharge Placement                  Name of family member notified: Katelind Pytel Patient and family notified of of transfer: 04/07/21  Discharge Plan and Services                          HH Arranged: PT [facility] Agency: [facility] Date [place]: 04/07/21   Representative spoke with at [place]: [place] ([place]) Interventions     Readmission Risk Interventions Readmission Risk Prevention Plan 04/06/2021  Transportation Screening Complete  PCP or Specialist Appt within 3-5 Days Complete  HRI or [place] Complete  Social Work Consult for [place] Planning/Counseling Complete  Palliative Care Screening Complete  Medication Review Press photographer) Complete

## 2021-04-07 NOTE — Progress Notes (Signed)
Physical Therapy Treatment Patient Details Name: Adriana Martin MRN: [id number] DOB: [birth year] Today's Date: 04/07/2021    History of Present Illness Pt is a 75 y/o F admitted on 04/04/21 with c/c of rectal bleeding. Pt is being treated for acute colitis. PMH: HTN, HLD, COPD, hypothyroidism, depression, anxiety, pacemaker placement, cirrhosis, breast CA (s/p R mastectomy & radiation therapy), dementia, dCHF, a-fib on eliquis, CKD stage 3b    PT Comments    Pt received upright in bed willing to participate with PT. Pt required supervision to sit EOB with HOB elevated and use of bed rails. Supervision to STS to RW. Amb 170' with RW at good cadence. Able to tolerate balance tasks of head turns, changes in gait speed, and stopping gait and resuming gait with no LOB. Required single standing rest break due to fatigue. HR in 80's and SpO2 >90% throughout. Intermittent cuing required for RW sequencing and prevent bumping into items in room/hallway. Pt returned to bed. Pt educated on benefits of HH PT to improve balance and endurance with functional mobility. Current D/c recs remain appropriate.    Follow Up Recommendations  Home health PT;Supervision/Assistance - 24 hour     Equipment Recommendations  Rolling walker with 5" wheels    Recommendations for Other Services       Precautions / Restrictions Precautions Precautions: Fall Restrictions Weight Bearing Restrictions: No    Mobility  Bed Mobility Overal bed mobility: Needs Assistance Bed Mobility: Supine to Sit     Supine to sit: Supervision;HOB elevated     General bed mobility comments: use of bed rails    Transfers Overall transfer level: Needs assistance Equipment used: Rolling walker (2 wheeled) Transfers: Sit to/from Stand Sit to Stand: Supervision         General transfer comment: cuing for safe hand placement.  Ambulation/Gait   Gait Distance (Feet): 170 Feet Assistive device: Rolling walker (2  wheeled) Gait Pattern/deviations: Step-through pattern     General Gait Details: Continues to require cuing for directions to navigate hallway.   Stairs             Wheelchair Mobility    Modified Rankin (Stroke Patients Only)       Balance Overall balance assessment: Needs assistance Sitting-balance support: Feet supported;Bilateral upper extremity supported Sitting balance-Leahy Scale: Good       Standing balance-Leahy Scale: Poor Standing balance comment: requires BUE support             High level balance activites: Head turns;Sudden stops High Level Balance Comments: Difficulty turning head R/L adequately. Able to stop and go with no LOB, able to speed up and slow down.            Cognition Arousal/Alertness: Awake/alert Behavior During Therapy: WFL for tasks assessed/performed Overall Cognitive Status: History of cognitive impairments - at baseline Area of Impairment: Orientation;Attention;Memory;Following commands;Awareness;Safety/judgement;Problem solving                 Orientation Level: Disoriented to;Place;Time;Situation   Memory: Decreased short-term memory Following Commands: Follows one step commands consistently Safety/Judgement: Decreased awareness of deficits;Decreased awareness of safety Awareness: Anticipatory          Exercises      General Comments        Pertinent Vitals/Pain Pain Assessment: No/denies pain    Home Living                      Prior Function  PT Goals (current goals can now be found in the care plan section) Acute Rehab PT Goals Patient Stated Goal: go home PT Goal Formulation: With patient Time For Goal Achievement: 04/20/21 Potential to Achieve Goals: Good Progress towards PT goals: Progressing toward goals    Frequency    Min 2X/week      PT Plan Current plan remains appropriate    Co-evaluation              AM-PAC PT "6 Clicks" Mobility   Outcome  Measure  Help needed turning from your back to your side while in a flat bed without using bedrails?: None Help needed moving from lying on your back to sitting on the side of a flat bed without using bedrails?: A Little Help needed moving to and from a bed to a chair (including a wheelchair)?: A Little Help needed standing up from a chair using your arms (e.g., wheelchair or bedside chair)?: A Little Help needed to walk in hospital room?: A Little Help needed climbing 3-5 steps with a railing? : A Little 6 Click Score: 19    End of Session Equipment Utilized During Treatment: Gait belt Activity Tolerance: Patient tolerated treatment well;Patient limited by fatigue Patient left: with call bell/phone within reach;with bed alarm set   PT Visit Diagnosis: Difficulty in walking, not elsewhere classified (R26.2);Muscle weakness (generalized) (M62.81);Unsteadiness on feet (R26.81)     Time: 7096-4383 PT Time Calculation (min) (ACUTE ONLY): 17 min  Charges:  $Gait Training: 8-22 mins                    Salem Caster. Fairly IV, PT, DPT Physical Therapist- [facility]  04/07/2021, 1:33 PM

## 2021-04-08 ENCOUNTER — Encounter: Payer: Self-pay | Admitting: Hematology and Oncology

## 2021-04-08 NOTE — Discharge Summary (Signed)
[facility] Discharge Summary   Patient: Adriana Martin JQB:[id number]  PCP: [name], MD  Date of admission: 04/04/2021   Date of discharge: 04/07/2021     Discharge Diagnoses:  Principal Problem:   Acute colitis Active Problems:   Acute renal failure superimposed on stage 3b chronic kidney disease (HCC)   HTN (hypertension)   Atrial fibrillation, chronic (HCC)   Chronic diastolic CHF (congestive heart failure) (HCC)   Rectal bleeding   COPD (chronic obstructive pulmonary disease) ([place])   HLD (hyperlipidemia)   Hypothyroid   Cirrhosis ([place])   Breast cancer ([place])   Dementia ([place])   Colitis   Admitted From: home Disposition:  Home   Recommendations for Outpatient Follow-up:  PCP: Follow-up with PCP in 1 week. Outpatient follow-up with GI after 6 weeks.   Follow-up Information     [facility].   Specialty: Home Health Services Why: They will follow up with you for your home health needs: Physical therapy Contact information: [address] [place] [address] [phone]                Discharge Instructions     Diet - low sodium heart healthy   Complete by: As directed    Increase activity slowly   Complete by: As directed        Diet recommendation: Cardiac diet  Activity: The patient is advised to gradually reintroduce usual activities, as tolerated  Discharge Condition: stable  Code Status: Full code   History of present illness: As per the H and P dictated on admission, "Adriana Martin is a 75 y.o. female with medical history significant of hypertension, HLD, COPD, hypothyroidism, depression, anxiety, pacemaker placement, cirrhosis, breast cancer (s/p of right mastectomy and radiation therapy), dementia, dCHF, atrial fibrillation on Eliquis, CKD stage IIIb, who presents with rectal bleeding.   Per her daughter (I called her daughter by phone), patient has had at least 2 episodes of rectal  bleeding which is mixed with diarrhea.  Patient has had several episodes of diarrhea.  Patient has nausea and 1 episode of nonbilious nonbloody vomiting.  Patient does not have abdominal pain.  No fever or chills.  Patient has mild shortness of breath due to COPD, no cough, chest pain.  Denies symptoms of UTI.  Per her daughter, patient had a fall on 7/4 with head injury, patient was seen in [facility] clinic and had negative CT of head.   ED Course: pt was found to have WBC 17.7, hemoglobin 14.0, INR 1.2, PTT 28, pending COVID-19 PCR, temperature normal, blood pressure 98/54, which improved to 114/60 after giving 1 L LR in ED.  Heart rate 59, RR 19.  CT angiogram of abdomen/pelvis showed possible colitis of splenic flexure . Patient is placed on MedSurg bed for patient"  Hospital Course:  Summary of her active problems in the hospital is as following. 1.  Acute colitis CT angiogram negative for any acute bleeding. Shows evidence of colitis. Initially started on IV Zosyn. We will switch to IV Unasyn.  Treat with Augmentin. Outpatient follow-up with GI. C. difficile and GI pathogen panel were initially ordered but canceled as patient did not have any bowel movement in 24 hours on arrival. Tolerating soft diet.   2.  Hematochezia Reportedly at home. Per daughter no BM. H&H relatively stable.  Dropped from 12.3-10.6 is dilutional. Outpatient follow-up with GI.   3.  AKI on CKD 3B Baseline serum creatinine around 1.1-1.3.  On admission 1.71. Treated with IV hydration.  R holding Lasix, lisinopril and potassium.   4.  HTN Blood pressure stable. Resuming home medication.  Holding Lasix lisinopril and Cardizem.   5.  Chronic A. fib HR stable. Resuming home regimen on discharge.  Holding Cardizem.   6.  HFpEF Appears to be euvolemic. Currently actually receiving IV hydration. Monitor. Holding Lasix.   7. COPD: stable -Bronchodilators   8. HLD (hyperlipidemia) -Pravastatin, Zetia   9.  Hypothyroid -Synthroid   10. Cirrhosis ([place]):  AST 49, ALT 60, total improving 0.7, ALP 62. Normal INR and PTT -Normal ammonia level   11. Breast cancer ([place]) -Continue tamoxifen   12. Dementia: No behavioral disturbance -Donepezil   13. Prednisone use: pt is on 5 mg prednisone daily, but not sure why patient is taking this medication.  Her daughter also does not know why patient is taking this medication. -Will increase prednisone from 5 to 15 mg daily due to stress  14.  Obesity. Placing the patient at high risk for poor outcome.  Body mass index is 31.17 kg/m.  Nutrition Problem: Altered GI function Etiology: diarrhea (and rectal bleeding) Nutrition Interventions: Interventions: Refer to RD note for recommendations  Patient was ambulatory without any assistance. On the day of the discharge the patient's vitals were stable, and no other new acute medical condition were reported. The patient was felt safe to be discharge at Home with Home health.  Consultants: none Procedures: none  DISCHARGE MEDICATION: Allergies as of 04/07/2021       Reactions   Atorvastatin Shortness Of Breath   Calcium Shortness Of Breath   Calcium Carbonate    Other reaction(s): Unknown   Calcium Carbonate    Fluoxetine    Other reaction(s): Unknown   Fluoxetine    Hydrochlorothiazide    Other reaction(s): Unknown   Hydrochlorothiazide         Medication List     STOP taking these medications    diltiazem 180 MG 24 hr capsule Commonly known as: CARDIZEM CD   furosemide 20 MG tablet Commonly known as: LASIX   lisinopril 5 MG tablet Commonly known as: ZESTRIL   potassium chloride 10 MEQ tablet Commonly known as: KLOR-CON       TAKE these medications    acetaminophen 500 MG tablet Commonly known as: TYLENOL Take 750 mg by mouth at bedtime as needed for mild pain. For jaw fracture   amoxicillin-clavulanate 875-125 MG tablet Commonly known as: AUGMENTIN Take 1 tablet by  mouth 2 (two) times daily for 4 days.   B-complex with vitamin C tablet Take 1 tablet by mouth daily.   donepezil 5 MG tablet Commonly known as: ARICEPT Take 5 mg by mouth at bedtime.   Eliquis 5 MG Tabs tablet Generic drug: apixaban Take 5 mg by mouth 2 (two) times daily.   ezetimibe 10 MG tablet Commonly known as: ZETIA Take 10 mg by mouth daily.   fluticasone-salmeterol 100-50 MCG/ACT Aepb Commonly known as: ADVAIR Inhale 1 puff into the lungs 2 (two) times daily.   levothyroxine 50 MCG tablet Commonly known as: SYNTHROID Take 50 mcg by mouth daily before breakfast.   multivitamin with minerals Tabs tablet Take 1 tablet by mouth daily.   pravastatin 40 MG tablet Commonly known as: PRAVACHOL Take 40 mg by mouth at bedtime.   predniSONE 5 MG tablet Commonly known as: DELTASONE Take 5 mg by mouth daily.   tamoxifen 20 MG tablet Commonly known as: NOLVADEX Take  20 mg by mouth daily.   ursodiol 300 MG capsule Commonly known as: ACTIGALL Take 600 mg by mouth 2 (two) times daily.   venlafaxine XR 37.5 MG 24 hr capsule Commonly known as: EFFEXOR-XR Take 37.5 mg by mouth daily.   Ventolin HFA 108 (90 Base) MCG/ACT inhaler Generic drug: albuterol Inhale 2 puffs into the lungs every 4 (four) hours as needed.   Vitamin D (Ergocalciferol) 1.25 MG (50000 UNIT) Caps capsule Commonly known as: DRISDOL Take 50,000 Units by mouth every 14 (fourteen) days. On Sundays        Discharge Exam: Filed Weights   04/05/21 0423 04/06/21 0500 04/07/21 0500  Weight: 74.4 kg 76.8 kg 77.3 kg   Vitals:   04/07/21 0638 04/07/21 0751  BP: (!) 170/75 (!) 148/68  Pulse: (!) 59 62  Resp: 18 18  Temp: (!) 97.5 F (36.4 C) 98.4 F (36.9 C)  SpO2: 95% 95%   General: Appear in mild distress, no Rash; Oral Mucosa Clear, moist. no Abnormal Neck Mass Or lumps, Conjunctiva normal  Cardiovascular: S1 and S2 Present, no Murmur, Respiratory: good respiratory effort, Bilateral Air entry  present and CTA, no Crackles, no wheezes Abdomen: Bowel Sound present, Soft and no tenderness Extremities: no Pedal edema Neurology: alert and oriented to time, place, and person affect appropriate. no new focal deficit Gait not checked due to patient safety concerns  The results of significant diagnostics from this hospitalization (including imaging, microbiology, ancillary and laboratory) are listed below for reference.    Significant Diagnostic Studies: ECHOCARDIOGRAM COMPLETE  Result Date: 04/05/2021    ECHOCARDIOGRAM REPORT   Patient Name:   Elsy JONES Nasworthy Date of Exam: 04/05/2021 Medical Rec #:  [id number]            Height:       62.0 in Accession #:    [id number]           Weight:       164.0 lb Date of Birth:  04/04/1946            BSA:          1.757 m Patient Age:    74 years             BP:           109/51 mmHg Patient Gender: F                    HR:           67  bpm. Exam Location:  [facility] Procedure: 2D Echo Indications:     Atrial Flutter I48.92  History:         Patient has no prior history of Echocardiogram examinations.  Sonographer:     Kathlen Brunswick RDCS Referring Phys:  [id number] [place] Diagnosing Phys: Skeet Latch MD IMPRESSIONS  1. Left ventricular ejection fraction, by estimation, is 60 to 65%. The left ventricle has normal function. The left ventricle has no regional wall motion abnormalities. Left ventricular diastolic parameters are indeterminate.  2. Right ventricular systolic function is normal. The right ventricular size is normal. There is normal pulmonary artery systolic pressure.  3. Left atrial size was severely dilated.  4. Right atrial size was severely dilated.  5. Compared with the echo 02/2020, mean bioprosthetic mitral valve gradient has increased from 10 mmHg to 16 mmHg. Visually the valve appears unchanged from 02/2020. Gradients may be elevated in the setting of high flow and sepsis. No increased thickness  or vegetations concerning for  endocarditis. The mitral valve has been repaired/replaced. No evidence of mitral valve regurgitation. Severe mitral stenosis. Echo findings are consistent with normal structure and function of the mitral valve prosthesis.  6. The aortic valve is tricuspid. Aortic valve regurgitation is trivial. No aortic stenosis is present.  7. The inferior vena cava is normal in size with greater than 50% respiratory variability, suggesting right atrial pressure of 3 mmHg. FINDINGS  Left Ventricle: Left ventricular ejection fraction, by estimation, is 60 to 65%. The left ventricle has normal function. The left ventricle has no regional wall motion abnormalities. The left ventricular internal cavity size was normal in size. There is  no left ventricular hypertrophy. Left ventricular diastolic parameters are indeterminate. Right Ventricle: The right ventricular size is normal. No increase in right ventricular wall thickness. Right ventricular systolic function is normal. There is normal pulmonary artery systolic pressure. The tricuspid regurgitant velocity is 2.35 m/s, and  with an assumed right atrial pressure of 3 mmHg, the estimated right ventricular systolic pressure is 99.3 mmHg. Left Atrium: Left atrial size was severely dilated. Right Atrium: Right atrial size was severely dilated. Pericardium: There is no evidence of pericardial effusion. Mitral Valve: Compared with the echo 02/2020, mean bioprosthetic mitral valve gradient has increased from 10 mmHg to 16 mmHg. Visually the valve appears unchanged from 02/2020. Gradients may be elevated in the setting of high flow and sepsis. No increased thickness or vegetations concerning for endocarditis. The mitral valve has been repaired/replaced. No evidence of mitral valve regurgitation. Echo findings are consistent with normal structure and function of the mitral valve prosthesis. Severe mitral valve stenosis. MV peak gradient, 38.4 mmHg. The mean mitral valve gradient is 16.0 mmHg.  Tricuspid Valve: The tricuspid valve is normal in structure. Tricuspid valve regurgitation is trivial. No evidence of tricuspid stenosis. Aortic Valve: The aortic valve is tricuspid. Aortic valve regurgitation is trivial. No aortic stenosis is present. Aortic valve mean gradient measures 6.0 mmHg. Aortic valve peak gradient measures 13.7 mmHg. Aortic valve area, by VTI measures 1.44 cm. Pulmonic Valve: The pulmonic valve was normal in structure. Pulmonic valve regurgitation is not visualized. No evidence of pulmonic stenosis. Aorta: The aortic root is normal in size and structure. Venous: The inferior vena cava is normal in size with greater than 50% respiratory variability, suggesting right atrial pressure of 3 mmHg. IAS/Shunts: No atrial level shunt detected by color flow Doppler.  LEFT VENTRICLE PLAX 2D LVIDd:         4.30 cm  Diastology LVIDs:         3.00 cm  LV e' medial:    4.35 cm/s LV PW:         1.40 cm  LV E/e' medial:  49.4 LV IVS:        1.00 cm  LV e' lateral:   4.68 cm/s LVOT diam:     1.80 cm  LV E/e' lateral: 45.9 LV SV:         43 LV SV Index:   25 LVOT Area:     2.54 cm  LEFT ATRIUM           Index       RIGHT ATRIUM           Index LA diam:      4.80 cm 2.73 cm/m  RA Area:     23.50 cm LA Vol (A2C): 32.1 ml 18.27 ml/m RA Volume:   77.90 ml  44.33 ml/m LA  Vol (A4C): 71.0 ml 40.41 ml/m  AORTIC VALVE                    PULMONIC VALVE AV Area (Vmax):    1.22 cm     PV Vmax:       0.91 m/s AV Area (Vmean):   1.28 cm     PV Peak grad:  3.3 mmHg AV Area (VTI):     1.44 cm AV Vmax:           185.00 cm/s AV Vmean:          114.000 cm/s AV VTI:            0.300 m AV Peak Grad:      13.7 mmHg AV Mean Grad:      6.0 mmHg LVOT Vmax:         88.70 cm/s LVOT Vmean:        57.200 cm/s LVOT VTI:          0.170 m LVOT/AV VTI ratio: 0.57  AORTA Ao Root diam: 2.90 cm Ao Asc diam:  3.20 cm MITRAL VALVE                TRICUSPID VALVE MV Area (PHT): 0.96 cm     TV Peak grad:   25.8 mmHg MV Area VTI:   0.32  cm     TV Vmax:        2.54 m/s MV Peak grad:  38.4 mmHg    TR Peak grad:   22.1 mmHg MV Mean grad:  16.0 mmHg    TR Vmax:        235.00 cm/s MV Vmax:       3.10 m/s MV Vmean:      184.0 cm/s   SHUNTS MV Decel Time: 789 msec     Systemic VTI:  0.17 m MV E velocity: 215.00 cm/s  Systemic Diam: 1.80 cm Skeet Latch MD Electronically signed by Skeet Latch MD Signature Date/Time: 04/05/2021/1:33:45 PM    Final    CT Angio Abd/Pel w/ and/or w/o  Result Date: 04/04/2021 CLINICAL DATA:  Rectal bleeding, Dementia, recent fall EXAM: CTA ABDOMEN AND PELVIS WITHOUT AND WITH CONTRAST TECHNIQUE: Multidetector CT imaging of the abdomen and pelvis was performed using the standard protocol during bolus administration of intravenous contrast. Multiplanar reconstructed images and MIPs were obtained and reviewed to evaluate the vascular anatomy. CONTRAST:  73mL OMNIPAQUE IOHEXOL 350 MG/ML SOLN COMPARISON:  03/20/2013 FINDINGS: VASCULAR Aorta: Moderate calcified atheromatous plaque. No aneurysm, dissection, or stenosis. Celiac: Calcified ostial plaque without stenosis, patent distally within remarkable branch anatomy. SMA: Ostial plaque extending over length of approximately 1.6 cm resulting in moderate stenosis. Vessels patent distally with classic branch anatomy. Renals: Single left, with proximal branching. No stenosis. Duplicated right, superior dominant, both patent. IMA: Patent without evidence of aneurysm, dissection, vasculitis or significant stenosis. Inflow: Mild plaque in bilateral common iliac arteries without stenosis. No dissection or aneurysm. Proximal Outflow: Bilateral common femoral and visualized portions of the superficial and profunda femoral arteries are patent without evidence of aneurysm, dissection, vasculitis or significant stenosis. Veins: Patent hepatic veins, portal vein, SMV, splenic vein, bilateral renal veins. Scattered mural calcifications in right common femoral and common iliac veins  suggesting sequela of previous central venous catheter or DVT. IVC is nondistended, unremarkable. Review of the MIP images confirms the above findings. NON-VASCULAR Lower chest: Cardiomegaly with biatrial enlargement. Transvenous pacing leads partially visualized. Mitral valve replacement. No pleural or  pericardial effusion. Hepatobiliary: No focal liver abnormality is seen. No gallstones, gallbladder wall thickening, or biliary dilatation. Pancreas: Unremarkable. No pancreatic ductal dilatation or surrounding inflammatory changes. Spleen: Normal in size without focal abnormality. Adrenals/Urinary Tract: Adrenal glands unremarkable. Symmetric renal enhancement. No hydronephrosis or urolithiasis. Urinary bladder incompletely distended. Stomach/Bowel: Stomach and small bowel are nondistended. Appendix not identified. No pericecal inflammatory change. The colon is nondilated. Suspect mild circumferential wall thickening in the splenic flexure with mild adjacent inflammatory change. Scattered sigmoid diverticula without adjacent inflammatory change. No evidence of active extravasation. Lymphatic: No abdominal or pelvic adenopathy. Reproductive: Status post hysterectomy. No adnexal masses. Other: Bilateral pelvic phleboliths.  No ascites.  No free air. Musculoskeletal: Chronic T8 and L1 vertebral body compression deformities stable since 07/27/2019. Stable grade 1 anterolisthesis L4-5 without pars defect. No acute fracture or aggressive bone lesion. IMPRESSION: 1. No evidence of active GI bleeding. 2. Wall thickening in the splenic flexure of the colon suggesting colitis, nonspecific. Consider GI consultation. 3. Sigmoid diverticulosis Electronically Signed   By: Lucrezia Europe M.D.   On: 04/04/2021 12:02    Microbiology: Recent Results (from the past 240 hour(s))  Culture, blood (x 2)     Status: None (Preliminary result)   Collection Time: 04/04/21  1:48 PM   Specimen: BLOOD  Result Value Ref Range Status    Specimen Description BLOOD BLOOD RIGHT HAND  Final   Special Requests   Final    BOTTLES DRAWN AEROBIC ONLY Blood Culture results may not be optimal due to an inadequate volume of blood received in culture bottles   Culture   Final    NO GROWTH 4 DAYS Performed at [facility], [address], [place], [place] [address]    Report Status PENDING  Incomplete  Culture, blood (x 2)     Status: None (Preliminary result)   Collection Time: 04/04/21  1:48 PM   Specimen: BLOOD  Result Value Ref Range Status   Specimen Description BLOOD LEFT ANTECUBITAL  Final   Special Requests   Final    BOTTLES DRAWN AEROBIC AND ANAEROBIC Blood Culture results may not be optimal due to an inadequate volume of blood received in culture bottles   Culture   Final    NO GROWTH 4 DAYS Performed at [facility], [address], [place], [place] [address]    Report Status PENDING  Incomplete  Resp Panel by RT-PCR (Flu A&B, Covid) Nasopharyngeal Swab     Status: None   Collection Time: 04/04/21  2:41 PM   Specimen: Nasopharyngeal Swab; Nasopharyngeal(NP) swabs in vial transport medium  Result Value Ref Range Status   SARS Coronavirus 2 by RT PCR NEGATIVE NEGATIVE Final    Comment: (NOTE) SARS-CoV-2 target nucleic acids are NOT DETECTED.  The SARS-CoV-2 RNA is generally detectable in upper respiratory specimens during the acute phase of infection. The lowest concentration of SARS-CoV-2 viral copies this assay can detect is 138 copies/mL. A negative result does not preclude SARS-Cov-2 infection and should not be used as the sole basis for treatment or other patient management decisions. A negative result may occur with  improper specimen collection/handling, submission of specimen other than nasopharyngeal swab, presence of viral mutation(s) within the areas targeted by this assay, and inadequate number of viral copies(<138 copies/mL). A negative result must be combined with clinical  observations, patient history, and epidemiological information. The expected result is Negative.  Fact Sheet for Patients:  EntrepreneurPulse.com.au  Fact Sheet for Healthcare Providers:  IncredibleEmployment.be  This test is no t yet approved or cleared by the [place] and  has been authorized for detection and/or diagnosis of SARS-CoV-2 by FDA under an Emergency Use Authorization (EUA). This EUA will remain  in effect (meaning this test can be used) for the duration of the COVID-19 declaration under Section 564(b)(1) of the Act, 21 U.S.C.section 360bbb-3(b)(1), unless the authorization is terminated  or revoked sooner.       Influenza A by PCR NEGATIVE NEGATIVE Final   Influenza B by PCR NEGATIVE NEGATIVE Final    Comment: (NOTE) The Xpert Xpress SARS-CoV-2/FLU/RSV plus assay is intended as an aid in the diagnosis of influenza from Nasopharyngeal swab specimens and should not be used as a sole basis for treatment. Nasal washings and aspirates are unacceptable for Xpert Xpress SARS-CoV-2/FLU/RSV testing.  Fact Sheet for Patients: EntrepreneurPulse.com.au  Fact Sheet for Healthcare Providers: IncredibleEmployment.be  This test is not yet approved or cleared by the [place] FDA and has been authorized for detection and/or diagnosis of SARS-CoV-2 by FDA under an Emergency Use Authorization (EUA). This EUA will remain in effect (meaning this test can be used) for the duration of the COVID-19 declaration under Section 564(b)(1) of the Act, 21 U.S.C. section 360bbb-3(b)(1), unless the authorization is terminated or revoked.  Performed at [facility], [address] [place], [place] [address]      Labs: CBC: Recent Labs  Lab 04/04/21 [phone] 04/04/21 1326 04/04/21 1955 04/05/21 0120 04/05/21 0652 04/06/21 0407 04/07/21 0829  WBC 17.7*   < > 16.8* 16.8* 14.0* 11.6* 9.9   NEUTROABS 16.0*  --   --   --   --   --   --   HGB 14.0   < > 12.5 12.5 12.3 10.6* 10.9*  HCT 42.7   < > 37.1 38.5 37.3 31.5* 33.0*  MCV 92.6   < > 90.9 93.2 97.1 92.9 92.7  PLT 262   < > 237 223 182 198 194   < > = values in this interval not displayed.   Basic Metabolic Panel: Recent Labs  Lab 04/04/21 0843 04/05/21 0353 04/06/21 0407 04/07/21 0829  NA 137 140 137 138  K 4.7 5.1 3.9 3.4*  CL 101 110 106 105  CO2 24 24 24 23   GLUCOSE 160* 117* 95 84  BUN 51* 43* 29* 16  CREATININE 1.71* 1.35* 1.18* 1.01*  CALCIUM 9.4 8.5* 8.2* 8.2*   Liver Function Tests: Recent Labs  Lab 04/04/21 0843  AST 49*  ALT 60*  ALKPHOS 62  BILITOT 0.7  PROT 6.8  ALBUMIN 3.4*   CBG: No results for input(s): GLUCAP in the last 168 hours.  Time spent: 35 minutes  Signed:  Berle Mull  [facility] 04/07/2021

## 2021-04-09 LAB — CULTURE, BLOOD (ROUTINE X 2)
Culture: NO GROWTH
Culture: NO GROWTH

## 2021-04-11 DIAGNOSIS — E559 Vitamin D deficiency, unspecified: Secondary | ICD-10-CM | POA: Diagnosis not present

## 2021-04-11 DIAGNOSIS — I480 Paroxysmal atrial fibrillation: Secondary | ICD-10-CM | POA: Diagnosis not present

## 2021-04-11 DIAGNOSIS — I5032 Chronic diastolic (congestive) heart failure: Secondary | ICD-10-CM | POA: Diagnosis not present

## 2021-04-11 DIAGNOSIS — F419 Anxiety disorder, unspecified: Secondary | ICD-10-CM | POA: Diagnosis not present

## 2021-04-11 DIAGNOSIS — G43909 Migraine, unspecified, not intractable, without status migrainosus: Secondary | ICD-10-CM | POA: Diagnosis not present

## 2021-04-11 DIAGNOSIS — F329 Major depressive disorder, single episode, unspecified: Secondary | ICD-10-CM | POA: Diagnosis not present

## 2021-04-11 DIAGNOSIS — I130 Hypertensive heart and chronic kidney disease with heart failure and stage 1 through stage 4 chronic kidney disease, or unspecified chronic kidney disease: Secondary | ICD-10-CM | POA: Diagnosis not present

## 2021-04-11 DIAGNOSIS — S02611D Fracture of condylar process of right mandible, subsequent encounter for fracture with routine healing: Secondary | ICD-10-CM | POA: Diagnosis not present

## 2021-04-11 DIAGNOSIS — E538 Deficiency of other specified B group vitamins: Secondary | ICD-10-CM | POA: Diagnosis not present

## 2021-04-11 DIAGNOSIS — N1832 Chronic kidney disease, stage 3b: Secondary | ICD-10-CM | POA: Diagnosis not present

## 2021-04-11 DIAGNOSIS — I4892 Unspecified atrial flutter: Secondary | ICD-10-CM | POA: Diagnosis not present

## 2021-04-11 DIAGNOSIS — E039 Hypothyroidism, unspecified: Secondary | ICD-10-CM | POA: Diagnosis not present

## 2021-04-11 DIAGNOSIS — K219 Gastro-esophageal reflux disease without esophagitis: Secondary | ICD-10-CM | POA: Diagnosis not present

## 2021-04-11 DIAGNOSIS — J449 Chronic obstructive pulmonary disease, unspecified: Secondary | ICD-10-CM | POA: Diagnosis not present

## 2021-04-11 DIAGNOSIS — D631 Anemia in chronic kidney disease: Secondary | ICD-10-CM | POA: Diagnosis not present

## 2021-04-11 DIAGNOSIS — F0280 Dementia in other diseases classified elsewhere without behavioral disturbance: Secondary | ICD-10-CM | POA: Diagnosis not present

## 2021-04-19 ENCOUNTER — Inpatient Hospital Stay
Admission: EM | Admit: 2021-04-19 | Discharge: 2021-04-28 | DRG: 308 | Disposition: A | Discharge status: Skilled Nursing Facility | Payer: Medicare Other | Attending: Internal Medicine | Admitting: Internal Medicine

## 2021-04-19 ENCOUNTER — Encounter: Payer: Self-pay | Admitting: Emergency Medicine

## 2021-04-19 ENCOUNTER — Other Ambulatory Visit: Payer: Self-pay

## 2021-04-19 ENCOUNTER — Emergency Department: Payer: Medicare Other

## 2021-04-19 DIAGNOSIS — I442 Atrioventricular block, complete: Secondary | ICD-10-CM | POA: Diagnosis present

## 2021-04-19 DIAGNOSIS — I5023 Acute on chronic systolic (congestive) heart failure: Secondary | ICD-10-CM | POA: Diagnosis not present

## 2021-04-19 DIAGNOSIS — N179 Acute kidney failure, unspecified: Secondary | ICD-10-CM | POA: Diagnosis not present

## 2021-04-19 DIAGNOSIS — E785 Hyperlipidemia, unspecified: Secondary | ICD-10-CM | POA: Diagnosis present

## 2021-04-19 DIAGNOSIS — I472 Ventricular tachycardia: Secondary | ICD-10-CM | POA: Diagnosis present

## 2021-04-19 DIAGNOSIS — Z888 Allergy status to other drugs, medicaments and biological substances status: Secondary | ICD-10-CM | POA: Diagnosis not present

## 2021-04-19 DIAGNOSIS — Z9011 Acquired absence of right breast and nipple: Secondary | ICD-10-CM

## 2021-04-19 DIAGNOSIS — N189 Chronic kidney disease, unspecified: Secondary | ICD-10-CM

## 2021-04-19 DIAGNOSIS — I248 Other forms of acute ischemic heart disease: Secondary | ICD-10-CM | POA: Diagnosis not present

## 2021-04-19 DIAGNOSIS — N1832 Chronic kidney disease, stage 3b: Secondary | ICD-10-CM | POA: Diagnosis present

## 2021-04-19 DIAGNOSIS — C50919 Malignant neoplasm of unspecified site of unspecified female breast: Secondary | ICD-10-CM | POA: Diagnosis present

## 2021-04-19 DIAGNOSIS — I10 Essential (primary) hypertension: Secondary | ICD-10-CM | POA: Diagnosis not present

## 2021-04-19 DIAGNOSIS — R1312 Dysphagia, oropharyngeal phase: Secondary | ICD-10-CM | POA: Diagnosis not present

## 2021-04-19 DIAGNOSIS — M6281 Muscle weakness (generalized): Secondary | ICD-10-CM | POA: Diagnosis not present

## 2021-04-19 DIAGNOSIS — Z7901 Long term (current) use of anticoagulants: Secondary | ICD-10-CM

## 2021-04-19 DIAGNOSIS — Z853 Personal history of malignant neoplasm of breast: Secondary | ICD-10-CM

## 2021-04-19 DIAGNOSIS — I5033 Acute on chronic diastolic (congestive) heart failure: Secondary | ICD-10-CM | POA: Diagnosis present

## 2021-04-19 DIAGNOSIS — Z20822 Contact with and (suspected) exposure to covid-19: Secondary | ICD-10-CM | POA: Diagnosis present

## 2021-04-19 DIAGNOSIS — Z8249 Family history of ischemic heart disease and other diseases of the circulatory system: Secondary | ICD-10-CM

## 2021-04-19 DIAGNOSIS — I4901 Ventricular fibrillation: Secondary | ICD-10-CM | POA: Diagnosis not present

## 2021-04-19 DIAGNOSIS — I469 Cardiac arrest, cause unspecified: Secondary | ICD-10-CM | POA: Diagnosis not present

## 2021-04-19 DIAGNOSIS — J9601 Acute respiratory failure with hypoxia: Secondary | ICD-10-CM

## 2021-04-19 DIAGNOSIS — I4891 Unspecified atrial fibrillation: Secondary | ICD-10-CM | POA: Diagnosis present

## 2021-04-19 DIAGNOSIS — K219 Gastro-esophageal reflux disease without esophagitis: Secondary | ICD-10-CM | POA: Diagnosis present

## 2021-04-19 DIAGNOSIS — I4821 Permanent atrial fibrillation: Secondary | ICD-10-CM | POA: Diagnosis not present

## 2021-04-19 DIAGNOSIS — E876 Hypokalemia: Secondary | ICD-10-CM | POA: Diagnosis not present

## 2021-04-19 DIAGNOSIS — R0602 Shortness of breath: Secondary | ICD-10-CM | POA: Diagnosis not present

## 2021-04-19 DIAGNOSIS — J9621 Acute and chronic respiratory failure with hypoxia: Secondary | ICD-10-CM | POA: Diagnosis present

## 2021-04-19 DIAGNOSIS — Z800 Family history of malignant neoplasm of digestive organs: Secondary | ICD-10-CM

## 2021-04-19 DIAGNOSIS — F0390 Unspecified dementia without behavioral disturbance: Secondary | ICD-10-CM | POA: Diagnosis present

## 2021-04-19 DIAGNOSIS — I480 Paroxysmal atrial fibrillation: Secondary | ICD-10-CM | POA: Diagnosis not present

## 2021-04-19 DIAGNOSIS — Z803 Family history of malignant neoplasm of breast: Secondary | ICD-10-CM

## 2021-04-19 DIAGNOSIS — Z807 Family history of other malignant neoplasms of lymphoid, hematopoietic and related tissues: Secondary | ICD-10-CM

## 2021-04-19 DIAGNOSIS — Z950 Presence of cardiac pacemaker: Secondary | ICD-10-CM

## 2021-04-19 DIAGNOSIS — J449 Chronic obstructive pulmonary disease, unspecified: Secondary | ICD-10-CM | POA: Diagnosis not present

## 2021-04-19 DIAGNOSIS — I509 Heart failure, unspecified: Secondary | ICD-10-CM | POA: Diagnosis not present

## 2021-04-19 DIAGNOSIS — R531 Weakness: Secondary | ICD-10-CM

## 2021-04-19 DIAGNOSIS — I517 Cardiomegaly: Secondary | ICD-10-CM | POA: Diagnosis not present

## 2021-04-19 DIAGNOSIS — Z79899 Other long term (current) drug therapy: Secondary | ICD-10-CM

## 2021-04-19 DIAGNOSIS — Z7952 Long term (current) use of systemic steroids: Secondary | ICD-10-CM | POA: Diagnosis not present

## 2021-04-19 DIAGNOSIS — Z923 Personal history of irradiation: Secondary | ICD-10-CM

## 2021-04-19 DIAGNOSIS — J90 Pleural effusion, not elsewhere classified: Secondary | ICD-10-CM | POA: Diagnosis not present

## 2021-04-19 DIAGNOSIS — I110 Hypertensive heart disease with heart failure: Secondary | ICD-10-CM | POA: Diagnosis not present

## 2021-04-19 DIAGNOSIS — I462 Cardiac arrest due to underlying cardiac condition: Secondary | ICD-10-CM | POA: Diagnosis present

## 2021-04-19 DIAGNOSIS — K743 Primary biliary cirrhosis: Secondary | ICD-10-CM

## 2021-04-19 DIAGNOSIS — I130 Hypertensive heart and chronic kidney disease with heart failure and stage 1 through stage 4 chronic kidney disease, or unspecified chronic kidney disease: Secondary | ICD-10-CM | POA: Diagnosis not present

## 2021-04-19 DIAGNOSIS — R778 Other specified abnormalities of plasma proteins: Secondary | ICD-10-CM | POA: Diagnosis not present

## 2021-04-19 DIAGNOSIS — E559 Vitamin D deficiency, unspecified: Secondary | ICD-10-CM | POA: Diagnosis present

## 2021-04-19 DIAGNOSIS — R0902 Hypoxemia: Secondary | ICD-10-CM | POA: Diagnosis not present

## 2021-04-19 DIAGNOSIS — Z8673 Personal history of transient ischemic attack (TIA), and cerebral infarction without residual deficits: Secondary | ICD-10-CM

## 2021-04-19 DIAGNOSIS — R2681 Unsteadiness on feet: Secondary | ICD-10-CM | POA: Diagnosis not present

## 2021-04-19 DIAGNOSIS — I482 Chronic atrial fibrillation, unspecified: Secondary | ICD-10-CM | POA: Diagnosis not present

## 2021-04-19 DIAGNOSIS — F419 Anxiety disorder, unspecified: Secondary | ICD-10-CM | POA: Diagnosis not present

## 2021-04-19 DIAGNOSIS — I4892 Unspecified atrial flutter: Secondary | ICD-10-CM | POA: Diagnosis present

## 2021-04-19 DIAGNOSIS — E039 Hypothyroidism, unspecified: Secondary | ICD-10-CM

## 2021-04-19 DIAGNOSIS — I5032 Chronic diastolic (congestive) heart failure: Secondary | ICD-10-CM | POA: Diagnosis not present

## 2021-04-19 DIAGNOSIS — Z96611 Presence of right artificial shoulder joint: Secondary | ICD-10-CM | POA: Diagnosis present

## 2021-04-19 DIAGNOSIS — Z79890 Hormone replacement therapy (postmenopausal): Secondary | ICD-10-CM

## 2021-04-19 DIAGNOSIS — K7460 Unspecified cirrhosis of liver: Secondary | ICD-10-CM | POA: Diagnosis present

## 2021-04-19 DIAGNOSIS — R1013 Epigastric pain: Secondary | ICD-10-CM | POA: Diagnosis not present

## 2021-04-19 LAB — BASIC METABOLIC PANEL
Anion gap: 11 (ref 5–15)
BUN: 23 mg/dL (ref 8–23)
CO2: 25 mmol/L (ref 22–32)
Calcium: 9.1 mg/dL (ref 8.9–10.3)
Chloride: 100 mmol/L (ref 98–111)
Creatinine, Ser: 1.22 mg/dL — ABNORMAL HIGH (ref 0.44–1.00)
GFR, Estimated: 46 mL/min — ABNORMAL LOW (ref >60)
Glucose, Bld: 82 mg/dL (ref 70–99)
Potassium: 3.6 mmol/L (ref 3.5–5.1)
Sodium: 136 mmol/L (ref 135–145)

## 2021-04-19 LAB — CBC
HCT: 39.6 % (ref 36.0–46.0)
Hemoglobin: 12.1 g/dL (ref 12.0–15.0)
MCH: 30 pg (ref 26.0–34.0)
MCHC: 30.6 g/dL (ref 30.0–36.0)
MCV: 98 fL (ref 80.0–100.0)
Platelets: 202 10*3/uL (ref 150–400)
RBC: 4.04 MIL/uL (ref 3.87–5.11)
RDW: 15 % (ref 11.5–15.5)
WBC: 9.6 10*3/uL (ref 4.0–10.5)
nRBC: 0 % (ref 0.0–0.2)

## 2021-04-19 LAB — RESP PANEL BY RT-PCR (FLU A&B, COVID) ARPGX2
Influenza A by PCR: NEGATIVE
Influenza B by PCR: NEGATIVE
SARS Coronavirus 2 by RT PCR: NEGATIVE

## 2021-04-19 LAB — BRAIN NATRIURETIC PEPTIDE: B Natriuretic Peptide: 558.3 pg/mL — ABNORMAL HIGH (ref 0.0–100.0)

## 2021-04-19 LAB — TROPONIN I (HIGH SENSITIVITY): Troponin I (High Sensitivity): 47 ng/L — ABNORMAL HIGH (ref <18)

## 2021-04-19 MED ORDER — EZETIMIBE 10 MG PO TABS
10.0000 mg | ORAL_TABLET | Freq: Every day | ORAL | Status: DC
  Administered 2021-04-20 – 2021-04-28 (×9): 10 mg via ORAL
  Filled 2021-04-19 (×9): qty 1

## 2021-04-19 MED ORDER — FUROSEMIDE 10 MG/ML IJ SOLN
40.0000 mg | Freq: Once | INTRAMUSCULAR | Status: AC
  Administered 2021-04-19: 40 mg via INTRAVENOUS
  Filled 2021-04-19: qty 4

## 2021-04-19 MED ORDER — VENLAFAXINE HCL ER 37.5 MG PO CP24
37.5000 mg | ORAL_CAPSULE | Freq: Every day | ORAL | Status: DC
  Administered 2021-04-20 – 2021-04-28 (×9): 37.5 mg via ORAL
  Filled 2021-04-19 (×9): qty 1

## 2021-04-19 MED ORDER — MOMETASONE FURO-FORMOTEROL FUM 100-5 MCG/ACT IN AERO
2.0000 | INHALATION_SPRAY | Freq: Two times a day (BID) | RESPIRATORY_TRACT | Status: DC
  Administered 2021-04-20 – 2021-04-28 (×15): 2 via RESPIRATORY_TRACT
  Filled 2021-04-19: qty 8.8

## 2021-04-19 MED ORDER — POLYETHYLENE GLYCOL 3350 17 G PO PACK
17.0000 g | PACK | Freq: Every day | ORAL | Status: DC | PRN
  Filled 2021-04-19: qty 1

## 2021-04-19 MED ORDER — DONEPEZIL HCL 5 MG PO TABS
5.0000 mg | ORAL_TABLET | Freq: Every day | ORAL | Status: DC
  Administered 2021-04-20 – 2021-04-27 (×8): 5 mg via ORAL
  Filled 2021-04-19 (×11): qty 1

## 2021-04-19 MED ORDER — ACETAMINOPHEN 325 MG PO TABS
650.0000 mg | ORAL_TABLET | Freq: Four times a day (QID) | ORAL | Status: DC | PRN
  Administered 2021-04-21 – 2021-04-26 (×9): 650 mg via ORAL
  Filled 2021-04-19 (×9): qty 2

## 2021-04-19 MED ORDER — URSODIOL 300 MG PO CAPS
600.0000 mg | ORAL_CAPSULE | Freq: Two times a day (BID) | ORAL | Status: DC
  Administered 2021-04-20 – 2021-04-28 (×17): 600 mg via ORAL
  Filled 2021-04-19 (×19): qty 2

## 2021-04-19 MED ORDER — PREDNISONE 10 MG PO TABS
5.0000 mg | ORAL_TABLET | Freq: Every day | ORAL | Status: DC
  Administered 2021-04-20 – 2021-04-28 (×9): 5 mg via ORAL
  Filled 2021-04-19 (×9): qty 1

## 2021-04-19 MED ORDER — ALBUTEROL SULFATE HFA 108 (90 BASE) MCG/ACT IN AERS: 1.0000 | INHALATION_SPRAY | RESPIRATORY_TRACT | Status: DC | PRN

## 2021-04-19 MED ORDER — SODIUM CHLORIDE 0.9% FLUSH
3.0000 mL | Freq: Two times a day (BID) | INTRAVENOUS | Status: DC
  Administered 2021-04-20 – 2021-04-28 (×17): 3 mL via INTRAVENOUS

## 2021-04-19 MED ORDER — TAMOXIFEN CITRATE 10 MG PO TABS
20.0000 mg | ORAL_TABLET | Freq: Every day | ORAL | Status: DC
  Administered 2021-04-20 – 2021-04-28 (×9): 20 mg via ORAL
  Filled 2021-04-19 (×9): qty 2

## 2021-04-19 MED ORDER — APIXABAN 5 MG PO TABS
5.0000 mg | ORAL_TABLET | Freq: Two times a day (BID) | ORAL | Status: DC
  Administered 2021-04-20 – 2021-04-28 (×17): 5 mg via ORAL
  Filled 2021-04-19 (×19): qty 1

## 2021-04-19 MED ORDER — PRAVASTATIN SODIUM 40 MG PO TABS
40.0000 mg | ORAL_TABLET | Freq: Every day | ORAL | Status: DC
  Administered 2021-04-20 – 2021-04-27 (×8): 40 mg via ORAL
  Filled 2021-04-19: qty 1
  Filled 2021-04-19: qty 2
  Filled 2021-04-19 (×6): qty 1

## 2021-04-19 MED ORDER — LEVOTHYROXINE SODIUM 50 MCG PO TABS
50.0000 ug | ORAL_TABLET | Freq: Every day | ORAL | Status: DC
  Administered 2021-04-20 – 2021-04-28 (×9): 50 ug via ORAL
  Filled 2021-04-19 (×9): qty 1

## 2021-04-19 MED ORDER — FUROSEMIDE 10 MG/ML IJ SOLN
40.0000 mg | Freq: Two times a day (BID) | INTRAMUSCULAR | Status: DC
  Administered 2021-04-20 – 2021-04-22 (×6): 40 mg via INTRAVENOUS
  Filled 2021-04-19 (×6): qty 4

## 2021-04-19 MED ORDER — ACETAMINOPHEN 650 MG RE SUPP: 650.0000 mg | Freq: Four times a day (QID) | RECTAL | Status: DC | PRN

## 2021-04-19 NOTE — ED Provider Notes (Addendum)
[facility] Emergency Department Provider Note  ____________________________________________   Event Date/Time   First MD Initiated Contact with Patient 04/19/21 2150     (approximate)  I have reviewed the triage vital signs and the nursing notes.   HISTORY  Chief Complaint Shortness of Breath   HPI Adriana Martin is a 75 y.o. female with medical history significant of hypertension, HLD, COPD, hypothyroidism, depression, anxiety, pacemaker placement, cirrhosis, breast cancer (s/p of right mastectomy and radiation therapy), dementia, dCHF, atrial fibrillation on Eliquis, CKD stage IIIb and recent admission for colitis having been d/c w/ instructions to hold her lasix who presents for assessment of some swelling in her legs per patient.  She is not sure if there is any other reason she is in emergency room.  She denies any chest pain, cough or shortness of breath but per daughter I was able to speak with over the phone patient has been getting more short of breath since leaving the hospital especially with exertion.  She has also noticed the patient has had some increased swelling in her legs.  Home health aide reported SPO2 of 87% last week.  Patient reported has not had any falls or injuries, vomiting, fevers, cough or any other clear sick symptoms per daughter.  She is normally little confused and not oriented secondary to her dementia.  No other acute concerns at this time         Past Medical History:  Diagnosis Date   Anemia    Anxiety    Atrial fibrillation ([place])    B12 deficiency    Breast cancer ([place])    RT Lumpectomy c radiation 2012   Breast cancer (HCC)    CHF (congestive heart failure) (HCC)    Chronic diastolic CHF (congestive heart failure) (HCC)    Chronic kidney disease    Cirrhosis (HCC)    CKD (chronic kidney disease), stage III (HCC)    COPD (chronic obstructive pulmonary disease) ([place])    Depression    Family history of breast  cancer    Family history of esophageal cancer    Family history of multiple myeloma    GERD (gastroesophageal reflux disease)    Headache    Heart murmur    HLD (hyperlipidemia)    HTN (hypertension)    Hyperlipemia    Hyperlipidemia    Hypertension    Hypothyroid    Hypothyroidism    Iron (Fe) deficiency anemia    Pacemaker    Personal history of radiation therapy    Presence of permanent cardiac pacemaker    Shortness of breath dyspnea    Thrombocytopenia (HCC)    Vitamin D deficiency     Patient Active Problem List   Diagnosis Date Noted   Colitis 04/05/2021   Acute colitis 04/04/2021   Acute renal failure superimposed on stage 3b chronic kidney disease ([place]) 04/04/2021   HTN (hypertension) 04/04/2021   Atrial fibrillation, chronic (HCC) 04/04/2021   Chronic diastolic CHF (congestive heart failure) ([place]) 04/04/2021   Rectal bleeding 04/04/2021   COPD (chronic obstructive pulmonary disease) ([place]) 04/04/2021   HLD (hyperlipidemia) 04/04/2021   Hypothyroid 04/04/2021   Cirrhosis ([place]) 04/04/2021   Breast cancer ([place]) 04/04/2021   Dementia ([place]) 04/04/2021   Essential hypertension 03/14/2020   Hyperlipidemia 03/14/2020   Acute ischemic stroke ([place]) L MCA s/p mechanical thrombectomy, embolic d/t AF not on [facility] 03/10/2020   Osteopenia 01/21/2020   Family history of breast cancer    Family history  of multiple myeloma    Family history of esophageal cancer    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/2018   MGUS (monoclonal gammopathy of unknown significance) 12/14/2017   Carcinoma of overlapping sites of right breast in female, estrogen receptor positive ([place]) 06/16/2016   Acute exacerbation of CHF (congestive heart failure) ([place]) 06/07/2016   A-fib ([place]) 04/08/2016   COPD exacerbation ([place]) 03/22/2016   Acute bronchitis 03/22/2016   Atrial fibrillation with RVR ([place]) 03/22/2016   Chronic renal insufficiency 03/22/2016   Cough with expectoration 06/04/2015   Essential  thrombocytosis ([place]) 02/01/2015   Anemia 02/01/2015   Atrial flutter ([place]) 01/23/2015   Atrial fibrillation ([place]) 01/23/2015   Other long term (current) drug therapy 11/06/2014   Chronic diastolic heart failure ([place]) 08/01/2014   Long term current use of anticoagulant 06/06/2014   Anxiety 05/13/2014   Chronic obstructive pulmonary disease ([place]) 03/22/2014   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   Avitaminosis D 02/19/2014    Past Surgical History:  Procedure Laterality Date   ABDOMINAL HYSTERECTOMY     Partial   ABLATION     APPENDECTOMY     BICEPT TENODESIS Right 01/07/2018   Procedure: BICEPS TENODESIS;  Surgeon: Leim Fabry, MD;  Location: [facility] ORS;  Service: Orthopedics;  Laterality: Right;   BREAST BIOPSY Right 2012   BREAST BIOPSY Right 07/21/2019   venus clip, [place] Bx,positive   BREAST LUMPECTOMY Right 2012   positive/rad   BREAST SURGERY     CARDIAC SURGERY     CARDIAC VALVE REPLACEMENT     COLONOSCOPY  2012   ELECTROPHYSIOLOGIC STUDY N/A 01/23/2015   Procedure: CARDIOVERSION;  Surgeon: Corey Skains, MD;  Location: [facility] ORS;  Service: Cardiovascular;  Laterality: N/A;   ELECTROPHYSIOLOGIC STUDY N/A 05/27/2016   Procedure: CARDIOVERSION;  Surgeon: Corey Skains, MD;  Location: [facility] ORS;  Service: Cardiovascular;  Laterality: N/A;   ESOPHAGOGASTRODUODENOSCOPY  2012   EYE SURGERY     IR CT HEAD LTD  03/10/2020   IR PERCUTANEOUS ART THROMBECTOMY/INFUSION INTRACRANIAL INC DIAG ANGIO  03/10/2020   IR US GUIDE VASC ACCESS RIGHT  03/10/2020   MASTECTOMY Right    Mastectomy Right    MASTECTOMY WITH AXILLARY LYMPH NODE DISSECTION Right 08/09/2019   Procedure: MASTECTOMY WITH AXILLARY LYMPH NODE DISSECTION;  Surgeon: Robert Bellow, MD;  Location: [facility] ORS;  Service: General;  Laterality: Right;   MITRAL VALVE REPLACEMENT     mvp     PACEMAKER PLACEMENT N/A    RADIOLOGY WITH ANESTHESIA N/A 03/10/2020   Procedure: IR WITH ANESTHESIA;  Surgeon: Radiologist,  Medication, MD;  Location: [facility];  Service: Radiology;  Laterality: N/A;   REVERSE SHOULDER ARTHROPLASTY Right 01/07/2018   Procedure: REVERSE SHOULDER ARTHROPLASTY;  Surgeon: Leim Fabry, MD;  Location: [facility] ORS;  Service: Orthopedics;  Laterality: Right;   VSD REPAIR     VSD REPAIR N/A     Prior to Admission medications   Medication Sig Start Date End Date Taking? Authorizing Provider  acetaminophen (TYLENOL) 500 MG tablet Take 750 mg by mouth at bedtime as needed for mild pain. For jaw fracture    [provider]  ADVAIR DISKUS 100-50 MCG/DOSE AEPB Inhale 1 puff into the lungs 2 (two) times daily. 04/15/20   [provider]  B Complex-C (B-COMPLEX WITH VITAMIN C) tablet Take 1 tablet by mouth daily.    [provider]  donepezil (ARICEPT) 5 MG tablet Take 5 mg by mouth at bedtime. 03/21/21   [provider]  ELIQUIS 5 MG TABS tablet Take 5 mg by mouth 2 (two) times daily. 03/05/21   [provider]  ezetimibe (ZETIA) 10 MG tablet Take 10 mg by mouth daily. 02/05/21   [provider]  fluticasone-salmeterol (ADVAIR) 100-50 MCG/ACT AEPB Inhale 1 puff into the lungs 2 (two) times daily.    [provider]  levothyroxine (SYNTHROID) 50 MCG tablet Take 50 mcg by mouth daily before breakfast. 02/05/21   [provider]  Multiple Vitamin (MULTIVITAMIN WITH MINERALS) TABS tablet Take 1 tablet by mouth daily.    [provider]  Multiple Vitamin (MULTIVITAMIN) tablet Take 1 tablet by mouth daily.    [provider]  pravastatin (PRAVACHOL) 40 MG tablet Take 40 mg by mouth at bedtime. 04/02/21   [provider]  predniSONE (DELTASONE) 5 MG tablet Take 5 mg by mouth daily with breakfast.    [provider]  tamoxifen (NOLVADEX) 20 MG tablet TAKE ONE (1) TABLET BY MOUTH ONCE DAILY 02/27/21   Verlon Au, NP  tamoxifen (NOLVADEX) 20 MG tablet Take 20 mg by mouth daily. 02/27/21   [provider]   ursodiol (ACTIGALL) 300 MG capsule Take 600 mg by mouth 2 (two) times daily. 04/02/21   [provider]  venlafaxine XR (EFFEXOR-XR) 37.5 MG 24 hr capsule Take 37.5 mg by mouth daily. 01/14/20   [provider]  VENTOLIN HFA 108 (90 Base) MCG/ACT inhaler Inhale 2 puffs into the lungs every 4 (four) hours as needed. 01/31/21   [provider]  Vitamin D, Ergocalciferol, (DRISDOL) 1.25 MG (50000 UNIT) CAPS capsule Take 50,000 Units by mouth every 14 (fourteen) days. On Sundays    [provider]    Allergies Atorvastatin, Calcium, Calcium carbonate, Fluoxetine, and Hydrochlorothiazide  Family History  Problem Relation Age of Onset   Multiple myeloma Father    Esophageal cancer Brother    Breast cancer Mother        never treated, dx. >50   Esophageal cancer Brother    Breast cancer Daughter 62   Cancer Daughter 81       rare blood cancer    Social History Social History   Tobacco Use   Smoking status: Never   Smokeless tobacco: Never  Vaping Use   Vaping Use: Never used  Substance Use Topics   Alcohol use: Not Currently    Comment: socially   Drug use: Never    Review of Systems  Review of Systems  Unable to perform ROS: Dementia     ____________________________________________   PHYSICAL EXAM:  VITAL SIGNS: ED Triage Vitals  Enc Vitals Group     BP 04/19/21 2022 (!) 140/107     Pulse Rate 04/19/21 2022 76     Resp 04/19/21 2022 (!) 28     Temp 04/19/21 2022 97.7 F (36.5 C)     Temp Source 04/19/21 2022 Oral     SpO2 04/19/21 2022 96 %     Weight 04/19/21 2023 172 lb (78 kg)     Height 04/19/21 2023 _0  (1.575 m)     Head Circumference --      Peak Flow --      Pain Score --      Pain Loc --      Pain Edu? --      Excl. in [place]? --    Vitals:   04/19/21 2200 04/19/21 2221  BP: (!) 143/88   Pulse: 81   Resp: Marland Kitchen)  23   Temp:    SpO2: 93% 94%   Physical Exam Vitals and nursing note reviewed.  Constitutional:       General: She is not in acute distress.    Appearance: She is well-developed.  HENT:     Head: Normocephalic and atraumatic.     Right Ear: External ear normal.     Left Ear: External ear normal.     Nose: Nose normal.  Eyes:     Conjunctiva/sclera: Conjunctivae normal.  Cardiovascular:     Rate and Rhythm: Normal rate and regular rhythm.     Heart sounds: Murmur heard.  Pulmonary:     Effort: Pulmonary effort is normal. No respiratory distress.     Breath sounds: Decreased breath sounds present.  Abdominal:     Palpations: Abdomen is soft.     Tenderness: There is no abdominal tenderness.  Musculoskeletal:     Cervical back: Neck supple.     Right lower leg: Edema present.     Left lower leg: Edema present.  Skin:    General: Skin is warm and dry.  Neurological:     Mental Status: She is alert. Mental status is at baseline. She is disoriented and confused.    Scattered bruising over the over the bilateral lower extremities. ____________________________________________   LABS (all labs ordered are listed, but only abnormal results are displayed)  Labs Reviewed  BASIC METABOLIC PANEL - Abnormal; Notable for the following components:      Result Value   Creatinine, Ser 1.22 (*)    GFR, Estimated 46 (*)    All other components within normal limits  BRAIN NATRIURETIC PEPTIDE - Abnormal; Notable for the following components:   B Natriuretic Peptide 558.3 (*)    All other components within normal limits  TROPONIN I (HIGH SENSITIVITY) - Abnormal; Notable for the following components:   Troponin I (High Sensitivity) 47 (*)    All other components within normal limits  RESP PANEL BY RT-PCR (FLU A&B, COVID) ARPGX2  CBC  MAGNESIUM  TROPONIN I (HIGH SENSITIVITY)   ____________________________________________  EKG  A flutter noted in several leads although not in all leads with a ventricular paced rate of 65, right bundle branch block and some nonspecific ST changes in  anterior and lateral leads. ____________________________________________  RADIOLOGY  ED MD interpretation: Stable cardiomegaly and right-sided small pleural effusion.  No focal consolidation or overt edema.  Prosthetic mitral valve noted.  Official radiology report(s): DG Chest 2 View  Result Date: 04/19/2021 CLINICAL DATA:  Shortness of breath for several days. Hypoxia. EXAM: CHEST - 2 VIEW COMPARISON:  06/08/2016 FINDINGS: Moderate cardiomegaly shows no significant change. 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 A new tiny right pleural effusion is seen. No evidence of acute pulmonary edema or other infiltrate. Calcified granuloma again seen in the right lower lung. Right shoulder prosthesis also noted. IMPRESSION: Stable cardiomegaly. New tiny right pleural effusion. Electronically Signed   By: Marlaine Hind M.D.   On: 04/19/2021 21:10    ____________________________________________   PROCEDURES  Procedure(s) performed (including Critical Care):  .1-3 Lead EKG Interpretation  Date/Time: 04/19/2021 11:37 PM Performed by: Lucrezia Starch, MD Authorized by: Lucrezia Starch, MD     Interpretation: non-specific     ECG rate assessment: normal     Rhythm: atrial flutter     Ectopy: none     ____________________________________________  INITIAL IMPRESSION / ASSESSMENT AND PLAN / ED COURSE      Patient presents with above to history exam for assessment of some increased leg swelling and reported increased dyspnea with exertion and an episode of hypoxia last week after recently being discharged from hospital where she was hospitalized for colitis and was instructed on discharge to stop her Lasix.  On arrival she is tachypneic at 70 with otherwise stable vital signs on room air.  Differential includes acute heart failure exacerbation, PE, acute valvulopathy, ACS, pneumonia and  arrhythmia.  Chest x-ray shows cardiomegaly and new right pleural effusion without overt edema or consolidation.  No pneumothorax.  Lower suspicion for PE as seems patient is compliant with her Eliquis.  CBC shows no leukocytosis and hemoglobin of 12.1 not consistent with acute symptomatic anemia.  BMP shows no significant electrolyte or metabolic derangements.  BNP is elevated at 558.3 compared to 268 2 weeks ago.  This consistent with findings on exam suggestive of volume overload likely causing patient's significant dyspnea on exertion.  ECG has some nonspecific findings and troponin is elevated at 47 which I suspect represents mild demand ischemia.  We will plan to trend this but will not start patient on ASA or heparin especially since he is on Eliquis.  She is also denying any chest pain.  COVID and influenza PCR is negative. Will  Give a dose of IV Lasix and admit to medicine service for further diuresis evaluation and management.     ____________________________________________   FINAL CLINICAL IMPRESSION(S) / ED DIAGNOSES  Final diagnoses:  Acute on chronic congestive heart failure, unspecified heart failure type ([place])  Troponin I above reference range    Medications  furosemide (LASIX) injection 40 mg (has no administration in time range)     ED Discharge Orders     None        Note:  This document was prepared using Dragon voice recognition software and may include unintentional dictation errors.    Lucrezia Starch, MD 04/19/21 [id number]    Lucrezia Starch, MD 04/20/21 [phone]

## 2021-04-19 NOTE — ED Triage Notes (Signed)
PT arrived via POV with daughter, reports shortness of breath for several days, home health checked patient on Thursday had oxygen level of 87%. PT does not wear oxygen.  PT labored on arrival, daughter reports pt has been increasingly short of breath at rest and with exertion.  PT recently admitted to the hospital and was taken off several medications including lasix. Pt has hx of HF.   PT has hx of dementia.   Swelling noted to BLE.

## 2021-04-19 NOTE — H&P (Signed)
History and Physical   Adriana Martin YCX:[id number] DOB: [birth year] DOA: 04/19/2021  PCP: [name], MD (Inactive)   Patient coming from: Home  Chief Complaint: Shortness of breath  HPI: Adriana Martin is a 75 y.o. female with medical history significant of CHF, CVA, CKD 3B, A. fib, anxiety, anemia, breast cancer, COPD, cirrhosis, primary biliary cholangitis,'s dementia, hypertension, hyperlipidemia, hypothyroidism, MGUS, status post pacemaker who presents with ongoing shortness of breath. As above, patient has had shortness of breath for several days.  She also is noted some lower extremity edema.  Some history obtained with assistance of chart review and family.  Home health nurse came by on Thursday and pulse ox was noted to be around 87 and she does not use any home oxygen. Patient was recently admitted earlier this month for colitis and AKI and some of her medicines were held due to the AKI including Lasix.  She has not yet resumed Lasix. (Has also continue to hold lisinopril and diltiazem.)  She has have a history of heart failure. She denies fevers, chills, chest pain, abdominal pain, constipation, diarrhea, nausea, vomiting.   ED Course: Vital signs in the ED significant for blood pressure in the 497W 263Z systolic.  Lab work-up showed BMP with creatinine stable at 1.22.  CBC within normal limits.  Troponin mildly elevated at 47 with repeat pending.  BNP elevated to 558.  Respiratory panel for flu and COVID-negative.  Chest x-ray showed stable cardiomegaly with new tiny right pleural effusion.  Magnesium level pending.  Patient received a dose of IV Lasix in the ED.  Review of Systems: As per HPI otherwise all other systems reviewed and are negative.  Past Medical History:  Diagnosis Date   Anemia    Anxiety    Atrial fibrillation ([place])    B12 deficiency    Breast cancer ([place])    RT Lumpectomy c radiation 2012   Breast cancer (HCC)    CHF (congestive heart failure)  (HCC)    Chronic diastolic CHF (congestive heart failure) (HCC)    Chronic kidney disease    Cirrhosis (HCC)    CKD (chronic kidney disease), stage III (HCC)    COPD (chronic obstructive pulmonary disease) ([place])    Depression    Family history of breast cancer    Family history of esophageal cancer    Family history of multiple myeloma    GERD (gastroesophageal reflux disease)    Headache    Heart murmur    HLD (hyperlipidemia)    HTN (hypertension)    Hyperlipemia    Hyperlipidemia    Hypertension    Hypothyroid    Hypothyroidism    Iron (Fe) deficiency anemia    Pacemaker    Personal history of radiation therapy    Presence of permanent cardiac pacemaker    Shortness of breath dyspnea    Thrombocytopenia (HCC)    Vitamin D deficiency     Past Surgical History:  Procedure Laterality Date   ABDOMINAL HYSTERECTOMY     Partial   ABLATION     APPENDECTOMY     BICEPT TENODESIS Right 01/07/2018   Procedure: BICEPS TENODESIS;  Surgeon: Leim Fabry, MD;  Location: [facility];  Service: Orthopedics;  Laterality: Right;   BREAST BIOPSY Right 2012   BREAST BIOPSY Right 07/21/2019   venus clip, [place] Bx,positive   BREAST LUMPECTOMY Right 2012   positive/rad   BREAST SURGERY     CARDIAC SURGERY     CARDIAC VALVE REPLACEMENT  COLONOSCOPY  2012   ELECTROPHYSIOLOGIC STUDY N/A 01/23/2015   Procedure: CARDIOVERSION;  Surgeon: Corey Skains, MD;  Location: [facility];  Service: Cardiovascular;  Laterality: N/A;   ELECTROPHYSIOLOGIC STUDY N/A 05/27/2016   Procedure: CARDIOVERSION;  Surgeon: Corey Skains, MD;  Location: [facility];  Service: Cardiovascular;  Laterality: N/A;   ESOPHAGOGASTRODUODENOSCOPY  2012   EYE SURGERY     IR CT HEAD LTD  03/10/2020   IR PERCUTANEOUS ART THROMBECTOMY/INFUSION INTRACRANIAL INC DIAG ANGIO  03/10/2020   IR US GUIDE VASC ACCESS RIGHT  03/10/2020   MASTECTOMY Right    Mastectomy Right    MASTECTOMY WITH AXILLARY LYMPH NODE DISSECTION Right 08/09/2019    Procedure: MASTECTOMY WITH AXILLARY LYMPH NODE DISSECTION;  Surgeon: Robert Bellow, MD;  Location: [facility];  Service: General;  Laterality: Right;   MITRAL VALVE REPLACEMENT     mvp     PACEMAKER PLACEMENT N/A    RADIOLOGY WITH ANESTHESIA N/A 03/10/2020   Procedure: IR WITH ANESTHESIA;  Surgeon: Radiologist, Medication, MD;  Location: [facility];  Service: Radiology;  Laterality: N/A;   REVERSE SHOULDER ARTHROPLASTY Right 01/07/2018   Procedure: REVERSE SHOULDER ARTHROPLASTY;  Surgeon: Leim Fabry, MD;  Location: [facility];  Service: Orthopedics;  Laterality: Right;   VSD REPAIR     VSD REPAIR N/A     Social History  reports that she has never smoked. She has never used smokeless tobacco. She reports previous alcohol use. She reports that she does not use drugs.  Allergies  Allergen Reactions   Atorvastatin Shortness Of Breath   Calcium Shortness Of Breath   Calcium Carbonate     Other reaction(s): Unknown   Fluoxetine     Other reaction(s): Unknown   Hydrochlorothiazide     Other reaction(s): Unknown    Family History  Problem Relation Age of Onset   Multiple myeloma Father    Esophageal cancer Brother    Breast cancer Mother        never treated, dx. >50   Esophageal cancer Brother    Breast cancer Daughter 3   Cancer Daughter 63       rare blood cancer  Reviewed on admission  Prior to Admission medications   Medication Sig Start Date End Date Taking? Authorizing Provider  ADVAIR DISKUS 100-50 MCG/DOSE AEPB Inhale 1 puff into the lungs 2 (two) times daily. 04/15/20  Yes [provider]  B Complex-C (B-COMPLEX WITH VITAMIN C) tablet Take 1 tablet by mouth daily.   Yes [provider]  donepezil (ARICEPT) 5 MG tablet Take 5 mg by mouth at bedtime. 03/21/21  Yes [provider]  ELIQUIS 5 MG TABS tablet Take 5 mg by mouth 2 (two) times daily. 03/05/21  Yes [provider]  ezetimibe (ZETIA) 10 MG tablet Take 10 mg by mouth daily. 02/05/21   Yes [provider]  fluticasone-salmeterol (ADVAIR) 100-50 MCG/ACT AEPB Inhale 1 puff into the lungs 2 (two) times daily.   Yes [provider]  levothyroxine (SYNTHROID) 50 MCG tablet Take 50 mcg by mouth daily before breakfast. 02/05/21  Yes [provider]  Multiple Vitamin (MULTIVITAMIN) tablet Take 1 tablet by mouth daily.   Yes [provider]  pravastatin (PRAVACHOL) 40 MG tablet Take 40 mg by mouth at bedtime. 04/02/21  Yes [provider]  predniSONE (DELTASONE) 5 MG tablet Take 5 mg by mouth daily with breakfast.   Yes [provider]  tamoxifen (NOLVADEX) 20 MG tablet TAKE ONE (1)  TABLET BY MOUTH ONCE DAILY 02/27/21  Yes Verlon Au, NP  tamoxifen (NOLVADEX) 20 MG tablet Take 20 mg by mouth daily. 02/27/21  Yes [provider]  ursodiol (ACTIGALL) 300 MG capsule Take 600 mg by mouth 2 (two) times daily. 04/02/21  Yes [provider]  venlafaxine XR (EFFEXOR-XR) 37.5 MG 24 hr capsule Take 37.5 mg by mouth daily. 01/14/20  Yes [provider]  VENTOLIN HFA 108 (90 Base) MCG/ACT inhaler Inhale 2 puffs into the lungs every 4 (four) hours as needed. 01/31/21  Yes [provider]  Vitamin D, Ergocalciferol, (DRISDOL) 1.25 MG (50000 UNIT) CAPS capsule Take 50,000 Units by mouth every 14 (fourteen) days. On Sundays   Yes [provider]  acetaminophen (TYLENOL) 500 MG tablet Take 750 mg by mouth at bedtime as needed for mild pain. For jaw fracture    [provider]    Physical Exam: Vitals:   04/19/21 2022 04/19/21 2023 04/19/21 2200 04/19/21 2221  BP: (!) 140/107  (!) 143/88   Pulse: 76  81   Resp: (!) 28  (!) 23   Temp: 97.7 F (36.5 C)     TempSrc: Oral     SpO2: 96%  93% 94%  Weight:  78 kg    Height:  $Remove'5\' 2"'[id number]$  (1.575 m)     Physical Exam Constitutional:      General: She is not in acute distress.    Appearance: Normal appearance.  HENT:     Head: Normocephalic and atraumatic.      Mouth/Throat:     Mouth: Mucous membranes are moist.     Pharynx: Oropharynx is clear.  Eyes:     Extraocular Movements: Extraocular movements intact.     Pupils: Pupils are equal, round, and reactive to light.  Cardiovascular:     Rate and Rhythm: Normal rate and regular rhythm.     Pulses: Normal pulses.     Heart sounds: No murmur heard. Pulmonary:     Effort: Pulmonary effort is normal. No respiratory distress.     Breath sounds: Rales (Trace basilar) present.  Abdominal:     General: Bowel sounds are normal. There is no distension.     Palpations: Abdomen is soft.     Tenderness: There is no abdominal tenderness.  Musculoskeletal:        General: No swelling or deformity.     Right lower leg: Edema present.     Left lower leg: Edema present.  Skin:    General: Skin is warm and dry.  Neurological:     General: No focal deficit present.     Mental Status: Mental status is at baseline.    Labs on Admission: I have personally reviewed following labs and imaging studies  CBC: Recent Labs  Lab 04/19/21 2152  WBC 9.6  HGB 12.1  HCT 39.6  MCV 98.0  PLT 998    Basic Metabolic Panel: Recent Labs  Lab 04/19/21 2152  NA 136  K 3.6  CL 100  CO2 25  GLUCOSE 82  BUN 23  CREATININE 1.22*  CALCIUM 9.1    GFR: Estimated Creatinine Clearance: 38.6 mL/min (A) (by C-G formula based on SCr of 1.22 mg/dL (H)).  Liver Function Tests: No results for input(s): AST, ALT, ALKPHOS, BILITOT, PROT, ALBUMIN in the last 168 hours.  Urine analysis:    Component Value Date/Time   COLORURINE YELLOW (A) 12/28/2017 1521   APPEARANCEUR CLEAR (A) 12/28/2017 1521   APPEARANCEUR Clear 06/09/2013  2122   LABSPEC 1.010 12/28/2017 1521   LABSPEC 1.004 06/09/2013 2122   PHURINE 5.0 12/28/2017 1521   GLUCOSEU NEGATIVE 12/28/2017 1521   GLUCOSEU Negative 06/09/2013 2122   HGBUR NEGATIVE 12/28/2017 [place] 12/28/2017 1521   BILIRUBINUR Negative 06/09/2013 2122    KETONESUR NEGATIVE 12/28/2017 1521   PROTEINUR NEGATIVE 12/28/2017 1521   NITRITE NEGATIVE 12/28/2017 1521   LEUKOCYTESUR SMALL (A) 12/28/2017 1521   LEUKOCYTESUR Negative 06/09/2013 2122    Radiological Exams on Admission: DG Chest 2 View  Result Date: 04/19/2021 CLINICAL DATA:  Shortness of breath for several days. Hypoxia. EXAM: CHEST - 2 VIEW COMPARISON:  06/08/2016 FINDINGS: Moderate cardiomegaly shows no significant change. 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 A new tiny right pleural effusion is seen. No evidence of acute pulmonary edema or other infiltrate. Calcified granuloma again seen in the right lower lung. Right shoulder prosthesis also noted. IMPRESSION: Stable cardiomegaly. New tiny right pleural effusion. Electronically Signed   By: Marlaine Hind M.D.   On: 04/19/2021 21:10    EKG: Independently reviewed.  A flutter with right bundle branch block and pacemaker spikes.  Assessment/Plan Principal Problem:   Acute exacerbation of CHF (congestive heart failure) (HCC) Active Problems:   Anxiety   Primary biliary cholangitis (HCC)   A-fib (HCC)   Hyperlipidemia   HTN (hypertension)   COPD (chronic obstructive pulmonary disease) (HCC)   HLD (hyperlipidemia)   Hypothyroid   Cirrhosis (HCC)   Breast cancer (HCC)   Dementia (HCC)   CHF exacerbation (HCC)  CHF exacerbation > Patient presented with shortness of breath and lower extremity edema.  Recently Lasix have been held due to AKI and not yet resumed. > Last echo was earlier this month with EF 60-65% and indeterminate diastolic parameters.  Normal RV function. > BNP elevated to 558 in the ED.  With troponin 47 with repeat pending.  Creatinine stable. - Monitor on telemetry - Lasix 40 mg twice daily - Strict I's/O's, daily weights - Trend renal function and electrolytes  CKD 3B - Avoid nephrotoxic agents - Trend renal  function and electrolytes  Hypertension - Blood pressure in the 140s to 160s in the ED - Receiving Lasix as above - Has continued to hold home lisinopril and   Atrial fibrillation - Continue home Eliquis  COPD - Replace home Advair with formulary Dulera - Continue home albuterol as needed  Hyperlipidemia - Continue home pravastatin and Zetia  Hypothyroidism - Continue home Synthroid  Cirrhosis Primary biliary cholangitis - Continue home ursodiol  Dementia - Continue home donepezil  Breast cancer - Continue tamoxifen  Anxiety - Continue home venlafaxine  DVT prophylaxis: Eliquis  Code Status:   Full  Family Communication:  Daughter updated by phone  Disposition Plan:   Patient is from:  Home  Anticipated DC to:  Home  Anticipated DC date:  1 to 2 days  Anticipated DC barriers: None  Consults called:  None  Admission status:  Observation, telemetry  Severity of Illness: The appropriate patient status for this patient is OBSERVATION. Observation status is judged to be reasonable and necessary in order to provide the required intensity of service to ensure the patient's safety. The patient's presenting symptoms, physical exam findings, and initial radiographic and laboratory data in the context of their medical condition is felt to place them at decreased risk for further clinical deterioration. Furthermore, it is anticipated  that the patient will be medically stable for discharge from the hospital within 2 midnights of admission. The following factors support the patient status of observation.   " The patient's presenting symptoms include shortness of breath, edema. " The physical exam findings include edema, rales. " The initial radiographic and laboratory data are Lab work-up showed BMP with creatinine stable at 1.22.  CBC within normal limits.  Troponin mildly elevated at 47 with repeat pending.  BNP elevated to 558.  Respiratory panel for flu and COVID-negative.   Chest x-ray showed stable cardiomegaly with new tiny right pleural effusion.  Magnesium level pending.   Marcelyn Bruins MD [facility]  How to contact the [facility] Attending or Consulting provider [place] or covering provider during after hours [place], for this patient?   Check the care team in [facility] and look for a) attending/consulting [facility] provider listed and b) the [facility] team listed Log into [email or web address] and use [place]'s universal password to access. If you do not have the password, please contact the hospital operator. Locate the [facility] provider you are looking for under [facility] and page to a number that you can be directly reached. If you still have difficulty reaching the provider, please page the [facility] (Director on Call) for the Hospitalists listed on amion for assistance.  04/19/2021, 11:57 PM

## 2021-04-19 NOTE — ED Notes (Signed)
Labs not drawn.  Pt to xray

## 2021-04-20 ENCOUNTER — Encounter: Payer: Self-pay | Admitting: Internal Medicine

## 2021-04-20 DIAGNOSIS — I482 Chronic atrial fibrillation, unspecified: Secondary | ICD-10-CM | POA: Diagnosis not present

## 2021-04-20 DIAGNOSIS — K743 Primary biliary cirrhosis: Secondary | ICD-10-CM | POA: Diagnosis present

## 2021-04-20 DIAGNOSIS — E039 Hypothyroidism, unspecified: Secondary | ICD-10-CM | POA: Diagnosis present

## 2021-04-20 DIAGNOSIS — I4891 Unspecified atrial fibrillation: Secondary | ICD-10-CM

## 2021-04-20 DIAGNOSIS — I248 Other forms of acute ischemic heart disease: Secondary | ICD-10-CM | POA: Diagnosis present

## 2021-04-20 DIAGNOSIS — J9601 Acute respiratory failure with hypoxia: Secondary | ICD-10-CM | POA: Diagnosis not present

## 2021-04-20 DIAGNOSIS — E785 Hyperlipidemia, unspecified: Secondary | ICD-10-CM | POA: Diagnosis present

## 2021-04-20 DIAGNOSIS — I509 Heart failure, unspecified: Secondary | ICD-10-CM

## 2021-04-20 DIAGNOSIS — I472 Ventricular tachycardia: Secondary | ICD-10-CM | POA: Diagnosis present

## 2021-04-20 DIAGNOSIS — R1013 Epigastric pain: Secondary | ICD-10-CM | POA: Diagnosis not present

## 2021-04-20 DIAGNOSIS — I462 Cardiac arrest due to underlying cardiac condition: Secondary | ICD-10-CM | POA: Diagnosis present

## 2021-04-20 DIAGNOSIS — Z888 Allergy status to other drugs, medicaments and biological substances status: Secondary | ICD-10-CM | POA: Diagnosis not present

## 2021-04-20 DIAGNOSIS — N1832 Chronic kidney disease, stage 3b: Secondary | ICD-10-CM | POA: Diagnosis present

## 2021-04-20 DIAGNOSIS — I469 Cardiac arrest, cause unspecified: Secondary | ICD-10-CM

## 2021-04-20 DIAGNOSIS — E876 Hypokalemia: Secondary | ICD-10-CM | POA: Diagnosis present

## 2021-04-20 DIAGNOSIS — Z950 Presence of cardiac pacemaker: Secondary | ICD-10-CM | POA: Diagnosis not present

## 2021-04-20 DIAGNOSIS — I4892 Unspecified atrial flutter: Secondary | ICD-10-CM | POA: Diagnosis present

## 2021-04-20 DIAGNOSIS — I4821 Permanent atrial fibrillation: Secondary | ICD-10-CM | POA: Diagnosis present

## 2021-04-20 DIAGNOSIS — I4901 Ventricular fibrillation: Secondary | ICD-10-CM | POA: Diagnosis present

## 2021-04-20 DIAGNOSIS — N179 Acute kidney failure, unspecified: Secondary | ICD-10-CM | POA: Diagnosis present

## 2021-04-20 DIAGNOSIS — F0390 Unspecified dementia without behavioral disturbance: Secondary | ICD-10-CM | POA: Diagnosis present

## 2021-04-20 DIAGNOSIS — J9621 Acute and chronic respiratory failure with hypoxia: Secondary | ICD-10-CM | POA: Diagnosis present

## 2021-04-20 DIAGNOSIS — Z7952 Long term (current) use of systemic steroids: Secondary | ICD-10-CM | POA: Diagnosis not present

## 2021-04-20 DIAGNOSIS — Z7901 Long term (current) use of anticoagulants: Secondary | ICD-10-CM | POA: Diagnosis not present

## 2021-04-20 DIAGNOSIS — Z79899 Other long term (current) drug therapy: Secondary | ICD-10-CM | POA: Diagnosis not present

## 2021-04-20 DIAGNOSIS — I5033 Acute on chronic diastolic (congestive) heart failure: Secondary | ICD-10-CM | POA: Diagnosis present

## 2021-04-20 DIAGNOSIS — Z20822 Contact with and (suspected) exposure to covid-19: Secondary | ICD-10-CM | POA: Diagnosis present

## 2021-04-20 DIAGNOSIS — J449 Chronic obstructive pulmonary disease, unspecified: Secondary | ICD-10-CM | POA: Diagnosis present

## 2021-04-20 DIAGNOSIS — I130 Hypertensive heart and chronic kidney disease with heart failure and stage 1 through stage 4 chronic kidney disease, or unspecified chronic kidney disease: Secondary | ICD-10-CM | POA: Diagnosis present

## 2021-04-20 DIAGNOSIS — F419 Anxiety disorder, unspecified: Secondary | ICD-10-CM | POA: Diagnosis present

## 2021-04-20 LAB — COMPREHENSIVE METABOLIC PANEL
ALT: 159 U/L — ABNORMAL HIGH (ref 0–44)
ALT: 191 U/L — ABNORMAL HIGH (ref 0–44)
AST: 117 U/L — ABNORMAL HIGH (ref 15–41)
AST: 181 U/L — ABNORMAL HIGH (ref 15–41)
Albumin: 3.1 g/dL — ABNORMAL LOW (ref 3.5–5.0)
Albumin: 2.9 g/dL — ABNORMAL LOW (ref 3.5–5.0)
Alkaline Phosphatase: 60 U/L (ref 38–126)
Alkaline Phosphatase: 61 U/L (ref 38–126)
Anion gap: 10 (ref 5–15)
Anion gap: 16 — ABNORMAL HIGH (ref 5–15)
BUN: 22 mg/dL (ref 8–23)
BUN: 21 mg/dL (ref 8–23)
CO2: 27 mmol/L (ref 22–32)
CO2: 24 mmol/L (ref 22–32)
Calcium: 8.9 mg/dL (ref 8.9–10.3)
Calcium: 8.6 mg/dL — ABNORMAL LOW (ref 8.9–10.3)
Chloride: 100 mmol/L (ref 98–111)
Chloride: 96 mmol/L — ABNORMAL LOW (ref 98–111)
Creatinine, Ser: 1.08 mg/dL — ABNORMAL HIGH (ref 0.44–1.00)
Creatinine, Ser: 1.33 mg/dL — ABNORMAL HIGH (ref 0.44–1.00)
GFR, Estimated: 54 mL/min — ABNORMAL LOW (ref >60)
GFR, Estimated: 42 mL/min — ABNORMAL LOW (ref >60)
Glucose, Bld: 98 mg/dL (ref 70–99)
Glucose, Bld: 181 mg/dL — ABNORMAL HIGH (ref 70–99)
Potassium: 3 mmol/L — ABNORMAL LOW (ref 3.5–5.1)
Potassium: 3.4 mmol/L — ABNORMAL LOW (ref 3.5–5.1)
Sodium: 137 mmol/L (ref 135–145)
Sodium: 136 mmol/L (ref 135–145)
Total Bilirubin: 0.9 mg/dL (ref 0.3–1.2)
Total Bilirubin: 0.9 mg/dL (ref 0.3–1.2)
Total Protein: 6.7 g/dL (ref 6.5–8.1)
Total Protein: 6.5 g/dL (ref 6.5–8.1)

## 2021-04-20 LAB — BLOOD GAS, ARTERIAL
Acid-Base Excess: 3.3 mmol/L — ABNORMAL HIGH (ref 0.0–2.0)
Allens test (pass/fail): POSITIVE — AB
Bicarbonate: 26.8 mmol/L (ref 20.0–28.0)
FIO2: 0.4
MECHVT: 450 mL
O2 Saturation: 99.4 %
PEEP: 5 cmH2O
Patient temperature: 37
RATE: 16 resp/min
pCO2 arterial: 36 mmHg (ref 32.0–48.0)
pH, Arterial: 7.48 — ABNORMAL HIGH (ref 7.350–7.450)
pO2, Arterial: 144 mmHg — ABNORMAL HIGH (ref 83.0–108.0)

## 2021-04-20 LAB — GLUCOSE, CAPILLARY: Glucose-Capillary: 180 mg/dL — ABNORMAL HIGH (ref 70–99)

## 2021-04-20 LAB — TROPONIN I (HIGH SENSITIVITY)
Troponin I (High Sensitivity): 51 ng/L — ABNORMAL HIGH (ref <18)
Troponin I (High Sensitivity): 82 ng/L — ABNORMAL HIGH (ref <18)

## 2021-04-20 LAB — MAGNESIUM
Magnesium: 1.9 mg/dL (ref 1.7–2.4)
Magnesium: 2.1 mg/dL (ref 1.7–2.4)
Magnesium: 2.2 mg/dL (ref 1.7–2.4)

## 2021-04-20 LAB — PHOSPHORUS: Phosphorus: 4.1 mg/dL (ref 2.5–4.6)

## 2021-04-20 LAB — LACTIC ACID, PLASMA: Lactic Acid, Venous: 5.5 mmol/L (ref 0.5–1.9)

## 2021-04-20 LAB — POTASSIUM: Potassium: 5.1 mmol/L (ref 3.5–5.1)

## 2021-04-20 MED ORDER — AMIODARONE HCL IN DEXTROSE 360-4.14 MG/200ML-% IV SOLN
60.0000 mg/h | INTRAVENOUS | Status: AC
  Administered 2021-04-20: 60 mg/h via INTRAVENOUS
  Filled 2021-04-20 (×2): qty 200

## 2021-04-20 MED ORDER — FENTANYL CITRATE (PF) 100 MCG/2ML IJ SOLN
100.0000 ug | INTRAMUSCULAR | Status: DC | PRN
  Administered 2021-04-20: 100 ug via INTRAVENOUS
  Filled 2021-04-20: qty 2

## 2021-04-20 MED ORDER — AMIODARONE HCL IN DEXTROSE 360-4.14 MG/200ML-% IV SOLN
30.0000 mg/h | INTRAVENOUS | Status: DC
  Administered 2021-04-20 – 2021-04-21 (×2): 30 mg/h via INTRAVENOUS
  Filled 2021-04-20: qty 200

## 2021-04-20 MED ORDER — POTASSIUM CHLORIDE 10 MEQ/100ML IV SOLN
10.0000 meq | INTRAVENOUS | Status: AC
  Administered 2021-04-20 (×4): 10 meq via INTRAVENOUS
  Filled 2021-04-20 (×4): qty 100

## 2021-04-20 MED ORDER — FENTANYL CITRATE (PF) 100 MCG/2ML IJ SOLN
INTRAMUSCULAR | Status: AC
  Administered 2021-04-20: 100 ug via INTRAVENOUS
  Filled 2021-04-20: qty 2

## 2021-04-20 MED ORDER — NOREPINEPHRINE 4 MG/250ML-% IV SOLN
INTRAVENOUS | Status: AC
  Administered 2021-04-20: 4 mg
  Filled 2021-04-20: qty 250

## 2021-04-20 MED ORDER — MAGNESIUM SULFATE 2 GM/50ML IV SOLN
2.0000 g | Freq: Once | INTRAVENOUS | Status: AC
  Administered 2021-04-20: 2 g via INTRAVENOUS
  Filled 2021-04-20: qty 50

## 2021-04-20 MED ORDER — DEXMEDETOMIDINE HCL IN NACL 200 MCG/50ML IV SOLN
0.4000 ug/kg/h | INTRAVENOUS | Status: DC
Start: 2021-04-20 — End: 2021-04-20
  Administered 2021-04-20: 1.2 ug/kg/h via INTRAVENOUS
  Filled 2021-04-20: qty 50

## 2021-04-20 MED ORDER — PROPOFOL 1000 MG/100ML IV EMUL
INTRAVENOUS | Status: AC
  Filled 2021-04-20: qty 100

## 2021-04-20 MED ORDER — POTASSIUM CHLORIDE CRYS ER 20 MEQ PO TBCR
40.0000 meq | EXTENDED_RELEASE_TABLET | Freq: Three times a day (TID) | ORAL | Status: DC
  Administered 2021-04-20 – 2021-04-21 (×3): 40 meq via ORAL
  Filled 2021-04-20 (×3): qty 2

## 2021-04-20 MED ORDER — AMIODARONE IV BOLUS ONLY 150 MG/100ML
INTRAVENOUS | Status: AC
  Administered 2021-04-20: 150 mg
  Filled 2021-04-20: qty 100

## 2021-04-20 MED ORDER — CHLORHEXIDINE GLUCONATE CLOTH 2 % EX PADS
6.0000 | MEDICATED_PAD | Freq: Every day | CUTANEOUS | Status: DC
Start: 2021-04-20 — End: 2021-04-22
  Administered 2021-04-20 – 2021-04-21 (×2): 6 via TOPICAL

## 2021-04-20 MED ORDER — PROPOFOL 1000 MG/100ML IV EMUL: 5.0000 ug/kg/min | INTRAVENOUS | Status: DC

## 2021-04-20 MED ORDER — MAGNESIUM SULFATE 2 GM/50ML IV SOLN
2.0000 g | Freq: Once | INTRAVENOUS | Status: DC
  Filled 2021-04-20: qty 50

## 2021-04-20 MED ORDER — ALBUTEROL SULFATE (2.5 MG/3ML) 0.083% IN NEBU: 2.5000 mg | INHALATION_SOLUTION | RESPIRATORY_TRACT | Status: DC | PRN

## 2021-04-20 MED ORDER — DEXMEDETOMIDINE HCL IN NACL 400 MCG/100ML IV SOLN: 0.4000 ug/kg/h | INTRAVENOUS | Status: DC

## 2021-04-20 MED ORDER — PANTOPRAZOLE SODIUM 40 MG IV SOLR
40.0000 mg | Freq: Every day | INTRAVENOUS | Status: DC
  Administered 2021-04-20 – 2021-04-21 (×2): 40 mg via INTRAVENOUS
  Filled 2021-04-20 (×2): qty 40

## 2021-04-20 MED ORDER — POTASSIUM CHLORIDE 10 MEQ/100ML IV SOLN
10.0000 meq | INTRAVENOUS | Status: DC
Start: 2021-04-20 — End: 2021-04-20
  Filled 2021-04-20 (×4): qty 100

## 2021-04-20 MED ORDER — NOREPINEPHRINE 4 MG/250ML-% IV SOLN: 2.0000 ug/min | INTRAVENOUS | Status: DC

## 2021-04-20 MED ORDER — SODIUM CHLORIDE 0.9 % IV SOLN
250.0000 mL | INTRAVENOUS | Status: DC
  Administered 2021-04-20: 250 mL via INTRAVENOUS

## 2021-04-20 MED FILL — Medication: Qty: 1 | Status: AC

## 2021-04-20 NOTE — Progress Notes (Signed)
Pt. Extubated to 2 lnc,sat 96%. No apparent distress noted at this time.

## 2021-04-20 NOTE — Progress Notes (Signed)
PT Cancellation Note  Patient Details Name: Adriana Martin MRN: [id number] DOB: 06-16-1946   Cancelled Treatment:    Reason Eval/Treat Not Completed: Medical issues which prohibited therapy Pt with Code Blue called, sent to CCU and intubated. Will complete PT orders.  Will need new orders when appropriate for activity with PT.   Kreg Shropshire, DPT 04/20/2021, 1:55 PM

## 2021-04-20 NOTE — Progress Notes (Signed)
Chaplain responded to Code Blue. No family is present.  Please contact if support is needed.    [place], [place] [phone]    04/20/21 1000  Clinical Encounter Type  Visited With Patient not available  Visit Type Initial;Code  Referral From Nurse  Consult/Referral To Chaplain  Stress Factors  Patient Stress Factors Health changes

## 2021-04-20 NOTE — Consult Note (Signed)
NAME:  Adriana Martin, MRN:  [id number], DOB:  10-Apr-1946, LOS: 0 ADMISSION DATE:  04/19/2021, CONSULTATION DATE: 04/20/2021 REFERRING MD: Dr. Sloan Leiter CHIEF COMPLAINT: Cardiac arrest  History of Present Illness:  75 year old female with HFpEF, CKD stage IIIb, paroxysmal A. fib and history of breast cancer who was admitted with acute on chronic diastolic congestive heart failure under hospitalist care after she presented with increasing shortness of breath in the setting of not restarting diuretic therapy.  Until this morning she was doing okay, she spoke with her physician, RN got a call from telemetry that patient is having V. tach, patient was noted to be cyanotic and lost her pulse, CPR was started per ACLS protocol, ROSC was achieved after 2 minutes.  She received 150 mg of IV amiodarone bolus and 1 electrical shock. Patient was intubated and was transferred to ICU for further management  Of note patient serum potassium was 3.0 early this morning, she received 40 mEq p.o. potassium and 40 mg of IV Lasix.  Pertinent  Medical History   Past Medical History:  Diagnosis Date   Anemia    Anxiety    Atrial fibrillation ([place])    B12 deficiency    Breast cancer ([place])    RT Lumpectomy c radiation 2012   Breast cancer (HCC)    CHF (congestive heart failure) (HCC)    Chronic diastolic CHF (congestive heart failure) (HCC)    Chronic kidney disease    Cirrhosis (HCC)    CKD (chronic kidney disease), stage III (HCC)    COPD (chronic obstructive pulmonary disease) ([place])    Depression    Family history of breast cancer    Family history of esophageal cancer    Family history of multiple myeloma    GERD (gastroesophageal reflux disease)    Headache    Heart murmur    HLD (hyperlipidemia)    HTN (hypertension)    Hyperlipemia    Hyperlipidemia    Hypertension    Hypothyroid    Hypothyroidism    Iron (Fe) deficiency anemia    Pacemaker    Personal history of radiation therapy     Presence of permanent cardiac pacemaker    Shortness of breath dyspnea    Thrombocytopenia (HCC)    Vitamin D deficiency      Significant Hospital Events: Including procedures, antibiotic start and stop dates in addition to other pertinent events   7/30 admitted 7/31 went into V. tach cardiac arrest, ROSC achieved, intubated and transferred to ICU  Interim History / Subjective:    Objective   Blood pressure 140/67, pulse 60, temperature 98.4 F (36.9 C), temperature source Oral, resp. rate 20, height _0  (1.575 m), weight 78 kg, SpO2 92 %.        Intake/Output Summary (Last 24 hours) at 04/20/2021 1047 Last data filed at 04/20/2021 0800 Gross per 24 hour  Intake --  Output 1600 ml  Net -1600 ml   Filed Weights   04/19/21 2023  Weight: 78 kg    Examination:   Physical exam: General: Crtitically ill-appearing elderly Caucasian female, orally intubated HEENT: [place]/AT, eyes anicteric.  ETT and OGT in place Neuro: Opens eyes, not following commands, does not track examiner, moving all 4 extremities. Chest: Bilateral basal crackles, no wheezes or rhonchi Heart: Regular rate and rhythm, no murmurs or gallops Abdomen: Soft, nontender, nondistended, bowel sounds present Skin: No rash  Resolved Hospital Problem list     Assessment & Plan:  Status post V.  tach cardiac arrest could be in the setting of hypokalemia versus diastolic congestive heart failure Acute hypoxic respiratory failure postcardiac arrest Acute on chronic diastolic congestive heart failure Hypokalemia Paroxysmal A. Fib CKD stage IIIb Hypertension Hyperlipidemia History of breast cancer status post-lumpectomy and radiation therapy  Patient underwent V. tach cardiac arrest, received defibrillation x1, ROSC was achieved after 2 minutes of CPR per ACLS protocol She received 150 mg of IV amiodarone bolus Likely cause of her V. tach arrest was hypokalemia Will ask cardiology consult She presented initially  with increasing shortness of breath, was admitted 2 weeks ago for AKI, Lasix was held, she did not resume Lasix Continue diuretic therapy with Lasix 40 mg twice daily Monitor intake and output Closely monitor serum creatinine and electrolytes and supplement as tolerated Continue lung protective ventilation Continue Precedex with RASS goal 0/-1 Patient is in sinus rhythm at the moment Continue apixaban for secondary stroke prophylaxis Serum creatinine is at baseline   Best Practice (right click and "Reselect all SmartList Selections" daily)   Diet/type: NPO DVT prophylaxis: DOAC GI prophylaxis: PPI Lines: N/A Foley:  Yes, and it is still needed Code Status:  full code Last date of multidisciplinary goals of care discussion [pending]  Labs   CBC: Recent Labs  Lab 04/19/21 2152  WBC 9.6  HGB 12.1  HCT 39.6  MCV 98.0  PLT 353    Basic Metabolic Panel: Recent Labs  Lab 04/19/21 2152 04/19/21 2358 04/20/21 0345  NA 136  --  137  K 3.6  --  3.0*  CL 100  --  100  CO2 25  --  27  GLUCOSE 82  --  98  BUN 23  --  22  CREATININE 1.22*  --  1.08*  CALCIUM 9.1  --  8.9  MG  --  2.1  --    GFR: Estimated Creatinine Clearance: 43.6 mL/min (A) (by C-G formula based on SCr of 1.08 mg/dL (H)). Recent Labs  Lab 04/19/21 2152  WBC 9.6    Liver Function Tests: Recent Labs  Lab 04/20/21 0345  AST 117*  ALT 159*  ALKPHOS 60  BILITOT 0.9  PROT 6.7  ALBUMIN 3.1*   No results for input(s): LIPASE, AMYLASE in the last 168 hours. No results for input(s): AMMONIA in the last 168 hours.  ABG    Component Value Date/Time   HCO3 27.7 06/07/2016 0350   O2SAT 83.0 06/07/2016 0350     Coagulation Profile: No results for input(s): INR, PROTIME in the last 168 hours.  Cardiac Enzymes: No results for input(s): CKTOTAL, CKMB, CKMBINDEX, TROPONINI in the last 168 hours.  HbA1C: Hemoglobin A1C  Date/Time Value Ref Range Status  06/09/2013 06:45 PM 5.9 4.2 - 6.3 % Final     Comment:    The American Diabetes Association recommends that a primary goal of therapy should be <7% and that physicians should reevaluate the treatment regimen in patients with HbA1c values consistently >8%.   03/20/2013 08:38 AM 5.1 4.2 - 6.3 % Final    Comment:    The American Diabetes Association recommends that a primary goal of therapy should be <7% and that physicians should reevaluate the treatment regimen in patients with HbA1c values consistently >8%.    Hgb A1c MFr Bld  Date/Time Value Ref Range Status  03/11/2020 05:52 AM 5.7 (H) 4.8 - 5.6 % Final    Comment:    (NOTE)         Prediabetes: 5.7 - 6.4  Diabetes: >6.4         Glycemic control for adults with diabetes: <7.0   03/22/2016 11:33 AM 6.1 (H) 4.0 - 6.0 % Final    CBG: No results for input(s): GLUCAP in the last 168 hours.  Review of Systems:   Unable to obtain as patient is intubated  Past Medical History:  She,  has a past medical history of Anemia, Anxiety, Atrial fibrillation ([place]), B12 deficiency, Breast cancer ([place]), Breast cancer ([place]), CHF (congestive heart failure) ([place]), Chronic diastolic CHF (congestive heart failure) ([place]), Chronic kidney disease, Cirrhosis ([place]), CKD (chronic kidney disease), stage III ([place]), COPD (chronic obstructive pulmonary disease) ([place]), Depression, Family history of breast cancer, Family history of esophageal cancer, Family history of multiple myeloma, GERD (gastroesophageal reflux disease), Headache, Heart murmur, HLD (hyperlipidemia), HTN (hypertension), Hyperlipemia, Hyperlipidemia, Hypertension, Hypothyroid, Hypothyroidism, Iron (Fe) deficiency anemia, Pacemaker, Personal history of radiation therapy, Presence of permanent cardiac pacemaker, Shortness of breath dyspnea, Thrombocytopenia ([place]), and Vitamin D deficiency.   Surgical History:   Past Surgical History:  Procedure Laterality Date   ABDOMINAL HYSTERECTOMY     Partial   ABLATION     APPENDECTOMY      BICEPT TENODESIS Right 01/07/2018   Procedure: BICEPS TENODESIS;  Surgeon: Leim Fabry, MD;  Location: [facility]S;  Service: Orthopedics;  Laterality: Right;   BREAST BIOPSY Right 2012   BREAST BIOPSY Right 07/21/2019   venus clip, [place] Bx,positive   BREAST LUMPECTOMY Right 2012   positive/rad   BREAST SURGERY     CARDIAC SURGERY     CARDIAC VALVE REPLACEMENT     COLONOSCOPY  2012   ELECTROPHYSIOLOGIC STUDY N/A 01/23/2015   Procedure: CARDIOVERSION;  Surgeon: Corey Skains, MD;  Location: [facility];  Service: Cardiovascular;  Laterality: N/A;   ELECTROPHYSIOLOGIC STUDY N/A 05/27/2016   Procedure: CARDIOVERSION;  Surgeon: Corey Skains, MD;  Location: [facility];  Service: Cardiovascular;  Laterality: N/A;   ESOPHAGOGASTRODUODENOSCOPY  2012   EYE SURGERY     IR CT HEAD LTD  03/10/2020   IR PERCUTANEOUS ART THROMBECTOMY/INFUSION INTRACRANIAL INC DIAG ANGIO  03/10/2020   IR US GUIDE VASC ACCESS RIGHT  03/10/2020   MASTECTOMY Right    Mastectomy Right    MASTECTOMY WITH AXILLARY LYMPH NODE DISSECTION Right 08/09/2019   Procedure: MASTECTOMY WITH AXILLARY LYMPH NODE DISSECTION;  Surgeon: Robert Bellow, MD;  Location: [facility];  Service: General;  Laterality: Right;   MITRAL VALVE REPLACEMENT     mvp     PACEMAKER PLACEMENT N/A    RADIOLOGY WITH ANESTHESIA N/A 03/10/2020   Procedure: IR WITH ANESTHESIA;  Surgeon: Radiologist, Medication, MD;  Location: [place];  Service: Radiology;  Laterality: N/A;   REVERSE SHOULDER ARTHROPLASTY Right 01/07/2018   Procedure: REVERSE SHOULDER ARTHROPLASTY;  Surgeon: Leim Fabry, MD;  Location: [facility];  Service: Orthopedics;  Laterality: Right;   VSD REPAIR     VSD REPAIR N/A      Social History:   reports that she has never smoked. She has never used smokeless tobacco. She reports previous alcohol use. She reports that she does not use drugs.   Family History:  Her family history includes Breast cancer in her mother; Breast cancer (age of onset: 6)  in her daughter; Cancer (age of onset: 73) in her daughter; Esophageal cancer in her brother and brother; Multiple myeloma in her father.   Allergies Allergies  Allergen Reactions   Atorvastatin Shortness Of Breath   Calcium Shortness Of Breath  Calcium Carbonate     Other reaction(s): Unknown   Fluoxetine     Other reaction(s): Unknown   Hydrochlorothiazide     Other reaction(s): Unknown     Home Medications  Prior to Admission medications   Medication Sig Start Date End Date Taking? Authorizing Provider  ADVAIR DISKUS 100-50 MCG/DOSE AEPB Inhale 1 puff into the lungs 2 (two) times daily. 04/15/20  Yes [provider]  B Complex-C (B-COMPLEX WITH VITAMIN C) tablet Take 1 tablet by mouth daily.   Yes [provider]  donepezil (ARICEPT) 5 MG tablet Take 5 mg by mouth at bedtime. 03/21/21  Yes [provider]  ELIQUIS 5 MG TABS tablet Take 5 mg by mouth 2 (two) times daily. 03/05/21  Yes [provider]  ezetimibe (ZETIA) 10 MG tablet Take 10 mg by mouth daily. 02/05/21  Yes [provider]  fluticasone-salmeterol (ADVAIR) 100-50 MCG/ACT AEPB Inhale 1 puff into the lungs 2 (two) times daily.   Yes [provider]  levothyroxine (SYNTHROID) 50 MCG tablet Take 50 mcg by mouth daily before breakfast. 02/05/21  Yes [provider]  Multiple Vitamin (MULTIVITAMIN) tablet Take 1 tablet by mouth daily.   Yes [provider]  pravastatin (PRAVACHOL) 40 MG tablet Take 40 mg by mouth at bedtime. 04/02/21  Yes [provider]  predniSONE (DELTASONE) 5 MG tablet Take 5 mg by mouth daily with breakfast.   Yes [provider]  tamoxifen (NOLVADEX) 20 MG tablet TAKE ONE (1) TABLET BY MOUTH ONCE DAILY 02/27/21  Yes Verlon Au, NP  tamoxifen (NOLVADEX) 20 MG tablet Take 20 mg by mouth daily. 02/27/21  Yes [provider]  ursodiol (ACTIGALL) 300 MG capsule Take 600 mg by mouth 2 (two) times daily. 04/02/21  Yes  [provider]  venlafaxine XR (EFFEXOR-XR) 37.5 MG 24 hr capsule Take 37.5 mg by mouth daily. 01/14/20  Yes [provider]  VENTOLIN HFA 108 (90 Base) MCG/ACT inhaler Inhale 2 puffs into the lungs every 4 (four) hours as needed. 01/31/21  Yes [provider]  Vitamin D, Ergocalciferol, (DRISDOL) 1.25 MG (50000 UNIT) CAPS capsule Take 50,000 Units by mouth every 14 (fourteen) days. On Sundays   Yes [provider]  acetaminophen (TYLENOL) 500 MG tablet Take 750 mg by mouth at bedtime as needed for mild pain. For jaw fracture    [provider]     Total critical care time: 52 minutes  Performed by: [place] care time was exclusive of separately billable procedures and treating other patients.   Critical care was necessary to treat or prevent imminent or life-threatening deterioration.   Critical care was time spent personally by me on the following activities: development of treatment plan with patient and/or surrogate as well as nursing, discussions with consultants, evaluation of patient's response to treatment, examination of patient, obtaining history from patient or surrogate, ordering and performing treatments and interventions, ordering and review of laboratory studies, ordering and review of radiographic studies, pulse oximetry and re-evaluation of patient's condition.   Jacky Kindle MD [facility] See Amion for pager If no response to pager, please call [phone] until 7pm After 7pm, Please call E-link [phone]

## 2021-04-20 NOTE — Plan of Care (Signed)

## 2021-04-20 NOTE — Code Documentation (Signed)
Telemetry called to alert Care RN of V Tach.  Care RN was at Isolation room at the time of call.  Care RN received second call from Unit [facility] within the minute and went to check on patient.  Patient was found non responsive.  Code Blue was initiated at 1015.  Patient transferred to ICU after being intubated.  Report given at bedside to [facility] [name].  All care transferred.

## 2021-04-20 NOTE — Progress Notes (Signed)
PROGRESS NOTE    Adriana Martin  [id number] DOB: 05/10/1946 DOA: 04/19/2021 PCP: [name], MD (Inactive)    Brief Narrative:  75 year old female with history of diastolic dysfunction, chronic kidney disease stage IIIb, paroxysmal A. fib on Eliquis, recent hospitalization for acute kidney injury and colitis who was brought to emergency room with intermittent shortness of breath.  Her diuretics were stopped before discharge. In the emergency room blood pressure is stable.  Creatinine 1.2.  BNP 558.  Respiratory panel COVID and flu negative.  Chest x-ray with cardiomegaly and bilateral small pleural effusions.  Patient was admitted to the hospital to treat for fluid overload. 7/31, morning rounds patient was seen and examined.  She was in her usual state of health.  When this provider was in the unit seeing other patients, patient had V. tach cardiac arrest.  High-quality CPR done.  See below for code documentations.   Assessment & Plan:   Principal Problem:   Acute exacerbation of CHF (congestive heart failure) (HCC) Active Problems:   Anxiety   Primary biliary cholangitis (HCC)   A-fib (HCC)   Hyperlipidemia   HTN (hypertension)   COPD (chronic obstructive pulmonary disease) (HCC)   HLD (hyperlipidemia)   Hypothyroid   Cirrhosis (HCC)   Breast cancer (HCC)   Dementia ([place])   CHF exacerbation ([place])  V. Fib/tach cardiac arrest in a patient with known A. fib, diastolic dysfunction and multiple other cardiovascular comorbidities. Received 1 round of epinephrine, subsequent 1 round of cardioversion with return of spontaneous circulation.  Patient was intubated at the bedside by critical care physician.  Started on amiodarone load and further imitation infusion. Patient transferred to intensive care unit after stabilization. She will stay on amiodarone infusion. Cardiology consulted for further recommendations. Will be transferred to ICU service until stabilization. Called  and updated patient's family, her daughter.  CKD stage IIIb: At about her baseline.  Hypokalemia: Along with Lasix use.  Replaced aggressively.  Will write IV potassium replacements.  Hypothyroidism: On replacement.  Patient with acute change in condition.  Transferring to ICU.  Care transferred to intensivist at the bedside.    DVT prophylaxis:  apixaban (ELIQUIS) tablet 5 mg   Code Status: Full code Family Communication: Daughter on the phone.  I encouraged to visit. Disposition Plan: Status is: Observation  The patient will require care spanning > 2 midnights and should be moved to inpatient because: Persistent severe electrolyte disturbances, IV treatments appropriate due to intensity of illness or inability to take PO, and Inpatient level of care appropriate due to severity of illness  Dispo: The patient is from: Home              Anticipated d/c is to:  Unknown.              Patient currently is not medically stable to d/c.   Difficult to place patient No         Consultants:  Cardiology Critical care  Procedures:  Intubation  Antimicrobials:  None   Subjective: See above events.  Initially examined her in the morning rounds and was without any complaints.  After about 15 minutes of my initial evaluation, patient had cardiac arrest, CPR done and transferred to ICU.  Objective: Vitals:   04/20/21 0303 04/20/21 0804 04/20/21 1040 04/20/21 1100  BP: (!) 147/65 140/67  (!) 150/74  Pulse: 60 60  72  Resp: '18 20  17  '$ Temp: 98.2 F (36.8 C) 98.4 F (36.9 C)  TempSrc: Oral Oral    SpO2: 96% 92% 100% 100%  Weight:      Height:        Intake/Output Summary (Last 24 hours) at 04/20/2021 1157 Last data filed at 04/20/2021 0800 Gross per 24 hour  Intake --  Output 1600 ml  Net -1600 ml   Filed Weights   04/19/21 2023  Weight: 78 kg    Examination: after arrest   General exam: Patient was unresponsive.  [place] rapid pulse after  resuscitation. Respiratory system: Poor bilateral air entry.  ET tube in place. Cardiovascular system: S1 & S2 heard, irregularly irregular.  Tachycardic.  Bilateral trace pedal edema.   Gastrointestinal system: Soft.  Nontender.  Bowel sounds present. Central nervous system: Unresponsive.    Data Reviewed: I have personally reviewed following labs and imaging studies  CBC: Recent Labs  Lab 04/19/21 2152  WBC 9.6  HGB 12.1  HCT 39.6  MCV 98.0  PLT [id number]   Basic Metabolic Panel: Recent Labs  Lab 04/19/21 2152 04/19/21 2358 04/20/21 0345  NA 136  --  137  K 3.6  --  3.0*  CL 100  --  100  CO2 25  --  27  GLUCOSE 82  --  98  BUN 23  --  22  CREATININE 1.22*  --  1.08*  CALCIUM 9.1  --  8.9  MG  --  2.1  --    GFR: Estimated Creatinine Clearance: 43.6 mL/min (A) (by C-G formula based on SCr of 1.08 mg/dL (H)). Liver Function Tests: Recent Labs  Lab 04/20/21 0345  AST 117*  ALT 159*  ALKPHOS 60  BILITOT 0.9  PROT 6.7  ALBUMIN 3.1*   No results for input(s): LIPASE, AMYLASE in the last 168 hours. No results for input(s): AMMONIA in the last 168 hours. Coagulation Profile: No results for input(s): INR, PROTIME in the last 168 hours. Cardiac Enzymes: No results for input(s): CKTOTAL, CKMB, CKMBINDEX, TROPONINI in the last 168 hours. BNP (last 3 results) No results for input(s): PROBNP in the last 8760 hours. HbA1C: No results for input(s): HGBA1C in the last 72 hours. CBG: No results for input(s): GLUCAP in the last 168 hours. Lipid Profile: No results for input(s): CHOL, HDL, LDLCALC, TRIG, CHOLHDL, LDLDIRECT in the last 72 hours. Thyroid Function Tests: No results for input(s): TSH, T4TOTAL, FREET4, T3FREE, THYROIDAB in the last 72 hours. Anemia Panel: No results for input(s): VITAMINB12, FOLATE, FERRITIN, TIBC, IRON, RETICCTPCT in the last 72 hours. Sepsis Labs: No results for input(s): PROCALCITON, LATICACIDVEN in the last 168 hours.  Recent Results  (from the past 240 hour(s))  Resp Panel by RT-PCR (Flu A&B, Covid) Nasopharyngeal Swab     Status: None   Collection Time: 04/19/21 10:23 PM   Specimen: Nasopharyngeal Swab; Nasopharyngeal(NP) swabs in vial transport medium  Result Value Ref Range Status   SARS Coronavirus 2 by RT PCR NEGATIVE NEGATIVE Final    Comment: (NOTE) SARS-CoV-2 target nucleic acids are NOT DETECTED.  The SARS-CoV-2 RNA is generally detectable in upper respiratory specimens during the acute phase of infection. The lowest concentration of SARS-CoV-2 viral copies this assay can detect is 138 copies/mL. A negative result does not preclude SARS-Cov-2 infection and should not be used as the sole basis for treatment or other patient management decisions. A negative result may occur with  improper specimen collection/handling, submission of specimen other than nasopharyngeal swab, presence of viral mutation(s) within the areas targeted by this assay, and inadequate number  of viral copies(<138 copies/mL). A negative result must be combined with clinical observations, patient history, and epidemiological information. The expected result is Negative.  Fact Sheet for Patients:  EntrepreneurPulse.com.au  Fact Sheet for Healthcare Providers:  IncredibleEmployment.be  This test is no t yet approved or cleared by the [place] FDA and  has been authorized for detection and/or diagnosis of SARS-CoV-2 by FDA under an Emergency Use Authorization (EUA). This EUA will remain  in effect (meaning this test can be used) for the duration of the COVID-19 declaration under Section 564(b)(1) of the Act, 21 U.S.C.section 360bbb-3(b)(1), unless the authorization is terminated  or revoked sooner.       Influenza A by PCR NEGATIVE NEGATIVE Final   Influenza B by PCR NEGATIVE NEGATIVE Final    Comment: (NOTE) The Xpert Xpress SARS-CoV-2/FLU/RSV plus assay is intended as an aid in the  diagnosis of influenza from Nasopharyngeal swab specimens and should not be used as a sole basis for treatment. Nasal washings and aspirates are unacceptable for Xpert Xpress SARS-CoV-2/FLU/RSV testing.  Fact Sheet for Patients: EntrepreneurPulse.com.au  Fact Sheet for Healthcare Providers: IncredibleEmployment.be  This test is not yet approved or cleared by the [place] FDA and has been authorized for detection and/or diagnosis of SARS-CoV-2 by FDA under an Emergency Use Authorization (EUA). This EUA will remain in effect (meaning this test can be used) for the duration of the COVID-19 declaration under Section 564(b)(1) of the Act, 21 U.S.C. section 360bbb-3(b)(1), unless the authorization is terminated or revoked.  Performed at [facility], [address], [place], [place] [address]          Radiology Studies: DG Chest 2 View  Result Date: 04/19/2021 CLINICAL DATA:  Shortness of breath for several days. Hypoxia. EXAM: CHEST - 2 VIEW COMPARISON:  06/08/2016 FINDINGS: Moderate cardiomegaly shows no significant change. 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 A new tiny right pleural effusion is seen. No evidence of acute pulmonary edema or other infiltrate. Calcified granuloma again seen in the right lower lung. Right shoulder prosthesis also noted. IMPRESSION: Stable cardiomegaly. New tiny right pleural effusion. Electronically Signed   By: Marlaine Hind M.D.   On: 04/19/2021 21:10        Scheduled Meds:  apixaban  5 mg Oral BID   Chlorhexidine Gluconate Cloth  6 each Topical Daily   donepezil  5 mg Oral QHS   ezetimibe  10 mg Oral Daily   furosemide  40 mg Intravenous BID   levothyroxine  50 mcg Oral Q0600   mometasone-formoterol  2 puff Inhalation BID   pantoprazole (PROTONIX) IV  40 mg Intravenous Daily   potassium chloride   40 mEq Oral TID   pravastatin  40 mg Oral QHS   predniSONE  5 mg Oral Q breakfast   sodium chloride flush  3 mL Intravenous Q12H   tamoxifen  20 mg Oral Daily   ursodiol  600 mg Oral BID   venlafaxine XR  37.5 mg Oral Daily   Continuous Infusions:  amiodarone     norepinephrine     sodium chloride     dexmedetomidine (PRECEDEX) IV infusion     norepinephrine (LEVOPHED) Adult infusion     potassium chloride 10 mEq (04/20/21 1100)     LOS: 0 days    Time spent: 35 minutes    Barb Merino, MD [facility] Pager [phone]

## 2021-04-20 NOTE — Procedures (Signed)
Intubation Procedure Note  Adriana Martin  [id number]  1946-04-20  Date:04/20/21  Time:10:31 AM   Provider Performing:Christin Moline    Procedure: Intubation (31500)  Indication(s) Respiratory Failure  Consent Risks of the procedure as well as the alternatives and risks of each were explained to the patient and/or caregiver.  Consent for the procedure was obtained and is signed in the bedside chart   Anesthesia None   Time Out Verified patient identification, verified procedure, site/side was marked, verified correct patient position, special equipment/implants available, medications/allergies/relevant history reviewed, required imaging and test results available.   Sterile Technique Usual hand hygeine, masks, and gloves were used   Procedure Description Patient positioned in bed supine.  Sedation given as noted above.  Patient was intubated with endotracheal tube using  DL MAC 4 .  View was Grade 1 full glottis .  Number of attempts was 1.  Colorimetric CO2 detector was consistent with tracheal placement.   Complications/Tolerance None; patient tolerated the procedure well. Chest X-ray is ordered to verify placement.   EBL Minimal   Specimen(s) None

## 2021-04-20 NOTE — Procedures (Signed)
Cardiopulmonary Resuscitation Note  Adriana Martin  [id number]  [birth year]  Date:04/20/21  Time:10:30 AM   Provider Performing:Lotta Frankenfield   Procedure: Cardiopulmonary Resuscitation [phone])  Indication(s) Loss of Pulse  Consent N/A  Anesthesia N/A   Time Out N/A   Sterile Technique Hand hygiene, gloves   Procedure Description Called to patient's room for CODE BLUE. Initial rhythm was Vfib/Vtach. Patient received high quality chest compressions for 2 minutes with defibrillation or cardioversion when appropriate. Epinephrine was administered every 3 minutes as directed by time Therapist, nutritional. Additional pharmacologic interventions included amiodarone. Additional procedural interventions include intubation.  Return of spontaneous circulation was achieved.  Family to be notified.   Complications/Tolerance N/A   EBL N/A   Specimen(s) N/A  Estimated time to ROSC: 2 minutes

## 2021-04-20 NOTE — Consult Note (Signed)
Cardiology Consultation Note    Patient ID: Adriana Martin, MRN: [id number], DOB/AGE: 03/07/1946 75 y.o. Admit date: 04/19/2021   Date of Consult: 04/20/2021 Primary Dr. Nehemiah Massed   Chief Complaint: s/p cardiac arrest Reason for Consultation: s/p t arrest Requesting MD: Dr. Tacy Learn  HPI: Adriana Martin is a 75 y.o. female with history of hypertension, hyperlipidemia, hypothyroidism, history of heart block status post permanent pacemaker, COPD, CKD 3B, atrial fibrillation and heart failure felt to be HFpEF was admitted for the shortness of breath.  She also complained of lower extremity edema.  Home health nurse noted that her pulse ox was 87.  She had a recent admission for colitis and several medications were held due to AKI.  These medications included furosemide, lisinopril and diltiazem..  Echocardiogram done April 05, 2021 showed preserved LV function with an EF of 60 to 65% with left and right atrial enlargement.  Her bioprosthetic aortic valve mean gradient had increased slightly.  However grossly there was no change in the valve prosthesis.  She was admitted and placed on diuresis.  EKG on admission showed probable atrial flutter with ventricular paced rhythm.  Troponin on admission was 47 currently 82 with more pending.  Potassium on admission was 3.0.  Currently 3.4.  Magnesium is 1.9.  Patient developed spontaneous ventricular tachycardia with cyanosis and no pulse.  CPR and ACLS protocol was begun.  ROSC was achieved after 2 minutes and receiving an amiodarone bolus x1 and 1 electrical shock.  Patient was intubated and transferred to the ICU for management.  She is currently relatively hypotensive pacing at 60.  Patient is intubated and sedated and unable to give history.  Hypotensive.  Levophed has been started.  Past Medical History:  Diagnosis Date   Anemia    Anxiety    Atrial fibrillation ([place])    B12 deficiency    Breast cancer ([place])    RT Lumpectomy c radiation 2012    Breast cancer ([facility])    CHF (congestive heart failure) ([facility])    Chronic diastolic CHF (congestive heart failure) (H[facility])    Chronic kidney disease    Cirrhosis (H[facility])    CKD (chronic kidney disease), stage III (HCC)    COPD (chronic obstructive pulmonary disease) ([place])    Depression    Family history of breast cancer    Family history of esophageal cancer    Family history of multiple myeloma    GERD (gastroesophageal reflux disease)    Headache    Heart murmur    HLD (hyperlipidemia)    HTN (hypertension)    Hyperlipemia    Hyperlipidemia    Hypertension    Hypothyroid    Hypothyroidism    Iron (Fe) deficiency anemia    Pacemaker    Personal history of radiation therapy    Presence of permanent cardiac pacemaker    Shortness of breath dyspnea    Thrombocytopenia (H[facility])    Vitamin D deficiency       Surgical History:  Past Surgical History:  Procedure Laterality Date   ABDOMINAL HYSTERECTOMY     Partial   ABLATION     APPENDECTOMY     BICEPT TENODESIS Right 01/07/2018   Procedure: BICEPS TENODESIS;  Surgeon: Leim Fabry, MD;  Location: [facility];  Service: Orthopedics;  Laterality: Right;   BREAST BIOPSY Right 2012   BREAST BIOPSY Right 07/21/2019   venus clip, [place] Bx,positive   BREAST LUMPECTOMY Right 2012   positive/rad   BREAST SURGERY  CARDIAC SURGERY     CARDIAC VALVE REPLACEMENT     COLONOSCOPY  2012   ELECTROPHYSIOLOGIC STUDY N/A 01/23/2015   Procedure: CARDIOVERSION;  Surgeon: Corey Skains, MD;  Location: [facility];  Service: Cardiovascular;  Laterality: N/A;   ELECTROPHYSIOLOGIC STUDY N/A 05/27/2016   Procedure: CARDIOVERSION;  Surgeon: Corey Skains, MD;  Location: [facility];  Service: Cardiovascular;  Laterality: N/A;   ESOPHAGOGASTRODUODENOSCOPY  2012   EYE SURGERY     IR CT HEAD LTD  03/10/2020   IR PERCUTANEOUS ART THROMBECTOMY/INFUSION INTRACRANIAL INC DIAG ANGIO  03/10/2020   IR US GUIDE VASC ACCESS RIGHT  03/10/2020   MASTECTOMY Right    Mastectomy  Right    MASTECTOMY WITH AXILLARY LYMPH NODE DISSECTION Right 08/09/2019   Procedure: MASTECTOMY WITH AXILLARY LYMPH NODE DISSECTION;  Surgeon: Robert Bellow, MD;  Location: [facility];  Service: General;  Laterality: Right;   MITRAL VALVE REPLACEMENT     mvp     PACEMAKER PLACEMENT N/A    RADIOLOGY WITH ANESTHESIA N/A 03/10/2020   Procedure: IR WITH ANESTHESIA;  Surgeon: Radiologist, Medication, MD;  Location: [facility];  Service: Radiology;  Laterality: N/A;   REVERSE SHOULDER ARTHROPLASTY Right 01/07/2018   Procedure: REVERSE SHOULDER ARTHROPLASTY;  Surgeon: Leim Fabry, MD;  Location: [facility];  Service: Orthopedics;  Laterality: Right;   VSD REPAIR     VSD REPAIR N/A      Home Meds: Prior to Admission medications   Medication Sig Start Date End Date Taking? Authorizing Provider  ADVAIR DISKUS 100-50 MCG/DOSE AEPB Inhale 1 puff into the lungs 2 (two) times daily. 04/15/20  Yes [provider]  B Complex-C (B-COMPLEX WITH VITAMIN C) tablet Take 1 tablet by mouth daily.   Yes [provider]  donepezil (ARICEPT) 5 MG tablet Take 5 mg by mouth at bedtime. 03/21/21  Yes [provider]  ELIQUIS 5 MG TABS tablet Take 5 mg by mouth 2 (two) times daily. 03/05/21  Yes [provider]  ezetimibe (ZETIA) 10 MG tablet Take 10 mg by mouth daily. 02/05/21  Yes [provider]  fluticasone-salmeterol (ADVAIR) 100-50 MCG/ACT AEPB Inhale 1 puff into the lungs 2 (two) times daily.   Yes [provider]  levothyroxine (SYNTHROID) 50 MCG tablet Take 50 mcg by mouth daily before breakfast. 02/05/21  Yes [provider]  Multiple Vitamin (MULTIVITAMIN) tablet Take 1 tablet by mouth daily.   Yes [provider]  pravastatin (PRAVACHOL) 40 MG tablet Take 40 mg by mouth at bedtime. 04/02/21  Yes [provider]  predniSONE (DELTASONE) 5 MG tablet Take 5 mg by mouth daily with breakfast.   Yes [provider]  tamoxifen (NOLVADEX)  20 MG tablet TAKE ONE (1) TABLET BY MOUTH ONCE DAILY 02/27/21  Yes Verlon Au, NP  tamoxifen (NOLVADEX) 20 MG tablet Take 20 mg by mouth daily. 02/27/21  Yes [provider]  ursodiol (ACTIGALL) 300 MG capsule Take 600 mg by mouth 2 (two) times daily. 04/02/21  Yes [provider]  venlafaxine XR (EFFEXOR-XR) 37.5 MG 24 hr capsule Take 37.5 mg by mouth daily. 01/14/20  Yes [provider]  VENTOLIN HFA 108 (90 Base) MCG/ACT inhaler Inhale 2 puffs into the lungs every 4 (four) hours as needed. 01/31/21  Yes [provider]  Vitamin D, Ergocalciferol, (DRISDOL) 1.25 MG (50000 UNIT) CAPS capsule Take 50,000 Units by mouth every 14 (fourteen) days. On Sundays   Yes [provider]  acetaminophen (TYLENOL) 500  MG tablet Take 750 mg by mouth at bedtime as needed for mild pain. For jaw fracture    [provider]    Inpatient Medications:   apixaban  5 mg Oral BID   Chlorhexidine Gluconate Cloth  6 each Topical Daily   donepezil  5 mg Oral QHS   ezetimibe  10 mg Oral Daily   furosemide  40 mg Intravenous BID   levothyroxine  50 mcg Oral Q0600   mometasone-formoterol  2 puff Inhalation BID   pantoprazole (PROTONIX) IV  40 mg Intravenous Daily   potassium chloride  40 mEq Oral TID   pravastatin  40 mg Oral QHS   predniSONE  5 mg Oral Q breakfast   sodium chloride flush  3 mL Intravenous Q12H   tamoxifen  20 mg Oral Daily   ursodiol  600 mg Oral BID   venlafaxine XR  37.5 mg Oral Daily    sodium chloride 250 mL (04/20/21 1227)   amiodarone 60 mg/hr (04/20/21 1222)   amiodarone     dexmedetomidine (PRECEDEX) IV infusion     magnesium sulfate bolus IVPB 2 g (04/20/21 1338)   norepinephrine (LEVOPHED) Adult infusion     potassium chloride 10 mEq (04/20/21 1328)   propofol (DIPRIVAN) infusion Stopped (04/20/21 1357)    Allergies:  Allergies  Allergen Reactions   Atorvastatin Shortness Of Breath   Calcium Shortness Of Breath   Calcium  Carbonate     Other reaction(s): Unknown   Fluoxetine     Other reaction(s): Unknown   Hydrochlorothiazide     Other reaction(s): Unknown    Social History   Socioeconomic History   Marital status: Divorced    Spouse name: Not on file   Number of children: Not on file   Years of education: Not on file   Highest education level: Not on file  Occupational History   Not on file  Tobacco Use   Smoking status: Never   Smokeless tobacco: Never  Vaping Use   Vaping Use: Never used  Substance and Sexual Activity   Alcohol use: Not Currently    Comment: socially   Drug use: Never   Sexual activity: Not on file  Other Topics Concern   Not on file  Social History Narrative   ** Merged History Encounter **       Social Determinants of Health   Financial Resource Strain: Not on file  Food Insecurity: Not on file  Transportation Needs: Not on file  Physical Activity: Not on file  Stress: Not on file  Social Connections: Not on file  Intimate Partner Violence: Not on file     Family History  Problem Relation Age of Onset   Multiple myeloma Father    Esophageal cancer Brother    Breast cancer Mother        never treated, dx. >50   Esophageal cancer Brother    Breast cancer Daughter 72   Cancer Daughter 45       rare blood cancer     Review of Systems: A 12-system review of systems was performed and is negative except as noted in the HPI.  Labs: No results for input(s): CKTOTAL, CKMB, TROPONINI in the last 72 hours. Lab Results  Component Value Date   WBC 9.6 04/19/2021   HGB 12.1 04/19/2021   HCT 39.6 04/19/2021   MCV 98.0 04/19/2021   PLT 202 04/19/2021    Recent Labs  Lab 04/20/21 1114  NA 136  K 3.4*  CL 96*  CO2 24  BUN 21  CREATININE 1.33*  CALCIUM 8.6*  PROT 6.5  BILITOT 0.9  ALKPHOS 61  ALT 191*  AST 181*  GLUCOSE 181*   Lab Results  Component Value Date   CHOL 202 (H) 03/11/2020   HDL 54 03/11/2020   LDLCALC 129 (H) 03/11/2020   TRIG  97 03/11/2020   No results found for: DDIMER  Radiology/Studies:  DG Chest 2 View  Result Date: 04/19/2021 CLINICAL DATA:  Shortness of breath for several days. Hypoxia. EXAM: CHEST - 2 VIEW COMPARISON:  06/08/2016 FINDINGS: Moderate cardiomegaly shows no significant change. 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 A new tiny right pleural effusion is seen. No evidence of acute pulmonary edema or other infiltrate. Calcified granuloma again seen in the right lower lung. Right shoulder prosthesis also noted. IMPRESSION: Stable cardiomegaly. New tiny right pleural effusion. Electronically Signed   By: Marlaine Hind M.D.   On: 04/19/2021 21:10   ECHOCARDIOGRAM COMPLETE  Result Date: 04/05/2021    ECHOCARDIOGRAM REPORT   Patient Name:   DOLORES EWING Date of Exam: 04/05/2021 Medical Rec #:  [id number]            Height:       62.0 in Accession #:    [id number]           Weight:       164.0 lb Date of Birth:  06/02/1946            BSA:          1.757 m Patient Age:    75 years             BP:           109/51 mmHg Patient Gender: F                    HR:           67 bpm. Exam Location:  [facility] Procedure: 2D Echo Indications:     Atrial Flutter I48.92  History:         Patient has no prior history of Echocardiogram examinations.  Sonographer:     Kathlen Brunswick RDCS Referring Phys:  [id number] [place] Diagnosing Phys: Skeet Latch MD IMPRESSIONS  1. Left ventricular ejection fraction, by estimation, is 60 to 65%. The left ventricle has normal function. The left ventricle has no regional wall motion abnormalities. Left ventricular diastolic parameters are indeterminate.  2. Right ventricular systolic function is normal. The right ventricular size is normal. There is normal pulmonary artery systolic pressure.  3. Left atrial size was severely dilated.  4. Right atrial size was severely dilated.  5. Compared  with the echo 02/2020, mean bioprosthetic mitral valve gradient has increased from 10 mmHg to 16 mmHg. Visually the valve appears unchanged from 02/2020. Gradients may be elevated in the setting of high flow and sepsis. No increased thickness or vegetations concerning for endocarditis. The mitral valve has been repaired/replaced. No evidence of mitral valve regurgitation. Severe mitral stenosis. Echo findings are consistent with normal structure and function of the mitral valve prosthesis.  6. The aortic valve is tricuspid. Aortic valve regurgitation is trivial. No aortic stenosis is present.  7. The inferior vena cava is normal in size with greater than 50% respiratory variability, suggesting right atrial pressure of 3 mmHg. FINDINGS  Left Ventricle: Left ventricular ejection  fraction, by estimation, is 60 to 65%. The left ventricle has normal function. The left ventricle has no regional wall motion abnormalities. The left ventricular internal cavity size was normal in size. There is  no left ventricular hypertrophy. Left ventricular diastolic parameters are indeterminate. Right Ventricle: The right ventricular size is normal. No increase in right ventricular wall thickness. Right ventricular systolic function is normal. There is normal pulmonary artery systolic pressure. The tricuspid regurgitant velocity is 2.35 m/s, and  with an assumed right atrial pressure of 3 mmHg, the estimated right ventricular systolic pressure is 93.7 mmHg. Left Atrium: Left atrial size was severely dilated. Right Atrium: Right atrial size was severely dilated. Pericardium: There is no evidence of pericardial effusion. Mitral Valve: Compared with the echo 02/2020, mean bioprosthetic mitral valve gradient has increased from 10 mmHg to 16 mmHg. Visually the valve appears unchanged from 02/2020. Gradients may be elevated in the setting of high flow and sepsis. No increased thickness or vegetations concerning for endocarditis. The mitral valve  has been repaired/replaced. No evidence of mitral valve regurgitation. Echo findings are consistent with normal structure and function of the mitral valve prosthesis. Severe mitral valve stenosis. MV peak gradient, 38.4 mmHg. The mean mitral valve gradient is 16.0 mmHg. Tricuspid Valve: The tricuspid valve is normal in structure. Tricuspid valve regurgitation is trivial. No evidence of tricuspid stenosis. Aortic Valve: The aortic valve is tricuspid. Aortic valve regurgitation is trivial. No aortic stenosis is present. Aortic valve mean gradient measures 6.0 mmHg. Aortic valve peak gradient measures 13.7 mmHg. Aortic valve area, by VTI measures 1.44 cm. Pulmonic Valve: The pulmonic valve was normal in structure. Pulmonic valve regurgitation is not visualized. No evidence of pulmonic stenosis. Aorta: The aortic root is normal in size and structure. Venous: The inferior vena cava is normal in size with greater than 50% respiratory variability, suggesting right atrial pressure of 3 mmHg. IAS/Shunts: No atrial level shunt detected by color flow Doppler.  LEFT VENTRICLE PLAX 2D LVIDd:         4.30 cm  Diastology LVIDs:         3.00 cm  LV e' medial:    4.35 cm/s LV PW:         1.40 cm  LV E/e' medial:  49.4 LV IVS:        1.00 cm  LV e' lateral:   4.68 cm/s LVOT diam:     1.80 cm  LV E/e' lateral: 45.9 LV SV:         43 LV SV Index:   25 LVOT Area:     2.54 cm  LEFT ATRIUM           Index       RIGHT ATRIUM           Index LA diam:      4.80 cm 2.73 cm/m  RA Area:     23.50 cm LA Vol (A2C): 32.1 ml 18.27 ml/m RA Volume:   77.90 ml  44.33 ml/m LA Vol (A4C): 71.0 ml 40.41 ml/m  AORTIC VALVE                    PULMONIC VALVE AV Area (Vmax):    1.22 cm     PV Vmax:       0.91 m/s AV Area (Vmean):   1.28 cm     PV Peak grad:  3.3 mmHg AV Area (VTI):     1.44 cm AV Vmax:  185.00 cm/s AV Vmean:          114.000 cm/s AV VTI:            0.300 m AV Peak Grad:      13.7 mmHg AV Mean Grad:      6.0 mmHg LVOT Vmax:          88.70 cm/s LVOT Vmean:        57.200 cm/s LVOT VTI:          0.170 m LVOT/AV VTI ratio: 0.57  AORTA Ao Root diam: 2.90 cm Ao Asc diam:  3.20 cm MITRAL VALVE                TRICUSPID VALVE MV Area (PHT): 0.96 cm     TV Peak grad:   25.8 mmHg MV Area VTI:   0.32 cm     TV Vmax:        2.54 m/s MV Peak grad:  38.4 mmHg    TR Peak grad:   22.1 mmHg MV Mean grad:  16.0 mmHg    TR Vmax:        235.00 cm/s MV Vmax:       3.10 m/s MV Vmean:      184.0 cm/s   SHUNTS MV Decel Time: 789 msec     Systemic VTI:  0.17 m MV E velocity: 215.00 cm/s  Systemic Diam: 1.80 cm Skeet Latch MD Electronically signed by Skeet Latch MD Signature Date/Time: 04/05/2021/1:33:45 PM    Final    CT Angio Abd/Pel w/ and/or w/o  Result Date: 04/04/2021 CLINICAL DATA:  Rectal bleeding, Dementia, recent fall EXAM: CTA ABDOMEN AND PELVIS WITHOUT AND WITH CONTRAST TECHNIQUE: Multidetector CT imaging of the abdomen and pelvis was performed using the standard protocol during bolus administration of intravenous contrast. Multiplanar reconstructed images and MIPs were obtained and reviewed to evaluate the vascular anatomy. CONTRAST:  25m OMNIPAQUE IOHEXOL 350 MG/ML SOLN COMPARISON:  03/20/2013 FINDINGS: VASCULAR Aorta: Moderate calcified atheromatous plaque. No aneurysm, dissection, or stenosis. Celiac: Calcified ostial plaque without stenosis, patent distally within remarkable branch anatomy. SMA: Ostial plaque extending over length of approximately 1.6 cm resulting in moderate stenosis. Vessels patent distally with classic branch anatomy. Renals: Single left, with proximal branching. No stenosis. Duplicated right, superior dominant, both patent. IMA: Patent without evidence of aneurysm, dissection, vasculitis or significant stenosis. Inflow: Mild plaque in bilateral common iliac arteries without stenosis. No dissection or aneurysm. Proximal Outflow: Bilateral common femoral and visualized portions of the superficial and profunda  femoral arteries are patent without evidence of aneurysm, dissection, vasculitis or significant stenosis. Veins: Patent hepatic veins, portal vein, SMV, splenic vein, bilateral renal veins. Scattered mural calcifications in right common femoral and common iliac veins suggesting sequela of previous central venous catheter or DVT. IVC is nondistended, unremarkable. Review of the MIP images confirms the above findings. NON-VASCULAR Lower chest: Cardiomegaly with biatrial enlargement. Transvenous pacing leads partially visualized. Mitral valve replacement. No pleural or pericardial effusion. Hepatobiliary: No focal liver abnormality is seen. No gallstones, gallbladder wall thickening, or biliary dilatation. Pancreas: Unremarkable. No pancreatic ductal dilatation or surrounding inflammatory changes. Spleen: Normal in size without focal abnormality. Adrenals/Urinary Tract: Adrenal glands unremarkable. Symmetric renal enhancement. No hydronephrosis or urolithiasis. Urinary bladder incompletely distended. Stomach/Bowel: Stomach and small bowel are nondistended. Appendix not identified. No pericecal inflammatory change. The colon is nondilated. Suspect mild circumferential wall thickening in the splenic flexure with mild adjacent inflammatory change. Scattered sigmoid diverticula without adjacent inflammatory change. No  evidence of active extravasation. Lymphatic: No abdominal or pelvic adenopathy. Reproductive: Status post hysterectomy. No adnexal masses. Other: Bilateral pelvic phleboliths.  No ascites.  No free air. Musculoskeletal: Chronic T8 and L1 vertebral body compression deformities stable since 07/27/2019. Stable grade 1 anterolisthesis L4-5 without pars defect. No acute fracture or aggressive bone lesion. IMPRESSION: 1. No evidence of active GI bleeding. 2. Wall thickening in the splenic flexure of the colon suggesting colitis, nonspecific. Consider GI consultation. 3. Sigmoid diverticulosis Electronically Signed    By: Lucrezia Europe M.D.   On: 04/04/2021 12:02    Wt Readings from Last 3 Encounters:  04/19/21 78 kg  04/07/21 77.3 kg  11/21/20 79 kg    EKG: Probable atrial flutter with ventricular paced rhythm  Physical Exam: Acutely ill intubated female Blood pressure 93/69, pulse 61, temperature (!) 96.9 F (36.1 C), resp. rate 20, height 5' 2" (1.575 m), weight 78 kg, SpO2 98 %. Body mass index is 31.46 kg/m. General: Well developed, well nourished, in no acute distress. Head: Normocephalic, atraumatic, sclera non-icteric, no xanthomas, nares are without discharge.  Neck: Negative for carotid bruits. JVD not elevated. Lungs: Clear bilaterally to auscultation without wheezes, rales, or rhonchi. Breathing is unlabored. Heart: RRR with S1 S2.  No audible murmurs today in ICU Abdomen: Soft, non-tender, non-distended with normoactive bowel sounds. No hepatomegaly. No rebound/guarding. No obvious abdominal masses. Msk:  Strength and tone appear normal for age. Extremities: No clubbing or cyanosis. No edema.  Distal pedal pulses are 2+ and equal bilaterally. Neuro: Intubated and sedated     Assessment and Plan  75 year old female with HFpEF admitted with progressive shortness of breath and edema.  Is of note she was taken off of her Cardizem, lisinopril and diuretics after recent admission.  She had not resumed these.  She presented with shortness of breath felt to be volume overloaded.  Today developed what appeared to be ventricular tachycardia.  ROSC was returned with 1 shock and a bolus of amiodarone.  Currently is intubated ventricular pacing at 60.  She is hypotensive.    1.  Ventricular tachycardia etiology unclear.  Patient was mildly hypokalemic on presentation.  EF is normal.  We will continue with amiodarone drip and follow.  Troponin is elevated post VT arrest and defibrillation.  Unclear whether ischemia is playing a role but the elevated troponin is not an expected postarrest.  We will  continue to follow and depending on recovery will consider further ischemic work-up if needed.  2.  Atrial fibrillation-currently ventricular paced.  We will continue with amiodarone and follow.  We will continue with Eliquis and follow renal function.   3.  Respiratory failure-status postarrest.  Wean vent as tolerated.  Has history of COPD.  Will follow.   4.  Hypotension-status post sedation and intubation.  Will follow pressure and use pressors as needed.  We will currently use Levophed until pressure improves. Signed, Teodoro Spray MD 04/20/2021, 2:02 PM Pager: [phone]

## 2021-04-21 DIAGNOSIS — I5033 Acute on chronic diastolic (congestive) heart failure: Secondary | ICD-10-CM | POA: Diagnosis not present

## 2021-04-21 LAB — CBC
HCT: 31.3 % — ABNORMAL LOW (ref 36.0–46.0)
Hemoglobin: 10.1 g/dL — ABNORMAL LOW (ref 12.0–15.0)
MCH: 30.2 pg (ref 26.0–34.0)
MCHC: 32.3 g/dL (ref 30.0–36.0)
MCV: 93.7 fL (ref 80.0–100.0)
Platelets: 191 10*3/uL (ref 150–400)
RBC: 3.34 MIL/uL — ABNORMAL LOW (ref 3.87–5.11)
RDW: 14.6 % (ref 11.5–15.5)
WBC: 13.9 10*3/uL — ABNORMAL HIGH (ref 4.0–10.5)
nRBC: 0 % (ref 0.0–0.2)

## 2021-04-21 LAB — MAGNESIUM: Magnesium: 2.2 mg/dL (ref 1.7–2.4)

## 2021-04-21 LAB — BASIC METABOLIC PANEL
Anion gap: 12 (ref 5–15)
BUN: 28 mg/dL — ABNORMAL HIGH (ref 8–23)
CO2: 28 mmol/L (ref 22–32)
Calcium: 8.9 mg/dL (ref 8.9–10.3)
Chloride: 98 mmol/L (ref 98–111)
Creatinine, Ser: 1.42 mg/dL — ABNORMAL HIGH (ref 0.44–1.00)
GFR, Estimated: 39 mL/min — ABNORMAL LOW (ref >60)
Glucose, Bld: 115 mg/dL — ABNORMAL HIGH (ref 70–99)
Potassium: 4.6 mmol/L (ref 3.5–5.1)
Sodium: 138 mmol/L (ref 135–145)

## 2021-04-21 LAB — TROPONIN I (HIGH SENSITIVITY)
Troponin I (High Sensitivity): 199 ng/L (ref <18)
Troponin I (High Sensitivity): 162 ng/L (ref <18)

## 2021-04-21 LAB — PHOSPHORUS: Phosphorus: 3.8 mg/dL (ref 2.5–4.6)

## 2021-04-21 LAB — LACTIC ACID, PLASMA: Lactic Acid, Venous: 1.5 mmol/L (ref 0.5–1.9)

## 2021-04-21 LAB — TRIGLYCERIDES: Triglycerides: 81 mg/dL (ref <150)

## 2021-04-21 MED ORDER — FENTANYL CITRATE (PF) 100 MCG/2ML IJ SOLN
50.0000 ug | INTRAMUSCULAR | Status: DC | PRN
  Administered 2021-04-21 – 2021-04-22 (×3): 50 ug via INTRAVENOUS
  Filled 2021-04-21 (×3): qty 2

## 2021-04-21 MED ORDER — PANTOPRAZOLE SODIUM 40 MG PO TBEC
40.0000 mg | DELAYED_RELEASE_TABLET | Freq: Every day | ORAL | Status: DC
  Administered 2021-04-22: 40 mg via ORAL
  Filled 2021-04-21: qty 1

## 2021-04-21 MED ORDER — AMIODARONE HCL 200 MG PO TABS
400.0000 mg | ORAL_TABLET | Freq: Two times a day (BID) | ORAL | Status: DC
  Administered 2021-04-21 – 2021-04-24 (×8): 400 mg via ORAL
  Filled 2021-04-21 (×8): qty 2

## 2021-04-21 MED ORDER — POTASSIUM CHLORIDE CRYS ER 20 MEQ PO TBCR
40.0000 meq | EXTENDED_RELEASE_TABLET | Freq: Every day | ORAL | Status: AC
  Administered 2021-04-21 – 2021-04-24 (×4): 40 meq via ORAL
  Filled 2021-04-21 (×4): qty 2

## 2021-04-21 NOTE — Progress Notes (Signed)
[facility] night shift PCU coverage note.  The nursing staff requested clarification on fentanyl order.  The order was changed to fentanyl 50 mcg every 2 hours as needed from 100 mcg every 4 hours as needed.  Tennis Must, MD.

## 2021-04-21 NOTE — Progress Notes (Signed)
PROGRESS NOTE    Adriana Martin  [id number] DOB: 1946-04-22 DOA: 04/19/2021 PCP: [name], MD (Inactive)    Brief Narrative:  75 year old female with history of diastolic dysfunction, chronic kidney disease stage IIIb, paroxysmal A. fib on Eliquis, recent hospitalization for acute kidney injury and colitis who was brought to emergency room with intermittent shortness of breath.  Her diuretics were stopped before discharge. In the emergency room blood pressure is stable.  Creatinine 1.2.  BNP 558.  Respiratory panel COVID and flu negative.  Chest x-ray with cardiomegaly and bilateral small pleural effusions.  Patient was admitted to the hospital to treat for fluid overload.  7/31, patient had V. tach cardiac arrest.  High-quality CPR done.  Transferred to ICU, intubated and extubated same day. 8/1, adequately improved on amiodarone drip.  On room air.   Assessment & Plan:   Principal Problem:   Acute exacerbation of CHF (congestive heart failure) (HCC) Active Problems:   Anxiety   Primary biliary cholangitis (HCC)   A-fib (HCC)   Hyperlipidemia   HTN (hypertension)   COPD (chronic obstructive pulmonary disease) (HCC)   HLD (hyperlipidemia)   Hypothyroid   Cirrhosis (HCC)   Breast cancer (HCC)   Dementia ([place])   CHF exacerbation ([place])  V. Fib/tach cardiac arrest in a patient with known A. fib, diastolic dysfunction and multiple other cardiovascular comorbidities: Patient has a pacemaker in place that is functioning well. V. tach cardiac arrest treated with amiodarone infusion, changed to oral amiodarone loading today.  No more events. Followed by cardiology. Suspected to be exacerbated by hypokalemia. Aggressive replacement of electrolytes. Patient on Eliquis for anticoagulation that is continued.  CKD stage IIIb: At about her baseline.  Some worsening renal functions with cardiac arrest and that is already stabilizing.  Hypokalemia: Along with Lasix use.   Replaced and normalized.  Continue to replace further with use of Lasix.  Hypothyroidism: On replacement.  Permanent A. fib with history of complete heart block status post pacemaker: Functioning well.  Therapeutic on Eliquis.  Acute on chronic diastolic congestive heart failure: With recent discontinuation of diuretic therapy.  Patient aggressively diuresed with IV Lasix today.  Already clinically improving.  Weaned off to room air.  Continue IV Lasix today, will add scheduled potassium supplements.  Chronic prednisone use: Indication unknown.  Likely for primary biliary cholangitis.  Continue.  Patient is adequately stabilized and able to transfer to cardiac telemetry bed.  Start working with PT OT to determine next level of care.   DVT prophylaxis:  apixaban (ELIQUIS) tablet 5 mg   Code Status: Full code Family Communication: Patient's daughter at the bedside. Disposition Plan: Status is: Inpatient.   Dispo: The patient is from: Home              Anticipated d/c is to:  Unknown.              Patient currently is not medically stable to d/c.   Difficult to place patient No         Consultants:  Cardiology Critical care  Procedures:  Intubation and extubation.  Antimicrobials:  None   Subjective: Patient seen and examined in the morning rounds.  Complains of some chest discomfort and pain on deep breathing otherwise denies any other complaints.  Poor historian. Patient examined later on with patient's daughter arrived to the unit.  Patient was pleasant and eating lunch.  She is not in any distress.  She is on room air.  Objective: Vitals:  04/20/21 2300 04/21/21 0000 04/21/21 0700 04/21/21 1122  BP: 137/61 130/74 137/74 114/81  Pulse: 60 60 (!) 59 61  Resp: '16 16 18 '$ (!) 21  Temp:  98.1 F (36.7 C) (!) 97.5 F (36.4 C) 97.8 F (36.6 C)  TempSrc:  Oral    SpO2: 100% 100% 100% 95%  Weight:      Height:        Intake/Output Summary (Last 24 hours) at  04/21/2021 1414 Last data filed at 04/21/2021 1100 Gross per 24 hour  Intake 954.5 ml  Output 1710 ml  Net -755.5 ml   Filed Weights   04/19/21 2023  Weight: 78 kg    Examination:  General: Looks fairly comfortable.  Chronically sick looking frail and debilitated.  On room air.  Not in any distress. Cardiovascular: S1-S2 normal.  Irregularly irregular but rate controlled.  Pacemaker in place left precordium. Respiratory: Bilateral clear.  No added sounds.  Some palpable tenderness along the anterior chest wall. Gastrointestinal: Soft and nontender.  Bowel sounds present. Ext: No cyanosis.  Trace bilateral pedal edema present. Neuro: Alert oriented x1-2.  Pleasant.  Forgetful.  Moves all extremities equally.      Data Reviewed: I have personally reviewed following labs and imaging studies  CBC: Recent Labs  Lab 04/19/21 2152 04/21/21 0546  WBC 9.6 13.9*  HGB 12.1 10.1*  HCT 39.6 31.3*  MCV 98.0 93.7  PLT 202 [id number]   Basic Metabolic Panel: Recent Labs  Lab 04/19/21 2152 04/19/21 2358 04/20/21 0345 04/20/21 1114 04/20/21 1848 04/21/21 0546  NA 136  --  137 136  --  138  K 3.6  --  3.0* 3.4* 5.1 4.6  CL 100  --  100 96*  --  98  CO2 25  --  27 24  --  28  GLUCOSE 82  --  98 181*  --  115*  BUN 23  --  22 21  --  28*  CREATININE 1.22*  --  1.08* 1.33*  --  1.42*  CALCIUM 9.1  --  8.9 8.6*  --  8.9  MG  --  2.1  --  1.9 2.2 2.2  PHOS  --   --   --  4.1  --  3.8   GFR: Estimated Creatinine Clearance: 33.1 mL/min (A) (by C-G formula based on SCr of 1.42 mg/dL (H)). Liver Function Tests: Recent Labs  Lab 04/20/21 0345 04/20/21 1114  AST 117* 181*  ALT 159* 191*  ALKPHOS 60 61  BILITOT 0.9 0.9  PROT 6.7 6.5  ALBUMIN 3.1* 2.9*   No results for input(s): LIPASE, AMYLASE in the last 168 hours. No results for input(s): AMMONIA in the last 168 hours. Coagulation Profile: No results for input(s): INR, PROTIME in the last 168 hours. Cardiac Enzymes: No results for  input(s): CKTOTAL, CKMB, CKMBINDEX, TROPONINI in the last 168 hours. BNP (last 3 results) No results for input(s): PROBNP in the last 8760 hours. HbA1C: No results for input(s): HGBA1C in the last 72 hours. CBG: Recent Labs  Lab 04/20/21 1034  GLUCAP 180*   Lipid Profile: Recent Labs    04/21/21 0546  TRIG 81   Thyroid Function Tests: No results for input(s): TSH, T4TOTAL, FREET4, T3FREE, THYROIDAB in the last 72 hours. Anemia Panel: No results for input(s): VITAMINB12, FOLATE, FERRITIN, TIBC, IRON, RETICCTPCT in the last 72 hours. Sepsis Labs: Recent Labs  Lab 04/20/21 1114 04/21/21 0546  LATICACIDVEN 5.5* 1.5    Recent Results (from  the past 240 hour(s))  Resp Panel by RT-PCR (Flu A&B, Covid) Nasopharyngeal Swab     Status: None   Collection Time: 04/19/21 10:23 PM   Specimen: Nasopharyngeal Swab; Nasopharyngeal(NP) swabs in vial transport medium  Result Value Ref Range Status   SARS Coronavirus 2 by RT PCR NEGATIVE NEGATIVE Final    Comment: (NOTE) SARS-CoV-2 target nucleic acids are NOT DETECTED.  The SARS-CoV-2 RNA is generally detectable in upper respiratory specimens during the acute phase of infection. The lowest concentration of SARS-CoV-2 viral copies this assay can detect is 138 copies/mL. A negative result does not preclude SARS-Cov-2 infection and should not be used as the sole basis for treatment or other patient management decisions. A negative result may occur with  improper specimen collection/handling, submission of specimen other than nasopharyngeal swab, presence of viral mutation(s) within the areas targeted by this assay, and inadequate number of viral copies(<138 copies/mL). A negative result must be combined with clinical observations, patient history, and epidemiological information. The expected result is Negative.  Fact Sheet for Patients:  EntrepreneurPulse.com.au  Fact Sheet for Healthcare Providers:   IncredibleEmployment.be  This test is no t yet approved or cleared by the [place] FDA and  has been authorized for detection and/or diagnosis of SARS-CoV-2 by FDA under an Emergency Use Authorization (EUA). This EUA will remain  in effect (meaning this test can be used) for the duration of the COVID-19 declaration under Section 564(b)(1) of the Act, 21 U.S.C.section 360bbb-3(b)(1), unless the authorization is terminated  or revoked sooner.       Influenza A by PCR NEGATIVE NEGATIVE Final   Influenza B by PCR NEGATIVE NEGATIVE Final    Comment: (NOTE) The Xpert Xpress SARS-CoV-2/FLU/RSV plus assay is intended as an aid in the diagnosis of influenza from Nasopharyngeal swab specimens and should not be used as a sole basis for treatment. Nasal washings and aspirates are unacceptable for Xpert Xpress SARS-CoV-2/FLU/RSV testing.  Fact Sheet for Patients: EntrepreneurPulse.com.au  Fact Sheet for Healthcare Providers: IncredibleEmployment.be  This test is not yet approved or cleared by the [place] FDA and has been authorized for detection and/or diagnosis of SARS-CoV-2 by FDA under an Emergency Use Authorization (EUA). This EUA will remain in effect (meaning this test can be used) for the duration of the COVID-19 declaration under Section 564(b)(1) of the Act, 21 U.S.C. section 360bbb-3(b)(1), unless the authorization is terminated or revoked.  Performed at [facility], [address], [place], [place] [address]          Radiology Studies: DG Chest 2 View  Result Date: 04/19/2021 CLINICAL DATA:  Shortness of breath for several days. Hypoxia. EXAM: CHEST - 2 VIEW COMPARISON:  06/08/2016 FINDINGS: Moderate cardiomegaly shows no significant change. 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 A new tiny right  pleural effusion is seen. No evidence of acute pulmonary edema or other infiltrate. Calcified granuloma again seen in the right lower lung. Right shoulder prosthesis also noted. IMPRESSION: Stable cardiomegaly. New tiny right pleural effusion. Electronically Signed   By: Marlaine Hind M.D.   On: 04/19/2021 21:10        Scheduled Meds:  amiodarone  400 mg Oral BID   apixaban  5 mg Oral BID   Chlorhexidine Gluconate Cloth  6 each Topical Daily   donepezil  5 mg Oral QHS   ezetimibe  10 mg Oral Daily   furosemide  40 mg Intravenous BID   levothyroxine  50 mcg Oral Q0600   mometasone-formoterol  2 puff Inhalation BID   [START ON 04/22/2021] pantoprazole  40 mg Oral Daily   potassium chloride  40 mEq Oral Daily   pravastatin  40 mg Oral QHS   predniSONE  5 mg Oral Q breakfast   sodium chloride flush  3 mL Intravenous Q12H   tamoxifen  20 mg Oral Daily   ursodiol  600 mg Oral BID   venlafaxine XR  37.5 mg Oral Daily   Continuous Infusions:  sodium chloride Stopped (04/20/21 1336)   magnesium sulfate bolus IVPB       LOS: 1 day    Time spent: 32 minutes    Barb Merino, MD [facility] Pager [phone]

## 2021-04-21 NOTE — Evaluation (Signed)
Physical Therapy Evaluation Patient Details Name: Adriana Martin MRN: [id number] DOB: 1946-02-04 Today's Date: 04/21/2021   History of Present Illness  Pt is a 75 y/o F admitted on 04/19/21 with c/o SOB. Pt was admitted for CHF exacerbation. On 04/20/21 pt had v tach cardiac arrest & CPR & intubation with transfer to ICU. Pt extubated later that day. PMH: HTN, HLD, hypothyroidism, hx of heart block s/p permanent pace maker, COPD, CKD3B, a-fib, heart failure, breast CA, cirrhosis, dementia  Clinical Impression  Pt seen for PT evaluation with co-tx with OT. Pt received in bed with daughter present for session & assisting with providing PLOF & home set up information. Pt with poor cognition, only oriented to self & "hospital". Pt requires min assist for supine>sit with use of hospital bed features but mod assist +2 for sit>supine. Pt is able to stand EOB with RW & min assist +2 and attempts to perform peri hygiene before reporting not feeling well (unable to elaborate) & assisted back to bed. Pt rolls L<>R with mod assist to allow staff to perform peri hygiene. Pt would benefit from STR upon d/c to maximize independence with functional mobility & reduce fall risk prior to return home.  BP sitting EOB in RUE: 147/67 mmHg (MAP 86) BP supine in LLE: 123/86 mmHg (MAP 97)    Follow Up Recommendations SNF;Supervision/Assistance - 24 hour    Equipment Recommendations   (TBD in next venue)    Recommendations for Other Services       Precautions / Restrictions Precautions Precautions: Fall Restrictions Weight Bearing Restrictions: No      Mobility  Bed Mobility Overal bed mobility: Needs Assistance Bed Mobility: Supine to Sit;Sit to Supine, rolling  Rolling L<>R: Mod assist     Supine to sit: Min assist;HOB elevated Sit to supine: Mod assist;+2 for physical assistance   General bed mobility comments: use of bed rails    Transfers Overall transfer level: Needs assistance Equipment  used: Rolling walker (2 wheeled) Transfers: Sit to/from Stand Sit to Stand: Min assist;+2 physical assistance         General transfer comment: cuing for safe hand placement.  Ambulation/Gait                Stairs            Wheelchair Mobility    Modified Rankin (Stroke Patients Only)       Balance Overall balance assessment: Needs assistance Sitting-balance support: Bilateral upper extremity supported;Feet supported Sitting balance-Leahy Scale: Good     Standing balance support: During functional activity;Single extremity supported Standing balance-Leahy Scale: Fair                               Pertinent Vitals/Pain Pain Assessment: Faces Faces Pain Scale: Hurts little more Pain Location: chest (soreness 2/2 CPR) Pain Descriptors / Indicators: Sore Pain Intervention(s): Monitored during session;Repositioned (nurse notified)    Home Living Family/patient expects to be discharged to:: Private residence Living Arrangements: Alone Available Help at Discharge: Available PRN/intermittently;Family Type of Home: Mobile home Home Access: Ramped entrance     Home Layout: One level Home Equipment: Kasandra Knudsen - quad;Walker - 2 wheels      Prior Function Level of Independence: Needs assistance;Independent with assistive device(s)         Comments: Lives alone, 1 daughter [name]) checks on her PRN to assist with breakfast/dinner, otherwise pt eats snacks during the day (she doesn't cook).  Pt doesn't drive. Was receiving HHPT. Ambulates with QC & endorses 1 fall on July 4th resulting in jaw fx. Has a dog.     Hand Dominance        Extremity/Trunk Assessment   Upper Extremity Assessment Upper Extremity Assessment: Generalized weakness    Lower Extremity Assessment Lower Extremity Assessment: Generalized weakness       Communication   Communication: No difficulties  Cognition Arousal/Alertness: Awake/alert Behavior During Therapy: WFL  for tasks assessed/performed Overall Cognitive Status: History of cognitive impairments - at baseline Area of Impairment: Orientation;Attention;Memory;Following commands;Awareness;Safety/judgement;Problem solving                 Orientation Level: Disoriented to;Place;Time;Situation (reports she is in the "hospital" but unsure which one)   Memory: Decreased short-term memory Following Commands: Follows one step commands consistently Safety/Judgement: Decreased awareness of deficits;Decreased awareness of safety Awareness: Anticipatory;Emergent Problem Solving: Slow processing        General Comments General comments (skin integrity, edema, etc.): Pt stands with RW & attempts to perform peri hygiene with min assist for standing balance    Exercises     Assessment/Plan    PT Assessment Patient needs continued PT services  PT Problem List Decreased strength;Decreased mobility;Decreased safety awareness;Decreased activity tolerance;Decreased cognition;Decreased balance;Decreased knowledge of use of DME;Pain       PT Treatment Interventions DME instruction;Therapeutic activities;Cognitive remediation;Modalities;Therapeutic exercise;Gait training;Patient/family education;Balance training;Stair training;Functional mobility training;Neuromuscular re-education    PT Goals (Current goals can be found in the Care Plan section)  Acute Rehab PT Goals Patient Stated Goal: get better PT Goal Formulation: With patient/family Time For Goal Achievement: 05/05/21 Potential to Achieve Goals: Fair    Frequency Min 2X/week   Barriers to discharge Decreased caregiver support lives alone    Co-evaluation PT/OT/SLP Co-Evaluation/Treatment: Yes Reason for Co-Treatment: Complexity of the patient's impairments (multi-system involvement);For patient/therapist safety;To address functional/ADL transfers PT goals addressed during session: Mobility/safety with mobility;Balance;Proper use of DME          AM-PAC PT "6 Clicks" Mobility  Outcome Measure Help needed turning from your back to your side while in a flat bed without using bedrails?: A Little Help needed moving from lying on your back to sitting on the side of a flat bed without using bedrails?: A Lot Help needed moving to and from a bed to a chair (including a wheelchair)?: A Lot Help needed standing up from a chair using your arms (e.g., wheelchair or bedside chair)?: A Lot Help needed to walk in hospital room?: A Lot Help needed climbing 3-5 steps with a railing? : Total 6 Click Score: 12    End of Session   Activity Tolerance: Patient limited by fatigue Patient left: in bed;with call bell/phone within reach;with nursing/sitter in room;with family/visitor present Nurse Communication: Mobility status PT Visit Diagnosis: Difficulty in walking, not elsewhere classified (R26.2);Muscle weakness (generalized) (M62.81);Unsteadiness on feet (R26.81)    Time: [id number] PT Time Calculation (min) (ACUTE ONLY): 35 min   Charges:   PT Evaluation $PT Eval High Complexity: 1 High PT Treatments $Therapeutic Activity: 8-22 mins        Lavone Nian, PT, DPT 04/21/21, 2:49 PM   Waunita Schooner 04/21/2021, 2:47 PM

## 2021-04-21 NOTE — Progress Notes (Signed)
PHARMACIST - PHYSICIAN COMMUNICATION  CONCERNING: IV to Oral Route Change Policy  RECOMMENDATION: This patient is receiving pantoprazole by the intravenous route.  Based on criteria approved by the Pharmacy and Therapeutics Committee, the intravenous medication(s) is/are being converted to the equivalent oral dose form(s).   DESCRIPTION: These criteria include: The patient is eating (either orally or via tube) and/or has been taking other orally administered medications for a least 24 hours The patient has no evidence of active gastrointestinal bleeding or impaired GI absorption (gastrectomy, short bowel, patient on TNA or NPO).  If you have questions about this conversion, please contact the [facility] 04/21/2021 11:36 AM

## 2021-04-21 NOTE — Progress Notes (Signed)
[name] Date of Encounter: 04/21/2021  Hospital Problem List     Principal Problem:   Acute exacerbation of CHF (congestive heart failure) ([place]) Active Problems:   Anxiety   Primary biliary cholangitis (HCC)   A-fib (HCC)   Hyperlipidemia   HTN (hypertension)   COPD (chronic obstructive pulmonary disease) ([place])   HLD (hyperlipidemia)   Hypothyroid   Cirrhosis ([place])   Breast cancer ([place])   Dementia ([place])   CHF exacerbation ([place])    Patient Profile      75 y.o. female with history of hypertension, hyperlipidemia, hypothyroidism, history of heart block status post permanent pacemaker, COPD, CKD 3B, atrial fibrillation and heart failure felt to be HFpEF was admitted for the shortness of breath.  She also complained of lower extremity edema.  Home health nurse noted that her pulse ox was 87.  She had a recent admission for colitis and several medications were held due to AKI.  These medications included furosemide, lisinopril and diltiazem..  Echocardiogram done April 05, 2021 showed preserved LV function with an EF of 60 to 65% with left and right atrial enlargement.  Her bioprosthetic aortic valve mean gradient had increased slightly.  However grossly there was no change in the valve prosthesis.  She was admitted and placed on diuresis.  EKG on admission showed probable atrial flutter with ventricular paced rhythm.  Troponin on admission was 47 currently 82 with more pending.  Potassium on admission was 3.0.  Currently 3.4.  Magnesium is 1.9.  Patient developed spontaneous ventricular tachycardia with cyanosis and no pulse.  CPR and ACLS protocol was begun.  ROSC was achieved after 2 minutes and receiving an amiodarone bolus x1 and 1 electrical shock.  Patient was intubated and transferred to the ICU for management.    Subjective   Currently extubated and doing well.  Alert and oriented.  Denies shortness of breath.  Inpatient Medications     amiodarone  400  mg Oral BID   apixaban  5 mg Oral BID   Chlorhexidine Gluconate Cloth  6 each Topical Daily   donepezil  5 mg Oral QHS   ezetimibe  10 mg Oral Daily   furosemide  40 mg Intravenous BID   levothyroxine  50 mcg Oral Q0600   mometasone-formoterol  2 puff Inhalation BID   [START ON 04/22/2021] pantoprazole  40 mg Oral Daily   potassium chloride  40 mEq Oral TID   pravastatin  40 mg Oral QHS   predniSONE  5 mg Oral Q breakfast   sodium chloride flush  3 mL Intravenous Q12H   tamoxifen  20 mg Oral Daily   ursodiol  600 mg Oral BID   venlafaxine XR  37.5 mg Oral Daily    Vital Signs    Vitals:   04/20/21 2300 04/21/21 0000 04/21/21 0700 04/21/21 1122  BP: 137/61 130/74 137/74 114/81  Pulse: 60 60 (!) 59 61  Resp: '16 16 18 '$ (!) 21  Temp:  98.1 F (36.7 C) (!) 97.5 F (36.4 C) 97.8 F (36.6 C)  TempSrc:  Oral    SpO2: 100% 100% 100% 95%  Weight:      Height:        Intake/Output Summary (Last 24 hours) at 04/21/2021 1317 Last data filed at 04/21/2021 1100 Gross per 24 hour  Intake 954.5 ml  Output 1710 ml  Net -755.5 ml   Filed Weights   04/19/21 2023  Weight: 78 kg    Physical  Exam    GEN: Well nourished, well developed, in no acute distress.  HEENT: normal.  Neck: Supple, no JVD, carotid bruits, or masses. Cardiac: RRR, no murmurs, rubs, or gallops.  Respiratory:  Respirations regular and unlabored, clear to auscultation bilaterally. GI: Soft, nontender, nondistended, BS + x 4. MS: no deformity or atrophy. Skin: warm and dry, no rash. Neuro:  Strength and sensation are intact. Psych: Normal affect.  Labs    CBC Recent Labs    04/19/21 2152 04/21/21 0546  WBC 9.6 13.9*  HGB 12.1 10.1*  HCT 39.6 31.3*  MCV 98.0 93.7  PLT 202 [id number]   Basic Metabolic Panel Recent Labs    04/20/21 1114 04/20/21 1848 04/21/21 0546  NA 136  --  138  K 3.4* 5.1 4.6  CL 96*  --  98  CO2 24  --  28  GLUCOSE 181*  --  115*  BUN 21  --  28*  CREATININE 1.33*  --  1.42*   CALCIUM 8.6*  --  8.9  MG 1.9 2.2 2.2  PHOS 4.1  --  3.8   Liver Function Tests Recent Labs    04/20/21 0345 04/20/21 1114  AST 117* 181*  ALT 159* 191*  ALKPHOS 60 61  BILITOT 0.9 0.9  PROT 6.7 6.5  ALBUMIN 3.1* 2.9*   No results for input(s): LIPASE, AMYLASE in the last 72 hours. Cardiac Enzymes No results for input(s): CKTOTAL, CKMB, CKMBINDEX, TROPONINI in the last 72 hours. BNP Recent Labs    04/19/21 2152  BNP 558.3*   D-Dimer No results for input(s): DDIMER in the last 72 hours. Hemoglobin A1C No results for input(s): HGBA1C in the last 72 hours. Fasting Lipid Panel Recent Labs    04/21/21 0546  TRIG 81   Thyroid Function Tests No results for input(s): TSH, T4TOTAL, T3FREE, THYROIDAB in the last 72 hours.  Invalid input(s): FREET3  Telemetry      ECG    Atrial flutter  Radiology    DG Chest 2 View  Result Date: 04/19/2021 CLINICAL DATA:  Shortness of breath for several days. Hypoxia. EXAM: CHEST - 2 VIEW COMPARISON:  06/08/2016 FINDINGS: Moderate cardiomegaly shows no significant change. 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 A new tiny right pleural effusion is seen. No evidence of acute pulmonary edema or other infiltrate. Calcified granuloma again seen in the right lower lung. Right shoulder prosthesis also noted. IMPRESSION: Stable cardiomegaly. New tiny right pleural effusion. Electronically Signed   By: Marlaine Hind M.D.   On: 04/19/2021 21:10   ECHOCARDIOGRAM COMPLETE  Result Date: 04/05/2021    ECHOCARDIOGRAM REPORT   Patient Name:   Adriana Martin Date of Exam: 04/05/2021 Medical Rec #:  [id number]            Height:       62.0 in Accession #:    [id number]           Weight:       164.0 lb Date of Birth:  Dec 26, 1945            BSA:          1.757 m Patient Age:    35 years             BP:           109/51 mmHg Patient Gender: F  HR:           67 bpm. Exam Location:  [facility] Procedure: 2D Echo Indications:     Atrial Flutter I48.92  History:         Patient has no prior history of Echocardiogram examinations.  Sonographer:     Kathlen Brunswick RDCS Referring Phys:  [id number] [place] Diagnosing Phys: Skeet Latch MD IMPRESSIONS  1. Left ventricular ejection fraction, by estimation, is 60 to 65%. The left ventricle has normal function. The left ventricle has no regional wall motion abnormalities. Left ventricular diastolic parameters are indeterminate.  2. Right ventricular systolic function is normal. The right ventricular size is normal. There is normal pulmonary artery systolic pressure.  3. Left atrial size was severely dilated.  4. Right atrial size was severely dilated.  5. Compared with the echo 02/2020, mean bioprosthetic mitral valve gradient has increased from 10 mmHg to 16 mmHg. Visually the valve appears unchanged from 02/2020. Gradients may be elevated in the setting of high flow and sepsis. No increased thickness or vegetations concerning for endocarditis. The mitral valve has been repaired/replaced. No evidence of mitral valve regurgitation. Severe mitral stenosis. Echo findings are consistent with normal structure and function of the mitral valve prosthesis.  6. The aortic valve is tricuspid. Aortic valve regurgitation is trivial. No aortic stenosis is present.  7. The inferior vena cava is normal in size with greater than 50% respiratory variability, suggesting right atrial pressure of 3 mmHg. FINDINGS  Left Ventricle: Left ventricular ejection fraction, by estimation, is 60 to 65%. The left ventricle has normal function. The left ventricle has no regional wall motion abnormalities. The left ventricular internal cavity size was normal in size. There is  no left ventricular hypertrophy. Left ventricular diastolic parameters are indeterminate. Right Ventricle: The right ventricular size is normal. No increase in right  ventricular wall thickness. Right ventricular systolic function is normal. There is normal pulmonary artery systolic pressure. The tricuspid regurgitant velocity is 2.35 m/s, and  with an assumed right atrial pressure of 3 mmHg, the estimated right ventricular systolic pressure is [id number] mmHg. Left Atrium: Left atrial size was severely dilated. Right Atrium: Right atrial size was severely dilated. Pericardium: There is no evidence of pericardial effusion. Mitral Valve: Compared with the echo 02/2020, mean bioprosthetic mitral valve gradient has increased from 10 mmHg to 16 mmHg. Visually the valve appears unchanged from 02/2020. Gradients may be elevated in the setting of high flow and sepsis. No increased thickness or vegetations concerning for endocarditis. The mitral valve has been repaired/replaced. No evidence of mitral valve regurgitation. Echo findings are consistent with normal structure and function of the mitral valve prosthesis. Severe mitral valve stenosis. MV peak gradient, 38.4 mmHg. The mean mitral valve gradient is 16.0 mmHg. Tricuspid Valve: The tricuspid valve is normal in structure. Tricuspid valve regurgitation is trivial. No evidence of tricuspid stenosis. Aortic Valve: The aortic valve is tricuspid. Aortic valve regurgitation is trivial. No aortic stenosis is present. Aortic valve mean gradient measures 6.0 mmHg. Aortic valve peak gradient measures 13.7 mmHg. Aortic valve area, by VTI measures 1.44 cm. Pulmonic Valve: The pulmonic valve was normal in structure. Pulmonic valve regurgitation is not visualized. No evidence of pulmonic stenosis. Aorta: The aortic root is normal in size and structure. Venous: The inferior vena cava is normal in size with greater than 50% respiratory variability, suggesting right atrial pressure of 3 mmHg. IAS/Shunts: No atrial level shunt detected by color flow Doppler.  LEFT VENTRICLE PLAX  2D LVIDd:         4.30 cm  Diastology LVIDs:         3.00 cm  LV e' medial:     4.35 cm/s LV PW:         1.40 cm  LV E/e' medial:  49.4 LV IVS:        1.00 cm  LV e' lateral:   4.68 cm/s LVOT diam:     1.80 cm  LV E/e' lateral: 45.9 LV SV:         43 LV SV Index:   25 LVOT Area:     2.54 cm  LEFT ATRIUM           Index       RIGHT ATRIUM           Index LA diam:      4.80 cm 2.73 cm/m  RA Area:     23.50 cm LA Vol (A2C): 32.1 ml 18.27 ml/m RA Volume:   77.90 ml  44.33 ml/m LA Vol (A4C): 71.0 ml 40.41 ml/m  AORTIC VALVE                    PULMONIC VALVE AV Area (Vmax):    1.22 cm     PV Vmax:       0.91 m/s AV Area (Vmean):   1.28 cm     PV Peak grad:  3.3 mmHg AV Area (VTI):     1.44 cm AV Vmax:           185.00 cm/s AV Vmean:          114.000 cm/s AV VTI:            0.300 m AV Peak Grad:      13.7 mmHg AV Mean Grad:      6.0 mmHg LVOT Vmax:         88.70 cm/s LVOT Vmean:        57.200 cm/s LVOT VTI:          0.170 m LVOT/AV VTI ratio: 0.57  AORTA Ao Root diam: 2.90 cm Ao Asc diam:  3.20 cm MITRAL VALVE                TRICUSPID VALVE MV Area (PHT): 0.96 cm     TV Peak grad:   25.8 mmHg MV Area VTI:   0.32 cm     TV Vmax:        2.54 m/s MV Peak grad:  38.4 mmHg    TR Peak grad:   22.1 mmHg MV Mean grad:  16.0 mmHg    TR Vmax:        235.00 cm/s MV Vmax:       3.10 m/s MV Vmean:      184.0 cm/s   SHUNTS MV Decel Time: 789 msec     Systemic VTI:  0.17 m MV E velocity: 215.00 cm/s  Systemic Diam: 1.80 cm Skeet Latch MD Electronically signed by Skeet Latch MD Signature Date/Time: 04/05/2021/1:33:45 PM    Final    CT Angio Abd/Pel w/ and/or w/o  Result Date: 04/04/2021 CLINICAL DATA:  Rectal bleeding, Dementia, recent fall EXAM: CTA ABDOMEN AND PELVIS WITHOUT AND WITH CONTRAST TECHNIQUE: Multidetector CT imaging of the abdomen and pelvis was performed using the standard protocol during bolus administration of intravenous contrast. Multiplanar reconstructed images and MIPs were obtained and reviewed to evaluate the vascular anatomy. CONTRAST:  80m OMNIPAQUE IOHEXOL 350  MG/ML  SOLN COMPARISON:  03/20/2013 FINDINGS: VASCULAR Aorta: Moderate calcified atheromatous plaque. No aneurysm, dissection, or stenosis. Celiac: Calcified ostial plaque without stenosis, patent distally within remarkable branch anatomy. SMA: Ostial plaque extending over length of approximately 1.6 cm resulting in moderate stenosis. Vessels patent distally with classic branch anatomy. Renals: Single left, with proximal branching. No stenosis. Duplicated right, superior dominant, both patent. IMA: Patent without evidence of aneurysm, dissection, vasculitis or significant stenosis. Inflow: Mild plaque in bilateral common iliac arteries without stenosis. No dissection or aneurysm. Proximal Outflow: Bilateral common femoral and visualized portions of the superficial and profunda femoral arteries are patent without evidence of aneurysm, dissection, vasculitis or significant stenosis. Veins: Patent hepatic veins, portal vein, SMV, splenic vein, bilateral renal veins. Scattered mural calcifications in right common femoral and common iliac veins suggesting sequela of previous central venous catheter or DVT. IVC is nondistended, unremarkable. Review of the MIP images confirms the above findings. NON-VASCULAR Lower chest: Cardiomegaly with biatrial enlargement. Transvenous pacing leads partially visualized. Mitral valve replacement. No pleural or pericardial effusion. Hepatobiliary: No focal liver abnormality is seen. No gallstones, gallbladder wall thickening, or biliary dilatation. Pancreas: Unremarkable. No pancreatic ductal dilatation or surrounding inflammatory changes. Spleen: Normal in size without focal abnormality. Adrenals/Urinary Tract: Adrenal glands unremarkable. Symmetric renal enhancement. No hydronephrosis or urolithiasis. Urinary bladder incompletely distended. Stomach/Bowel: Stomach and small bowel are nondistended. Appendix not identified. No pericecal inflammatory change. The colon is nondilated. Suspect mild  circumferential wall thickening in the splenic flexure with mild adjacent inflammatory change. Scattered sigmoid diverticula without adjacent inflammatory change. No evidence of active extravasation. Lymphatic: No abdominal or pelvic adenopathy. Reproductive: Status post hysterectomy. No adnexal masses. Other: Bilateral pelvic phleboliths.  No ascites.  No free air. Musculoskeletal: Chronic T8 and L1 vertebral body compression deformities stable since 07/27/2019. Stable grade 1 anterolisthesis L4-5 without pars defect. No acute fracture or aggressive bone lesion. IMPRESSION: 1. No evidence of active GI bleeding. 2. Wall thickening in the splenic flexure of the colon suggesting colitis, nonspecific. Consider GI consultation. 3. Sigmoid diverticulosis Electronically Signed   By: Lucrezia Europe M.D.   On: 04/04/2021 12:02    Assessment & Plan    75 year old female with HFpEF admitted with progressive shortness of breath and edema.  Is of note she was taken off of her Cardizem, lisinopril and diuretics after recent admission.  She had not resumed these.  She presented with shortness of breath felt to be volume overloaded.  Today developed what appeared to be ventricular tachycardia.  ROSC was returned with 1 shock and a bolus of amiodarone.  Currently is intubated ventricular pacing at 60.  She is hypotensive.    1.  Ventricular tachycardia etiology unclear.  Patient was mildly hypokalemic on presentation.  EF is normal.  We will continue with amiodarone drip and follow.  Troponin is elevated post VT arrest and defibrillation.  Unclear whether ischemia is playing a role but the elevated troponin is not an expected postarrest.  Will convert to p.o. amiodarone and maintain potassium greater than 4 magnesium greater than 2.  We will consider functional study when stable.  Patient is alert and oriented and hemodynamically stable.  2.  Atrial fibrillation-currently ventricular paced.  We will continue with amiodarone and  convert from IV to p.o. and follow.  We will continue with Eliquis and follow renal function.   3.  Respiratory failure-status postarrest.  Wean vent as tolerated.  Has history of COPD.  Will follow.    4.  Hypotension-has resolved.  Signed, Javier Docker Kristoffer Bala MD 04/21/2021, 1:17 PM  Pager: [phone]

## 2021-04-21 NOTE — Evaluation (Signed)
Occupational Therapy Evaluation Patient Details Name: Adriana Martin MRN: [id number] DOB: 1946/05/15 Today's Date: 04/21/2021    History of Present Illness Pt is a 75 y/o F admitted on 04/19/21 with c/o SOB. Pt was admitted for CHF exacerbation. On 04/20/21 pt had v tach cardiac arrest & CPR & intubation with transfer to ICU. Pt extubated later that day. PMH: HTN, HLD, hypothyroidism, hx of heart block s/p permanent pace maker, COPD, CKD3B, a-fib, heart failure, breast CA, cirrhosis, dementia   Clinical Impression   Adriana Martin presents to OT with generalized weakness and impaired cognition that impacts her ability to safely and independently complete self care tasks.  Prior to admission, she lived alone and was able to complete ADLs with mod I using quad cane.  Her daughter lives next door and provided intermittent assist for medications, meals, and transportation.  Currently, pt requires setup-min A for seated UB ADLs and mod-max assist for LB dressing/bathing.  OT provided mod A for bed mobility and min A for sit to stand transfer with RW.  Pt attempted to perform pericare independently, but impulsively laid back down on bed and was unable to verbalize her needs.  OT and PT assisted with safe transfer back to bed and performed total A for perihygiene and soiled bed.  Pt presents with disorientation, delayed processing, and impaired memory that impacts safety in ADLs.  She was able to follow one-step commands with increased time.  Adriana Martin will likely continue to benefit from skilled OT services in acute setting to address functional strengthening, cognition and safety awareness, and safety and independence in ADLs.  Recommend SNF upon discharge.    Follow Up Recommendations  SNF    Equipment Recommendations  Other (comment) (defer to next level of care)    Recommendations for Other Services       Precautions / Restrictions Precautions Precautions: Fall Restrictions Weight Bearing  Restrictions: No      Mobility Bed Mobility Overal bed mobility: Needs Assistance Bed Mobility: Supine to Sit;Sit to Supine     Supine to sit: Min assist;HOB elevated Sit to supine: Mod assist;+2 for physical assistance   General bed mobility comments: use of bed rails    Transfers Overall transfer level: Needs assistance Equipment used: Rolling walker (2 wheeled) Transfers: Sit to/from Stand Sit to Stand: Min assist;+2 physical assistance Stand pivot transfers: Min assist       General transfer comment: cuing for safe hand placement.    Balance Overall balance assessment: Needs assistance Sitting-balance support: Bilateral upper extremity supported;Feet supported Sitting balance-Leahy Scale: Good     Standing balance support: During functional activity;Single extremity supported Standing balance-Leahy Scale: Fair Standing balance comment: requires BUE support                           ADL either performed or assessed with clinical judgement   ADL Overall ADL's : Needs assistance/impaired                                       General ADL Comments: Pt generally requires setup-min A for upper body ADLs (feeding, grooming, upper body dressing/bathing), more assist required for lower body dressing/bathing. Requires min A for functional transfers with RW, min-mod A for bed mobility     Vision Patient Visual Report: No change from baseline       Perception  Praxis      Pertinent Vitals/Pain Pain Assessment: Faces Faces Pain Scale: Hurts little more Pain Location: chest (soreness 2/2 CPR) Pain Descriptors / Indicators: Sore Pain Intervention(s): Monitored during session;Repositioned     Hand Dominance Right   Extremity/Trunk Assessment Upper Extremity Assessment Upper Extremity Assessment: Generalized weakness   Lower Extremity Assessment Lower Extremity Assessment: Generalized weakness       Communication  Communication Communication: No difficulties   Cognition Arousal/Alertness: Awake/alert Behavior During Therapy: WFL for tasks assessed/performed Overall Cognitive Status: History of cognitive impairments - at baseline Area of Impairment: Orientation;Attention;Memory;Following commands;Awareness;Safety/judgement;Problem solving                 Orientation Level: Disoriented to;Place;Time;Situation (reports she is in the "hospital" but unsure which one)   Memory: Decreased short-term memory Following Commands: Follows one step commands consistently Safety/Judgement: Decreased awareness of deficits;Decreased awareness of safety Awareness: Anticipatory;Emergent Problem Solving: Slow processing General Comments: hx of dementia, only oriented to self & city.  Unable to state needs when grimacing/guarding her movement.   General Comments  Pt stands with RW & attempts to perform peri hygiene with min assist for standing balance    Exercises Other Exercises Other Exercises: provided education re: OT role and plan of care, fall and safety precautions, self care.  Provided assist for bed mobility, sit to stand transfers, perihygiene, dressing   Shoulder Instructions      Home Living Family/patient expects to be discharged to:: Private residence Living Arrangements: Alone Available Help at Discharge: Available PRN/intermittently;Family Type of Home: Mobile home Home Access: Ramped entrance Entrance Stairs-Number of Steps: pt unable to report actual number, somewhere between 5-10   Home Layout: One level     Bathroom Shower/Tub: Occupational psychologist: [place]: Latina Craver - 2 wheels;Shower seat;Grab bars - tub/shower   Additional Comments: Pt's daughter lives next door but does work during the day      Prior Functioning/Environment Level of Independence: Needs Product/process development scientist / Transfers Assistance Needed: use of QC ADL's / Homemaking  Assistance Needed: Pt completes basic ADLs independently but receives assist from family for meals, meds, transportation, IADLs   Comments: Lives alone, 1 daughter [name]) checks on her PRN to assist with breakfast/dinner, otherwise pt eats snacks during the day (she doesn't cook). Pt doesn't drive. Was receiving HHPT. Ambulates with QC & endorses 1 fall on July 4th resulting in jaw fx. Has a dog.        OT Problem List: Decreased strength;Decreased activity tolerance;Impaired balance (sitting and/or standing);Decreased range of motion;Decreased cognition;Decreased safety awareness;Decreased knowledge of use of DME or AE;Cardiopulmonary status limiting activity;Pain      OT Treatment/Interventions: Self-care/ADL training;Therapeutic exercise;DME and/or AE instruction;Therapeutic activities;Cognitive remediation/compensation;Patient/family education;Balance training    OT Goals(Current goals can be found in the care plan section) Acute Rehab OT Goals Patient Stated Goal: get better OT Goal Formulation: With patient/family Time For Goal Achievement: 05/05/21 Potential to Achieve Goals: Fair  OT Frequency: Min 2X/week   Barriers to D/C: Decreased caregiver support          Co-evaluation PT/OT/SLP Co-Evaluation/Treatment: Yes Reason for Co-Treatment: Complexity of the patient's impairments (multi-system involvement);For patient/therapist safety;To address functional/ADL transfers PT goals addressed during session: Mobility/safety with mobility;Balance OT goals addressed during session: ADL's and self-care      AM-PAC OT "6 Clicks" Daily Activity     Outcome Measure Help from another person eating meals?: A Little Help from another  person taking care of personal grooming?: A Little Help from another person toileting, which includes using toliet, bedpan, or urinal?: A Lot Help from another person bathing (including washing, rinsing, drying)?: A Lot Help from another person to put on  and taking off regular upper body clothing?: A Little Help from another person to put on and taking off regular lower body clothing?: A Lot 6 Click Score: 15   End of Session Equipment Utilized During Treatment: Rolling walker  Activity Tolerance: Patient limited by fatigue Patient left: in bed;with call bell/phone within reach;with bed alarm set;with nursing/sitter in room;with family/visitor present  OT Visit Diagnosis: Muscle weakness (generalized) (M62.81);Other symptoms and signs involving cognitive function                Time: 1357-1432 OT Time Calculation (min): 35 min Charges:  OT General Charges $OT Visit: 1 Visit OT Evaluation $OT Eval Moderate Complexity: 1 Mod OT Treatments $Self Care/Home Management : 8-22 mins  Jeneen Montgomery, OTR/L 04/21/21, 4:20 PM

## 2021-04-21 NOTE — Progress Notes (Signed)
Patient alert with confusion. Patient has been on room air since 10:00 am. No complaints of shortness of breath, does have discomfort when repositioning states from chest compressions. Cardiology into see patient and family this afternoon. Tolerating diet, foley intact. PT consulted. Family at bedside.

## 2021-04-22 DIAGNOSIS — I469 Cardiac arrest, cause unspecified: Secondary | ICD-10-CM | POA: Diagnosis not present

## 2021-04-22 DIAGNOSIS — I482 Chronic atrial fibrillation, unspecified: Secondary | ICD-10-CM

## 2021-04-22 DIAGNOSIS — R1013 Epigastric pain: Secondary | ICD-10-CM | POA: Diagnosis not present

## 2021-04-22 DIAGNOSIS — I5033 Acute on chronic diastolic (congestive) heart failure: Secondary | ICD-10-CM | POA: Diagnosis not present

## 2021-04-22 LAB — MAGNESIUM: Magnesium: 1.9 mg/dL (ref 1.7–2.4)

## 2021-04-22 LAB — BASIC METABOLIC PANEL
Anion gap: 10 (ref 5–15)
BUN: 28 mg/dL — ABNORMAL HIGH (ref 8–23)
CO2: 27 mmol/L (ref 22–32)
Calcium: 8.6 mg/dL — ABNORMAL LOW (ref 8.9–10.3)
Chloride: 98 mmol/L (ref 98–111)
Creatinine, Ser: 1.4 mg/dL — ABNORMAL HIGH (ref 0.44–1.00)
GFR, Estimated: 39 mL/min — ABNORMAL LOW (ref >60)
Glucose, Bld: 101 mg/dL — ABNORMAL HIGH (ref 70–99)
Potassium: 4.2 mmol/L (ref 3.5–5.1)
Sodium: 135 mmol/L (ref 135–145)

## 2021-04-22 LAB — PHOSPHORUS: Phosphorus: 2.9 mg/dL (ref 2.5–4.6)

## 2021-04-22 MED ORDER — ONDANSETRON HCL 4 MG/2ML IJ SOLN
4.0000 mg | Freq: Four times a day (QID) | INTRAMUSCULAR | Status: DC | PRN
  Administered 2021-04-22: 4 mg via INTRAVENOUS
  Filled 2021-04-22: qty 2

## 2021-04-22 MED ORDER — PANTOPRAZOLE SODIUM 40 MG PO TBEC
40.0000 mg | DELAYED_RELEASE_TABLET | Freq: Two times a day (BID) | ORAL | Status: DC
  Administered 2021-04-22 – 2021-04-28 (×12): 40 mg via ORAL
  Filled 2021-04-22 (×12): qty 1

## 2021-04-22 MED ORDER — OXYCODONE HCL 5 MG PO TABS
5.0000 mg | ORAL_TABLET | Freq: Four times a day (QID) | ORAL | Status: DC | PRN
  Administered 2021-04-22 – 2021-04-27 (×8): 5 mg via ORAL
  Filled 2021-04-22 (×8): qty 1

## 2021-04-22 MED ORDER — OXYCODONE HCL 5 MG PO TABS: 5.0000 mg | ORAL_TABLET | ORAL | Status: DC | PRN

## 2021-04-22 MED ORDER — TRAZODONE HCL 50 MG PO TABS
25.0000 mg | ORAL_TABLET | Freq: Every evening | ORAL | Status: DC | PRN
  Administered 2021-04-23 – 2021-04-27 (×4): 25 mg via ORAL
  Filled 2021-04-22 (×4): qty 1

## 2021-04-22 NOTE — Progress Notes (Signed)
Patient ID: Myangel Passini, female   DOB: July 06, 1946, 75 y.o.   MRN: [id number] [facility] PROGRESS NOTE  Chandel Dingmann [id number] DOB: [birth year] DOA: 04/19/2021 PCP: [name], MD (Inactive)  HPI/Subjective: Patient this morning had some nausea and some epigastric pain and lower chest pain.  Part of it was reproducible with pressing on the xiphoid process.  Admitted with a ventricular fibrillation cardiac arrest.  Objective: Vitals:   04/22/21 0452 04/22/21 0745  BP: 133/79 (!) 143/67  Pulse: (!) 59 60  Resp: 16 18  Temp: 97.9 F (36.6 C) 98 F (36.7 C)  SpO2: 91% 96%    Intake/Output Summary (Last 24 hours) at 04/22/2021 1435 Last data filed at 04/22/2021 0424 Gross per 24 hour  Intake 355 ml  Output 600 ml  Net -245 ml   Filed Weights   04/19/21 2023  Weight: 78 kg    ROS: Review of Systems  Respiratory:  Negative for shortness of breath.   Cardiovascular:  Positive for chest pain.  Gastrointestinal:  Positive for abdominal pain. Negative for nausea and vomiting.  Exam: Physical Exam HENT:     Head: Normocephalic.     Mouth/Throat:     Pharynx: No oropharyngeal exudate.  Eyes:     General: Lids are normal.     Conjunctiva/sclera: Conjunctivae normal.     Pupils: Pupils are equal, round, and reactive to light.  Cardiovascular:     Rate and Rhythm: Normal rate and regular rhythm.     Heart sounds: Normal heart sounds, S1 normal and S2 normal.  Pulmonary:     Breath sounds: Normal breath sounds. No decreased breath sounds, wheezing, rhonchi or rales.  Abdominal:     Palpations: Abdomen is soft.     Tenderness: There is no abdominal tenderness.  Musculoskeletal:     Right lower leg: Swelling present.     Left lower leg: Swelling present.  Skin:    General: Skin is warm.     Findings: No rash.  Neurological:     Mental Status: She is alert.     Comments: Answers all questions appropriately.     Data Reviewed: Basic Metabolic  Panel: Recent Labs  Lab 04/19/21 2152 04/19/21 2358 04/20/21 0345 04/20/21 1114 04/20/21 1848 04/21/21 0546 04/22/21 0715  NA 136  --  137 136  --  138 135  K 3.6  --  3.0* 3.4* 5.1 4.6 4.2  CL 100  --  100 96*  --  98 98  CO2 25  --  27 24  --  28 27  GLUCOSE 82  --  98 181*  --  115* 101*  BUN 23  --  22 21  --  28* 28*  CREATININE 1.22*  --  1.08* 1.33*  --  1.42* 1.40*  CALCIUM 9.1  --  8.9 8.6*  --  8.9 8.6*  MG  --  2.1  --  1.9 2.2 2.2 1.9  PHOS  --   --   --  4.1  --  3.8 2.9   Liver Function Tests: Recent Labs  Lab 04/20/21 0345 04/20/21 1114  AST 117* 181*  ALT 159* 191*  ALKPHOS 60 61  BILITOT 0.9 0.9  PROT 6.7 6.5  ALBUMIN 3.1* 2.9*    CBC: Recent Labs  Lab 04/19/21 2152 04/21/21 0546  WBC 9.6 13.9*  HGB 12.1 10.1*  HCT 39.6 31.3*  MCV 98.0 93.7  PLT 202 191    BNP (last 3 results)  Recent Labs    04/04/21 1329 04/19/21 2152  BNP 268.0* 558.3*     CBG: Recent Labs  Lab 04/20/21 1034  GLUCAP 180*    Recent Results (from the past 240 hour(s))  Resp Panel by RT-PCR (Flu A&B, Covid) Nasopharyngeal Swab     Status: None   Collection Time: 04/19/21 10:23 PM   Specimen: Nasopharyngeal Swab; Nasopharyngeal(NP) swabs in vial transport medium  Result Value Ref Range Status   SARS Coronavirus 2 by RT PCR NEGATIVE NEGATIVE Final    Comment: (NOTE) SARS-CoV-2 target nucleic acids are NOT DETECTED.  The SARS-CoV-2 RNA is generally detectable in upper respiratory specimens during the acute phase of infection. The lowest concentration of SARS-CoV-2 viral copies this assay can detect is 138 copies/mL. A negative result does not preclude SARS-Cov-2 infection and should not be used as the sole basis for treatment or other patient management decisions. A negative result may occur with  improper specimen collection/handling, submission of specimen other than nasopharyngeal swab, presence of viral mutation(s) within the areas targeted by this assay,  and inadequate number of viral copies(<138 copies/mL). A negative result must be combined with clinical observations, patient history, and epidemiological information. The expected result is Negative.  Fact Sheet for Patients:  EntrepreneurPulse.com.au  Fact Sheet for Healthcare Providers:  IncredibleEmployment.be  This test is no t yet approved or cleared by the [place] FDA and  has been authorized for detection and/or diagnosis of SARS-CoV-2 by FDA under an Emergency Use Authorization (EUA). This EUA will remain  in effect (meaning this test can be used) for the duration of the COVID-19 declaration under Section 564(b)(1) of the Act, 21 U.S.C.section 360bbb-3(b)(1), unless the authorization is terminated  or revoked sooner.       Influenza A by PCR NEGATIVE NEGATIVE Final   Influenza B by PCR NEGATIVE NEGATIVE Final    Comment: (NOTE) The Xpert Xpress SARS-CoV-2/FLU/RSV plus assay is intended as an aid in the diagnosis of influenza from Nasopharyngeal swab specimens and should not be used as a sole basis for treatment. Nasal washings and aspirates are unacceptable for Xpert Xpress SARS-CoV-2/FLU/RSV testing.  Fact Sheet for Patients: EntrepreneurPulse.com.au  Fact Sheet for Healthcare Providers: IncredibleEmployment.be  This test is not yet approved or cleared by the [place] FDA and has been authorized for detection and/or diagnosis of SARS-CoV-2 by FDA under an Emergency Use Authorization (EUA). This EUA will remain in effect (meaning this test can be used) for the duration of the COVID-19 declaration under Section 564(b)(1) of the Act, 21 U.S.C. section 360bbb-3(b)(1), unless the authorization is terminated or revoked.  Performed at [facility], [address] [place], [place] [address]       Scheduled Meds:  amiodarone  400 mg Oral BID   apixaban  5 mg Oral BID    donepezil  5 mg Oral QHS   ezetimibe  10 mg Oral Daily   furosemide  40 mg Intravenous BID   levothyroxine  50 mcg Oral Q0600   mometasone-formoterol  2 puff Inhalation BID   pantoprazole  40 mg Oral BID   potassium chloride  40 mEq Oral Daily   pravastatin  40 mg Oral QHS   predniSONE  5 mg Oral Q breakfast   sodium chloride flush  3 mL Intravenous Q12H   tamoxifen  20 mg Oral Daily   ursodiol  600 mg Oral BID   venlafaxine XR  37.5 mg Oral Daily    Assessment/Plan:  Ventricular fibrillation  cardiac arrest.  Chronic A. fib.  Case discussed with Dr. Ubaldo Glassing cardiology.  Patient on oral amiodarone and Eliquis.  Cardiology may want to do a stress test at some point. Epigastric pain and chest pain could be secondary to CPR.  Increase Protonix to twice daily dosing.  As needed nausea medications. Chronic atrial fibrillation/flutter.  Patient has a pacemaker.  On amiodarone for rate control.  On Eliquis for anticoagulation. Acute on chronic diastolic congestive heart failure.  Patient on IV Lasix and twice daily.  Wean to room air. 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  Creatinine 1.4 today.  Watch closely with diuresis. Hyperlipidemia unspecified on pravastatin and Zetia Memory loss on donezepil Breast cancer on tamoxifen treatment Hypothyroidism unspecified on levothyroxine Weakness.  Physical therapy recommending rehab        Code Status:     Code Status Orders  (From admission, onward)           Start     Ordered   04/19/21 2356  Full code  Continuous        04/19/21 2356           Code Status History     Date Active Date Inactive Code Status Order ID Comments User Context   04/04/2021 1329 04/07/2021 2301 Full Code [id number]  Ivor Costa, MD ED   03/10/2020 1753 03/15/2020 2314 Full Code [id number]  Amie Portland, MD Inpatient   01/07/2018 1305 01/08/2018 1741 Full Code [id number]  Leim Fabry, MD Inpatient    06/07/2016 0710 06/07/2016 0937 Full Code [id number]  Mikael Spray, NP ED   03/22/2016 1232 03/24/2016 1432 Full Code [id number]  Theodoro Grist, MD Inpatient   05/02/2015 1443 05/03/2015 2003 Full Code [id number]  Vaughan Basta, MD Inpatient      Advance Directive Documentation    Flowsheet Row Most Recent Value  Type of Advance Directive Healthcare Power of Attorney, Living will  Pre-existing out of facility DNR order (yellow form or pink MOST form) --  "MOST" Form in Place? --      Family Communication: Spoke with patient's daughter on the phone Disposition Plan: Status is: Inpatient  Dispo: The patient is from: Home              Anticipated d/c is to: Rehab              Patient currently still on IV Lasix.  Hopefully can convert over to oral in the next day or so.   Difficult to place patient.  No.  Consultants: Cardiology  Time spent: 28 minutes  [place]

## 2021-04-22 NOTE — TOC Initial Note (Signed)
Transition of Care [facility]) - Initial/Assessment Note    Patient Details  Name: Adriana Martin MRN: [id number] Date of Birth: Mar 03, 1946  [facility] CM/SW Contact:    Alberteen Sam, LCSW Phone Number: 04/22/2021, 10:18 AM  Clinical Narrative:                  CSW spoke with patient's daughter [name] regarding SNF recommendation, she is in agreement with preference for [facility] as patient was there in the past.   CSW has sent referral pending bed offer at this time.   Expected Discharge Plan: Skilled Nursing Facility Barriers to Discharge: Continued Medical Work up   Patient Goals and CMS Choice   CMS Medicare.gov Compare Post Acute Care list provided to:: Patient Represenative (must comment) (daughter [name]) Choice offered to / list presented to : Adult Children  Expected Discharge Plan and Services Expected Discharge Plan: [place] Acute Care Choice: [place] Living arrangements for the past 2 months: Single Family Home                                      Prior Living Arrangements/Services Living arrangements for the past 2 months: Single Family Home Lives with:: Self Patient language and need for interpreter reviewed:: Yes Do you feel safe going back to the place where you live?: No   needs short term rehab  Need for Family Participation in Patient Care: Yes (Comment) Care giver support system in place?: Yes (comment)   Criminal Activity/Legal Involvement Pertinent to Current Situation/Hospitalization: No - Comment as needed  Activities of Daily Living Home Assistive Devices/Equipment: Cane (specify quad or straight) ADL Screening (condition at time of admission) Patient's cognitive ability adequate to safely complete daily activities?: No Is the patient deaf or have difficulty hearing?: No Does the patient have difficulty seeing, even when wearing glasses/contacts?: No Does the patient  have difficulty concentrating, remembering, or making decisions?: Yes Patient able to express need for assistance with ADLs?: Yes Does the patient have difficulty dressing or bathing?: Yes Independently performs ADLs?: No Does the patient have difficulty walking or climbing stairs?: Yes Weakness of Legs: None Weakness of Arms/Hands: None  Permission Sought/Granted Permission sought to share information with : Case Manager, Customer service manager, Family Supports Permission granted to share information with : Yes, Verbal Permission Granted  Share Information with NAME: [name]  Permission granted to share info w AGENCY: SNFs  Permission granted to share info w Relationship: daughter  Permission granted to share info w Contact Information: [phone]  Emotional Assessment              Admission diagnosis:  CHF exacerbation ([place]) [I50.9] Troponin I above reference range [R77.8] Acute on chronic congestive heart failure, unspecified heart failure type ([place]) [I50.9] Acute exacerbation of CHF (congestive heart failure) ([place]) [I50.9] Patient Active Problem List   Diagnosis Date Noted   CHF exacerbation ([place]) 04/19/2021   Colitis 04/05/2021   Acute colitis 04/04/2021   Acute renal failure superimposed on stage 3b chronic kidney disease ([place]) 04/04/2021   HTN (hypertension) 04/04/2021   Atrial fibrillation, chronic ([place]) 04/04/2021   Chronic diastolic CHF (congestive heart failure) ([place]) 04/04/2021   Rectal bleeding 04/04/2021   COPD (chronic obstructive pulmonary disease) ([place]) 04/04/2021   HLD (hyperlipidemia) 04/04/2021   Hypothyroid 04/04/2021   Cirrhosis ([place]) 04/04/2021   Breast  cancer ([place]) 04/04/2021   Dementia ([place]) 04/04/2021   Essential hypertension 03/14/2020   Hyperlipidemia 03/14/2020   Acute ischemic stroke ([place]) L MCA s/p mechanical thrombectomy, embolic d/t AF not on [facility] 03/10/2020   Osteopenia 01/21/2020   Family history of breast cancer    Family  history of multiple myeloma    Family history of esophageal cancer    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/2018   MGUS (monoclonal gammopathy of unknown significance) 12/14/2017   Carcinoma of overlapping sites of right breast in female, estrogen receptor positive ([place]) 06/16/2016   Acute exacerbation of CHF (congestive heart failure) ([place]) 06/07/2016   A-fib ([place]) 04/08/2016   COPD exacerbation ([place]) 03/22/2016   Acute bronchitis 03/22/2016   Atrial fibrillation with RVR ([place]) 03/22/2016   Chronic renal insufficiency 03/22/2016   Cough with expectoration 06/04/2015   Essential thrombocytosis ([place]) 02/01/2015   Anemia 02/01/2015   Atrial flutter ([place]) 01/23/2015   Atrial fibrillation ([place]) 01/23/2015   Other long term (current) drug therapy 11/06/2014   Chronic diastolic heart failure ([place]) 08/01/2014   Long term current use of anticoagulant 06/06/2014   Anxiety 05/13/2014   Chronic obstructive pulmonary disease ([place]) 03/22/2014   Primary biliary cholangitis ([place]) 02/19/2014   Avitaminosis D 02/19/2014   PCP:  [name], MD (Inactive) Pharmacy:   [facility], [address] [place] [address] Phone: [phone] Fax: [phone]     Social Determinants of Health (SDOH) Interventions    Readmission Risk Interventions Readmission Risk Prevention Plan 04/06/2021  Transportation Screening Complete  PCP or Specialist Appt within 3-5 Days Complete  HRI or Home Care Consult Complete  Social Work Consult for [place] Planning/Counseling Complete  Palliative Care Screening Complete  Medication Review Press photographer) Complete  Some recent data might be hidden

## 2021-04-22 NOTE — Progress Notes (Signed)
   Heart Failure Nurse Navigator Note   Attempted to meet with patient today to discuss heart failure.  She is lying quietly in bed in no acute distress.  There is no family currently at the bedside.  She states that she is just not remembering, I told her that I would give her other day and check in to see how she is doing tomorrow.  She voices understanding.   Pricilla Riffle RN CHFN

## 2021-04-22 NOTE — NC FL2 (Signed)
[place] LEVEL OF CARE SCREENING TOOL     IDENTIFICATION  [name] Birthdate: November 21, 1945 Sex: female Admission Date (Current Location): 04/19/2021  [facility] and [place] Number:  Engineering geologist and Address:  [facility], [address], [place], [place] [address]      Provider Number: [id number]  Attending Physician Name and Address:  Loletha Grayer, MD  Relative Name and Phone Number:  [name] (daughter) [phone]    Current Level of Care: Hospital Recommended Level of Care: [place] Prior Approval Number:    Date Approved/Denied:   PASRR Number: [id number]  Discharge Plan: SNF    Current Diagnoses: Patient Active Problem List   Diagnosis Date Noted   CHF exacerbation ([place]) 04/19/2021   Colitis 04/05/2021   Acute colitis 04/04/2021   Acute renal failure superimposed on stage 3b chronic kidney disease ([place]) 04/04/2021   HTN (hypertension) 04/04/2021   Atrial fibrillation, chronic (HCC) 04/04/2021   Chronic diastolic CHF (congestive heart failure) ([place]) 04/04/2021   Rectal bleeding 04/04/2021   COPD (chronic obstructive pulmonary disease) ([place]) 04/04/2021   HLD (hyperlipidemia) 04/04/2021   Hypothyroid 04/04/2021   Cirrhosis ([place]) 04/04/2021   Breast cancer ([place]) 04/04/2021   Dementia ([place]) 04/04/2021   Essential hypertension 03/14/2020   Hyperlipidemia 03/14/2020   Acute ischemic stroke ([place]) L MCA s/p mechanical thrombectomy, embolic d/t AF not on [facility] 03/10/2020   Osteopenia 01/21/2020   Family history of breast cancer    Family history of multiple myeloma    Family history of esophageal cancer    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/2018   MGUS (monoclonal gammopathy of unknown significance) 12/14/2017   Carcinoma of overlapping sites of right breast in female, estrogen receptor positive ([place]) 06/16/2016   Acute exacerbation of CHF (congestive heart failure) ([place])  06/07/2016   A-fib ([place]) 04/08/2016   COPD exacerbation ([place]) 03/22/2016   Acute bronchitis 03/22/2016   Atrial fibrillation with RVR ([place]) 03/22/2016   Chronic renal insufficiency 03/22/2016   Cough with expectoration 06/04/2015   Essential thrombocytosis ([place]) 02/01/2015   Anemia 02/01/2015   Atrial flutter ([place]) 01/23/2015   Atrial fibrillation ([place]) 01/23/2015   Other long term (current) drug therapy 11/06/2014   Chronic diastolic heart failure ([place]) 08/01/2014   Long term current use of anticoagulant 06/06/2014   Anxiety 05/13/2014   Chronic obstructive pulmonary disease ([place]) 03/22/2014   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   Avitaminosis D 02/19/2014    Orientation RESPIRATION BLADDER Height & Weight     Self, Place  Normal Incontinent, External catheter Weight: 172 lb (78 kg) Height:  5' 2"  (157.5 cm)  BEHAVIORAL SYMPTOMS/MOOD NEUROLOGICAL BOWEL NUTRITION STATUS      Continent Diet (see discharge summary)  AMBULATORY STATUS COMMUNICATION OF NEEDS Skin   Extensive Assist Verbally Normal                       Personal Care Assistance Level of Assistance  Bathing, Feeding, Dressing, Total care Bathing Assistance: Limited assistance Feeding assistance: Limited assistance Dressing Assistance: Limited assistance Total Care Assistance: Maximum assistance   Functional Limitations Info  Sight, Hearing, Speech Sight Info: Adequate Hearing Info: Adequate Speech Info: Adequate    SPECIAL CARE FACTORS FREQUENCY  PT (By licensed PT), OT (By licensed OT)     PT Frequency: min 4x weekly OT Frequency: min 4x weekly            Contractures Contractures Info: Not present  Additional Factors Info  Code Status, Allergies Code Status Info: full Allergies Info: Atorvastatin   Calcium   Calcium Carbonate   Fluoxetine   Hydrochlorothiazide           Current Medications (04/22/2021):  This is the current hospital active medication list Current  Facility-Administered Medications  Medication Dose Route Frequency Provider Last Rate Last Admin   0.9 %  sodium chloride infusion  250 mL Intravenous Continuous Jacky Kindle, MD   Stopped at 04/20/21 1336   acetaminophen (TYLENOL) tablet 650 mg  650 mg Oral Q6H PRN Marcelyn Bruins, MD   650 mg at 04/21/21 2152   Or   acetaminophen (TYLENOL) suppository 650 mg  650 mg Rectal Q6H PRN Marcelyn Bruins, MD       albuterol (PROVENTIL) (2.5 MG/3ML) 0.083% nebulizer solution 2.5 mg  2.5 mg Nebulization Q4H PRN Renda Rolls, RPH       amiodarone (PACERONE) tablet 400 mg  400 mg Oral BID Teodoro Spray, MD   400 mg at 04/22/21 0810   apixaban (ELIQUIS) tablet 5 mg  5 mg Oral BID Marcelyn Bruins, MD   5 mg at 04/22/21 0810   donepezil (ARICEPT) tablet 5 mg  5 mg Oral QHS Marcelyn Bruins, MD   5 mg at 04/21/21 2152   ezetimibe (ZETIA) tablet 10 mg  10 mg Oral Daily Marcelyn Bruins, MD   10 mg at 04/22/21 0809   fentaNYL (SUBLIMAZE) injection 50 mcg  50 mcg Intravenous Q4H PRN Reubin Milan, MD   50 mcg at 04/22/21 0940   furosemide (LASIX) injection 40 mg  40 mg Intravenous BID Marcelyn Bruins, MD   40 mg at 04/22/21 5498   levothyroxine (SYNTHROID) tablet 50 mcg  50 mcg Oral Q0600 Marcelyn Bruins, MD   50 mcg at 04/22/21 0424   magnesium sulfate IVPB 2 g 50 mL  2 g Intravenous Once Jacky Kindle, MD       mometasone-formoterol (DULERA) 100-5 MCG/ACT inhaler 2 puff  2 puff Inhalation BID Marcelyn Bruins, MD   2 puff at 04/22/21 0812   ondansetron [facility]) injection 4 mg  4 mg Intravenous Q6H PRN Loletha Grayer, MD   4 mg at 04/22/21 0940   pantoprazole (PROTONIX) EC tablet 40 mg  40 mg Oral BID Wieting, Richard, MD       polyethylene glycol (MIRALAX / GLYCOLAX) packet 17 g  17 g Oral Daily PRN Marcelyn Bruins, MD       potassium chloride SA (KLOR-CON) CR tablet 40 mEq  40 mEq Oral Daily Barb Merino, MD   40 mEq at 04/22/21 0810   pravastatin (PRAVACHOL)  tablet 40 mg  40 mg Oral QHS Marcelyn Bruins, MD   40 mg at 04/21/21 2153   predniSONE (DELTASONE) tablet 5 mg  5 mg Oral Q breakfast Marcelyn Bruins, MD   5 mg at 04/22/21 0810   sodium chloride flush (NS) 0.9 % injection 3 mL  3 mL Intravenous Q12H Marcelyn Bruins, MD   3 mL at 04/22/21 2641   tamoxifen (NOLVADEX) tablet 20 mg  20 mg Oral Daily Marcelyn Bruins, MD   20 mg at 04/22/21 0810   ursodiol (ACTIGALL) capsule 600 mg  600 mg Oral BID Marcelyn Bruins, MD   600 mg at 04/22/21 5830   venlafaxine XR (EFFEXOR-XR) 24 hr capsule 37.5 mg  37.5 mg Oral Daily Marcelyn Bruins, MD  37.5 mg at 04/22/21 8979     Discharge Medications: Please see discharge summary for a list of discharge medications.  Relevant Imaging Results:  Relevant Lab Results:   Additional Information SSN: [id number]  Alberteen Sam, LCSW

## 2021-04-22 NOTE — Progress Notes (Signed)
Pt is A&O, VS stable, Vpaced on the monitor. IV infiltrated, IV team came and restarted IV. Foley was in place, but leaked and was D/Cd at 0445 am. Given CHG bath, bed changed. Given fentanylx2 for sternal pain. No other complaints. Had small BM this am

## 2021-04-22 NOTE — Progress Notes (Signed)
[name] Date of Encounter: 04/22/2021  Hospital Problem List     Principal Problem:   Acute exacerbation of CHF (congestive heart failure) ([place]) Active Problems:   Anxiety   Primary biliary cholangitis (HCC)   A-fib (HCC)   Hyperlipidemia   HTN (hypertension)   COPD (chronic obstructive pulmonary disease) ([place])   HLD (hyperlipidemia)   Hypothyroid   Cirrhosis ([place])   Breast cancer ([place])   Dementia ([place])   CHF exacerbation ([place])    Patient Profile        75 y.o. female with history of hypertension, hyperlipidemia, hypothyroidism, history of heart block status post permanent pacemaker, COPD, CKD 3B, atrial fibrillation and heart failure felt to be HFpEF was admitted for the shortness of breath.  She also complained of lower extremity edema.  Home health nurse noted that her pulse ox was 87.  She had a recent admission for colitis and several medications were held due to AKI.  These medications included furosemide, lisinopril and diltiazem..  Echocardiogram done April 05, 2021 showed preserved LV function with an EF of 60 to 65% with left and right atrial enlargement.  Her bioprosthetic aortic valve mean gradient had increased slightly.  However grossly there was no change in the valve prosthesis.  She was admitted and placed on diuresis.  EKG on admission showed probable atrial flutter with ventricular paced rhythm.  Troponin on admission was 47 currently 82 with more pending.  Potassium on admission was 3.0.  Currently 3.4.  Magnesium is 1.9.  Patient developed spontaneous ventricular tachycardia with cyanosis and no pulse.  CPR and ACLS protocol was begun.  ROSC was achieved after 2 minutes and receiving an amiodarone bolus x1 and 1 electrical shock.    Subjective   Continues to clinically improve. Has some chest wall pain but is resting comfortably  Inpatient Medications     amiodarone  400 mg Oral BID   apixaban  5 mg Oral BID   donepezil  5 mg Oral  QHS   ezetimibe  10 mg Oral Daily   furosemide  40 mg Intravenous BID   levothyroxine  50 mcg Oral Q0600   mometasone-formoterol  2 puff Inhalation BID   pantoprazole  40 mg Oral BID   potassium chloride  40 mEq Oral Daily   pravastatin  40 mg Oral QHS   predniSONE  5 mg Oral Q breakfast   sodium chloride flush  3 mL Intravenous Q12H   tamoxifen  20 mg Oral Daily   ursodiol  600 mg Oral BID   venlafaxine XR  37.5 mg Oral Daily    Vital Signs    Vitals:   04/21/21 1412 04/21/21 1853 04/22/21 0452 04/22/21 0745  BP: (!) 147/67 122/83 133/79 (!) 143/67  Pulse: 72 (!) 59 (!) 59 60  Resp: (!) '24 16 16 18  '$ Temp:  97.8 F (36.6 C) 97.9 F (36.6 C) 98 F (36.7 C)  TempSrc:    Oral  SpO2: 96% 96% 91% 96%  Weight:      Height:        Intake/Output Summary (Last 24 hours) at 04/22/2021 1250 Last data filed at 04/22/2021 0424 Gross per 24 hour  Intake 355 ml  Output 600 ml  Net -245 ml   Filed Weights   04/19/21 2023  Weight: 78 kg    Physical Exam    GEN: Well nourished, well developed, in no acute distress.  HEENT: normal.  Neck: Supple, no JVD,  carotid bruits, or masses. Cardiac: irr Respiratory:  Respirations regular and unlabored, clear to auscultation bilaterally. GI: Soft, nontender, nondistended, BS + x 4. MS: no deformity or atrophy. Skin: warm and dry, no rash. Neuro:  Strength and sensation are intact. Psych: Normal affect.  Labs    CBC Recent Labs    04/19/21 2152 04/21/21 0546  WBC 9.6 13.9*  HGB 12.1 10.1*  HCT 39.6 31.3*  MCV 98.0 93.7  PLT 202 [id number]   Basic Metabolic Panel Recent Labs    04/21/21 0546 04/22/21 0715  NA 138 135  K 4.6 4.2  CL 98 98  CO2 28 27  GLUCOSE 115* 101*  BUN 28* 28*  CREATININE 1.42* 1.40*  CALCIUM 8.9 8.6*  MG 2.2 1.9  PHOS 3.8 2.9   Liver Function Tests Recent Labs    04/20/21 0345 04/20/21 1114  AST 117* 181*  ALT 159* 191*  ALKPHOS 60 61  BILITOT 0.9 0.9  PROT 6.7 6.5  ALBUMIN 3.1* 2.9*   No  results for input(s): LIPASE, AMYLASE in the last 72 hours. Cardiac Enzymes No results for input(s): CKTOTAL, CKMB, CKMBINDEX, TROPONINI in the last 72 hours. BNP Recent Labs    04/19/21 2152  BNP 558.3*   D-Dimer No results for input(s): DDIMER in the last 72 hours. Hemoglobin A1C No results for input(s): HGBA1C in the last 72 hours. Fasting Lipid Panel Recent Labs    04/21/21 0546  TRIG 81   Thyroid Function Tests No results for input(s): TSH, T4TOTAL, T3FREE, THYROIDAB in the last 72 hours.  Invalid input(s): FREET3  Telemetry    Afib with v pacing  ECG    afib  Radiology    DG Chest 2 View  Result Date: 04/19/2021 CLINICAL DATA:  Shortness of breath for several days. Hypoxia. EXAM: CHEST - 2 VIEW COMPARISON:  06/08/2016 FINDINGS: Moderate cardiomegaly shows no significant change. 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 A new tiny right pleural effusion is seen. No evidence of acute pulmonary edema or other infiltrate. Calcified granuloma again seen in the right lower lung. Right shoulder prosthesis also noted. IMPRESSION: Stable cardiomegaly. New tiny right pleural effusion. Electronically Signed   By: Marlaine Hind M.D.   On: 04/19/2021 21:10   ECHOCARDIOGRAM COMPLETE  Result Date: 04/05/2021    ECHOCARDIOGRAM REPORT   Patient Name:   Adriana Martin Date of Exam: 04/05/2021 Medical Rec #:  [id number]            Height:       62.0 in Accession #:    [id number]           Weight:       164.0 lb Date of Birth:  1946/06/09            BSA:          1.757 m Patient Age:    76 years             BP:           109/51 mmHg Patient Gender: F                    HR:           67 bpm. Exam Location:  [facility] Procedure: 2D Echo Indications:     Atrial Flutter I48.92  History:         Patient has no prior history  of Echocardiogram examinations.  Sonographer:     Kathlen Brunswick RDCS Referring  Phys:  [id number] [place] Diagnosing Phys: Skeet Latch MD IMPRESSIONS  1. Left ventricular ejection fraction, by estimation, is 60 to 65%. The left ventricle has normal function. The left ventricle has no regional wall motion abnormalities. Left ventricular diastolic parameters are indeterminate.  2. Right ventricular systolic function is normal. The right ventricular size is normal. There is normal pulmonary artery systolic pressure.  3. Left atrial size was severely dilated.  4. Right atrial size was severely dilated.  5. Compared with the echo 02/2020, mean bioprosthetic mitral valve gradient has increased from 10 mmHg to 16 mmHg. Visually the valve appears unchanged from 02/2020. Gradients may be elevated in the setting of high flow and sepsis. No increased thickness or vegetations concerning for endocarditis. The mitral valve has been repaired/replaced. No evidence of mitral valve regurgitation. Severe mitral stenosis. Echo findings are consistent with normal structure and function of the mitral valve prosthesis.  6. The aortic valve is tricuspid. Aortic valve regurgitation is trivial. No aortic stenosis is present.  7. The inferior vena cava is normal in size with greater than 50% respiratory variability, suggesting right atrial pressure of 3 mmHg. FINDINGS  Left Ventricle: Left ventricular ejection fraction, by estimation, is 60 to 65%. The left ventricle has normal function. The left ventricle has no regional wall motion abnormalities. The left ventricular internal cavity size was normal in size. There is  no left ventricular hypertrophy. Left ventricular diastolic parameters are indeterminate. Right Ventricle: The right ventricular size is normal. No increase in right ventricular wall thickness. Right ventricular systolic function is normal. There is normal pulmonary artery systolic pressure. The tricuspid regurgitant velocity is 2.35 m/s, and  with an assumed right atrial pressure of 3 mmHg, the  estimated right ventricular systolic pressure is [id number] mmHg. Left Atrium: Left atrial size was severely dilated. Right Atrium: Right atrial size was severely dilated. Pericardium: There is no evidence of pericardial effusion. Mitral Valve: Compared with the echo 02/2020, mean bioprosthetic mitral valve gradient has increased from 10 mmHg to 16 mmHg. Visually the valve appears unchanged from 02/2020. Gradients may be elevated in the setting of high flow and sepsis. No increased thickness or vegetations concerning for endocarditis. The mitral valve has been repaired/replaced. No evidence of mitral valve regurgitation. Echo findings are consistent with normal structure and function of the mitral valve prosthesis. Severe mitral valve stenosis. MV peak gradient, 38.4 mmHg. The mean mitral valve gradient is 16.0 mmHg. Tricuspid Valve: The tricuspid valve is normal in structure. Tricuspid valve regurgitation is trivial. No evidence of tricuspid stenosis. Aortic Valve: The aortic valve is tricuspid. Aortic valve regurgitation is trivial. No aortic stenosis is present. Aortic valve mean gradient measures 6.0 mmHg. Aortic valve peak gradient measures 13.7 mmHg. Aortic valve area, by VTI measures 1.44 cm. Pulmonic Valve: The pulmonic valve was normal in structure. Pulmonic valve regurgitation is not visualized. No evidence of pulmonic stenosis. Aorta: The aortic root is normal in size and structure. Venous: The inferior vena cava is normal in size with greater than 50% respiratory variability, suggesting right atrial pressure of 3 mmHg. IAS/Shunts: No atrial level shunt detected by color flow Doppler.  LEFT VENTRICLE PLAX 2D LVIDd:         4.30 cm  Diastology LVIDs:         3.00 cm  LV e' medial:    4.35 cm/s LV PW:  1.40 cm  LV E/e' medial:  49.4 LV IVS:        1.00 cm  LV e' lateral:   4.68 cm/s LVOT diam:     1.80 cm  LV E/e' lateral: 45.9 LV SV:         43 LV SV Index:   25 LVOT Area:     2.54 cm  LEFT ATRIUM            Index       RIGHT ATRIUM           Index LA diam:      4.80 cm 2.73 cm/m  RA Area:     23.50 cm LA Vol (A2C): 32.1 ml 18.27 ml/m RA Volume:   77.90 ml  44.33 ml/m LA Vol (A4C): 71.0 ml 40.41 ml/m  AORTIC VALVE                    PULMONIC VALVE AV Area (Vmax):    1.22 cm     PV Vmax:       0.91 m/s AV Area (Vmean):   1.28 cm     PV Peak grad:  3.3 mmHg AV Area (VTI):     1.44 cm AV Vmax:           185.00 cm/s AV Vmean:          114.000 cm/s AV VTI:            0.300 m AV Peak Grad:      13.7 mmHg AV Mean Grad:      6.0 mmHg LVOT Vmax:         88.70 cm/s LVOT Vmean:        57.200 cm/s LVOT VTI:          0.170 m LVOT/AV VTI ratio: 0.57  AORTA Ao Root diam: 2.90 cm Ao Asc diam:  3.20 cm MITRAL VALVE                TRICUSPID VALVE MV Area (PHT): 0.96 cm     TV Peak grad:   25.8 mmHg MV Area VTI:   0.32 cm     TV Vmax:        2.54 m/s MV Peak grad:  38.4 mmHg    TR Peak grad:   22.1 mmHg MV Mean grad:  16.0 mmHg    TR Vmax:        235.00 cm/s MV Vmax:       3.10 m/s MV Vmean:      184.0 cm/s   SHUNTS MV Decel Time: 789 msec     Systemic VTI:  0.17 m MV E velocity: 215.00 cm/s  Systemic Diam: 1.80 cm Skeet Latch MD Electronically signed by Skeet Latch MD Signature Date/Time: 04/05/2021/1:33:45 PM    Final    CT Angio Abd/Pel w/ and/or w/o  Result Date: 04/04/2021 CLINICAL DATA:  Rectal bleeding, Dementia, recent fall EXAM: CTA ABDOMEN AND PELVIS WITHOUT AND WITH CONTRAST TECHNIQUE: Multidetector CT imaging of the abdomen and pelvis was performed using the standard protocol during bolus administration of intravenous contrast. Multiplanar reconstructed images and MIPs were obtained and reviewed to evaluate the vascular anatomy. CONTRAST:  63m OMNIPAQUE IOHEXOL 350 MG/ML SOLN COMPARISON:  03/20/2013 FINDINGS: VASCULAR Aorta: Moderate calcified atheromatous plaque. No aneurysm, dissection, or stenosis. Celiac: Calcified ostial plaque without stenosis, patent distally within remarkable branch anatomy.  SMA: Ostial plaque extending over length of approximately 1.6 cm resulting in moderate stenosis.  Vessels patent distally with classic branch anatomy. Renals: Single left, with proximal branching. No stenosis. Duplicated right, superior dominant, both patent. IMA: Patent without evidence of aneurysm, dissection, vasculitis or significant stenosis. Inflow: Mild plaque in bilateral common iliac arteries without stenosis. No dissection or aneurysm. Proximal Outflow: Bilateral common femoral and visualized portions of the superficial and profunda femoral arteries are patent without evidence of aneurysm, dissection, vasculitis or significant stenosis. Veins: Patent hepatic veins, portal vein, SMV, splenic vein, bilateral renal veins. Scattered mural calcifications in right common femoral and common iliac veins suggesting sequela of previous central venous catheter or DVT. IVC is nondistended, unremarkable. Review of the MIP images confirms the above findings. NON-VASCULAR Lower chest: Cardiomegaly with biatrial enlargement. Transvenous pacing leads partially visualized. Mitral valve replacement. No pleural or pericardial effusion. Hepatobiliary: No focal liver abnormality is seen. No gallstones, gallbladder wall thickening, or biliary dilatation. Pancreas: Unremarkable. No pancreatic ductal dilatation or surrounding inflammatory changes. Spleen: Normal in size without focal abnormality. Adrenals/Urinary Tract: Adrenal glands unremarkable. Symmetric renal enhancement. No hydronephrosis or urolithiasis. Urinary bladder incompletely distended. Stomach/Bowel: Stomach and small bowel are nondistended. Appendix not identified. No pericecal inflammatory change. The colon is nondilated. Suspect mild circumferential wall thickening in the splenic flexure with mild adjacent inflammatory change. Scattered sigmoid diverticula without adjacent inflammatory change. No evidence of active extravasation. Lymphatic: No abdominal or pelvic  adenopathy. Reproductive: Status post hysterectomy. No adnexal masses. Other: Bilateral pelvic phleboliths.  No ascites.  No free air. Musculoskeletal: Chronic T8 and L1 vertebral body compression deformities stable since 07/27/2019. Stable grade 1 anterolisthesis L4-5 without pars defect. No acute fracture or aggressive bone lesion. IMPRESSION: 1. No evidence of active GI bleeding. 2. Wall thickening in the splenic flexure of the colon suggesting colitis, nonspecific. Consider GI consultation. 3. Sigmoid diverticulosis Electronically Signed   By: Lucrezia Europe M.D.   On: 04/04/2021 12:02    Assessment & Plan     75 year old female with HFpEF admitted with progressive shortness of breath and edema.  Is of note she was taken off of her Cardizem, lisinopril and diuretics after recent admission.  She had not resumed these.  She presented with shortness of breath felt to be volume overloaded.  Today developed what appeared to be ventricular tachycardia.  ROSC was returned with 1 shock and a bolus of amiodarone.   1.  Ventricular tachycardia etiology unclear.  Patient was mildly hypokalemic on presentation.  EF is normal.  We will continue with amiodarone 400 bid  Troponin is elevated post VT arrest and defibrillation.  Unclear whether ischemia is playing a role but the elevated troponin is not an expected postarrest.  Will convert to p.o. amiodarone and maintain potassium greater than 4 magnesium greater than 2.  We will consider functional study when stable.  Patient is alert and oriented and hemodynamically stable.  2.  Atrial fibrillation-currently ventricular paced.  We will continue with amiodarone as per above. .  We will continue with Eliquis and follow renal function.   3.  Respiratory failure-status postarrest.  Wean vent as tolerated.  Has history of COPD.  Will follow.    4.  Hypotension-has resolved.  Signed, Javier Docker Merlyn Bollen MD 04/22/2021, 12:50 PM  Pager: [phone]

## 2021-04-22 NOTE — Progress Notes (Signed)
Physical Therapy Treatment Patient Details Name: Adriana Martin MRN: [id number] DOB: 12/16/1945 Today's Date: 04/22/2021    History of Present Illness Pt is a 75 y/o F admitted on 04/19/21 with c/o SOB. Pt was admitted for CHF exacerbation. On 04/20/21 pt had v tach cardiac arrest & CPR & intubation with transfer to ICU. Pt extubated later that day. PMH: HTN, HLD, hypothyroidism, hx of heart block s/p permanent pace maker, COPD, CKD3B, a-fib, heart failure, breast CA, cirrhosis, dementia    PT Comments    Pt received supine in bed, daughter preping room for pt to take a nap. PT offered to return later in the afternoon however both were agreeable to therapy at this time. Bed mobility was performed with MOD A to lift trunk to sitting and manage BLE back to bed. STS and standing balance required CGA to steady. She performed 3 stands - first stand static with fair endurance (2 minutes), second stand with marching and third with lateral stepping. By final stand, pt reported fatigue and increased pain and requested to return to supine. PT notified RN of pain. Pt was positioned comfortably for nap. Daughter remained in room. Both educated on placing pillow over chest while coughing to attempt to control pain resulting from broken ribs during CPR. Would benefit from skilled PT to address above deficits and promote optimal return to PLOF.   Follow Up Recommendations  SNF;Supervision/Assistance - 24 hour     Equipment Recommendations   (TBD in next venue)    Recommendations for Other Services       Precautions / Restrictions Precautions Precautions: Fall Restrictions Weight Bearing Restrictions: No    Mobility  Bed Mobility Overal bed mobility: Needs Assistance Bed Mobility: Supine to Sit;Sit to Supine     Supine to sit: HOB elevated;Mod assist Sit to supine: Mod assist;+2 for physical assistance   General bed mobility comments: use of bed rails, MOD A to lift trunk to sitting; MOD A  to manage BLE back to bed    Transfers Overall transfer level: Needs assistance Equipment used: Rolling walker (2 wheeled) Transfers: Sit to/from Stand Sit to Stand: Min guard         General transfer comment: STS x3 reps using RW. Elevated surface for first 2 reps, fully lowered bed for last rep. CGA to steady.  Ambulation/Gait Ambulation/Gait assistance: Min guard Gait Distance (Feet): 2 Feet Assistive device: Rolling walker (2 wheeled)       General Gait Details: Lateral stepping towards HOB. CGA to steady - pt managed RW and steps. VC for sequencing.   Stairs             Wheelchair Mobility    Modified Rankin (Stroke Patients Only)       Balance Overall balance assessment: Needs assistance Sitting-balance support: Bilateral upper extremity supported;Feet supported Sitting balance-Leahy Scale: Good     Standing balance support: During functional activity;Bilateral upper extremity supported Standing balance-Leahy Scale: Fair Standing balance comment: requires BUE support on RW, CGA for additional steadying. First stand was for 2 minutes; 2nd stand for 20 marches; 3rd stand lateral stepping.                            Cognition Arousal/Alertness: Awake/alert Behavior During Therapy: WFL for tasks assessed/performed Overall Cognitive Status: History of cognitive impairments - at baseline  Following Commands: Follows one step commands consistently Safety/Judgement: Decreased awareness of deficits;Decreased awareness of safety   Problem Solving: Slow processing        Exercises General Exercises - Lower Extremity Hip Flexion/Marching: AROM;Both;20 reps;Standing (alternating with RW)    General Comments        Pertinent Vitals/Pain Pain Assessment: 0-10 Pain Score: 3  Pain Location: chest (soreness 2/2 CPR) Pain Descriptors / Indicators: Sore Pain Intervention(s): Monitored during session;Limited  activity within patient's tolerance;Patient requesting pain meds-RN notified    Home Living                      Prior Function            PT Goals (current goals can now be found in the care plan section) Acute Rehab PT Goals Patient Stated Goal: get better    Frequency    Min 2X/week      PT Plan      Co-evaluation              AM-PAC PT "6 Clicks" Mobility   Outcome Measure  Help needed turning from your back to your side while in a flat bed without using bedrails?: A Little Help needed moving from lying on your back to sitting on the side of a flat bed without using bedrails?: A Little Help needed moving to and from a bed to a chair (including a wheelchair)?: A Little Help needed standing up from a chair using your arms (e.g., wheelchair or bedside chair)?: A Little Help needed to walk in hospital room?: A Lot Help needed climbing 3-5 steps with a railing? : A Lot 6 Click Score: 16    End of Session Equipment Utilized During Treatment: Gait belt Activity Tolerance: Patient limited by fatigue;Patient limited by pain Patient left: in bed;with call bell/phone within reach;with family/visitor present;with bed alarm set Nurse Communication: Mobility status PT Visit Diagnosis: Difficulty in walking, not elsewhere classified (R26.2);Muscle weakness (generalized) (M62.81);Unsteadiness on feet (R26.81)     Time: [id number] PT Time Calculation (min) (ACUTE ONLY): 24 min  Charges:  $Therapeutic Activity: 8-22 mins $Neuromuscular Re-education: 8-22 mins                     Adriana Martin PT, DPT 04/22/21 6:03 PM [id number]    Adriana Martin 04/22/2021, 5:59 PM

## 2021-04-23 DIAGNOSIS — J9601 Acute respiratory failure with hypoxia: Secondary | ICD-10-CM

## 2021-04-23 DIAGNOSIS — N189 Chronic kidney disease, unspecified: Secondary | ICD-10-CM

## 2021-04-23 DIAGNOSIS — I5033 Acute on chronic diastolic (congestive) heart failure: Secondary | ICD-10-CM | POA: Diagnosis not present

## 2021-04-23 DIAGNOSIS — N179 Acute kidney failure, unspecified: Secondary | ICD-10-CM

## 2021-04-23 DIAGNOSIS — I469 Cardiac arrest, cause unspecified: Secondary | ICD-10-CM | POA: Diagnosis not present

## 2021-04-23 DIAGNOSIS — R531 Weakness: Secondary | ICD-10-CM

## 2021-04-23 DIAGNOSIS — I482 Chronic atrial fibrillation, unspecified: Secondary | ICD-10-CM | POA: Diagnosis not present

## 2021-04-23 LAB — BASIC METABOLIC PANEL
Anion gap: 8 (ref 5–15)
BUN: 39 mg/dL — ABNORMAL HIGH (ref 8–23)
CO2: 30 mmol/L (ref 22–32)
Calcium: 8.9 mg/dL (ref 8.9–10.3)
Chloride: 93 mmol/L — ABNORMAL LOW (ref 98–111)
Creatinine, Ser: 1.76 mg/dL — ABNORMAL HIGH (ref 0.44–1.00)
GFR, Estimated: 30 mL/min — ABNORMAL LOW (ref >60)
Glucose, Bld: 98 mg/dL (ref 70–99)
Potassium: 4 mmol/L (ref 3.5–5.1)
Sodium: 131 mmol/L — ABNORMAL LOW (ref 135–145)

## 2021-04-23 LAB — PHOSPHORUS: Phosphorus: 4 mg/dL (ref 2.5–4.6)

## 2021-04-23 LAB — CBC
HCT: 32.1 % — ABNORMAL LOW (ref 36.0–46.0)
Hemoglobin: 10.6 g/dL — ABNORMAL LOW (ref 12.0–15.0)
MCH: 30.5 pg (ref 26.0–34.0)
MCHC: 33 g/dL (ref 30.0–36.0)
MCV: 92.5 fL (ref 80.0–100.0)
Platelets: 232 10*3/uL (ref 150–400)
RBC: 3.47 MIL/uL — ABNORMAL LOW (ref 3.87–5.11)
RDW: 14.6 % (ref 11.5–15.5)
WBC: 11.5 10*3/uL — ABNORMAL HIGH (ref 4.0–10.5)
nRBC: 0 % (ref 0.0–0.2)

## 2021-04-23 LAB — MAGNESIUM: Magnesium: 2 mg/dL (ref 1.7–2.4)

## 2021-04-23 MED ORDER — MELOXICAM 7.5 MG PO TABS
15.0000 mg | ORAL_TABLET | Freq: Every day | ORAL | Status: DC
  Administered 2021-04-23 – 2021-04-28 (×6): 15 mg via ORAL
  Filled 2021-04-23 (×6): qty 2

## 2021-04-23 NOTE — Progress Notes (Signed)
Occupational Therapy Treatment Patient Details Name: Adriana Martin MRN: [id number] DOB: [birth year] Today's Date: 04/23/2021    History of present illness Pt is a 75 y/o F admitted on 04/19/21 with c/o SOB. Pt was admitted for CHF exacerbation. On 04/20/21 pt had v tach cardiac arrest & CPR & intubation with transfer to ICU. Pt extubated later that day. PMH: HTN, HLD, hypothyroidism, hx of heart block s/p permanent pace maker, COPD, CKD3B, a-fib, heart failure, breast CA, cirrhosis, dementia   OT comments  Adriana Martin was seen for OT treatment on this date. Upon arrival to room pt semi-reclined in bed, denies pain, and agreeable to OT tx session. Pt requires increased time/multimodal cueing to sequencing tasks including LB dressing task with AE and bed mobility this date. She is able to complete LB dressing while seated with supervision/cueing and requires MIN A for functional/bed mobility. See ADL section below for additional details.  Pt/caregiver (daughter present at bedside) educated on safe use of AE/DME for ADL management, energy conservation strategies including 4 p's and pursed lip breathing, and falls prevention strategies. Handout provided to support recall/carryover. Pt return verbalizes/demonstrates understanding of PLB technique during session. Pt making good progress toward goals and continues to benefit from skilled OT services to maximize return to PLOF and minimize risk of future falls, injury, caregiver burden, and readmission. Will continue to follow POC. Discharge recommendation remains appropriate.    Follow Up Recommendations  SNF    Equipment Recommendations       Recommendations for Other Services      Precautions / Restrictions Precautions Precautions: Fall Restrictions Weight Bearing Restrictions: No       Mobility Bed Mobility Overal bed mobility: Needs Assistance Bed Mobility: Supine to Sit;Sit to Supine     Supine to sit: HOB elevated;Min assist      General bed mobility comments: use of bed rails, HHA provided to lift trunk. Attempts made to educate pt on log-roll technique however pt is unable to sequence steps despite multimodal cueing.    Transfers Overall transfer level: Needs assistance Equipment used: Rolling walker (2 wheeled) Transfers: Sit to/from Stand Sit to Stand: Min guard Stand pivot transfers: Min assist       General transfer comment: Min A for initial STS, min guard for side-stepping at EOB.    Balance Overall balance assessment: Needs assistance Sitting-balance support: Feet supported;No upper extremity supported Sitting balance-Leahy Scale: Good Sitting balance - Comments: Steady static/dynamic sitting at EOB.   Standing balance support: During functional activity;Bilateral upper extremity supported Standing balance-Leahy Scale: Fair Standing balance comment: requires BUE support on RW, CGA to steady                           ADL either performed or assessed with clinical judgement   ADL Overall ADL's : Needs assistance/impaired                     Lower Body Dressing: Set up;Supervision/safety Lower Body Dressing Details (indicate cue type and reason): Pt able to doff/don bilat hospital socks using LH reacher and limited sucess with sock aid this date. She has difficulty sequencing novel motor task of using sock aid. Pt has LB access to doff/don socks without AE, however would benefit for energy conservation purposes.             Functional mobility during ADLs: Minimal assistance;Rolling walker;Min guard General ADL Comments: MIN A for bed mobility and  initial STS this date. Pt noted with some difficulty wtih motor planning/sequencing. Dtr at bedside states its likely because pt unfamiliar with tasks. Educated on energy conservation and safe use of AE/DME for ADL management.     Vision Patient Visual Report: No change from baseline     Perception     Praxis       Cognition Arousal/Alertness: Awake/alert Behavior During Therapy: WFL for tasks assessed/performed Overall Cognitive Status: History of cognitive impairments - at baseline Area of Impairment: Following commands;Safety/judgement;Problem solving                       Following Commands: Follows multi-step commands with increased time Safety/Judgement: Decreased awareness of safety;Decreased awareness of deficits   Problem Solving: Slow processing;Difficulty sequencing          Exercises Other Exercises Other Exercises: Pt/caregiver (daughter present at bedside) educated on safe use of AE/DME for ADL management, energy conservation strategies including 4 p's and pursed lip breathing, and falls prevention strategies. Handout provided to support recall/carryover. LB Dressing task as described above (See ADL section)    Shoulder Instructions       General Comments VSS t/o session. Pt remains on RA, with SO2 >/=88% during session.    Pertinent Vitals/ Pain       Pain Assessment: No/denies pain Pain Score: 5  Pain Location: chest (soreness 2/2 CPR) Pain Descriptors / Indicators: Sore Pain Intervention(s): Monitored during session (pain appeared to be less activity-limited today)  Home Living                                          Prior Functioning/Environment              Frequency  Min 2X/week        Progress Toward Goals  OT Goals(current goals can now be found in the care plan section)  Progress towards OT goals: Progressing toward goals  Acute Rehab OT Goals Patient Stated Goal: get better OT Goal Formulation: With patient/family Time For Goal Achievement: 05/05/21 Potential to Achieve Goals: [place] Discharge plan remains appropriate;Frequency remains appropriate    Co-evaluation                 AM-PAC OT "6 Clicks" Daily Activity     Outcome Measure   Help from another person eating meals?: A Little Help from  another person taking care of personal grooming?: A Little Help from another person toileting, which includes using toliet, bedpan, or urinal?: A Little Help from another person bathing (including washing, rinsing, drying)?: A Lot Help from another person to put on and taking off regular upper body clothing?: A Little Help from another person to put on and taking off regular lower body clothing?: A Lot 6 Click Score: 16    End of Session Equipment Utilized During Treatment: Rolling walker;Oxygen  OT Visit Diagnosis: Muscle weakness (generalized) (M62.81);Other symptoms and signs involving cognitive function   Activity Tolerance Patient tolerated treatment well   Patient Left in bed;with call bell/phone within reach;with bed alarm set;with nursing/sitter in room;with family/visitor present   Nurse Communication          Time: [id number] OT Time Calculation (min): 31 min  Charges: OT General Charges $OT Visit: 1 Visit OT Treatments $Self Care/Home Management : 23-37 mins  Shara Blazing, M.S., OTR/L Ascom: [phone] 04/23/21, 4:00 PM

## 2021-04-23 NOTE — TOC Progression Note (Signed)
Transition of Care [facility]) - Progression Note    Patient Details  Name: Adriana Martin MRN: [id number] Date of Birth: Dec 17, 1945  Transition of Care [facility]) CM/SW [place], [place] Phone Number: 04/23/2021, 10:12 AM  Clinical Narrative:     CSW spoke with Neoma Laming at [facility] who reports she will review patient's referral this morning and call CSW back if able to make a bed offer.   Expected Discharge Plan: Skilled Nursing Facility Barriers to Discharge: Continued Medical Work up  Expected Discharge Plan and Services Expected Discharge Plan: [place] Choice: [place] arrangements for the past 2 months: Single Family Home                                       Social Determinants of Health (SDOH) Interventions    Readmission Risk Interventions Readmission Risk Prevention Plan 04/06/2021  Transportation Screening Complete  PCP or Specialist Appt within 3-5 Days Complete  HRI or [place] Complete  Social Work Consult for [place] Planning/Counseling Complete  Palliative Care Screening Complete  Medication Review Press photographer) Complete  Some recent data might be hidden

## 2021-04-23 NOTE — Consult Note (Signed)
   Heart Failure Nurse Navigator Note  HFpEF 60-65%.  Normal right ventricular systolic function.  Severe biatrial enlargement.  She presented to the emergency room with complaints of worsening shortness of breath and lower extremity edema..  Comorbidities:  Chronic kidney disease stage III Atrial fibrillation Anxiety Anemia Hypertension Hyperlipidemia Dementia  Medications:  Amiodarone 400 mg 2 times a day Apixaban 5 mg 2 times a day Zetia 10 mg daily Potassium chloride 40 mill equivalents daily Pravastatin 40 mg at bedtime  Labs:  Sodium 131, potassium 4, chloride 93, CO2 30, BUN 34 up from 28 of yesterday and creatinine 1.76 up from 1.4 of yesterday.  Weight 77 kg Intake 240 mL Output 950 mL  Met with patient again today, she is currently sitting up in the chair at bedside and much more talkative.  [name] is also present.  Patient states that she lives by herself has one of her daughters that lives behind her.  This daughter is the one that helps her with the evening meal.  She fixes her own breakfast which consists of yogurt and crackers.  The daughter that is here today states that she usually brings her something from a restaurant from lunch or takes her to [employer].  Discussed making healthy choices with fast foods.  The evening meal is then prepared by the daughter that lives behind her, they do not add salt at the table nor while her cooking.  Discussed weighing daily and what to report.  Also talked about follow-up in outpatient heart failure clinic and this could also be another avenue they felt she was getting into trouble with heart failure or had questions.  She was also given the living with heart failure teaching booklet along with his own magnet and information on taking charge of your heart failure along with low-sodium info.   Pricilla Riffle RN CHFN

## 2021-04-23 NOTE — Care Management Important Message (Signed)
Important Message  Patient Details  Name: Adriana Martin MRN: [id number] Date of Birth: 08-Mar-1946   Medicare Important Message Given:  Yes     Dannette Barbara 04/23/2021, 10:38 AM

## 2021-04-23 NOTE — Progress Notes (Signed)
Patient ID: Adriana Martin, female   DOB: Jan 22, 1946, 75 y.o.   MRN: [id number] [facility] PROGRESS NOTE  Adriana Martin [id number] DOB: [birth year] DOA: 04/19/2021 PCP: [name], MD (Inactive)  HPI/Subjective: Patient stated she ate quite a bit of breakfast.  No epigastric pain today.  Soreness in her upper chest today.  Admitted with ventricular fibrillation cardiac arrest.  Objective: Vitals:   04/23/21 0746 04/23/21 1154  BP: 126/67 121/64  Pulse: 75 67  Resp: 17 16  Temp: (!) 97.5 F (36.4 C) (!) 97.4 F (36.3 C)  SpO2: (!) 83% 92%    Intake/Output Summary (Last 24 hours) at 04/23/2021 1411 Last data filed at 04/23/2021 1300 Gross per 24 hour  Intake 720 ml  Output 950 ml  Net -230 ml   Filed Weights   04/19/21 2023 04/23/21 0116  Weight: 78 kg 77 kg    ROS: Review of Systems  Respiratory:  Negative for shortness of breath.   Cardiovascular:  Positive for chest pain.  Gastrointestinal:  Negative for abdominal pain, nausea and vomiting.  Exam: Physical Exam HENT:     Head: Normocephalic.     Mouth/Throat:     Pharynx: No oropharyngeal exudate.  Eyes:     General: Lids are normal.     Conjunctiva/sclera: Conjunctivae normal.  Cardiovascular:     Rate and Rhythm: Normal rate and regular rhythm.     Heart sounds: Normal heart sounds, S1 normal and S2 normal.  Pulmonary:     Breath sounds: No decreased breath sounds, wheezing, rhonchi or rales.  Abdominal:     Palpations: Abdomen is soft.     Tenderness: There is no abdominal tenderness.  Musculoskeletal:     Right lower leg: Swelling present.     Left lower leg: Swelling present.  Skin:    General: Skin is warm.     Comments: Bruising on arms  Neurological:     Mental Status: She is alert and oriented to person, place, and time.     Data Reviewed: Basic Metabolic Panel: Recent Labs  Lab 04/20/21 0345 04/20/21 1114 04/20/21 1848 04/21/21 0546 04/22/21 0715 04/23/21 0503  NA  137 136  --  138 135 131*  K 3.0* 3.4* 5.1 4.6 4.2 4.0  CL 100 96*  --  98 98 93*  CO2 27 24  --  '28 27 30  '$ GLUCOSE 98 181*  --  115* 101* 98  BUN 22 21  --  28* 28* 39*  CREATININE 1.08* 1.33*  --  1.42* 1.40* 1.76*  CALCIUM 8.9 8.6*  --  8.9 8.6* 8.9  MG  --  1.9 2.2 2.2 1.9 2.0  PHOS  --  4.1  --  3.8 2.9 4.0   Liver Function Tests: Recent Labs  Lab 04/20/21 0345 04/20/21 1114  AST 117* 181*  ALT 159* 191*  ALKPHOS 60 61  BILITOT 0.9 0.9  PROT 6.7 6.5  ALBUMIN 3.1* 2.9*   CBC: Recent Labs  Lab 04/19/21 2152 04/21/21 0546 04/23/21 0503  WBC 9.6 13.9* 11.5*  HGB 12.1 10.1* 10.6*  HCT 39.6 31.3* 32.1*  MCV 98.0 93.7 92.5  PLT 202 191 232   BNP (last 3 results) Recent Labs    04/04/21 1329 04/19/21 2152  BNP 268.0* 558.3*    CBG: Recent Labs  Lab 04/20/21 1034  GLUCAP 180*    Recent Results (from the past 240 hour(s))  Resp Panel by RT-PCR (Flu A&B, Covid) Nasopharyngeal Swab  Status: None   Collection Time: 04/19/21 10:23 PM   Specimen: Nasopharyngeal Swab; Nasopharyngeal(NP) swabs in vial transport medium  Result Value Ref Range Status   SARS Coronavirus 2 by RT PCR NEGATIVE NEGATIVE Final    Comment: (NOTE) SARS-CoV-2 target nucleic acids are NOT DETECTED.  The SARS-CoV-2 RNA is generally detectable in upper respiratory specimens during the acute phase of infection. The lowest concentration of SARS-CoV-2 viral copies this assay can detect is 138 copies/mL. A negative result does not preclude SARS-Cov-2 infection and should not be used as the sole basis for treatment or other patient management decisions. A negative result may occur with  improper specimen collection/handling, submission of specimen other than nasopharyngeal swab, presence of viral mutation(s) within the areas targeted by this assay, and inadequate number of viral copies(<138 copies/mL). A negative result must be combined with clinical observations, patient history, and  epidemiological information. The expected result is Negative.  Fact Sheet for Patients:  EntrepreneurPulse.com.au  Fact Sheet for Healthcare Providers:  IncredibleEmployment.be  This test is no t yet approved or cleared by the [place] FDA and  has been authorized for detection and/or diagnosis of SARS-CoV-2 by FDA under an Emergency Use Authorization (EUA). This EUA will remain  in effect (meaning this test can be used) for the duration of the COVID-19 declaration under Section 564(b)(1) of the Act, 21 U.S.C.section 360bbb-3(b)(1), unless the authorization is terminated  or revoked sooner.       Influenza A by PCR NEGATIVE NEGATIVE Final   Influenza B by PCR NEGATIVE NEGATIVE Final    Comment: (NOTE) The Xpert Xpress SARS-CoV-2/FLU/RSV plus assay is intended as an aid in the diagnosis of influenza from Nasopharyngeal swab specimens and should not be used as a sole basis for treatment. Nasal washings and aspirates are unacceptable for Xpert Xpress SARS-CoV-2/FLU/RSV testing.  Fact Sheet for Patients: EntrepreneurPulse.com.au  Fact Sheet for Healthcare Providers: IncredibleEmployment.be  This test is not yet approved or cleared by the [place] FDA and has been authorized for detection and/or diagnosis of SARS-CoV-2 by FDA under an Emergency Use Authorization (EUA). This EUA will remain in effect (meaning this test can be used) for the duration of the COVID-19 declaration under Section 564(b)(1) of the Act, 21 U.S.C. section 360bbb-3(b)(1), unless the authorization is terminated or revoked.  Performed at [facility], [address] [place], [place] [address]      Scheduled Meds:  amiodarone  400 mg Oral BID   apixaban  5 mg Oral BID   donepezil  5 mg Oral QHS   ezetimibe  10 mg Oral Daily   levothyroxine  50 mcg Oral Q0600   mometasone-formoterol  2 puff Inhalation BID    pantoprazole  40 mg Oral BID   potassium chloride  40 mEq Oral Daily   pravastatin  40 mg Oral QHS   predniSONE  5 mg Oral Q breakfast   sodium chloride flush  3 mL Intravenous Q12H   tamoxifen  20 mg Oral Daily   ursodiol  600 mg Oral BID   venlafaxine XR  37.5 mg Oral Daily    Assessment/Plan:  Ventricular fibrillation cardiac arrest.  Patient has chronic atrial fibrillation.  Patient's is on oral amiodarone and Eliquis at this point.  Patient soreness in the chest likely related to CPR. Chronic atrial fibrillation/flutter.  Patient has a pacemaker.  On amiodarone.  Eliquis for anticoagulation. Acute on chronic diastolic congestive heart failure.  Holding IV Lasix today with increasing creatinine. Acute  kidney injury on chronic disease stage IIIa.  Creatinine up to 1.76 today with diuresis.  Creatinine during the hospital course as low as 1.08.  Holding Lasix today. 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  Physical therapy recommending rehab.  Transitional care team looking into options Acute hypoxic respiratory failure.  Pulse ox 83% on room air.  Oxygen for pulse ox less than 88%.  Patient's daughter states that pulse ox at home was 87% at times also.  May end up needing oxygen at home.  Check pulse ox with ambulation tomorrow        Code Status:     Code Status Orders  (From admission, onward)           Start     Ordered   04/19/21 2356  Full code  Continuous        04/19/21 2356           Code Status History     Date Active Date Inactive Code Status Order ID Comments User Context   04/04/2021 1329 04/07/2021 2301 Full Code [id number]  Ivor Costa, MD ED   03/10/2020 1753 03/15/2020 2314 Full Code [id number]  Amie Portland, MD Inpatient   01/07/2018 1305 01/08/2018 1741 Full Code  [id number]  Leim Fabry, MD Inpatient   06/07/2016 0710 06/07/2016 0937 Full Code [id number]  Mikael Spray, NP ED   03/22/2016 1232 03/24/2016 1432 Full Code [id number]  Theodoro Grist, MD Inpatient   05/02/2015 1443 05/03/2015 2003 Full Code [id number]  Vaughan Basta, MD Inpatient      Advance Directive Documentation    Flowsheet Row Most Recent Value  Type of Advance Directive Healthcare Power of Attorney, Living will  Pre-existing out of facility DNR order (yellow form or pink MOST form) --  "MOST" Form in Place? --      Family Communication: Spoke with daughter on the phone Disposition Plan: Status is: Inpatient  Dispo: The patient is from: Home              Anticipated d/c is to: Rehab              Patient currently doing better than yesterday with regards to her pain.  Creatinine increased today.  Giving Lasix holiday.  Recheck creatinine tomorrow.  Transitional care with looking into rehab options.   Difficult to place patient.  No.  Consultants: Cardiology  Time spent: 27 minutes  [place]

## 2021-04-23 NOTE — Progress Notes (Signed)
Pt alert and oriented to self and with intermittent confusion throughout the day but easily reoriented. Pt O2 sats running in upper 80's earlier this afternoon, placed on 2L and sats improved to 97%. Pt up to chair twice today. Ambulated well with walker. Pt currently back in the bed. Pt ate very little dinner, approx 25%. Pt stated she was "still full" from breakfast. Pt complained of sternal pain this afternoon, PRN '5mg'$  oxycodone given. Will continue to monitor.

## 2021-04-23 NOTE — Progress Notes (Signed)
[name] Date of Encounter: 04/23/2021  Hospital Problem List     Principal Problem:   Acute exacerbation of CHF (congestive heart failure) ([place]) Active Problems:   Anxiety   Primary biliary cholangitis (HCC)   A-fib (HCC)   Hyperlipidemia   Acute kidney injury superimposed on CKD ([place])   HTN (hypertension)   COPD (chronic obstructive pulmonary disease) ([place])   HLD (hyperlipidemia)   Hypothyroid   Cirrhosis (HCC)   Breast cancer (HCC)   Dementia ([place])   CHF exacerbation ([place])   Cardiac arrest ([place])   Epigastric pain   Weakness   Acute respiratory failure with hypoxia [facility])    Patient Profile       75 y.o. female with history of hypertension, hyperlipidemia, hypothyroidism, history of heart block status post permanent pacemaker, COPD, CKD 3B, atrial fibrillation and heart failure felt to be HFpEF was admitted for the shortness of breath.  She also complained of lower extremity edema.  Home health nurse noted that her pulse ox was 87.  She had a recent admission for colitis and several medications were held due to AKI.  These medications included furosemide, lisinopril and diltiazem..  Echocardiogram done April 05, 2021 showed preserved LV function with an EF of 60 to 65% with left and right atrial enlargement.  Her bioprosthetic aortic valve mean gradient had increased slightly.  However grossly there was no change in the valve prosthesis.  She was admitted and placed on diuresis.  EKG on admission showed probable atrial flutter with ventricular paced rhythm.  Troponin on admission was 47 currently 82 with more pending.  Potassium on admission was 3.0.  Currently 3.4.  Magnesium is 1.9.  Patient developed spontaneous ventricular tachycardia with cyanosis and no pulse.  CPR and ACLS protocol was begun.  ROSC was achieved after 2 minutes and receiving an amiodarone bolus x1 and 1 electrical shock.  Patient was intubated and transferred to the ICU for management.     Subjective   Chest wall pain  Inpatient Medications     amiodarone  400 mg Oral BID   apixaban  5 mg Oral BID   donepezil  5 mg Oral QHS   ezetimibe  10 mg Oral Daily   levothyroxine  50 mcg Oral Q0600   meloxicam  15 mg Oral Daily   mometasone-formoterol  2 puff Inhalation BID   pantoprazole  40 mg Oral BID   potassium chloride  40 mEq Oral Daily   pravastatin  40 mg Oral QHS   predniSONE  5 mg Oral Q breakfast   sodium chloride flush  3 mL Intravenous Q12H   tamoxifen  20 mg Oral Daily   ursodiol  600 mg Oral BID   venlafaxine XR  37.5 mg Oral Daily    Vital Signs    Vitals:   04/23/21 0746 04/23/21 1154 04/23/21 1640 04/23/21 1650  BP: 126/67 121/64 140/64   Pulse: 75 67 62 81  Resp: '17 16 17   '$ Temp: (!) 97.5 F (36.4 C) (!) 97.4 F (36.3 C) 97.6 F (36.4 C)   TempSrc: Oral Oral    SpO2: (!) 83% 92% 98% 97%  Weight:      Height:        Intake/Output Summary (Last 24 hours) at 04/23/2021 1656 Last data filed at 04/23/2021 1655 Gross per 24 hour  Intake 720 ml  Output 1800 ml  Net -1080 ml   Filed Weights   04/19/21 2023 04/23/21 0116  Weight: 78 kg 77 kg    Physical Exam    GEN: Well nourished, well developed, in no acute distress.  HEENT: normal.  Neck: Supple, no JVD, carotid bruits, or masses. Cardiac: RRR, no murmurs, rubs, or gallops. No clubbing, cyanosis, edema.  Radials/DP/PT 2+ and equal bilaterally.  Respiratory:  Respirations regular and unlabored, clear to auscultation bilaterally. GI: Soft, nontender, nondistended, BS + x 4. MS: no deformity or atrophy. Skin: warm and dry, no rash. Neuro:  Strength and sensation are intact. Psych: Normal affect.  Labs    CBC Recent Labs    04/21/21 0546 04/23/21 0503  WBC 13.9* 11.5*  HGB 10.1* 10.6*  HCT 31.3* 32.1*  MCV 93.7 92.5  PLT 191 [id number]   Basic Metabolic Panel Recent Labs    04/22/21 0715 04/23/21 0503  NA 135 131*  K 4.2 4.0  CL 98 93*  CO2 27 30  GLUCOSE 101* 98  BUN 28*  39*  CREATININE 1.40* 1.76*  CALCIUM 8.6* 8.9  MG 1.9 2.0  PHOS 2.9 4.0   Liver Function Tests No results for input(s): AST, ALT, ALKPHOS, BILITOT, PROT, ALBUMIN in the last 72 hours. No results for input(s): LIPASE, AMYLASE in the last 72 hours. Cardiac Enzymes No results for input(s): CKTOTAL, CKMB, CKMBINDEX, TROPONINI in the last 72 hours. BNP No results for input(s): BNP in the last 72 hours. D-Dimer No results for input(s): DDIMER in the last 72 hours. Hemoglobin A1C No results for input(s): HGBA1C in the last 72 hours. Fasting Lipid Panel Recent Labs    04/21/21 0546  TRIG 81   Thyroid Function Tests No results for input(s): TSH, T4TOTAL, T3FREE, THYROIDAB in the last 72 hours.  Invalid input(s): FREET3  Telemetry    V paced  ECG    V paced  Radiology    DG Chest 2 View  Result Date: 04/19/2021 CLINICAL DATA:  Shortness of breath for several days. Hypoxia. EXAM: CHEST - 2 VIEW COMPARISON:  06/08/2016 FINDINGS: Moderate cardiomegaly shows no significant change. 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 A new tiny right pleural effusion is seen. No evidence of acute pulmonary edema or other infiltrate. Calcified granuloma again seen in the right lower lung. Right shoulder prosthesis also noted. IMPRESSION: Stable cardiomegaly. New tiny right pleural effusion. Electronically Signed   By: Adriana Martin M.D.   On: 04/19/2021 21:10   ECHOCARDIOGRAM COMPLETE  Result Date: 04/05/2021    ECHOCARDIOGRAM REPORT   Patient Name:   Adriana Martin Date of Exam: 04/05/2021 Medical Rec #:  [id number]            Height:       62.0 in Accession #:    [id number]           Weight:       164.0 lb Date of Birth:  Feb 03, 1946            BSA:          1.757 m Patient Age:    75 years             BP:           109/51 mmHg Patient Gender: F                    HR:           67 bpm. Exam Location:   [facility] Procedure: 2D  Echo Indications:     Atrial Flutter I48.92  History:         Patient has no prior history of Echocardiogram examinations.  Sonographer:     Kathlen Brunswick RDCS Referring Phys:  [id number] [place] Diagnosing Phys: Skeet Latch MD IMPRESSIONS  1. Left ventricular ejection fraction, by estimation, is 60 to 65%. The left ventricle has normal function. The left ventricle has no regional wall motion abnormalities. Left ventricular diastolic parameters are indeterminate.  2. Right ventricular systolic function is normal. The right ventricular size is normal. There is normal pulmonary artery systolic pressure.  3. Left atrial size was severely dilated.  4. Right atrial size was severely dilated.  5. Compared with the echo 02/2020, mean bioprosthetic mitral valve gradient has increased from 10 mmHg to 16 mmHg. Visually the valve appears unchanged from 02/2020. Gradients may be elevated in the setting of high flow and sepsis. No increased thickness or vegetations concerning for endocarditis. The mitral valve has been repaired/replaced. No evidence of mitral valve regurgitation. Severe mitral stenosis. Echo findings are consistent with normal structure and function of the mitral valve prosthesis.  6. The aortic valve is tricuspid. Aortic valve regurgitation is trivial. No aortic stenosis is present.  7. The inferior vena cava is normal in size with greater than 50% respiratory variability, suggesting right atrial pressure of 3 mmHg. FINDINGS  Left Ventricle: Left ventricular ejection fraction, by estimation, is 60 to 65%. The left ventricle has normal function. The left ventricle has no regional wall motion abnormalities. The left ventricular internal cavity size was normal in size. There is  no left ventricular hypertrophy. Left ventricular diastolic parameters are indeterminate. Right Ventricle: The right ventricular size is normal. No increase in right ventricular wall thickness. Right  ventricular systolic function is normal. There is normal pulmonary artery systolic pressure. The tricuspid regurgitant velocity is 2.35 m/s, and  with an assumed right atrial pressure of 3 mmHg, the estimated right ventricular systolic pressure is [id number] mmHg. Left Atrium: Left atrial size was severely dilated. Right Atrium: Right atrial size was severely dilated. Pericardium: There is no evidence of pericardial effusion. Mitral Valve: Compared with the echo 02/2020, mean bioprosthetic mitral valve gradient has increased from 10 mmHg to 16 mmHg. Visually the valve appears unchanged from 02/2020. Gradients may be elevated in the setting of high flow and sepsis. No increased thickness or vegetations concerning for endocarditis. The mitral valve has been repaired/replaced. No evidence of mitral valve regurgitation. Echo findings are consistent with normal structure and function of the mitral valve prosthesis. Severe mitral valve stenosis. MV peak gradient, 38.4 mmHg. The mean mitral valve gradient is 16.0 mmHg. Tricuspid Valve: The tricuspid valve is normal in structure. Tricuspid valve regurgitation is trivial. No evidence of tricuspid stenosis. Aortic Valve: The aortic valve is tricuspid. Aortic valve regurgitation is trivial. No aortic stenosis is present. Aortic valve mean gradient measures 6.0 mmHg. Aortic valve peak gradient measures 13.7 mmHg. Aortic valve area, by VTI measures 1.44 cm. Pulmonic Valve: The pulmonic valve was normal in structure. Pulmonic valve regurgitation is not visualized. No evidence of pulmonic stenosis. Aorta: The aortic root is normal in size and structure. Venous: The inferior vena cava is normal in size with greater than 50% respiratory variability, suggesting right atrial pressure of 3 mmHg. IAS/Shunts: No atrial level shunt detected by color flow Doppler.  LEFT VENTRICLE PLAX 2D LVIDd:         4.30 cm  Diastology LVIDs:  3.00 cm  LV e' medial:    4.35 cm/s LV PW:         1.40 cm   LV E/e' medial:  49.4 LV IVS:        1.00 cm  LV e' lateral:   4.68 cm/s LVOT diam:     1.80 cm  LV E/e' lateral: 45.9 LV SV:         43 LV SV Index:   25 LVOT Area:     2.54 cm  LEFT ATRIUM           Index       RIGHT ATRIUM           Index LA diam:      4.80 cm 2.73 cm/m  RA Area:     23.50 cm LA Vol (A2C): 32.1 ml 18.27 ml/m RA Volume:   77.90 ml  44.33 ml/m LA Vol (A4C): 71.0 ml 40.41 ml/m  AORTIC VALVE                    PULMONIC VALVE AV Area (Vmax):    1.22 cm     PV Vmax:       0.91 m/s AV Area (Vmean):   1.28 cm     PV Peak grad:  3.3 mmHg AV Area (VTI):     1.44 cm AV Vmax:           185.00 cm/s AV Vmean:          114.000 cm/s AV VTI:            0.300 m AV Peak Grad:      13.7 mmHg AV Mean Grad:      6.0 mmHg LVOT Vmax:         88.70 cm/s LVOT Vmean:        57.200 cm/s LVOT VTI:          0.170 m LVOT/AV VTI ratio: 0.57  AORTA Ao Root diam: 2.90 cm Ao Asc diam:  3.20 cm MITRAL VALVE                TRICUSPID VALVE MV Area (PHT): 0.96 cm     TV Peak grad:   25.8 mmHg MV Area VTI:   0.32 cm     TV Vmax:        2.54 m/s MV Peak grad:  38.4 mmHg    TR Peak grad:   22.1 mmHg MV Mean grad:  16.0 mmHg    TR Vmax:        235.00 cm/s MV Vmax:       3.10 m/s MV Vmean:      184.0 cm/s   SHUNTS MV Decel Time: 789 msec     Systemic VTI:  0.17 m MV E velocity: 215.00 cm/s  Systemic Diam: 1.80 cm Skeet Latch MD Electronically signed by Skeet Latch MD Signature Date/Time: 04/05/2021/1:33:45 PM    Final    CT Angio Abd/Pel w/ and/or w/o  Result Date: 04/04/2021 CLINICAL DATA:  Rectal bleeding, Dementia, recent fall EXAM: CTA ABDOMEN AND PELVIS WITHOUT AND WITH CONTRAST TECHNIQUE: Multidetector CT imaging of the abdomen and pelvis was performed using the standard protocol during bolus administration of intravenous contrast. Multiplanar reconstructed images and MIPs were obtained and reviewed to evaluate the vascular anatomy. CONTRAST:  65m OMNIPAQUE IOHEXOL 350 MG/ML SOLN COMPARISON:  03/20/2013  FINDINGS: VASCULAR Aorta: Moderate calcified atheromatous plaque. No aneurysm, dissection, or stenosis. Celiac: Calcified ostial plaque without  stenosis, patent distally within remarkable branch anatomy. SMA: Ostial plaque extending over length of approximately 1.6 cm resulting in moderate stenosis. Vessels patent distally with classic branch anatomy. Renals: Single left, with proximal branching. No stenosis. Duplicated right, superior dominant, both patent. IMA: Patent without evidence of aneurysm, dissection, vasculitis or significant stenosis. Inflow: Mild plaque in bilateral common iliac arteries without stenosis. No dissection or aneurysm. Proximal Outflow: Bilateral common femoral and visualized portions of the superficial and profunda femoral arteries are patent without evidence of aneurysm, dissection, vasculitis or significant stenosis. Veins: Patent hepatic veins, portal vein, SMV, splenic vein, bilateral renal veins. Scattered mural calcifications in right common femoral and common iliac veins suggesting sequela of previous central venous catheter or DVT. IVC is nondistended, unremarkable. Review of the MIP images confirms the above findings. NON-VASCULAR Lower chest: Cardiomegaly with biatrial enlargement. Transvenous pacing leads partially visualized. Mitral valve replacement. No pleural or pericardial effusion. Hepatobiliary: No focal liver abnormality is seen. No gallstones, gallbladder wall thickening, or biliary dilatation. Pancreas: Unremarkable. No pancreatic ductal dilatation or surrounding inflammatory changes. Spleen: Normal in size without focal abnormality. Adrenals/Urinary Tract: Adrenal glands unremarkable. Symmetric renal enhancement. No hydronephrosis or urolithiasis. Urinary bladder incompletely distended. Stomach/Bowel: Stomach and small bowel are nondistended. Appendix not identified. No pericecal inflammatory change. The colon is nondilated. Suspect mild circumferential wall  thickening in the splenic flexure with mild adjacent inflammatory change. Scattered sigmoid diverticula without adjacent inflammatory change. No evidence of active extravasation. Lymphatic: No abdominal or pelvic adenopathy. Reproductive: Status post hysterectomy. No adnexal masses. Other: Bilateral pelvic phleboliths.  No ascites.  No free air. Musculoskeletal: Chronic T8 and L1 vertebral body compression deformities stable since 07/27/2019. Stable grade 1 anterolisthesis L4-5 without pars defect. No acute fracture or aggressive bone lesion. IMPRESSION: 1. No evidence of active GI bleeding. 2. Wall thickening in the splenic flexure of the colon suggesting colitis, nonspecific. Consider GI consultation. 3. Sigmoid diverticulosis Electronically Signed   By: Lucrezia Europe M.D.   On: 04/04/2021 12:02    Assessment & Plan     75 year old female with HFpEF admitted with progressive shortness of breath and edema.  Is of note she was taken off of her Cardizem, lisinopril and diuretics after recent admission.  She had not resumed these.  She presented with shortness of breath felt to be volume overloaded.  Today developed what appeared to be ventricular tachycardia.  ROSC was returned with 1 shock and a bolus of amiodarone.   1.  Ventricular tachycardia etiology unclear.  Patient was mildly hypokalemic on presentation.  EF is normal.  We will continue with amiodarone drip and follow.  Troponin is elevated post VT arrest and defibrillation.  Patient is alert and oriented and hemodynamically stable.  2.  Atrial fibrillation-currently ventricular paced.  We will continue with amiodarone po. We will continue with Eliquis and follow renal function.   3.  Respiratory failure-improved on 2 liters    4.  Hypotension-has resolved.  5. CP-chest wall pain after cpr.   Signed, Javier Docker Marleah Beever MD 04/23/2021, 4:56 PM  Pager: [phone]

## 2021-04-23 NOTE — Progress Notes (Signed)
PT Cancellation Note  Patient Details Name: Ngaire Gamble MRN: [id number] DOB: [birth year]   Cancelled Treatment:    Reason Eval/Treat Not Completed: Other (comment). Pt received in bed, Fowler's position, completing her breakfast. She asked for PT to return after she completes her breakfast. PT will follow up this afternoon as time permits.   Patrina Levering PT, DPT 04/23/21 10:10 AM [id number]  Ramonita Lab 04/23/2021, 10:04 AM

## 2021-04-23 NOTE — Progress Notes (Signed)
Physical Therapy Treatment Patient Details Name: Adriana Martin MRN: [id number] DOB: [birth year] Today's Date: 04/23/2021    History of Present Illness Pt is a 75 y/o F admitted on 04/19/21 with c/o SOB. Pt was admitted for CHF exacerbation. On 04/20/21 pt had v tach cardiac arrest & CPR & intubation with transfer to ICU. Pt extubated later that day. PMH: HTN, HLD, hypothyroidism, hx of heart block s/p permanent pace maker, COPD, CKD3B, a-fib, heart failure, breast CA, cirrhosis, dementia    PT Comments    Pt received supine in bed, agreeable to therapy. Pain did not appear to affect/limit mobility in today's session. Bed mobility has improved to MIN A requiring HHA to pull trunk to upright. Pt progressed mobility today, demo a stand pivot transfer to [facility] and then ambulating 67f within room. SpO2 remained low throughout session however improved with activity. Ambulation distance limited by low SpO2 reading - 83% at rest and 89% after activity; RN notified. Pt asymptomatic. Daughter appeared at end of session. PT educated pt daughter on pt performance and SpO2. Pt ended session sitting in chair, all needs met, call bell in reach. All questions answered. Would benefit from skilled PT to address above deficits and promote optimal return to PLOF.   Follow Up Recommendations  SNF;Supervision/Assistance - 24 hour     Equipment Recommendations   (TBD in next venue)    Recommendations for Other Services       Precautions / Restrictions Precautions Precautions: Fall Restrictions Weight Bearing Restrictions: No    Mobility  Bed Mobility Overal bed mobility: Needs Assistance Bed Mobility: Supine to Sit;Sit to Supine     Supine to sit: HOB elevated;Min assist     General bed mobility comments: use of bed rails, HHA provided to lift trunk    Transfers Overall transfer level: Needs assistance Equipment used: Rolling walker (2 wheeled) Transfers: Sit to/from SMerck & CoSit to Stand: Min guard Stand pivot transfers: Min guard       General transfer comment: STS using RW, 3 reps total. Stand pivot EOB>BSC, CGA to steady during pivot steps.  Ambulation/Gait Ambulation/Gait assistance: Min guard Gait Distance (Feet): 40 Feet Assistive device: Rolling walker (2 wheeled) Gait Pattern/deviations: Step-through pattern;Trunk flexed Gait velocity: decreased   General Gait Details: 458fusing RW, CGA to steady. SpO2 monitored, ending at 89% on RA.   Stairs             Wheelchair Mobility    Modified Rankin (Stroke Patients Only)       Balance Overall balance assessment: Needs assistance Sitting-balance support: Feet supported;No upper extremity supported Sitting balance-Leahy Scale: Good     Standing balance support: During functional activity;Bilateral upper extremity supported Standing balance-Leahy Scale: Fair Standing balance comment: requires BUE support on RW, CGA to steady                            Cognition Arousal/Alertness: Awake/alert Behavior During Therapy: WFL for tasks assessed/performed Overall Cognitive Status: History of cognitive impairments - at baseline                         Following Commands: Follows one step commands consistently Safety/Judgement: Decreased awareness of deficits;Decreased awareness of safety   Problem Solving: Slow processing        Exercises Other Exercises Other Exercises: Sitting and standing balance performed during toileting and pericare. Pt performed her own pericare as  PT provided CGA. PT also assisted to don a clean gown. Pt daughter arrived at end of session; PT notified daughter of pt performance during  treatment session, low SpO2 noted/increase with activity and communication with RN.    General Comments        Pertinent Vitals/Pain Pain Assessment: 0-10 Pain Score: 5  Pain Location: chest (soreness 2/2 CPR) Pain Descriptors / Indicators:  Sore Pain Intervention(s): Monitored during session (pain appeared to be less activity-limited today)    Home Living                      Prior Function            PT Goals (current goals can now be found in the care plan section) Acute Rehab PT Goals Patient Stated Goal: get better Potential to Achieve Goals: Fair    Frequency    Min 2X/week      PT Plan      Co-evaluation              AM-PAC PT "6 Clicks" Mobility   Outcome Measure  Help needed turning from your back to your side while in a flat bed without using bedrails?: A Little Help needed moving from lying on your back to sitting on the side of a flat bed without using bedrails?: A Little Help needed moving to and from a bed to a chair (including a wheelchair)?: A Little Help needed standing up from a chair using your arms (e.g., wheelchair or bedside chair)?: A Little Help needed to walk in hospital room?: A Little Help needed climbing 3-5 steps with a railing? : A Lot 6 Click Score: 17    End of Session Equipment Utilized During Treatment: Gait belt Activity Tolerance: Patient limited by fatigue Patient left: with call bell/phone within reach;with family/visitor present;in chair;with chair alarm set Nurse Communication: Mobility status;Other (comment) (low SpO2 noted on RA) PT Visit Diagnosis: Difficulty in walking, not elsewhere classified (R26.2);Muscle weakness (generalized) (M62.81);Unsteadiness on feet (R26.81)     Time: 1423-9532 PT Time Calculation (min) (ACUTE ONLY): 26 min  Charges:  $Gait Training: 8-22 mins $Therapeutic Activity: 8-22 mins                    Patrina Levering PT, DPT 04/23/21 12:37 PM [phone]    Ramonita Lab 04/23/2021, 12:30 PM

## 2021-04-24 DIAGNOSIS — I469 Cardiac arrest, cause unspecified: Secondary | ICD-10-CM | POA: Diagnosis not present

## 2021-04-24 DIAGNOSIS — I5033 Acute on chronic diastolic (congestive) heart failure: Secondary | ICD-10-CM | POA: Diagnosis not present

## 2021-04-24 DIAGNOSIS — N179 Acute kidney failure, unspecified: Secondary | ICD-10-CM | POA: Diagnosis not present

## 2021-04-24 DIAGNOSIS — I482 Chronic atrial fibrillation, unspecified: Secondary | ICD-10-CM | POA: Diagnosis not present

## 2021-04-24 LAB — COMPREHENSIVE METABOLIC PANEL
ALT: 145 U/L — ABNORMAL HIGH (ref 0–44)
AST: 73 U/L — ABNORMAL HIGH (ref 15–41)
Albumin: 3.2 g/dL — ABNORMAL LOW (ref 3.5–5.0)
Alkaline Phosphatase: 58 U/L (ref 38–126)
Anion gap: 11 (ref 5–15)
BUN: 40 mg/dL — ABNORMAL HIGH (ref 8–23)
CO2: 31 mmol/L (ref 22–32)
Calcium: 9.4 mg/dL (ref 8.9–10.3)
Chloride: 93 mmol/L — ABNORMAL LOW (ref 98–111)
Creatinine, Ser: 1.59 mg/dL — ABNORMAL HIGH (ref 0.44–1.00)
GFR, Estimated: 34 mL/min — ABNORMAL LOW (ref >60)
Glucose, Bld: 96 mg/dL (ref 70–99)
Potassium: 4.6 mmol/L (ref 3.5–5.1)
Sodium: 135 mmol/L (ref 135–145)
Total Bilirubin: 0.8 mg/dL (ref 0.3–1.2)
Total Protein: 6.7 g/dL (ref 6.5–8.1)

## 2021-04-24 LAB — PHOSPHORUS: Phosphorus: 3.9 mg/dL (ref 2.5–4.6)

## 2021-04-24 LAB — MAGNESIUM: Magnesium: 2 mg/dL (ref 1.7–2.4)

## 2021-04-24 MED ORDER — FUROSEMIDE 40 MG PO TABS
40.0000 mg | ORAL_TABLET | Freq: Two times a day (BID) | ORAL | Status: DC
  Administered 2021-04-25 – 2021-04-26 (×3): 40 mg via ORAL
  Filled 2021-04-24 (×3): qty 1

## 2021-04-24 NOTE — TOC Progression Note (Signed)
Transition of Care [facility]) - Progression Note    Patient Details  Name: Adriana Martin MRN: [id number] Date of Birth: 11/07/1945  Transition of Care [facility]) CM/SW [place], [place] Phone Number: 04/24/2021, 1:15 PM  Clinical Narrative:     CSW updated patient on bed offer from [facility] and that insurance Josem Kaufmann is pending, with anticipated dc for tomorrow.   CSW also lvm with patient's daughter [name] informing of the above.   Expected Discharge Plan: Skilled Nursing Facility Barriers to Discharge: Continued Medical Work up  Expected Discharge Plan and Services Expected Discharge Plan: [place] Choice: [place] arrangements for the past 2 months: Single Family Home                                       Social Determinants of Health (SDOH) Interventions    Readmission Risk Interventions Readmission Risk Prevention Plan 04/06/2021  Transportation Screening Complete  PCP or Specialist Appt within 3-5 Days Complete  HRI or [place] Complete  Social Work Consult for [place] Planning/Counseling Complete  Palliative Care Screening Complete  Medication Review Press photographer) Complete  Some recent data might be hidden

## 2021-04-24 NOTE — Progress Notes (Signed)
Patient ID: Adriana Martin, female   DOB: [birth year], 75 y.o.   MRN: [id number] [facility] PROGRESS NOTE  Adriana Martin DOA: 04/19/2021 PCP: Adriana Kayser, MD (Inactive)  HPI/Subjective: Patient was feeling better today.  Not much discomfort in the chest.  Only more when she is moving.  Occasional shortness of breath.  Some cough.  Looking forward to getting home.  Patient agreeable to go to rehab.  Initially admitted with ventricular fibrillation cardiac arrest.  Objective: Vitals:   04/24/21 0535 04/24/21 0805  BP: 135/67 (!) 101/50  Pulse: 84 60  Resp: 18 20  Temp: 98.5 F (36.9 C) 98.4 F (36.9 C)  SpO2: 93% 97%    Intake/Output Summary (Last 24 hours) at 04/24/2021 1343 Last data filed at 04/24/2021 0930 Gross per 24 hour  Intake 720 ml  Output 1500 ml  Net -780 ml   Filed Weights   04/19/21 2023 04/23/21 0116 04/24/21 0115  Weight: 78 kg 77 kg 76.6 kg    ROS: Review of Systems  Respiratory:  Positive for shortness of breath.   Cardiovascular:  Positive for chest pain.  Gastrointestinal:  Negative for abdominal pain, nausea and vomiting.  Exam: Physical Exam HENT:     Head: Normocephalic.     Mouth/Throat:     Pharynx: No oropharyngeal exudate.  Eyes:     General: Lids are normal.     Conjunctiva/sclera: Conjunctivae normal.     Pupils: Pupils are equal, round, and reactive to light.  Cardiovascular:     Rate and Rhythm: Normal rate and regular rhythm.     Heart sounds: Normal heart sounds, S1 normal and S2 normal.  Pulmonary:     Breath sounds: Examination of the right-lower field reveals decreased breath sounds. Examination of the left-lower field reveals decreased breath sounds. Decreased breath sounds present. No wheezing, rhonchi or rales.  Abdominal:     Palpations: Abdomen is soft.     Tenderness: There is no abdominal tenderness.  Musculoskeletal:     Right lower leg: No swelling.     Left lower leg: No  swelling.  Skin:    General: Skin is warm.     Comments: Bruising bilateral arms.  Neurological:     Mental Status: She is alert.     Comments: Answer some yes or no questions.     Data Reviewed: Basic Metabolic Panel: Recent Labs  Lab 04/20/21 1114 04/20/21 1848 04/21/21 0546 04/22/21 0715 04/23/21 0503 04/24/21 0548  NA 136  --  138 135 131* 135  K 3.4* 5.1 4.6 4.2 4.0 4.6  CL 96*  --  98 98 93* 93*  CO2 24  --  '28 27 30 31  '$ GLUCOSE 181*  --  115* 101* 98 96  BUN 21  --  28* 28* 39* 40*  CREATININE 1.33*  --  1.42* 1.40* 1.76* 1.59*  CALCIUM 8.6*  --  8.9 8.6* 8.9 9.4  MG 1.9 2.2 2.2 1.9 2.0 2.0  PHOS 4.1  --  3.8 2.9 4.0 3.9   Liver Function Tests: Recent Labs  Lab 04/20/21 0345 04/20/21 1114 04/24/21 0548  AST 117* 181* 73*  ALT 159* 191* 145*  ALKPHOS 60 61 58  BILITOT 0.9 0.9 0.8  PROT 6.7 6.5 6.7  ALBUMIN 3.1* 2.9* 3.2*   CBC: Recent Labs  Lab 04/19/21 2152 04/21/21 0546 04/23/21 0503  WBC 9.6 13.9* 11.5*  HGB 12.1 10.1* 10.6*  HCT 39.6 31.3* 32.1*  MCV 98.0  93.7 92.5  PLT 202 191 232   BNP (last 3 results) Recent Labs    04/04/21 1329 04/19/21 2152  BNP 268.0* 558.3*     CBG: Recent Labs  Lab 04/20/21 1034  GLUCAP 180*    Recent Results (from the past 240 hour(s))  Resp Panel by RT-PCR (Flu A&B, Covid) Nasopharyngeal Swab     Status: None   Collection Time: 04/19/21 10:23 PM   Specimen: Nasopharyngeal Swab; Nasopharyngeal(NP) swabs in vial transport medium  Result Value Ref Range Status   SARS Coronavirus 2 by RT PCR NEGATIVE NEGATIVE Final    Comment: (NOTE) SARS-CoV-2 target nucleic acids are NOT DETECTED.  The SARS-CoV-2 RNA is generally detectable in upper respiratory specimens during the acute phase of infection. The lowest concentration of SARS-CoV-2 viral copies this assay can detect is 138 copies/mL. A negative result does not preclude SARS-Cov-2 infection and should not be used as the sole basis for treatment  or other patient management decisions. A negative result may occur with  improper specimen collection/handling, submission of specimen other than nasopharyngeal swab, presence of viral mutation(s) within the areas targeted by this assay, and inadequate number of viral copies(<138 copies/mL). A negative result must be combined with clinical observations, patient history, and epidemiological information. The expected result is Negative.  Fact Sheet for Patients:  EntrepreneurPulse.com.au  Fact Sheet for Healthcare Providers:  IncredibleEmployment.be  This test is no t yet approved or cleared by the [place] FDA and  has been authorized for detection and/or diagnosis of SARS-CoV-2 by FDA under an Emergency Use Authorization (EUA). This EUA will remain  in effect (meaning this test can be used) for the duration of the COVID-19 declaration under Section 564(b)(1) of the Act, 21 U.S.C.section 360bbb-3(b)(1), unless the authorization is terminated  or revoked sooner.       Influenza A by PCR NEGATIVE NEGATIVE Final   Influenza B by PCR NEGATIVE NEGATIVE Final    Comment: (NOTE) The Xpert Xpress SARS-CoV-2/FLU/RSV plus assay is intended as an aid in the diagnosis of influenza from Nasopharyngeal swab specimens and should not be used as a sole basis for treatment. Nasal washings and aspirates are unacceptable for Xpert Xpress SARS-CoV-2/FLU/RSV testing.  Fact Sheet for Patients: EntrepreneurPulse.com.au  Fact Sheet for Healthcare Providers: IncredibleEmployment.be  This test is not yet approved or cleared by the [place] FDA and has been authorized for detection and/or diagnosis of SARS-CoV-2 by FDA under an Emergency Use Authorization (EUA). This EUA will remain in effect (meaning this test can be used) for the duration of the COVID-19 declaration under Section 564(b)(1) of the Act, 21 U.S.C. section  360bbb-3(b)(1), unless the authorization is terminated or revoked.  Performed at [facility], [address] [place], [place] [address]       Scheduled Meds:  amiodarone  400 mg Oral BID   apixaban  5 mg Oral BID   donepezil  5 mg Oral QHS   ezetimibe  10 mg Oral Daily   [START ON 04/25/2021] furosemide  40 mg Oral BID   levothyroxine  50 mcg Oral Q0600   meloxicam  15 mg Oral Daily   mometasone-formoterol  2 puff Inhalation BID   pantoprazole  40 mg Oral BID   pravastatin  40 mg Oral QHS   predniSONE  5 mg Oral Q breakfast   sodium chloride flush  3 mL Intravenous Q12H   tamoxifen  20 mg Oral Daily   ursodiol  600 mg Oral BID  venlafaxine XR  37.5 mg Oral Daily     Assessment/Plan:  Ventricular fibrillation cardiac arrest.  Patient has chronic on Atrial fibrillation.  Continue oral amiodarone and Eliquis.  Her soreness on the chest is a little bit less today likely from CPR. Chronic atrial fibrillation/flutter.  Patient has a pacemaker.  On oral amiodarone.  Eliquis for anticoagulation Acute on chronic diastolic congestive heart failure.  Holding Lasix again today.  We will start oral Lasix tomorrow Acute kidney injury on chronic kidney disease stage IIIa.  Creatinine went up to 1.76 yesterday and down to 1.59 today.  As low as 1.08 during the hospital course. 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  Continue pravastatin and Zetia Memory loss on donezepil Breast cancer treatment on tamoxifen Hypothyroidism unspecified.  Continue levothyroxine Weakness.  Physical therapy recommending rehab.  Awaiting insurance authorization for rehab. Acute hypoxic respiratory failure.  Pulse ox 83% on room air.  Continue oxygen for pulse ox less than 88%.  Will qualify for oxygen at home with pulse ox on room air with ambulation.        Code Status:     Code Status Orders  (From admission, onward)            Start     Ordered   04/19/21 2356  Full code  Continuous        04/19/21 2356           Code Status History     Date Active Date Inactive Code Status Order ID Comments User Context   04/04/2021 1329 04/07/2021 2301 Full Code [id number]  Ivor Costa, MD ED   03/10/2020 1753 03/15/2020 2314 Full Code [id number]  Amie Portland, MD Inpatient   01/07/2018 1305 01/08/2018 1741 Full Code [id number]  Leim Fabry, MD Inpatient   06/07/2016 0710 06/07/2016 0937 Full Code [id number]  Mikael Spray, NP ED   03/22/2016 1232 03/24/2016 1432 Full Code [id number]  Theodoro Grist, MD Inpatient   05/02/2015 1443 05/03/2015 2003 Full Code [id number]  Vaughan Basta, MD Inpatient      Advance Directive Documentation    Flowsheet Row Most Recent Value  Type of Advance Directive Healthcare Power of Attorney, Living will  Pre-existing out of facility DNR order (yellow form or pink MOST form) --  "MOST" Form in Place? --      Family Communication: Spoke with patient's daughter on the phone Disposition Plan: Status is: Inpatient  Dispo: The patient is from: Home              Anticipated d/c is to: Rehab once insurance authorizes              Patient currently stable to go out to rehab tomorrow.   Difficult to place patient.  No.  Consultants: Cardiology  Time spent: 27 minutes  [place]

## 2021-04-25 DIAGNOSIS — I469 Cardiac arrest, cause unspecified: Secondary | ICD-10-CM | POA: Diagnosis not present

## 2021-04-25 DIAGNOSIS — I5033 Acute on chronic diastolic (congestive) heart failure: Secondary | ICD-10-CM | POA: Diagnosis not present

## 2021-04-25 DIAGNOSIS — N179 Acute kidney failure, unspecified: Secondary | ICD-10-CM | POA: Diagnosis not present

## 2021-04-25 DIAGNOSIS — I482 Chronic atrial fibrillation, unspecified: Secondary | ICD-10-CM | POA: Diagnosis not present

## 2021-04-25 LAB — BASIC METABOLIC PANEL
Anion gap: 8 (ref 5–15)
BUN: 46 mg/dL — ABNORMAL HIGH (ref 8–23)
CO2: 28 mmol/L (ref 22–32)
Calcium: 8.9 mg/dL (ref 8.9–10.3)
Chloride: 96 mmol/L — ABNORMAL LOW (ref 98–111)
Creatinine, Ser: 1.51 mg/dL — ABNORMAL HIGH (ref 0.44–1.00)
GFR, Estimated: 36 mL/min — ABNORMAL LOW (ref >60)
Glucose, Bld: 111 mg/dL — ABNORMAL HIGH (ref 70–99)
Potassium: 4.9 mmol/L (ref 3.5–5.1)
Sodium: 132 mmol/L — ABNORMAL LOW (ref 135–145)

## 2021-04-25 MED ORDER — AMIODARONE HCL 200 MG PO TABS
200.0000 mg | ORAL_TABLET | Freq: Two times a day (BID) | ORAL | Status: DC
  Administered 2021-04-25 – 2021-04-28 (×7): 200 mg via ORAL
  Filled 2021-04-25 (×7): qty 1

## 2021-04-25 NOTE — Progress Notes (Signed)
Occupational Therapy Treatment Patient Details Name: Adriana Martin MRN: [id number] DOB: 10-20-1945 Today's Date: 04/25/2021    History of present illness Pt is a 75 y/o F admitted on 04/19/21 with c/o SOB. Pt was admitted for CHF exacerbation. On 04/20/21 pt had v tach cardiac arrest & CPR & intubation with transfer to ICU. Pt extubated later that day. PMH: HTN, HLD, hypothyroidism, hx of heart block s/p permanent pace maker, COPD, CKD3B, a-fib, heart failure, breast CA, cirrhosis, dementia   OT comments  Pt seen for OT Tx this date. Pt requires MIN A to come to sitting with HOB elevated. OT engages pt seated UB g/h tasks with SETUP and cues to initiate and sequence including washing face, hands and oral care.  Pt requires CGA to CTS with RW and CGA/MIN A to take ~5-6 steps from EOB to chair. Left in chair with chair alarm. All needs met and in reach. Will continue to follow.    Follow Up Recommendations  SNF    Equipment Recommendations  Other (comment) (defer)    Recommendations for Other Services      Precautions / Restrictions Precautions Precautions: Fall Restrictions Weight Bearing Restrictions: No       Mobility Bed Mobility Overal bed mobility: Needs Assistance Bed Mobility: Supine to Sit     Supine to sit: Min assist;HOB elevated     General bed mobility comments: increased time    Transfers Overall transfer level: Needs assistance Equipment used: Rolling walker (2 wheeled) Transfers: Sit to/from Stand Sit to Stand: Min guard         General transfer comment: cues for hand placement    Balance Overall balance assessment: Needs assistance   Sitting balance-Leahy Scale: Good       Standing balance-Leahy Scale: Fair                             ADL either performed or assessed with clinical judgement   ADL Overall ADL's : Needs assistance/impaired     Grooming: Wash/dry face;Wash/dry hands;Sitting;Cueing for sequencing;Set up                                        Vision Patient Visual Report: No change from baseline     Perception     Praxis      Cognition Arousal/Alertness: Awake/alert Behavior During Therapy: WFL for tasks assessed/performed Overall Cognitive Status: History of cognitive impairments - at baseline Area of Impairment: Following commands;Safety/judgement;Problem solving                 Orientation Level: Disoriented to;Place;Time;Situation   Memory: Decreased short-term memory Following Commands: Follows multi-step commands with increased time Safety/Judgement: Decreased awareness of safety;Decreased awareness of deficits Awareness: Anticipatory;Emergent Problem Solving: Slow processing;Difficulty sequencing General Comments: hx of dementia, only oriented to self & city.  Unable to state needs when grimacing/guarding her movement.        Exercises     Shoulder Instructions       General Comments      Pertinent Vitals/ Pain       Pain Assessment: Faces Faces Pain Scale: Hurts little more Pain Location: chest (soreness 2/2 CPR) Pain Descriptors / Indicators: Sore Pain Intervention(s): Monitored during session;Repositioned  Home Living  Prior Functioning/Environment              Frequency  Min 2X/week        Progress Toward Goals  OT Goals(current goals can now be found in the care plan section)  Progress towards OT goals: Progressing toward goals  Acute Rehab OT Goals Patient Stated Goal: get better OT Goal Formulation: With patient/family Time For Goal Achievement: 05/05/21 Potential to Achieve Goals: [place] Discharge plan remains appropriate;Frequency remains appropriate    Co-evaluation                 AM-PAC OT "6 Clicks" Daily Activity     Outcome Measure   Help from another person eating meals?: A Little Help from another person taking care of personal  grooming?: A Little Help from another person toileting, which includes using toliet, bedpan, or urinal?: A Little Help from another person bathing (including washing, rinsing, drying)?: A Lot Help from another person to put on and taking off regular upper body clothing?: A Little Help from another person to put on and taking off regular lower body clothing?: A Lot 6 Click Score: 16    End of Session Equipment Utilized During Treatment: Rolling walker;Oxygen  OT Visit Diagnosis: Muscle weakness (generalized) (M62.81);Other symptoms and signs involving cognitive function   Activity Tolerance Patient tolerated treatment well   Patient Left in bed;with call bell/phone within reach;with bed alarm set;with nursing/sitter in room;with family/visitor present   Nurse Communication Mobility status        Time: 1025-1059 OT Time Calculation (min): 34 min  Charges: OT General Charges $OT Visit: 1 Visit OT Treatments $Self Care/Home Management : 8-22 mins $Therapeutic Activity: 8-22 mins  Gerrianne Scale, [place], OTR/L ascom [phone] 04/25/21, 11:02 AM

## 2021-04-25 NOTE — Progress Notes (Signed)
[facility] Encounter Note  Patient: Adriana Martin / Admit Date: 04/19/2021 / Date of Encounter: 04/25/2021, 9:13 AM   Subjective: Patient still somewhat short of breath due to significant decrease in exercise tolerance and recent illness with slow improvements.  Patient had ventricular tachycardia of unknown etiology with normal LV systolic function and heart failure with preserved ejection fraction.  Possibly secondary to circumstances and or low potassium now corrected.  Patient was placed on amiodarone and currently no evidence of recurrent ventricular tachycardia.  Atrial fibrillation has been chronic with good heart rate control  Review of Systems: Positive for: Shortness of breath Negative for: Vision change, hearing change, syncope, dizziness, nausea, vomiting,diarrhea, bloody stool, stomach pain, cough, congestion, diaphoresis, urinary frequency, urinary pain,skin lesions, skin rashes Others previously listed  Objective: Telemetry: Atrial fibrillation Physical Exam: Blood pressure (!) 121/52, pulse 61, temperature 97.6 F (36.4 C), resp. rate 16, height '5\' 2"'$  (1.575 m), weight 77.2 kg, SpO2 99 %. Body mass index is 31.11 kg/m. General: Well developed, well nourished, in no acute distress. Head: Normocephalic, atraumatic, sclera non-icteric, no xanthomas, nares are without discharge. Neck: No apparent masses Lungs: Normal respirations with no wheezes, no rhonchi, no rales , no crackles   Heart: Irregular rate and rhythm, normal S1 S2, no murmur, no rub, no gallop, PMI is normal size and placement, carotid upstroke normal without bruit, jugular venous pressure normal Abdomen: Soft, non-tender, non-distended with normoactive bowel sounds. No hepatosplenomegaly. Abdominal aorta is normal size without bruit Extremities: No edema, no clubbing, no cyanosis, no ulcers,  Peripheral: 2+ radial, 2+ femoral, 2+ dorsal pedal pulses Neuro: Alert and oriented. Moves  all extremities spontaneously. Psych:  Responds to questions appropriately with a normal affect.   Intake/Output Summary (Last 24 hours) at 04/25/2021 0913 Last data filed at 04/25/2021 0511 Gross per 24 hour  Intake 720 ml  Output 850 ml  Net -130 ml    Inpatient Medications:   amiodarone  200 mg Oral BID   apixaban  5 mg Oral BID   donepezil  5 mg Oral QHS   ezetimibe  10 mg Oral Daily   furosemide  40 mg Oral BID   levothyroxine  50 mcg Oral Q0600   meloxicam  15 mg Oral Daily   mometasone-formoterol  2 puff Inhalation BID   pantoprazole  40 mg Oral BID   pravastatin  40 mg Oral QHS   predniSONE  5 mg Oral Q breakfast   sodium chloride flush  3 mL Intravenous Q12H   tamoxifen  20 mg Oral Daily   ursodiol  600 mg Oral BID   venlafaxine XR  37.5 mg Oral Daily   Infusions:   Labs: Recent Labs    04/23/21 0503 04/24/21 0548 04/25/21 0529  NA 131* 135 132*  K 4.0 4.6 4.9  CL 93* 93* 96*  CO2 '30 31 28  '$ GLUCOSE 98 96 111*  BUN 39* 40* 46*  CREATININE 1.76* 1.59* 1.51*  CALCIUM 8.9 9.4 8.9  MG 2.0 2.0  --   PHOS 4.0 3.9  --    Recent Labs    04/24/21 0548  AST 73*  ALT 145*  ALKPHOS 58  BILITOT 0.8  PROT 6.7  ALBUMIN 3.2*   Recent Labs    04/23/21 0503  WBC 11.5*  HGB 10.6*  HCT 32.1*  MCV 92.5  PLT 232   No results for input(s): CKTOTAL, CKMB, TROPONINI in the last 72 hours. Invalid input(s): POCBNP No results for  input(s): HGBA1C in the last 72 hours.   Weights: Filed Weights   04/23/21 0116 04/24/21 0115 04/25/21 0240  Weight: 77 kg 76.6 kg 77.2 kg     Radiology/Studies:  DG Chest 2 View  Result Date: 04/19/2021 CLINICAL DATA:  Shortness of breath for several days. Hypoxia. EXAM: CHEST - 2 VIEW COMPARISON:  06/08/2016 FINDINGS: Moderate cardiomegaly shows no significant change. 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 A new tiny right pleural effusion  is seen. No evidence of acute pulmonary edema or other infiltrate. Calcified granuloma again seen in the right lower lung. Right shoulder prosthesis also noted. IMPRESSION: Stable cardiomegaly. New tiny right pleural effusion. Electronically Signed   By: Marlaine Hind M.D.   On: 04/19/2021 21:10   ECHOCARDIOGRAM COMPLETE  Result Date: 04/05/2021    ECHOCARDIOGRAM REPORT   Patient Name:   Adriana Martin Date of Exam: 04/05/2021 Medical Rec #:  [id number]            Height:       62.0 in Accession #:    [id number]           Weight:       164.0 lb Date of Birth:  [birth year]            BSA:          1.757 m Patient Age:    64 years             BP:           109/51 mmHg Patient Gender: F                    HR:           67 bpm. Exam Location:  [facility] Procedure: 2D Echo Indications:     Atrial Flutter I48.92  History:         Patient has no prior history of Echocardiogram examinations.  Sonographer:     Kathlen Brunswick RDCS Referring Phys:  [id number] [place] Diagnosing Phys: Skeet Latch MD IMPRESSIONS  1. Left ventricular ejection fraction, by estimation, is 60 to 65%. The left ventricle has normal function. The left ventricle has no regional wall motion abnormalities. Left ventricular diastolic parameters are indeterminate.  2. Right ventricular systolic function is normal. The right ventricular size is normal. There is normal pulmonary artery systolic pressure.  3. Left atrial size was severely dilated.  4. Right atrial size was severely dilated.  5. Compared with the echo 02/2020, mean bioprosthetic mitral valve gradient has increased from 10 mmHg to 16 mmHg. Visually the valve appears unchanged from 02/2020. Gradients may be elevated in the setting of high flow and sepsis. No increased thickness or vegetations concerning for endocarditis. The mitral valve has been repaired/replaced. No evidence of mitral valve regurgitation. Severe mitral stenosis. Echo findings are consistent with normal structure and  function of the mitral valve prosthesis.  6. The aortic valve is tricuspid. Aortic valve regurgitation is trivial. No aortic stenosis is present.  7. The inferior vena cava is normal in size with greater than 50% respiratory variability, suggesting right atrial pressure of 3 mmHg. FINDINGS  Left Ventricle: Left ventricular ejection fraction, by estimation, is 60 to 65%. The left ventricle has normal function. The left ventricle has no regional wall motion abnormalities. The left ventricular internal cavity size was normal in size. There is  no left ventricular  hypertrophy. Left ventricular diastolic parameters are indeterminate. Right Ventricle: The right ventricular size is normal. No increase in right ventricular wall thickness. Right ventricular systolic function is normal. There is normal pulmonary artery systolic pressure. The tricuspid regurgitant velocity is 2.35 m/s, and  with an assumed right atrial pressure of 3 mmHg, the estimated right ventricular systolic pressure is [id number] mmHg. Left Atrium: Left atrial size was severely dilated. Right Atrium: Right atrial size was severely dilated. Pericardium: There is no evidence of pericardial effusion. Mitral Valve: Compared with the echo 02/2020, mean bioprosthetic mitral valve gradient has increased from 10 mmHg to 16 mmHg. Visually the valve appears unchanged from 02/2020. Gradients may be elevated in the setting of high flow and sepsis. No increased thickness or vegetations concerning for endocarditis. The mitral valve has been repaired/replaced. No evidence of mitral valve regurgitation. Echo findings are consistent with normal structure and function of the mitral valve prosthesis. Severe mitral valve stenosis. MV peak gradient, 38.4 mmHg. The mean mitral valve gradient is 16.0 mmHg. Tricuspid Valve: The tricuspid valve is normal in structure. Tricuspid valve regurgitation is trivial. No evidence of tricuspid stenosis. Aortic Valve: The aortic valve is tricuspid.  Aortic valve regurgitation is trivial. No aortic stenosis is present. Aortic valve mean gradient measures 6.0 mmHg. Aortic valve peak gradient measures 13.7 mmHg. Aortic valve area, by VTI measures 1.44 cm. Pulmonic Valve: The pulmonic valve was normal in structure. Pulmonic valve regurgitation is not visualized. No evidence of pulmonic stenosis. Aorta: The aortic root is normal in size and structure. Venous: The inferior vena cava is normal in size with greater than 50% respiratory variability, suggesting right atrial pressure of 3 mmHg. IAS/Shunts: No atrial level shunt detected by color flow Doppler.  LEFT VENTRICLE PLAX 2D LVIDd:         4.30 cm  Diastology LVIDs:         3.00 cm  LV e' medial:    4.35 cm/s LV PW:         1.40 cm  LV E/e' medial:  49.4 LV IVS:        1.00 cm  LV e' lateral:   4.68 cm/s LVOT diam:     1.80 cm  LV E/e' lateral: 45.9 LV SV:         43 LV SV Index:   25 LVOT Area:     2.54 cm  LEFT ATRIUM           Index       RIGHT ATRIUM           Index LA diam:      4.80 cm 2.73 cm/m  RA Area:     23.50 cm LA Vol (A2C): 32.1 ml 18.27 ml/m RA Volume:   77.90 ml  44.33 ml/m LA Vol (A4C): 71.0 ml 40.41 ml/m  AORTIC VALVE                    PULMONIC VALVE AV Area (Vmax):    1.22 cm     PV Vmax:       0.91 m/s AV Area (Vmean):   1.28 cm     PV Peak grad:  3.3 mmHg AV Area (VTI):     1.44 cm AV Vmax:           185.00 cm/s AV Vmean:          114.000 cm/s AV VTI:            0.300 m  AV Peak Grad:      13.7 mmHg AV Mean Grad:      6.0 mmHg LVOT Vmax:         88.70 cm/s LVOT Vmean:        57.200 cm/s LVOT VTI:          0.170 m LVOT/AV VTI ratio: 0.57  AORTA Ao Root diam: 2.90 cm Ao Asc diam:  3.20 cm MITRAL VALVE                TRICUSPID VALVE MV Area (PHT): 0.96 cm     TV Peak grad:   25.8 mmHg MV Area VTI:   0.32 cm     TV Vmax:        2.54 m/s MV Peak grad:  38.4 mmHg    TR Peak grad:   22.1 mmHg MV Mean grad:  16.0 mmHg    TR Vmax:        235.00 cm/s MV Vmax:       3.10 m/s MV Vmean:       184.0 cm/s   SHUNTS MV Decel Time: 789 msec     Systemic VTI:  0.17 m MV E velocity: 215.00 cm/s  Systemic Diam: 1.80 cm Skeet Latch MD Electronically signed by Skeet Latch MD Signature Date/Time: 04/05/2021/1:33:45 PM    Final    CT Angio Abd/Pel w/ and/or w/o  Result Date: 04/04/2021 CLINICAL DATA:  Rectal bleeding, Dementia, recent fall EXAM: CTA ABDOMEN AND PELVIS WITHOUT AND WITH CONTRAST TECHNIQUE: Multidetector CT imaging of the abdomen and pelvis was performed using the standard protocol during bolus administration of intravenous contrast. Multiplanar reconstructed images and MIPs were obtained and reviewed to evaluate the vascular anatomy. CONTRAST:  96m OMNIPAQUE IOHEXOL 350 MG/ML SOLN COMPARISON:  03/20/2013 FINDINGS: VASCULAR Aorta: Moderate calcified atheromatous plaque. No aneurysm, dissection, or stenosis. Celiac: Calcified ostial plaque without stenosis, patent distally within remarkable branch anatomy. SMA: Ostial plaque extending over length of approximately 1.6 cm resulting in moderate stenosis. Vessels patent distally with classic branch anatomy. Renals: Single left, with proximal branching. No stenosis. Duplicated right, superior dominant, both patent. IMA: Patent without evidence of aneurysm, dissection, vasculitis or significant stenosis. Inflow: Mild plaque in bilateral common iliac arteries without stenosis. No dissection or aneurysm. Proximal Outflow: Bilateral common femoral and visualized portions of the superficial and profunda femoral arteries are patent without evidence of aneurysm, dissection, vasculitis or significant stenosis. Veins: Patent hepatic veins, portal vein, SMV, splenic vein, bilateral renal veins. Scattered mural calcifications in right common femoral and common iliac veins suggesting sequela of previous central venous catheter or DVT. IVC is nondistended, unremarkable. Review of the MIP images confirms the above findings. NON-VASCULAR Lower chest:  Cardiomegaly with biatrial enlargement. Transvenous pacing leads partially visualized. Mitral valve replacement. No pleural or pericardial effusion. Hepatobiliary: No focal liver abnormality is seen. No gallstones, gallbladder wall thickening, or biliary dilatation. Pancreas: Unremarkable. No pancreatic ductal dilatation or surrounding inflammatory changes. Spleen: Normal in size without focal abnormality. Adrenals/Urinary Tract: Adrenal glands unremarkable. Symmetric renal enhancement. No hydronephrosis or urolithiasis. Urinary bladder incompletely distended. Stomach/Bowel: Stomach and small bowel are nondistended. Appendix not identified. No pericecal inflammatory change. The colon is nondilated. Suspect mild circumferential wall thickening in the splenic flexure with mild adjacent inflammatory change. Scattered sigmoid diverticula without adjacent inflammatory change. No evidence of active extravasation. Lymphatic: No abdominal or pelvic adenopathy. Reproductive: Status post hysterectomy. No adnexal masses. Other: Bilateral pelvic phleboliths.  No ascites.  No free air. Musculoskeletal: Chronic  T8 and L1 vertebral body compression deformities stable since 07/27/2019. Stable grade 1 anterolisthesis L4-5 without pars defect. No acute fracture or aggressive bone lesion. IMPRESSION: 1. No evidence of active GI bleeding. 2. Wall thickening in the splenic flexure of the colon suggesting colitis, nonspecific. Consider GI consultation. 3. Sigmoid diverticulosis Electronically Signed   By: Lucrezia Europe M.D.   On: 04/04/2021 12:02     Assessment and Recommendation  75 y.o. female with known heart failure with preserved ejection fraction atrial fibrillation hypertension hyperlipidemia with acute illness and sepsis status post ventricular tachycardia of unknown primary etiology now controlled with amiodarone and slowly recovering 1.  Continue amiodarone and decreasing dose to 200 mg twice per day and follow-up for  significant side effects 2.  No further cardiac diagnostics necessary at this time 3.  Begin ambulation and follow-up for improvement of symptoms and possible discharge to nursing facility with follow-up in 1 week for further adjustments of medication management  Signed, Serafina Royals M.D. FACC

## 2021-04-25 NOTE — Care Management Important Message (Signed)
Important Message  Patient Details  Name: Adriana Martin MRN: [id number] Date of Birth: September 19, 1946   Medicare Important Message Given:  Yes  Patient was experiencing some pain and had dropped her nurse call button so assisted her with obtaining it and contacting the nurses station for assistance.  She did not feel like signing the form and I let her know that was okay and thanked her for time.  [name] Adriana Martin 04/25/2021, 1:50 PM

## 2021-04-25 NOTE — Progress Notes (Addendum)
Patient ID: Adriana Martin, female   DOB: 07-28-1946, 75 y.o.   MRN: [id number] [facility] PROGRESS NOTE  Adriana Martin [id number] DOB: 1946-03-14 DOA: 04/19/2021 PCP: [name], MD (Inactive)  HPI/Subjective: Patient seen this morning.  She was feeling okay.  Some chest discomfort.  Seen before breakfast.  Did not recall how she ate yesterday.  Admitted with a cardiac arrest.  Objective: Vitals:   04/25/21 1531 04/25/21 1559  BP: (!) 111/51   Pulse: 72   Resp: 16   Temp: 97.7 F (36.5 C)   SpO2: (!) 85% 98%    Intake/Output Summary (Last 24 hours) at 04/25/2021 1649 Last data filed at 04/25/2021 1633 Gross per 24 hour  Intake 480 ml  Output 1650 ml  Net -1170 ml   Filed Weights   04/23/21 0116 04/24/21 0115 04/25/21 0240  Weight: 77 kg 76.6 kg 77.2 kg    ROS: Review of Systems  Respiratory:  Negative for shortness of breath.   Cardiovascular:  Positive for chest pain.  Gastrointestinal:  Negative for abdominal pain, nausea and vomiting.  Exam: Physical Exam HENT:     Head: Normocephalic.     Mouth/Throat:     Pharynx: No oropharyngeal exudate.  Eyes:     General: Lids are normal.     Conjunctiva/sclera: Conjunctivae normal.  Cardiovascular:     Rate and Rhythm: Normal rate and regular rhythm.     Heart sounds: Normal heart sounds, S1 normal and S2 normal.  Pulmonary:     Breath sounds: Examination of the right-lower field reveals decreased breath sounds. Examination of the left-lower field reveals decreased breath sounds. Decreased breath sounds present. No wheezing, rhonchi or rales.  Abdominal:     Palpations: Abdomen is soft.     Tenderness: There is no abdominal tenderness.  Musculoskeletal:     Right lower leg: Swelling present.     Left lower leg: Swelling present.  Skin:    General: Skin is warm.     Comments: Bruising on arms.  Neurological:     Mental Status: She is alert.     Comments: Answer some yes/no questions.      Data Reviewed: Basic Metabolic Panel: Recent Labs  Lab 04/20/21 1114 04/20/21 1848 04/21/21 0546 04/22/21 0715 04/23/21 0503 04/24/21 0548 04/25/21 0529  NA 136  --  138 135 131* 135 132*  K 3.4* 5.1 4.6 4.2 4.0 4.6 4.9  CL 96*  --  98 98 93* 93* 96*  CO2 24  --  '28 27 30 31 28  '$ GLUCOSE 181*  --  115* 101* 98 96 111*  BUN 21  --  28* 28* 39* 40* 46*  CREATININE 1.33*  --  1.42* 1.40* 1.76* 1.59* 1.51*  CALCIUM 8.6*  --  8.9 8.6* 8.9 9.4 8.9  MG 1.9 2.2 2.2 1.9 2.0 2.0  --   PHOS 4.1  --  3.8 2.9 4.0 3.9  --    Liver Function Tests: Recent Labs  Lab 04/20/21 0345 04/20/21 1114 04/24/21 0548  AST 117* 181* 73*  ALT 159* 191* 145*  ALKPHOS 60 61 58  BILITOT 0.9 0.9 0.8  PROT 6.7 6.5 6.7  ALBUMIN 3.1* 2.9* 3.2*   CBC: Recent Labs  Lab 04/19/21 2152 04/21/21 0546 04/23/21 0503  WBC 9.6 13.9* 11.5*  HGB 12.1 10.1* 10.6*  HCT 39.6 31.3* 32.1*  MCV 98.0 93.7 92.5  PLT 202 191 232    BNP (last 3 results) Recent Labs  04/04/21 1329 04/19/21 2152  BNP 268.0* 558.3*     CBG: Recent Labs  Lab 04/20/21 1034  GLUCAP 180*    Recent Results (from the past 240 hour(s))  Resp Panel by RT-PCR (Flu A&B, Covid) Nasopharyngeal Swab     Status: None   Collection Time: 04/19/21 10:23 PM   Specimen: Nasopharyngeal Swab; Nasopharyngeal(NP) swabs in vial transport medium  Result Value Ref Range Status   SARS Coronavirus 2 by RT PCR NEGATIVE NEGATIVE Final    Comment: (NOTE) SARS-CoV-2 target nucleic acids are NOT DETECTED.  The SARS-CoV-2 RNA is generally detectable in upper respiratory specimens during the acute phase of infection. The lowest concentration of SARS-CoV-2 viral copies this assay can detect is 138 copies/mL. A negative result does not preclude SARS-Cov-2 infection and should not be used as the sole basis for treatment or other patient management decisions. A negative result may occur with  improper specimen collection/handling, submission of  specimen other than nasopharyngeal swab, presence of viral mutation(s) within the areas targeted by this assay, and inadequate number of viral copies(<138 copies/mL). A negative result must be combined with clinical observations, patient history, and epidemiological information. The expected result is Negative.  Fact Sheet for Patients:  EntrepreneurPulse.com.au  Fact Sheet for Healthcare Providers:  IncredibleEmployment.be  This test is no t yet approved or cleared by the [place] FDA and  has been authorized for detection and/or diagnosis of SARS-CoV-2 by FDA under an Emergency Use Authorization (EUA). This EUA will remain  in effect (meaning this test can be used) for the duration of the COVID-19 declaration under Section 564(b)(1) of the Act, 21 U.S.C.section 360bbb-3(b)(1), unless the authorization is terminated  or revoked sooner.       Influenza A by PCR NEGATIVE NEGATIVE Final   Influenza B by PCR NEGATIVE NEGATIVE Final    Comment: (NOTE) The Xpert Xpress SARS-CoV-2/FLU/RSV plus assay is intended as an aid in the diagnosis of influenza from Nasopharyngeal swab specimens and should not be used as a sole basis for treatment. Nasal washings and aspirates are unacceptable for Xpert Xpress SARS-CoV-2/FLU/RSV testing.  Fact Sheet for Patients: EntrepreneurPulse.com.au  Fact Sheet for Healthcare Providers: IncredibleEmployment.be  This test is not yet approved or cleared by the [place] FDA and has been authorized for detection and/or diagnosis of SARS-CoV-2 by FDA under an Emergency Use Authorization (EUA). This EUA will remain in effect (meaning this test can be used) for the duration of the COVID-19 declaration under Section 564(b)(1) of the Act, 21 U.S.C. section 360bbb-3(b)(1), unless the authorization is terminated or revoked.  Performed at [facility], [address] [place], [place] [address]       Scheduled Meds:  amiodarone  200 mg Oral BID   apixaban  5 mg Oral BID   donepezil  5 mg Oral QHS   ezetimibe  10 mg Oral Daily   furosemide  40 mg Oral BID   levothyroxine  50 mcg Oral Q0600   meloxicam  15 mg Oral Daily   mometasone-formoterol  2 puff Inhalation BID   pantoprazole  40 mg Oral BID   pravastatin  40 mg Oral QHS   predniSONE  5 mg Oral Q breakfast   sodium chloride flush  3 mL Intravenous Q12H   tamoxifen  20 mg Oral Daily   ursodiol  600 mg Oral BID   venlafaxine XR  37.5 mg Oral Daily   Brief history: Patient admitted on 04/19/2020 with shortness of breath with  shortness of breath and admitted for COPD exacerbation.  Had a ventricular fibrillation cardiac arrest requiring CPR.  Patient was seen by cardiology and they recommended outpatient follow-up.  Patient was started on amiodarone.  The patient is very weak and physical therapy recommending rehab.  The patient was also over diuresed and Lasix was held for couple days with creatinine up at 1.76.  I restarted oral Lasix on 04/25/2021.  Rechecking creatinine tomorrow morning.  Past medical history of atrial fibrillation, B12 deficiency, breast cancer, CHF, chronic kidney disease, primary biliary cholangitis, hypothyroidism.  Patient awaiting insurance authorization to go out to rehab but unfortunately did not get on 04/26/2021.  Assessment/Plan:  Ventricular fibrillation cardiac arrest.  Patient has chronic atrial fibrillation.  Continue oral amiodarone and Eliquis at this point.  Soreness of the chest secondary to CPR. Acute kidney injury on chronic kidney disease stage IIIa.  Creatinine went as high as 1.76.  Back down to 1.51 today.  Has been as low as 1.08 during the hospital course.  Restarting oral Lasix. Acute on chronic diastolic congestive heart failure.  Held Lasix for 2 days with elevation in creatinine.  Restart oral Lasix today. Chronic atrial fibrillation/flutter patient has a  pacemaker.  On oral amiodarone.  Eliquis for anticoagulation. Acute on chronic hypoxic respiratory failure.  Pulse ox 85% on 2 L today.  Will need oxygen at home. 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  Physical therapy recommending rehab.  Did not hear back from transitional care team today about insurance authorization        Code Status:     Code Status Orders  (From admission, onward)           Start     Ordered   04/19/21 2356  Full code  Continuous        04/19/21 2356           Code Status History     Date Active Date Inactive Code Status Order ID Comments User Context   04/04/2021 1329 04/07/2021 2301 Full Code [id number]  Ivor Costa, MD ED   03/10/2020 1753 03/15/2020 2314 Full Code [id number]  Amie Portland, MD Inpatient   01/07/2018 1305 01/08/2018 1741 Full Code [id number]  Leim Fabry, MD Inpatient   06/07/2016 0710 06/07/2016 0937 Full Code [id number]  Mikael Spray, NP ED   03/22/2016 1232 03/24/2016 1432 Full Code [id number]  Theodoro Grist, MD Inpatient   05/02/2015 1443 05/03/2015 2003 Full Code [id number]  Vaughan Basta, MD Inpatient      Advance Directive Documentation    Flowsheet Row Most Recent Value  Type of Advance Directive Healthcare Power of Attorney, Living will  Pre-existing out of facility DNR order (yellow form or pink MOST form) --  "MOST" Form in Place? --      Family Communication: Left message for daughter on the phone Disposition Plan: Status is: Inpatient  Dispo: The patient is from: Home              Anticipated d/c is to: Rehab              Patient currently awaiting insurance authorization to go out to rehab since it is Friday afternoon, this likely will not happen until Monday.   Difficult to place patient.  No.  Consultants: Cardiology  Time spent: 27 minutes  [place]

## 2021-04-26 DIAGNOSIS — I482 Chronic atrial fibrillation, unspecified: Secondary | ICD-10-CM | POA: Diagnosis not present

## 2021-04-26 DIAGNOSIS — I5033 Acute on chronic diastolic (congestive) heart failure: Secondary | ICD-10-CM | POA: Diagnosis not present

## 2021-04-26 DIAGNOSIS — I509 Heart failure, unspecified: Secondary | ICD-10-CM | POA: Diagnosis not present

## 2021-04-26 DIAGNOSIS — N179 Acute kidney failure, unspecified: Secondary | ICD-10-CM | POA: Diagnosis not present

## 2021-04-26 LAB — COMPREHENSIVE METABOLIC PANEL
ALT: 84 U/L — ABNORMAL HIGH (ref 0–44)
AST: 44 U/L — ABNORMAL HIGH (ref 15–41)
Albumin: 3 g/dL — ABNORMAL LOW (ref 3.5–5.0)
Alkaline Phosphatase: 53 U/L (ref 38–126)
Anion gap: 11 (ref 5–15)
BUN: 46 mg/dL — ABNORMAL HIGH (ref 8–23)
CO2: 28 mmol/L (ref 22–32)
Calcium: 8.4 mg/dL — ABNORMAL LOW (ref 8.9–10.3)
Chloride: 95 mmol/L — ABNORMAL LOW (ref 98–111)
Creatinine, Ser: 1.61 mg/dL — ABNORMAL HIGH (ref 0.44–1.00)
GFR, Estimated: 33 mL/min — ABNORMAL LOW (ref >60)
Glucose, Bld: 88 mg/dL (ref 70–99)
Potassium: 4.1 mmol/L (ref 3.5–5.1)
Sodium: 134 mmol/L — ABNORMAL LOW (ref 135–145)
Total Bilirubin: 1 mg/dL (ref 0.3–1.2)
Total Protein: 6.1 g/dL — ABNORMAL LOW (ref 6.5–8.1)

## 2021-04-26 MED ORDER — FUROSEMIDE 40 MG PO TABS
40.0000 mg | ORAL_TABLET | Freq: Every day | ORAL | Status: DC
  Administered 2021-04-27 – 2021-04-28 (×2): 40 mg via ORAL
  Filled 2021-04-26 (×2): qty 1

## 2021-04-26 NOTE — Progress Notes (Signed)
Patient ID: Adriana Martin, female   DOB: 1945/09/25, 75 y.o.   MRN: [id number] [facility] PROGRESS NOTE  Adriana Martin [id number] DOB: 08-09-1946 DOA: 04/19/2021 PCP: [name], MD (Inactive)  Brief history: Patient admitted on 04/19/2020 with shortness of breath with shortness of breath and admitted for COPD exacerbation.  Had a ventricular fibrillation cardiac arrest requiring CPR.  Patient was seen by cardiology and they recommended outpatient follow-up.  Patient was started on amiodarone.  The patient is very weak and physical therapy recommending rehab.  The patient was also over diuresed and Lasix was held for couple days with creatinine up at 1.76.  I restarted oral Lasix on 04/25/2021.  Rechecking creatinine tomorrow morning.  Past medical history of atrial fibrillation, B12 deficiency, breast cancer, CHF, chronic kidney disease, primary biliary cholangitis, hypothyroidism.  Patient awaiting insurance authorization to go out to rehab but unfortunately did not get on 04/26/2021, most likely will be done on Monday now. Cardiology decrease the dose of amiodarone to 200 mg twice daily. Also decreasing the dose of Lasix to once daily.  Assessment/Plan:  Ventricular fibrillation cardiac arrest.  Patient has chronic atrial fibrillation.  Continue oral amiodarone and Eliquis at this point.  Soreness of the chest secondary to CPR.  Amiodarone dose was decreased to 200 mg twice daily by cardiology today Acute kidney injury on chronic kidney disease stage IIIa.   Has been as low as 1.08 during the hospital course.  Her Lasix was initially held due to increasing creatinine which resulted in some improvement, creatinine with slight increase again today after restarting Lasix.  Will hold Lasix for today and then restart with once daily dose from tomorrow instead of twice. Acute on chronic diastolic congestive heart failure.  Lasix was held for 2 days with resulted in mild improvement in  creatinine, another creatinine increase after restarting twice daily p.o. dose.  Dose will be decreased to once a day from tomorrow. Chronic atrial fibrillation/flutter patient has a pacemaker.  On oral amiodarone.  Eliquis for anticoagulation. Acute on chronic hypoxic respiratory failure.  Pulse ox 85% on 2 L today.  Will need oxygen at home. 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  Physical therapy recommending rehab.  Waiting for insurance authorization.  HPI/Subjective: Patient was seen and examined today.  No new complaints.  Sitting in chair.  Denies any chest pain or palpitations.  Objective: Vitals:   04/26/21 0423 04/26/21 0803  BP: (!) 132/59 131/74  Pulse: (!) 59 60  Resp: 14 16  Temp:  98.6 F (37 C)  SpO2: 100% 100%    Intake/Output Summary (Last 24 hours) at 04/26/2021 1538 Last data filed at 04/26/2021 1021 Gross per 24 hour  Intake 720 ml  Output 800 ml  Net -80 ml    Filed Weights   04/24/21 0115 04/25/21 0240 04/26/21 0500  Weight: 76.6 kg 77.2 kg 77.4 kg    Exam: General.  Well-developed elderly lady, in no acute distress. Pulmonary.  Lungs clear bilaterally, normal respiratory effort. CV.  Regular rate and rhythm, no JVD, rub or murmur. Abdomen.  Soft, nontender, nondistended, BS positive. CNS.  Alert and oriented.  No focal neurologic deficit. Extremities.  No edema, no cyanosis, pulses intact and symmetrical. Psychiatry.  Judgment and insight appears normal.   Data Reviewed: Basic Metabolic Panel: Recent Labs  Lab 04/20/21 1114 04/20/21 1848 04/21/21 0546 04/22/21 0715 04/23/21 0503 04/24/21  [id number] 04/25/21 0529 04/26/21 0438  NA 136  --  138 135 131* 135 132* 134*  K 3.4* 5.1 4.6 4.2 4.0 4.6 4.9 4.1  CL 96*  --  98 98 93* 93* 96* 95*  CO2 24  --  '28 27 30 31 28 28  '$ GLUCOSE 181*  --  115* 101* 98 96 111*  88  BUN 21  --  28* 28* 39* 40* 46* 46*  CREATININE 1.33*  --  1.42* 1.40* 1.76* 1.59* 1.51* 1.61*  CALCIUM 8.6*  --  8.9 8.6* 8.9 9.4 8.9 8.4*  MG 1.9 2.2 2.2 1.9 2.0 2.0  --   --   PHOS 4.1  --  3.8 2.9 4.0 3.9  --   --     Liver Function Tests: Recent Labs  Lab 04/20/21 0345 04/20/21 1114 04/24/21 0548 04/26/21 0438  AST 117* 181* 73* 44*  ALT 159* 191* 145* 84*  ALKPHOS 60 61 58 53  BILITOT 0.9 0.9 0.8 1.0  PROT 6.7 6.5 6.7 6.1*  ALBUMIN 3.1* 2.9* 3.2* 3.0*    CBC: Recent Labs  Lab 04/19/21 2152 04/21/21 0546 04/23/21 0503  WBC 9.6 13.9* 11.5*  HGB 12.1 10.1* 10.6*  HCT 39.6 31.3* 32.1*  MCV 98.0 93.7 92.5  PLT 202 191 232     BNP (last 3 results) Recent Labs    04/04/21 1329 04/19/21 2152  BNP 268.0* 558.3*      CBG: Recent Labs  Lab 04/20/21 1034  GLUCAP 180*     Recent Results (from the past 240 hour(s))  Resp Panel by RT-PCR (Flu A&B, Covid) Nasopharyngeal Swab     Status: None   Collection Time: 04/19/21 10:23 PM   Specimen: Nasopharyngeal Swab; Nasopharyngeal(NP) swabs in vial transport medium  Result Value Ref Range Status   SARS Coronavirus 2 by RT PCR NEGATIVE NEGATIVE Final    Comment: (NOTE) SARS-CoV-2 target nucleic acids are NOT DETECTED.  The SARS-CoV-2 RNA is generally detectable in upper respiratory specimens during the acute phase of infection. The lowest concentration of SARS-CoV-2 viral copies this assay can detect is 138 copies/mL. A negative result does not preclude SARS-Cov-2 infection and should not be used as the sole basis for treatment or other patient management decisions. A negative result may occur with  improper specimen collection/handling, submission of specimen other than nasopharyngeal swab, presence of viral mutation(s) within the areas targeted by this assay, and inadequate number of viral copies(<138 copies/mL). A negative result must be combined with clinical observations, patient history, and  epidemiological information. The expected result is Negative.  Fact Sheet for Patients:  EntrepreneurPulse.com.au  Fact Sheet for Healthcare Providers:  IncredibleEmployment.be  This test is no t yet approved or cleared by the [place] FDA and  has been authorized for detection and/or diagnosis of SARS-CoV-2 by FDA under an Emergency Use Authorization (EUA). This EUA will remain  in effect (meaning this test can be used) for the duration of the COVID-19 declaration under Section 564(b)(1) of the Act, 21 U.S.C.section 360bbb-3(b)(1), unless the authorization is terminated  or revoked sooner.       Influenza A by PCR NEGATIVE NEGATIVE Final   Influenza B by PCR NEGATIVE NEGATIVE Final    Comment: (NOTE) The Xpert Xpress SARS-CoV-2/FLU/RSV plus assay is intended as an aid in the diagnosis of influenza from Nasopharyngeal swab specimens and should not be used as a sole basis for treatment. Nasal washings and aspirates are unacceptable for Xpert Xpress SARS-CoV-2/FLU/RSV testing.  Fact Sheet for Patients: EntrepreneurPulse.com.au  Fact Sheet for Healthcare Providers: IncredibleEmployment.be  This test is not yet approved or cleared by the [place] FDA and has been authorized for detection and/or diagnosis of SARS-CoV-2 by FDA under an Emergency Use Authorization (EUA). This EUA will remain in effect (meaning this test can be used) for the duration of the COVID-19 declaration under Section 564(b)(1) of the Act, 21 U.S.C. section 360bbb-3(b)(1), unless the authorization is terminated or revoked.  Performed at [facility], [address] [place], [place] [address]        Scheduled Meds:  amiodarone  200 mg Oral BID   apixaban  5 mg Oral BID   donepezil  5 mg Oral QHS   ezetimibe  10 mg Oral Daily   [START ON 04/27/2021] furosemide  40 mg Oral Daily   levothyroxine  50 mcg Oral  Q0600   meloxicam  15 mg Oral Daily   mometasone-formoterol  2 puff Inhalation BID   pantoprazole  40 mg Oral BID   pravastatin  40 mg Oral QHS   predniSONE  5 mg Oral Q breakfast   sodium chloride flush  3 mL Intravenous Q12H   tamoxifen  20 mg Oral Daily   ursodiol  600 mg Oral BID   venlafaxine XR  37.5 mg Oral Daily     Code Status:     Code Status Orders  (From admission, onward)           Start     Ordered   04/19/21 2356  Full code  Continuous        04/19/21 2356           Code Status History     Date Active Date Inactive Code Status Order ID Comments User Context   04/04/2021 1329 04/07/2021 2301 Full Code [id number]  Ivor Costa, MD ED   03/10/2020 1753 03/15/2020 2314 Full Code [id number]  Amie Portland, MD Inpatient   01/07/2018 1305 01/08/2018 1741 Full Code [id number]  Leim Fabry, MD Inpatient   06/07/2016 0710 06/07/2016 0937 Full Code [id number]  Mikael Spray, NP ED   03/22/2016 1232 03/24/2016 1432 Full Code [id number]  Theodoro Grist, MD Inpatient   05/02/2015 1443 05/03/2015 2003 Full Code [id number]  Vaughan Basta, MD Inpatient      Advance Directive Documentation    Flowsheet Row Most Recent Value  Type of Advance Directive Healthcare Power of Attorney, Living will  Pre-existing out of facility DNR order (yellow form or pink MOST form) --  "MOST" Form in Place? --      Family Communication: Daughter was updated on phone. Disposition Plan: Status is: Inpatient  Dispo: The patient is from: Home              Anticipated d/c is to: Rehab              Patient currently awaiting insurance authorization to go out to rehab , this likely will not happen until Monday.   Difficult to place patient.  No.  Consultants: Cardiology  Time spent: 32 minutes  This record has been created using Systems analyst. Errors have been sought and corrected,but may not always be located. Such creation errors do not reflect on the standard of  care.   [facility]

## 2021-04-26 NOTE — Progress Notes (Signed)
Physical Therapy Treatment Patient Details Name: Adriana Martin MRN: [id number] DOB: 04/19/1946 Today's Date: 04/26/2021    History of Present Illness Pt is a 75 y/o F admitted on 04/19/21 with c/o SOB. Pt was admitted for CHF exacerbation. On 04/20/21 pt had v tach cardiac arrest & CPR & intubation with transfer to ICU. Pt extubated later that day. PMH: HTN, HLD, hypothyroidism, hx of heart block s/p permanent pace maker, COPD, CKD3B, a-fib, heart failure, breast CA, cirrhosis, dementia    PT Comments    Agrees to session.  To EOB with min/mod a x1 with mod cues for sequencing and hand placements.  She is able to stand and is noted to be inc BM.  Commode obtained and she tranfers with min a x 1 and is able to have a large soft BM.  Care provided then walks a few steps to recliner.  While pt stated she wants to get home to her dog Adriana Martin she needs mod encouragement to complete tasks.  She turns and sits quickly in chair after tasks and seems generally fatigued.  Needs met and alarm on.   Follow Up Recommendations  SNF     Equipment Recommendations       Recommendations for Other Services       Precautions / Restrictions Precautions Precautions: Fall Restrictions Weight Bearing Restrictions: No    Mobility  Bed Mobility Overal bed mobility: Needs Assistance Bed Mobility: Supine to Sit     Supine to sit: Min assist;HOB elevated          Transfers Overall transfer level: Needs assistance Equipment used: Rolling walker (2 wheeled) Transfers: Sit to/from Stand Sit to Stand: Min guard;Min assist            Ambulation/Gait Ambulation/Gait assistance: Min guard Gait Distance (Feet): 5 Feet Assistive device: Rolling walker (2 wheeled) Gait Pattern/deviations: Step-through pattern;Trunk flexed Gait velocity: decreased   General Gait Details: few steps to comode then to Dentist    Modified Rankin (Stroke  Patients Only)       Balance Overall balance assessment: Needs assistance   Sitting balance-Leahy Scale: Good       Standing balance-Leahy Scale: Fair                              Cognition Arousal/Alertness: Awake/alert Behavior During Therapy: WFL for tasks assessed/performed Overall Cognitive Status: History of cognitive impairments - at baseline                                        Exercises Other Exercises Other Exercises: to [place] for BM    General Comments        Pertinent Vitals/Pain Pain Assessment: No/denies pain    Home Living                      Prior Function            PT Goals (current goals can now be found in the care plan section) Progress towards PT goals: Progressing toward goals    Frequency    Min 2X/week      PT Plan Current plan remains appropriate    Co-evaluation  AM-PAC PT "6 Clicks" Mobility   Outcome Measure  Help needed turning from your back to your side while in a flat bed without using bedrails?: A Little Help needed moving from lying on your back to sitting on the side of a flat bed without using bedrails?: A Little Help needed moving to and from a bed to a chair (including a wheelchair)?: A Little Help needed standing up from a chair using your arms (e.g., wheelchair or bedside chair)?: A Little Help needed to walk in hospital room?: A Little Help needed climbing 3-5 steps with a railing? : A Lot 6 Click Score: 17    End of Session Equipment Utilized During Treatment: Gait belt Activity Tolerance: Patient limited by fatigue Patient left: in chair;with call bell/phone within reach;with chair alarm set Nurse Communication: Mobility status;Other (comment) PT Visit Diagnosis: Difficulty in walking, not elsewhere classified (R26.2);Muscle weakness (generalized) (M62.81);Unsteadiness on feet (R26.81)     Time: 6190-1222 PT Time Calculation (min) (ACUTE  ONLY): 19 min  Charges:  $Therapeutic Activity: 8-22 mins                    Chesley Noon, PTA 04/26/21, 10:28 AM

## 2021-04-26 NOTE — Progress Notes (Signed)
[facility] Cardiology [facility] Encounter Note  Patient: Adriana Martin / Admit Date: 04/19/2021 / Date of Encounter: 04/26/2021, 7:16 AM   Subjective: 8/5.  Patient still somewhat short of breath due to significant decrease in exercise tolerance and recent illness with slow improvements.  Patient had ventricular tachycardia of unknown etiology with normal LV systolic function and heart failure with preserved ejection fraction.  Possibly secondary to circumstances and or low potassium now corrected.  Patient was placed on amiodarone and currently no evidence of recurrent ventricular tachycardia.  Atrial fibrillation has been chronic with good heart rate control  8/6.  No evidence of rhythm disturbances other than chronic atrial fibrillation with ventricular pacing.  No evidence of short runs of ventricular tachycardia status post arrest.  Patient has tolerated current physical therapy to the chair with no evidence of cardiovascular symptoms.  Review of Systems: Positive for: Shortness of breath Negative for: Vision change, hearing change, syncope, dizziness, nausea, vomiting,diarrhea, bloody stool, stomach pain, cough, congestion, diaphoresis, urinary frequency, urinary pain,skin lesions, skin rashes Others previously listed  Objective: Telemetry: Atrial fibrillation Physical Exam: Blood pressure (!) 132/59, pulse (!) 59, temperature 97.9 F (36.6 C), resp. rate 14, height '5\' 2"'$  (1.575 m), weight 77.4 kg, SpO2 100 %. Body mass index is 31.21 kg/m. General: Well developed, well nourished, in no acute distress. Head: Normocephalic, atraumatic, sclera non-icteric, no xanthomas, nares are without discharge. Neck: No apparent masses Lungs: Normal respirations with no wheezes, no rhonchi, no rales , no crackles   Heart: Irregular rate and rhythm, normal S1 S2, no murmur, no rub, no gallop, PMI is normal size and placement, carotid upstroke normal without bruit, jugular venous pressure  normal Abdomen: Soft, non-tender, non-distended with normoactive bowel sounds. No hepatosplenomegaly. Abdominal aorta is normal size without bruit Extremities: No edema, no clubbing, no cyanosis, no ulcers,  Peripheral: 2+ radial, 2+ femoral, 2+ dorsal pedal pulses Neuro: Alert and oriented. Moves all extremities spontaneously. Psych:  Responds to questions appropriately with a normal affect.   Intake/Output Summary (Last 24 hours) at 04/26/2021 0716 Last data filed at 04/25/2021 2200 Gross per 24 hour  Intake 480 ml  Output 800 ml  Net -320 ml     Inpatient Medications:   amiodarone  200 mg Oral BID   apixaban  5 mg Oral BID   donepezil  5 mg Oral QHS   ezetimibe  10 mg Oral Daily   furosemide  40 mg Oral BID   levothyroxine  50 mcg Oral Q0600   meloxicam  15 mg Oral Daily   mometasone-formoterol  2 puff Inhalation BID   pantoprazole  40 mg Oral BID   pravastatin  40 mg Oral QHS   predniSONE  5 mg Oral Q breakfast   sodium chloride flush  3 mL Intravenous Q12H   tamoxifen  20 mg Oral Daily   ursodiol  600 mg Oral BID   venlafaxine XR  37.5 mg Oral Daily   Infusions:   Labs: Recent Labs    04/24/21 0548 04/25/21 0529 04/26/21 0438  NA 135 132* 134*  K 4.6 4.9 4.1  CL 93* 96* 95*  CO2 '31 28 28  '$ GLUCOSE 96 111* 88  BUN 40* 46* 46*  CREATININE 1.59* 1.51* 1.61*  CALCIUM 9.4 8.9 8.4*  MG 2.0  --   --   PHOS 3.9  --   --     Recent Labs    04/24/21 0548 04/26/21 0438  AST 73* 44*  ALT 145*  84*  ALKPHOS 58 53  BILITOT 0.8 1.0  PROT 6.7 6.1*  ALBUMIN 3.2* 3.0*    No results for input(s): WBC, NEUTROABS, HGB, HCT, MCV, PLT in the last 72 hours.  No results for input(s): CKTOTAL, CKMB, TROPONINI in the last 72 hours. Invalid input(s): POCBNP No results for input(s): HGBA1C in the last 72 hours.   Weights: Filed Weights   04/24/21 0115 04/25/21 0240 04/26/21 0500  Weight: 76.6 kg 77.2 kg 77.4 kg     Radiology/Studies:  DG Chest 2 View  Result Date:  04/19/2021 CLINICAL DATA:  Shortness of breath for several days. Hypoxia. EXAM: CHEST - 2 VIEW COMPARISON:  06/08/2016 FINDINGS: Moderate cardiomegaly shows no significant change. 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 A new tiny right pleural effusion is seen. No evidence of acute pulmonary edema or other infiltrate. Calcified granuloma again seen in the right lower lung. Right shoulder prosthesis also noted. IMPRESSION: Stable cardiomegaly. New tiny right pleural effusion. Electronically Signed   By: Marlaine Hind M.D.   On: 04/19/2021 21:10   ECHOCARDIOGRAM COMPLETE  Result Date: 04/05/2021    ECHOCARDIOGRAM REPORT   Patient Name:   Adriana Martin Date of Exam: 04/05/2021 Medical Rec #:  [id number]            Height:       62.0 in Accession #:    [id number]           Weight:       164.0 lb Date of Birth:  1946/07/26            BSA:          1.757 m Patient Age:    75 years             BP:           109/51 mmHg Patient Gender: F                    HR:           67 bpm. Exam Location:  [facility] Procedure: 2D Echo Indications:     Atrial Flutter I48.92  History:         Patient has no prior history of Echocardiogram examinations.  Sonographer:     Kathlen Brunswick RDCS Referring Phys:  [id number] [place] Diagnosing Phys: Skeet Latch MD IMPRESSIONS  1. Left ventricular ejection fraction, by estimation, is 60 to 65%. The left ventricle has normal function. The left ventricle has no regional wall motion abnormalities. Left ventricular diastolic parameters are indeterminate.  2. Right ventricular systolic function is normal. The right ventricular size is normal. There is normal pulmonary artery systolic pressure.  3. Left atrial size was severely dilated.  4. Right atrial size was severely dilated.  5. Compared with the echo 02/2020, mean bioprosthetic mitral valve gradient has increased from 10 mmHg to 16 mmHg.  Visually the valve appears unchanged from 02/2020. Gradients may be elevated in the setting of high flow and sepsis. No increased thickness or vegetations concerning for endocarditis. The mitral valve has been repaired/replaced. No evidence of mitral valve regurgitation. Severe mitral stenosis. Echo findings are consistent with normal structure and function of the mitral valve prosthesis.  6. The aortic valve is tricuspid. Aortic valve regurgitation is trivial. No aortic stenosis is present.  7. The inferior vena cava is normal in size with greater than 50%  respiratory variability, suggesting right atrial pressure of 3 mmHg. FINDINGS  Left Ventricle: Left ventricular ejection fraction, by estimation, is 60 to 65%. The left ventricle has normal function. The left ventricle has no regional wall motion abnormalities. The left ventricular internal cavity size was normal in size. There is  no left ventricular hypertrophy. Left ventricular diastolic parameters are indeterminate. Right Ventricle: The right ventricular size is normal. No increase in right ventricular wall thickness. Right ventricular systolic function is normal. There is normal pulmonary artery systolic pressure. The tricuspid regurgitant velocity is 2.35 m/s, and  with an assumed right atrial pressure of 3 mmHg, the estimated right ventricular systolic pressure is [id number] mmHg. Left Atrium: Left atrial size was severely dilated. Right Atrium: Right atrial size was severely dilated. Pericardium: There is no evidence of pericardial effusion. Mitral Valve: Compared with the echo 02/2020, mean bioprosthetic mitral valve gradient has increased from 10 mmHg to 16 mmHg. Visually the valve appears unchanged from 02/2020. Gradients may be elevated in the setting of high flow and sepsis. No increased thickness or vegetations concerning for endocarditis. The mitral valve has been repaired/replaced. No evidence of mitral valve regurgitation. Echo findings are consistent  with normal structure and function of the mitral valve prosthesis. Severe mitral valve stenosis. MV peak gradient, 38.4 mmHg. The mean mitral valve gradient is 16.0 mmHg. Tricuspid Valve: The tricuspid valve is normal in structure. Tricuspid valve regurgitation is trivial. No evidence of tricuspid stenosis. Aortic Valve: The aortic valve is tricuspid. Aortic valve regurgitation is trivial. No aortic stenosis is present. Aortic valve mean gradient measures 6.0 mmHg. Aortic valve peak gradient measures 13.7 mmHg. Aortic valve area, by VTI measures 1.44 cm. Pulmonic Valve: The pulmonic valve was normal in structure. Pulmonic valve regurgitation is not visualized. No evidence of pulmonic stenosis. Aorta: The aortic root is normal in size and structure. Venous: The inferior vena cava is normal in size with greater than 50% respiratory variability, suggesting right atrial pressure of 3 mmHg. IAS/Shunts: No atrial level shunt detected by color flow Doppler.  LEFT VENTRICLE PLAX 2D LVIDd:         4.30 cm  Diastology LVIDs:         3.00 cm  LV e' medial:    4.35 cm/s LV PW:         1.40 cm  LV E/e' medial:  49.4 LV IVS:        1.00 cm  LV e' lateral:   4.68 cm/s LVOT diam:     1.80 cm  LV E/e' lateral: 45.9 LV SV:         43 LV SV Index:   25 LVOT Area:     2.54 cm  LEFT ATRIUM           Index       RIGHT ATRIUM           Index LA diam:      4.80 cm 2.73 cm/m  RA Area:     23.50 cm LA Vol (A2C): 32.1 ml 18.27 ml/m RA Volume:   77.90 ml  44.33 ml/m LA Vol (A4C): 71.0 ml 40.41 ml/m  AORTIC VALVE                    PULMONIC VALVE AV Area (Vmax):    1.22 cm     PV Vmax:       0.91 m/s AV Area (Vmean):   1.28 cm     PV Peak grad:  3.3 mmHg AV Area (VTI):     1.44 cm AV Vmax:           185.00 cm/s AV Vmean:          114.000 cm/s AV VTI:            0.300 m AV Peak Grad:      13.7 mmHg AV Mean Grad:      6.0 mmHg LVOT Vmax:         88.70 cm/s LVOT Vmean:        57.200 cm/s LVOT VTI:          0.170 m LVOT/AV VTI ratio: 0.57   AORTA Ao Root diam: 2.90 cm Ao Asc diam:  3.20 cm MITRAL VALVE                TRICUSPID VALVE MV Area (PHT): 0.96 cm     TV Peak grad:   25.8 mmHg MV Area VTI:   0.32 cm     TV Vmax:        2.54 m/s MV Peak grad:  38.4 mmHg    TR Peak grad:   22.1 mmHg MV Mean grad:  16.0 mmHg    TR Vmax:        235.00 cm/s MV Vmax:       3.10 m/s MV Vmean:      184.0 cm/s   SHUNTS MV Decel Time: 789 msec     Systemic VTI:  0.17 m MV E velocity: 215.00 cm/s  Systemic Diam: 1.80 cm Skeet Latch MD Electronically signed by Skeet Latch MD Signature Date/Time: 04/05/2021/1:33:45 PM    Final    CT Angio Abd/Pel w/ and/or w/o  Result Date: 04/04/2021 CLINICAL DATA:  Rectal bleeding, Dementia, recent fall EXAM: CTA ABDOMEN AND PELVIS WITHOUT AND WITH CONTRAST TECHNIQUE: Multidetector CT imaging of the abdomen and pelvis was performed using the standard protocol during bolus administration of intravenous contrast. Multiplanar reconstructed images and MIPs were obtained and reviewed to evaluate the vascular anatomy. CONTRAST:  61m OMNIPAQUE IOHEXOL 350 MG/ML SOLN COMPARISON:  03/20/2013 FINDINGS: VASCULAR Aorta: Moderate calcified atheromatous plaque. No aneurysm, dissection, or stenosis. Celiac: Calcified ostial plaque without stenosis, patent distally within remarkable branch anatomy. SMA: Ostial plaque extending over length of approximately 1.6 cm resulting in moderate stenosis. Vessels patent distally with classic branch anatomy. Renals: Single left, with proximal branching. No stenosis. Duplicated right, superior dominant, both patent. IMA: Patent without evidence of aneurysm, dissection, vasculitis or significant stenosis. Inflow: Mild plaque in bilateral common iliac arteries without stenosis. No dissection or aneurysm. Proximal Outflow: Bilateral common femoral and visualized portions of the superficial and profunda femoral arteries are patent without evidence of aneurysm, dissection, vasculitis or significant  stenosis. Veins: Patent hepatic veins, portal vein, SMV, splenic vein, bilateral renal veins. Scattered mural calcifications in right common femoral and common iliac veins suggesting sequela of previous central venous catheter or DVT. IVC is nondistended, unremarkable. Review of the MIP images confirms the above findings. NON-VASCULAR Lower chest: Cardiomegaly with biatrial enlargement. Transvenous pacing leads partially visualized. Mitral valve replacement. No pleural or pericardial effusion. Hepatobiliary: No focal liver abnormality is seen. No gallstones, gallbladder wall thickening, or biliary dilatation. Pancreas: Unremarkable. No pancreatic ductal dilatation or surrounding inflammatory changes. Spleen: Normal in size without focal abnormality. Adrenals/Urinary Tract: Adrenal glands unremarkable. Symmetric renal enhancement. No hydronephrosis or urolithiasis. Urinary bladder incompletely distended. Stomach/Bowel: Stomach and small bowel are nondistended. Appendix not identified. No pericecal inflammatory change. The colon is  nondilated. Suspect mild circumferential wall thickening in the splenic flexure with mild adjacent inflammatory change. Scattered sigmoid diverticula without adjacent inflammatory change. No evidence of active extravasation. Lymphatic: No abdominal or pelvic adenopathy. Reproductive: Status post hysterectomy. No adnexal masses. Other: Bilateral pelvic phleboliths.  No ascites.  No free air. Musculoskeletal: Chronic T8 and L1 vertebral body compression deformities stable since 07/27/2019. Stable grade 1 anterolisthesis L4-5 without pars defect. No acute fracture or aggressive bone lesion. IMPRESSION: 1. No evidence of active GI bleeding. 2. Wall thickening in the splenic flexure of the colon suggesting colitis, nonspecific. Consider GI consultation. 3. Sigmoid diverticulosis Electronically Signed   By: Lucrezia Europe M.D.   On: 04/04/2021 12:02     Assessment and Recommendation  75 y.o.  female with known heart failure with preserved ejection fraction atrial fibrillation hypertension hyperlipidemia with acute illness and sepsis status post ventricular tachycardia of unknown primary etiology now controlled with amiodarone and slowly recovering 1.  Continue amiodarone and decreasing dose to 200 mg twice per day and follow-up for significant side effects in office 2.  No further cardiac diagnostics necessary at this time 3.  Begin ambulation and follow-up for improvement of symptoms and possible discharge to nursing facility with follow-up in 1 week for further adjustments of medication management  Signed, Serafina Royals M.D. FACC

## 2021-04-26 NOTE — TOC Progression Note (Signed)
Transition of Care [facility]) - Progression Note    Patient Details  Name: Adriana Martin MRN: [id number] Date of Birth: [birth year]  Transition of Care [facility]) CM/SW [place], [place] Phone Number: 04/26/2021, 10:02 AM  Clinical Narrative:     Pending insurance auth approval for patient to go to [facility]. Patient's daughter [name] updated.  Expected Discharge Plan: Skilled Nursing Facility Barriers to Discharge: Continued Medical Work up  Expected Discharge Plan and Services Expected Discharge Plan: [place] Choice: [name] arrangements for the past 2 months: Single Family Home                                       Social Determinants of Health (SDOH) Interventions    Readmission Risk Interventions Readmission Risk Prevention Plan 04/06/2021  Transportation Screening Complete  PCP or Specialist Appt within 3-5 Days Complete  HRI or [place] Complete  Social Work Consult for [place] Planning/Counseling Complete  Palliative Care Screening Complete  Medication Review Press photographer) Complete  Some recent data might be hidden

## 2021-04-27 DIAGNOSIS — N179 Acute kidney failure, unspecified: Secondary | ICD-10-CM | POA: Diagnosis not present

## 2021-04-27 DIAGNOSIS — I5033 Acute on chronic diastolic (congestive) heart failure: Secondary | ICD-10-CM | POA: Diagnosis not present

## 2021-04-27 DIAGNOSIS — I509 Heart failure, unspecified: Secondary | ICD-10-CM | POA: Diagnosis not present

## 2021-04-27 DIAGNOSIS — J9601 Acute respiratory failure with hypoxia: Secondary | ICD-10-CM | POA: Diagnosis not present

## 2021-04-27 LAB — BASIC METABOLIC PANEL
Anion gap: 10 (ref 5–15)
BUN: 46 mg/dL — ABNORMAL HIGH (ref 8–23)
CO2: 30 mmol/L (ref 22–32)
Calcium: 8.2 mg/dL — ABNORMAL LOW (ref 8.9–10.3)
Chloride: 92 mmol/L — ABNORMAL LOW (ref 98–111)
Creatinine, Ser: 1.65 mg/dL — ABNORMAL HIGH (ref 0.44–1.00)
GFR, Estimated: 32 mL/min — ABNORMAL LOW (ref >60)
Glucose, Bld: 97 mg/dL (ref 70–99)
Potassium: 3.8 mmol/L (ref 3.5–5.1)
Sodium: 132 mmol/L — ABNORMAL LOW (ref 135–145)

## 2021-04-27 LAB — GLUCOSE, CAPILLARY: Glucose-Capillary: 83 mg/dL (ref 70–99)

## 2021-04-27 NOTE — Progress Notes (Signed)
PROGRESS NOTE    Adriana Martin  [id number] DOB: Feb 14, 1946 DOA: 04/19/2021 PCP: [name], MD (Inactive)    Brief Narrative:  Patient admitted on 04/19/2020 with shortness of breath with shortness of breath and admitted for COPD exacerbation.  Had a ventricular fibrillation cardiac arrest requiring CPR.  Patient was seen by cardiology and they recommended outpatient follow-up.  Patient was started on amiodarone.  The patient is very weak and physical therapy recommending rehab.  The patient was also over diuresed and Lasix was held for couple days with creatinine up at 1.76.  I restarted oral Lasix on 04/25/2021.  Rechecking creatinine tomorrow morning.  Past medical history of atrial fibrillation, B12 deficiency, breast cancer, CHF, chronic kidney disease, primary biliary cholangitis, hypothyroidism.  Patient awaiting insurance authorization to go out to rehab but unfortunately did not get on 04/26/2021, most likely will be done on Monday now. Cardiology decrease the dose of amiodarone to 200 mg twice daily. Also decreasing the dose of Lasix to once daily.      Consultants:  cardiology  Procedures:   Antimicrobials:      Subjective: Has no cp, sob, or any other complaints  Objective: Vitals:   04/27/21 0359 04/27/21 0805 04/27/21 1143 04/27/21 1524  BP: (!) 130/55 (!) 126/53 (!) 112/46 130/60  Pulse: 62 (!) 59 60 60  Resp: '17 18 19 17  '$ Temp: 98.6 F (37 C) 97.9 F (36.6 C) 97.6 F (36.4 C) 97.9 F (36.6 C)  TempSrc:      SpO2: 100% 100% 99% 100%  Weight: 78.1 kg     Height:        Intake/Output Summary (Last 24 hours) at 04/27/2021 1615 Last data filed at 04/27/2021 0511 Gross per 24 hour  Intake 240 ml  Output 600 ml  Net -360 ml   Filed Weights   04/25/21 0240 04/26/21 0500 04/27/21 0359  Weight: 77.2 kg 77.4 kg 78.1 kg    Examination:  General exam: Appears calm and comfortable  Respiratory system: Clear to auscultation. Respiratory effort  normal. Cardiovascular system: S1 & S2 heard, regular, no gallops  gastrointestinal system: Abdomen is nondistended, soft and nontender. Normal bowel sounds heard. Central nervous system: Grossly intact  Extremities: No edema Psychiatry: Mood & affect appropriate.     Data Reviewed: I have personally reviewed following labs and imaging studies  CBC: Recent Labs  Lab 04/21/21 0546 04/23/21 0503  WBC 13.9* 11.5*  HGB 10.1* 10.6*  HCT 31.3* 32.1*  MCV 93.7 92.5  PLT 191 [id number]   Basic Metabolic Panel: Recent Labs  Lab 04/20/21 1848 04/20/21 1848 04/21/21 0546 04/22/21 0715 04/23/21 0503 04/24/21 0548 04/25/21 0529 04/26/21 0438 04/27/21 0504  NA  --    < > 138 135 131* 135 132* 134* 132*  K 5.1  --  4.6 4.2 4.0 4.6 4.9 4.1 3.8  CL  --    < > 98 98 93* 93* 96* 95* 92*  CO2  --    < > '28 27 30 31 28 28 30  '$ GLUCOSE  --    < > 115* 101* 98 96 111* 88 97  BUN  --    < > 28* 28* 39* 40* 46* 46* 46*  CREATININE  --    < > 1.42* 1.40* 1.76* 1.59* 1.51* 1.61* 1.65*  CALCIUM  --    < > 8.9 8.6* 8.9 9.4 8.9 8.4* 8.2*  MG 2.2  --  2.2 1.9 2.0 2.0  --   --   --  PHOS  --   --  3.8 2.9 4.0 3.9  --   --   --    < > = values in this interval not displayed.   GFR: Estimated Creatinine Clearance: 28.5 mL/min (A) (by C-G formula based on SCr of 1.65 mg/dL (H)). Liver Function Tests: Recent Labs  Lab 04/24/21 0548 04/26/21 0438  AST 73* 44*  ALT 145* 84*  ALKPHOS 58 53  BILITOT 0.8 1.0  PROT 6.7 6.1*  ALBUMIN 3.2* 3.0*   No results for input(s): LIPASE, AMYLASE in the last 168 hours. No results for input(s): AMMONIA in the last 168 hours. Coagulation Profile: No results for input(s): INR, PROTIME in the last 168 hours. Cardiac Enzymes: No results for input(s): CKTOTAL, CKMB, CKMBINDEX, TROPONINI in the last 168 hours. BNP (last 3 results) No results for input(s): PROBNP in the last 8760 hours. HbA1C: No results for input(s): HGBA1C in the last 72 hours. CBG: Recent Labs   Lab 04/27/21 0812  GLUCAP 83   Lipid Profile: No results for input(s): CHOL, HDL, LDLCALC, TRIG, CHOLHDL, LDLDIRECT in the last 72 hours. Thyroid Function Tests: No results for input(s): TSH, T4TOTAL, FREET4, T3FREE, THYROIDAB in the last 72 hours. Anemia Panel: No results for input(s): VITAMINB12, FOLATE, FERRITIN, TIBC, IRON, RETICCTPCT in the last 72 hours. Sepsis Labs: Recent Labs  Lab 04/21/21 0546  LATICACIDVEN 1.5    Recent Results (from the past 240 hour(s))  Resp Panel by RT-PCR (Flu A&B, Covid) Nasopharyngeal Swab     Status: None   Collection Time: 04/19/21 10:23 PM   Specimen: Nasopharyngeal Swab; Nasopharyngeal(NP) swabs in vial transport medium  Result Value Ref Range Status   SARS Coronavirus 2 by RT PCR NEGATIVE NEGATIVE Final    Comment: (NOTE) SARS-CoV-2 target nucleic acids are NOT DETECTED.  The SARS-CoV-2 RNA is generally detectable in upper respiratory specimens during the acute phase of infection. The lowest concentration of SARS-CoV-2 viral copies this assay can detect is 138 copies/mL. A negative result does not preclude SARS-Cov-2 infection and should not be used as the sole basis for treatment or other patient management decisions. A negative result may occur with  improper specimen collection/handling, submission of specimen other than nasopharyngeal swab, presence of viral mutation(s) within the areas targeted by this assay, and inadequate number of viral copies(<138 copies/mL). A negative result must be combined with clinical observations, patient history, and epidemiological information. The expected result is Negative.  Fact Sheet for Patients:  EntrepreneurPulse.com.au  Fact Sheet for Healthcare Providers:  IncredibleEmployment.be  This test is no t yet approved or cleared by the [place] FDA and  has been authorized for detection and/or diagnosis of SARS-CoV-2 by FDA under an Emergency Use  Authorization (EUA). This EUA will remain  in effect (meaning this test can be used) for the duration of the COVID-19 declaration under Section 564(b)(1) of the Act, 21 U.S.C.section 360bbb-3(b)(1), unless the authorization is terminated  or revoked sooner.       Influenza A by PCR NEGATIVE NEGATIVE Final   Influenza B by PCR NEGATIVE NEGATIVE Final    Comment: (NOTE) The Xpert Xpress SARS-CoV-2/FLU/RSV plus assay is intended as an aid in the diagnosis of influenza from Nasopharyngeal swab specimens and should not be used as a sole basis for treatment. Nasal washings and aspirates are unacceptable for Xpert Xpress SARS-CoV-2/FLU/RSV testing.  Fact Sheet for Patients: EntrepreneurPulse.com.au  Fact Sheet for Healthcare Providers: IncredibleEmployment.be  This test is not yet approved or cleared by the [place]  States FDA and has been authorized for detection and/or diagnosis of SARS-CoV-2 by FDA under an Emergency Use Authorization (EUA). This EUA will remain in effect (meaning this test can be used) for the duration of the COVID-19 declaration under Section 564(b)(1) of the Act, 21 U.S.C. section 360bbb-3(b)(1), unless the authorization is terminated or revoked.  Performed at [facility], [address] [place], [place] [address]          Radiology Studies: No results found.      Scheduled Meds:  amiodarone  200 mg Oral BID   apixaban  5 mg Oral BID   donepezil  5 mg Oral QHS   ezetimibe  10 mg Oral Daily   furosemide  40 mg Oral Daily   levothyroxine  50 mcg Oral Q0600   meloxicam  15 mg Oral Daily   mometasone-formoterol  2 puff Inhalation BID   pantoprazole  40 mg Oral BID   pravastatin  40 mg Oral QHS   predniSONE  5 mg Oral Q breakfast   sodium chloride flush  3 mL Intravenous Q12H   tamoxifen  20 mg Oral Daily   ursodiol  600 mg Oral BID   venlafaxine XR  37.5 mg Oral Daily   Continuous  Infusions:  Assessment & Plan:   Principal Problem:   Acute exacerbation of CHF (congestive heart failure) (HCC) Active Problems:   Anxiety   Primary biliary cholangitis (HCC)   A-fib (HCC)   Hyperlipidemia   Acute kidney injury superimposed on CKD (HCC)   HTN (hypertension)   COPD (chronic obstructive pulmonary disease) (HCC)   HLD (hyperlipidemia)   Hypothyroid   Cirrhosis (HCC)   Breast cancer (HCC)   Dementia ([place])   CHF exacerbation (HCC)   Cardiac arrest ([place])   Epigastric pain   Weakness   Acute respiratory failure with hypoxia (HCC)   Ventricular fibrillation cardiac arrest.  Patient has chronic atrial fibrillation.: 8/7 continue amiodarone, Eliquis  Soreness of chest due to CPR  Will need to follow-up with cardiology for further dose adjustment as needed   Acute kidney injury on chronic kidney disease stage IIIa.    Has been as low as 1.08 during the hospital course.  Her Lasix was initially held due to increasing creatinine which resulted in some improvement, creatinine with slight increase again today after restarting Lasix. 8/7 Lasix being held and resume to once a day instead of twice a day on discharge Renal function stable currently  Acute on chronic diastolic congestive heart failure.   Improved clinically Continue Lasix as above   Chronic atrial fibrillation/flutter patient has a pacemaker.  Continue amiodarone and Eliquis   Acute on chronic hypoxic respiratory failure.   Pulse ox 85% on 2 L today.   8/7 encourage I-S  will need oxygen at home.  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  Physical therapy recommending rehab.  Waiting for insurance authorization.   DVT prophylaxis: Eliquis Code Status: Full Family Communication: None at bedside Disposition Plan:  Status is: Inpatient  Remains inpatient  appropriate because:Unsafe d/c plan  Dispo: The patient is from: Home              Anticipated d/c is to: SNF              Patient currently is medically stable to d/c.   Difficult to place patient No   Waiting for insurance  authorization.  Likely will happen on Monday         LOS: 7 days   Time spent: 35 minutes with more than 50% on [place], MD [facility] Pager [phone]  If 7PM-7AM, please contact night-coverage 04/27/2021, 4:15 PM

## 2021-04-27 NOTE — Progress Notes (Signed)
[facility] Cardiology [facility] Encounter Note  Patient: Adriana Martin / Admit Date: 04/19/2021 / Date of Encounter: 04/27/2021, 8:03 AM   Subjective: 8/5.  Patient still somewhat short of breath due to significant decrease in exercise tolerance and recent illness with slow improvements.  Patient had ventricular tachycardia of unknown etiology with normal LV systolic function and heart failure with preserved ejection fraction.  Possibly secondary to circumstances and or low potassium now corrected.  Patient was placed on amiodarone and currently no evidence of recurrent ventricular tachycardia.  Atrial fibrillation has been chronic with good heart rate control  8/6.  No evidence of rhythm disturbances other than chronic atrial fibrillation with ventricular pacing.  No evidence of short runs of ventricular tachycardia status post arrest.  Patient has tolerated current physical therapy to the chair with no evidence of cardiovascular symptoms.  8/7.  No particular changes in condition today.  Patient is tolerating current medical regimen for cardiovascular concerns listed below.  Minimal ambulation at this time.  Telemetry has shown atrial fibrillation with paced rate and controlled since episode of run of ventricular tachycardia Review of Systems: Positive for: Shortness of breath Negative for: Vision change, hearing change, syncope, dizziness, nausea, vomiting,diarrhea, bloody stool, stomach pain, cough, congestion, diaphoresis, urinary frequency, urinary pain,skin lesions, skin rashes Others previously listed  Objective: Telemetry: Atrial fibrillation with ventricular pacing Physical Exam: Blood pressure (!) 130/55, pulse 62, temperature 98.6 F (37 C), resp. rate 17, height '5\' 2"'$  (1.575 m), weight 78.1 kg, SpO2 100 %. Body mass index is 31.48 kg/m. General: Well developed, well nourished, in no acute distress. Head: Normocephalic, atraumatic, sclera non-icteric, no xanthomas, nares are  without discharge. Neck: No apparent masses Lungs: Normal respirations with no wheezes, no rhonchi, no rales , no crackles   Heart: Irregular rate and rhythm, normal S1 S2, no murmur, no rub, no gallop, PMI is normal size and placement, carotid upstroke normal without bruit, jugular venous pressure normal Abdomen: Soft, non-tender, non-distended with normoactive bowel sounds. No hepatosplenomegaly. Abdominal aorta is normal size without bruit Extremities: No edema, no clubbing, no cyanosis, no ulcers,  Peripheral: 2+ radial, 2+ femoral, 2+ dorsal pedal pulses Neuro: Alert and oriented. Moves all extremities spontaneously. Psych:  Responds to questions appropriately with a normal affect.   Intake/Output Summary (Last 24 hours) at 04/27/2021 0803 Last data filed at 04/27/2021 0511 Gross per 24 hour  Intake 480 ml  Output 1250 ml  Net -770 ml     Inpatient Medications:   amiodarone  200 mg Oral BID   apixaban  5 mg Oral BID   donepezil  5 mg Oral QHS   ezetimibe  10 mg Oral Daily   furosemide  40 mg Oral Daily   levothyroxine  50 mcg Oral Q0600   meloxicam  15 mg Oral Daily   mometasone-formoterol  2 puff Inhalation BID   pantoprazole  40 mg Oral BID   pravastatin  40 mg Oral QHS   predniSONE  5 mg Oral Q breakfast   sodium chloride flush  3 mL Intravenous Q12H   tamoxifen  20 mg Oral Daily   ursodiol  600 mg Oral BID   venlafaxine XR  37.5 mg Oral Daily   Infusions:   Labs: Recent Labs    04/26/21 0438 04/27/21 0504  NA 134* 132*  K 4.1 3.8  CL 95* 92*  CO2 28 30  GLUCOSE 88 97  BUN 46* 46*  CREATININE 1.61* 1.65*  CALCIUM 8.4* 8.2*  Recent Labs    04/26/21 0438  AST 44*  ALT 84*  ALKPHOS 53  BILITOT 1.0  PROT 6.1*  ALBUMIN 3.0*    No results for input(s): WBC, NEUTROABS, HGB, HCT, MCV, PLT in the last 72 hours.  No results for input(s): CKTOTAL, CKMB, TROPONINI in the last 72 hours. Invalid input(s): POCBNP No results for input(s): HGBA1C in the last  72 hours.   Weights: Filed Weights   04/25/21 0240 04/26/21 0500 04/27/21 0359  Weight: 77.2 kg 77.4 kg 78.1 kg     Radiology/Studies:  DG Chest 2 View  Result Date: 04/19/2021 CLINICAL DATA:  Shortness of breath for several days. Hypoxia. EXAM: CHEST - 2 VIEW COMPARISON:  06/08/2016 FINDINGS: Moderate cardiomegaly shows no significant change. 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 A new tiny right pleural effusion is seen. No evidence of acute pulmonary edema or other infiltrate. Calcified granuloma again seen in the right lower lung. Right shoulder prosthesis also noted. IMPRESSION: Stable cardiomegaly. New tiny right pleural effusion. Electronically Signed   By: Marlaine Hind M.D.   On: 04/19/2021 21:10   ECHOCARDIOGRAM COMPLETE  Result Date: 04/05/2021    ECHOCARDIOGRAM REPORT   Patient Name:   Adriana Martin Date of Exam: 04/05/2021 Medical Rec #:  [id number]            Height:       62.0 in Accession #:    [id number]           Weight:       164.0 lb Date of Birth:  04-22-1946            BSA:          1.757 m Patient Age:    75 years             BP:           109/51 mmHg Patient Gender: F                    HR:           67 bpm. Exam Location:  [facility] Procedure: 2D Echo Indications:     Atrial Flutter I48.92  History:         Patient has no prior history of Echocardiogram examinations.  Sonographer:     Kathlen Brunswick RDCS Referring Phys:  [id number] Brady Diagnosing Phys: Skeet Latch MD IMPRESSIONS  1. Left ventricular ejection fraction, by estimation, is 60 to 65%. The left ventricle has normal function. The left ventricle has no regional wall motion abnormalities. Left ventricular diastolic parameters are indeterminate.  2. Right ventricular systolic function is normal. The right ventricular size is normal. There is normal pulmonary artery systolic pressure.  3. Left atrial size was  severely dilated.  4. Right atrial size was severely dilated.  5. Compared with the echo 02/2020, mean bioprosthetic mitral valve gradient has increased from 10 mmHg to 16 mmHg. Visually the valve appears unchanged from 02/2020. Gradients may be elevated in the setting of high flow and sepsis. No increased thickness or vegetations concerning for endocarditis. The mitral valve has been repaired/replaced. No evidence of mitral valve regurgitation. Severe mitral stenosis. Echo findings are consistent with normal structure and function of the mitral valve prosthesis.  6. The aortic valve is tricuspid. Aortic valve regurgitation is trivial. No aortic stenosis is present.  7. The inferior vena cava  is normal in size with greater than 50% respiratory variability, suggesting right atrial pressure of 3 mmHg. FINDINGS  Left Ventricle: Left ventricular ejection fraction, by estimation, is 60 to 65%. The left ventricle has normal function. The left ventricle has no regional wall motion abnormalities. The left ventricular internal cavity size was normal in size. There is  no left ventricular hypertrophy. Left ventricular diastolic parameters are indeterminate. Right Ventricle: The right ventricular size is normal. No increase in right ventricular wall thickness. Right ventricular systolic function is normal. There is normal pulmonary artery systolic pressure. The tricuspid regurgitant velocity is 2.35 m/s, and  with an assumed right atrial pressure of 3 mmHg, the estimated right ventricular systolic pressure is [id number] mmHg. Left Atrium: Left atrial size was severely dilated. Right Atrium: Right atrial size was severely dilated. Pericardium: There is no evidence of pericardial effusion. Mitral Valve: Compared with the echo 02/2020, mean bioprosthetic mitral valve gradient has increased from 10 mmHg to 16 mmHg. Visually the valve appears unchanged from 02/2020. Gradients may be elevated in the setting of high flow and sepsis. No  increased thickness or vegetations concerning for endocarditis. The mitral valve has been repaired/replaced. No evidence of mitral valve regurgitation. Echo findings are consistent with normal structure and function of the mitral valve prosthesis. Severe mitral valve stenosis. MV peak gradient, 38.4 mmHg. The mean mitral valve gradient is 16.0 mmHg. Tricuspid Valve: The tricuspid valve is normal in structure. Tricuspid valve regurgitation is trivial. No evidence of tricuspid stenosis. Aortic Valve: The aortic valve is tricuspid. Aortic valve regurgitation is trivial. No aortic stenosis is present. Aortic valve mean gradient measures 6.0 mmHg. Aortic valve peak gradient measures 13.7 mmHg. Aortic valve area, by VTI measures 1.44 cm. Pulmonic Valve: The pulmonic valve was normal in structure. Pulmonic valve regurgitation is not visualized. No evidence of pulmonic stenosis. Aorta: The aortic root is normal in size and structure. Venous: The inferior vena cava is normal in size with greater than 50% respiratory variability, suggesting right atrial pressure of 3 mmHg. IAS/Shunts: No atrial level shunt detected by color flow Doppler.  LEFT VENTRICLE PLAX 2D LVIDd:         4.30 cm  Diastology LVIDs:         3.00 cm  LV e' medial:    4.35 cm/s LV PW:         1.40 cm  LV E/e' medial:  49.4 LV IVS:        1.00 cm  LV e' lateral:   4.68 cm/s LVOT diam:     1.80 cm  LV E/e' lateral: 45.9 LV SV:         43 LV SV Index:   25 LVOT Area:     2.54 cm  LEFT ATRIUM           Index       RIGHT ATRIUM           Index LA diam:      4.80 cm 2.73 cm/m  RA Area:     23.50 cm LA Vol (A2C): 32.1 ml 18.27 ml/m RA Volume:   77.90 ml  44.33 ml/m LA Vol (A4C): 71.0 ml 40.41 ml/m  AORTIC VALVE                    PULMONIC VALVE AV Area (Vmax):    1.22 cm     PV Vmax:       0.91 m/s AV Area (Vmean):   1.28  cm     PV Peak grad:  3.3 mmHg AV Area (VTI):     1.44 cm AV Vmax:           185.00 cm/s AV Vmean:          114.000 cm/s AV VTI:             0.300 m AV Peak Grad:      13.7 mmHg AV Mean Grad:      6.0 mmHg LVOT Vmax:         88.70 cm/s LVOT Vmean:        57.200 cm/s LVOT VTI:          0.170 m LVOT/AV VTI ratio: 0.57  AORTA Ao Root diam: 2.90 cm Ao Asc diam:  3.20 cm MITRAL VALVE                TRICUSPID VALVE MV Area (PHT): 0.96 cm     TV Peak grad:   25.8 mmHg MV Area VTI:   0.32 cm     TV Vmax:        2.54 m/s MV Peak grad:  38.4 mmHg    TR Peak grad:   22.1 mmHg MV Mean grad:  16.0 mmHg    TR Vmax:        235.00 cm/s MV Vmax:       3.10 m/s MV Vmean:      184.0 cm/s   SHUNTS MV Decel Time: 789 msec     Systemic VTI:  0.17 m MV E velocity: 215.00 cm/s  Systemic Diam: 1.80 cm Skeet Latch MD Electronically signed by Skeet Latch MD Signature Date/Time: 04/05/2021/1:33:45 PM    Final    CT Angio Abd/Pel w/ and/or w/o  Result Date: 04/04/2021 CLINICAL DATA:  Rectal bleeding, Dementia, recent fall EXAM: CTA ABDOMEN AND PELVIS WITHOUT AND WITH CONTRAST TECHNIQUE: Multidetector CT imaging of the abdomen and pelvis was performed using the standard protocol during bolus administration of intravenous contrast. Multiplanar reconstructed images and MIPs were obtained and reviewed to evaluate the vascular anatomy. CONTRAST:  4m OMNIPAQUE IOHEXOL 350 MG/ML SOLN COMPARISON:  03/20/2013 FINDINGS: VASCULAR Aorta: Moderate calcified atheromatous plaque. No aneurysm, dissection, or stenosis. Celiac: Calcified ostial plaque without stenosis, patent distally within remarkable branch anatomy. SMA: Ostial plaque extending over length of approximately 1.6 cm resulting in moderate stenosis. Vessels patent distally with classic branch anatomy. Renals: Single left, with proximal branching. No stenosis. Duplicated right, superior dominant, both patent. IMA: Patent without evidence of aneurysm, dissection, vasculitis or significant stenosis. Inflow: Mild plaque in bilateral common iliac arteries without stenosis. No dissection or aneurysm. Proximal Outflow:  Bilateral common femoral and visualized portions of the superficial and profunda femoral arteries are patent without evidence of aneurysm, dissection, vasculitis or significant stenosis. Veins: Patent hepatic veins, portal vein, SMV, splenic vein, bilateral renal veins. Scattered mural calcifications in right common femoral and common iliac veins suggesting sequela of previous central venous catheter or DVT. IVC is nondistended, unremarkable. Review of the MIP images confirms the above findings. NON-VASCULAR Lower chest: Cardiomegaly with biatrial enlargement. Transvenous pacing leads partially visualized. Mitral valve replacement. No pleural or pericardial effusion. Hepatobiliary: No focal liver abnormality is seen. No gallstones, gallbladder wall thickening, or biliary dilatation. Pancreas: Unremarkable. No pancreatic ductal dilatation or surrounding inflammatory changes. Spleen: Normal in size without focal abnormality. Adrenals/Urinary Tract: Adrenal glands unremarkable. Symmetric renal enhancement. No hydronephrosis or urolithiasis. Urinary bladder incompletely distended. Stomach/Bowel: Stomach and small bowel are nondistended. Appendix  not identified. No pericecal inflammatory change. The colon is nondilated. Suspect mild circumferential wall thickening in the splenic flexure with mild adjacent inflammatory change. Scattered sigmoid diverticula without adjacent inflammatory change. No evidence of active extravasation. Lymphatic: No abdominal or pelvic adenopathy. Reproductive: Status post hysterectomy. No adnexal masses. Other: Bilateral pelvic phleboliths.  No ascites.  No free air. Musculoskeletal: Chronic T8 and L1 vertebral body compression deformities stable since 07/27/2019. Stable grade 1 anterolisthesis L4-5 without pars defect. No acute fracture or aggressive bone lesion. IMPRESSION: 1. No evidence of active GI bleeding. 2. Wall thickening in the splenic flexure of the colon suggesting colitis,  nonspecific. Consider GI consultation. 3. Sigmoid diverticulosis Electronically Signed   By: Lucrezia Europe M.D.   On: 04/04/2021 12:02     Assessment and Recommendation  75 y.o. female with known heart failure with preserved ejection fraction atrial fibrillation hypertension hyperlipidemia with acute illness and sepsis status post ventricular tachycardia of unknown primary etiology now controlled with amiodarone and slowly recovering 1.  Continue amiodarone at 200 mg twice per day and follow-up for significant side effects in office with adjustments of dosages thereafter 2.  No further cardiac diagnostics necessary at this time 3.  Begin ambulation and follow-up for improvement of symptoms and possible discharge to nursing facility with follow-up in 1 week for further adjustments of medication management 4.  Call if further questions Signed, Serafina Royals M.D. FACC

## 2021-04-28 DIAGNOSIS — R1312 Dysphagia, oropharyngeal phase: Secondary | ICD-10-CM | POA: Diagnosis not present

## 2021-04-28 DIAGNOSIS — Z7952 Long term (current) use of systemic steroids: Secondary | ICD-10-CM | POA: Diagnosis not present

## 2021-04-28 DIAGNOSIS — Z952 Presence of prosthetic heart valve: Secondary | ICD-10-CM | POA: Diagnosis not present

## 2021-04-28 DIAGNOSIS — M6281 Muscle weakness (generalized): Secondary | ICD-10-CM | POA: Diagnosis not present

## 2021-04-28 DIAGNOSIS — K743 Primary biliary cirrhosis: Secondary | ICD-10-CM | POA: Diagnosis not present

## 2021-04-28 DIAGNOSIS — N183 Chronic kidney disease, stage 3 unspecified: Secondary | ICD-10-CM | POA: Diagnosis not present

## 2021-04-28 DIAGNOSIS — Z950 Presence of cardiac pacemaker: Secondary | ICD-10-CM | POA: Diagnosis not present

## 2021-04-28 DIAGNOSIS — N179 Acute kidney failure, unspecified: Secondary | ICD-10-CM | POA: Diagnosis not present

## 2021-04-28 DIAGNOSIS — J9601 Acute respiratory failure with hypoxia: Secondary | ICD-10-CM | POA: Diagnosis not present

## 2021-04-28 DIAGNOSIS — R2681 Unsteadiness on feet: Secondary | ICD-10-CM | POA: Diagnosis not present

## 2021-04-28 DIAGNOSIS — I5033 Acute on chronic diastolic (congestive) heart failure: Secondary | ICD-10-CM | POA: Diagnosis not present

## 2021-04-28 DIAGNOSIS — Z7901 Long term (current) use of anticoagulants: Secondary | ICD-10-CM | POA: Diagnosis not present

## 2021-04-28 DIAGNOSIS — J449 Chronic obstructive pulmonary disease, unspecified: Secondary | ICD-10-CM | POA: Diagnosis not present

## 2021-04-28 DIAGNOSIS — I130 Hypertensive heart and chronic kidney disease with heart failure and stage 1 through stage 4 chronic kidney disease, or unspecified chronic kidney disease: Secondary | ICD-10-CM | POA: Diagnosis not present

## 2021-04-28 DIAGNOSIS — I5032 Chronic diastolic (congestive) heart failure: Secondary | ICD-10-CM | POA: Diagnosis not present

## 2021-04-28 DIAGNOSIS — I509 Heart failure, unspecified: Secondary | ICD-10-CM | POA: Diagnosis not present

## 2021-04-28 DIAGNOSIS — Z79899 Other long term (current) drug therapy: Secondary | ICD-10-CM | POA: Diagnosis not present

## 2021-04-28 LAB — POTASSIUM: Potassium: 3.6 mmol/L (ref 3.5–5.1)

## 2021-04-28 MED ORDER — POTASSIUM CHLORIDE CRYS ER 20 MEQ PO TBCR: 20.0000 meq | EXTENDED_RELEASE_TABLET | Freq: Every day | ORAL | Status: DC

## 2021-04-28 MED ORDER — POTASSIUM CHLORIDE CRYS ER 20 MEQ PO TBCR: 20.0000 meq | EXTENDED_RELEASE_TABLET | Freq: Once | ORAL | Status: DC

## 2021-04-28 MED ORDER — POTASSIUM CHLORIDE CRYS ER 20 MEQ PO TBCR: 20.0000 meq | EXTENDED_RELEASE_TABLET | Freq: Once | ORAL | 0 refills | Status: DC

## 2021-04-28 MED ORDER — AMIODARONE HCL 200 MG PO TABS: 200.0000 mg | ORAL_TABLET | Freq: Two times a day (BID) | ORAL | Status: DC

## 2021-04-28 MED ORDER — PANTOPRAZOLE SODIUM 40 MG PO TBEC: 40.0000 mg | DELAYED_RELEASE_TABLET | Freq: Two times a day (BID) | ORAL | Status: DC

## 2021-04-28 MED ORDER — POLYETHYLENE GLYCOL 3350 17 G PO PACK: 17.0000 g | PACK | Freq: Every day | ORAL | 0 refills | Status: DC | PRN

## 2021-04-28 MED ORDER — FUROSEMIDE 40 MG PO TABS: 40.0000 mg | ORAL_TABLET | Freq: Every day | ORAL | Status: DC

## 2021-04-28 NOTE — Care Management Important Message (Signed)
Important Message  Patient Details  Name: Iqlas Wanko MRN: [id number] Date of Birth: 01/08/1946   Medicare Important Message Given:  Yes     [name] Mikalia Fessel 04/28/2021, 11:46 AM

## 2021-04-28 NOTE — TOC Transition Note (Signed)
Transition of Care [facility]) - CM/SW Discharge Note   Patient Details  Name: Adriana Martin MRN: [id number] Date of Birth: 25-Aug-1946  [facility] CM/SW Contact:  Alberteen Sam, LCSW Phone Number: 04/28/2021, 10:12 AM   Clinical Narrative:     Patient will DC to: [place] Anticipated DC date: 04/28/21 Family notified: [name] (daughter) Transport by: Johnanna Schneiders  Per MD patient ready for DC to [facility]. RN, patient, patient's family, and facility notified of DC. Discharge Summary sent to facility. RN given number for report   [phone]. DC packet on chart. Ambulance transport requested for patient.  CSW signing off.  Pricilla Riffle, LCSW    Final next level of care: Skilled Nursing Facility Barriers to Discharge: No Barriers Identified   Patient Goals and CMS Choice Patient states their goals for this hospitalization and ongoing recovery are:: to go to SNF CMS Medicare.gov Compare Post Acute Care list provided to:: Patient Choice offered to / list presented to : Patient  Discharge Placement              Patient chooses bed at: [facility] Patient to be transferred to facility by: [facility] Name of family member notified: [name] (daughter) Patient and family notified of of transfer: 04/28/21  Discharge Plan and Services     Post Acute Care Choice: [place]                               Social Determinants of Health (SDOH) Interventions     Readmission Risk Interventions Readmission Risk Prevention Plan 04/06/2021  Transportation Screening Complete  PCP or Specialist Appt within 3-5 Days Complete  HRI or [place] Complete  Social Work Consult for [place] Planning/Counseling Complete  Palliative Care Screening Complete  Medication Review Press photographer) Complete  Some recent data might be hidden

## 2021-04-28 NOTE — Discharge Summary (Signed)
Tomeka Guay [id number] DOB: 12-19-1945 DOA: 04/19/2021  PCP: [name], MD (Inactive)  Admit date: 04/19/2021 Discharge date: 04/28/2021  Admitted From: [facility] Disposition:  SNF  Recommendations for Outpatient Follow-up:  Follow up with PCP in 1 week Please obtain BMP/CBC in one week Please follow up cardiology in one week     Discharge Condition:Stable CODE STATUS:FULL  Diet recommendation: Heart Healthy -<2gm sodium Brief/Interim Summary: Per [id number] Gigi Merenda is a 75 y.o. female with medical history significant of CHF, CVA, CKD 3B, A. fib, anxiety, anemia, breast cancer, COPD, cirrhosis, primary biliary cholangitis,'s dementia, hypertension, hyperlipidemia, hypothyroidism, MGUS, status post pacemaker who presented with ongoing shortness of breath.Home health nurse came by and pulse ox was noted to be around 87% and she does not use any home oxygen.  COVID was negative.  BNP was elevated. Chest x-ray with cardiomegaly and bilateral small pleural effusions. EKG on admission showed probable atrial flutter with ventricular paced rhythm.   Patient was admitted to the hospital to treat for fluid overload.  On 7/31 she was status post ventricular tachycardia cardiac arrest, status post CPR  and ACLS protocal was initiated and was intubated and transferred to ICU.Cardiology was consulted. Vtach occurred in setting of hypokalemia. Her EF was normal. Was started on Amiodarone gtt with transition to po. Her troponin was elevated post VT arrest and defribrillation.   Vs/p entricular tachycardia/vfib cardiac arrest.   On amiodarone po 200 mg twice daily. Cardiology following no further cardiac diagnostic necessary at this time Will need to follow-up with cardiology in 1 week for further adjustments of medication management and any further diagnostic testing as outpatient Her chest is sore due to CPR     Acute kidney injury on chronic kidney disease stage IIIa.    Has been as low  as 1.08 during the hospital course.  Her Lasix was initially held due to increasing creatinine which resulted in some improvement. It appears she is close to her baseline.   Lasix was resumed to 40 mg daily.  Potassium was added.   She will need to have labs done within 3 days and supplement electrolytes as needed  Needs to follow-up with PCP. Avoid nephrotoxic medications    Acute on chronic diastolic congestive heart failure.   Improved clinically Continue Lasix as above     Chronic atrial fibrillation/flutter patient has a pacemaker.  Continue amiodarone and Eliquis     Acute on chronic hypoxic respiratory failure. 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  Physical therapy recommending rehab.      Discharge Diagnoses:  Principal Problem:   Acute exacerbation of CHF (congestive heart failure) (HCC) Active Problems:   Anxiety   Primary biliary cholangitis (HCC)   A-fib (HCC)   Hyperlipidemia   Acute kidney injury superimposed on CKD (HCC)   HTN (hypertension)   COPD (chronic obstructive pulmonary disease) (HCC)   HLD (hyperlipidemia)   Hypothyroid   Cirrhosis (HCC)   Breast cancer (HCC)   Dementia (HCC)   CHF exacerbation ([place])   Cardiac arrest ([place])   Epigastric pain   Weakness   Acute respiratory failure with hypoxia [facility])    Discharge Instructions  Discharge Instructions     Call MD for:  difficulty breathing, headache or visual disturbances   Complete by: As directed  Diet - low sodium heart healthy   Complete by: As directed    Discharge wound care:   Complete by: As directed    As above   Increase activity slowly   Complete by: As directed       Allergies as of 04/28/2021       Reactions    Atorvastatin Shortness Of Breath   Calcium Shortness Of Breath   Calcium Carbonate    Other reaction(s): Unknown   Fluoxetine    Other reaction(s): Unknown   Hydrochlorothiazide    Other reaction(s): Unknown        Medication List     TAKE these medications    acetaminophen 500 MG tablet Commonly known as: TYLENOL Take 750 mg by mouth at bedtime as needed for mild pain. For jaw fracture   fluticasone-salmeterol 100-50 MCG/ACT Aepb Commonly known as: ADVAIR Inhale 1 puff into the lungs 2 (two) times daily.   Advair Diskus 100-50 MCG/ACT Aepb Generic drug: fluticasone-salmeterol Inhale 1 puff into the lungs 2 (two) times daily.   amiodarone 200 MG tablet Commonly known as: PACERONE Take 1 tablet (200 mg total) by mouth 2 (two) times daily.   B-complex with vitamin C tablet Take 1 tablet by mouth daily.   donepezil 5 MG tablet Commonly known as: ARICEPT Take 5 mg by mouth at bedtime.   Eliquis 5 MG Tabs tablet Generic drug: apixaban Take 5 mg by mouth 2 (two) times daily.   ezetimibe 10 MG tablet Commonly known as: ZETIA Take 10 mg by mouth daily.   furosemide 40 MG tablet Commonly known as: LASIX Take 1 tablet (40 mg total) by mouth daily. Start taking on: April 29, 2021   levothyroxine 50 MCG tablet Commonly known as: SYNTHROID Take 50 mcg by mouth daily before breakfast.   multivitamin tablet Take 1 tablet by mouth daily.   pantoprazole 40 MG tablet Commonly known as: PROTONIX Take 1 tablet (40 mg total) by mouth 2 (two) times daily.   polyethylene glycol 17 g packet Commonly known as: MIRALAX / GLYCOLAX Take 17 g by mouth daily as needed for mild constipation.   potassium chloride SA 20 MEQ tablet Commonly known as: KLOR-CON Take 1 tablet (20 mEq total) by mouth daily.   pravastatin 40 MG tablet Commonly known as: PRAVACHOL Take 40 mg by mouth at bedtime.   predniSONE 5 MG tablet Commonly known as: DELTASONE Take 5 mg by mouth daily with  breakfast.   tamoxifen 20 MG tablet Commonly known as: NOLVADEX Take 20 mg by mouth daily. What changed: Another medication with the same name was removed. Continue taking this medication, and follow the directions you see here.   ursodiol 300 MG capsule Commonly known as: ACTIGALL Take 600 mg by mouth 2 (two) times daily.   venlafaxine XR 37.5 MG 24 hr capsule Commonly known as: EFFEXOR-XR Take 37.5 mg by mouth daily.   Ventolin HFA 108 (90 Base) MCG/ACT inhaler Generic drug: albuterol Inhale 2 puffs into the lungs every 4 (four) hours as needed.   Vitamin D (Ergocalciferol) 1.25 MG (50000 UNIT) Caps capsule Commonly known as: DRISDOL Take 50,000 Units by mouth every 14 (fourteen) days. On Sundays               Discharge Care Instructions  (From admission, onward)           Start     Ordered   04/28/21 0000  Discharge wound care:       Comments:  As above   04/28/21 1024            Follow-up Information     Corey Skains, MD Follow up in 1 week(s).   Specialty: Cardiology Why: per cardiology . Contact information: [address] [facility] [place] [address] [phone]         [name], MD Follow up in 1 week(s).   Specialty: Internal Medicine Contact information: [place] [address] [phone]                Allergies  Allergen Reactions   Atorvastatin Shortness Of Breath   Calcium Shortness Of Breath   Calcium Carbonate     Other reaction(s): Unknown   Fluoxetine     Other reaction(s): Unknown   Hydrochlorothiazide     Other reaction(s): Unknown    Consultations: PCCM, cardiology   Procedures/Studies: DG Chest 2 View  Result Date: 04/19/2021 CLINICAL DATA:  Shortness of breath for several days. Hypoxia. EXAM: CHEST - 2 VIEW COMPARISON:  06/08/2016 FINDINGS: Moderate cardiomegaly shows no significant change. Prosthetic mitral valve  again noted. A dual lead transvenous pancreatic is seen with leads in the coronary sinus and right ventricle. A new tiny right pleural effusion is seen. No evidence of acute pulmonary edema or other infiltrate. Calcified granuloma again seen in the right lower lung. Right shoulder prosthesis also noted. IMPRESSION: Stable cardiomegaly. New tiny right pleural effusion. Electronically Signed   By: Marlaine Hind M.D.   On: 04/19/2021 21:10   ECHOCARDIOGRAM COMPLETE  Result Date: 04/05/2021    ECHOCARDIOGRAM REPORT   Patient Name:   DEAIRA CONKEY Date of Exam: 04/05/2021 Medical Rec #:  [id number]            Height:       62.0 in Accession #:    [id number]           Weight:       164.0 lb Date of Birth:  1946-09-01            BSA:          1.757 m Patient Age:    20 years             BP:           109/51 mmHg Patient Gender: F                    HR:           67 bpm. Exam Location:  [facility] Procedure: 2D Echo Indications:     Atrial Flutter I48.92  History:         Patient has no prior history of Echocardiogram examinations.  Sonographer:     Kathlen Brunswick RDCS Referring Phys:  [id number] [place] Diagnosing Phys: Skeet Latch MD IMPRESSIONS  1. Left ventricular ejection fraction, by estimation, is 60 to 65%. The left ventricle has normal function. The left ventricle has no regional wall motion abnormalities. Left ventricular diastolic parameters are indeterminate.  2. Right ventricular systolic function is normal. The right ventricular size is normal. There is normal pulmonary artery systolic pressure.  3. Left atrial size was severely dilated.  4. Right atrial size was severely dilated.  5. Compared with the echo 02/2020, mean bioprosthetic mitral valve gradient has increased from 10 mmHg to 16 mmHg. Visually the valve appears unchanged from 02/2020. Gradients may be elevated in the setting of high flow and  sepsis. No increased thickness or vegetations concerning for endocarditis. The mitral valve has  been repaired/replaced. No evidence of mitral valve regurgitation. Severe mitral stenosis. Echo findings are consistent with normal structure and function of the mitral valve prosthesis.  6. The aortic valve is tricuspid. Aortic valve regurgitation is trivial. No aortic stenosis is present.  7. The inferior vena cava is normal in size with greater than 50% respiratory variability, suggesting right atrial pressure of 3 mmHg. FINDINGS  Left Ventricle: Left ventricular ejection fraction, by estimation, is 60 to 65%. The left ventricle has normal function. The left ventricle has no regional wall motion abnormalities. The left ventricular internal cavity size was normal in size. There is  no left ventricular hypertrophy. Left ventricular diastolic parameters are indeterminate. Right Ventricle: The right ventricular size is normal. No increase in right ventricular wall thickness. Right ventricular systolic function is normal. There is normal pulmonary artery systolic pressure. The tricuspid regurgitant velocity is 2.35 m/s, and  with an assumed right atrial pressure of 3 mmHg, the estimated right ventricular systolic pressure is [id number] mmHg. Left Atrium: Left atrial size was severely dilated. Right Atrium: Right atrial size was severely dilated. Pericardium: There is no evidence of pericardial effusion. Mitral Valve: Compared with the echo 02/2020, mean bioprosthetic mitral valve gradient has increased from 10 mmHg to 16 mmHg. Visually the valve appears unchanged from 02/2020. Gradients may be elevated in the setting of high flow and sepsis. No increased thickness or vegetations concerning for endocarditis. The mitral valve has been repaired/replaced. No evidence of mitral valve regurgitation. Echo findings are consistent with normal structure and function of the mitral valve prosthesis. Severe mitral valve stenosis. MV peak gradient, 38.4 mmHg. The mean mitral valve gradient is 16.0 mmHg. Tricuspid Valve: The tricuspid valve  is normal in structure. Tricuspid valve regurgitation is trivial. No evidence of tricuspid stenosis. Aortic Valve: The aortic valve is tricuspid. Aortic valve regurgitation is trivial. No aortic stenosis is present. Aortic valve mean gradient measures 6.0 mmHg. Aortic valve peak gradient measures 13.7 mmHg. Aortic valve area, by VTI measures 1.44 cm. Pulmonic Valve: The pulmonic valve was normal in structure. Pulmonic valve regurgitation is not visualized. No evidence of pulmonic stenosis. Aorta: The aortic root is normal in size and structure. Venous: The inferior vena cava is normal in size with greater than 50% respiratory variability, suggesting right atrial pressure of 3 mmHg. IAS/Shunts: No atrial level shunt detected by color flow Doppler.  LEFT VENTRICLE PLAX 2D LVIDd:         4.30 cm  Diastology LVIDs:         3.00 cm  LV e' medial:    4.35 cm/s LV PW:         1.40 cm  LV E/e' medial:  49.4 LV IVS:        1.00 cm  LV e' lateral:   4.68 cm/s LVOT diam:     1.80 cm  LV E/e' lateral: 45.9 LV SV:         43 LV SV Index:   25 LVOT Area:     2.54 cm  LEFT ATRIUM           Index       RIGHT ATRIUM           Index LA diam:      4.80 cm 2.73 cm/m  RA Area:     23.50 cm LA Vol (A2C): 32.1 ml 18.27 ml/m RA Volume:   77.90 ml  44.33 ml/m LA Vol (A4C): 71.0 ml 40.41 ml/m  AORTIC VALVE                    PULMONIC VALVE AV Area (Vmax):    1.22 cm     PV Vmax:       0.91 m/s AV Area (Vmean):   1.28 cm     PV Peak grad:  3.3 mmHg AV Area (VTI):     1.44 cm AV Vmax:           185.00 cm/s AV Vmean:          114.000 cm/s AV VTI:            0.300 m AV Peak Grad:      13.7 mmHg AV Mean Grad:      6.0 mmHg LVOT Vmax:         88.70 cm/s LVOT Vmean:        57.200 cm/s LVOT VTI:          0.170 m LVOT/AV VTI ratio: 0.57  AORTA Ao Root diam: 2.90 cm Ao Asc diam:  3.20 cm MITRAL VALVE                TRICUSPID VALVE MV Area (PHT): 0.96 cm     TV Peak grad:   25.8 mmHg MV Area VTI:   0.32 cm     TV Vmax:        2.54 m/s MV  Peak grad:  38.4 mmHg    TR Peak grad:   22.1 mmHg MV Mean grad:  16.0 mmHg    TR Vmax:        235.00 cm/s MV Vmax:       3.10 m/s MV Vmean:      184.0 cm/s   SHUNTS MV Decel Time: 789 msec     Systemic VTI:  0.17 m MV E velocity: 215.00 cm/s  Systemic Diam: 1.80 cm Skeet Latch MD Electronically signed by Skeet Latch MD Signature Date/Time: 04/05/2021/1:33:45 PM    Final    CT Angio Abd/Pel w/ and/or w/o  Result Date: 04/04/2021 CLINICAL DATA:  Rectal bleeding, Dementia, recent fall EXAM: CTA ABDOMEN AND PELVIS WITHOUT AND WITH CONTRAST TECHNIQUE: Multidetector CT imaging of the abdomen and pelvis was performed using the standard protocol during bolus administration of intravenous contrast. Multiplanar reconstructed images and MIPs were obtained and reviewed to evaluate the vascular anatomy. CONTRAST:  14m OMNIPAQUE IOHEXOL 350 MG/ML SOLN COMPARISON:  03/20/2013 FINDINGS: VASCULAR Aorta: Moderate calcified atheromatous plaque. No aneurysm, dissection, or stenosis. Celiac: Calcified ostial plaque without stenosis, patent distally within remarkable branch anatomy. SMA: Ostial plaque extending over length of approximately 1.6 cm resulting in moderate stenosis. Vessels patent distally with classic branch anatomy. Renals: Single left, with proximal branching. No stenosis. Duplicated right, superior dominant, both patent. IMA: Patent without evidence of aneurysm, dissection, vasculitis or significant stenosis. Inflow: Mild plaque in bilateral common iliac arteries without stenosis. No dissection or aneurysm. Proximal Outflow: Bilateral common femoral and visualized portions of the superficial and profunda femoral arteries are patent without evidence of aneurysm, dissection, vasculitis or significant stenosis. Veins: Patent hepatic veins, portal vein, SMV, splenic vein, bilateral renal veins. Scattered mural calcifications in right common femoral and common iliac veins suggesting sequela of previous central  venous catheter or DVT. IVC is nondistended, unremarkable. Review of the MIP images confirms the above findings. NON-VASCULAR Lower chest: Cardiomegaly with biatrial enlargement. Transvenous pacing leads partially visualized. Mitral valve replacement.  No pleural or pericardial effusion. Hepatobiliary: No focal liver abnormality is seen. No gallstones, gallbladder wall thickening, or biliary dilatation. Pancreas: Unremarkable. No pancreatic ductal dilatation or surrounding inflammatory changes. Spleen: Normal in size without focal abnormality. Adrenals/Urinary Tract: Adrenal glands unremarkable. Symmetric renal enhancement. No hydronephrosis or urolithiasis. Urinary bladder incompletely distended. Stomach/Bowel: Stomach and small bowel are nondistended. Appendix not identified. No pericecal inflammatory change. The colon is nondilated. Suspect mild circumferential wall thickening in the splenic flexure with mild adjacent inflammatory change. Scattered sigmoid diverticula without adjacent inflammatory change. No evidence of active extravasation. Lymphatic: No abdominal or pelvic adenopathy. Reproductive: Status post hysterectomy. No adnexal masses. Other: Bilateral pelvic phleboliths.  No ascites.  No free air. Musculoskeletal: Chronic T8 and L1 vertebral body compression deformities stable since 07/27/2019. Stable grade 1 anterolisthesis L4-5 without pars defect. No acute fracture or aggressive bone lesion. IMPRESSION: 1. No evidence of active GI bleeding. 2. Wall thickening in the splenic flexure of the colon suggesting colitis, nonspecific. Consider GI consultation. 3. Sigmoid diverticulosis Electronically Signed   By: Lucrezia Europe M.D.   On: 04/04/2021 12:02      Subjective: Denies shortness of breath.  Denies chest pain.  Discharge Exam: Vitals:   04/28/21 0441 04/28/21 0900  BP: (!) 127/58 (!) 125/58  Pulse: (!) 59 60  Resp: 16 18  Temp: (!) 97.5 F (36.4 C) 98 F (36.7 C)  SpO2: 99% 98%    Vitals:   04/27/21 2134 04/28/21 0157 04/28/21 0441 04/28/21 0900  BP: (!) 127/58  (!) 127/58 (!) 125/58  Pulse: 63  (!) 59 60  Resp: '16  16 18  '$ Temp: (!) 97.5 F (36.4 C)  (!) 97.5 F (36.4 C) 98 F (36.7 C)  TempSrc: Oral  Oral Oral  SpO2: 99%  99% 98%  Weight:  76.7 kg    Height:        General: Pt is alert, awake, not in acute distress Cardiovascular: RRR, S1/S2 +, no rubs, no gallops Respiratory: CTA bilaterally, no wheezing, no rhonchi Abdominal: Soft, NT, ND, bowel sounds + Extremities: no edema    The results of significant diagnostics from this hospitalization (including imaging, microbiology, ancillary and laboratory) are listed below for reference.     Microbiology: Recent Results (from the past 240 hour(s))  Resp Panel by RT-PCR (Flu A&B, Covid) Nasopharyngeal Swab     Status: None   Collection Time: 04/19/21 10:23 PM   Specimen: Nasopharyngeal Swab; Nasopharyngeal(NP) swabs in vial transport medium  Result Value Ref Range Status   SARS Coronavirus 2 by RT PCR NEGATIVE NEGATIVE Final    Comment: (NOTE) SARS-CoV-2 target nucleic acids are NOT DETECTED.  The SARS-CoV-2 RNA is generally detectable in upper respiratory specimens during the acute phase of infection. The lowest concentration of SARS-CoV-2 viral copies this assay can detect is 138 copies/mL. A negative result does not preclude SARS-Cov-2 infection and should not be used as the sole basis for treatment or other patient management decisions. A negative result may occur with  improper specimen collection/handling, submission of specimen other than nasopharyngeal swab, presence of viral mutation(s) within the areas targeted by this assay, and inadequate number of viral copies(<138 copies/mL). A negative result must be combined with clinical observations, patient history, and epidemiological information. The expected result is Negative.  Fact Sheet for Patients:   EntrepreneurPulse.com.au  Fact Sheet for Healthcare Providers:  IncredibleEmployment.be  This test is no t yet approved or cleared by the [place] and  has been authorized  for detection and/or diagnosis of SARS-CoV-2 by FDA under an Emergency Use Authorization (EUA). This EUA will remain  in effect (meaning this test can be used) for the duration of the COVID-19 declaration under Section 564(b)(1) of the Act, 21 U.S.C.section 360bbb-3(b)(1), unless the authorization is terminated  or revoked sooner.       Influenza A by PCR NEGATIVE NEGATIVE Final   Influenza B by PCR NEGATIVE NEGATIVE Final    Comment: (NOTE) The Xpert Xpress SARS-CoV-2/FLU/RSV plus assay is intended as an aid in the diagnosis of influenza from Nasopharyngeal swab specimens and should not be used as a sole basis for treatment. Nasal washings and aspirates are unacceptable for Xpert Xpress SARS-CoV-2/FLU/RSV testing.  Fact Sheet for Patients: EntrepreneurPulse.com.au  Fact Sheet for Healthcare Providers: IncredibleEmployment.be  This test is not yet approved or cleared by the [place] FDA and has been authorized for detection and/or diagnosis of SARS-CoV-2 by FDA under an Emergency Use Authorization (EUA). This EUA will remain in effect (meaning this test can be used) for the duration of the COVID-19 declaration under Section 564(b)(1) of the Act, 21 U.S.C. section 360bbb-3(b)(1), unless the authorization is terminated or revoked.  Performed at [facility], [address] [place], [place] [address]      Labs: BNP (last 3 results) Recent Labs    04/04/21 1329 04/19/21 2152  BNP 268.0* [id number]*   Basic Metabolic Panel: Recent Labs  Lab 04/22/21 0715 04/23/21 0503 04/24/21 0548 04/25/21 0529 04/26/21 0438 04/27/21 0504 04/28/21 0840  NA 135 131* 135 132* 134* 132*  --   K 4.2 4.0 4.6 4.9 4.1 3.8  3.6  CL 98 93* 93* 96* 95* 92*  --   CO2 '27 30 31 28 28 30  '$ --   GLUCOSE 101* 98 96 111* 88 97  --   BUN 28* 39* 40* 46* 46* 46*  --   CREATININE 1.40* 1.76* 1.59* 1.51* 1.61* 1.65*  --   CALCIUM 8.6* 8.9 9.4 8.9 8.4* 8.2*  --   MG 1.9 2.0 2.0  --   --   --   --   PHOS 2.9 4.0 3.9  --   --   --   --    Liver Function Tests: Recent Labs  Lab 04/24/21 0548 04/26/21 0438  AST 73* 44*  ALT 145* 84*  ALKPHOS 58 53  BILITOT 0.8 1.0  PROT 6.7 6.1*  ALBUMIN 3.2* 3.0*   No results for input(s): LIPASE, AMYLASE in the last 168 hours. No results for input(s): AMMONIA in the last 168 hours. CBC: Recent Labs  Lab 04/23/21 0503  WBC 11.5*  HGB 10.6*  HCT 32.1*  MCV 92.5  PLT 232   Cardiac Enzymes: No results for input(s): CKTOTAL, CKMB, CKMBINDEX, TROPONINI in the last 168 hours. BNP: Invalid input(s): POCBNP CBG: Recent Labs  Lab 04/27/21 0812  GLUCAP 83   D-Dimer No results for input(s): DDIMER in the last 72 hours. Hgb A1c No results for input(s): HGBA1C in the last 72 hours. Lipid Profile No results for input(s): CHOL, HDL, LDLCALC, TRIG, CHOLHDL, LDLDIRECT in the last 72 hours. Thyroid function studies No results for input(s): TSH, T4TOTAL, T3FREE, THYROIDAB in the last 72 hours.  Invalid input(s): FREET3 Anemia work up No results for input(s): VITAMINB12, FOLATE, FERRITIN, TIBC, IRON, RETICCTPCT in the last 72 hours. Urinalysis    Component Value Date/Time   COLORURINE YELLOW (A) 12/28/2017 1521   APPEARANCEUR CLEAR (A) 12/28/2017 1521   APPEARANCEUR Clear  06/09/2013 2122   LABSPEC 1.010 12/28/2017 1521   LABSPEC 1.004 06/09/2013 2122   PHURINE 5.0 12/28/2017 1521   GLUCOSEU NEGATIVE 12/28/2017 1521   GLUCOSEU Negative 06/09/2013 2122   HGBUR NEGATIVE 12/28/2017 1521   BILIRUBINUR NEGATIVE 12/28/2017 1521   BILIRUBINUR Negative 06/09/2013 2122   KETONESUR NEGATIVE 12/28/2017 1521   PROTEINUR NEGATIVE 12/28/2017 1521   NITRITE NEGATIVE 12/28/2017 1521    LEUKOCYTESUR SMALL (A) 12/28/2017 1521   LEUKOCYTESUR Negative 06/09/2013 2122   Sepsis Labs Invalid input(s): PROCALCITONIN,  WBC,  LACTICIDVEN Microbiology Recent Results (from the past 240 hour(s))  Resp Panel by RT-PCR (Flu A&B, Covid) Nasopharyngeal Swab     Status: None   Collection Time: 04/19/21 10:23 PM   Specimen: Nasopharyngeal Swab; Nasopharyngeal(NP) swabs in vial transport medium  Result Value Ref Range Status   SARS Coronavirus 2 by RT PCR NEGATIVE NEGATIVE Final    Comment: (NOTE) SARS-CoV-2 target nucleic acids are NOT DETECTED.  The SARS-CoV-2 RNA is generally detectable in upper respiratory specimens during the acute phase of infection. The lowest concentration of SARS-CoV-2 viral copies this assay can detect is 138 copies/mL. A negative result does not preclude SARS-Cov-2 infection and should not be used as the sole basis for treatment or other patient management decisions. A negative result may occur with  improper specimen collection/handling, submission of specimen other than nasopharyngeal swab, presence of viral mutation(s) within the areas targeted by this assay, and inadequate number of viral copies(<138 copies/mL). A negative result must be combined with clinical observations, patient history, and epidemiological information. The expected result is Negative.  Fact Sheet for Patients:  EntrepreneurPulse.com.au  Fact Sheet for Healthcare Providers:  IncredibleEmployment.be  This test is no t yet approved or cleared by the [place] FDA and  has been authorized for detection and/or diagnosis of SARS-CoV-2 by FDA under an Emergency Use Authorization (EUA). This EUA will remain  in effect (meaning this test can be used) for the duration of the COVID-19 declaration under Section 564(b)(1) of the Act, 21 U.S.C.section 360bbb-3(b)(1), unless the authorization is terminated  or revoked sooner.       Influenza A by  PCR NEGATIVE NEGATIVE Final   Influenza B by PCR NEGATIVE NEGATIVE Final    Comment: (NOTE) The Xpert Xpress SARS-CoV-2/FLU/RSV plus assay is intended as an aid in the diagnosis of influenza from Nasopharyngeal swab specimens and should not be used as a sole basis for treatment. Nasal washings and aspirates are unacceptable for Xpert Xpress SARS-CoV-2/FLU/RSV testing.  Fact Sheet for Patients: EntrepreneurPulse.com.au  Fact Sheet for Healthcare Providers: IncredibleEmployment.be  This test is not yet approved or cleared by the [place] FDA and has been authorized for detection and/or diagnosis of SARS-CoV-2 by FDA under an Emergency Use Authorization (EUA). This EUA will remain in effect (meaning this test can be used) for the duration of the COVID-19 declaration under Section 564(b)(1) of the Act, 21 U.S.C. section 360bbb-3(b)(1), unless the authorization is terminated or revoked.  Performed at [facility], [address] [place], [place] [address]      Time coordinating discharge: Over 30 minutes  SIGNED:   Nolberto Hanlon, MD  [facility] 04/28/2021, 10:46 AM Pager   If 7PM-7AM, please contact night-coverage [email or web address] Password [email or web address]

## 2021-04-28 NOTE — Progress Notes (Signed)
Report called to Control and instrumentation engineer at [facility] at 1050. Pt being picked up by transport at this time. Belongings and paperwork sent with transport. Pt alert and oriented x 1 and VSS.

## 2021-05-05 NOTE — Progress Notes (Signed)
Patient ID: Adriana Martin, female    DOB: 12-09-1945, 75 y.o.   MRN: [id number]  HPI  [name] is a 75 y/o female with a history of breast cancer, hyperlipidemia, HTN, CKD, thyroid disease, anemia, anxiety, alcohol use, atrial fibrillation, dementia, cirrhosis, depression, COPD, monoclonal gammopathy and chronic heart failure.   Echo report from 04/05/21 reviewed and showed an EF of 60-65% along with severe LAE along with severe MS.   Admitted 04/19/21 due to shortness of breath. Developed cardiac arrest due to VT due to hypokalemia. Cardiology consult obtained. Amiodarone gtt started with transition to oral medications. Lasix initially held due to increasing creatinine & then resumed after creatinine improved. Discharged after 9 days.   She presents today for her initial visit with a chief complaint of moderate fatigue with little exertion. Daughter that is present with her says that the fatigue has worsened since her recent admission with VT. She doesn't have any other symptoms and specifically denies any shortness of breath, cough, chest pain, palpitations, pedal edema, abdominal distention, dizziness or difficulty sleeping.   Facility medication list does not have PRN albuterol or advair inhalers listed. Daughter says the albuterol is at patient's bedside but that she really doesn't recognize when she needs to use it.   Patient came without her oxygen on and her room air pulse ox was 90%. Placed on her ordered 2L and it quickly rose to 100%.  Past Medical History:  Diagnosis Date   Alcohol use    Anemia    Anxiety    Atrial fibrillation ([place])    B12 deficiency    Breast cancer ([place])    RT Lumpectomy c radiation 2012   Breast cancer (HCC)    CHF (congestive heart failure) (HCC)    Chronic diastolic CHF (congestive heart failure) (HCC)    Chronic kidney disease    Cirrhosis (HCC)    CKD (chronic kidney disease), stage III (HCC)    COPD (chronic obstructive pulmonary disease)  (HCC)    Dementia (HCC)    Depression    Family history of breast cancer    Family history of esophageal cancer    Family history of multiple myeloma    GERD (gastroesophageal reflux disease)    Headache    Heart murmur    HLD (hyperlipidemia)    HTN (hypertension)    Hyperlipemia    Hyperlipidemia    Hypertension    Hypothyroid    Hypothyroidism    Iron (Fe) deficiency anemia    Monoclonal gammopathy    Pacemaker    Personal history of radiation therapy    Presence of permanent cardiac pacemaker    Shortness of breath dyspnea    Thrombocytopenia (HCC)    Vitamin D deficiency    Past Surgical History:  Procedure Laterality Date   ABDOMINAL HYSTERECTOMY     Partial   ABLATION     APPENDECTOMY     BICEPT TENODESIS Right 01/07/2018   Procedure: BICEPS TENODESIS;  Surgeon: Leim Fabry, MD;  Location: [facility];  Service: Orthopedics;  Laterality: Right;   BREAST BIOPSY Right 2012   BREAST BIOPSY Right 07/21/2019   venus clip, [place] Bx,positive   BREAST LUMPECTOMY Right 2012   positive/rad   BREAST SURGERY     CARDIAC SURGERY     CARDIAC VALVE REPLACEMENT     COLONOSCOPY  2012   ELECTROPHYSIOLOGIC STUDY N/A 01/23/2015   Procedure: CARDIOVERSION;  Surgeon: Corey Skains, MD;  Location: [facility];  Service:  Cardiovascular;  Laterality: N/A;   ELECTROPHYSIOLOGIC STUDY N/A 05/27/2016   Procedure: CARDIOVERSION;  Surgeon: Corey Skains, MD;  Location: [facility];  Service: Cardiovascular;  Laterality: N/A;   ESOPHAGOGASTRODUODENOSCOPY  2012   EYE SURGERY     IR CT HEAD LTD  03/10/2020   IR PERCUTANEOUS ART THROMBECTOMY/INFUSION INTRACRANIAL INC DIAG ANGIO  03/10/2020   IR US GUIDE VASC ACCESS RIGHT  03/10/2020   MASTECTOMY Right    Mastectomy Right    MASTECTOMY WITH AXILLARY LYMPH NODE DISSECTION Right 08/09/2019   Procedure: MASTECTOMY WITH AXILLARY LYMPH NODE DISSECTION;  Surgeon: Robert Bellow, MD;  Location: [facility];  Service: General;  Laterality: Right;   MITRAL  VALVE REPLACEMENT     mvp     PACEMAKER PLACEMENT N/A    RADIOLOGY WITH ANESTHESIA N/A 03/10/2020   Procedure: IR WITH ANESTHESIA;  Surgeon: Radiologist, Medication, MD;  Location: [facility];  Service: Radiology;  Laterality: N/A;   REVERSE SHOULDER ARTHROPLASTY Right 01/07/2018   Procedure: REVERSE SHOULDER ARTHROPLASTY;  Surgeon: Leim Fabry, MD;  Location: [facility];  Service: Orthopedics;  Laterality: Right;   VSD REPAIR     VSD REPAIR N/A    Family History  Problem Relation Age of Onset   Multiple myeloma Father    Esophageal cancer Brother    Breast cancer Mother        never treated, dx. >50   Esophageal cancer Brother    Breast cancer Daughter 28   Cancer Daughter 44       rare blood cancer   Social History   Tobacco Use   Smoking status: Never   Smokeless tobacco: Never  Substance Use Topics   Alcohol use: Not Currently    Comment: socially   Allergies  Allergen Reactions   Atorvastatin Shortness Of Breath   Calcium Shortness Of Breath   Calcium Carbonate     Other reaction(s): Unknown   Fluoxetine     Other reaction(s): Unknown   Hydrochlorothiazide     Other reaction(s): Unknown   Prior to Admission medications   Medication Sig Start Date End Date Taking? Authorizing Provider  acetaminophen (TYLENOL) 500 MG tablet Take 750 mg by mouth at bedtime as needed for mild pain. For jaw fracture   Yes [provider]  amiodarone (PACERONE) 200 MG tablet Take 1 tablet (200 mg total) by mouth 2 (two) times daily. 04/28/21  Yes Nolberto Hanlon, MD  B Complex-C (B-COMPLEX WITH VITAMIN C) tablet Take 1 tablet by mouth daily.   Yes [provider]  ELIQUIS 5 MG TABS tablet Take 5 mg by mouth 2 (two) times daily. 03/05/21  Yes [provider]  ezetimibe (ZETIA) 10 MG tablet Take 10 mg by mouth daily. 02/05/21  Yes [provider]  furosemide (LASIX) 40 MG tablet Take 1 tablet (40 mg total) by mouth daily. 04/29/21  Yes Nolberto Hanlon, MD   ipratropium-albuterol (DUONEB) 0.5-2.5 (3) MG/3ML SOLN Take 3 mLs by nebulization 3 (three) times daily.   Yes [provider]  levothyroxine (SYNTHROID) 50 MCG tablet Take 50 mcg by mouth daily before breakfast. 02/05/21  Yes [provider]  melatonin 5 MG TABS Take 5 mg by mouth daily. Given at 5pm   Yes [provider]  Multiple Vitamin (MULTIVITAMIN) tablet Take 1 tablet by mouth daily.   Yes [provider]  pantoprazole (PROTONIX) 40 MG tablet Take 1 tablet (40 mg total) by mouth 2 (two) times daily. 04/28/21  Yes Amery, Gwynneth Albright,  MD  polyethylene glycol (MIRALAX / GLYCOLAX) 17 g packet Take 17 g by mouth daily as needed for mild constipation. 04/28/21  Yes Nolberto Hanlon, MD  potassium chloride SA (KLOR-CON) 20 MEQ tablet Take 1 tablet (20 mEq total) by mouth daily. 04/28/21  Yes Nolberto Hanlon, MD  pravastatin (PRAVACHOL) 40 MG tablet Take 40 mg by mouth at bedtime. 04/02/21  Yes [provider]  predniSONE (DELTASONE) 5 MG tablet Take 5 mg by mouth daily with breakfast.   Yes [provider]  tamoxifen (NOLVADEX) 20 MG tablet Take 20 mg by mouth daily. 02/27/21  Yes [provider]  ursodiol (ACTIGALL) 300 MG capsule Take 600 mg by mouth 2 (two) times daily. 04/02/21  Yes [provider]  venlafaxine XR (EFFEXOR-XR) 37.5 MG 24 hr capsule Take 37.5 mg by mouth daily. 01/14/20  Yes [provider]  VENTOLIN HFA 108 (90 Base) MCG/ACT inhaler Inhale 2 puffs into the lungs every 4 (four) hours as needed. 01/31/21  Yes [provider]  Vitamin D, Ergocalciferol, (DRISDOL) 1.25 MG (50000 UNIT) CAPS capsule Take 50,000 Units by mouth every 14 (fourteen) days. On Sundays   Yes [provider]  ADVAIR DISKUS 100-50 MCG/DOSE AEPB Inhale 1 puff into the lungs 2 (two) times daily. Patient not taking: Reported on 05/06/2021 04/15/20   [provider]  donepezil (ARICEPT) 5 MG tablet Take 5 mg by mouth at  bedtime. Patient not taking: Reported on 05/06/2021 03/21/21   [provider]    Review of Systems  Constitutional:  Positive for fatigue. Negative for appetite change.  HENT:  Negative for congestion and sore throat.   Eyes: Negative.   Respiratory:  Negative for cough, chest tightness and shortness of breath.   Cardiovascular:  Negative for chest pain, palpitations and leg swelling.  Gastrointestinal:  Negative for abdominal distention and abdominal pain.  Endocrine: Negative.   Genitourinary: Negative.   Musculoskeletal:  Negative for back pain and neck pain.  Skin: Negative.   Allergic/Immunologic: Negative.   Neurological:  Negative for dizziness and light-headedness.  Hematological:  Negative for adenopathy. Does not bruise/bleed easily.  Psychiatric/Behavioral:  Positive for confusion. Negative for sleep disturbance. The patient is not nervous/anxious.    Vitals:   05/06/21 0913 05/06/21 0927  BP: 132/70   Pulse: 72   Resp: 16   SpO2: 90% 100%  Weight: 162 lb 6 oz (73.7 kg)   Height: _0  (1.575 m)    Wt Readings from Last 3 Encounters:  05/06/21 162 lb 6 oz (73.7 kg)  04/28/21 169 lb 1.6 oz (76.7 kg)  04/07/21 170 lb 6.7 oz (77.3 kg)   Lab Results  Component Value Date   CREATININE 1.65 (H) 04/27/2021   CREATININE 1.61 (H) 04/26/2021   CREATININE 1.51 (H) 04/25/2021    Physical Exam Vitals and nursing note reviewed. Exam conducted with a chaperone present (daughter).  Constitutional:      Appearance: Normal appearance.  HENT:     Head: Normocephalic and atraumatic.  Cardiovascular:     Rate and Rhythm: Normal rate. Rhythm irregular.  Pulmonary:     Effort: Pulmonary effort is normal. No respiratory distress.     Breath sounds: No wheezing or rales.  Abdominal:     General: There is no distension.     Palpations: Abdomen is soft.  Musculoskeletal:        General: No tenderness.     Cervical back: Normal range of motion and neck supple.  Right lower leg: No edema.     Left lower leg: No edema.  Skin:    General: Skin is warm and dry.  Neurological:     General: No focal deficit present.     Mental Status: She is alert. Mental status is at baseline.  Psychiatric:        Mood and Affect: Mood normal.        Behavior: Behavior normal.    Assessment & Plan:  1: Chronic heart failure with preserved ejection fraction with structural changes (LAE)- - NYHA class III - euvolemic today - getting weighed weekly so an order was written to be weighed daily and to call for an overnight weight gain of > 2 pounds or a weekly weight gain of > 5 pounds - not adding salt at the facility - receiving speech therapy and PT - BNP 04/19/21 was 558.3  2: HTN with CKD- - BP looks good today - saw PCP (Gauger) 03/31/21 - BMP 04/27/21 reviewed and showed sodium 132, potassium 3.8, creatinine 1,65 and GFR 32 - getting weekly BMP drawn weekly for 4 weeks per SNF order  3: Atrial fibrillation- - saw cardiology Nehemiah Massed) 12/31/20 - on amiodarone and eliquis  4: COPD- - on chronic prednisone - arrived on room air with pulse ox of 90% but is supposed to be on 2L continuously so she was placed on 2L and pulse ox quickly rose to 100% - has nebulizer ordered TID but does not have advair or albuterol inhalers on the [facility] daughter is going to check into that as she says the albuterol is by patient's bedside    Facility medication list was reviewed.   Return in 2 months or sooner for any questions/problems before then.

## 2021-05-06 ENCOUNTER — Encounter: Payer: Self-pay | Admitting: Family

## 2021-05-06 ENCOUNTER — Other Ambulatory Visit: Payer: Self-pay

## 2021-05-06 ENCOUNTER — Ambulatory Visit: Payer: Medicare Other | Attending: Family | Admitting: Family

## 2021-05-06 VITALS — BP 132/70 | HR 72 | Resp 16 | Ht 62.0 in | Wt 162.4 lb

## 2021-05-06 DIAGNOSIS — N183 Chronic kidney disease, stage 3 unspecified: Secondary | ICD-10-CM | POA: Insufficient documentation

## 2021-05-06 DIAGNOSIS — Z7901 Long term (current) use of anticoagulants: Secondary | ICD-10-CM | POA: Insufficient documentation

## 2021-05-06 DIAGNOSIS — Z7952 Long term (current) use of systemic steroids: Secondary | ICD-10-CM | POA: Insufficient documentation

## 2021-05-06 DIAGNOSIS — I130 Hypertensive heart and chronic kidney disease with heart failure and stage 1 through stage 4 chronic kidney disease, or unspecified chronic kidney disease: Secondary | ICD-10-CM | POA: Diagnosis not present

## 2021-05-06 DIAGNOSIS — Z952 Presence of prosthetic heart valve: Secondary | ICD-10-CM | POA: Diagnosis not present

## 2021-05-06 DIAGNOSIS — I5032 Chronic diastolic (congestive) heart failure: Secondary | ICD-10-CM | POA: Diagnosis not present

## 2021-05-06 DIAGNOSIS — J449 Chronic obstructive pulmonary disease, unspecified: Secondary | ICD-10-CM | POA: Diagnosis not present

## 2021-05-06 DIAGNOSIS — I480 Paroxysmal atrial fibrillation: Secondary | ICD-10-CM

## 2021-05-06 DIAGNOSIS — Z79899 Other long term (current) drug therapy: Secondary | ICD-10-CM | POA: Diagnosis not present

## 2021-05-06 DIAGNOSIS — Z950 Presence of cardiac pacemaker: Secondary | ICD-10-CM | POA: Insufficient documentation

## 2021-05-06 DIAGNOSIS — I10 Essential (primary) hypertension: Secondary | ICD-10-CM

## 2021-05-06 NOTE — Patient Instructions (Signed)
Begin weighing daily and call for an overnight weight gain of > 2 pounds or a weekly weight gain of >5 pounds. 

## 2021-05-14 DIAGNOSIS — J449 Chronic obstructive pulmonary disease, unspecified: Secondary | ICD-10-CM | POA: Diagnosis not present

## 2021-05-20 DIAGNOSIS — Z8674 Personal history of sudden cardiac arrest: Secondary | ICD-10-CM | POA: Diagnosis not present

## 2021-05-20 DIAGNOSIS — J410 Simple chronic bronchitis: Secondary | ICD-10-CM | POA: Diagnosis not present

## 2021-05-20 DIAGNOSIS — I5033 Acute on chronic diastolic (congestive) heart failure: Secondary | ICD-10-CM | POA: Diagnosis not present

## 2021-05-27 ENCOUNTER — Other Ambulatory Visit: Payer: Medicare Other

## 2021-05-27 ENCOUNTER — Ambulatory Visit: Payer: Medicare Other | Admitting: Nurse Practitioner

## 2021-05-27 DIAGNOSIS — I469 Cardiac arrest, cause unspecified: Secondary | ICD-10-CM | POA: Diagnosis not present

## 2021-05-27 DIAGNOSIS — Q245 Malformation of coronary vessels: Secondary | ICD-10-CM | POA: Diagnosis not present

## 2021-05-27 DIAGNOSIS — E782 Mixed hyperlipidemia: Secondary | ICD-10-CM | POA: Diagnosis not present

## 2021-05-27 DIAGNOSIS — I5032 Chronic diastolic (congestive) heart failure: Secondary | ICD-10-CM | POA: Diagnosis not present

## 2021-05-30 ENCOUNTER — Inpatient Hospital Stay (HOSPITAL_BASED_OUTPATIENT_CLINIC_OR_DEPARTMENT_OTHER): Payer: Medicare Other | Admitting: Nurse Practitioner

## 2021-05-30 ENCOUNTER — Inpatient Hospital Stay: Payer: Medicare Other | Attending: Nurse Practitioner

## 2021-05-30 ENCOUNTER — Other Ambulatory Visit: Payer: Self-pay

## 2021-05-30 VITALS — BP 115/73 | HR 63 | Temp 97.4°F | Resp 18

## 2021-05-30 DIAGNOSIS — Z9011 Acquired absence of right breast and nipple: Secondary | ICD-10-CM | POA: Insufficient documentation

## 2021-05-30 DIAGNOSIS — D472 Monoclonal gammopathy: Secondary | ICD-10-CM

## 2021-05-30 DIAGNOSIS — Z7901 Long term (current) use of anticoagulants: Secondary | ICD-10-CM | POA: Diagnosis not present

## 2021-05-30 DIAGNOSIS — Z170 Estrogen receptor positive status [ER+]: Secondary | ICD-10-CM | POA: Insufficient documentation

## 2021-05-30 DIAGNOSIS — I130 Hypertensive heart and chronic kidney disease with heart failure and stage 1 through stage 4 chronic kidney disease, or unspecified chronic kidney disease: Secondary | ICD-10-CM | POA: Insufficient documentation

## 2021-05-30 DIAGNOSIS — D509 Iron deficiency anemia, unspecified: Secondary | ICD-10-CM | POA: Diagnosis not present

## 2021-05-30 DIAGNOSIS — M85851 Other specified disorders of bone density and structure, right thigh: Secondary | ICD-10-CM | POA: Insufficient documentation

## 2021-05-30 DIAGNOSIS — F0390 Unspecified dementia without behavioral disturbance: Secondary | ICD-10-CM | POA: Diagnosis not present

## 2021-05-30 DIAGNOSIS — D473 Essential (hemorrhagic) thrombocythemia: Secondary | ICD-10-CM | POA: Insufficient documentation

## 2021-05-30 DIAGNOSIS — C50911 Malignant neoplasm of unspecified site of right female breast: Secondary | ICD-10-CM | POA: Insufficient documentation

## 2021-05-30 DIAGNOSIS — N183 Chronic kidney disease, stage 3 unspecified: Secondary | ICD-10-CM | POA: Insufficient documentation

## 2021-05-30 DIAGNOSIS — E876 Hypokalemia: Secondary | ICD-10-CM | POA: Diagnosis not present

## 2021-05-30 DIAGNOSIS — Z923 Personal history of irradiation: Secondary | ICD-10-CM | POA: Diagnosis not present

## 2021-05-30 DIAGNOSIS — Z79899 Other long term (current) drug therapy: Secondary | ICD-10-CM | POA: Diagnosis not present

## 2021-05-30 DIAGNOSIS — Z0000 Encounter for general adult medical examination without abnormal findings: Secondary | ICD-10-CM | POA: Diagnosis not present

## 2021-05-30 DIAGNOSIS — Z8673 Personal history of transient ischemic attack (TIA), and cerebral infarction without residual deficits: Secondary | ICD-10-CM | POA: Diagnosis not present

## 2021-05-30 DIAGNOSIS — C50811 Malignant neoplasm of overlapping sites of right female breast: Secondary | ICD-10-CM

## 2021-05-30 LAB — COMPREHENSIVE METABOLIC PANEL
ALT: 15 U/L (ref 0–44)
AST: 23 U/L (ref 15–41)
Albumin: 3.3 g/dL — ABNORMAL LOW (ref 3.5–5.0)
Alkaline Phosphatase: 57 U/L (ref 38–126)
Anion gap: 9 (ref 5–15)
BUN: 34 mg/dL — ABNORMAL HIGH (ref 8–23)
CO2: 29 mmol/L (ref 22–32)
Calcium: 8.7 mg/dL — ABNORMAL LOW (ref 8.9–10.3)
Chloride: 95 mmol/L — ABNORMAL LOW (ref 98–111)
Creatinine, Ser: 1.46 mg/dL — ABNORMAL HIGH (ref 0.44–1.00)
GFR, Estimated: 37 mL/min — ABNORMAL LOW (ref >60)
Glucose, Bld: 86 mg/dL (ref 70–99)
Potassium: 3.3 mmol/L — ABNORMAL LOW (ref 3.5–5.1)
Sodium: 133 mmol/L — ABNORMAL LOW (ref 135–145)
Total Bilirubin: 0.3 mg/dL (ref 0.3–1.2)
Total Protein: 6.8 g/dL (ref 6.5–8.1)

## 2021-05-30 LAB — IRON AND TIBC
Iron: 45 ug/dL (ref 28–170)
Saturation Ratios: 12 % (ref 10.4–31.8)
TIBC: 368 ug/dL (ref 250–450)
UIBC: 323 ug/dL

## 2021-05-30 LAB — CBC WITH DIFFERENTIAL/PLATELET
Abs Immature Granulocytes: 0.03 10*3/uL (ref 0.00–0.07)
Basophils Absolute: 0.1 10*3/uL (ref 0.0–0.1)
Basophils Relative: 1 %
Eosinophils Absolute: 0.2 10*3/uL (ref 0.0–0.5)
Eosinophils Relative: 2 %
HCT: 31 % — ABNORMAL LOW (ref 36.0–46.0)
Hemoglobin: 10.2 g/dL — ABNORMAL LOW (ref 12.0–15.0)
Immature Granulocytes: 0 %
Lymphocytes Relative: 10 %
Lymphs Abs: 0.8 10*3/uL (ref 0.7–4.0)
MCH: 29.2 pg (ref 26.0–34.0)
MCHC: 32.9 g/dL (ref 30.0–36.0)
MCV: 88.8 fL (ref 80.0–100.0)
Monocytes Absolute: 0.6 10*3/uL (ref 0.1–1.0)
Monocytes Relative: 8 %
Neutro Abs: 6.4 10*3/uL (ref 1.7–7.7)
Neutrophils Relative %: 79 %
Platelets: 187 10*3/uL (ref 150–400)
RBC: 3.49 MIL/uL — ABNORMAL LOW (ref 3.87–5.11)
RDW: 14.2 % (ref 11.5–15.5)
WBC: 8.1 10*3/uL (ref 4.0–10.5)
nRBC: 0 % (ref 0.0–0.2)

## 2021-05-30 LAB — FERRITIN: Ferritin: 100 ng/mL (ref 11–307)

## 2021-05-30 NOTE — Progress Notes (Signed)
[facility]  [address] [place], [place] [address] Phone: [phone]  Fax: [phone]   Clinic Day:  05/30/2021  Referring physician: [name], MD  Chief Complaint: Adriana Martin is a 75 y.o. female with recurrent right breast cancer, JAK2+ thrombocytosis, and a monoclonal gammopathy of unknown significance (MGUS) who is seen for 4 month assessment.   HPI:  - Stage I right breast cancer ER/PR positive, HER2 negative s/p lumpectomy on 02/18/2011. Pathology revealed 1.1 cm, grade II ductal carcinoma. pT1c pN0 (sn). She received accelerated partial breast radiation followed by 5 years of arimidex completed 08/2016.    - Recurrent breast cancer: Surveillance mammogram on 07/12/19 revealed suspicious mass in right breast. Biopsy revealed 12 mm grade II invasive mammary carcinoma with lobular features, ER/PR positive. Consistent with recurrent disease.  She underwent right mastectomy on 08/09/19. Pathology revealed a 4.3 x 2.1 x 2.0 cm grade II invasive lobular carcinoma. 1 of 10 LNs positive for metastatic carcinoma. Tumor was ER/PR positive, HER2 1+, Pathologic stage mpT2 pN1a. Mammaprint was low risk. She received 3 week course of right breast radiation completed 03/08/20 and started tamoxifen in 2021.    - Chronic thrombocytosis since 2001. JAK2 V617F positive 12/14/2017. Bone marrow in 2001; report not available. Hydrea discontinued in 2017.   - MGUS- SPEP on 12/14/2017 revealed a 0.3 gm/dL IgG monoclonal protein with lambda light chain specificity.  Bone survey on 12/14/2017 revealed scattered lytic lesions throughout the calvarium concerning for metastases or myeloma.  There was compression fractures at T8 and L1 (new since 2017).  Bone survey on 07/27/2019 showed a few scattered lucencies were noted in the calvarium, unchanged. There were no lytic myelomatous lesions identified in the remainder of the axial or appendicular skeleton. There was  stable T8 and L1 compression fractures. There was a right total shoulder arthroplasty with remote appearing fracture below the prosthesis (2020).   - stage III chronic kidney disease.   - iron deficiency anemia.  Last colonoscopy was in 2012 (no report available).     - Osteopenia- Bone density on 12/18/2019   - Hx of ischemic stroke- 03/10/20 s/p TPA and thrombectomy. On eliquis  - cardiac arrest - 04/20/21- s/p ventricular tachycardia cardiac arrest s/p CPR and ACLS protocol; related to hypokalemia. EF was normal. On amiodarone.    Interval History: Patient is 75 year old female with above history of recurrent breast cancer currently on tamoxifen with history of MGUS, anemia, kidney disease, osteopenia.  Additionally, in the interim, she was hospitalized at [facility] after presenting with shortness of breath found to be hypoxic.  She went into V. tach cardiac arrest, received CPR, intubated and transferred to ICU on 04/20/2021.  V. tach thought to have occurred related to hypokalemia.  EF was normal.  She was started on amiodarone. She was discharged to rehab and then went home with 24 hour care. She continues oxygen. She has ongoing memory changes and continues donezepil. She is a poor historian but says she feels well.   Past Medical History:  Diagnosis Date   Alcohol use    Anemia    Anxiety    Atrial fibrillation ([place])    B12 deficiency    Breast cancer ([place])    RT Lumpectomy c radiation 2012   Breast cancer ([place])    CHF (congestive heart failure) (HCC)    Chronic diastolic CHF (congestive heart failure) ([place])    Chronic kidney disease    Cirrhosis ([place])    CKD (  chronic kidney disease), stage III (HCC)    COPD (chronic obstructive pulmonary disease) (HCC)    Dementia (HCC)    Depression    Family history of breast cancer    Family history of esophageal cancer    Family history of multiple myeloma    GERD (gastroesophageal reflux disease)    Headache    Heart murmur    HLD  (hyperlipidemia)    HTN (hypertension)    Hyperlipemia    Hyperlipidemia    Hypertension    Hypothyroid    Hypothyroidism    Iron (Fe) deficiency anemia    Monoclonal gammopathy    Pacemaker    Personal history of radiation therapy    Presence of permanent cardiac pacemaker    Shortness of breath dyspnea    Thrombocytopenia (HCC)    Vitamin D deficiency     Past Surgical History:  Procedure Laterality Date   ABDOMINAL HYSTERECTOMY     Partial   ABLATION     APPENDECTOMY     BICEPT TENODESIS Right 01/07/2018   Procedure: BICEPS TENODESIS;  Surgeon: Leim Fabry, MD;  Location: [facility] ORS;  Service: Orthopedics;  Laterality: Right;   BREAST BIOPSY Right 2012   BREAST BIOPSY Right 07/21/2019   venus clip, [place] Bx,positive   BREAST LUMPECTOMY Right 2012   positive/rad   BREAST SURGERY     CARDIAC SURGERY     CARDIAC VALVE REPLACEMENT     COLONOSCOPY  2012   ELECTROPHYSIOLOGIC STUDY N/A 01/23/2015   Procedure: CARDIOVERSION;  Surgeon: Corey Skains, MD;  Location: [facility] OR[facility];  Service: Cardiovascular;  Laterality: N/A;   ELECTROPHYSIOLOGIC STUDY N/A 05/27/2016   Procedure: CARDIOVERSION;  Surgeon: Corey Skains, MD;  Location: [facility];  Service: Cardiovascular;  Laterality: N/A;   ESOPHAGOGASTRODUODENOSCOPY  2012   EYE SURGERY     IR CT HEAD LTD  03/10/2020   IR PERCUTANEOUS ART THROMBECTOMY/INFUSION INTRACRANIAL INC DIAG ANGIO  03/10/2020   IR US GUIDE VASC ACCESS RIGHT  03/10/2020   MASTECTOMY Right    Mastectomy Right    MASTECTOMY WITH AXILLARY LYMPH NODE DISSECTION Right 08/09/2019   Procedure: MASTECTOMY WITH AXILLARY LYMPH NODE DISSECTION;  Surgeon: Robert Bellow, MD;  Location: [facility];  Service: General;  Laterality: Right;   MITRAL VALVE REPLACEMENT     mvp     PACEMAKER PLACEMENT N/A    RADIOLOGY WITH ANESTHESIA N/A 03/10/2020   Procedure: IR WITH ANESTHESIA;  Surgeon: Radiologist, Medication, MD;  Location: [facility];  Service: Radiology;  Laterality: N/A;    REVERSE SHOULDER ARTHROPLASTY Right 01/07/2018   Procedure: REVERSE SHOULDER ARTHROPLASTY;  Surgeon: Leim Fabry, MD;  Location: [facility];  Service: Orthopedics;  Laterality: Right;   VSD REPAIR     VSD REPAIR N/A     Family History  Problem Relation Age of Onset   Multiple myeloma Father    Esophageal cancer Brother    Breast cancer Mother        never treated, dx. >50   Esophageal cancer Brother    Breast cancer Daughter 86   Cancer Daughter 37       rare blood cancer    Social History:  reports that she has never smoked. She has never used smokeless tobacco. She reports that she does not currently use alcohol. She reports that she does not use drugs. She lives in [place]. She currently lives with her daughter because of her stroke in 02/2020. The patient is alone today. Her caregiver  brought her to the appointment.   Allergies:  Allergies  Allergen Reactions   Atorvastatin Shortness Of Breath   Calcium Shortness Of Breath   Fluoxetine     Other reaction(s): Unknown   Hydrochlorothiazide     Other reaction(s): Unknown    Current Medications: Current Outpatient Medications  Medication Sig Dispense Refill   acetaminophen (TYLENOL) 500 MG tablet Take 750 mg by mouth at bedtime as needed for mild pain. For jaw fracture     ADVAIR DISKUS 100-50 MCG/DOSE AEPB Inhale 1 puff into the lungs 2 (two) times daily.     amiodarone (PACERONE) 200 MG tablet Take 1 tablet (200 mg total) by mouth 2 (two) times daily.     B Complex-C (B-COMPLEX WITH VITAMIN C) tablet Take 1 tablet by mouth daily.     donepezil (ARICEPT) 5 MG tablet Take 5 mg by mouth at bedtime.     ELIQUIS 5 MG TABS tablet Take 5 mg by mouth 2 (two) times daily.     ezetimibe (ZETIA) 10 MG tablet Take 10 mg by mouth daily.     furosemide (LASIX) 40 MG tablet Take 1 tablet (40 mg total) by mouth daily. 30 tablet    ipratropium-albuterol (DUONEB) 0.5-2.5 (3) MG/3ML SOLN Take 3 mLs by nebulization 3 (three) times daily.      levothyroxine (SYNTHROID) 50 MCG tablet Take 50 mcg by mouth daily before breakfast.     melatonin 5 MG TABS Take 5 mg by mouth daily. Given at 5pm     Multiple Vitamin (MULTIVITAMIN) tablet Take 1 tablet by mouth daily.     pantoprazole (PROTONIX) 40 MG tablet Take 1 tablet (40 mg total) by mouth 2 (two) times daily.     polyethylene glycol (MIRALAX / GLYCOLAX) 17 g packet Take 17 g by mouth daily as needed for mild constipation. 14 each 0   potassium chloride SA (KLOR-CON) 20 MEQ tablet Take 1 tablet (20 mEq total) by mouth daily.     pravastatin (PRAVACHOL) 40 MG tablet Take 40 mg by mouth at bedtime.     predniSONE (DELTASONE) 5 MG tablet Take 5 mg by mouth daily with breakfast.     tamoxifen (NOLVADEX) 20 MG tablet Take 20 mg by mouth daily.     ursodiol (ACTIGALL) 300 MG capsule Take 600 mg by mouth 2 (two) times daily.     venlafaxine XR (EFFEXOR-XR) 37.5 MG 24 hr capsule Take 37.5 mg by mouth daily.     VENTOLIN HFA 108 (90 Base) MCG/ACT inhaler Inhale 2 puffs into the lungs every 4 (four) hours as needed.     Vitamin D, Ergocalciferol, (DRISDOL) 1.25 MG (50000 UNIT) CAPS capsule Take 50,000 Units by mouth every 14 (fourteen) days. On Sundays     No current facility-administered medications for this visit.   Review of Systems  Unable to perform ROS: Dementia  Performance status (ECOG): 3  Vitals Blood pressure 115/73, pulse 63, temperature (!) 97.4 F (36.3 C), temperature source Tympanic, resp. rate 18.   Physical Exam Nursing note reviewed.  Constitutional:      General: She is awake.     Appearance: She is well-developed and well-groomed. She is not ill-appearing.     Interventions: Nasal cannula in place.     Comments: In wheelchair. Unaccompanied  HENT:     Head: Normocephalic.  Eyes:     General: No scleral icterus. Cardiovascular:     Rate and Rhythm: Normal rate and regular rhythm.  Pulses: Normal pulses.  Pulmonary:     Effort: Pulmonary effort is normal. No  respiratory distress.     Breath sounds: Normal breath sounds.  Abdominal:     General: There is no distension.     Tenderness: There is no abdominal tenderness. There is no guarding.  Musculoskeletal:        General: No swelling or deformity.     Cervical back: Neck supple.  Lymphadenopathy:     Cervical: No cervical adenopathy.  Skin:    General: Skin is warm and dry.     Coloration: Skin is not pale.     Findings: Bruising and lesion (skin tear with bandage on RLE) present. No rash.  Neurological:     Mental Status: She is alert. Mental status is at baseline.  Psychiatric:        Attention and Perception: Attention normal.        Mood and Affect: Mood normal.        Behavior: Behavior is cooperative.        Cognition and Memory: Cognition is impaired. Memory is impaired.   Appointment on 05/30/2021  Component Date Value Ref Range Status   Sodium 05/30/2021 133 (A) 135 - 145 mmol/L Final   Potassium 05/30/2021 3.3 (A) 3.5 - 5.1 mmol/L Final   Chloride 05/30/2021 95 (A) 98 - 111 mmol/L Final   CO2 05/30/2021 29  22 - 32 mmol/L Final   Glucose, Bld 05/30/2021 86  70 - 99 mg/dL Final   Glucose reference range applies only to samples taken after fasting for at least 8 hours.   BUN 05/30/2021 34 (A) 8 - 23 mg/dL Final   Creatinine, Ser 05/30/2021 1.46 (A) 0.44 - 1.00 mg/dL Final   Calcium 05/30/2021 8.7 (A) 8.9 - 10.3 mg/dL Final   Total Protein 05/30/2021 6.8  6.5 - 8.1 g/dL Final   Albumin 05/30/2021 3.3 (A) 3.5 - 5.0 g/dL Final   AST 05/30/2021 23  15 - 41 U/L Final   ALT 05/30/2021 15  0 - 44 U/L Final   Alkaline Phosphatase 05/30/2021 57  38 - 126 U/L Final   Total Bilirubin 05/30/2021 0.3  0.3 - 1.2 mg/dL Final   GFR, Estimated 05/30/2021 37 (A) >60 mL/min Final   Comment: (NOTE) Calculated using the CKD-EPI Creatinine Equation (2021)    Anion gap 05/30/2021 9  5 - 15 Final   Performed at [facility], [address], [place], [place] [address]   WBC  05/30/2021 8.1  4.0 - 10.5 K/uL Final   RBC 05/30/2021 3.49 (A) 3.87 - 5.11 MIL/uL Final   Hemoglobin 05/30/2021 10.2 (A) 12.0 - 15.0 g/dL Final   HCT 05/30/2021 31.0 (A) 36.0 - 46.0 % Final   MCV 05/30/2021 88.8  80.0 - 100.0 fL Final   MCH 05/30/2021 29.2  26.0 - 34.0 pg Final   MCHC 05/30/2021 32.9  30.0 - 36.0 g/dL Final   RDW 05/30/2021 14.2  11.5 - 15.5 % Final   Platelets 05/30/2021 187  150 - 400 K/uL Final   nRBC 05/30/2021 0.0  0.0 - 0.2 % Final   Neutrophils Relative % 05/30/2021 79  % Final   Neutro Abs 05/30/2021 6.4  1.7 - 7.7 K/uL Final   Lymphocytes Relative 05/30/2021 10  % Final   Lymphs Abs 05/30/2021 0.8  0.7 - 4.0 K/uL Final   Monocytes Relative 05/30/2021 8  % Final   Monocytes Absolute 05/30/2021 0.6  0.1 - 1.0 K/uL  Final   Eosinophils Relative 05/30/2021 2  % Final   Eosinophils Absolute 05/30/2021 0.2  0.0 - 0.5 K/uL Final   Basophils Relative 05/30/2021 1  % Final   Basophils Absolute 05/30/2021 0.1  0.0 - 0.1 K/uL Final   Immature Granulocytes 05/30/2021 0  % Final   Abs Immature Granulocytes 05/30/2021 0.03  0.00 - 0.07 K/uL Final   Performed at [facility], [address] [place], [place] [address]    Assessment & Plan: Recurrent right breast cancer (2020)- initially diagnosed in 2012 s/p lumpectomy and SLNbx followed by accelerated partial breast radiation and 5 years of arimidex. Recurrence in 2020. S/p right mastectomy on 08/09/2019. Pathology revealed a 4.3 cm grade II invasive lobular carcinoma; 1 of 10 lymph nodes positive for metastatic carcinoma, ER/PR, positive, HER2 1+. Low risk mammaprint. Received 3 week course of radiation completed 03/08/20 followed by tamoxifen which she continues. Tolerating well. Clinically NED. Continue tamoxifen.  Left breast screening- last mammogram in October 2021 with Dr. Bary Castilla. Bi-rads category 1 with recommendation for screening mammo in 1 year. Will order and schedule today as I don't see follow up with  Dr. Bary Castilla scheduled.  Osteopenia- Bone density on 12/18/2019 confirmed osteopenia with a T-score of -2.1 in the right femoral neck. Continue calcium and vitamin D. Tamoxifen may improve bone density as well. Plan to repeat bone density scan in March 2023.  Essential thrombocytosis- She has an underlying myeloproliferative disorder (JAK2+). Original bone marrow report from 2001 is unavailable. Previously on hydrea. Platelet goal < 400,000. Today, platelets are normal.  Monoclonal gammopathy of unknown significance (MGUS)- M-spike was 0.2 g/dL on 06/06/2019 and 0 on 003/11/2020. Bone survey on 07/27/2019 revealed a few scattered stable lucencies were in the calvarium. Stable T8 and L1 compression fractures. will add mgus labs today. No evidence of crab criteria. Monitor.  Anemia- hx of ckd and iron deficiency. Will add ferritin and iron studies today. If deficient, can consider oral or IV iron. May also consider retacrit in the future if hemoglobin < 10. Monitor.  Dementia- on donezepil. Poor historian. I reached out to patient's daughter to give update and left vm. Reviewed plan of care with her caregiver today.  Hx of stroke- on eliquis Hx of cardiac arrest- secondary to hypokalemia. On amiodarone.  Hypokalemia- encouraged potassium supplementation. K is 3.3 today.  CKD- stage III. Avoid nephrotoxic substances. Monitor.   Disposition:  Mammogram in late October 3 months- labs (cbc, bmp, mm panel, light chains, ferritin, iron studies), MD to establish care.   I discussed the assessment and treatment plan with the patient.  She is poor historian and I've reached out to family. I reviewed findings and plan with her caregiver today. The patient and caregiver were provided an opportunity to ask questions and all were answered.  They agreed with the plan and demonstrated an understanding of the instructions.  They were advised to call back if the symptoms worsen or if the condition fails to improve as  anticipated.  Beckey Rutter, DNP, AGNP-C

## 2021-06-02 DIAGNOSIS — F331 Major depressive disorder, recurrent, moderate: Secondary | ICD-10-CM | POA: Diagnosis not present

## 2021-06-02 DIAGNOSIS — Z0000 Encounter for general adult medical examination without abnormal findings: Secondary | ICD-10-CM | POA: Diagnosis not present

## 2021-06-02 DIAGNOSIS — F411 Generalized anxiety disorder: Secondary | ICD-10-CM | POA: Diagnosis not present

## 2021-06-02 DIAGNOSIS — Z0001 Encounter for general adult medical examination with abnormal findings: Secondary | ICD-10-CM | POA: Diagnosis not present

## 2021-06-02 LAB — KAPPA/LAMBDA LIGHT CHAINS
Kappa free light chain: 68.4 mg/L — ABNORMAL HIGH (ref 3.3–19.4)
Kappa, lambda light chain ratio: 1.74 — ABNORMAL HIGH (ref 0.26–1.65)
Lambda free light chains: 39.4 mg/L — ABNORMAL HIGH (ref 5.7–26.3)

## 2021-06-13 LAB — MULTIPLE MYELOMA PANEL, SERUM
Albumin SerPl Elph-Mcnc: 3.1 g/dL (ref 2.9–4.4)
Albumin/Glob SerPl: 1 (ref 0.7–1.7)
Alpha 1: 0.4 g/dL (ref 0.0–0.4)
Alpha2 Glob SerPl Elph-Mcnc: 0.7 g/dL (ref 0.4–1.0)
B-Globulin SerPl Elph-Mcnc: 1 g/dL (ref 0.7–1.3)
Gamma Glob SerPl Elph-Mcnc: 1 g/dL (ref 0.4–1.8)
Globulin, Total: 3.2 g/dL (ref 2.2–3.9)
IgA: 345 mg/dL (ref 64–422)
IgG (Immunoglobin G), Serum: 978 mg/dL (ref 586–1602)
IgM (Immunoglobulin M), Srm: 70 mg/dL (ref 26–217)
M Protein SerPl Elph-Mcnc: 0.3 g/dL — ABNORMAL HIGH
Total Protein ELP: 6.3 g/dL (ref 6.0–8.5)

## 2021-06-14 DIAGNOSIS — J449 Chronic obstructive pulmonary disease, unspecified: Secondary | ICD-10-CM | POA: Diagnosis not present

## 2021-06-20 ENCOUNTER — Other Ambulatory Visit: Payer: Self-pay | Admitting: Nurse Practitioner

## 2021-07-01 DIAGNOSIS — I5032 Chronic diastolic (congestive) heart failure: Secondary | ICD-10-CM | POA: Diagnosis not present

## 2021-07-07 ENCOUNTER — Other Ambulatory Visit: Payer: Self-pay

## 2021-07-07 ENCOUNTER — Encounter: Payer: Self-pay | Admitting: Family

## 2021-07-07 ENCOUNTER — Ambulatory Visit: Payer: Medicare Other | Attending: Family | Admitting: Family

## 2021-07-07 VITALS — BP 128/75 | HR 61 | Resp 16 | Ht 61.0 in | Wt 168.0 lb

## 2021-07-07 DIAGNOSIS — N183 Chronic kidney disease, stage 3 unspecified: Secondary | ICD-10-CM | POA: Insufficient documentation

## 2021-07-07 DIAGNOSIS — I480 Paroxysmal atrial fibrillation: Secondary | ICD-10-CM | POA: Diagnosis not present

## 2021-07-07 DIAGNOSIS — E785 Hyperlipidemia, unspecified: Secondary | ICD-10-CM | POA: Diagnosis not present

## 2021-07-07 DIAGNOSIS — J449 Chronic obstructive pulmonary disease, unspecified: Secondary | ICD-10-CM | POA: Diagnosis not present

## 2021-07-07 DIAGNOSIS — Z79890 Hormone replacement therapy (postmenopausal): Secondary | ICD-10-CM | POA: Diagnosis not present

## 2021-07-07 DIAGNOSIS — Z79899 Other long term (current) drug therapy: Secondary | ICD-10-CM | POA: Insufficient documentation

## 2021-07-07 DIAGNOSIS — Z7951 Long term (current) use of inhaled steroids: Secondary | ICD-10-CM | POA: Insufficient documentation

## 2021-07-07 DIAGNOSIS — I10 Essential (primary) hypertension: Secondary | ICD-10-CM

## 2021-07-07 DIAGNOSIS — Z950 Presence of cardiac pacemaker: Secondary | ICD-10-CM | POA: Insufficient documentation

## 2021-07-07 DIAGNOSIS — Z7901 Long term (current) use of anticoagulants: Secondary | ICD-10-CM | POA: Insufficient documentation

## 2021-07-07 DIAGNOSIS — Z7952 Long term (current) use of systemic steroids: Secondary | ICD-10-CM | POA: Insufficient documentation

## 2021-07-07 DIAGNOSIS — I130 Hypertensive heart and chronic kidney disease with heart failure and stage 1 through stage 4 chronic kidney disease, or unspecified chronic kidney disease: Secondary | ICD-10-CM | POA: Insufficient documentation

## 2021-07-07 DIAGNOSIS — Z888 Allergy status to other drugs, medicaments and biological substances status: Secondary | ICD-10-CM | POA: Insufficient documentation

## 2021-07-07 DIAGNOSIS — I4891 Unspecified atrial fibrillation: Secondary | ICD-10-CM | POA: Diagnosis not present

## 2021-07-07 DIAGNOSIS — I5032 Chronic diastolic (congestive) heart failure: Secondary | ICD-10-CM

## 2021-07-07 NOTE — Progress Notes (Signed)
Patient ID: Adriana Martin, female    DOB: 01/31/1946, 75 y.o.   MRN: [id number]  Adriana Martin is a 75 y/o female with a history of breast cancer, hyperlipidemia, HTN, CKD, thyroid disease, anemia, anxiety, alcohol use, atrial fibrillation, dementia, cirrhosis, depression, COPD, monoclonal gammopathy and chronic heart failure.   Echo report from 04/05/21 reviewed and showed an EF of 60-65% along with severe LAE along with severe Adriana.   Admitted 04/19/21 due to shortness of breath. Developed cardiac arrest due to VT due to hypokalemia. Cardiology consult obtained. Amiodarone gtt started with transition to oral medications. Lasix initially held due to increasing creatinine & then resumed after creatinine improved. Discharged after 9 days.   She presents today for a follow up visit with a chief complaint of moderate fatigue with little exertion.   Personal aid is present and helps provide history. She  denies any shortness of breath, cough, chest pain, palpitations, pedal edema, abdominal distention, dizziness or difficulty sleeping. She weighs most days, reports weight 160-170. Today, 168 in the office.     Medication list nor medication bottles were present for review. Per cardiology note, she was to stop her amiodarone on 05/27/21. Patient nor aid were not sure if they stopped it or not, advised they would check when they get home.   Past Medical History:  Diagnosis Date   Alcohol use    Anemia    Anxiety    Atrial fibrillation ([place])    B12 deficiency    Breast cancer ([place])    RT Lumpectomy c radiation 2012   Breast cancer (HCC)    CHF (congestive heart failure) (HCC)    Chronic diastolic CHF (congestive heart failure) (HCC)    Chronic kidney disease    Cirrhosis (HCC)    CKD (chronic kidney disease), stage III (HCC)    COPD (chronic obstructive pulmonary disease) (HCC)    Dementia (HCC)    Depression    Family history of breast cancer    Family history of esophageal cancer    Family  history of multiple myeloma    GERD (gastroesophageal reflux disease)    Headache    Heart murmur    HLD (hyperlipidemia)    HTN (hypertension)    Hyperlipemia    Hyperlipidemia    Hypertension    Hypothyroid    Hypothyroidism    Iron (Fe) deficiency anemia    Monoclonal gammopathy    Pacemaker    Personal history of radiation therapy    Presence of permanent cardiac pacemaker    Shortness of breath dyspnea    Thrombocytopenia (HCC)    Vitamin D deficiency    Past Surgical History:  Procedure Laterality Date   ABDOMINAL HYSTERECTOMY     Partial   ABLATION     APPENDECTOMY     BICEPT TENODESIS Right 01/07/2018   Procedure: BICEPS TENODESIS;  Surgeon: Leim Fabry, MD;  Location: [facility];  Service: Orthopedics;  Laterality: Right;   BREAST BIOPSY Right 2012   BREAST BIOPSY Right 07/21/2019   venus clip, [place] Bx,positive   BREAST LUMPECTOMY Right 2012   positive/rad   BREAST SURGERY     CARDIAC SURGERY     CARDIAC VALVE REPLACEMENT     COLONOSCOPY  2012   ELECTROPHYSIOLOGIC STUDY N/A 01/23/2015   Procedure: CARDIOVERSION;  Surgeon: Corey Skains, MD;  Location: [facility];  Service: Cardiovascular;  Laterality: N/A;   ELECTROPHYSIOLOGIC STUDY N/A 05/27/2016   Procedure: CARDIOVERSION;  Surgeon: Corey Skains,  MD;  Location: [facility];  Service: Cardiovascular;  Laterality: N/A;   ESOPHAGOGASTRODUODENOSCOPY  2012   EYE SURGERY     IR CT HEAD LTD  03/10/2020   IR PERCUTANEOUS ART THROMBECTOMY/INFUSION INTRACRANIAL INC DIAG ANGIO  03/10/2020   IR US GUIDE VASC ACCESS RIGHT  03/10/2020   MASTECTOMY Right    Mastectomy Right    MASTECTOMY WITH AXILLARY LYMPH NODE DISSECTION Right 08/09/2019   Procedure: MASTECTOMY WITH AXILLARY LYMPH NODE DISSECTION;  Surgeon: Robert Bellow, MD;  Location: [facility];  Service: General;  Laterality: Right;   MITRAL VALVE REPLACEMENT     mvp     PACEMAKER PLACEMENT N/A    RADIOLOGY WITH ANESTHESIA N/A 03/10/2020   Procedure: IR WITH  ANESTHESIA;  Surgeon: Radiologist, Medication, MD;  Location: [facility];  Service: Radiology;  Laterality: N/A;   REVERSE SHOULDER ARTHROPLASTY Right 01/07/2018   Procedure: REVERSE SHOULDER ARTHROPLASTY;  Surgeon: Leim Fabry, MD;  Location: [facility];  Service: Orthopedics;  Laterality: Right;   VSD REPAIR     VSD REPAIR N/A    Family History  Problem Relation Age of Onset   Multiple myeloma Father    Esophageal cancer Brother    Breast cancer Mother        never treated, dx. >50   Esophageal cancer Brother    Breast cancer Daughter 48   Cancer Daughter 79       rare blood cancer   Social History   Tobacco Use   Smoking status: Never   Smokeless tobacco: Never  Substance Use Topics   Alcohol use: Not Currently    Comment: socially   Allergies  Allergen Reactions   Atorvastatin Shortness Of Breath   Calcium Shortness Of Breath   Fluoxetine     Other reaction(s): Unknown   Hydrochlorothiazide     Other reaction(s): Unknown   Prior to Admission medications   Medication Sig Start Date End Date Taking? Authorizing Provider  acetaminophen (TYLENOL) 500 MG tablet Take 750 mg by mouth at bedtime as needed for mild pain. For jaw fracture   Yes [provider]  amiodarone (PACERONE) 200 MG tablet Take 1 tablet (200 mg total) by mouth 2 (two) times daily. 04/28/21  Yes Nolberto Hanlon, MD  B Complex-C (B-COMPLEX WITH VITAMIN C) tablet Take 1 tablet by mouth daily.   Yes [provider]  ELIQUIS 5 MG TABS tablet Take 5 mg by mouth 2 (two) times daily. 03/05/21  Yes [provider]  ezetimibe (ZETIA) 10 MG tablet Take 10 mg by mouth daily. 02/05/21  Yes [provider]  furosemide (LASIX) 40 MG tablet Take 1 tablet (40 mg total) by mouth daily. 04/29/21  Yes Nolberto Hanlon, MD  ipratropium-albuterol (DUONEB) 0.5-2.5 (3) MG/3ML SOLN Take 3 mLs by nebulization 3 (three) times daily.   Yes [provider]  levothyroxine (SYNTHROID) 50 MCG tablet Take 50 mcg  by mouth daily before breakfast. 02/05/21  Yes [provider]  melatonin 5 MG TABS Take 5 mg by mouth daily. Given at 5pm   Yes [provider]  Multiple Vitamin (MULTIVITAMIN) tablet Take 1 tablet by mouth daily.   Yes [provider]  pantoprazole (PROTONIX) 40 MG tablet Take 1 tablet (40 mg total) by mouth 2 (two) times daily. 04/28/21  Yes Nolberto Hanlon, MD  polyethylene glycol (MIRALAX / GLYCOLAX) 17 g packet Take 17 g by mouth daily as needed for mild constipation. 04/28/21  Yes Nolberto Hanlon, MD  potassium chloride  SA (KLOR-CON) 20 MEQ tablet Take 1 tablet (20 mEq total) by mouth daily. 04/28/21  Yes Nolberto Hanlon, MD  pravastatin (PRAVACHOL) 40 MG tablet Take 40 mg by mouth at bedtime. 04/02/21  Yes [provider]  predniSONE (DELTASONE) 5 MG tablet Take 5 mg by mouth daily with breakfast.   Yes [provider]  tamoxifen (NOLVADEX) 20 MG tablet Take 20 mg by mouth daily. 02/27/21  Yes [provider]  ursodiol (ACTIGALL) 300 MG capsule Take 600 mg by mouth 2 (two) times daily. 04/02/21  Yes [provider]  venlafaxine XR (EFFEXOR-XR) 37.5 MG 24 hr capsule Take 75 mg by mouth daily. 01/14/20  Yes [provider]  VENTOLIN HFA 108 (90 Base) MCG/ACT inhaler Inhale 2 puffs into the lungs every 4 (four) hours as needed. 01/31/21  Yes [provider]  Vitamin D, Ergocalciferol, (DRISDOL) 1.25 MG (50000 UNIT) CAPS capsule Take 50,000 Units by mouth every 14 (fourteen) days. On Sundays   Yes [provider]  ADVAIR DISKUS 100-50 MCG/DOSE AEPB Inhale 1 puff into the lungs 2 (two) times daily. Patient not taking: Reported on 05/06/2021 04/15/20   [provider]  donepezil (ARICEPT) 5 MG tablet Take 5 mg by mouth at bedtime. Patient not taking: Reported on 05/06/2021 03/21/21   [provider]    Review of Systems  Constitutional:  Positive for fatigue. Negative for appetite change.  HENT:  Negative for  congestion and sore throat.   Eyes: Negative.   Respiratory:  Positive for shortness of breath (baseline, O2 continuous @ 2 lpm). Negative for cough and chest tightness.   Cardiovascular:  Negative for chest pain, palpitations and leg swelling.  Gastrointestinal:  Negative for abdominal distention and abdominal pain.  Endocrine: Negative.   Genitourinary: Negative.   Musculoskeletal:  Negative for back pain and neck pain.  Skin: Negative.   Allergic/Immunologic: Negative.   Neurological:  Negative for dizziness and light-headedness.  Hematological:  Negative for adenopathy. Bruises/bleeds easily.  Psychiatric/Behavioral:  Positive for confusion. Negative for sleep disturbance. The patient is not nervous/anxious.    Vitals:   07/07/21 1039  BP: 128/75  Pulse: 61  Resp: 16  SpO2: 95%  Weight: 168 lb (76.2 kg)  Height: 5' 1"  (1.549 m)   Wt Readings from Last 3 Encounters:  07/07/21 168 lb (76.2 kg)  05/06/21 162 lb 6 oz (73.7 kg)  04/28/21 169 lb 1.6 oz (76.7 kg)   Lab Results  Component Value Date   CREATININE 1.46 (H) 05/30/2021   CREATININE 1.65 (H) 04/27/2021   CREATININE 1.61 (H) 04/26/2021    Physical Exam Vitals and nursing note reviewed. Exam conducted with a chaperone present (personal aid).  Constitutional:      General: She is not in acute distress.    Appearance: Normal appearance.  HENT:     Head: Normocephalic and atraumatic.  Cardiovascular:     Rate and Rhythm: Normal rate. Rhythm irregular.  Pulmonary:     Effort: Pulmonary effort is normal. No respiratory distress.     Breath sounds: No wheezing or rales.  Abdominal:     General: There is no distension.     Palpations: Abdomen is soft.  Musculoskeletal:        General: No tenderness.     Cervical back: Normal range of motion and neck supple.     Right lower leg: No edema.     Left lower leg: No edema.  Skin:    General: Skin is warm  and dry.  Neurological:     General: No focal deficit present.      Mental Status: She is alert. Mental status is at baseline.  Psychiatric:        Mood and Affect: Mood normal.        Behavior: Behavior normal.    Assessment & Plan:  1: Chronic heart failure with preserved ejection fraction with structural changes (LAE)- - NYHA class III - euvolemic today - able to weigh most days as her activity will allow, reminded to call for an overnight weight gain of > 2 pounds or a weekly weight gain of > 5 pounds - not adding salt to her meals - BNP 04/19/21 was 558.3 - was to see cardiology Nehemiah Massed) on 07/01/21, but they went to the wrong location and have to r/s this  2: HTN with CKD- - BP looks good today, 128/75 - saw PCP ([facility]) 06/02/21 - BMP 05/30/21 reviewed and showed sodium 133, potassium 3.3, creatinine 1.46, and GFR 37  3: Atrial fibrillation- - was to see cardiology Nehemiah Massed) on 07/01/21, but they went to the wrong location & have to r/s this - on Eliquis - were not sure if they stopped amiodarone or not and will check meds when they get home  4: COPD- - on chronic prednisone - 2 lpm continually   Encouraged to bring medication bottles to each visit.   Return in 3 months or sooner for any questions/problems before then.

## 2021-07-07 NOTE — Patient Instructions (Signed)
Return in 3 months.  When you see your primary MD, make sure you bring your medications.

## 2021-07-14 DIAGNOSIS — J449 Chronic obstructive pulmonary disease, unspecified: Secondary | ICD-10-CM | POA: Diagnosis not present

## 2021-07-15 ENCOUNTER — Ambulatory Visit
Admission: RE | Admit: 2021-07-15 | Discharge: 2021-07-15 | Disposition: A | Discharge status: Home / Self Care | Payer: Medicare Other | Source: Ambulatory Visit | Attending: Nurse Practitioner | Admitting: Nurse Practitioner

## 2021-07-15 ENCOUNTER — Other Ambulatory Visit: Payer: Self-pay

## 2021-07-15 DIAGNOSIS — C50911 Malignant neoplasm of unspecified site of right female breast: Secondary | ICD-10-CM | POA: Diagnosis not present

## 2021-07-15 DIAGNOSIS — Z1231 Encounter for screening mammogram for malignant neoplasm of breast: Secondary | ICD-10-CM | POA: Diagnosis not present

## 2021-08-06 ENCOUNTER — Other Ambulatory Visit: Payer: Self-pay

## 2021-08-06 ENCOUNTER — Encounter: Payer: Self-pay | Admitting: Oncology

## 2021-08-06 ENCOUNTER — Inpatient Hospital Stay: Payer: Medicare Other | Admitting: Oncology

## 2021-08-06 ENCOUNTER — Inpatient Hospital Stay: Payer: Medicare Other | Attending: Nurse Practitioner

## 2021-08-06 VITALS — BP 122/69 | HR 72 | Temp 97.1°F | Resp 16 | Wt 166.9 lb

## 2021-08-06 DIAGNOSIS — Z79810 Long term (current) use of selective estrogen receptor modulators (SERMs): Secondary | ICD-10-CM | POA: Insufficient documentation

## 2021-08-06 DIAGNOSIS — C50911 Malignant neoplasm of unspecified site of right female breast: Secondary | ICD-10-CM | POA: Insufficient documentation

## 2021-08-06 DIAGNOSIS — D472 Monoclonal gammopathy: Secondary | ICD-10-CM

## 2021-08-06 DIAGNOSIS — D509 Iron deficiency anemia, unspecified: Secondary | ICD-10-CM

## 2021-08-06 DIAGNOSIS — Z9011 Acquired absence of right breast and nipple: Secondary | ICD-10-CM | POA: Insufficient documentation

## 2021-08-06 DIAGNOSIS — Z79899 Other long term (current) drug therapy: Secondary | ICD-10-CM | POA: Insufficient documentation

## 2021-08-06 DIAGNOSIS — D75839 Thrombocytosis, unspecified: Secondary | ICD-10-CM | POA: Diagnosis not present

## 2021-08-06 DIAGNOSIS — Z923 Personal history of irradiation: Secondary | ICD-10-CM | POA: Diagnosis not present

## 2021-08-06 DIAGNOSIS — Z170 Estrogen receptor positive status [ER+]: Secondary | ICD-10-CM | POA: Insufficient documentation

## 2021-08-06 LAB — CBC WITH DIFFERENTIAL/PLATELET
Abs Immature Granulocytes: 0.04 10*3/uL (ref 0.00–0.07)
Basophils Absolute: 0.1 10*3/uL (ref 0.0–0.1)
Basophils Relative: 1 %
Eosinophils Absolute: 0.3 10*3/uL (ref 0.0–0.5)
Eosinophils Relative: 3 %
HCT: 36.4 % (ref 36.0–46.0)
Hemoglobin: 11.5 g/dL — ABNORMAL LOW (ref 12.0–15.0)
Immature Granulocytes: 0 %
Lymphocytes Relative: 8 %
Lymphs Abs: 0.8 10*3/uL (ref 0.7–4.0)
MCH: 27.5 pg (ref 26.0–34.0)
MCHC: 31.6 g/dL (ref 30.0–36.0)
MCV: 87.1 fL (ref 80.0–100.0)
Monocytes Absolute: 0.7 10*3/uL (ref 0.1–1.0)
Monocytes Relative: 7 %
Neutro Abs: 8.3 10*3/uL — ABNORMAL HIGH (ref 1.7–7.7)
Neutrophils Relative %: 81 %
Platelets: 267 10*3/uL (ref 150–400)
RBC: 4.18 MIL/uL (ref 3.87–5.11)
RDW: 14.7 % (ref 11.5–15.5)
WBC: 10.2 10*3/uL (ref 4.0–10.5)
nRBC: 0 % (ref 0.0–0.2)

## 2021-08-06 LAB — BASIC METABOLIC PANEL
Anion gap: 11 (ref 5–15)
BUN: 36 mg/dL — ABNORMAL HIGH (ref 8–23)
CO2: 30 mmol/L (ref 22–32)
Calcium: 9.2 mg/dL (ref 8.9–10.3)
Chloride: 96 mmol/L — ABNORMAL LOW (ref 98–111)
Creatinine, Ser: 1.42 mg/dL — ABNORMAL HIGH (ref 0.44–1.00)
GFR, Estimated: 38 mL/min — ABNORMAL LOW (ref >60)
Glucose, Bld: 119 mg/dL — ABNORMAL HIGH (ref 70–99)
Potassium: 3.8 mmol/L (ref 3.5–5.1)
Sodium: 137 mmol/L (ref 135–145)

## 2021-08-06 LAB — IRON AND TIBC
Iron: 84 ug/dL (ref 28–170)
Saturation Ratios: 19 % (ref 10.4–31.8)
TIBC: 435 ug/dL (ref 250–450)
UIBC: 351 ug/dL

## 2021-08-06 LAB — FERRITIN: Ferritin: 87 ng/mL (ref 11–307)

## 2021-08-06 NOTE — Progress Notes (Signed)
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  She did not have biopsy-proven invasive mammary carcinoma with lobular features in October 2020.She underwent right mastectomy on 08/09/19. Pathology revealed a 4.3 x 2.1 x 2.0 cm grade II invasive lobular carcinoma. 1 of 10 LNs positive for metastatic carcinoma. Tumor was ER/PR positive, HER2 1+, Pathologic stage mpT2 pN1a. Mammaprint was low risk. She received 3 week course of right breast radiation completed 03/08/20 and started tamoxifen in 2021.    History of JAK2 positive thrombocytosis diagnosed in 2001 and bone marrow biopsy done back then.  Records not available.  She was on Hydrea for a while but then that was discontinued in 2017 and platelet counts have remained stable less than 400.  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  She is on home oxygen  ECOG PS- 2 Pain scale- 0   Review of systems- Review of Systems  Constitutional:  Positive for malaise/fatigue. Negative for chills, fever and weight loss.  HENT:  Negative for congestion, ear discharge and nosebleeds.   Eyes:  Negative for blurred vision.   Respiratory:  Negative for cough, hemoptysis, sputum production, shortness of breath and wheezing.   Cardiovascular:  Negative for chest pain, palpitations, orthopnea and claudication.  Gastrointestinal:  Negative for abdominal pain, blood in stool, constipation, diarrhea, heartburn, melena, nausea and vomiting.  Genitourinary:  Negative for dysuria, flank pain, frequency, hematuria and urgency.  Musculoskeletal:  Negative for back pain, joint pain and myalgias.  Skin:  Negative for rash.  Neurological:  Negative for dizziness, tingling, focal weakness, seizures, weakness and headaches.  Endo/Heme/Allergies:  Does not bruise/bleed easily.  Psychiatric/Behavioral:  Negative for depression and suicidal ideas. The patient does not have insomnia.      Allergies  Allergen Reactions   Atorvastatin Shortness Of Breath   Calcium Shortness Of Breath   Fluoxetine     Other reaction(s): Unknown   Hydrochlorothiazide     Other reaction(s): Unknown     Past Medical History:  Diagnosis Date   Alcohol use    Anemia    Anxiety    Atrial fibrillation (HCC)    B12 deficiency    Breast cancer (HCC)    RT Lumpectomy c radiation 2012   Breast cancer (HCC)    CHF (congestive heart failure) (HCC)    Chronic diastolic CHF (congestive heart failure) (HCC)    Chronic kidney disease    Cirrhosis (HCC)    CKD (chronic kidney disease), stage III (HCC)    COPD (chronic obstructive pulmonary disease) (HCC)    Dementia (HCC)    Depression    Family history of breast cancer    Family history of esophageal cancer    Family history of multiple myeloma    GERD (gastroesophageal  reflux disease)    Headache    Heart murmur    HLD (hyperlipidemia)    HTN (hypertension)    Hyperlipemia    Hyperlipidemia    Hypertension    Hypothyroid    Hypothyroidism    Iron (Fe) deficiency anemia    Monoclonal gammopathy    Pacemaker    Personal history of radiation therapy    Presence of permanent cardiac  pacemaker    Shortness of breath dyspnea    Thrombocytopenia (HCC)    Vitamin D deficiency      Past Surgical History:  Procedure Laterality Date   ABDOMINAL HYSTERECTOMY     Partial   ABLATION     APPENDECTOMY     BICEPT TENODESIS Right 01/07/2018   Procedure: BICEPS TENODESIS;  Surgeon: Leim Fabry, MD;  Location: [facility];  Service: Orthopedics;  Laterality: Right;   BREAST BIOPSY Right 2012   BREAST BIOPSY Right 07/21/2019   venus clip, [place] Bx,positive   BREAST LUMPECTOMY Right 2012   positive/rad   BREAST SURGERY     CARDIAC SURGERY     CARDIAC VALVE REPLACEMENT     COLONOSCOPY  2012   ELECTROPHYSIOLOGIC STUDY N/A 01/23/2015   Procedure: CARDIOVERSION;  Surgeon: Corey Skains, MD;  Location: [facility];  Service: Cardiovascular;  Laterality: N/A;   ELECTROPHYSIOLOGIC STUDY N/A 05/27/2016   Procedure: CARDIOVERSION;  Surgeon: Corey Skains, MD;  Location: [facility];  Service: Cardiovascular;  Laterality: N/A;   ESOPHAGOGASTRODUODENOSCOPY  2012   EYE SURGERY     IR CT HEAD LTD  03/10/2020   IR PERCUTANEOUS ART THROMBECTOMY/INFUSION INTRACRANIAL INC DIAG ANGIO  03/10/2020   IR US GUIDE VASC ACCESS RIGHT  03/10/2020   MASTECTOMY Right    Mastectomy Right    MASTECTOMY WITH AXILLARY LYMPH NODE DISSECTION Right 08/09/2019   Procedure: MASTECTOMY WITH AXILLARY LYMPH NODE DISSECTION;  Surgeon: Robert Bellow, MD;  Location: [facility];  Service: General;  Laterality: Right;   MITRAL VALVE REPLACEMENT     mvp     PACEMAKER PLACEMENT N/A    RADIOLOGY WITH ANESTHESIA N/A 03/10/2020   Procedure: IR WITH ANESTHESIA;  Surgeon: Radiologist, Medication, MD;  Location: [facility];  Service: Radiology;  Laterality: N/A;   REVERSE SHOULDER ARTHROPLASTY Right 01/07/2018   Procedure: REVERSE SHOULDER ARTHROPLASTY;  Surgeon: Leim Fabry, MD;  Location: [facility];  Service: Orthopedics;  Laterality: Right;   VSD REPAIR     VSD REPAIR N/A     Social History   Socioeconomic History   Marital  status: Divorced    Spouse name: Not on file   Number of children: Not on file   Years of education: Not on file   Highest education level: Not on file  Occupational History   Not on file  Tobacco Use   Smoking status: Never   Smokeless tobacco: Never  Vaping Use   Vaping Use: Never used  Substance and Sexual Activity   Alcohol use: Not Currently    Comment: socially   Drug use: Never   Sexual activity: Not on file  Other Topics Concern   Not on file  Social History Narrative   ** Merged History Encounter **       Social Determinants of Health   Financial Resource Strain: Not on file  Food Insecurity: Not on file  Transportation Needs: Not on file  Physical Activity: Not on file  Stress: Not on file  Social Connections: Not on file  Intimate Partner Violence:  Not on file    Family History  Problem Relation Age of Onset   Multiple myeloma Father    Esophageal cancer Brother    Breast cancer Mother        never treated, dx. >50   Esophageal cancer Brother    Breast cancer Daughter 6   Cancer Daughter 70       rare blood cancer     Current Outpatient Medications:    acetaminophen (TYLENOL) 500 MG tablet, Take 750 mg by mouth at bedtime as needed for mild pain. For jaw fracture, Disp: , Rfl:    ADVAIR DISKUS 100-50 MCG/DOSE AEPB, Inhale 1 puff into the lungs 2 (two) times daily., Disp: , Rfl:    B Complex-C (B-COMPLEX WITH VITAMIN C) tablet, Take 1 tablet by mouth daily., Disp: , Rfl:    Cholecalciferol 1.25 MG (50000 UT) capsule, Take by mouth., Disp: , Rfl:    ELIQUIS 5 MG TABS tablet, Take 5 mg by mouth 2 (two) times daily., Disp: , Rfl:    ezetimibe (ZETIA) 10 MG tablet, Take 10 mg by mouth daily., Disp: , Rfl:    furosemide (LASIX) 40 MG tablet, Take 1 tablet (40 mg total) by mouth daily., Disp: 30 tablet, Rfl:    ipratropium-albuterol (DUONEB) 0.5-2.5 (3) MG/3ML SOLN, Take 3 mLs by nebulization 3 (three) times daily., Disp: , Rfl:    levothyroxine  (SYNTHROID) 50 MCG tablet, Take 50 mcg by mouth daily before breakfast., Disp: , Rfl:    lisinopril (ZESTRIL) 5 MG tablet, TAKE (1) TABLET BY MOUTH EVERY DAY TO PROTECT KIDNEYS, Disp: , Rfl:    Multiple Vitamin (MULTIVITAMIN) tablet, Take 1 tablet by mouth daily., Disp: , Rfl:    pantoprazole (PROTONIX) 40 MG tablet, Take 1 tablet (40 mg total) by mouth 2 (two) times daily., Disp: , Rfl:    potassium chloride SA (KLOR-CON) 20 MEQ tablet, Take 1 tablet (20 mEq total) by mouth daily., Disp: , Rfl:    pravastatin (PRAVACHOL) 40 MG tablet, Take 40 mg by mouth at bedtime., Disp: , Rfl:    predniSONE (DELTASONE) 5 MG tablet, Take 5 mg by mouth daily with breakfast., Disp: , Rfl:    tamoxifen (NOLVADEX) 20 MG tablet, TAKE (1) TABLET BY MOUTH EVERY DAY, Disp: 90 tablet, Rfl: 3   ursodiol (ACTIGALL) 300 MG capsule, Take 600 mg by mouth 2 (two) times daily., Disp: , Rfl:    venlafaxine XR (EFFEXOR-XR) 37.5 MG 24 hr capsule, Take 75 mg by mouth daily., Disp: , Rfl:    VENTOLIN HFA 108 (90 Base) MCG/ACT inhaler, Inhale 2 puffs into the lungs every 4 (four) hours as needed., Disp: , Rfl:    Vitamin D, Ergocalciferol, (DRISDOL) 1.25 MG (50000 UNIT) CAPS capsule, Take 50,000 Units by mouth every 14 (fourteen) days. On Sundays, Disp: , Rfl:    amiodarone (PACERONE) 200 MG tablet, Take 1 tablet (200 mg total) by mouth 2 (two) times daily. (Patient not taking: Reported on 08/06/2021), Disp: , Rfl:    donepezil (ARICEPT) 5 MG tablet, Take 5 mg by mouth at bedtime. (Patient not taking: Reported on 08/06/2021), Disp: , Rfl:    melatonin 5 MG TABS, Take 5 mg by mouth daily. Given at 5pm (Patient not taking: Reported on 08/06/2021), Disp: , Rfl:    polyethylene glycol (MIRALAX / GLYCOLAX) 17 g packet, Take 17 g by mouth daily as needed for mild constipation. (Patient not taking: Reported on 08/06/2021), Disp: 14 each, Rfl: 0  Physical exam:  Vitals:   08/06/21 1039  BP: 122/69  Pulse: 72  Resp: 16  Temp: (!) 97.1 F  (36.2 C)  SpO2: 97%  Weight: 166 lb 14.4 oz (75.7 kg)   Physical Exam Constitutional:      Comments: Frail elderly woman sitting in a wheelchair.  She is on home oxygen  Cardiovascular:     Rate and Rhythm: Normal rate and regular rhythm.     Heart sounds: Normal heart sounds.  Pulmonary:     Effort: Pulmonary effort is normal.     Breath sounds: Normal breath sounds.     Comments: There is an area of erythema noted over the right back below the scapula possibly secondary to prior radiation treatment to the right breast Abdominal:     General: Bowel sounds are normal.     Palpations: Abdomen is soft.  Skin:    General: Skin is warm and dry.  Neurological:     Mental Status: She is alert and oriented to person, place, and time.  Breast exam: Patient is s/p right mastectomy without evidence of chest wall recurrence.  No palpable bilateral axillary adenopathy.  No palpable masses in the left breast.  CMP Latest Ref Rng & Units 08/06/2021  Glucose 70 - 99 mg/dL 119(H)  BUN 8 - 23 mg/dL 36(H)  Creatinine 0.44 - 1.00 mg/dL 1.42(H)  Sodium 135 - 145 mmol/L 137  Potassium 3.5 - 5.1 mmol/L 3.8  Chloride 98 - 111 mmol/L 96(L)  CO2 22 - 32 mmol/L 30  Calcium 8.9 - 10.3 mg/dL 9.2  Total Protein 6.5 - 8.1 g/dL -  Total Bilirubin 0.3 - 1.2 mg/dL -  Alkaline Phos 38 - 126 U/L -  AST 15 - 41 U/L -  ALT 0 - 44 U/L -   CBC Latest Ref Rng & Units 08/06/2021  WBC 4.0 - 10.5 K/uL 10.2  Hemoglobin 12.0 - 15.0 g/dL 11.5(L)  Hematocrit 36.0 - 46.0 % 36.4  Platelets 150 - 400 K/uL 267    No images are attached to the encounter.  MM 3D SCREEN BREAST UNI LEFT  Result Date: 07/17/2021 CLINICAL DATA:  Screening. EXAM: DIGITAL SCREENING UNILATERAL LEFT MAMMOGRAM WITH CAD AND TOMOSYNTHESIS TECHNIQUE: Left screening digital craniocaudal and mediolateral oblique mammograms were obtained. Left screening digital breast tomosynthesis was performed. The images were evaluated with computer-aided  detection. COMPARISON:  Previous exam(s). ACR Breast Density Category b: There are scattered areas of fibroglandular density. FINDINGS: The patient has had a right mastectomy. There are no findings suspicious for malignancy. IMPRESSION: No mammographic evidence of malignancy. A result letter of this screening mammogram will be mailed directly to the patient. RECOMMENDATION: Screening mammogram in one year.  (Code:SM-L-106M) BI-RADS CATEGORY  1: Negative. Electronically Signed   By: Dorise Bullion III M.D.   On: 07/17/2021 17:31    Assessment and plan- Patient is a 75 y.o. female who is here for follow-up of following medical issues  Recurrent ER positive right breast cancer: S/p right mastectomy and adjuvant radiation treatment.  She is presently on tamoxifen which she will continue until 2025.  Clinically she is doing well with no concerning signs and symptoms of recurrence based on today's exam.  Left breast mammogram from 2022 did not show any evidence of malignancy.  I will see her back in 6 months and she will need a bone density scan in March 2023. JAK2 positive thrombocytosis: Not currently on Hydrea.  Platelets at goal less than 400.  Continue to monitor IgG  lambda MGUS: Repeat myeloma panel and serum free light chains CBC with differential CMP in 6 months    Visit Diagnosis 1. MGUS (monoclonal gammopathy of unknown significance)   2. Iron deficiency anemia, unspecified iron deficiency anemia type   3. Recurrent breast cancer, right ([place])      Dr. Randa Evens, MD, MPH [facility] [phone] 08/06/2021 3:43 PM

## 2021-08-06 NOTE — Progress Notes (Signed)
Daughter states pt has been more lethargic and confused; believes it may be that she nis not sleeping well.

## 2021-08-07 LAB — KAPPA/LAMBDA LIGHT CHAINS
Kappa free light chain: 69.8 mg/L — ABNORMAL HIGH (ref 3.3–19.4)
Kappa, lambda light chain ratio: 1.82 — ABNORMAL HIGH (ref 0.26–1.65)
Lambda free light chains: 38.3 mg/L — ABNORMAL HIGH (ref 5.7–26.3)

## 2021-08-11 LAB — MULTIPLE MYELOMA PANEL, SERUM
Albumin SerPl Elph-Mcnc: 3.3 g/dL (ref 2.9–4.4)
Albumin/Glob SerPl: 1 (ref 0.7–1.7)
Alpha 1: 0.4 g/dL (ref 0.0–0.4)
Alpha2 Glob SerPl Elph-Mcnc: 0.8 g/dL (ref 0.4–1.0)
B-Globulin SerPl Elph-Mcnc: 1.2 g/dL (ref 0.7–1.3)
Gamma Glob SerPl Elph-Mcnc: 1 g/dL (ref 0.4–1.8)
Globulin, Total: 3.4 g/dL (ref 2.2–3.9)
IgA: 400 mg/dL (ref 64–422)
IgG (Immunoglobin G), Serum: 1086 mg/dL (ref 586–1602)
IgM (Immunoglobulin M), Srm: 79 mg/dL (ref 26–217)
Total Protein ELP: 6.7 g/dL (ref 6.0–8.5)

## 2021-08-12 DIAGNOSIS — H353132 Nonexudative age-related macular degeneration, bilateral, intermediate dry stage: Secondary | ICD-10-CM | POA: Diagnosis not present

## 2021-08-12 DIAGNOSIS — H524 Presbyopia: Secondary | ICD-10-CM | POA: Diagnosis not present

## 2021-08-14 DIAGNOSIS — J449 Chronic obstructive pulmonary disease, unspecified: Secondary | ICD-10-CM | POA: Diagnosis not present

## 2021-09-13 DIAGNOSIS — J449 Chronic obstructive pulmonary disease, unspecified: Secondary | ICD-10-CM | POA: Diagnosis not present

## 2021-09-24 ENCOUNTER — Other Ambulatory Visit: Payer: Self-pay

## 2021-09-24 ENCOUNTER — Encounter: Payer: Self-pay | Admitting: Radiation Oncology

## 2021-09-24 ENCOUNTER — Ambulatory Visit
Admission: RE | Admit: 2021-09-24 | Discharge: 2021-09-24 | Disposition: A | Discharge status: Home / Self Care | Payer: Medicare Other | Source: Ambulatory Visit | Attending: Radiation Oncology | Admitting: Radiation Oncology

## 2021-09-24 VITALS — BP 116/61 | HR 61 | Temp 97.3°F | Resp 18 | Wt 169.0 lb

## 2021-09-24 DIAGNOSIS — C50811 Malignant neoplasm of overlapping sites of right female breast: Secondary | ICD-10-CM

## 2021-09-24 DIAGNOSIS — Z170 Estrogen receptor positive status [ER+]: Secondary | ICD-10-CM | POA: Diagnosis not present

## 2021-09-24 DIAGNOSIS — Z08 Encounter for follow-up examination after completed treatment for malignant neoplasm: Secondary | ICD-10-CM | POA: Diagnosis not present

## 2021-09-24 NOTE — Progress Notes (Signed)
Radiation Oncology Follow up Note  Name: Adriana Martin   Date:   09/24/2021 MRN:  [id number] DOB: [birth year]    This 76 y.o. 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  Seen today in routine follow-up doing well without complaint.  Specifically denies chest wall pain or tenderness..  Recent mammogram of her left breast is BI-RADS 1 negative which I have reviewed.  She is also had an angio CT scan of her abdomen and pelvis showing no evidence of active GI bleeding does have evidence suggesting colitis.  She also has sigmoid diverticulosis.  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  COMPLICATIONS OF TREATMENT: none  FOLLOW UP COMPLIANCE: keeps appointments   PHYSICAL EXAM:  BP 116/61 (BP Location: Left Arm, Patient Position: Sitting)    Pulse 61    Temp (!) 97.3 F (36.3 C) (Tympanic)    Resp 18    Wt 169 lb (76.7 kg)    SpO2 100%    BMI 31.93 kg/m  Patient is status post right modified radical mastectomy chest wall is clear.  Left breast is free of dominant mass.  No axillary or supraclavicular adenopathy is identified.  Patient is on nasal oxygen wheelchair-bound and frail.  Well-developed well-nourished patient in NAD. HEENT reveals PERLA, EOMI, discs not visualized.  Oral cavity is clear. No oral mucosal lesions are identified. Neck is clear without evidence of cervical or supraclavicular adenopathy. Lungs are  clear to A&P. Cardiac examination is essentially unremarkable with regular rate and rhythm without murmur rub or thrill. Abdomen is benign with no organomegaly or masses noted. Motor sensory and DTR levels are equal and symmetric in the upper and lower extremities. Cranial nerves II through XII are grossly intact. Proprioception is intact. No peripheral adenopathy or edema is identified. No motor or sensory levels are noted. Crude visual fields are within normal range.  RADIOLOGY RESULTS: Mammogram of the left breast as well CT scan of abdomen pelvis reviewed compatible with above-stated findings  PLAN: At this time based on the patient's multiple medical comorbidities I will turn follow-up care over to medical oncology.  Transportation is an issue with her daughter also.  I be happy to reevaluate the patient anytime in the future should further consultation be indicated.  I would like to take this opportunity to thank you for allowing me to participate in the care of your patient.Noreene Filbert, MD

## 2021-10-07 ENCOUNTER — Ambulatory Visit: Payer: Medicare Other | Admitting: Family

## 2021-10-14 DIAGNOSIS — J449 Chronic obstructive pulmonary disease, unspecified: Secondary | ICD-10-CM | POA: Diagnosis not present

## 2021-10-17 ENCOUNTER — Encounter: Payer: Self-pay | Admitting: Family

## 2021-10-17 ENCOUNTER — Other Ambulatory Visit: Payer: Self-pay

## 2021-10-17 ENCOUNTER — Ambulatory Visit: Payer: Medicare Other | Attending: Family | Admitting: Family

## 2021-10-17 VITALS — BP 115/70 | HR 89 | Resp 18 | Ht 62.0 in | Wt 169.5 lb

## 2021-10-17 DIAGNOSIS — E039 Hypothyroidism, unspecified: Secondary | ICD-10-CM | POA: Diagnosis not present

## 2021-10-17 DIAGNOSIS — K7460 Unspecified cirrhosis of liver: Secondary | ICD-10-CM | POA: Insufficient documentation

## 2021-10-17 DIAGNOSIS — F32A Depression, unspecified: Secondary | ICD-10-CM | POA: Insufficient documentation

## 2021-10-17 DIAGNOSIS — C50911 Malignant neoplasm of unspecified site of right female breast: Secondary | ICD-10-CM | POA: Diagnosis not present

## 2021-10-17 DIAGNOSIS — I10 Essential (primary) hypertension: Secondary | ICD-10-CM | POA: Diagnosis not present

## 2021-10-17 DIAGNOSIS — Z7952 Long term (current) use of systemic steroids: Secondary | ICD-10-CM | POA: Diagnosis not present

## 2021-10-17 DIAGNOSIS — E785 Hyperlipidemia, unspecified: Secondary | ICD-10-CM | POA: Insufficient documentation

## 2021-10-17 DIAGNOSIS — N183 Chronic kidney disease, stage 3 unspecified: Secondary | ICD-10-CM | POA: Diagnosis not present

## 2021-10-17 DIAGNOSIS — Z803 Family history of malignant neoplasm of breast: Secondary | ICD-10-CM | POA: Insufficient documentation

## 2021-10-17 DIAGNOSIS — I4891 Unspecified atrial fibrillation: Secondary | ICD-10-CM | POA: Diagnosis not present

## 2021-10-17 DIAGNOSIS — I480 Paroxysmal atrial fibrillation: Secondary | ICD-10-CM

## 2021-10-17 DIAGNOSIS — I130 Hypertensive heart and chronic kidney disease with heart failure and stage 1 through stage 4 chronic kidney disease, or unspecified chronic kidney disease: Secondary | ICD-10-CM | POA: Diagnosis not present

## 2021-10-17 DIAGNOSIS — J449 Chronic obstructive pulmonary disease, unspecified: Secondary | ICD-10-CM | POA: Diagnosis not present

## 2021-10-17 DIAGNOSIS — Z9981 Dependence on supplemental oxygen: Secondary | ICD-10-CM | POA: Diagnosis not present

## 2021-10-17 DIAGNOSIS — Z79810 Long term (current) use of selective estrogen receptor modulators (SERMs): Secondary | ICD-10-CM | POA: Insufficient documentation

## 2021-10-17 DIAGNOSIS — Z7901 Long term (current) use of anticoagulants: Secondary | ICD-10-CM | POA: Diagnosis not present

## 2021-10-17 DIAGNOSIS — F419 Anxiety disorder, unspecified: Secondary | ICD-10-CM | POA: Diagnosis not present

## 2021-10-17 DIAGNOSIS — D472 Monoclonal gammopathy: Secondary | ICD-10-CM | POA: Diagnosis not present

## 2021-10-17 DIAGNOSIS — I5032 Chronic diastolic (congestive) heart failure: Secondary | ICD-10-CM | POA: Diagnosis not present

## 2021-10-17 DIAGNOSIS — Z853 Personal history of malignant neoplasm of breast: Secondary | ICD-10-CM | POA: Diagnosis not present

## 2021-10-17 DIAGNOSIS — Z8674 Personal history of sudden cardiac arrest: Secondary | ICD-10-CM | POA: Diagnosis not present

## 2021-10-17 DIAGNOSIS — F0390 Unspecified dementia without behavioral disturbance: Secondary | ICD-10-CM | POA: Diagnosis not present

## 2021-10-17 DIAGNOSIS — D649 Anemia, unspecified: Secondary | ICD-10-CM | POA: Insufficient documentation

## 2021-10-17 DIAGNOSIS — F1091 Alcohol use, unspecified, in remission: Secondary | ICD-10-CM | POA: Diagnosis not present

## 2021-10-17 DIAGNOSIS — C50919 Malignant neoplasm of unspecified site of unspecified female breast: Secondary | ICD-10-CM

## 2021-10-17 MED ORDER — EMPAGLIFLOZIN 10 MG PO TABS: 10.0000 mg | ORAL_TABLET | Freq: Every day | ORAL | 5 refills | Status: DC

## 2021-10-17 NOTE — Progress Notes (Signed)
Patient ID: Adriana Martin, female    DOB: 08-28-1946, 76 y.o.   MRN: [id number]  Ms Selway is a 76 y/o female with a history of breast cancer, hyperlipidemia, HTN, CKD, thyroid disease, anemia, anxiety, alcohol use, atrial fibrillation, dementia, cirrhosis, depression, COPD, monoclonal gammopathy and chronic heart failure.   Echo report from 04/05/21 reviewed and showed an EF of 60-65% along with severe LAE along with severe MS.   Admitted 04/19/21 due to shortness of breath. Developed cardiac arrest due to VT due to hypokalemia. Cardiology consult obtained. Amiodarone gtt started with transition to oral medications. Lasix initially held due to increasing creatinine & then resumed after creatinine improved. Discharged after 9 days.   She presents today for a follow up visit with a chief complaint of minimal shortness of breath upon moderate exertion. She describes this as chronic in nature having been present for several years. She has associated fatigue & confusion (unchanged) along with this. She denies any difficulty sleeping, dizziness, abdominal distention, palpitations, pedal edema, chest pain, cough or weight gain.   Past Medical History:  Diagnosis Date   Alcohol use    Anemia    Anxiety    Atrial fibrillation ([place])    B12 deficiency    Breast cancer ([place])    RT Lumpectomy c radiation 2012   Breast cancer (H[facility])    CHF (congestive heart failure) (HCC)    Chronic diastolic CHF (congestive heart failure) (HCC)    Chronic kidney disease    Cirrhosis (HCC)    CKD (chronic kidney disease), stage III (HCC)    COPD (chronic obstructive pulmonary disease) (HCC)    Dementia (HCC)    Depression    Family history of breast cancer    Family history of esophageal cancer    Family history of multiple myeloma    GERD (gastroesophageal reflux disease)    Headache    Heart murmur    HLD (hyperlipidemia)    HTN (hypertension)    Hyperlipemia    Hyperlipidemia    Hypertension     Hypothyroid    Hypothyroidism    Iron (Fe) deficiency anemia    Monoclonal gammopathy    Pacemaker    Personal history of radiation therapy    Presence of permanent cardiac pacemaker    Shortness of breath dyspnea    Thrombocytopenia (HCC)    Vitamin D deficiency    Past Surgical History:  Procedure Laterality Date   ABDOMINAL HYSTERECTOMY     Partial   ABLATION     APPENDECTOMY     BICEPT TENODESIS Right 01/07/2018   Procedure: BICEPS TENODESIS;  Surgeon: Leim Fabry, MD;  Location: [facility];  Service: Orthopedics;  Laterality: Right;   BREAST BIOPSY Right 2012   BREAST BIOPSY Right 07/21/2019   venus clip, [place] Bx,positive   BREAST LUMPECTOMY Right 2012   positive/rad   BREAST SURGERY     CARDIAC SURGERY     CARDIAC VALVE REPLACEMENT     COLONOSCOPY  2012   ELECTROPHYSIOLOGIC STUDY N/A 01/23/2015   Procedure: CARDIOVERSION;  Surgeon: Corey Skains, MD;  Location: [facility];  Service: Cardiovascular;  Laterality: N/A;   ELECTROPHYSIOLOGIC STUDY N/A 05/27/2016   Procedure: CARDIOVERSION;  Surgeon: Corey Skains, MD;  Location: [facility];  Service: Cardiovascular;  Laterality: N/A;   ESOPHAGOGASTRODUODENOSCOPY  2012   EYE SURGERY     IR CT HEAD LTD  03/10/2020   IR PERCUTANEOUS ART THROMBECTOMY/INFUSION INTRACRANIAL INC DIAG ANGIO  03/10/2020  IR US GUIDE VASC ACCESS RIGHT  03/10/2020   MASTECTOMY Right    Mastectomy Right    MASTECTOMY WITH AXILLARY LYMPH NODE DISSECTION Right 08/09/2019   Procedure: MASTECTOMY WITH AXILLARY LYMPH NODE DISSECTION;  Surgeon: Robert Bellow, MD;  Location: [facility];  Service: General;  Laterality: Right;   MITRAL VALVE REPLACEMENT     mvp     PACEMAKER PLACEMENT N/A    RADIOLOGY WITH ANESTHESIA N/A 03/10/2020   Procedure: IR WITH ANESTHESIA;  Surgeon: Radiologist, Medication, MD;  Location: [place];  Service: Radiology;  Laterality: N/A;   REVERSE SHOULDER ARTHROPLASTY Right 01/07/2018   Procedure: REVERSE SHOULDER ARTHROPLASTY;  Surgeon:  Leim Fabry, MD;  Location: [facility];  Service: Orthopedics;  Laterality: Right;   VSD REPAIR     VSD REPAIR N/A    Family History  Problem Relation Age of Onset   Multiple myeloma Father    Esophageal cancer Brother    Breast cancer Mother        never treated, dx. >50   Esophageal cancer Brother    Breast cancer Daughter 109   Cancer Daughter 5       rare blood cancer   Social History   Tobacco Use   Smoking status: Never   Smokeless tobacco: Never  Substance Use Topics   Alcohol use: Not Currently    Comment: socially   Allergies  Allergen Reactions   Atorvastatin Shortness Of Breath   Calcium Shortness Of Breath   Fluoxetine     Other reaction(s): Unknown   Hydrochlorothiazide     Other reaction(s): Unknown   Prior to Admission medications   Medication Sig Start Date End Date Taking? Authorizing Provider  acetaminophen (TYLENOL) 500 MG tablet Take 750 mg by mouth at bedtime as needed for mild pain. For jaw fracture   Yes [provider]  ADVAIR DISKUS 100-50 MCG/DOSE AEPB Inhale 1 puff into the lungs 2 (two) times daily. 04/15/20  Yes [provider]  B Complex-C (B-COMPLEX WITH VITAMIN C) tablet Take 1 tablet by mouth daily.   Yes [provider]  Cholecalciferol 1.25 MG (50000 UT) capsule Take by mouth. 04/28/21  Yes [provider]  donepezil (ARICEPT) 5 MG tablet Take 5 mg by mouth at bedtime. 03/21/21  Yes [provider]  ELIQUIS 5 MG TABS tablet Take 5 mg by mouth 2 (two) times daily. 03/05/21  Yes [provider]  ezetimibe (ZETIA) 10 MG tablet Take 10 mg by mouth daily. 02/05/21  Yes [provider]  furosemide (LASIX) 40 MG tablet Take 1 tablet (40 mg total) by mouth daily. 04/29/21  Yes Nolberto Hanlon, MD  levothyroxine (SYNTHROID) 50 MCG tablet Take 50 mcg by mouth daily before breakfast. 02/05/21  Yes [provider]  lisinopril (ZESTRIL) 5 MG tablet TAKE (1) TABLET BY MOUTH EVERY DAY TO PROTECT  KIDNEYS 07/21/21  Yes [provider]  Multiple Vitamin (MULTIVITAMIN) tablet Take 1 tablet by mouth daily.   Yes [provider]  pantoprazole (PROTONIX) 40 MG tablet Take 1 tablet (40 mg total) by mouth 2 (two) times daily. 04/28/21  Yes Nolberto Hanlon, MD  pravastatin (PRAVACHOL) 40 MG tablet Take 40 mg by mouth at bedtime. 04/02/21  Yes [provider]  predniSONE (DELTASONE) 5 MG tablet Take 5 mg by mouth daily with breakfast.   Yes [provider]  tamoxifen (NOLVADEX) 20 MG tablet TAKE (1) TABLET BY MOUTH EVERY DAY 06/20/21  Yes Verlon Au, NP  ursodiol (ACTIGALL) 300 MG capsule Take 600 mg by mouth 2 (two) times daily. 04/02/21  Yes [provider]  venlafaxine XR (EFFEXOR-XR) 37.5 MG 24 hr capsule Take 75 mg by mouth daily. 01/14/20  Yes [provider]  VENTOLIN HFA 108 (90 Base) MCG/ACT inhaler Inhale 2 puffs into the lungs every 4 (four) hours as needed. 01/31/21  Yes [provider]  Vitamin D, Ergocalciferol, (DRISDOL) 1.25 MG (50000 UNIT) CAPS capsule Take 50,000 Units by mouth every 14 (fourteen) days. On Sundays   Yes [provider]  amiodarone (PACERONE) 200 MG tablet Take 1 tablet (200 mg total) by mouth 2 (two) times daily. Patient not taking: Reported on 08/06/2021 04/28/21   Nolberto Hanlon, MD  ipratropium-albuterol (DUONEB) 0.5-2.5 (3) MG/3ML SOLN Take 3 mLs by nebulization 3 (three) times daily. Patient not taking: Reported on 10/17/2021    [provider]  melatonin 5 MG TABS Take 5 mg by mouth daily. Given at 5pm Patient not taking: Reported on 08/06/2021    [provider]  polyethylene glycol (MIRALAX / GLYCOLAX) 17 g packet Take 17 g by mouth daily as needed for mild constipation. Patient not taking: Reported on 08/06/2021 04/28/21   Nolberto Hanlon, MD  potassium chloride SA (KLOR-CON) 20 MEQ tablet Take 1 tablet (20 mEq total) by mouth daily. Patient taking differently: Take 40 mEq by mouth  daily. 04/28/21   Nolberto Hanlon, MD   Review of Systems  Constitutional:  Positive for fatigue. Negative for appetite change.  HENT:  Negative for congestion and sore throat.   Eyes: Negative.   Respiratory:  Positive for shortness of breath (baseline, O2 continuous @ 2 lpm). Negative for cough and chest tightness.   Cardiovascular:  Negative for chest pain, palpitations and leg swelling.  Gastrointestinal:  Negative for abdominal distention and abdominal pain.  Endocrine: Negative.   Genitourinary: Negative.   Musculoskeletal:  Negative for back pain and neck pain.  Skin: Negative.   Allergic/Immunologic: Negative.   Neurological:  Negative for dizziness and light-headedness.  Hematological:  Negative for adenopathy. Bruises/bleeds easily.  Psychiatric/Behavioral:  Positive for confusion. Negative for sleep disturbance. The patient is not nervous/anxious.    Vitals:   10/17/21 1346  BP: 115/70  Pulse: 89  Resp: 18  SpO2: 100%  Weight: 169 lb 8 oz (76.9 kg)  Height: 5' 2"  (1.575 m)   Wt Readings from Last 3 Encounters:  10/17/21 169 lb 8 oz (76.9 kg)  09/24/21 169 lb (76.7 kg)  08/06/21 166 lb 14.4 oz (75.7 kg)   Lab Results  Component Value Date   CREATININE 1.42 (H) 08/06/2021   CREATININE 1.46 (H) 05/30/2021   CREATININE 1.65 (H) 04/27/2021   Physical Exam Vitals and nursing note reviewed. Exam conducted with a chaperone present (personal aid).  Constitutional:      General: She is not in acute distress.    Appearance: Normal appearance.  HENT:     Head: Normocephalic and atraumatic.  Cardiovascular:     Rate and Rhythm: Normal rate. Rhythm irregular.  Pulmonary:     Effort: Pulmonary effort is normal. No respiratory distress.     Breath sounds: No wheezing or rales.  Abdominal:     General: There is no distension.     Palpations: Abdomen is soft.  Musculoskeletal:        General: No tenderness.     Cervical back: Normal range of motion and neck supple.      Right lower leg: No edema.  Left lower leg: No edema.  Skin:    General: Skin is warm and dry.  Neurological:     General: No focal deficit present.     Mental Status: She is alert. Mental status is at baseline.  Psychiatric:        Mood and Affect: Mood normal.        Behavior: Behavior normal.    Assessment & Plan:  1: Chronic heart failure with preserved ejection fraction with structural changes (LAE)- - NYHA class III - euvolemic today - able to weigh most days as her activity will allow, reminded to call for an overnight weight gain of > 2 pounds or a weekly weight gain of > 5 pounds - weight stable from last visit here 3 months ago - not adding salt to her meals - saw cardiology Nehemiah Massed) 05/27/21 - will add jardiance 2m daily; 30 day voucher provided - will check BMP at next visit - depending on BP, discussed changing her lisinopril to entresto - BNP 04/19/21 was 558.3  2: HTN with CKD- - BP looks good (115/70) - saw PCP ([place]) 06/02/21 - BMP 08/06/21 reviewed and showed sodium 137, potassium 3.8, creatinine 1.42, and GFR 38  3: Atrial fibrillation- - was to see cardiology (Nehemiah Massed on 07/01/21, but they went to the wrong location & have to r/s this - on Eliquis  4: COPD- - on chronic prednisone - 2 lpm continually  5: Right Breast cancer- - saw oncology (Janese Banks 08/06/21 - continues on tamoxifen   Patient did not bring her medications nor a list. Each medication was verbally reviewed with the patient and she was encouraged to bring the bottles to every visit to confirm accuracy of list.   Return in 1 month, sooner if needed

## 2021-10-17 NOTE — Patient Instructions (Addendum)
Continue weighing daily and call for an overnight weight gain of 3 pounds or more or a weekly weight gain of more than 5 pounds.  The [facility] Failure Clinic will be moving around the corner to [id number] mid-February. Our phone number will remain the same  Will begin jardiance 10mg  as 1 tablet every morning

## 2021-11-12 DIAGNOSIS — L570 Actinic keratosis: Secondary | ICD-10-CM | POA: Diagnosis not present

## 2021-11-12 DIAGNOSIS — D485 Neoplasm of uncertain behavior of skin: Secondary | ICD-10-CM | POA: Diagnosis not present

## 2021-11-12 DIAGNOSIS — C44629 Squamous cell carcinoma of skin of left upper limb, including shoulder: Secondary | ICD-10-CM | POA: Diagnosis not present

## 2021-11-14 DIAGNOSIS — J449 Chronic obstructive pulmonary disease, unspecified: Secondary | ICD-10-CM | POA: Diagnosis not present

## 2021-11-18 ENCOUNTER — Telehealth: Payer: Self-pay | Admitting: Family

## 2021-11-18 ENCOUNTER — Ambulatory Visit: Payer: Medicare Other | Admitting: Family

## 2021-11-18 NOTE — Telephone Encounter (Signed)
Patient did not show for her [facility] appointment on 11/18/21. Will attempt to reschedule.

## 2021-12-02 DIAGNOSIS — I5032 Chronic diastolic (congestive) heart failure: Secondary | ICD-10-CM | POA: Diagnosis not present

## 2021-12-08 ENCOUNTER — Inpatient Hospital Stay: Admission: RE | Admit: 2021-12-08 | Payer: Medicare Other | Source: Ambulatory Visit

## 2021-12-12 DIAGNOSIS — J449 Chronic obstructive pulmonary disease, unspecified: Secondary | ICD-10-CM | POA: Diagnosis not present

## 2021-12-18 DIAGNOSIS — L578 Other skin changes due to chronic exposure to nonionizing radiation: Secondary | ICD-10-CM | POA: Diagnosis not present

## 2021-12-18 DIAGNOSIS — L821 Other seborrheic keratosis: Secondary | ICD-10-CM | POA: Diagnosis not present

## 2021-12-18 DIAGNOSIS — C801 Malignant (primary) neoplasm, unspecified: Secondary | ICD-10-CM | POA: Diagnosis not present

## 2021-12-18 DIAGNOSIS — L570 Actinic keratosis: Secondary | ICD-10-CM | POA: Diagnosis not present

## 2021-12-18 NOTE — Progress Notes (Signed)
? Patient ID: Adriana Martin, female    DOB: Aug 07, 1946, 76 y.o.   MRN: [id number] ? ?Ms Tumlin is a 76 y/o female with a history of breast cancer, hyperlipidemia, HTN, CKD, thyroid disease, anemia, anxiety, alcohol use, atrial fibrillation, dementia, cirrhosis, depression, COPD, monoclonal gammopathy and chronic heart failure.  ? ?Echo report from 04/05/21 reviewed and showed an EF of 60-65% along with severe LAE along with severe MS.  ? ?Has not been admitted or been in the ED in the last 6 months.  ? ?She presents today for a follow up visit with a chief complaint of moderate shortness of breath with minimal exertion. Describes this as chronic although has worsened over the last several weeks. She has associated fatigue, cough, confusion (chronic) and weakness along with this. She denies any dizziness, abdominal distention, palpitations, pedal edema, chest pain or weight gain.  ? ?Daughter says that it's becoming more difficult to get patient out of the home to come to appointments because of her weakness. Says that it takes so much out of her.  ? ?Took the month of jardiance but then didn't continue it because they didn't know if they were to stay on it.  ? ?Past Medical History:  ?Diagnosis Date  ? Alcohol use   ? Anemia   ? Anxiety   ? Atrial fibrillation ([place])   ? B12 deficiency   ? Breast cancer ([place])   ? RT Lumpectomy c radiation 2012  ? Breast cancer ([place])   ? CHF (congestive heart failure) ([place])   ? Chronic diastolic CHF (congestive heart failure) ([place])   ? Chronic kidney disease   ? Cirrhosis ([place])   ? CKD (chronic kidney disease), stage III ([place])   ? COPD (chronic obstructive pulmonary disease) ([place])   ? Dementia ([place])   ? Depression   ? Family history of breast cancer   ? Family history of esophageal cancer   ? Family history of multiple myeloma   ? GERD (gastroesophageal reflux disease)   ? Headache   ? Heart murmur   ? HLD (hyperlipidemia)   ? HTN (hypertension)   ? Hyperlipemia   ? Hyperlipidemia    ? Hypertension   ? Hypothyroid   ? Hypothyroidism   ? Iron (Fe) deficiency anemia   ? Monoclonal gammopathy   ? Pacemaker   ? Personal history of radiation therapy   ? Presence of permanent cardiac pacemaker   ? Shortness of breath dyspnea   ? Thrombocytopenia ([place])   ? Vitamin D deficiency   ? ?Past Surgical History:  ?Procedure Laterality Date  ? ABDOMINAL HYSTERECTOMY    ? Partial  ? ABLATION    ? APPENDECTOMY    ? BICEPT TENODESIS Right 01/07/2018  ? Procedure: BICEPS TENODESIS;  Surgeon: Leim Fabry, MD;  Location: [facility];  Service: Orthopedics;  Laterality: Right;  ? BREAST BIOPSY Right 2012  ? BREAST BIOPSY Right 07/21/2019  ? venus clip, [place] Bx,positive  ? BREAST LUMPECTOMY Right 2012  ? positive/rad  ? BREAST SURGERY    ? CARDIAC SURGERY    ? CARDIAC VALVE REPLACEMENT    ? COLONOSCOPY  2012  ? ELECTROPHYSIOLOGIC STUDY N/A 01/23/2015  ? Procedure: CARDIOVERSION;  Surgeon: Corey Skains, MD;  Location: [facility];  Service: Cardiovascular;  Laterality: N/A;  ? ELECTROPHYSIOLOGIC STUDY N/A 05/27/2016  ? Procedure: CARDIOVERSION;  Surgeon: Corey Skains, MD;  Location: [facility];  Service: Cardiovascular;  Laterality: N/A;  ? ESOPHAGOGASTRODUODENOSCOPY  2012  ? EYE  SURGERY    ? IR CT HEAD LTD  03/10/2020  ? IR PERCUTANEOUS ART THROMBECTOMY/INFUSION INTRACRANIAL INC DIAG ANGIO  03/10/2020  ? IR US GUIDE VASC ACCESS RIGHT  03/10/2020  ? MASTECTOMY Right   ? Mastectomy Right   ? MASTECTOMY WITH AXILLARY LYMPH NODE DISSECTION Right 08/09/2019  ? Procedure: MASTECTOMY WITH AXILLARY LYMPH NODE DISSECTION;  Surgeon: Robert Bellow, MD;  Location: [facility];  Service: General;  Laterality: Right;  ? MITRAL VALVE REPLACEMENT    ? mvp    ? PACEMAKER PLACEMENT N/A   ? RADIOLOGY WITH ANESTHESIA N/A 03/10/2020  ? Procedure: IR WITH ANESTHESIA;  Surgeon: Radiologist, Medication, MD;  Location: [place];  Service: Radiology;  Laterality: N/A;  ? REVERSE SHOULDER ARTHROPLASTY Right 01/07/2018  ? Procedure: REVERSE SHOULDER  ARTHROPLASTY;  Surgeon: Leim Fabry, MD;  Location: [facility];  Service: Orthopedics;  Laterality: Right;  ? VSD REPAIR    ? VSD REPAIR N/A   ? ?Family History  ?Problem Relation Age of Onset  ? Multiple myeloma Father   ? Esophageal cancer Brother   ? Breast cancer Mother   ?     never treated, dx. >50  ? Esophageal cancer Brother   ? Breast cancer Daughter 61  ? Cancer Daughter 21  ?     rare blood cancer  ? ?Social History  ? ?Tobacco Use  ? Smoking status: Never  ? Smokeless tobacco: Never  ?Substance Use Topics  ? Alcohol use: Not Currently  ?  Comment: socially  ? ?Allergies  ?Allergen Reactions  ? Atorvastatin Shortness Of Breath  ? Calcium Shortness Of Breath  ? Fluoxetine   ?  Other reaction(s): Unknown  ? Hydrochlorothiazide   ?  Other reaction(s): Unknown  ? ?Prior to Admission medications   ?Medication Sig Start Date End Date Taking? Authorizing Provider  ?acetaminophen (TYLENOL) 500 MG tablet Take 750 mg by mouth at bedtime as needed for mild pain. For jaw fracture   Yes [provider]  ?ADVAIR DISKUS 100-50 MCG/DOSE AEPB Inhale 1 puff into the lungs 2 (two) times daily. 04/15/20  Yes [provider]  ?amiodarone (PACERONE) 200 MG tablet Take 1 tablet (200 mg total) by mouth 2 (two) times daily. 04/28/21  Yes Nolberto Hanlon, MD  ?B Complex-C (B-COMPLEX WITH VITAMIN C) tablet Take 1 tablet by mouth daily.   Yes [provider]  ?Cholecalciferol 1.25 MG (50000 UT) capsule Take by mouth. 04/28/21  Yes [provider]  ?donepezil (ARICEPT) 5 MG tablet Take 5 mg by mouth at bedtime. 03/21/21  Yes [provider]  ?ELIQUIS 5 MG TABS tablet Take 5 mg by mouth 2 (two) times daily. 03/05/21  Yes [provider]  ?empagliflozin (JARDIANCE) 10 MG TABS tablet Take 1 tablet (10 mg total) by mouth daily before breakfast. 10/17/21  Yes Darylene Price A, FNP  ?ezetimibe (ZETIA) 10 MG tablet Take 10 mg by mouth daily. 02/05/21  Yes [provider]  ?furosemide (LASIX)  40 MG tablet Take 1 tablet (40 mg total) by mouth daily. 04/29/21  Yes Nolberto Hanlon, MD  ?levothyroxine (SYNTHROID) 50 MCG tablet Take 50 mcg by mouth daily before breakfast. 02/05/21  Yes [provider]  ?lisinopril (ZESTRIL) 5 MG tablet TAKE (1) TABLET BY MOUTH EVERY DAY TO PROTECT KIDNEYS 07/21/21  Yes [provider]  ?melatonin 5 MG TABS Take 5 mg by mouth daily. Given at 5pm   Yes [provider]  ?Multiple Vitamin (MULTIVITAMIN) tablet Take 1  tablet by mouth daily.   Yes [provider]  ?pantoprazole (PROTONIX) 40 MG tablet Take 1 tablet (40 mg total) by mouth 2 (two) times daily. 04/28/21  Yes Nolberto Hanlon, MD  ?polyethylene glycol (MIRALAX / GLYCOLAX) 17 g packet Take 17 g by mouth daily as needed for mild constipation. 04/28/21  Yes Nolberto Hanlon, MD  ?potassium chloride SA (KLOR-CON) 20 MEQ tablet Take 1 tablet (20 mEq total) by mouth daily. ?Patient taking differently: Take 40 mEq by mouth daily. 04/28/21  Yes Nolberto Hanlon, MD  ?pravastatin (PRAVACHOL) 40 MG tablet Take 40 mg by mouth at bedtime. 04/02/21  Yes [provider]  ?predniSONE (DELTASONE) 5 MG tablet Take 5 mg by mouth daily with breakfast.   Yes [provider]  ?tamoxifen (NOLVADEX) 20 MG tablet TAKE (1) TABLET BY MOUTH EVERY DAY 06/20/21  Yes Verlon Au, NP  ?ursodiol (ACTIGALL) 300 MG capsule Take 600 mg by mouth 2 (two) times daily. 04/02/21  Yes [provider]  ?venlafaxine XR (EFFEXOR-XR) 37.5 MG 24 hr capsule Take 75 mg by mouth daily. 01/14/20  Yes [provider]  ?VENTOLIN HFA 108 (90 Base) MCG/ACT inhaler Inhale 2 puffs into the lungs every 4 (four) hours as needed. 01/31/21  Yes [provider]  ?Vitamin D, Ergocalciferol, (DRISDOL) 1.25 MG (50000 UNIT) CAPS capsule Take 50,000 Units by mouth every 14 (fourteen) days. On Sundays   Yes [provider]  ?ipratropium-albuterol (DUONEB) 0.5-2.5 (3) MG/3ML SOLN Take 3 mLs by nebulization 3 (three) times  daily. ?Patient not taking: Reported on 12/19/2021    [provider]  ? ? ?Review of Systems  ?Constitutional:  Positive for appetite change (decreased) and fatigue.  ?HENT:  Negative for congesti

## 2021-12-19 ENCOUNTER — Other Ambulatory Visit
Admission: RE | Admit: 2021-12-19 | Discharge: 2021-12-19 | Disposition: A | Discharge status: Home / Self Care | Payer: Medicare Other | Source: Ambulatory Visit | Attending: Family | Admitting: Family

## 2021-12-19 ENCOUNTER — Ambulatory Visit (HOSPITAL_BASED_OUTPATIENT_CLINIC_OR_DEPARTMENT_OTHER): Payer: Medicare Other | Admitting: Family

## 2021-12-19 ENCOUNTER — Encounter: Payer: Self-pay | Admitting: Family

## 2021-12-19 VITALS — BP 122/80 | HR 67 | Resp 20

## 2021-12-19 DIAGNOSIS — I10 Essential (primary) hypertension: Secondary | ICD-10-CM | POA: Diagnosis not present

## 2021-12-19 DIAGNOSIS — I5032 Chronic diastolic (congestive) heart failure: Secondary | ICD-10-CM | POA: Diagnosis not present

## 2021-12-19 DIAGNOSIS — R0602 Shortness of breath: Secondary | ICD-10-CM | POA: Insufficient documentation

## 2021-12-19 DIAGNOSIS — C50919 Malignant neoplasm of unspecified site of unspecified female breast: Secondary | ICD-10-CM

## 2021-12-19 DIAGNOSIS — Z79899 Other long term (current) drug therapy: Secondary | ICD-10-CM | POA: Insufficient documentation

## 2021-12-19 DIAGNOSIS — D631 Anemia in chronic kidney disease: Secondary | ICD-10-CM | POA: Diagnosis not present

## 2021-12-19 DIAGNOSIS — Z7984 Long term (current) use of oral hypoglycemic drugs: Secondary | ICD-10-CM | POA: Insufficient documentation

## 2021-12-19 DIAGNOSIS — I130 Hypertensive heart and chronic kidney disease with heart failure and stage 1 through stage 4 chronic kidney disease, or unspecified chronic kidney disease: Secondary | ICD-10-CM | POA: Diagnosis not present

## 2021-12-19 DIAGNOSIS — I4891 Unspecified atrial fibrillation: Secondary | ICD-10-CM | POA: Diagnosis not present

## 2021-12-19 DIAGNOSIS — I480 Paroxysmal atrial fibrillation: Secondary | ICD-10-CM | POA: Diagnosis not present

## 2021-12-19 DIAGNOSIS — Z7901 Long term (current) use of anticoagulants: Secondary | ICD-10-CM | POA: Diagnosis not present

## 2021-12-19 DIAGNOSIS — C50911 Malignant neoplasm of unspecified site of right female breast: Secondary | ICD-10-CM | POA: Diagnosis not present

## 2021-12-19 DIAGNOSIS — N183 Chronic kidney disease, stage 3 unspecified: Secondary | ICD-10-CM | POA: Diagnosis not present

## 2021-12-19 DIAGNOSIS — K7460 Unspecified cirrhosis of liver: Secondary | ICD-10-CM | POA: Insufficient documentation

## 2021-12-19 DIAGNOSIS — E785 Hyperlipidemia, unspecified: Secondary | ICD-10-CM | POA: Diagnosis not present

## 2021-12-19 DIAGNOSIS — J449 Chronic obstructive pulmonary disease, unspecified: Secondary | ICD-10-CM | POA: Insufficient documentation

## 2021-12-19 DIAGNOSIS — F0390 Unspecified dementia without behavioral disturbance: Secondary | ICD-10-CM | POA: Insufficient documentation

## 2021-12-19 LAB — BASIC METABOLIC PANEL
Anion gap: 17 — ABNORMAL HIGH (ref 5–15)
BUN: 40 mg/dL — ABNORMAL HIGH (ref 8–23)
CO2: 27 mmol/L (ref 22–32)
Calcium: 9.3 mg/dL (ref 8.9–10.3)
Chloride: 91 mmol/L — ABNORMAL LOW (ref 98–111)
Creatinine, Ser: 1.65 mg/dL — ABNORMAL HIGH (ref 0.44–1.00)
GFR, Estimated: 32 mL/min — ABNORMAL LOW (ref >60)
Glucose, Bld: 122 mg/dL — ABNORMAL HIGH (ref 70–99)
Potassium: 3.8 mmol/L (ref 3.5–5.1)
Sodium: 135 mmol/L (ref 135–145)

## 2021-12-19 NOTE — Patient Instructions (Addendum)
Continue weighing daily and call for an overnight weight gain of 3 pounds or more or a weekly weight gain of more than 5 pounds. ? ? ?If you have voicemail, please make sure your mailbox is cleaned out so that we may leave a message and please make sure to listen to any voicemails.  ? ? ?Discuss palliative care and if you decide you would like to pursue this, give me a call.  ? ? ?Discuss using [facility] for primary care.  ? ? ?Use over the counter mucinex for the cough.  ? ? ?For the next 2 days, double fluid pill and potassium pill.  ? ? ?Return as needed ?

## 2021-12-22 DIAGNOSIS — J439 Emphysema, unspecified: Secondary | ICD-10-CM | POA: Diagnosis not present

## 2021-12-22 DIAGNOSIS — J441 Chronic obstructive pulmonary disease with (acute) exacerbation: Secondary | ICD-10-CM | POA: Diagnosis not present

## 2021-12-22 DIAGNOSIS — J90 Pleural effusion, not elsewhere classified: Secondary | ICD-10-CM | POA: Diagnosis not present

## 2021-12-22 DIAGNOSIS — J8410 Pulmonary fibrosis, unspecified: Secondary | ICD-10-CM | POA: Diagnosis not present

## 2022-01-02 DIAGNOSIS — D0462 Carcinoma in situ of skin of left upper limb, including shoulder: Secondary | ICD-10-CM | POA: Diagnosis not present

## 2022-01-02 DIAGNOSIS — C44629 Squamous cell carcinoma of skin of left upper limb, including shoulder: Secondary | ICD-10-CM | POA: Diagnosis not present

## 2022-01-09 ENCOUNTER — Inpatient Hospital Stay
Admission: EM | Admit: 2022-01-09 | Discharge: 2022-01-14 | DRG: 871 | Disposition: A | Discharge status: Home Health | Payer: Medicare Other | Attending: Osteopathic Medicine | Admitting: Osteopathic Medicine

## 2022-01-09 ENCOUNTER — Emergency Department: Payer: Medicare Other

## 2022-01-09 ENCOUNTER — Other Ambulatory Visit: Payer: Self-pay

## 2022-01-09 DIAGNOSIS — N390 Urinary tract infection, site not specified: Secondary | ICD-10-CM | POA: Diagnosis present

## 2022-01-09 DIAGNOSIS — F0390 Unspecified dementia without behavioral disturbance: Secondary | ICD-10-CM | POA: Diagnosis not present

## 2022-01-09 DIAGNOSIS — C50811 Malignant neoplasm of overlapping sites of right female breast: Secondary | ICD-10-CM | POA: Diagnosis not present

## 2022-01-09 DIAGNOSIS — Z7901 Long term (current) use of anticoagulants: Secondary | ICD-10-CM | POA: Diagnosis not present

## 2022-01-09 DIAGNOSIS — R531 Weakness: Secondary | ICD-10-CM | POA: Diagnosis not present

## 2022-01-09 DIAGNOSIS — K7460 Unspecified cirrhosis of liver: Secondary | ICD-10-CM | POA: Diagnosis not present

## 2022-01-09 DIAGNOSIS — A419 Sepsis, unspecified organism: Secondary | ICD-10-CM | POA: Diagnosis not present

## 2022-01-09 DIAGNOSIS — Z66 Do not resuscitate: Secondary | ICD-10-CM | POA: Diagnosis not present

## 2022-01-09 DIAGNOSIS — N179 Acute kidney failure, unspecified: Secondary | ICD-10-CM | POA: Diagnosis not present

## 2022-01-09 DIAGNOSIS — R55 Syncope and collapse: Secondary | ICD-10-CM | POA: Diagnosis not present

## 2022-01-09 DIAGNOSIS — Z87891 Personal history of nicotine dependence: Secondary | ICD-10-CM | POA: Diagnosis not present

## 2022-01-09 DIAGNOSIS — Z90710 Acquired absence of both cervix and uterus: Secondary | ICD-10-CM | POA: Diagnosis not present

## 2022-01-09 DIAGNOSIS — I130 Hypertensive heart and chronic kidney disease with heart failure and stage 1 through stage 4 chronic kidney disease, or unspecified chronic kidney disease: Secondary | ICD-10-CM | POA: Diagnosis present

## 2022-01-09 DIAGNOSIS — Z20822 Contact with and (suspected) exposure to covid-19: Secondary | ICD-10-CM | POA: Diagnosis present

## 2022-01-09 DIAGNOSIS — E785 Hyperlipidemia, unspecified: Secondary | ICD-10-CM | POA: Diagnosis present

## 2022-01-09 DIAGNOSIS — Z803 Family history of malignant neoplasm of breast: Secondary | ICD-10-CM

## 2022-01-09 DIAGNOSIS — Z9889 Other specified postprocedural states: Secondary | ICD-10-CM

## 2022-01-09 DIAGNOSIS — Z8673 Personal history of transient ischemic attack (TIA), and cerebral infarction without residual deficits: Secondary | ICD-10-CM

## 2022-01-09 DIAGNOSIS — Z95810 Presence of automatic (implantable) cardiac defibrillator: Secondary | ICD-10-CM | POA: Diagnosis present

## 2022-01-09 DIAGNOSIS — J449 Chronic obstructive pulmonary disease, unspecified: Secondary | ICD-10-CM | POA: Diagnosis not present

## 2022-01-09 DIAGNOSIS — K219 Gastro-esophageal reflux disease without esophagitis: Secondary | ICD-10-CM | POA: Diagnosis present

## 2022-01-09 DIAGNOSIS — F0394 Unspecified dementia, unspecified severity, with anxiety: Secondary | ICD-10-CM | POA: Diagnosis not present

## 2022-01-09 DIAGNOSIS — N189 Chronic kidney disease, unspecified: Secondary | ICD-10-CM | POA: Diagnosis present

## 2022-01-09 DIAGNOSIS — G9341 Metabolic encephalopathy: Secondary | ICD-10-CM | POA: Diagnosis present

## 2022-01-09 DIAGNOSIS — I10 Essential (primary) hypertension: Secondary | ICD-10-CM | POA: Diagnosis present

## 2022-01-09 DIAGNOSIS — R197 Diarrhea, unspecified: Secondary | ICD-10-CM | POA: Diagnosis present

## 2022-01-09 DIAGNOSIS — I5032 Chronic diastolic (congestive) heart failure: Secondary | ICD-10-CM | POA: Diagnosis present

## 2022-01-09 DIAGNOSIS — E039 Hypothyroidism, unspecified: Secondary | ICD-10-CM | POA: Diagnosis not present

## 2022-01-09 DIAGNOSIS — R4182 Altered mental status, unspecified: Secondary | ICD-10-CM | POA: Diagnosis not present

## 2022-01-09 DIAGNOSIS — I959 Hypotension, unspecified: Secondary | ICD-10-CM | POA: Diagnosis not present

## 2022-01-09 DIAGNOSIS — I4892 Unspecified atrial flutter: Secondary | ICD-10-CM | POA: Diagnosis not present

## 2022-01-09 DIAGNOSIS — I469 Cardiac arrest, cause unspecified: Secondary | ICD-10-CM | POA: Diagnosis present

## 2022-01-09 DIAGNOSIS — D649 Anemia, unspecified: Secondary | ICD-10-CM | POA: Diagnosis present

## 2022-01-09 DIAGNOSIS — E860 Dehydration: Principal | ICD-10-CM

## 2022-01-09 DIAGNOSIS — R6520 Severe sepsis without septic shock: Secondary | ICD-10-CM | POA: Diagnosis present

## 2022-01-09 DIAGNOSIS — Z923 Personal history of irradiation: Secondary | ICD-10-CM

## 2022-01-09 DIAGNOSIS — S51812A Laceration without foreign body of left forearm, initial encounter: Secondary | ICD-10-CM | POA: Diagnosis present

## 2022-01-09 DIAGNOSIS — F419 Anxiety disorder, unspecified: Secondary | ICD-10-CM | POA: Diagnosis present

## 2022-01-09 DIAGNOSIS — Z79890 Hormone replacement therapy (postmenopausal): Secondary | ICD-10-CM

## 2022-01-09 DIAGNOSIS — N1832 Chronic kidney disease, stage 3b: Secondary | ICD-10-CM | POA: Diagnosis present

## 2022-01-09 DIAGNOSIS — I4891 Unspecified atrial fibrillation: Secondary | ICD-10-CM | POA: Diagnosis present

## 2022-01-09 DIAGNOSIS — Z853 Personal history of malignant neoplasm of breast: Secondary | ICD-10-CM

## 2022-01-09 DIAGNOSIS — Z800 Family history of malignant neoplasm of digestive organs: Secondary | ICD-10-CM

## 2022-01-09 DIAGNOSIS — Z807 Family history of other malignant neoplasms of lymphoid, hematopoietic and related tissues: Secondary | ICD-10-CM

## 2022-01-09 DIAGNOSIS — Z170 Estrogen receptor positive status [ER+]: Secondary | ICD-10-CM

## 2022-01-09 DIAGNOSIS — Z79899 Other long term (current) drug therapy: Secondary | ICD-10-CM

## 2022-01-09 DIAGNOSIS — R296 Repeated falls: Secondary | ICD-10-CM

## 2022-01-09 LAB — URINALYSIS, ROUTINE W REFLEX MICROSCOPIC
Bilirubin Urine: NEGATIVE
Glucose, UA: >=500 mg/dL — AB
Hgb urine dipstick: NEGATIVE
Ketones, ur: NEGATIVE mg/dL
Nitrite: NEGATIVE
Protein, ur: NEGATIVE mg/dL
Specific Gravity, Urine: 1.006 (ref 1.005–1.030)
pH: 5 (ref 5.0–8.0)

## 2022-01-09 LAB — CBC
HCT: 39.2 % (ref 36.0–46.0)
Hemoglobin: 12.1 g/dL (ref 12.0–15.0)
MCH: 29.4 pg (ref 26.0–34.0)
MCHC: 30.9 g/dL (ref 30.0–36.0)
MCV: 95.1 fL (ref 80.0–100.0)
Platelets: 239 10*3/uL (ref 150–400)
RBC: 4.12 MIL/uL (ref 3.87–5.11)
RDW: 14.3 % (ref 11.5–15.5)
WBC: 18.4 10*3/uL — ABNORMAL HIGH (ref 4.0–10.5)
nRBC: 0 % (ref 0.0–0.2)

## 2022-01-09 LAB — RESP PANEL BY RT-PCR (FLU A&B, COVID) ARPGX2
Influenza A by PCR: NEGATIVE
Influenza B by PCR: NEGATIVE
SARS Coronavirus 2 by RT PCR: NEGATIVE

## 2022-01-09 LAB — TROPONIN I (HIGH SENSITIVITY)
Troponin I (High Sensitivity): 52 ng/L — ABNORMAL HIGH (ref <18)
Troponin I (High Sensitivity): 39 ng/L — ABNORMAL HIGH (ref <18)

## 2022-01-09 LAB — CBG MONITORING, ED: Glucose-Capillary: 177 mg/dL — ABNORMAL HIGH (ref 70–99)

## 2022-01-09 LAB — COMPREHENSIVE METABOLIC PANEL
ALT: 16 U/L (ref 0–44)
AST: 32 U/L (ref 15–41)
Albumin: 3.5 g/dL (ref 3.5–5.0)
Alkaline Phosphatase: 83 U/L (ref 38–126)
Anion gap: 14 (ref 5–15)
BUN: 42 mg/dL — ABNORMAL HIGH (ref 8–23)
CO2: 27 mmol/L (ref 22–32)
Calcium: 9.8 mg/dL (ref 8.9–10.3)
Chloride: 100 mmol/L (ref 98–111)
Creatinine, Ser: 1.87 mg/dL — ABNORMAL HIGH (ref 0.44–1.00)
GFR, Estimated: 28 mL/min — ABNORMAL LOW (ref >60)
Glucose, Bld: 169 mg/dL — ABNORMAL HIGH (ref 70–99)
Potassium: 4 mmol/L (ref 3.5–5.1)
Sodium: 141 mmol/L (ref 135–145)
Total Bilirubin: 0.4 mg/dL (ref 0.3–1.2)
Total Protein: 7.5 g/dL (ref 6.5–8.1)

## 2022-01-09 LAB — BRAIN NATRIURETIC PEPTIDE: B Natriuretic Peptide: 150.5 pg/mL — ABNORMAL HIGH (ref 0.0–100.0)

## 2022-01-09 LAB — LACTIC ACID, PLASMA
Lactic Acid, Venous: 1.9 mmol/L (ref 0.5–1.9)
Lactic Acid, Venous: 1.9 mmol/L (ref 0.5–1.9)

## 2022-01-09 LAB — TSH: TSH: 0.771 u[IU]/mL (ref 0.350–4.500)

## 2022-01-09 LAB — PROCALCITONIN: Procalcitonin: 0.1 ng/mL

## 2022-01-09 MED ORDER — SODIUM CHLORIDE 0.9 % IV SOLN
2.0000 g | Freq: Once | INTRAVENOUS | Status: AC
  Administered 2022-01-09: 2 g via INTRAVENOUS
  Filled 2022-01-09: qty 12.5

## 2022-01-09 MED ORDER — PRAVASTATIN SODIUM 20 MG PO TABS
40.0000 mg | ORAL_TABLET | Freq: Every day | ORAL | Status: DC
  Administered 2022-01-09 – 2022-01-13 (×5): 40 mg via ORAL
  Filled 2022-01-09 (×2): qty 2
  Filled 2022-01-09: qty 1
  Filled 2022-01-09: qty 2
  Filled 2022-01-09: qty 1

## 2022-01-09 MED ORDER — HEPARIN SODIUM (PORCINE) 5000 UNIT/ML IJ SOLN: 5000.0000 [IU] | Freq: Three times a day (TID) | INTRAMUSCULAR | Status: DC

## 2022-01-09 MED ORDER — LORAZEPAM 2 MG/ML IJ SOLN: 1.0000 mg | Freq: Four times a day (QID) | INTRAMUSCULAR | Status: DC | PRN

## 2022-01-09 MED ORDER — VITAMIN D (ERGOCALCIFEROL) 1.25 MG (50000 UNIT) PO CAPS
50000.0000 [IU] | ORAL_CAPSULE | ORAL | Status: DC
  Administered 2022-01-11: 50000 [IU] via ORAL
  Filled 2022-01-09: qty 1

## 2022-01-09 MED ORDER — VANCOMYCIN VARIABLE DOSE PER UNSTABLE RENAL FUNCTION (PHARMACIST DOSING): Status: DC

## 2022-01-09 MED ORDER — ADULT MULTIVITAMIN W/MINERALS CH
1.0000 | ORAL_TABLET | Freq: Every day | ORAL | Status: DC
  Administered 2022-01-10 – 2022-01-14 (×5): 1 via ORAL
  Filled 2022-01-09 (×5): qty 1

## 2022-01-09 MED ORDER — EZETIMIBE 10 MG PO TABS
10.0000 mg | ORAL_TABLET | Freq: Every day | ORAL | Status: DC
Start: 2022-01-10 — End: 2022-01-14
  Administered 2022-01-10 – 2022-01-14 (×5): 10 mg via ORAL
  Filled 2022-01-09 (×5): qty 1

## 2022-01-09 MED ORDER — MOMETASONE FURO-FORMOTEROL FUM 100-5 MCG/ACT IN AERO
2.0000 | INHALATION_SPRAY | Freq: Two times a day (BID) | RESPIRATORY_TRACT | Status: DC
  Administered 2022-01-10 – 2022-01-14 (×9): 2 via RESPIRATORY_TRACT
  Filled 2022-01-09: qty 8.8

## 2022-01-09 MED ORDER — ALBUTEROL SULFATE (2.5 MG/3ML) 0.083% IN NEBU: 3.0000 mL | INHALATION_SOLUTION | RESPIRATORY_TRACT | Status: DC | PRN

## 2022-01-09 MED ORDER — VENLAFAXINE HCL ER 75 MG PO CP24
75.0000 mg | ORAL_CAPSULE | Freq: Every day | ORAL | Status: DC
  Administered 2022-01-10 – 2022-01-14 (×5): 75 mg via ORAL
  Filled 2022-01-09 (×6): qty 1

## 2022-01-09 MED ORDER — ONDANSETRON HCL 4 MG PO TABS: 4.0000 mg | ORAL_TABLET | Freq: Four times a day (QID) | ORAL | Status: DC | PRN

## 2022-01-09 MED ORDER — ONDANSETRON HCL 4 MG/2ML IJ SOLN: 4.0000 mg | Freq: Four times a day (QID) | INTRAMUSCULAR | Status: DC | PRN

## 2022-01-09 MED ORDER — ACETAMINOPHEN 650 MG RE SUPP: 650.0000 mg | Freq: Four times a day (QID) | RECTAL | Status: DC | PRN

## 2022-01-09 MED ORDER — URSODIOL 300 MG PO CAPS
600.0000 mg | ORAL_CAPSULE | Freq: Two times a day (BID) | ORAL | Status: DC
  Administered 2022-01-10 – 2022-01-14 (×10): 600 mg via ORAL
  Filled 2022-01-09 (×12): qty 2

## 2022-01-09 MED ORDER — DONEPEZIL HCL 5 MG PO TABS
5.0000 mg | ORAL_TABLET | Freq: Every day | ORAL | Status: DC
  Administered 2022-01-09 – 2022-01-13 (×5): 5 mg via ORAL
  Filled 2022-01-09 (×6): qty 1

## 2022-01-09 MED ORDER — SODIUM CHLORIDE 0.9 % IV BOLUS (SEPSIS)
500.0000 mL | Freq: Once | INTRAVENOUS | Status: AC
  Administered 2022-01-09: 500 mL via INTRAVENOUS

## 2022-01-09 MED ORDER — PREDNISONE 10 MG PO TABS
5.0000 mg | ORAL_TABLET | Freq: Every day | ORAL | Status: DC
  Administered 2022-01-10 – 2022-01-14 (×5): 5 mg via ORAL
  Filled 2022-01-09 (×5): qty 1

## 2022-01-09 MED ORDER — SODIUM CHLORIDE 0.9 % IV BOLUS
1000.0000 mL | Freq: Once | INTRAVENOUS | Status: AC
  Administered 2022-01-09: 1000 mL via INTRAVENOUS

## 2022-01-09 MED ORDER — LEVOTHYROXINE SODIUM 50 MCG PO TABS
50.0000 ug | ORAL_TABLET | Freq: Every day | ORAL | Status: DC
  Administered 2022-01-10 – 2022-01-14 (×5): 50 ug via ORAL
  Filled 2022-01-09 (×5): qty 1

## 2022-01-09 MED ORDER — HYDRALAZINE HCL 10 MG PO TABS
10.0000 mg | ORAL_TABLET | Freq: Four times a day (QID) | ORAL | Status: AC | PRN
  Administered 2022-01-09 – 2022-01-11 (×2): 10 mg via ORAL
  Filled 2022-01-09 (×2): qty 1

## 2022-01-09 MED ORDER — PANTOPRAZOLE SODIUM 40 MG PO TBEC
40.0000 mg | DELAYED_RELEASE_TABLET | Freq: Two times a day (BID) | ORAL | Status: DC
Start: 2022-01-09 — End: 2022-01-14
  Administered 2022-01-09 – 2022-01-14 (×10): 40 mg via ORAL
  Filled 2022-01-09 (×10): qty 1

## 2022-01-09 MED ORDER — VANCOMYCIN HCL IN DEXTROSE 1-5 GM/200ML-% IV SOLN
1000.0000 mg | Freq: Once | INTRAVENOUS | Status: AC
  Filled 2022-01-09: qty 200

## 2022-01-09 MED ORDER — ONDANSETRON HCL 4 MG/2ML IJ SOLN
4.0000 mg | Freq: Once | INTRAMUSCULAR | Status: AC
  Administered 2022-01-09: 4 mg via INTRAVENOUS
  Filled 2022-01-09: qty 2

## 2022-01-09 MED ORDER — B COMPLEX-C PO TABS
1.0000 | ORAL_TABLET | Freq: Every day | ORAL | Status: DC
  Administered 2022-01-10 – 2022-01-14 (×5): 1 via ORAL
  Filled 2022-01-09 (×5): qty 1

## 2022-01-09 MED ORDER — ACETAMINOPHEN 325 MG PO TABS: 650.0000 mg | ORAL_TABLET | Freq: Four times a day (QID) | ORAL | Status: DC | PRN

## 2022-01-09 MED ORDER — MELATONIN 5 MG PO TABS
5.0000 mg | ORAL_TABLET | Freq: Every day | ORAL | Status: DC
  Administered 2022-01-09 – 2022-01-13 (×5): 5 mg via ORAL
  Filled 2022-01-09 (×5): qty 1

## 2022-01-09 MED ORDER — METOPROLOL TARTRATE 5 MG/5ML IV SOLN: 5.0000 mg | INTRAVENOUS | Status: DC | PRN

## 2022-01-09 MED ORDER — APIXABAN 5 MG PO TABS
5.0000 mg | ORAL_TABLET | Freq: Two times a day (BID) | ORAL | Status: DC
  Administered 2022-01-09 – 2022-01-14 (×10): 5 mg via ORAL
  Filled 2022-01-09 (×10): qty 1

## 2022-01-09 MED ORDER — VANCOMYCIN HCL IN DEXTROSE 1-5 GM/200ML-% IV SOLN
1000.0000 mg | Freq: Once | INTRAVENOUS | Status: AC
  Administered 2022-01-10: 1000 mg via INTRAVENOUS

## 2022-01-09 NOTE — Assessment & Plan Note (Addendum)
See A-fib ?

## 2022-01-09 NOTE — Assessment & Plan Note (Addendum)
Continue levothyroxine.  TSH within normal limits ?

## 2022-01-09 NOTE — Assessment & Plan Note (Addendum)
Present on admission as evidenced by leukocytosis, hypotension and acute encephalopathy consistent with organ dysfunction in the setting of likely UTI. ?Treated per sepsis protocol in ED with fluid boluses and IV antibiotics. Started on empiric vancomycin and cefepime- Stopped vancomycin-- Transition Cefepime >> Rocephin for empiric UTI overage. ABX were able to be deescalated to cefazolin, pt completed 5 days Rx ? ?

## 2022-01-09 NOTE — ED Provider Notes (Signed)
Procedures ? ?  ? ?----------------------------------------- ?7:44 PM on 01/09/2022 ?----------------------------------------- ? ?Chest x-ray viewed and interpreted by me, appears unremarkable.  Radiology report reviewed. ? ?CT head negative for acute infarct intracranial hemorrhage or other acute findings. ? ?Lab panel so far unremarkable.  Baseline CKD.  Still awaiting urinalysis. ? ?Case discussed with hospitalist for further management. ? ?  ?Carrie Mew, MD ?01/09/22 1945 ? ?

## 2022-01-09 NOTE — Hospital Course (Addendum)
Ms. Bill Mcvey is a 76 year old female with history of dementia, hyperlipidemia, hypertension, COPD, hypothyroid, atrial flutter on anticoagulation, history of malignant neoplasm of the right breast, ER positive, history of CVA, CKD stage IIIb, who presented to the ED on 01/09/2022 for evaluation after having a syncopal episode. Initial BP was 86/43 which improved with bolused IV fluids. Otherwise vitals were stable in the ED. Initial labs notable for Cr 1.87, WBC 18.4k.  Hs-troponin 52>>39.  UA suggests possible infection with many bacteria, only trace leukocytes, 6-10 WBC's, glucosuria. Imaging - CXR negative.   ?CT of the head without contrast: "expected evolution of prior infarct within the left parietal and mildly left temporal region, now chronic encephalomalacia.  Unchanged smaller left occipital and right cerebellar remote infarcts.  No acute intracranial hemorrhage.  No acute intracranial process." Started on empiric broad spectrum antibiotics for sepsis due to probable UTI.  Admitted to the hospital for further evaluation and management. Antibiotics were able to be deescalated and treatment completed inpatient. Pt continued to improve to baseline, was initially going to be d/c to SNF and this was in process, but family later decided to do home health instead, this was arranged and everythign was ready for patient discharge 01/14/22  ?

## 2022-01-09 NOTE — Assessment & Plan Note (Addendum)
Present on admission with creatinine of 1.87. ?Baseline CKD 3B, baseline Cr 1.42-1.65. ?Likely prerenal azotemia in the setting of hypotension and sepsis. ?Renal function improved with IV fluids. ? ?

## 2022-01-09 NOTE — Assessment & Plan Note (Addendum)
History of V. tach cardiac arrest in 04/10/2021, patient received CPR and ACLS protocol, was intubated and transferred to ICU care.  V. tach was presumed secondary to hypokalemia as her ejection fraction was normal.  Patient also received defibrillation. She was started on amiodarone gtt. and transition to p.o. amiodarone prior to discharge ?- Per med reconciliation, patient is no longer on amiodarone ?

## 2022-01-09 NOTE — Assessment & Plan Note (Signed)
-   Continue follow-up outpatient ?

## 2022-01-09 NOTE — Assessment & Plan Note (Addendum)
Home lisinopril held on admission due to AKI and hypotension, resume when able. ?Continue as needed hydralazine for now.  Monitor BP.  Titrate antihypertensives ? ?

## 2022-01-09 NOTE — ED Triage Notes (Addendum)
Patient to ER via [facility] from home after a wtinessed syncopal episode. Reports being outside ready to go strawberry picking when suddenly feeling unwell. Weak on arrival. Arousable/ alert but lethargic. Oriented x2. Witnesses told EMS that patient was pale in appearance and vomited 1 time. Patient not oriented to situation. ? ?EMS VSS. Patient on 2L via [place] at baseline.  ?

## 2022-01-09 NOTE — Assessment & Plan Note (Addendum)
Continue home Effexor ?Ativan as needed for anxiety or seizure activity ?

## 2022-01-09 NOTE — Consult Note (Signed)
Pharmacy Antibiotic Note ? ?Adriana Martin is a 76 y.o. female with CKD Stage 3b admitted on 01/09/2022 with sepsis.  Pharmacy has been consulted for Cefepime and Vancomcyin dosing. ? ?Scr on admission of 1.87. Baseline appears to be around 1.42-1.65.  ? ?Plan: ?Give Vancomycin 2gm x1 followed by dosing per random levels based given AKI ?Give Cefepime 2gm IV every 24 hours ? ?Height: '5\' 2"'$  (157.5 cm) ?Weight: 76.9 kg (169 lb 8.5 oz) ?IBW/kg (Calculated) : 50.1 ? ?Temp (24hrs), Avg:98.3 ?F (36.8 ?C), Min:98.3 ?F (36.8 ?C), Max:98.3 ?F (36.8 ?C) ? ?Recent Labs  ?Lab 01/09/22 ?1419 01/09/22 ?1421  ?WBC 18.4*  --   ?CREATININE  --  1.87*  ?  ?Estimated Creatinine Clearance: 24.6 mL/min (A) (by C-G formula based on SCr of 1.87 mg/dL (H)).   ? ?Allergies  ?Allergen Reactions  ? Atorvastatin Shortness Of Breath  ?  Tolerates pravastatin  ? Calcium Shortness Of Breath  ? Fluoxetine   ?  Other reaction(s): Unknown  ? Hydrochlorothiazide   ?  Other reaction(s): Unknown  ? ? ?Antimicrobials this admission: ?Vancomycin  >>  ?Cefepime  >>  ? ?Dose adjustments this admission: ? ? ?Microbiology results: ?4/21 BCx: sent ?4/21 MRSA PCR: ordered ? ? ?Thank you for allowing pharmacy to be a part of this pleasant patient?s care. ? ?Darrick Penna, PharmD, MS PGPM ?Clinical Pharmacist ?01/09/2022 ?9:35 PM ? ? ?

## 2022-01-09 NOTE — Assessment & Plan Note (Addendum)
Continue pravastatin 

## 2022-01-09 NOTE — Assessment & Plan Note (Addendum)
Not acutely exacerbated, no wheezing on exam ?Continue formulary equivalents for home maintenance inhaler/s.  As needed albuterol nebs ?

## 2022-01-09 NOTE — ED Notes (Signed)
Pt transported to CT ?

## 2022-01-09 NOTE — ED Notes (Addendum)
Pt [facility], for loss of consciousness. Per family, pt was about to go strawberry patch and suddenly pt became unconscious. Pt arousable to voice. Pt oriented to self upon assessment. ?Pt has hx of dementia, CVA.   ?

## 2022-01-09 NOTE — Assessment & Plan Note (Addendum)
No apparent behavioral disturbances. ?-- Delirium precautions ?--Continue home Aricept ?

## 2022-01-09 NOTE — H&P (Addendum)
?History and Physical  ? ?Wealthy Danielski IPJ:[id number] DOB: 01-Dec-1945 DOA: 01/09/2022 ? ?PCP: Sallee Lange, NP  ?Outpatient Specialists: Dr. Stevphen Meuse clinic cardiology ?Patient coming from: Home ? ?I have personally briefly reviewed patient's old medical records in [facility] [place]. ? ?Chief Concern: Weakness and syncope ? ?HPI: Ms. Olivene Cookston is a 76 year old female with history of dementia, hyperlipidemia, hypertension, COPD, hypothyroid, atrial flutter on anticoagulation, history of CVA, CKD stage IIIb, who presents emergency department for chief concerns of syncopal episode. ? ?Initial vitals in the emergency department showed temperature of 98.3, respiration rate of 17, heart rate of 62, initial blood pressure was 86/43 which improved to 156/73, SPO2 of 100% on 2 L nasal cannula. ? ?Serum sodium is 143, potassium 4.0, chloride of 100, bicarb of 27, BUN of 42, serum creatinine of 1.87, nonfasting blood glucose 169, GFR 28, WBC 18.4, hemoglobin 12.1, platelets of 239. ? ?COVID/influenza A/influenza B PCR were negative. ? ?High sensitive troponin was 52 and decreased to 39. ? ?Portable chest x-ray: Was read as no active disease. ? ?CT of the head without contrast: Was read as expected evolution of prior infarct within the left parietal and mildly left temporal region, now chronic encephalomalacia.  Unchanged smaller left occipital and right cerebellar remote infarcts.  No acute intracranial hemorrhage.  No acute intracranial process. ? ?ED treatment: Sodium chloride 1 L bolus at 1432 and additional sodium chloride 1 L bolus at 1727. ? ?At bedside patient is able to tell me her name. She was not able to tell me her current location, the current calendar year, or the current president of the [place].  She was not able to tell me her age. ? ?She states she is not hurting anywhere.  She states she does not know what happened today. ? ?She was about to go to strawberry patch with  [name]. In the car, on the way to the strawberry patch, she vomited in the car. At the strawberry patch, she vomited. Catha Gosselin took her home.  ? ?She collapsed outside and did not make it int he house.  ? ?Social history: She lives with sister, [name]. She is a former tobacco user, quitting more than 10 years.  ? ?ROS: Unable to complete due to moderate to advanced dementia. ? ?ED Course: Discussed with emergency medicine provider, patient requiring hospitalization for chief concerns of syncope concerning for dehydration. ? ?Assessment/Plan ? ?Principal Problem: ?  Severe sepsis with acute organ dysfunction ([place]) ?Active Problems: ?  Atrial flutter ([place]) ?  Anemia ?  Anxiety ?  A-fib ([place]) ?  Carcinoma of overlapping sites of right breast in female, estrogen receptor positive ([place]) ?  Hyperlipidemia ?  Acute kidney injury superimposed on CKD ([place]) ?  HTN (hypertension) ?  COPD (chronic obstructive pulmonary disease) ([place]) ?  Hypothyroid ?  Dementia ([place]) ?  Cardiac arrest [facility]) ?  Weakness ?  GERD (gastroesophageal reflux disease) ?  ICD (implantable cardioverter-defibrillator), biventricular, in situ ?  Recurrent falls ?  S/P mitral valve repair ?  ?Assessment and Plan: ? ?* Severe sepsis with acute organ dysfunction ([place]) ?- Patient met SIRS criteria with leukocytosis, hypotension, and encephalopathy ?- Etiology/source work-up in progress ?- Check UA, procalcitonin, blood cultures x2, lactic acid x2 ?- Broad-spectrum antibiotic with cefepime and vancomycin per pharmacy ?- Status post 2 L sodium chloride per EDP ?- Sodium chloride 500 mL bolus ?- CBC and BMP in the a.m. ?- Admit to telemetry cardiac, observation ? ?Cardiac  arrest [facility]) ?- History of V. tach cardiac arrest in 04/10/2021, patient received CPR and ACLS protocol, was intubated and transferred to ICU care.  V. tach was presumed secondary to hypokalemia as her ejection fraction was normal.  Patient also received defibrillation. She was started on  amiodarone gtt. and transition to p.o. amiodarone prior to discharge ?- Per med reconciliation, patient is no longer on amiodarone ? ?Dementia ([place]) ?- Resumed home donepezil 5 mg nightly ? ?Hypothyroid ?- Resumed levothyroxine 50 mcg daily before breakfast ? ?COPD (chronic obstructive pulmonary disease) ([place]) ?- Not in exacerbation at this time ?- Resumed home long-acting inhaler alternative, albuterol inhalation every 4 hours as needed for shortness of breath and wheezing ? ?HTN (hypertension) ?- Patient takes lisinopril 5 mg daily ?- Holding at this time due to acute kidney injury and initial presentation of hypotension ?- Hydralazine 10 mg p.o. every 6 hours as needed for SBP > 165, 4 days ordered ? ?Acute kidney injury superimposed on CKD ([place]) ?- Baseline CKD 3B, serum creatinine baseline is 1.42-1.65/eGFR 32-38 ?- Suspect secondary to sepsis versus dehydration as it was particularly hot on day of presentation ?- Status post 2 L of sodium chloride per EDP ?- Additional sodium chloride 500 mL bolus ordered to maintain MAP greater than 65 ?-BMP in a.m. ? ?Hyperlipidemia ?- Pravastatin 40 mg nightly resumed ? ?Carcinoma of overlapping sites of right breast in female, estrogen receptor positive ([place]) ?- Continue follow-up outpatient ? ?Anxiety ?- Venlafaxine 75 mg daily resumed ?- Ativan 1 mg IV every 6 hours as needed for anxiety and seizure, 2 doses ordered ? ?Atrial flutter ([place]) ?- Eliquis 5 mg p.o. twice daily resumed ?- Currently not on home beta-blockade ?- Metoprolol tartrate 5 mg IV, every 2 hours as needed for heart rate greater than 120, 3 doses ordered ? ?Chart reviewed.  ? ?Complete echo on 04/05/2021: Left ventricular ejection fraction by estimation was 60 to 65%.  Left ventricle has no regional wall motion abnormalities.  Left ventricular diastolic parameters are indeterminate.  Right atrial size was severely dilated.  Left atrial size was severely dilated. ? ?DVT prophylaxis: Eliquis ?Code Status:  DNR ?Diet: Heart healthy/carb modified ?Family Communication: Spoke with daughter, Ms. Cici Rodriges. Was not able to reach Ms. Wyline Copas ?Disposition Plan: Pending clinical course, blood cultures x2, improvement of WBC etc. ?Consults called: None at this time ?Admission status: Telemetry cardiac, observation ? ?Past Medical History:  ?Diagnosis Date  ? Alcohol use   ? Anemia   ? Anxiety   ? Atrial fibrillation ([place])   ? B12 deficiency   ? Breast cancer ([place])   ? RT Lumpectomy c radiation 2012  ? Breast cancer ([place])   ? CHF (congestive heart failure) ([place])   ? Chronic diastolic CHF (congestive heart failure) ([place])   ? Chronic kidney disease   ? Cirrhosis ([place])   ? CKD (chronic kidney disease), stage III ([place])   ? COPD (chronic obstructive pulmonary disease) ([place])   ? Dementia ([place])   ? Depression   ? Family history of breast cancer   ? Family history of esophageal cancer   ? Family history of multiple myeloma   ? GERD (gastroesophageal reflux disease)   ? Headache   ? Heart murmur   ? HLD (hyperlipidemia)   ? HTN (hypertension)   ? Hyperlipemia   ? Hyperlipidemia   ? Hypertension   ? Hypothyroid   ? Hypothyroidism   ? Iron (Fe) deficiency anemia   ? Monoclonal  gammopathy   ? Pacemaker   ? Personal history of radiation therapy   ? Presence of permanent cardiac pacemaker   ? Shortness of breath dyspnea   ? Thrombocytopenia ([place])   ? Vitamin D deficiency   ? ?Past Surgical History:  ?Procedure Laterality Date  ? ABDOMINAL HYSTERECTOMY    ? Partial  ? ABLATION    ? APPENDECTOMY    ? BICEPT TENODESIS Right 01/07/2018  ? Procedure: BICEPS TENODESIS;  Surgeon: Leim Fabry, MD;  Location: [facility] ORS;  Service: Orthopedics;  Laterality: Right;  ? BREAST BIOPSY Right 2012  ? BREAST BIOPSY Right 07/21/2019  ? venus clip, [place] Bx,positive  ? BREAST LUMPECTOMY Right 2012  ? positive/rad  ? BREAST SURGERY    ? CARDIAC SURGERY    ? CARDIAC VALVE REPLACEMENT    ? COLONOSCOPY  2012  ? ELECTROPHYSIOLOGIC STUDY N/A 01/23/2015  ?  Procedure: CARDIOVERSION;  Surgeon: Corey Skains, MD;  Location: [facility] ORS;  Service: Cardiovascular;  Laterality: N/A;  ? ELECTROPHYSIOLOGIC STUDY N/A 05/27/2016  ? Procedure: CARDIOVERSION;  Surgeon: Corey Skains

## 2022-01-09 NOTE — Progress Notes (Signed)
Patient came up onto floor soiled from stool. Changed patient and performed skin assessment with another nurse. Asked patient questions for assessment and patient replied that she did not know anything. Did confirm her name and date of birth. The ED nurse did state that she was only oriented to self. Will try to call family to complete admission assessment. Patient has blanchable redness to right ankle, right back and bilateral buttocks. Mepilex placed on sacrum. Has a sutured laceration to left forearm and an abrasion to that same arm. Bilateral legs appear mottled but pulses are +2. Patient had a fall pta-fall, r-extremity alert, DNR and allergy bands were placed. Bed alarm placed and patient educated to not get up from bed without calling for help. Patient denied pain. V/S stable.   ?

## 2022-01-09 NOTE — ED Notes (Signed)
Pt cleaned into new brief and chucks, female purewick applied.  ?

## 2022-01-09 NOTE — ED Provider Notes (Signed)
? ?[facility] ?Provider Note ? ? ? Event Date/Time  ? First MD Initiated Contact with Patient 01/09/22 1411   ?  (approximate) ? ?History  ? ?Chief Complaint: Loss of Consciousness ? ?HPI ? ?Adriana Martin is a 76 y.o. female with a past medical history of COPD, CKD, dementia, hypertension, hyperlipidemia, presents to the emergency department for weakness nausea vomiting.  According to the daughter Adriana Martin (with whom I spoke on the phone) for the past 1 week the patient has been acting more fatigued and weak than normal.  Today they were going to ride in the car to pick up strawberries however when they began driving patient became very weak and vomited multiple times.  They got the patient back home and she was very weak and had trouble walking due to weakness they also noted that her extremities looked darker than normal and the patient's responses were slowed.  Daughter states the patient has had 1 stroke previously 1 year ago with some speech deficits from that stroke but no other weakness or numbness.  Here the patient is sluggish to respond but does respond and does follow simple commands.  Patient does not recall the events surrounding what happened at home however daughter states patient has dementia and might not recall that. ? ?Physical Exam  ? ?Triage Vital Signs: ?ED Triage Vitals  ?Enc Vitals Group  ?   BP 01/09/22 1407 (!) 86/43  ?   Pulse Rate 01/09/22 1407 62  ?   Resp 01/09/22 1407 17  ?   Temp 01/09/22 1407 98.3 ?F (36.8 ?C)  ?   Temp Source 01/09/22 1407 Oral  ?   SpO2 01/09/22 1407 100 %  ?   Weight --   ?   Height 01/09/22 1408 '5\' 2"'$  (1.575 m)  ?   Head Circumference --   ?   Peak Flow --   ?   Pain Score 01/09/22 1408 0  ?   Pain Loc --   ?   Pain Edu? --   ?   Excl. in [place]? --   ? ? ?Most recent vital signs: ?Vitals:  ? 01/09/22 1407  ?BP: (!) 86/43  ?Pulse: 62  ?Resp: 17  ?Temp: 98.3 ?F (36.8 ?C)  ?SpO2: 100%  ? ? ?General: Awake, answers questions and follow  commands but somewhat sluggish/slow to respond at times. ?CV:  Good peripheral perfusion.  Regular rate and rhythm  ?Resp:  Normal effort.  Equal breath sounds bilaterally.  ?Abd:  No distention.  Soft, nontender.  No rebound or guarding. ? ? ? ?ED Results / Procedures / Treatments  ? ?EKG ? ?EKG viewed and interpreted by myself shows what appears to be atrial flutter at 66 bpm with a widened QRS, right axis deviation, largely normal intervals with nonspecific ST changes. ? ?RADIOLOGY ? ?Chest x-ray images shows pacemaker/defibrillator no acute lung abnormality seen on my evaluation.  Radiology read chest x-ray is negative ?CT scan shows expected evolution of prior infarct with remote infarct ? ? ?MEDICATIONS ORDERED IN ED: ?Medications  ?sodium chloride 0.9 % bolus 1,000 mL (has no administration in time range)  ?sodium chloride 0.9 % bolus 1,000 mL (has no administration in time range)  ?ondansetron (ZOFRAN) injection 4 mg (has no administration in time range)  ? ? ? ?IMPRESSION / MDM / ASSESSMENT AND PLAN / ED COURSE  ?I reviewed the triage vital signs and the nursing notes. ? ?Patient presents to the emergency department for  cute onset of weakness nausea vomiting patient found to be hypotensive 86/43 on arrival.  I spoke to the daughter over the phone to acquire more history.  Patient is sluggish to respond but does answer most questions and follows commands.  No obvious neurologic deficits on my exam besides the sluggish responses.  We will check labs, CT scan of the head and chest x-ray.  We will obtain a COVID swab and begin IV hydrating.  Differential is quite broad however I do suspect the patient's symptoms of sluggishness weakness and nausea could be related to her hypotension.  Hypotension could be due to a number of causes such as infectious etiology, electrolyte or metabolic abnormality such as renal dysfunction, dehydration/hypovolemia.  Daughter states the patient has a DO NOT RESUSCITATE order.   ? ?Patient's work-up shows a moderate leukocytosis of 18,000, mild renal insufficiency.  Slight troponin elevation at 39.  Urinalysis does show many bacteria.  COVID/flu negative.  Blood pressure is improving with fluids.  Patient admitted to the hospital service for further work-up and treatment. ? ?FINAL CLINICAL IMPRESSION(S) / ED DIAGNOSES  ? ?Weakness ?Hypotension ? ?Note:  This document was prepared using Dragon voice recognition software and may include unintentional dictation errors. ?  Harvest Dark, MD ?01/12/22 2319 ? ?

## 2022-01-10 DIAGNOSIS — E039 Hypothyroidism, unspecified: Secondary | ICD-10-CM | POA: Diagnosis present

## 2022-01-10 DIAGNOSIS — Z20822 Contact with and (suspected) exposure to covid-19: Secondary | ICD-10-CM | POA: Diagnosis present

## 2022-01-10 DIAGNOSIS — I130 Hypertensive heart and chronic kidney disease with heart failure and stage 1 through stage 4 chronic kidney disease, or unspecified chronic kidney disease: Secondary | ICD-10-CM | POA: Diagnosis present

## 2022-01-10 DIAGNOSIS — D649 Anemia, unspecified: Secondary | ICD-10-CM | POA: Diagnosis present

## 2022-01-10 DIAGNOSIS — K219 Gastro-esophageal reflux disease without esophagitis: Secondary | ICD-10-CM | POA: Diagnosis present

## 2022-01-10 DIAGNOSIS — Z87891 Personal history of nicotine dependence: Secondary | ICD-10-CM | POA: Diagnosis not present

## 2022-01-10 DIAGNOSIS — Z7901 Long term (current) use of anticoagulants: Secondary | ICD-10-CM | POA: Diagnosis not present

## 2022-01-10 DIAGNOSIS — I4892 Unspecified atrial flutter: Secondary | ICD-10-CM | POA: Diagnosis present

## 2022-01-10 DIAGNOSIS — S51812A Laceration without foreign body of left forearm, initial encounter: Secondary | ICD-10-CM | POA: Diagnosis present

## 2022-01-10 DIAGNOSIS — F0394 Unspecified dementia, unspecified severity, with anxiety: Secondary | ICD-10-CM | POA: Diagnosis present

## 2022-01-10 DIAGNOSIS — N179 Acute kidney failure, unspecified: Secondary | ICD-10-CM | POA: Diagnosis present

## 2022-01-10 DIAGNOSIS — G9341 Metabolic encephalopathy: Secondary | ICD-10-CM | POA: Diagnosis present

## 2022-01-10 DIAGNOSIS — E785 Hyperlipidemia, unspecified: Secondary | ICD-10-CM | POA: Diagnosis present

## 2022-01-10 DIAGNOSIS — Z90710 Acquired absence of both cervix and uterus: Secondary | ICD-10-CM | POA: Diagnosis not present

## 2022-01-10 DIAGNOSIS — J449 Chronic obstructive pulmonary disease, unspecified: Secondary | ICD-10-CM | POA: Diagnosis present

## 2022-01-10 DIAGNOSIS — N1832 Chronic kidney disease, stage 3b: Secondary | ICD-10-CM | POA: Diagnosis present

## 2022-01-10 DIAGNOSIS — N390 Urinary tract infection, site not specified: Secondary | ICD-10-CM | POA: Diagnosis present

## 2022-01-10 DIAGNOSIS — Z95810 Presence of automatic (implantable) cardiac defibrillator: Secondary | ICD-10-CM | POA: Diagnosis not present

## 2022-01-10 DIAGNOSIS — K7460 Unspecified cirrhosis of liver: Secondary | ICD-10-CM | POA: Diagnosis present

## 2022-01-10 DIAGNOSIS — A419 Sepsis, unspecified organism: Secondary | ICD-10-CM | POA: Diagnosis present

## 2022-01-10 DIAGNOSIS — C50811 Malignant neoplasm of overlapping sites of right female breast: Secondary | ICD-10-CM | POA: Diagnosis present

## 2022-01-10 DIAGNOSIS — I5032 Chronic diastolic (congestive) heart failure: Secondary | ICD-10-CM | POA: Diagnosis present

## 2022-01-10 DIAGNOSIS — Z923 Personal history of irradiation: Secondary | ICD-10-CM | POA: Diagnosis not present

## 2022-01-10 DIAGNOSIS — Z66 Do not resuscitate: Secondary | ICD-10-CM | POA: Diagnosis present

## 2022-01-10 DIAGNOSIS — R6520 Severe sepsis without septic shock: Secondary | ICD-10-CM | POA: Diagnosis present

## 2022-01-10 LAB — BASIC METABOLIC PANEL
Anion gap: 8 (ref 5–15)
BUN: 37 mg/dL — ABNORMAL HIGH (ref 8–23)
CO2: 28 mmol/L (ref 22–32)
Calcium: 8.3 mg/dL — ABNORMAL LOW (ref 8.9–10.3)
Chloride: 102 mmol/L (ref 98–111)
Creatinine, Ser: 1.55 mg/dL — ABNORMAL HIGH (ref 0.44–1.00)
GFR, Estimated: 35 mL/min — ABNORMAL LOW (ref >60)
Glucose, Bld: 121 mg/dL — ABNORMAL HIGH (ref 70–99)
Potassium: 4.2 mmol/L (ref 3.5–5.1)
Sodium: 138 mmol/L (ref 135–145)

## 2022-01-10 LAB — SURGICAL PCR SCREEN
MRSA, PCR: NEGATIVE
Staphylococcus aureus: POSITIVE — AB

## 2022-01-10 LAB — CBC
HCT: 35.8 % — ABNORMAL LOW (ref 36.0–46.0)
Hemoglobin: 11.2 g/dL — ABNORMAL LOW (ref 12.0–15.0)
MCH: 29.7 pg (ref 26.0–34.0)
MCHC: 31.3 g/dL (ref 30.0–36.0)
MCV: 95 fL (ref 80.0–100.0)
Platelets: 211 10*3/uL (ref 150–400)
RBC: 3.77 MIL/uL — ABNORMAL LOW (ref 3.87–5.11)
RDW: 14.2 % (ref 11.5–15.5)
WBC: 18.1 10*3/uL — ABNORMAL HIGH (ref 4.0–10.5)
nRBC: 0 % (ref 0.0–0.2)

## 2022-01-10 LAB — VANCOMYCIN, RANDOM
Vancomycin Rm: 20
Vancomycin Rm: 10

## 2022-01-10 MED ORDER — MUPIROCIN 2 % EX OINT
1.0000 "application " | TOPICAL_OINTMENT | Freq: Two times a day (BID) | CUTANEOUS | Status: DC
  Administered 2022-01-10 – 2022-01-14 (×8): 1 via NASAL
  Filled 2022-01-10 (×2): qty 22

## 2022-01-10 MED ORDER — VANCOMYCIN HCL 750 MG/150ML IV SOLN
750.0000 mg | INTRAVENOUS | Status: DC
  Administered 2022-01-11: 750 mg via INTRAVENOUS
  Filled 2022-01-10: qty 150

## 2022-01-10 MED ORDER — SODIUM CHLORIDE 0.9 % IV SOLN
2.0000 g | Freq: Two times a day (BID) | INTRAVENOUS | Status: DC
  Administered 2022-01-10 (×2): 2 g via INTRAVENOUS
  Filled 2022-01-10 (×2): qty 12.5
  Filled 2022-01-10: qty 2

## 2022-01-10 MED ORDER — CHLORHEXIDINE GLUCONATE CLOTH 2 % EX PADS
6.0000 | MEDICATED_PAD | Freq: Every day | CUTANEOUS | Status: DC
Start: 2022-01-11 — End: 2022-01-14
  Administered 2022-01-11 – 2022-01-14 (×3): 6 via TOPICAL

## 2022-01-10 MED ORDER — VANCOMYCIN HCL IN DEXTROSE 1-5 GM/200ML-% IV SOLN: 1000.0000 mg | INTRAVENOUS | Status: DC

## 2022-01-10 NOTE — Assessment & Plan Note (Signed)
Noted  

## 2022-01-10 NOTE — Assessment & Plan Note (Signed)
Possibly delusional.  Initial hemoglobin was 12.1, possibly hemoconcentration. ?Monitor CBC for now. ?

## 2022-01-10 NOTE — Plan of Care (Signed)
Initial Add ? ? ?Problem: Education: ?Goal: Knowledge of General Education information will improve ?Description: Including pain rating scale, medication(s)/side effects and non-pharmacologic comfort measures ?Outcome: Not Applicable ?  ?Problem: Health Behavior/Discharge Planning: ?Goal: Ability to manage health-related needs will improve ?Outcome: Not Applicable ?  ?Problem: Clinical Measurements: ?Goal: Ability to maintain clinical measurements within normal limits will improve ?Outcome: Not Applicable ?Goal: Will remain free from infection ?Outcome: Not Applicable ?Goal: Diagnostic test results will improve ?Outcome: Not Applicable ?Goal: Respiratory complications will improve ?Outcome: Not Applicable ?Goal: Cardiovascular complication will be avoided ?Outcome: Not Applicable ?  ?Problem: Activity: ?Goal: Risk for activity intolerance will decrease ?Outcome: Not Applicable ?  ?Problem: Nutrition: ?Goal: Adequate nutrition will be maintained ?Outcome: Not Applicable ?  ?Problem: Coping: ?Goal: Level of anxiety will decrease ?Outcome: Not Applicable ?  ?Problem: Elimination: ?Goal: Will not experience complications related to bowel motility ?Outcome: Not Applicable ?Goal: Will not experience complications related to urinary retention ?Outcome: Not Applicable ?  ?Problem: Pain Managment: ?Goal: General experience of comfort will improve ?Outcome: Not Applicable ?  ?Problem: Safety: ?Goal: Ability to remain free from injury will improve ?Outcome: Not Applicable ?  ?Problem: Skin Integrity: ?Goal: Risk for impaired skin integrity will decrease ?Outcome: Not Applicable ?  ?Problem: Education: ?Goal: Knowledge of disease and its progression will improve ?Outcome: Not Applicable ?Goal: Individualized Educational Video(s) ?Outcome: Not Applicable ?  ?Problem: Fluid Volume: ?Goal: Compliance with measures to maintain balanced fluid volume will improve ?Outcome: Not Applicable ?  ?Problem: Health Behavior/Discharge  Planning: ?Goal: Ability to manage health-related needs will improve ?Outcome: Not Applicable ?  ?Problem: Nutritional: ?Goal: Ability to make healthy dietary choices will improve ?Outcome: Not Applicable ?  ?Problem: Clinical Measurements: ?Goal: Complications related to the disease process, condition or treatment will be avoided or minimized ?Outcome: Not Applicable ?  ?Problem: Urinary Elimination: ?Goal: Signs and symptoms of infection will decrease ?Outcome: Not Applicable ?  ?

## 2022-01-10 NOTE — Assessment & Plan Note (Addendum)
Fall precautions ?PT and OT evaluations ?SNF recommended ?

## 2022-01-10 NOTE — Consult Note (Signed)
Pharmacy Antibiotic Note ? ?Adriana Martin is a 76 y.o. female with CKD Stage 3b admitted on 01/09/2022 with sepsis.  Pharmacy has been consulted for Cefepime and Vancomcyin dosing. ? ?Scr on admission of 1.87. Baseline appears to be around 1.42-1.65.  ? ?Plan: ?Give Vancomycin 2gm x1 followed by dosing per random levels based given AKI ?Give Cefepime 2gm IV every 24 hours ? ?4/22 2224 Vanc Random = 10 ?Based on current SCr 1.54 (4/22), ordered vancomycin 750 mg IV Q 24 hrs.  ?Goal AUC 400-550. ?Expected AUC: 530.5 ?SCr used: 1.54 (4/22) ? ?Will continue to follow SCr closely and will order additional vanc level and adjust abx dosing as warranted. ? ? ?Height: '5\' 2"'$  (157.5 cm) ?Weight: 79.6 kg (175 lb 7.8 oz) ?IBW/kg (Calculated) : 50.1 ? ?Temp (24hrs), Avg:98.2 ?F (36.8 ?C), Min:97.8 ?F (36.6 ?C), Max:98.9 ?F (37.2 ?C) ? ?Recent Labs  ?Lab 01/09/22 ?1419 01/09/22 ?1421 01/09/22 ?2025 01/09/22 ?2208 01/10/22 ?7846 01/10/22 ?2224  ?WBC 18.4*  --   --   --  18.1*  --   ?CREATININE  --  1.87*  --   --  1.55*  --   ?LATICACIDVEN  --   --  1.9 1.9  --   --   ?VANCORANDOM  --   --   --   --  20 10  ? ?  ?Estimated Creatinine Clearance: 30.2 mL/min (A) (by C-G formula based on SCr of 1.55 mg/dL (H)).   ? ?Allergies  ?Allergen Reactions  ? Atorvastatin Shortness Of Breath  ?  Tolerates pravastatin  ? Calcium Shortness Of Breath  ? Fluoxetine   ?  Other reaction(s): Unknown  ? Hydrochlorothiazide   ?  Other reaction(s): Unknown  ? ? ?Antimicrobials this admission: ?Vancomycin  >>  ?Cefepime  >>  ? ?Dose adjustments this admission: ? ? ?Microbiology results: ?4/21 BCx: sent ?4/21 MRSA PCR: ordered ? ? ?Thank you for allowing pharmacy to be a part of this pleasant patient?s care. ? ?Renda Rolls, PharmD, MBA ?01/10/2022 ?11:19 PM ? ? ? ?

## 2022-01-10 NOTE — Progress Notes (Signed)
Patient's daughter explained that left sutured site is from a surgical procedure where a plate was removed from forearm. ?

## 2022-01-10 NOTE — Progress Notes (Addendum)
?Progress Note ? ? ?Patient: Adriana Martin MLY:[id number] DOB: April 17, 1946 DOA: 01/09/2022     0 ?DOS: the patient was seen and examined on 01/10/2022 ?  ?Brief hospital course: ?[name] is a 76 year old female with history of dementia, hyperlipidemia, hypertension, COPD, hypothyroid, atrial flutter on anticoagulation, history of malignant neoplasm of the right breast, ER positive, history of CVA, CKD stage IIIb, who presented to the ED on 01/09/2022 for evaluation after having a syncopal episode. ? ?Initial BP was 86/43 which improved with bolused IV fluids.   ?Otherwise vitals were stable in the ED. ?Initial labs notable for Cr 1.87, WBC 18.4k.  Hs-troponin 52>>39.  UA suggests possible infection with many bacteria, only trace leukocytes, 6-10 WBC's, glucosuria. ? ?Imaging - CXR negative.   ?CT of the head without contrast: "expected evolution of prior infarct within the left parietal and mildly left temporal region, now chronic encephalomalacia.  Unchanged smaller left occipital and right cerebellar remote infarcts.  No acute intracranial hemorrhage.  No acute intracranial process." ? ?Started on empiric broad spectrum antibiotics for sepsis due to probable UTI.  Admitted to the hospital for further evaluation and management. ? ?Assessment and Plan: ?* Severe sepsis with acute organ dysfunction ([place]) ?Present on admission as evidenced by leukocytosis, hypotension and acute encephalopathy consistent with organ dysfunction in the setting of likely UTI. ?Treated per sepsis protocol in ED with fluid boluses and IV antibiotics. ?-- Started on empiric vancomycin and cefepime ?-- Stop vancomycin ?-- De-escalate antibiotics as cultures result ?-- Monitor fever curve and CBC, hemodynamics ?-- Follow cultures ? ? ?Laceration of left forearm ?With sutures in place on admission. ?Laceration appears well-approximated and healing, no surrounding warmth, swelling, erythema or induration to suggest  infection. ?--local wound care ?--monitor closely ? ?S/P mitral valve repair ?Noted ? ?Recurrent falls ?Fall precautions ?PT and OT evaluations ? ?ICD (implantable cardioverter-defibrillator), biventricular, in situ ?Noted ? ?GERD (gastroesophageal reflux disease) ?Continue Protonix ? ?Weakness ?Generalized, in the setting of hypotension and sepsis.  Unclear mobility at baseline. ?-- Fall precautions ?--PT and OT evaluations ? ?Cardiac arrest [facility]) ?History of V. tach cardiac arrest in 04/10/2021, patient received CPR and ACLS protocol, was intubated and transferred to ICU care.  V. tach was presumed secondary to hypokalemia as her ejection fraction was normal.  Patient also received defibrillation. She was started on amiodarone gtt. and transition to p.o. amiodarone prior to discharge ?- Per med reconciliation, patient is no longer on amiodarone ? ?Dementia ([place]) ?No apparent behavioral disturbances. ?-- Delirium precautions ?--Continue home Aricept ? ?Hypothyroid ?Continue levothyroxine.  TSH within normal limits ? ?COPD (chronic obstructive pulmonary disease) ([place]) ?Not acutely exacerbated, no wheezing on exam ?Continue formulary equivalents for home maintenance inhaler/s.  As needed albuterol nebs ? ?HTN (hypertension) ?Home lisinopril held on admission due to AKI and hypotension, resume when able. ?Continue as needed hydralazine for now.  Monitor BP.  Titrate antihypertensives ? ? ?Acute kidney injury superimposed on CKD ([place]) ?Present on admission with creatinine of 1.87. ?Baseline CKD 3B, baseline Cr 1.42-1.65. ?Likely prerenal azotemia in the setting of hypotension and sepsis. ?Renal function improved with IV fluids. ? ?Creatinine today 1.55. ?--Monitor BMP ?-- Avoid nephrotoxins, hypotension, maintain MAP above 65 ?-- Renally dose meds ?-- Monitor off IV fluids for now ? ? ?Hyperlipidemia ?Continue pravastatin ? ?Carcinoma of overlapping sites of right breast in female, estrogen receptor positive ([place]) ?-  Continue follow-up outpatient ? ?A-fib ([place]) ?History of A-fib/flutter.  Currently rate controlled. ?Continue Eliquis. ?Appears not  on rate control agent outpatient. ?IV Lopressor as needed ? ?Anxiety ?Continue home Effexor ?Ativan as needed for anxiety or seizure activity ? ?Anemia ?Possibly delusional.  Initial hemoglobin was 12.1, possibly hemoconcentration. ?Monitor CBC for now. ? ?Atrial flutter ([place]) ?See A-fib ? ? ? ? ?  ? ?Subjective: Patient was awake sitting up in bed when seen on rounds this morning.  She denies any acute complaints.  She is not able to tell me her daughter's name.  Minimally conversant which appears baseline with her dementia. ? ?Physical Exam: ?Vitals:  ? 01/10/22 0414 01/10/22 0753 01/10/22 1128 01/10/22 1544  ?BP: (!) 117/54 (!) 104/53 (!) 115/47 (!) 117/53  ?Pulse: 62 (!) 58 (!) 58 (!) 58  ?Resp: '13 20 20 18  '$ ?Temp: 97.8 ?F (36.6 ?C) 98 ?F (36.7 ?C) 98.1 ?F (36.7 ?C) 98.4 ?F (36.9 ?C)  ?TempSrc:      ?SpO2: 98% 99% 99% 98%  ?Weight:      ?Height:      ? ?General exam: awake, alert, no acute distress, obese ?HEENT: moist mucus membranes, hearing grossly normal  ?Respiratory system: CTAB with diminished bases likely due to body habitus, no wheezes, rales or rhonchi, normal respiratory effort. ?Cardiovascular system: normal S1/S2, RRR, no lower extremity edema.   ?Gastrointestinal system: soft, nontender ?Central nervous system: Alert, oriented only to self, no gross focal neurologic deficits, normal speech ?Extremities: moves all, no edema, normal tone ?Skin: dry, intact, normal temperature, face flushing noted ?Psychiatry: normal mood, congruent affect, abnormal judgment and insight due to dementia ? ? ?Data Reviewed: ? ?Labs notable for: Glucose 121, BUN 37, creatinine 1.55 improved, calcium 8.3, GFR 35, WBC 18.1, hemoglobin 11.2, MRSA PCR negative but Staph aureus positive ? ?Blood cultures no growth at less than 12 hours. ?C. difficile pending ?Urine culture pending ? ?Family  Communication: None at bedside, will attempt to call daughter this afternoon as time allows ? ?Disposition: ?Status is: Inpatient ?Remains inpatient appropriate because: remains on empiric IV antibiotics for severe sepsis pending cultures. ? ? ? ? Planned Discharge Destination: Skilled nursing facility ? ? ? ?Time spent: 45 minutes ? ?Author: ?Ezekiel Slocumb, DO ?01/10/2022 4:09 PM ? ?For on call review [email or web address].  ?

## 2022-01-10 NOTE — Progress Notes (Signed)
Called patient's daughter [name] @ [phone] to complete admission assessment information.  ? ? ?

## 2022-01-10 NOTE — Assessment & Plan Note (Addendum)
With sutures in place on admission. ?Laceration appears well-approximated and healing, no surrounding warmth, swelling, erythema or induration to suggest infection. ?--local wound care ?--monitor closely ?

## 2022-01-10 NOTE — Assessment & Plan Note (Signed)
History of A-fib/flutter.  Currently rate controlled. ?Continue Eliquis. ?Appears not on rate control agent outpatient. ?IV Lopressor as needed ?

## 2022-01-10 NOTE — Progress Notes (Signed)
PHARMACY NOTE:  ANTIMICROBIAL RENAL DOSAGE ADJUSTMENT ? ?Current antimicrobial regimen includes a mismatch between antimicrobial dosage and estimated renal function.  As per policy approved by the Pharmacy & Therapeutics and Medical Executive Committees, the antimicrobial dosage will be adjusted accordingly. ? ?Current antimicrobial dosage:  Cefepime 2 g IV q24h ? ?Indication: Sepsis ? ?Renal Function: ? ?Estimated Creatinine Clearance: 30.2 mL/min (A) (by C-G formula based on SCr of 1.55 mg/dL (H)). ?   ?Antimicrobial dosage has been changed to:  Cefepime 2 g IV q12h ? ?Thank you for allowing pharmacy to be a part of this patient's care. ? ?Benita Gutter, [facility] ?01/10/2022 8:09 AM ? ? ?   ?

## 2022-01-10 NOTE — Assessment & Plan Note (Signed)
Generalized, in the setting of hypotension and sepsis.  Unclear mobility at baseline. ?-- Fall precautions ?--PT and OT evaluations ?

## 2022-01-10 NOTE — Assessment & Plan Note (Signed)
Continue Protonix °

## 2022-01-11 DIAGNOSIS — R6520 Severe sepsis without septic shock: Secondary | ICD-10-CM | POA: Diagnosis not present

## 2022-01-11 DIAGNOSIS — A419 Sepsis, unspecified organism: Secondary | ICD-10-CM | POA: Diagnosis not present

## 2022-01-11 LAB — BASIC METABOLIC PANEL
Anion gap: 8 (ref 5–15)
BUN: 32 mg/dL — ABNORMAL HIGH (ref 8–23)
CO2: 29 mmol/L (ref 22–32)
Calcium: 8.6 mg/dL — ABNORMAL LOW (ref 8.9–10.3)
Chloride: 102 mmol/L (ref 98–111)
Creatinine, Ser: 1.46 mg/dL — ABNORMAL HIGH (ref 0.44–1.00)
GFR, Estimated: 37 mL/min — ABNORMAL LOW (ref >60)
Glucose, Bld: 84 mg/dL (ref 70–99)
Potassium: 3.8 mmol/L (ref 3.5–5.1)
Sodium: 139 mmol/L (ref 135–145)

## 2022-01-11 LAB — CBC
HCT: 33.2 % — ABNORMAL LOW (ref 36.0–46.0)
Hemoglobin: 10.5 g/dL — ABNORMAL LOW (ref 12.0–15.0)
MCH: 29.2 pg (ref 26.0–34.0)
MCHC: 31.6 g/dL (ref 30.0–36.0)
MCV: 92.5 fL (ref 80.0–100.0)
Platelets: 182 10*3/uL (ref 150–400)
RBC: 3.59 MIL/uL — ABNORMAL LOW (ref 3.87–5.11)
RDW: 13.9 % (ref 11.5–15.5)
WBC: 11.7 10*3/uL — ABNORMAL HIGH (ref 4.0–10.5)
nRBC: 0 % (ref 0.0–0.2)

## 2022-01-11 LAB — MAGNESIUM: Magnesium: 2.1 mg/dL (ref 1.7–2.4)

## 2022-01-11 MED ORDER — CEFTRIAXONE SODIUM 1 G IJ SOLR
1.0000 g | INTRAMUSCULAR | Status: DC
Start: 2022-01-11 — End: 2022-01-13
  Administered 2022-01-11 – 2022-01-13 (×3): 1 g via INTRAVENOUS
  Filled 2022-01-11 (×2): qty 10
  Filled 2022-01-11: qty 1

## 2022-01-11 NOTE — Progress Notes (Signed)
Patient had a bowel movement but stool too formed for Cdiff sampling. ?

## 2022-01-11 NOTE — Progress Notes (Addendum)
?Progress Note ? ? ?Patient: Adriana Martin CVE:[id number] DOB: [birth year] DOA: 01/09/2022     1 ?DOS: the patient was seen and examined on 01/11/2022 ?  ?Brief hospital course: ?Ms. Robyn Galati is a 76 year old female with history of dementia, hyperlipidemia, hypertension, COPD, hypothyroid, atrial flutter on anticoagulation, history of malignant neoplasm of the right breast, ER positive, history of CVA, CKD stage IIIb, who presented to the ED on 01/09/2022 for evaluation after having a syncopal episode. ? ?Initial BP was 86/43 which improved with bolused IV fluids.   ?Otherwise vitals were stable in the ED. ?Initial labs notable for Cr 1.87, WBC 18.4k.  Hs-troponin 52>>39.  UA suggests possible infection with many bacteria, only trace leukocytes, 6-10 WBC's, glucosuria. ? ?Imaging - CXR negative.   ?CT of the head without contrast: "expected evolution of prior infarct within the left parietal and mildly left temporal region, now chronic encephalomalacia.  Unchanged smaller left occipital and right cerebellar remote infarcts.  No acute intracranial hemorrhage.  No acute intracranial process." ? ?Started on empiric broad spectrum antibiotics for sepsis due to probable UTI.  Admitted to the hospital for further evaluation and management. ? ?Assessment and Plan: ?* Severe sepsis with acute organ dysfunction ([place]) ?Present on admission as evidenced by leukocytosis, hypotension and acute encephalopathy consistent with organ dysfunction in the setting of likely UTI. ?Treated per sepsis protocol in ED with fluid boluses and IV antibiotics. ?-- Started on empiric vancomycin and cefepime ?-- Stopped vancomycin ?-- Transition Cefepime >> Rocephin for empiric UTI overage ?-- De-escalate antibiotics as cultures result ?-- Monitor fever curve and CBC, hemodynamics ?-- Follow cultures ? ? ?Laceration of left forearm ?With sutures in place on admission. ?Laceration appears well-approximated and healing, no surrounding  warmth, swelling, erythema or induration to suggest infection. ?--local wound care ?--monitor closely ? ?S/P mitral valve repair ?Noted ? ?Recurrent falls ?Fall precautions ?PT and OT evaluations ?SNF recommended ? ?ICD (implantable cardioverter-defibrillator), biventricular, in situ ?Noted ? ?GERD (gastroesophageal reflux disease) ?Continue Protonix ? ?Weakness ?Generalized, in the setting of hypotension and sepsis.  Unclear mobility at baseline. ?-- Fall precautions ?--PT and OT evaluations ? ?Cardiac arrest [facility]) ?History of V. tach cardiac arrest in 04/10/2021, patient received CPR and ACLS protocol, was intubated and transferred to ICU care.  V. tach was presumed secondary to hypokalemia as her ejection fraction was normal.  Patient also received defibrillation. She was started on amiodarone gtt. and transition to p.o. amiodarone prior to discharge ?- Per med reconciliation, patient is no longer on amiodarone ? ?Dementia ([place]) ?No apparent behavioral disturbances. ?-- Delirium precautions ?--Continue home Aricept ? ?Hypothyroid ?Continue levothyroxine.  TSH within normal limits ? ?COPD (chronic obstructive pulmonary disease) ([place]) ?Not acutely exacerbated, no wheezing on exam ?Continue formulary equivalents for home maintenance inhaler/s.  As needed albuterol nebs ? ?HTN (hypertension) ?Home lisinopril held on admission due to AKI and hypotension, resume when able. ?Continue as needed hydralazine for now.  Monitor BP.  Titrate antihypertensives ? ? ?Acute kidney injury superimposed on CKD ([place]) ?Present on admission with creatinine of 1.87. ?Baseline CKD 3B, baseline Cr 1.42-1.65. ?Likely prerenal azotemia in the setting of hypotension and sepsis. ?Renal function improved with IV fluids. ? ?Creatinine today 1.46. ?--Monitor BMP ?-- Avoid nephrotoxins, hypotension, maintain MAP above 65 ?-- Renally dose meds ?-- Monitor off IV fluids for now ? ? ?Hyperlipidemia ?Continue pravastatin ? ?Carcinoma of overlapping  sites of right breast in female, estrogen receptor positive ([place]) ?- Continue follow-up outpatient ? ?A-fib ([place]) ?  History of A-fib/flutter.  Currently rate controlled. ?Continue Eliquis. ?Appears not on rate control agent outpatient. ?IV Lopressor as needed ? ?Anxiety ?Continue home Effexor ?Ativan as needed for anxiety or seizure activity ? ?Anemia ?Possibly delusional.  Initial hemoglobin was 12.1, possibly hemoconcentration. ?Monitor CBC for now. ? ?Atrial flutter ([place]) ?See A-fib ? ? ? ? ?  ? ?Subjective: Patient was asleep sitting up in bed, woke easily to voice when seen today.  She denies pain or feeling sick at all.  Poor historian due to dementia.  No behavioral disturbances reported.  No other acute events reported. ? ?Physical Exam: ?Vitals:  ? 01/10/22 2354 01/11/22 0359 01/11/22 0823 01/11/22 1206  ?BP: 129/65 (!) 126/59 130/63 (!) 124/59  ?Pulse: 63 60 64 63  ?Resp: '18 16 17 17  '$ ?Temp: 98.1 ?F (36.7 ?C) 98.1 ?F (36.7 ?C) 98.2 ?F (36.8 ?C) 98.1 ?F (36.7 ?C)  ?TempSrc: Oral Oral Oral Oral  ?SpO2: 99% 100% 99% 98%  ?Weight:      ?Height:      ? ?General exam: Asleep sitting up, woke easily to voice, no acute distress ?HEENT: moist mucus membranes, hearing grossly normal  ?Respiratory system: CTAB, no wheezes, rales or rhonchi, normal respiratory effort. ?Cardiovascular system: normal S1/S2, RRR, no lower extremity edema.   ?Gastrointestinal system: soft, nontender abdomen ?Central nervous system: A&O x self. no gross focal neurologic deficits, normal speech ?Extremities: No peripheral edema, TED hose on bilateral lower extremities, no edema, normal tone ?Skin: Left forearm laceration with sutures intact (image below) dry, intact, normal temperature ?Psychiatry: normal mood, congruent affect, judgement and insight appear normal ? ? ? ? ? ? ?Data Reviewed: ? ?Notable labs: BUN 32, creatinine improving 1.46, calcium 8.6, GFR 37, WBC 11.7 improved ? ?Family Communication: None at bedside on rounds, will  attempt to call daughter as time allows ? ?Disposition: ?Status is: Inpatient ?Remains inpatient appropriate because: Remains on IV antibiotics pending culture results.  Requires SNF placement for rehab at discharge. ? ? Planned Discharge Destination: Skilled nursing facility ? ? ? ?Time spent: 35 minutes ? ?Author: ?Ezekiel Slocumb, DO ?01/11/2022 2:05 PM ? ?For on call review [email or web address].  ?

## 2022-01-12 DIAGNOSIS — R6520 Severe sepsis without septic shock: Secondary | ICD-10-CM | POA: Diagnosis not present

## 2022-01-12 DIAGNOSIS — R197 Diarrhea, unspecified: Secondary | ICD-10-CM | POA: Diagnosis present

## 2022-01-12 DIAGNOSIS — A419 Sepsis, unspecified organism: Secondary | ICD-10-CM | POA: Diagnosis not present

## 2022-01-12 LAB — BASIC METABOLIC PANEL
Anion gap: 9 (ref 5–15)
BUN: 22 mg/dL (ref 8–23)
CO2: 27 mmol/L (ref 22–32)
Calcium: 8.7 mg/dL — ABNORMAL LOW (ref 8.9–10.3)
Chloride: 101 mmol/L (ref 98–111)
Creatinine, Ser: 1.17 mg/dL — ABNORMAL HIGH (ref 0.44–1.00)
GFR, Estimated: 48 mL/min — ABNORMAL LOW (ref >60)
Glucose, Bld: 90 mg/dL (ref 70–99)
Potassium: 3.6 mmol/L (ref 3.5–5.1)
Sodium: 137 mmol/L (ref 135–145)

## 2022-01-12 LAB — CBC
HCT: 33.5 % — ABNORMAL LOW (ref 36.0–46.0)
Hemoglobin: 10.6 g/dL — ABNORMAL LOW (ref 12.0–15.0)
MCH: 29.3 pg (ref 26.0–34.0)
MCHC: 31.6 g/dL (ref 30.0–36.0)
MCV: 92.5 fL (ref 80.0–100.0)
Platelets: 191 10*3/uL (ref 150–400)
RBC: 3.62 MIL/uL — ABNORMAL LOW (ref 3.87–5.11)
RDW: 13.8 % (ref 11.5–15.5)
WBC: 10.3 10*3/uL (ref 4.0–10.5)
nRBC: 0 % (ref 0.0–0.2)

## 2022-01-12 NOTE — Assessment & Plan Note (Signed)
POA, resolved on its own. ?Cancelled orders for C. Diff testing and enteric precautions. ?Monitor. ?

## 2022-01-12 NOTE — Evaluation (Signed)
Occupational Therapy Evaluation ?Patient Details ?Name: Adriana Martin ?MRN: [id number] ?DOB: 09/12/1946 ?Today's Date: 01/12/2022 ? ? ?History of Present Illness Patient is a 76 year old female with  history of dementia, hyperlipidemia, hypertension, COPD, hypothyroid, atrial flutter on anticoagulation, history of malignant neoplasm of the right breast, ER positive, history of CVA, CKD stage IIIb, who presented to the ED on 01/09/2022 for evaluation after having a syncopal episode. Found to have severe sepsis with acute organ dysfunction  ? ?Clinical Impression ?  ?Ms Beckworth was seen for OT/PT co-evaluation this date. Per chart, pt lives with family in home c ramped entrance. Pt oriented to name only - repeatedly states "I dont know" when asked birth date, location, etc. Pt presents to acute OT demonstrating impaired ADL performance and functional mobility 2/2 decreased activity tolerance and functional strength/ROM/balance deficits. Pt currently requires MIN A don B socks at bed level. CGA + RW for simulated toilet t/f. Pt requires step by step cues for initiating mobility and ADL tasks and for safety t/o.   ? ?Pt would benefit from skilled OT to address noted impairments and functional limitations (see below for any additional details). Upon hospital discharge, recommend STR however if pt has 24/7 assistance from family then returning to a familiar environment with [facility] is appropriate. ?   ? ?Recommendations for follow up therapy are one component of a multi-disciplinary discharge planning process, led by the attending physician.  Recommendations may be updated based on patient status, additional functional criteria and insurance authorization.  ? ?Follow Up Recommendations ? Skilled nursing-short term rehab (<3 hours/day)  ?  ?Assistance Recommended at Discharge Frequent or constant Supervision/Assistance  ?Patient can return home with the following A lot of help with walking and/or transfers;A lot of help with  bathing/dressing/bathroom;Help with stairs or ramp for entrance ? ?  ?Functional Status Assessment ? Patient has had a recent decline in their functional status and demonstrates the ability to make significant improvements in function in a reasonable and predictable amount of time.  ?Equipment Recommendations ? BSC/3in1  ?  ?Recommendations for Other Services   ? ? ?  ?Precautions / Restrictions Precautions ?Precautions: Fall ?Restrictions ?Weight Bearing Restrictions: No  ? ?  ? ?Mobility Bed Mobility ?Overal bed mobility: Needs Assistance ?Bed Mobility: Supine to Sit, Sit to Supine ?  ?  ?Supine to sit: Min assist ?Sit to supine: Mod assist ?  ?General bed mobility comments: cues to sequence task ?  ? ?Transfers ?Overall transfer level: Needs assistance ?Equipment used: Rolling walker (2 wheels) ?Transfers: Sit to/from Stand ?Sit to Stand: Min guard ?  ?  ?  ?  ?  ?  ?  ? ?  ?Balance Overall balance assessment: Needs assistance ?Sitting-balance support: Feet supported ?Sitting balance-Leahy Scale: Good ?  ?  ?Standing balance support: Bilateral upper extremity supported ?Standing balance-Leahy Scale: Fair ?Standing balance comment: Min guard for safety ?  ?  ?  ?  ?  ?  ?  ?  ?  ?  ?  ?   ? ?ADL either performed or assessed with clinical judgement  ? ?ADL Overall ADL's : Needs assistance/impaired ?  ?  ?  ?  ?  ?  ?  ?  ?  ?  ?  ?  ?  ?  ?  ?  ?  ?  ?  ?General ADL Comments: MIN A don B socks at bed level. CGA + RW for simulated toilet t/f  ? ? ? ? ?  Pertinent Vitals/Pain Pain Assessment ?Pain Assessment: No/denies pain  ? ? ? ?Hand Dominance   ?  ?Extremity/Trunk Assessment Upper Extremity Assessment ?Upper Extremity Assessment: Generalized weakness ?  ?Lower Extremity Assessment ?Lower Extremity Assessment: Generalized weakness ?  ?  ?  ?Communication Communication ?Communication: No difficulties ?  ?Cognition Arousal/Alertness: Awake/alert ?Behavior During Therapy: [facility] for tasks assessed/performed ?Overall  Cognitive Status: History of cognitive impairments - at baseline ?  ?  ?  ?  ?  ?  ?  ?  ?  ?  ?  ?  ?  ?  ?  ?  ?General Comments: patient is oriented to person only. she is able to follow single step commands with increased time ?  ?  ?General Comments  vitals monitored throughout session. no reported dizziness with activity. blood pressure supine 144/62, initial sitting 86/47, sitting for 3 minutes 154/67, standing 153/69. Sp02 91% while ambulating on room air, and 94% at rest on room air ? ?  ? ?Home Living Family/patient expects to be discharged to:: Private residence ?Living Arrangements: Children ?Available Help at Discharge: Available PRN/intermittently;Family ?Type of Home: Mobile home ?Home Access: Ramped entrance ?  ?  ?Home Layout: One level ?  ?  ?  ?  ?  ?  ?  ?  ?  ?Additional Comments: information gathered from chart. no family at bedside. MD reports she will be reaching out to family ?  ? ?  ?Prior Functioning/Environment Prior Level of Function : Patient poor historian/Family not available ?  ?  ?  ?  ?  ?  ?Mobility Comments: patient initially unsure, but then reports she walks using a rolling walker ?ADLs Comments: poor historian, unsure if family assists. family likely provides at least set-up or supervision due to baseline dementia ?  ? ?  ?  ?OT Problem List: Decreased strength;Decreased activity tolerance;Impaired balance (sitting and/or standing);Decreased safety awareness ?  ?   ?OT Treatment/Interventions: Self-care/ADL training;Therapeutic exercise;Energy conservation;DME and/or AE instruction;Therapeutic activities;Patient/family education;Balance training  ?  ?OT Goals(Current goals can be found in the care plan section) Acute Rehab OT Goals ?Patient Stated Goal: to go home ?OT Goal Formulation: With patient ?Time For Goal Achievement: 01/26/22 ?Potential to Achieve Goals: Fair ?ADL Goals ?Pt Will Perform Grooming: with modified independence;standing ?Pt Will Perform Lower Body  Dressing: with modified independence;sit to/from stand ?Pt Will Transfer to Toilet: with modified independence;ambulating;regular height toilet  ?OT Frequency: Min 2X/week ?  ? ?Co-evaluation PT/OT/SLP Co-Evaluation/Treatment: Yes ?Reason for Co-Treatment: Necessary to address cognition/behavior during functional activity ?PT goals addressed during session: Mobility/safety with mobility ?OT goals addressed during session: ADL's and self-care ?  ? ?  ?AM-PAC OT "6 Clicks" Daily Activity     ?Outcome Measure Help from another person eating meals?: A Little ?Help from another person taking care of personal grooming?: A Little ?Help from another person toileting, which includes using toliet, bedpan, or urinal?: A Lot ?Help from another person bathing (including washing, rinsing, drying)?: A Lot ?Help from another person to put on and taking off regular upper body clothing?: A Little ?Help from another person to put on and taking off regular lower body clothing?: A Little ?6 Click Score: 16 ?  ?End of Session Nurse Communication: Mobility status ? ?Activity Tolerance: Patient tolerated treatment well ?Patient left: in bed;with call bell/phone within reach;with nursing/sitter in room ? ?OT Visit Diagnosis: Unsteadiness on feet (R26.81)  ?              ?Time:  9476-5465 ?OT Time Calculation (min): 27 min ?Charges:  OT General Charges ?$OT Visit: 1 Visit ?OT Evaluation ?$OT Eval Moderate Complexity: 1 Mod ?OT Treatments ?$Self Care/Home Management : 8-22 mins ? ?Dessie Coma, M.S. OTR/L  ?01/12/22, 1:04 PM  ?ascom [phone] ? ?

## 2022-01-12 NOTE — Progress Notes (Signed)
?Progress Note ? ? ?Patient: Adriana Martin PRF:[id number] DOB: [birth year] DOA: 01/09/2022     2 ?DOS: the patient was seen and examined on 01/12/2022 ?  ?Brief hospital course: ?Ms. Maniah Nading is a 76 year old female with history of dementia, hyperlipidemia, hypertension, COPD, hypothyroid, atrial flutter on anticoagulation, history of malignant neoplasm of the right breast, ER positive, history of CVA, CKD stage IIIb, who presented to the ED on 01/09/2022 for evaluation after having a syncopal episode. ? ?Initial BP was 86/43 which improved with bolused IV fluids.   ?Otherwise vitals were stable in the ED. ?Initial labs notable for Cr 1.87, WBC 18.4k.  Hs-troponin 52>>39.  UA suggests possible infection with many bacteria, only trace leukocytes, 6-10 WBC's, glucosuria. ? ?Imaging - CXR negative.   ?CT of the head without contrast: "expected evolution of prior infarct within the left parietal and mildly left temporal region, now chronic encephalomalacia.  Unchanged smaller left occipital and right cerebellar remote infarcts.  No acute intracranial hemorrhage.  No acute intracranial process." ? ?Started on empiric broad spectrum antibiotics for sepsis due to probable UTI.  Admitted to the hospital for further evaluation and management. ? ?Assessment and Plan: ?* Severe sepsis with acute organ dysfunction ([place]) ?Present on admission as evidenced by leukocytosis, hypotension and acute encephalopathy consistent with organ dysfunction in the setting of likely UTI. ?Treated per sepsis protocol in ED with fluid boluses and IV antibiotics. ?-- Started on empiric vancomycin and cefepime ?-- Stopped vancomycin ?-- Transition Cefepime >> Rocephin for empiric UTI overage ?-- De-escalate antibiotics as cultures result ?-- Monitor fever curve and CBC, hemodynamics ?-- Follow cultures ? ? ?Diarrhea ?POA, resolved on its own. ?Cancelled orders for C. Diff testing and enteric precautions. ?Monitor. ? ?Laceration of left  forearm ?With sutures in place on admission. ?Laceration appears well-approximated and healing, no surrounding warmth, swelling, erythema or induration to suggest infection. ?--local wound care ?--monitor closely ? ?S/P mitral valve repair ?Noted ? ?Recurrent falls ?Fall precautions ?PT and OT evaluations ?SNF recommended ? ?ICD (implantable cardioverter-defibrillator), biventricular, in situ ?Noted ? ?GERD (gastroesophageal reflux disease) ?Continue Protonix ? ?Weakness ?Generalized, in the setting of hypotension and sepsis.  Unclear mobility at baseline. ?-- Fall precautions ?--PT and OT evaluations ? ?Cardiac arrest [facility]) ?History of V. tach cardiac arrest in 04/10/2021, patient received CPR and ACLS protocol, was intubated and transferred to ICU care.  V. tach was presumed secondary to hypokalemia as her ejection fraction was normal.  Patient also received defibrillation. She was started on amiodarone gtt. and transition to p.o. amiodarone prior to discharge ?- Per med reconciliation, patient is no longer on amiodarone ? ?Dementia ([place]) ?No apparent behavioral disturbances. ?-- Delirium precautions ?--Continue home Aricept ? ?Hypothyroid ?Continue levothyroxine.  TSH within normal limits ? ?COPD (chronic obstructive pulmonary disease) ([place]) ?Not acutely exacerbated, no wheezing on exam ?Continue formulary equivalents for home maintenance inhaler/s.  As needed albuterol nebs ? ?HTN (hypertension) ?Home lisinopril held on admission due to AKI and hypotension, resume when able. ?Continue as needed hydralazine for now.  Monitor BP.  Titrate antihypertensives ? ? ?Acute kidney injury superimposed on CKD ([place]) ?Present on admission with creatinine of 1.87. ?Baseline CKD 3B, baseline Cr 1.42-1.65. ?Likely prerenal azotemia in the setting of hypotension and sepsis. ?Renal function improved with IV fluids. ? ?Creatinine today 1.46. ?--Monitor BMP ?-- Avoid nephrotoxins, hypotension, maintain MAP above 65 ?-- Renally dose  meds ?-- Monitor off IV fluids for now ? ? ?Hyperlipidemia ?Continue pravastatin ? ?Carcinoma of overlapping  sites of right breast in female, estrogen receptor positive ([place]) ?- Continue follow-up outpatient ? ?A-fib ([place]) ?History of A-fib/flutter.  Currently rate controlled. ?Continue Eliquis. ?Appears not on rate control agent outpatient. ?IV Lopressor as needed ? ?Anxiety ?Continue home Effexor ?Ativan as needed for anxiety or seizure activity ? ?Anemia ?Possibly delusional.  Initial hemoglobin was 12.1, possibly hemoconcentration. ?Monitor CBC for now. ? ?Atrial flutter ([place]) ?See A-fib ? ? ? ? ?  ? ?Subjective: Pt seated edge of bed, working with PT/OT today.  She denies feeling sick or having pain.  No acute complaints.  No acute events reported.  ? ?Physical Exam: ?Vitals:  ? 01/12/22 0827 01/12/22 1132 01/12/22 1207 01/12/22 1545  ?BP: 123/86 (!) 144/62 (!) 138/56 (!) 151/63  ?Pulse: 61 81 (!) 59 60  ?Resp: '16  16 16  '$ ?Temp: 98.1 ?F (36.7 ?C)  98.2 ?F (36.8 ?C) 98.3 ?F (36.8 ?C)  ?TempSrc:   Oral   ?SpO2: 100% 99% 99% 100%  ?Weight:      ?Height:      ? ?General exam: awake, alert, no acute distress ?HEENT: moist mucus membranes, hearing grossly normal  ?Respiratory system: CTAB, no wheezes, rales or rhonchi, normal respiratory effort. ?Cardiovascular system: normal S1/S2, RRR,no pedal edema.   ?Gastrointestinal system: soft, NT, ND ?Central nervous system: A&O xself. no gross focal neurologic deficits, normal speech ?Extremities: moves all, no edema, normal tone ?Psychiatry: normal mood, congruent affect, judgement and insight appear normal ? ? ?Data Reviewed: ? ?Notable labs:  Cr 1.17 improved, Ca 8.7, GFR 48, Hbg stable 10.6, leukocytosis resolved ? ?Family Communication: none, will attempt to call ? ?Disposition: ?Status is: Inpatient ?Remains inpatient appropriate because: remains on empiric IV antibiotic for UTI pending cultures ? ? Planned Discharge Destination: Skilled nursing facility ? ? ? ?Time  spent: 35 minutes ? ?Author: ?Ezekiel Slocumb, DO ?01/12/2022 5:33 PM ? ?For on call review [email or web address].  ?

## 2022-01-12 NOTE — Evaluation (Signed)
Physical Therapy Evaluation ?Patient Details ?Name: Adriana Martin ?MRN: [id number] ?DOB: 05/23/1946 ?Today's Date: 01/12/2022 ? ?History of Present Illness ? Patient is a 76 year old female with  history of dementia, hyperlipidemia, hypertension, COPD, hypothyroid, atrial flutter on anticoagulation, history of malignant neoplasm of the right breast, ER positive, history of CVA, CKD stage IIIb, who presented to the ED on 01/09/2022 for evaluation after having a syncopal episode. Found to have severe sepsis with acute organ dysfunction  ?Clinical Impression ? Patient oriented to self only. She was able to follow single step commands with extra time. She was ambulatory prior to admission per chart and patient reports she uses a rolling walker at baseline.  ?Patient required minimal assistance for bed mobility. She was able to stand multiple times from the bed with no physical assistance but cues required for safety. She was able to ambulate in the room with rolling walker with occasional assistance for rolling walker negotiation and cues for safety. Vitals monitored throughout session and no dizziness is reported with activity. Recommend to continue PT to maximize independence and facilitate return to prior level of function. If patient has adequate family support/supervision, she could discharge home with HHPT and family assistance. Otherwise, consider SNF placement.  ?   ? ?Recommendations for follow up therapy are one component of a multi-disciplinary discharge planning process, led by the attending physician.  Recommendations may be updated based on patient status, additional functional criteria and insurance authorization. ? ?Follow Up Recommendations Skilled nursing-short term rehab (<3 hours/day) ? ?  ?Assistance Recommended at Discharge Intermittent Supervision/Assistance  ?Patient can return home with the following ? A little help with walking and/or transfers;A little help with  bathing/dressing/bathroom;Help with stairs or ramp for entrance;Assist for transportation;Direct supervision/assist for financial management;Direct supervision/assist for medications management ? ?  ?Equipment Recommendations None recommended by PT  ?Recommendations for Other Services ?    ?  ?Functional Status Assessment Patient has had a recent decline in their functional status and demonstrates the ability to make significant improvements in function in a reasonable and predictable amount of time.  ? ?  ?Precautions / Restrictions Precautions ?Precautions: Fall ?Restrictions ?Weight Bearing Restrictions: No  ? ?  ? ?Mobility ? Bed Mobility ?Overal bed mobility: Needs Assistance ?Bed Mobility: Supine to Sit, Sit to Supine ?  ?  ?Supine to sit: Min assist ?Sit to supine: Min assist ?  ?General bed mobility comments: patient required assistance for LE support. she needs cues for sequencing and task initiation ?  ? ?Transfers ?Overall transfer level: Needs assistance ?Equipment used: Rolling walker (2 wheels) ?Transfers: Sit to/from Stand ?Sit to Stand: Min guard ?  ?  ?  ?  ?  ?General transfer comment: no lifting assistance required. Min guard for safety. multiple standing bouts performed from bed ?  ? ?Ambulation/Gait ?Ambulation/Gait assistance: Min guard, Min assist ?Gait Distance (Feet): 25 Feet ?Assistive device: Rolling walker (2 wheels) ?Gait Pattern/deviations: Step-through pattern, Decreased stride length ?Gait velocity: decreased ?  ?  ?General Gait Details: patient ambulated in the room using rolling walker. occasional cues for safety and technique using rolling walker with occasional assistance for negotiation for turns using rolling walker. patient fatigued with activity ? ?Stairs ?  ?  ?  ?  ?  ? ?Wheelchair Mobility ?  ? ?Modified Rankin (Stroke Patients Only) ?  ? ?  ? ?Balance Overall balance assessment: Needs assistance ?Sitting-balance support: Feet supported ?Sitting balance-Leahy Scale: Good ?   ?  ?Standing balance  support: Bilateral upper extremity supported ?Standing balance-Leahy Scale: Fair ?Standing balance comment: Min guard for safety ?  ?  ?  ?  ?  ?  ?  ?  ?  ?  ?  ?   ? ? ? ?Pertinent Vitals/Pain Pain Assessment ?Pain Assessment: No/denies pain  ? ? ?Home Living Family/patient expects to be discharged to:: Private residence ?Living Arrangements: Children ?Available Help at Discharge: Available PRN/intermittently;Family ?Type of Home: Mobile home ?Home Access: Ramped entrance ?  ?  ?  ?Home Layout: One level ?  ?Additional Comments: information gathered from chart. no family at bedside. MD reports she will be reaching out to family  ?  ?Prior Function Prior Level of Function : Patient poor historian/Family not available ?  ?  ?  ?  ?  ?  ?Mobility Comments: patient initially unsure, but then reports she walks using a rolling walker ?ADLs Comments: poor historian, unsure if family assists. family likely provides at least set-up or supervision due to baseline dementia ?  ? ? ?Hand Dominance  ?   ? ?  ?Extremity/Trunk Assessment  ? Upper Extremity Assessment ?Upper Extremity Assessment: Defer to OT evaluation ?  ? ?Lower Extremity Assessment ?Lower Extremity Assessment: Generalized weakness ?  ? ?   ?Communication  ? Communication: No difficulties  ?Cognition Arousal/Alertness: Awake/alert ?Behavior During Therapy: [facility] for tasks assessed/performed ?Overall Cognitive Status: History of cognitive impairments - at baseline ?  ?  ?  ?  ?  ?  ?  ?  ?  ?  ?  ?  ?  ?  ?  ?  ?General Comments: patient is oriented to person only. she is able to follow single step commands with increased time ?  ?  ? ?  ?General Comments General comments (skin integrity, edema, etc.): vitals monitored throughout session. no reported dizziness with activity. blood pressure supine 144/62, initial sitting 86/47, sitting for 3 minutes 154/67, standing 153/69. Sp02 91% while ambulating on room air, and 94% at rest on room air ? ?   ?Exercises    ? ?Assessment/Plan  ?  ?PT Assessment Patient needs continued PT services  ?PT Problem List Decreased strength;Decreased activity tolerance;Decreased mobility;Decreased balance;Decreased cognition;Decreased safety awareness ? ?   ?  ?PT Treatment Interventions DME instruction;Gait training;Functional mobility training;Therapeutic activities;Therapeutic exercise;Balance training;Neuromuscular re-education;Cognitive remediation;Patient/family education   ? ?PT Goals (Current goals can be found in the Care Plan section)  ?Acute Rehab PT Goals ?Patient Stated Goal: patient unable to state ?PT Goal Formulation: Patient unable to participate in goal setting ?Time For Goal Achievement: 01/26/22 ?Potential to Achieve Goals: Fair ? ?  ?Frequency Min 2X/week ?  ? ? ?Co-evaluation PT/OT/SLP Co-Evaluation/Treatment: Yes ?Reason for Co-Treatment: Necessary to address cognition/behavior during functional activity ?PT goals addressed during session: Mobility/safety with mobility ?OT goals addressed during session: ADL's and self-care ?  ? ? ?  ?AM-PAC PT "6 Clicks" Mobility  ?Outcome Measure Help needed turning from your back to your side while in a flat bed without using bedrails?: None ?Help needed moving from lying on your back to sitting on the side of a flat bed without using bedrails?: A Little ?Help needed moving to and from a bed to a chair (including a wheelchair)?: A Little ?Help needed standing up from a chair using your arms (e.g., wheelchair or bedside chair)?: A Little ?Help needed to walk in hospital room?: A Little ?Help needed climbing 3-5 steps with a railing? : A Lot ?6 Click Score: 18 ? ?  ?  End of Session   ?Activity Tolerance: Patient tolerated treatment well ?Patient left: in bed;with call bell/phone within reach;with bed alarm set ?Nurse Communication: Mobility status ?PT Visit Diagnosis: Unsteadiness on feet (R26.81);Muscle weakness (generalized) (M62.81) ?  ? ?Time: 8416-6063 ?PT Time  Calculation (min) (ACUTE ONLY): 29 min ? ? ?Charges:   PT Evaluation ?$PT Eval Low Complexity: 1 Low ?PT Treatments ?$Gait Training: 8-22 mins ?  ?   ? ? ?Minna Merritts, PT, MPT ? ? ?Percell Locus ?01/12/2022, 12:54 PM ? ?

## 2022-01-13 DIAGNOSIS — R6520 Severe sepsis without septic shock: Secondary | ICD-10-CM | POA: Diagnosis not present

## 2022-01-13 DIAGNOSIS — A419 Sepsis, unspecified organism: Secondary | ICD-10-CM | POA: Diagnosis not present

## 2022-01-13 LAB — BASIC METABOLIC PANEL
Anion gap: 7 (ref 5–15)
BUN: 21 mg/dL (ref 8–23)
CO2: 27 mmol/L (ref 22–32)
Calcium: 8.7 mg/dL — ABNORMAL LOW (ref 8.9–10.3)
Chloride: 102 mmol/L (ref 98–111)
Creatinine, Ser: 1.09 mg/dL — ABNORMAL HIGH (ref 0.44–1.00)
GFR, Estimated: 53 mL/min — ABNORMAL LOW (ref >60)
Glucose, Bld: 89 mg/dL (ref 70–99)
Potassium: 3.6 mmol/L (ref 3.5–5.1)
Sodium: 136 mmol/L (ref 135–145)

## 2022-01-13 LAB — URINE CULTURE: Culture: >=100000 — AB

## 2022-01-13 MED ORDER — CEFAZOLIN SODIUM-DEXTROSE 1-4 GM/50ML-% IV SOLN
1.0000 g | Freq: Three times a day (TID) | INTRAVENOUS | Status: AC
  Administered 2022-01-14: 1 g via INTRAVENOUS
  Filled 2022-01-13 (×2): qty 50

## 2022-01-13 NOTE — Progress Notes (Signed)
Occupational Therapy Treatment ?Patient Details ?Name: Adriana Martin ?MRN: [id number] ?DOB: November 28, 1945 ?Today's Date: 01/13/2022 ? ? ?History of present illness Patient is a 76 year old female with  history of dementia, hyperlipidemia, hypertension, COPD, hypothyroid, atrial flutter on anticoagulation, history of malignant neoplasm of the right breast, ER positive, history of CVA, CKD stage IIIb, who presented to the ED on 01/09/2022 for evaluation after having a syncopal episode. Found to have severe sepsis with acute organ dysfunction ?  ?OT comments ? Pt sitting up in chair upon OT arrival, daughter present but leaving upon start of OT session.  Pt in agreement for ambulation to bathroom to get cleaned up d/t BI sitting up in chair.  Sp02 at 96% on 2L 02 with pt resting in chair.  Doffed O2 and walked from recliner to bathroom for toileting.  Cues for PLB d/t mild dyspnea with activity, but still at 90% after several min on room air.  Desat to 84% after 5 min.  Donned 2L 02 with quick return to 95%.  Pt sat on commode while RN and OT assisted with clean up and clothing change.  Pt tolerated standing with min guard for 50% of peri care.  Pt required min A to maneuver walker through bathroom doorway and around bed.  Mod A supine to sit and max A for scooting toward HOB.  OT replaced purewic, set bed alarm and placed all necessary items within reach.  Pt will continue to benefit from skilled OT in the acute setting to work towards OT goals in [place].   ? ?Recommendations for follow up therapy are one component of a multi-disciplinary discharge planning process, led by the attending physician.  Recommendations may be updated based on patient status, additional functional criteria and insurance authorization. ?   ?Follow Up Recommendations ? Skilled nursing-short term rehab (<3 hours/day)  ?  ?Assistance Recommended at Discharge Frequent or constant Supervision/Assistance  ?Patient can return home with the following ?  A lot of help with bathing/dressing/bathroom;Help with stairs or ramp for entrance;A little help with walking and/or transfers ?  ?Equipment Recommendations ? BSC/3in1  ?  ?Recommendations for Other Services   ? ?  ?Precautions / Restrictions Precautions ?Precautions: Fall ?Restrictions ?Weight Bearing Restrictions: No  ? ? ?  ? ?Mobility Bed Mobility ?Overal bed mobility: Needs Assistance ?Bed Mobility: Sit to Supine ?  ?  ?  ?Sit to supine: Mod assist ?  ?General bed mobility comments: Max A to scoot toward HOB once in supine. ?Patient Response: Cooperative ? ?Transfers ?Overall transfer level: Needs assistance ?Equipment used: Rolling walker (2 wheels) ?Transfers: Sit to/from Stand ?Sit to Stand: Min guard ?  ?  ?  ?  ?  ?General transfer comment: cues for hand placement to push from seated surface ?  ?  ?Balance Overall balance assessment: Needs assistance ?Sitting-balance support: Feet supported ?Sitting balance-Leahy Scale: Fair ?  ?  ?Standing balance support: Bilateral upper extremity supported ?Standing balance-Leahy Scale: Fair ?Standing balance comment: Min guard for safety ?  ?  ?  ?  ?  ?  ?  ?  ?  ?  ?  ?   ? ?ADL either performed or assessed with clinical judgement  ? ?ADL Overall ADL's : Needs assistance/impaired ?  ?  ?  ?  ?  ?  ?  ?  ?Upper Body Dressing : Moderate assistance;Sitting ?Upper Body Dressing Details (indicate cue type and reason): to don clean hospital gown ?Lower Body Dressing: Maximal  assistance;Sitting/lateral leans ?Lower Body Dressing Details (indicate cue type and reason): max A d/t dyspnea and fatigue ?Toilet Transfer: Minimal assistance;BSC/3in1;Rolling walker (2 wheels) ?Toilet Transfer Details (indicate cue type and reason): 3in1 placed over toilet ?  ?  ?Tub/ Shower Transfer: Total assistance ?Tub/Shower Transfer Details (indicate cue type and reason): pt had BI in chair and UI on the way to the bathroom.  Total assist for toilet hygiene, but pt able to tolerate standing  during pericare. ?Functional mobility during ADLs: Minimal assistance;Rolling walker (2 wheels) ?General ADL Comments: Sp02 at 96% on 2L upon OT arrival, pt resting in chair.  Doffed O2 and walked from recliner to bathroom for toileting on room air.  Cues for PLB d/t mild dyspnea with activity, but still at 90% after several min on room air.  Desat to 84% after 5 min.  Donned 2L 02 with quick return to 95%. ?  ? ?Extremity/Trunk Assessment Upper Extremity Assessment ?Upper Extremity Assessment: Generalized weakness ?  ?Lower Extremity Assessment ?Lower Extremity Assessment: Generalized weakness ?  ?  ?  ? ?Vision Patient Visual Report: No change from baseline ?  ?  ?Perception   ?  ?Praxis   ?  ? ?Cognition Arousal/Alertness: Awake/alert ?Behavior During Therapy: [facility] for tasks assessed/performed ?Overall Cognitive Status: History of cognitive impairments - at baseline ?  ?  ?  ?  ?  ?  ?  ?  ?  ?  ?  ?  ?  ?  ?  ?  ?General Comments: patient able to follow single step commands with extra time. she is oriented to person ?  ?  ?   ?   ? ?  ?   ? ? ?  ?General Comments    ? ? ?Pertinent Vitals/ Pain       Pain Assessment ?Pain Assessment: Faces ?Faces Pain Scale: Hurts little more ?Pain Location: bilat feet ?Pain Descriptors / Indicators: Tender ?Pain Intervention(s): Monitored during session, Limited activity within patient's tolerance, Other (comment) (daughter reported pt has hx of cellulitis and pt had been wincing when feet were touched.  Notified nursing and nursing reported she would notify MD.) ? ?   ?  ?  ?  ?  ?  ?  ?  ?  ?  ?  ?  ?  ?  ?  ?  ?  ?  ?  ? ?  ?    ?  ?  ?  ?   ? ?Frequency ? Min 2X/week  ? ? ? ? ?  ?Progress Toward Goals ? ?OT Goals(current goals can now be found in the care plan section) ? Progress towards OT goals: Progressing toward goals ? ?Acute Rehab OT Goals ?Patient Stated Goal: to go home ?OT Goal Formulation: With patient ?Time For Goal Achievement: 01/26/22 ?Potential to Achieve  Goals: Fair  ?Plan Discharge plan remains appropriate   ? ?Co-evaluation ? ? ?   ?  ?  ?  ?  ? ?  ?AM-PAC OT "6 Clicks" Daily Activity     ?Outcome Measure ? ? Help from another person eating meals?: A Little ?Help from another person taking care of personal grooming?: A Little ?Help from another person toileting, which includes using toliet, bedpan, or urinal?: A Lot ?Help from another person bathing (including washing, rinsing, drying)?: A Lot ?Help from another person to put on and taking off regular upper body clothing?: A Little ?Help from another person to put on and taking off regular  lower body clothing?: A Lot ?6 Click Score: 15 ? ?  ?End of Session Equipment Utilized During Treatment: Gait belt;Rolling walker (2 wheels) ? ?OT Visit Diagnosis: Unsteadiness on feet (R26.81) ?  ?Activity Tolerance Patient tolerated treatment well ?  ?Patient Left in bed;with call bell/phone within reach;with bed alarm set ?  ?Nurse Communication Mobility status ?  ? ?   ? ?Time: 9499-7182 ?OT Time Calculation (min): 35 min ? ?Charges: OT General Charges ?$OT Visit: 1 Visit ?OT Treatments ?$Self Care/Home Management : 23-37 mins ? ?Leta Speller, MS, OTR/L ? ? ?Darleene Cleaver ?01/13/2022, 4:51 PM ?

## 2022-01-13 NOTE — [place] FL2 (Signed)
?[place] MEDICAID FL2 LEVEL OF CARE SCREENING TOOL  ?  ? ?IDENTIFICATION  ?Patient Name: ?Adriana Martin Birthdate: 1945/12/23 Sex: female Admission Date (Current Location): ?01/09/2022  ?[name] and [place] Number: ? [place] ?  Facility and Address:  ?[facility], [address] [place], [place] [address] ?     Provider Number: ?[id number]  ?Attending Physician Name and Address:  ?Ezekiel Slocumb, DO ? Relative Name and Phone Number:  ?[name] [phone] ?   ?Current Level of Care: ?Hospital Recommended Level of Care: ?[place] Prior Approval Number: ?  ? ?Date Approved/Denied: ?  PASRR Number: ?  ? ?Discharge Plan: ?SNF ?  ? ?Current Diagnoses: ?Patient Active Problem List  ? Diagnosis Date Noted  ? Diarrhea 01/12/2022  ? Severe sepsis ([place]) 01/10/2022  ? Laceration of left forearm 01/10/2022  ? Severe sepsis with acute organ dysfunction ([place]) 01/09/2022  ? Weakness   ? Acute respiratory failure with hypoxia ([place])   ? Cardiac arrest [facility])   ? Epigastric pain   ? CHF exacerbation ([place]) 04/19/2021  ? Colitis 04/05/2021  ? Acute colitis 04/04/2021  ? Acute kidney injury superimposed on CKD ([place]) 04/04/2021  ? HTN (hypertension) 04/04/2021  ? Atrial fibrillation, chronic ([place]) 04/04/2021  ? Chronic diastolic CHF (congestive heart failure) ([place]) 04/04/2021  ? Rectal bleeding 04/04/2021  ? COPD (chronic obstructive pulmonary disease) ([place]) 04/04/2021  ? HLD (hyperlipidemia) 04/04/2021  ? Hypothyroid 04/04/2021  ? Cirrhosis ([place]) 04/04/2021  ? Breast cancer ([place]) 04/04/2021  ? Dementia ([place]) 04/04/2021  ? History of radiation therapy 04/02/2021  ? Secondary adrenal insufficiency ([place]) 10/30/2020  ? Senile purpura ([place]) 10/30/2020  ? Acquired thrombophilia ([place]) 06/13/2020  ? Essential hypertension 03/14/2020  ? Hyperlipidemia 03/14/2020  ? Acute ischemic stroke ([place]) L MCA s/p mechanical thrombectomy, embolic d/t AF not on [facility] 03/10/2020  ? History of stroke 03/10/2020   ? Osteopenia 01/21/2020  ? Family history of breast cancer   ? Family history of multiple myeloma   ? Family history of esophageal cancer   ? Displaced comminuted fracture of shaft of right humerus 12/05/2018  ? Recurrent falls 12/05/2018  ? 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/2018  ? MGUS (monoclonal gammopathy of unknown significance) 12/14/2017  ? Imbalance 06/24/2017  ? Tremor 06/24/2017  ? ICD (implantable cardioverter-defibrillator), biventricular, 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  ? Carcinoma of overlapping sites of right breast in female, estrogen receptor positive ([place]) 06/16/2016  ? Acute exacerbation of CHF (congestive heart failure) ([place]) 06/07/2016  ? A-fib ([place]) 04/08/2016  ? COPD exacerbation ([place]) 03/22/2016  ? Acute bronchitis 03/22/2016  ? Atrial fibrillation with RVR ([place]) 03/22/2016  ? Chronic renal insufficiency 03/22/2016  ? 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/30/2016  ? Cough with expectoration 06/04/2015  ? Essential thrombocytosis ([place]) 02/01/2015  ? Anemia 02/01/2015  ? Atrial flutter ([place]) 01/23/2015  ? Atrial fibrillation ([place]) 01/23/2015  ? Other long term (current) drug therapy 11/06/2014  ? High risk medication use 11/06/2014  ? 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 Chronic diastolic heart failure ([place]) 08/01/2014  ? Severe mitral insufficiency 08/01/2014  ? Long term current use of anticoagulant 06/06/2014  ? S/P mitral valve repair 06/06/2014  ? Anxiety 05/13/2014  ? Chronic obstructive pulmonary disease ([place]) 03/22/2014  ? Moderate episode of recurrent major depressive disorder ([place]) 03/22/2014  ? 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 Avitaminosis D 02/19/2014  ? GERD (gastroesophageal reflux disease) 02/19/2014  ? ? ?Orientation RESPIRATION BLADDER Height & Weight   ?  ?  Self ? Normal, O2 (2 liters) Continent, External catheter Weight: 79.6 kg ?Height:  _0  (157.5 cm)  ?BEHAVIORAL SYMPTOMS/MOOD NEUROLOGICAL BOWEL NUTRITION STATUS  ?    Continent Diet  ?AMBULATORY  STATUS COMMUNICATION OF NEEDS Skin   ?Extensive Assist Verbally Normal ?  ?  ?  ?    ?     ?     ? ? ?Personal Care Assistance Level of Assistance  ?Bathing, Feeding, Dressing Bathing Assistance: Maximum assistance ?Feeding assistance: Limited assistance ?Dressing Assistance: Maximum assistance ?   ? ?Functional Limitations Info  ?    ?  ?   ? ? ?SPECIAL CARE FACTORS FREQUENCY  ?PT (By licensed PT), OT (By licensed OT)   ?  ?PT Frequency: 5 times per week ?OT Frequency: 5 times per week ?  ?  ?  ?   ? ? ?Contractures Contractures Info: Not present  ? ? ?Additional Factors Info  ?Code Status, Allergies Code Status Info: DNR ?Allergies Info: Atorvastatin, Calcium, Fluoxetine, Hydrochlorothiazide ?  ?  ?  ?   ? ?Current Medications (01/13/2022):  This is the current hospital active medication list ?Current Facility-Administered Medications  ?Medication Dose Route Frequency Provider Last Rate Last Admin  ? acetaminophen (TYLENOL) tablet 650 mg  650 mg Oral Q6H PRN Cox, Amy N, DO      ? Or  ? acetaminophen (TYLENOL) suppository 650 mg  650 mg Rectal Q6H PRN Cox, Amy N, DO      ? albuterol (PROVENTIL) (2.5 MG/3ML) 0.083% nebulizer solution 3 mL  3 mL Inhalation Q4H PRN Cox, Amy N, DO      ? apixaban (ELIQUIS) tablet 5 mg  5 mg Oral BID Cox, Amy N, DO   5 mg at 01/13/22 8115  ? B-complex with vitamin C tablet 1 tablet  1 tablet Oral Daily Cox, Amy N, DO   1 tablet at 01/13/22 0841  ? cefTRIAXone (ROCEPHIN) 1 g in sodium chloride 0.9 % 100 mL IVPB  1 g Intravenous Q24H Nicole Kindred A, DO 200 mL/hr at 01/13/22 0846 1 g at 01/13/22 0846  ? Chlorhexidine Gluconate Cloth 2 % PADS 6 each  6 each Topical Q0600 Ezekiel Slocumb, DO   6 each at 01/12/22 7262  ? donepezil (ARICEPT) tablet 5 mg  5 mg Oral QHS Cox, Amy N, DO   5 mg at 01/12/22 2149  ? ezetimibe (ZETIA) tablet 10 mg  10 mg Oral Daily Cox, Amy N, DO   10 mg at 01/13/22 0841  ? hydrALAZINE (APRESOLINE) tablet 10 mg  10 mg Oral Q6H PRN Cox, Amy N, DO   10 mg at  01/11/22 0934  ? levothyroxine (SYNTHROID) tablet 50 mcg  50 mcg Oral QAC breakfast Cox, Amy N, DO   50 mcg at 01/13/22 0631  ? LORazepam (ATIVAN) injection 1 mg  1 mg Intravenous Q6H PRN Cox, Amy N, DO      ? melatonin tablet 5 mg  5 mg Oral Daily Cox, Amy N, DO   5 mg at 01/12/22 1613  ? metoprolol tartrate (LOPRESSOR) injection 5 mg  5 mg Intravenous Q2H PRN Cox, Amy N, DO      ? mometasone-formoterol (DULERA) 100-5 MCG/ACT inhaler 2 puff  2 puff Inhalation BID Cox, Amy N, DO   2 puff at 01/13/22 0355  ? multivitamin with minerals tablet 1 tablet  1 tablet Oral Daily Cox, Amy N, DO   1 tablet at 01/13/22 0841  ? mupirocin ointment (BACTROBAN)  2 % 1 application.  1 application. Nasal BID Nicole Kindred A, DO   1 application. at 01/12/22 0955  ? ondansetron (ZOFRAN) tablet 4 mg  4 mg Oral Q6H PRN Cox, Amy N, DO      ? Or  ? ondansetron (ZOFRAN) injection 4 mg  4 mg Intravenous Q6H PRN Cox, Amy N, DO      ? pantoprazole (PROTONIX) EC tablet 40 mg  40 mg Oral BID Cox, Amy N, DO   40 mg at 01/13/22 5520  ? pravastatin (PRAVACHOL) tablet 40 mg  40 mg Oral QHS Cox, Amy N, DO   40 mg at 01/12/22 2149  ? predniSONE (DELTASONE) tablet 5 mg  5 mg Oral Q breakfast Cox, Amy N, DO   5 mg at 01/13/22 0841  ? ursodiol (ACTIGALL) capsule 600 mg  600 mg Oral BID Cox, Amy N, DO   600 mg at 01/12/22 2149  ? venlafaxine XR (EFFEXOR-XR) 24 hr capsule 75 mg  75 mg Oral Daily Cox, Amy N, DO   75 mg at 01/13/22 0841  ? Vitamin D (Ergocalciferol) (DRISDOL) capsule 50,000 Units  50,000 Units Oral Q14 Days Cox, Amy N, DO   50,000 Units at 01/11/22 8022  ? ? ? ?Discharge Medications: ?Please see discharge summary for a list of discharge medications. ? ?Relevant Imaging Results: ? ?Relevant Lab Results: ? ? ?Additional Information ?SSN: [id number] ? ?Conception Oms, RN ? ? ? ? ?

## 2022-01-13 NOTE — TOC Progression Note (Signed)
Transition of Care (TOC) - Progression Note  ? ? ?Patient Details  ?Name: Adriana Martin ?MRN: [id number] ?Date of Birth: Mar 20, 1946 ? ?Transition of Care (TOC) CM/SW Contact  ?Conception Oms, RN ?Phone Number: ?01/13/2022, 9:32 AM ? ?Clinical Narrative:    ? ?Attempted to reach the patient's daughter [name] by phone, unable to reach, left a general VM asking for a call back ?Bedsearch completed, Fl2 completed, will review bed offers once available ? ?Expected Discharge Plan: [place] ?Barriers to Discharge: Continued Medical Work up ? ?Expected Discharge Plan and Services ?Expected Discharge Plan: [place] ?  ?Discharge Planning Services: CM Consult ?  ?Living arrangements for the past 2 months: Mobile Home ?                ?DME Arranged: N/A ?  ?  ?  ?  ?  ?  ?  ?  ?  ? ? ?Social Determinants of Health (SDOH) Interventions ?  ? ?Readmission Risk Interventions ? ?  04/06/2021  ? 10:09 AM  ?Readmission Risk Prevention Plan  ?Transportation Screening Complete  ?PCP or Specialist Appt within 3-5 Days Complete  ?Lupus or Home Care Consult Complete  ?Social Work Consult for [place] Planning/Counseling Complete  ?Palliative Care Screening Complete  ?Medication Review [employer]) Complete  ? ? ?

## 2022-01-13 NOTE — Progress Notes (Signed)
Physical Therapy Treatment ?Patient Details ?Name: Adriana Martin ?MRN: [id number] ?DOB: 09-24-1945 ?Today's Date: 01/13/2022 ? ? ?History of Present Illness Patient is a 76 year old female with  history of dementia, hyperlipidemia, hypertension, COPD, hypothyroid, atrial flutter on anticoagulation, history of malignant neoplasm of the right breast, ER positive, history of CVA, CKD stage IIIb, who presented to the ED on 01/09/2022 for evaluation after having a syncopal episode. Found to have severe sepsis with acute organ dysfunction ? ?  ?PT Comments  ? ? Patient sitting up in chair on arrival to room. She is agreeable to PT. Patient continues to require assistance with functional mobility. Four standing bouts performed from chair with Min A- Mod A. Standing tolerance limited to less than a minute and patient declined ambulation today due to fatigue. Recommend to continue PT to maximize independence. SNF is still appropriate discharge recommendation at this time.  ?   ?Recommendations for follow up therapy are one component of a multi-disciplinary discharge planning process, led by the attending physician.  Recommendations may be updated based on patient status, additional functional criteria and insurance authorization. ? ?Follow Up Recommendations ? Skilled nursing-short term rehab (<3 hours/day) ?  ?  ?Assistance Recommended at Discharge Intermittent Supervision/Assistance  ?Patient can return home with the following A little help with walking and/or transfers;A little help with bathing/dressing/bathroom;Help with stairs or ramp for entrance;Assist for transportation;Direct supervision/assist for financial management;Direct supervision/assist for medications management ?  ?Equipment Recommendations ? None recommended by PT  ?  ?Recommendations for Other Services   ? ? ?  ?Precautions / Restrictions Precautions ?Precautions: Fall ?Restrictions ?Weight Bearing Restrictions: No  ?  ? ?Mobility ? Bed Mobility ?  ?   ?  ?  ?  ?  ?  ?General bed mobility comments: not addressed as patient sitting up on arrival and post session ?  ? ?Transfers ?Overall transfer level: Needs assistance ?Equipment used: Rolling walker (2 wheels) ?Transfers: Sit to/from Stand ?Sit to Stand: Mod assist, Min assist ?  ?  ?  ?  ?  ?General transfer comment: 4 standing bouts performed from recliner chair. verbal cues required with hand placement with limited carry over demonstrated with repeat bouts of standing ?  ? ?Ambulation/Gait ?  ?  ?  ?  ?  ?  ?  ?General Gait Details: limited standing tolerance this session and patient fatigued with minimal activity. she declined attempting to ambulate at this time ? ? ?Stairs ?  ?  ?  ?  ?  ? ? ?Wheelchair Mobility ?  ? ?Modified Rankin (Stroke Patients Only) ?  ? ? ?  ?Balance Overall balance assessment: Needs assistance ?Sitting-balance support: Feet supported ?Sitting balance-Leahy Scale: Fair ?  ?  ?  ?  ?  ?  ?  ?  ?  ?  ?  ?  ?  ?  ?  ?  ?  ? ?  ?Cognition Arousal/Alertness: Awake/alert ?Behavior During Therapy: [facility] for tasks assessed/performed ?Overall Cognitive Status: History of cognitive impairments - at baseline ?  ?  ?  ?  ?  ?  ?  ?  ?  ?  ?  ?  ?  ?  ?  ?  ?General Comments: patient able to follow single step commands with extra time. she is oriented to person ?  ?  ? ?  ?Exercises General Exercises - Lower Extremity ?Ankle Circles/Pumps: AROM, Strengthening, 10 reps, Seated ?Long Arc Quad: AAROM, Strengthening, Both, 10 reps,  Seated ?Hip Flexion/Marching: AAROM, Strengthening, Both, 10 reps, Seated ?Other Exercises ?Other Exercises: verbal and visual cues for exercise technique for strengthening ? ?  ?General Comments   ?  ?  ? ?Pertinent Vitals/Pain Pain Assessment ?Pain Assessment: No/denies pain  ? ? ?Home Living   ?  ?  ?  ?  ?  ?  ?  ?  ?  ?   ?  ?Prior Function    ?  ?  ?   ? ?PT Goals (current goals can now be found in the care plan section) Acute Rehab PT Goals ?Patient Stated Goal: to  return home ?PT Goal Formulation: With patient ?Time For Goal Achievement: 01/26/22 ?Potential to Achieve Goals: Fair ?Progress towards PT goals: Progressing toward goals ? ?  ?Frequency ? ? ? Min 2X/week ? ? ? ?  ?PT Plan Current plan remains appropriate  ? ? ?Co-evaluation   ?  ?  ?  ?  ? ?  ?AM-PAC PT "6 Clicks" Mobility   ?Outcome Measure ? Help needed turning from your back to your side while in a flat bed without using bedrails?: A Little ?Help needed moving from lying on your back to sitting on the side of a flat bed without using bedrails?: A Little ?Help needed moving to and from a bed to a chair (including a wheelchair)?: A Lot ?Help needed standing up from a chair using your arms (e.g., wheelchair or bedside chair)?: A Lot ?Help needed to walk in hospital room?: A Little ?Help needed climbing 3-5 steps with a railing? : A Lot ?6 Click Score: 15 ? ?  ?End of Session Equipment Utilized During Treatment: Gait belt ?Activity Tolerance: Patient tolerated treatment well ?Patient left: in chair;with call bell/phone within reach;with chair alarm set ?Nurse Communication: Mobility status ?PT Visit Diagnosis: Unsteadiness on feet (R26.81);Muscle weakness (generalized) (M62.81) ?  ? ? ?Time: 6283-1517 ?PT Time Calculation (min) (ACUTE ONLY): 24 min ? ?Charges:  $Therapeutic Exercise: 8-22 mins ?$Therapeutic Activity: 8-22 mins          ?          ? ?Minna Merritts, PT, MPT ? ? ?Percell Locus ?01/13/2022, 1:17 PM ? ?

## 2022-01-13 NOTE — Plan of Care (Signed)
?  Problem: Education: ?Goal: Knowledge of General Education information will improve ?Description: Including pain rating scale, medication(s)/side effects and non-pharmacologic comfort measures ?01/13/2022 2349 by Odette Horns, RN ?Outcome: Progressing ?01/13/2022 2348 by Odette Horns, RN ?Outcome: Progressing ?  ?Problem: Health Behavior/Discharge Planning: ?Goal: Ability to manage health-related needs will improve ?01/13/2022 2349 by Odette Horns, RN ?Outcome: Progressing ?01/13/2022 2348 by Odette Horns, RN ?Outcome: Progressing ?  ?Problem: Clinical Measurements: ?Goal: Ability to maintain clinical measurements within normal limits will improve ?01/13/2022 2349 by Odette Horns, RN ?Outcome: Progressing ?01/13/2022 2348 by Odette Horns, RN ?Outcome: Progressing ?Goal: Will remain free from infection ?01/13/2022 2349 by Odette Horns, RN ?Outcome: Progressing ?01/13/2022 2348 by Odette Horns, RN ?Outcome: Progressing ?Goal: Diagnostic test results will improve ?01/13/2022 2349 by Odette Horns, RN ?Outcome: Progressing ?01/13/2022 2348 by Odette Horns, RN ?Outcome: Progressing ?Goal: Respiratory complications will improve ?01/13/2022 2349 by Odette Horns, RN ?Outcome: Progressing ?01/13/2022 2348 by Odette Horns, RN ?Outcome: Progressing ?Goal: Cardiovascular complication will be avoided ?01/13/2022 2349 by Odette Horns, RN ?Outcome: Progressing ?01/13/2022 2348 by Odette Horns, RN ?Outcome: Progressing ?  ?Problem: Activity: ?Goal: Risk for activity intolerance will decrease ?01/13/2022 2349 by Odette Horns, RN ?Outcome: Progressing ?01/13/2022 2348 by Odette Horns, RN ?Outcome: Progressing ?  ?Problem: Nutrition: ?Goal: Adequate nutrition will be maintained ?01/13/2022 2349 by Odette Horns, RN ?Outcome: Progressing ?01/13/2022 2348 by Odette Horns, RN ?Outcome: Progressing ?  ?Problem: Coping: ?Goal: Level of anxiety will decrease ?01/13/2022 2349 by Odette Horns, RN ?Outcome:  Progressing ?01/13/2022 2348 by Odette Horns, RN ?Outcome: Progressing ?  ?Problem: Elimination: ?Goal: Will not experience complications related to bowel motility ?01/13/2022 2349 by Odette Horns, RN ?Outcome: Progressing ?01/13/2022 2348 by Odette Horns, RN ?Outcome: Progressing ?Goal: Will not experience complications related to urinary retention ?01/13/2022 2349 by Odette Horns, RN ?Outcome: Progressing ?01/13/2022 2348 by Odette Horns, RN ?Outcome: Progressing ?  ?Problem: Pain Managment: ?Goal: General experience of comfort will improve ?01/13/2022 2349 by Odette Horns, RN ?Outcome: Progressing ?01/13/2022 2348 by Odette Horns, RN ?Outcome: Progressing ?  ?Problem: Safety: ?Goal: Ability to remain free from injury will improve ?01/13/2022 2349 by Odette Horns, RN ?Outcome: Progressing ?01/13/2022 2348 by Odette Horns, RN ?Outcome: Progressing ?  ?Problem: Skin Integrity: ?Goal: Risk for impaired skin integrity will decrease ?01/13/2022 2349 by Odette Horns, RN ?Outcome: Progressing ?01/13/2022 2348 by Odette Horns, RN ?Outcome: Progressing ?  ?

## 2022-01-13 NOTE — Progress Notes (Addendum)
?Progress Note ? ? ?Patient: Adriana Martin DVV:[id number] DOB: December 09, 1945 DOA: 01/09/2022     3 ?DOS: the patient was seen and examined on 01/13/2022 ?  ?Brief hospital course: ?[name] is a 76 year old female with history of dementia, hyperlipidemia, hypertension, COPD, hypothyroid, atrial flutter on anticoagulation, history of malignant neoplasm of the right breast, ER positive, history of CVA, CKD stage IIIb, who presented to the ED on 01/09/2022 for evaluation after having a syncopal episode. ? ?Initial BP was 86/43 which improved with bolused IV fluids.   ?Otherwise vitals were stable in the ED. ?Initial labs notable for Cr 1.87, WBC 18.4k.  Hs-troponin 52>>39.  UA suggests possible infection with many bacteria, only trace leukocytes, 6-10 WBC's, glucosuria. ? ?Imaging - CXR negative.   ?CT of the head without contrast: "expected evolution of prior infarct within the left parietal and mildly left temporal region, now chronic encephalomalacia.  Unchanged smaller left occipital and right cerebellar remote infarcts.  No acute intracranial hemorrhage.  No acute intracranial process." ? ?Started on empiric broad spectrum antibiotics for sepsis due to probable UTI.  Admitted to the hospital for further evaluation and management. ? ?Assessment and Plan: ?* Severe sepsis with acute organ dysfunction ([place]) ?Present on admission as evidenced by leukocytosis, hypotension and acute encephalopathy consistent with organ dysfunction in the setting of likely UTI. ?Treated per sepsis protocol in ED with fluid boluses and IV antibiotics. ?-- Started on empiric vancomycin and cefepime ?-- Stopped vancomycin ?-- Transition Cefepime >> Rocephin for empiric UTI overage ?-- De-escalate antibiotics as cultures result ?-- Monitor fever curve and CBC, hemodynamics ?-- Follow cultures ? ? ?Diarrhea ?POA, resolved on its own. ?Cancelled orders for C. Diff testing and enteric precautions. ?Monitor. ? ?Laceration of left  forearm ?With sutures in place on admission. ?Laceration appears well-approximated and healing, no surrounding warmth, swelling, erythema or induration to suggest infection. ?--local wound care ?--monitor closely ? ?S/P mitral valve repair ?Noted ? ?Recurrent falls ?Fall precautions ?PT and OT evaluations ?SNF recommended ? ?ICD (implantable cardioverter-defibrillator), biventricular, in situ ?Noted ? ?GERD (gastroesophageal reflux disease) ?Continue Protonix ? ?Weakness ?Generalized, in the setting of hypotension and sepsis.  Unclear mobility at baseline. ?-- Fall precautions ?--PT and OT evaluations ? ?Cardiac arrest [facility]) ?History of V. tach cardiac arrest in 04/10/2021, patient received CPR and ACLS protocol, was intubated and transferred to ICU care.  V. tach was presumed secondary to hypokalemia as her ejection fraction was normal.  Patient also received defibrillation. She was started on amiodarone gtt. and transition to p.o. amiodarone prior to discharge ?- Per med reconciliation, patient is no longer on amiodarone ? ?Dementia ([place]) ?No apparent behavioral disturbances. ?-- Delirium precautions ?--Continue home Aricept ? ?Hypothyroid ?Continue levothyroxine.  TSH within normal limits ? ?COPD (chronic obstructive pulmonary disease) ([place]) ?Not acutely exacerbated, no wheezing on exam ?Continue formulary equivalents for home maintenance inhaler/s.  As needed albuterol nebs ? ?HTN (hypertension) ?Home lisinopril held on admission due to AKI and hypotension, resume when able. ?Continue as needed hydralazine for now.  Monitor BP.  Titrate antihypertensives ? ? ?Acute kidney injury superimposed on CKD ([place]) ?Present on admission with creatinine of 1.87. ?Baseline CKD 3B, baseline Cr 1.42-1.65. ?Likely prerenal azotemia in the setting of hypotension and sepsis. ?Renal function improved with IV fluids. ? ?Creatinine today 1.09. ?--Monitor BMP ?-- Avoid nephrotoxins, hypotension, maintain MAP above 65 ?-- Renally dose  meds ?-- Monitor off IV fluids for now ? ? ?Hyperlipidemia ?Continue pravastatin ? ?Carcinoma of overlapping  sites of right breast in female, estrogen receptor positive ([facility]) ?- Continue follow-up outpatient ? ?A-fib ([place]) ?History of A-fib/flutter.  Currently rate controlled. ?Continue Eliquis. ?Appears not on rate control agent outpatient. ?IV Lopressor as needed ? ?Anxiety ?Continue home Effexor ?Ativan as needed for anxiety or seizure activity ? ?Anemia ?Possibly delusional.  Initial hemoglobin was 12.1, possibly hemoconcentration. ?Monitor CBC for now. ? ?Atrial flutter ([place]) ?See A-fib ? ? ? ? ?  ? ?Subjective: Pt seen sitting edge of bed today.  Reports feeling okay, denies pain or feeling sick.  No acute events reported. ? ?Physical Exam: ?Vitals:  ? 01/12/22 1545 01/12/22 2019 01/13/22 0435 01/13/22 1722  ?BP: (!) 151/63 134/65 (!) 168/68 116/61  ?Pulse: 60 (!) 59 64 (!) 109  ?Resp: '16 18 20 16  '$ ?Temp: 98.3 ?F (36.8 ?C) 99.1 ?F (37.3 ?C) 97.8 ?F (36.6 ?C) 97.7 ?F (36.5 ?C)  ?TempSrc:      ?SpO2: 100% 98% 100% 100%  ?Weight:      ?Height:      ? ?General exam: Seated edge of bed working with therapy awake, alert, no acute distress ?HEENT: moist mucus membranes, hearing grossly normal  ?Respiratory system: CTAB, no wheezes, rales or rhonchi, normal respiratory effort. ?Cardiovascular system: normal S1/S2, RRR,no pedal edema.   ?Central nervous system: A&O xself. no gross focal neurologic deficits, normal speech ?Extremities: moves all, no edema, normal tone ?Psychiatry: normal mood, congruent affect ? ? ?Data Reviewed: ? ?Notable labs:  Creatinine improved 1.09, calcium 8.7, GFR 53 ? ?Family Communication: none, will attempt to call ? ?Disposition: ?Status is: Inpatient ?Remains inpatient appropriate because: Requires SNF placement which is pending, bed offers today and TOC attempting to reach family to discuss ? ? Planned Discharge Destination: Skilled nursing facility ? ? ? ?Time spent: 35  minutes ? ?Author: ?Ezekiel Slocumb, DO ?01/13/2022 7:02 PM ? ?For on call review [email or web address]  ?

## 2022-01-14 DIAGNOSIS — R6520 Severe sepsis without septic shock: Secondary | ICD-10-CM | POA: Diagnosis not present

## 2022-01-14 DIAGNOSIS — A419 Sepsis, unspecified organism: Secondary | ICD-10-CM | POA: Diagnosis not present

## 2022-01-14 LAB — CULTURE, BLOOD (ROUTINE X 2)
Culture: NO GROWTH
Culture: NO GROWTH
Special Requests: ADEQUATE

## 2022-01-14 NOTE — TOC Progression Note (Signed)
Transition of Care (TOC) - Progression Note  ? ? ?Patient Details  ?Name: Jarvis Knodel ?MRN: [id number] ?Date of Birth: 04-10-1946 ? ?Transition of Care (TOC) CM/SW Contact  ?Conception Oms, RN ?Phone Number: ?01/14/2022, 9:55 AM ? ?Clinical Narrative:    ? ?Spoke with Daughter [name] and reviewed  bed offers ?She stated that she would call [name] the Primary caregiver and discuss and have [name] call me back, I explained that it is short term and depending on her progress would indicate how long she would stay ? ?Expected Discharge Plan: [place] ?Barriers to Discharge: Continued Medical Work up ? ?Expected Discharge Plan and Services ?Expected Discharge Plan: [place] ?  ?Discharge Planning Services: CM Consult ?  ?Living arrangements for the past 2 months: Mobile Home ?                ?DME Arranged: N/A ?  ?  ?  ?  ?  ?  ?  ?  ?  ? ? ?Social Determinants of Health (SDOH) Interventions ?  ? ?Readmission Risk Interventions ? ?  04/06/2021  ? 10:09 AM  ?Readmission Risk Prevention Plan  ?Transportation Screening Complete  ?PCP or Specialist Appt within 3-5 Days Complete  ?[place] or Home Care Consult Complete  ?Social Work Consult for [place] Planning/Counseling Complete  ?Palliative Care Screening Complete  ?Medication Review Press photographer) Complete  ? ? ?

## 2022-01-14 NOTE — Discharge Summary (Signed)
?Physician Discharge Summary ?  ?Patient: Adriana Martin MRN: [id number] DOB: 1946/02/17  ?Admit date:     01/09/2022  ?Discharge date: 01/14/22  ?Discharge Physician: Emeterio Reeve  ? ?PCP: Sallee Lange, NP  ? ?Recommendations at discharge:  ? Follow up with PCP in 1-2 weeks, consider repeat UA/UCx ? ?Discharge Diagnoses: ?Principal Problem: ?  Severe sepsis with acute organ dysfunction ([place]) ?Active Problems: ?  Atrial flutter ([place]) ?  Anemia ?  Anxiety ?  A-fib ([place]) ?  Carcinoma of overlapping sites of right breast in female, estrogen receptor positive ([place]) ?  Hyperlipidemia ?  Acute kidney injury superimposed on CKD ([place]) ?  HTN (hypertension) ?  COPD (chronic obstructive pulmonary disease) ([place]) ?  Hypothyroid ?  Dementia ([place]) ?  Cardiac arrest [facility]) ?  Weakness ?  GERD (gastroesophageal reflux disease) ?  ICD (implantable cardioverter-defibrillator), biventricular, in situ ?  Recurrent falls ?  S/P mitral valve repair ?  Severe sepsis ([place]) ?  Laceration of left forearm ?  Diarrhea ? ?Resolved Problems: ?  * No resolved hospital problems. * ? ?Hospital Course: ?[name] is a 76 year old female with history of dementia, hyperlipidemia, hypertension, COPD, hypothyroid, atrial flutter on anticoagulation, history of malignant neoplasm of the right breast, ER positive, history of CVA, CKD stage IIIb, who presented to the ED on 01/09/2022 for evaluation after having a syncopal episode. Initial BP was 86/43 which improved with bolused IV fluids. Otherwise vitals were stable in the ED. Initial labs notable for Cr 1.87, WBC 18.4k.  Hs-troponin 52>>39.  UA suggests possible infection with many bacteria, only trace leukocytes, 6-10 WBC's, glucosuria. Imaging - CXR negative.   ?CT of the head without contrast: "expected evolution of prior infarct within the left parietal and mildly left temporal region, now chronic encephalomalacia.  Unchanged smaller left occipital and right cerebellar remote  infarcts.  No acute intracranial hemorrhage.  No acute intracranial process." Started on empiric broad spectrum antibiotics for sepsis due to probable UTI.  Admitted to the hospital for further evaluation and management. Antibiotics were able to be deescalated and treatment completed inpatient. Pt continued to improve to baseline, was initially going to be d/c to SNF and this was in process, but family later decided to do home health instead, this was arranged and everythign was ready for patient discharge 01/14/22  ? ?Assessment and Plan: ?* Severe sepsis with acute organ dysfunction ([place]) ?Present on admission as evidenced by leukocytosis, hypotension and acute encephalopathy consistent with organ dysfunction in the setting of likely UTI. ?Treated per sepsis protocol in ED with fluid boluses and IV antibiotics. Started on empiric vancomycin and cefepime- Stopped vancomycin-- Transition Cefepime >> Rocephin for empiric UTI overage. ABX were able to be deescalated to cefazolin, pt completed 5 days Rx ? ? ?Diarrhea ?POA, resolved on its own. ?Cancelled orders for C. Diff testing and enteric precautions. ?Monitor. ? ?Laceration of left forearm ?With sutures in place on admission. ?Laceration appears well-approximated and healing, no surrounding warmth, swelling, erythema or induration to suggest infection. ?--local wound care ?--monitor closely ? ?S/P mitral valve repair ?Noted ? ?Recurrent falls ?Fall precautions ?PT and OT evaluations ?SNF recommended ? ?ICD (implantable cardioverter-defibrillator), biventricular, in situ ?Noted ? ?GERD (gastroesophageal reflux disease) ?Continue Protonix ? ?Weakness ?Generalized, in the setting of hypotension and sepsis.  Unclear mobility at baseline. ?-- Fall precautions ?--PT and OT evaluations ? ?Cardiac arrest [facility]) ?History of V. tach cardiac arrest in 04/10/2021, patient received CPR and ACLS protocol, was  intubated and transferred to ICU care.  V. tach was presumed secondary  to hypokalemia as her ejection fraction was normal.  Patient also received defibrillation. She was started on amiodarone gtt. and transition to p.o. amiodarone prior to discharge ?- Per med reconciliation, patient is no longer on amiodarone ? ?Dementia ([place]) ?No apparent behavioral disturbances. ?-- Delirium precautions ?--Continue home Aricept ? ?Hypothyroid ?Continue levothyroxine.  TSH within normal limits ? ?COPD (chronic obstructive pulmonary disease) ([place]) ?Not acutely exacerbated, no wheezing on exam ?Continue formulary equivalents for home maintenance inhaler/s.  As needed albuterol nebs ? ?HTN (hypertension) ?Home lisinopril held on admission due to AKI and hypotension, resume when able. ?Continue as needed hydralazine for now.  Monitor BP.  Titrate antihypertensives ? ? ?Acute kidney injury superimposed on CKD ([place]) ?Present on admission with creatinine of 1.87. ?Baseline CKD 3B, baseline Cr 1.42-1.65. ?Likely prerenal azotemia in the setting of hypotension and sepsis. ?Renal function improved with IV fluids. ? ? ?Hyperlipidemia ?Continue pravastatin ? ?Carcinoma of overlapping sites of right breast in female, estrogen receptor positive ([place]) ?- Continue follow-up outpatient ? ?A-fib ([place]) ?History of A-fib/flutter.  Currently rate controlled. ?Continue Eliquis. ?Appears not on rate control agent outpatient. ?IV Lopressor as needed ? ?Anxiety ?Continue home Effexor ?Ativan as needed for anxiety or seizure activity ? ?Anemia ?Possibly delusional.  Initial hemoglobin was 12.1, possibly hemoconcentration. ?Monitor CBC for now. ? ?Atrial flutter ([place]) ?See A-fib ? ? ? ? ?  ? ? ?Consultants: none ?Procedures performed: none  ?Disposition: Home health ?Diet recommendation:  ?Discharge Diet Orders (From admission, onward)  ? ?  Start     Ordered  ? 01/14/22 0000  Diet - low sodium heart healthy       ? 01/14/22 1436  ? ?  ?  ? ?  ? ?Cardiac and Carb modified diet ?DISCHARGE MEDICATION: ?Allergies as of 01/14/2022    ? ?   Reactions  ? Atorvastatin Shortness Of Breath  ? Tolerates pravastatin  ? Calcium Shortness Of Breath  ? Fluoxetine   ? Other reaction(s): Unknown  ? Hydrochlorothiazide   ? Other reaction(s): Unknown  ? ?  ? ?  ?Medication List  ?  ? ?STOP taking these medications   ? ?acetaminophen 500 MG tablet ?Commonly known as: TYLENOL ?  ?amiodarone 200 MG tablet ?Commonly known as: PACERONE ?  ?Cholecalciferol 1.25 MG (50000 UT) capsule ?  ?potassium chloride SA 20 MEQ tablet ?Commonly known as: KLOR-CON M ?  ? ?  ? ?TAKE these medications   ? ?Advair Diskus 100-50 MCG/ACT Aepb ?Generic drug: fluticasone-salmeterol ?Inhale 1 puff into the lungs 2 (two) times daily. ?  ?B-complex with vitamin C tablet ?Take 1 tablet by mouth daily. ?  ?donepezil 5 MG tablet ?Commonly known as: ARICEPT ?Take 5 mg by mouth at bedtime. ?  ?Eliquis 5 MG Tabs tablet ?Generic drug: apixaban ?Take 5 mg by mouth 2 (two) times daily. ?  ?empagliflozin 10 MG Tabs tablet ?Commonly known as: Jardiance ?Take 1 tablet (10 mg total) by mouth daily before breakfast. ?  ?ezetimibe 10 MG tablet ?Commonly known as: ZETIA ?Take 10 mg by mouth daily. ?  ?furosemide 40 MG tablet ?Commonly known as: LASIX ?Take 1 tablet (40 mg total) by mouth daily. ?  ?ipratropium-albuterol 0.5-2.5 (3) MG/3ML Soln ?Commonly known as: DUONEB ?Take 3 mLs by nebulization 3 (three) times daily. ?  ?levothyroxine 50 MCG tablet ?Commonly known as: SYNTHROID ?Take 50 mcg by mouth daily before breakfast. ?  ?lisinopril 5  MG tablet ?Commonly known as: ZESTRIL ?TAKE (1) TABLET BY MOUTH EVERY DAY TO PROTECT KIDNEYS ?  ?melatonin 5 MG Tabs ?Take 5 mg by mouth daily. Given at 5pm ?  ?multivitamin tablet ?Take 1 tablet by mouth daily. ?  ?pantoprazole 40 MG tablet ?Commonly known as: PROTONIX ?Take 1 tablet (40 mg total) by mouth 2 (two) times daily. ?  ?polyethylene glycol 17 g packet ?Commonly known as: MIRALAX / GLYCOLAX ?Take 17 g by mouth daily as needed for mild constipation. ?   ?potassium chloride 10 MEQ tablet ?Commonly known as: KLOR-CON ?Take 10 mEq by mouth 2 (two) times daily. ?  ?pravastatin 40 MG tablet ?Commonly known as: PRAVACHOL ?Take 40 mg by mouth at bedtime. ?

## 2022-01-14 NOTE — TOC Progression Note (Signed)
Transition of Care (TOC) - Progression Note  ? ? ?Patient Details  ?Name: Althea Backs ?MRN: [id number] ?Date of Birth: November 19, 1945 ? ?Transition of Care (TOC) CM/SW Contact  ?Conception Oms, RN ?Phone Number: ?01/14/2022, 2:43 PM ? ?Clinical Narrative:    ? ?Called [name] to let her know DC orders in, She will come pick up the patient ? ?Expected Discharge Plan: [place] ?Barriers to Discharge: Continued Medical Work up ? ?Expected Discharge Plan and Services ?Expected Discharge Plan: [place] ?  ?Discharge Planning Services: CM Consult ?  ?Living arrangements for the past 2 months: Mobile Home ?Expected Discharge Date: 01/14/22               ?DME Arranged: N/A ?  ?  ?  ?  ?  ?  ?  ?  ?  ? ? ?Social Determinants of Health (SDOH) Interventions ?  ? ?Readmission Risk Interventions ? ?  01/14/2022  ? 10:57 AM 04/06/2021  ? 10:09 AM  ?Readmission Risk Prevention Plan  ?Transportation Screening Complete Complete  ?PCP or Specialist Appt within 3-5 Days  Complete  ?[place] or Home Care Consult  Complete  ?Social Work Consult for [place] Planning/Counseling  Complete  ?Palliative Care Screening  Complete  ?Medication Review Press photographer) Complete Complete  ?[place] or Home Care Consult Complete   ?SW Recovery Care/Counseling Consult Complete   ?Palliative Care Screening Not Applicable   ?[place] Not Applicable   ? ? ?

## 2022-01-14 NOTE — TOC Progression Note (Signed)
Transition of Care (TOC) - Progression Note  ? ? ?Patient Details  ?Name: Libertie Hausler ?MRN: [id number] ?Date of Birth: 02-14-1946 ? ?Transition of Care (TOC) CM/SW Contact  ?Conception Oms, RN ?Phone Number: ?01/14/2022, 10:10 AM ? ?Clinical Narrative:    ? ?[name] called and stated that they do not want to do STR SNF, they prefer to do home health, I explained that [facility] does not  come daily, she stated that the patient has been in Rehab before and they DO NOT want to do it again, She stated she has no preference for [facility] agencies, ?[facility] will accept the patient for [facility] ? ?Expected Discharge Plan: [place] ?Barriers to Discharge: Continued Medical Work up ? ?Expected Discharge Plan and Services ?Expected Discharge Plan: [place] ?  ?Discharge Planning Services: CM Consult ?  ?Living arrangements for the past 2 months: Mobile Home ?                ?DME Arranged: N/A ?  ?  ?  ?  ?  ?  ?  ?  ?  ? ? ?Social Determinants of Health (SDOH) Interventions ?  ? ?Readmission Risk Interventions ? ?  04/06/2021  ? 10:09 AM  ?Readmission Risk Prevention Plan  ?Transportation Screening Complete  ?PCP or Specialist Appt within 3-5 Days Complete  ?[place] or Home Care Consult Complete  ?Social Work Consult for [place] Planning/Counseling Complete  ?Palliative Care Screening Complete  ?Medication Review Press photographer) Complete  ? ? ?

## 2022-01-16 DIAGNOSIS — R6520 Severe sepsis without septic shock: Secondary | ICD-10-CM | POA: Diagnosis not present

## 2022-01-16 DIAGNOSIS — E039 Hypothyroidism, unspecified: Secondary | ICD-10-CM | POA: Diagnosis not present

## 2022-01-16 DIAGNOSIS — I4891 Unspecified atrial fibrillation: Secondary | ICD-10-CM | POA: Diagnosis not present

## 2022-01-16 DIAGNOSIS — I4892 Unspecified atrial flutter: Secondary | ICD-10-CM | POA: Diagnosis not present

## 2022-01-16 DIAGNOSIS — F0283 Dementia in other diseases classified elsewhere, unspecified severity, with mood disturbance: Secondary | ICD-10-CM | POA: Diagnosis not present

## 2022-01-16 DIAGNOSIS — S51812D Laceration without foreign body of left forearm, subsequent encounter: Secondary | ICD-10-CM | POA: Diagnosis not present

## 2022-01-16 DIAGNOSIS — J449 Chronic obstructive pulmonary disease, unspecified: Secondary | ICD-10-CM | POA: Diagnosis not present

## 2022-01-16 DIAGNOSIS — F0284 Dementia in other diseases classified elsewhere, unspecified severity, with anxiety: Secondary | ICD-10-CM | POA: Diagnosis not present

## 2022-01-16 DIAGNOSIS — D631 Anemia in chronic kidney disease: Secondary | ICD-10-CM | POA: Diagnosis not present

## 2022-01-16 DIAGNOSIS — I5032 Chronic diastolic (congestive) heart failure: Secondary | ICD-10-CM | POA: Diagnosis not present

## 2022-01-16 DIAGNOSIS — K219 Gastro-esophageal reflux disease without esophagitis: Secondary | ICD-10-CM | POA: Diagnosis not present

## 2022-01-16 DIAGNOSIS — A419 Sepsis, unspecified organism: Secondary | ICD-10-CM | POA: Diagnosis not present

## 2022-01-16 DIAGNOSIS — N179 Acute kidney failure, unspecified: Secondary | ICD-10-CM | POA: Diagnosis not present

## 2022-01-16 DIAGNOSIS — N1832 Chronic kidney disease, stage 3b: Secondary | ICD-10-CM | POA: Diagnosis not present

## 2022-01-16 DIAGNOSIS — E785 Hyperlipidemia, unspecified: Secondary | ICD-10-CM | POA: Diagnosis not present

## 2022-01-16 DIAGNOSIS — I130 Hypertensive heart and chronic kidney disease with heart failure and stage 1 through stage 4 chronic kidney disease, or unspecified chronic kidney disease: Secondary | ICD-10-CM | POA: Diagnosis not present

## 2022-01-19 DIAGNOSIS — Z9229 Personal history of other drug therapy: Secondary | ICD-10-CM | POA: Diagnosis not present

## 2022-01-19 DIAGNOSIS — Z09 Encounter for follow-up examination after completed treatment for conditions other than malignant neoplasm: Secondary | ICD-10-CM | POA: Diagnosis not present

## 2022-01-19 DIAGNOSIS — Z7901 Long term (current) use of anticoagulants: Secondary | ICD-10-CM | POA: Diagnosis not present

## 2022-01-19 DIAGNOSIS — Z87440 Personal history of urinary (tract) infections: Secondary | ICD-10-CM | POA: Diagnosis not present

## 2022-01-22 ENCOUNTER — Ambulatory Visit
Admission: RE | Admit: 2022-01-22 | Discharge: 2022-01-22 | Disposition: A | Discharge status: Home / Self Care | Payer: Medicare Other | Source: Ambulatory Visit | Attending: Oncology | Admitting: Oncology

## 2022-01-22 DIAGNOSIS — Z780 Asymptomatic menopausal state: Secondary | ICD-10-CM | POA: Diagnosis not present

## 2022-01-22 DIAGNOSIS — M810 Age-related osteoporosis without current pathological fracture: Secondary | ICD-10-CM | POA: Diagnosis not present

## 2022-01-22 DIAGNOSIS — C50911 Malignant neoplasm of unspecified site of right female breast: Secondary | ICD-10-CM | POA: Insufficient documentation

## 2022-01-22 DIAGNOSIS — Z13820 Encounter for screening for osteoporosis: Secondary | ICD-10-CM | POA: Insufficient documentation

## 2022-01-22 DIAGNOSIS — M85851 Other specified disorders of bone density and structure, right thigh: Secondary | ICD-10-CM | POA: Diagnosis not present

## 2022-02-04 ENCOUNTER — Inpatient Hospital Stay (HOSPITAL_BASED_OUTPATIENT_CLINIC_OR_DEPARTMENT_OTHER): Payer: Medicare Other | Admitting: Oncology

## 2022-02-04 ENCOUNTER — Telehealth: Payer: Self-pay

## 2022-02-04 ENCOUNTER — Encounter: Payer: Self-pay | Admitting: Oncology

## 2022-02-04 ENCOUNTER — Inpatient Hospital Stay: Payer: Medicare Other | Attending: Oncology

## 2022-02-04 ENCOUNTER — Telehealth: Payer: Self-pay | Admitting: *Deleted

## 2022-02-04 VITALS — BP 92/49 | HR 62 | Temp 96.3°F | Resp 20 | Wt 176.2 lb

## 2022-02-04 DIAGNOSIS — Z08 Encounter for follow-up examination after completed treatment for malignant neoplasm: Secondary | ICD-10-CM | POA: Diagnosis not present

## 2022-02-04 DIAGNOSIS — N179 Acute kidney failure, unspecified: Secondary | ICD-10-CM

## 2022-02-04 DIAGNOSIS — Z170 Estrogen receptor positive status [ER+]: Secondary | ICD-10-CM | POA: Diagnosis not present

## 2022-02-04 DIAGNOSIS — Z5181 Encounter for therapeutic drug level monitoring: Secondary | ICD-10-CM

## 2022-02-04 DIAGNOSIS — C50911 Malignant neoplasm of unspecified site of right female breast: Secondary | ICD-10-CM | POA: Diagnosis not present

## 2022-02-04 DIAGNOSIS — D472 Monoclonal gammopathy: Secondary | ICD-10-CM | POA: Diagnosis not present

## 2022-02-04 DIAGNOSIS — M810 Age-related osteoporosis without current pathological fracture: Secondary | ICD-10-CM

## 2022-02-04 DIAGNOSIS — Z79810 Long term (current) use of selective estrogen receptor modulators (SERMs): Secondary | ICD-10-CM

## 2022-02-04 DIAGNOSIS — Z79899 Other long term (current) drug therapy: Secondary | ICD-10-CM | POA: Insufficient documentation

## 2022-02-04 DIAGNOSIS — Z923 Personal history of irradiation: Secondary | ICD-10-CM | POA: Insufficient documentation

## 2022-02-04 DIAGNOSIS — Z853 Personal history of malignant neoplasm of breast: Secondary | ICD-10-CM

## 2022-02-04 LAB — COMPREHENSIVE METABOLIC PANEL
ALT: 14 U/L (ref 0–44)
AST: 27 U/L (ref 15–41)
Albumin: 3.3 g/dL — ABNORMAL LOW (ref 3.5–5.0)
Alkaline Phosphatase: 67 U/L (ref 38–126)
Anion gap: 10 (ref 5–15)
BUN: 30 mg/dL — ABNORMAL HIGH (ref 8–23)
CO2: 29 mmol/L (ref 22–32)
Calcium: 9 mg/dL (ref 8.9–10.3)
Chloride: 98 mmol/L (ref 98–111)
Creatinine, Ser: 1.83 mg/dL — ABNORMAL HIGH (ref 0.44–1.00)
GFR, Estimated: 28 mL/min — ABNORMAL LOW (ref >60)
Glucose, Bld: 138 mg/dL — ABNORMAL HIGH (ref 70–99)
Potassium: 3.7 mmol/L (ref 3.5–5.1)
Sodium: 137 mmol/L (ref 135–145)
Total Bilirubin: 0.7 mg/dL (ref 0.3–1.2)
Total Protein: 7.4 g/dL (ref 6.5–8.1)

## 2022-02-04 LAB — CBC WITH DIFFERENTIAL/PLATELET
Abs Immature Granulocytes: 0.08 10*3/uL — ABNORMAL HIGH (ref 0.00–0.07)
Basophils Absolute: 0.1 10*3/uL (ref 0.0–0.1)
Basophils Relative: 1 %
Eosinophils Absolute: 0.2 10*3/uL (ref 0.0–0.5)
Eosinophils Relative: 1 %
HCT: 40.1 % (ref 36.0–46.0)
Hemoglobin: 12.7 g/dL (ref 12.0–15.0)
Immature Granulocytes: 1 %
Lymphocytes Relative: 7 %
Lymphs Abs: 1.1 10*3/uL (ref 0.7–4.0)
MCH: 29 pg (ref 26.0–34.0)
MCHC: 31.7 g/dL (ref 30.0–36.0)
MCV: 91.6 fL (ref 80.0–100.0)
Monocytes Absolute: 0.9 10*3/uL (ref 0.1–1.0)
Monocytes Relative: 6 %
Neutro Abs: 11.9 10*3/uL — ABNORMAL HIGH (ref 1.7–7.7)
Neutrophils Relative %: 84 %
Platelets: 388 10*3/uL (ref 150–400)
RBC: 4.38 MIL/uL (ref 3.87–5.11)
RDW: 13.8 % (ref 11.5–15.5)
WBC: 14.3 10*3/uL — ABNORMAL HIGH (ref 4.0–10.5)
nRBC: 0 % (ref 0.0–0.2)

## 2022-02-04 NOTE — Progress Notes (Signed)
Pt states  she is not feeling well at all; getting out and about today she has been dragging per granddaughter pt was fine until she had to come here.  ?

## 2022-02-04 NOTE — Progress Notes (Signed)
? ? ? ?Hematology/Oncology Consult note ?[facility]  ?[phone] [phone] ? ?Patient Care Team: ?Gauger, Victoriano Lain, NP as PCP - General (Internal Medicine) ?Robert Bellow, MD as Consulting Physician (General Surgery) ?Noreene Filbert, MD as Radiation Oncologist (Radiation Oncology)  ? ?Name of the patient: Adriana Martin  ?[id number]  ?01-14-1946  ? ?Date of visit: 02/04/22 ? ?Diagnosis- history of recurrent breast cancer ?JAK2 positive thrombocytosis ?MGUS ? ?Chief complaint/ Reason for visit-routine follow-up of above issues ? ?Heme/Onc history: Patient is a 76 year old female with history of stage I right breast cancer in May 2012 [id number] for which she had surgery radiation treatment and 5 years of Arimidex completing in 2017.  She did not have biopsy-proven invasive mammary carcinoma with lobular features in October 2020.She underwent right mastectomy on 08/09/19. Pathology revealed a 4.3 x 2.1 x 2.0 cm grade II invasive lobular carcinoma. 1 of 10 LNs positive for metastatic carcinoma. Tumor was ER/PR positive, HER2 1+, Pathologic stage mpT2 pN1a. Mammaprint was low risk. She received 3 week course of right breast radiation completed 03/08/20 and started tamoxifen in 2021.  ?  ?History of JAK2 positive thrombocytosis diagnosed in 2001 and bone marrow biopsy done back then.  Records not available.  She was on Hydrea for a while but then that was discontinued in 2017 and platelet counts have remained stable less than 400. ?  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 ? ?Interval history- Patient was admitted to the hospital last month forSepsis possibly secondary to UTI.  Today patient does not feel well.  She reports ongoing fatigue.  Oral intake is fair.  She does not verbalize much during her visit with me.  She is here with her granddaughter ? ?ECOG PS- 2 ?Pain scale- 0 ? ? ?Review of systems- Review of Systems  ?Constitutional:  Positive for  malaise/fatigue. Negative for chills, fever and weight loss.  ?HENT:  Negative for congestion, ear discharge and nosebleeds.   ?Eyes:  Negative for blurred vision.  ?Respiratory:  Negative for cough, hemoptysis, sputum production, shortness of breath and wheezing.   ?Cardiovascular:  Negative for chest pain, palpitations, orthopnea and claudication.  ?Gastrointestinal:  Negative for abdominal pain, blood in stool, constipation, diarrhea, heartburn, melena, nausea and vomiting.  ?Genitourinary:  Negative for dysuria, flank pain, frequency, hematuria and urgency.  ?Musculoskeletal:  Negative for back pain, joint pain and myalgias.  ?Skin:  Negative for rash.  ?Neurological:  Negative for dizziness, tingling, focal weakness, seizures, weakness and headaches.  ?Endo/Heme/Allergies:  Does not bruise/bleed easily.  ?Psychiatric/Behavioral:  Negative for depression and suicidal ideas. The patient does not have insomnia.    ? ? ?Allergies  ?Allergen Reactions  ? Atorvastatin Shortness Of Breath  ?  Tolerates pravastatin  ? Calcium Shortness Of Breath  ? Fluoxetine   ?  Other reaction(s): Unknown  ? Hydrochlorothiazide   ?  Other reaction(s): Unknown  ? ? ? ?Past Medical History:  ?Diagnosis Date  ? Alcohol use   ? Anemia   ? Anxiety   ? Atrial fibrillation ([place])   ? B12 deficiency   ? Breast cancer ([place])   ? RT Lumpectomy c radiation 2012  ? Breast cancer ([place])   ? CHF (congestive heart failure) ([place])   ? Chronic diastolic CHF (congestive heart failure) ([place])   ? Chronic kidney disease   ? Cirrhosis ([place])   ? CKD (chronic kidney disease), stage III ([place])   ? COPD (chronic obstructive pulmonary disease) (  [place])   ? Dementia ([place])   ? Depression   ? Family history of breast cancer   ? Family history of esophageal cancer   ? Family history of multiple myeloma   ? GERD (gastroesophageal reflux disease)   ? Headache   ? Heart murmur   ? HLD (hyperlipidemia)   ? HTN (hypertension)   ? Hyperlipemia   ? Hyperlipidemia   ?  Hypertension   ? Hypothyroid   ? Hypothyroidism   ? Iron (Fe) deficiency anemia   ? Monoclonal gammopathy   ? Pacemaker   ? Personal history of radiation therapy   ? Presence of permanent cardiac pacemaker   ? Shortness of breath dyspnea   ? Thrombocytopenia ([place])   ? Vitamin D deficiency   ? ? ? ?Past Surgical History:  ?Procedure Laterality Date  ? ABDOMINAL HYSTERECTOMY    ? Partial  ? ABLATION    ? APPENDECTOMY    ? BICEPT TENODESIS Right 01/07/2018  ? Procedure: BICEPS TENODESIS;  Surgeon: Leim Fabry, MD;  Location: [facility] ORS;  Service: Orthopedics;  Laterality: Right;  ? BREAST BIOPSY Right 2012  ? BREAST BIOPSY Right 07/21/2019  ? venus clip, [place] Bx,positive  ? BREAST LUMPECTOMY Right 2012  ? positive/rad  ? BREAST SURGERY    ? CARDIAC SURGERY    ? CARDIAC VALVE REPLACEMENT    ? COLONOSCOPY  2012  ? ELECTROPHYSIOLOGIC STUDY N/A 01/23/2015  ? Procedure: CARDIOVERSION;  Surgeon: Corey Skains, MD;  Location: [facility] OR[facility];  Service: Cardiovascular;  Laterality: N/A;  ? ELECTROPHYSIOLOGIC STUDY N/A 05/27/2016  ? Procedure: CARDIOVERSION;  Surgeon: Corey Skains, MD;  Location: [facility] OR[facility];  Service: Cardiovascular;  Laterality: N/A;  ? ESOPHAGOGASTRODUODENOSCOPY  2012  ? EYE SURGERY    ? IR CT HEAD LTD  03/10/2020  ? IR PERCUTANEOUS ART THROMBECTOMY/INFUSION INTRACRANIAL INC DIAG ANGIO  03/10/2020  ? IR US GUIDE VASC ACCESS RIGHT  03/10/2020  ? MASTECTOMY Right   ? Mastectomy Right   ? MASTECTOMY WITH AXILLARY LYMPH NODE DISSECTION Right 08/09/2019  ? Procedure: MASTECTOMY WITH AXILLARY LYMPH NODE DISSECTION;  Surgeon: Robert Bellow, MD;  Location: [facility] OR[facility];  Service: General;  Laterality: Right;  ? MITRAL VALVE REPLACEMENT    ? mvp    ? PACEMAKER PLACEMENT N/A   ? RADIOLOGY WITH ANESTHESIA N/A 03/10/2020  ? Procedure: IR WITH ANESTHESIA;  Surgeon: Radiologist, Medication, MD;  Location: [facility];  Service: Radiology;  Laterality: N/A;  ? REVERSE SHOULDER ARTHROPLASTY Right 01/07/2018  ? Procedure: REVERSE SHOULDER  ARTHROPLASTY;  Surgeon: Leim Fabry, MD;  Location: [facility];  Service: Orthopedics;  Laterality: Right;  ? VSD REPAIR    ? VSD REPAIR N/A   ? ? ?Social History  ? ?Socioeconomic History  ? Marital status: Divorced  ?  Spouse name: Not on file  ? Number of children: Not on file  ? Years of education: Not on file  ? Highest education level: Not on file  ?Occupational History  ? Not on file  ?Tobacco Use  ? Smoking status: Never  ? Smokeless tobacco: Never  ?Vaping Use  ? Vaping Use: Never used  ?Substance and Sexual Activity  ? Alcohol use: Not Currently  ?  Comment: socially  ? Drug use: Never  ? Sexual activity: Not Currently  ?Other Topics Concern  ? Not on file  ?Social History Narrative  ? ** Merged History Encounter **  ?    ? ?Social Determinants of Health  ? ?Financial  Resource Strain: Not on file  ?Food Insecurity: Not on file  ?Transportation Needs: Not on file  ?Physical Activity: Not on file  ?Stress: Not on file  ?Social Connections: Not on file  ?Intimate Partner Violence: Not on file  ? ? ?Family History  ?Problem Relation Age of Onset  ? Multiple myeloma Father   ? Esophageal cancer Brother   ? Breast cancer Mother   ?     never treated, dx. >50  ? Esophageal cancer Brother   ? Breast cancer Daughter 29  ? Cancer Daughter 17  ?     rare blood cancer  ? ? ? ?Current Outpatient Medications:  ?  ACETAMINOPHEN EXTRA STRENGTH 500 MG capsule, , Disp: , Rfl:  ?  ADVAIR DISKUS 100-50 MCG/DOSE AEPB, Inhale 1 puff into the lungs 2 (two) times daily., Disp: , Rfl:  ?  B Complex-C (B-COMPLEX WITH VITAMIN C) tablet, Take 1 tablet by mouth daily., Disp: , Rfl:  ?  donepezil (ARICEPT) 5 MG tablet, Take 5 mg by mouth at bedtime., Disp: , Rfl:  ?  ELIQUIS 5 MG TABS tablet, Take 5 mg by mouth 2 (two) times daily., Disp: , Rfl:  ?  empagliflozin (JARDIANCE) 10 MG TABS tablet, Take 1 tablet (10 mg total) by mouth daily before breakfast., Disp: 30 tablet, Rfl: 5 ?  ezetimibe (ZETIA) 10 MG tablet, Take 10 mg by mouth  daily., Disp: , Rfl:  ?  furosemide (LASIX) 40 MG tablet, Take 1 tablet (40 mg total) by mouth daily., Disp: 30 tablet, Rfl:  ?  levothyroxine (SYNTHROID) 50 MCG tablet, Take 50 mcg by mouth daily before breakfast., Disp: , Rf

## 2022-02-04 NOTE — Telephone Encounter (Signed)
Called PCP and let them know that pt has creat 1.8. we wanted to give her fluids today but pt said no. According to the granddaughter she has home health and she says they can get blood drawn met b next week when home health nurse comes.Wanted to make sure that can be done and can your office call us back and let us know if it can be done and if not then we will have to have the pt. Come back here. They will call us back per Mad[place] ?

## 2022-02-04 NOTE — Telephone Encounter (Signed)
Adriana Martin from [facility] reached back out in regards to VM left; explained that Dr. Janese Banks will like a repeat BMP next week with home health vs pt coming to facility just to have labs done. Per Adriana Martin, she will let [name] (home health nurse) know of requested lab test and it will be drawn at the next home visit. If any problem arise she will give [place] a call to let us know.  ?

## 2022-02-05 LAB — KAPPA/LAMBDA LIGHT CHAINS
Kappa free light chain: 68.7 mg/L — ABNORMAL HIGH (ref 3.3–19.4)
Kappa, lambda light chain ratio: 1.59 (ref 0.26–1.65)
Lambda free light chains: 43.1 mg/L — ABNORMAL HIGH (ref 5.7–26.3)

## 2022-02-09 LAB — MULTIPLE MYELOMA PANEL, SERUM
Albumin SerPl Elph-Mcnc: 3.1 g/dL (ref 2.9–4.4)
Albumin/Glob SerPl: 0.9 (ref 0.7–1.7)
Alpha 1: 0.4 g/dL (ref 0.0–0.4)
Alpha2 Glob SerPl Elph-Mcnc: 0.9 g/dL (ref 0.4–1.0)
B-Globulin SerPl Elph-Mcnc: 1.3 g/dL (ref 0.7–1.3)
Gamma Glob SerPl Elph-Mcnc: 1 g/dL (ref 0.4–1.8)
Globulin, Total: 3.5 g/dL (ref 2.2–3.9)
IgA: 408 mg/dL (ref 64–422)
IgG (Immunoglobin G), Serum: 1025 mg/dL (ref 586–1602)
IgM (Immunoglobulin M), Srm: 92 mg/dL (ref 26–217)
Total Protein ELP: 6.6 g/dL (ref 6.0–8.5)

## 2022-02-11 DIAGNOSIS — J449 Chronic obstructive pulmonary disease, unspecified: Secondary | ICD-10-CM | POA: Diagnosis not present

## 2022-02-11 DIAGNOSIS — A419 Sepsis, unspecified organism: Secondary | ICD-10-CM | POA: Diagnosis not present

## 2022-02-15 DIAGNOSIS — I4892 Unspecified atrial flutter: Secondary | ICD-10-CM | POA: Diagnosis not present

## 2022-02-15 DIAGNOSIS — A419 Sepsis, unspecified organism: Secondary | ICD-10-CM | POA: Diagnosis not present

## 2022-02-15 DIAGNOSIS — I130 Hypertensive heart and chronic kidney disease with heart failure and stage 1 through stage 4 chronic kidney disease, or unspecified chronic kidney disease: Secondary | ICD-10-CM | POA: Diagnosis not present

## 2022-02-15 DIAGNOSIS — D631 Anemia in chronic kidney disease: Secondary | ICD-10-CM | POA: Diagnosis not present

## 2022-02-15 DIAGNOSIS — E039 Hypothyroidism, unspecified: Secondary | ICD-10-CM | POA: Diagnosis not present

## 2022-02-15 DIAGNOSIS — I5032 Chronic diastolic (congestive) heart failure: Secondary | ICD-10-CM | POA: Diagnosis not present

## 2022-02-15 DIAGNOSIS — J449 Chronic obstructive pulmonary disease, unspecified: Secondary | ICD-10-CM | POA: Diagnosis not present

## 2022-02-15 DIAGNOSIS — N1832 Chronic kidney disease, stage 3b: Secondary | ICD-10-CM | POA: Diagnosis not present

## 2022-02-15 DIAGNOSIS — F0284 Dementia in other diseases classified elsewhere, unspecified severity, with anxiety: Secondary | ICD-10-CM | POA: Diagnosis not present

## 2022-02-15 DIAGNOSIS — E785 Hyperlipidemia, unspecified: Secondary | ICD-10-CM | POA: Diagnosis not present

## 2022-02-15 DIAGNOSIS — K219 Gastro-esophageal reflux disease without esophagitis: Secondary | ICD-10-CM | POA: Diagnosis not present

## 2022-02-15 DIAGNOSIS — F0283 Dementia in other diseases classified elsewhere, unspecified severity, with mood disturbance: Secondary | ICD-10-CM | POA: Diagnosis not present

## 2022-02-15 DIAGNOSIS — I4891 Unspecified atrial fibrillation: Secondary | ICD-10-CM | POA: Diagnosis not present

## 2022-02-15 DIAGNOSIS — N179 Acute kidney failure, unspecified: Secondary | ICD-10-CM | POA: Diagnosis not present

## 2022-02-15 DIAGNOSIS — R6520 Severe sepsis without septic shock: Secondary | ICD-10-CM | POA: Diagnosis not present

## 2022-02-15 DIAGNOSIS — S51812D Laceration without foreign body of left forearm, subsequent encounter: Secondary | ICD-10-CM | POA: Diagnosis not present

## 2022-02-18 ENCOUNTER — Encounter: Payer: Self-pay | Admitting: Nurse Practitioner

## 2022-02-26 ENCOUNTER — Telehealth: Payer: Self-pay | Admitting: *Deleted

## 2022-02-26 NOTE — Telephone Encounter (Addendum)
I had Adriana Martin called her daughter [name] and went over there fosamax vs bisphosphonates  like zometa.  She wanted to send 1 to her mother and another one to her house. I sent both of them a packet about the side effects, how often and they will get back to [place]. I forgot to document but I put them in mail on 5/26.

## 2022-02-27 DIAGNOSIS — I130 Hypertensive heart and chronic kidney disease with heart failure and stage 1 through stage 4 chronic kidney disease, or unspecified chronic kidney disease: Secondary | ICD-10-CM | POA: Diagnosis not present

## 2022-03-03 ENCOUNTER — Encounter: Payer: Self-pay | Admitting: Nurse Practitioner

## 2022-03-09 DIAGNOSIS — M79675 Pain in left toe(s): Secondary | ICD-10-CM | POA: Diagnosis not present

## 2022-03-09 DIAGNOSIS — I739 Peripheral vascular disease, unspecified: Secondary | ICD-10-CM | POA: Diagnosis not present

## 2022-03-09 DIAGNOSIS — L600 Ingrowing nail: Secondary | ICD-10-CM | POA: Diagnosis not present

## 2022-03-09 DIAGNOSIS — M79674 Pain in right toe(s): Secondary | ICD-10-CM | POA: Diagnosis not present

## 2022-03-09 DIAGNOSIS — B351 Tinea unguium: Secondary | ICD-10-CM | POA: Diagnosis not present

## 2022-03-09 DIAGNOSIS — F03911 Unspecified dementia, unspecified severity, with agitation: Secondary | ICD-10-CM | POA: Diagnosis not present

## 2022-03-14 DIAGNOSIS — J449 Chronic obstructive pulmonary disease, unspecified: Secondary | ICD-10-CM | POA: Diagnosis not present

## 2022-03-19 ENCOUNTER — Encounter (INDEPENDENT_AMBULATORY_CARE_PROVIDER_SITE_OTHER): Payer: Self-pay | Admitting: Nurse Practitioner

## 2022-03-19 ENCOUNTER — Other Ambulatory Visit (INDEPENDENT_AMBULATORY_CARE_PROVIDER_SITE_OTHER): Payer: Self-pay | Admitting: Vascular Surgery

## 2022-03-19 ENCOUNTER — Ambulatory Visit (INDEPENDENT_AMBULATORY_CARE_PROVIDER_SITE_OTHER): Payer: Medicare Other | Admitting: Nurse Practitioner

## 2022-03-19 ENCOUNTER — Ambulatory Visit (INDEPENDENT_AMBULATORY_CARE_PROVIDER_SITE_OTHER): Payer: Self-pay

## 2022-03-19 VITALS — BP 120/77 | HR 60 | Resp 16

## 2022-03-19 DIAGNOSIS — I739 Peripheral vascular disease, unspecified: Secondary | ICD-10-CM

## 2022-03-19 DIAGNOSIS — L819 Disorder of pigmentation, unspecified: Secondary | ICD-10-CM

## 2022-03-19 DIAGNOSIS — I10 Essential (primary) hypertension: Secondary | ICD-10-CM | POA: Diagnosis not present

## 2022-03-19 DIAGNOSIS — I5032 Chronic diastolic (congestive) heart failure: Secondary | ICD-10-CM

## 2022-03-31 DIAGNOSIS — G479 Sleep disorder, unspecified: Secondary | ICD-10-CM | POA: Diagnosis not present

## 2022-03-31 DIAGNOSIS — R410 Disorientation, unspecified: Secondary | ICD-10-CM | POA: Diagnosis not present

## 2022-03-31 DIAGNOSIS — R4586 Emotional lability: Secondary | ICD-10-CM | POA: Diagnosis not present

## 2022-03-31 DIAGNOSIS — R413 Other amnesia: Secondary | ICD-10-CM | POA: Diagnosis not present

## 2022-04-05 ENCOUNTER — Encounter (INDEPENDENT_AMBULATORY_CARE_PROVIDER_SITE_OTHER): Payer: Self-pay | Admitting: Nurse Practitioner

## 2022-04-05 NOTE — Progress Notes (Signed)
Subjective:    Patient ID: Adriana Martin, female    DOB: April 19, 1946, 76 y.o.   MRN: [id number] Chief Complaint  Patient presents with   New Patient (Initial Visit)    Ref Michaelle Copas consult bilateral feet red/purple,cool to touch,sensitive,sluggish cap refills    Today the patient presents as a referral from Ms. Coward, NP in regards to discoloration of her lower extremities.  It was noted that the patient began to have purple discoloration, coldness and pain in her feet after recent fall.  This happened during her hospitalization while she was being diuresed.  It is noted that the left is worse than the right.  She notes that it is more uncomfortable when her feet are dependent versus when elevated.  The patient does tend to keep her lower extremities elevated while at home.  She notes that the legs are somewhat tender.  It is noted that the discoloration is worse when her feet are dependent as well.  This has been ongoing for the last several months.  Currently there are no open wounds or ulcerations.  Today noninvasive studies show a right ABI of 1.04 with a left of 0.92.  TBI 0.68 on the right and 0.61 on the left.  The right has monophasic/biphasic waveforms with biphasic waveforms on the left.  Slightly dampened toe waveforms bilaterally.    Review of Systems  Skin:  Positive for color change.  All other systems reviewed and are negative.      Objective:   Physical Exam Vitals reviewed.  HENT:     Head: Normocephalic.  Cardiovascular:     Rate and Rhythm: Normal rate.     Pulses:          Dorsalis pedis pulses are 1+ on the right side and 1+ on the left side.  Pulmonary:     Effort: Pulmonary effort is normal.  Skin:    General: Skin is warm and dry.     Capillary Refill: Capillary refill takes 2 to 3 seconds.  Neurological:     Mental Status: She is alert and oriented to person, place, and time.  Psychiatric:        Mood and Affect: Mood normal.        Behavior:  Behavior normal.        Thought Content: Thought content normal.        Judgment: Judgment normal.     BP 120/77 (BP Location: Left Arm)   Pulse 60   Resp 16   Past Medical History:  Diagnosis Date   Alcohol use    Anemia    Anxiety    Atrial fibrillation (HCC)    B12 deficiency    Breast cancer ([place])    RT Lumpectomy c radiation 2012   Breast cancer (HCC)    CHF (congestive heart failure) (HCC)    Chronic diastolic CHF (congestive heart failure) (HCC)    Chronic kidney disease    Cirrhosis (HCC)    CKD (chronic kidney disease), stage III (HCC)    COPD (chronic obstructive pulmonary disease) (HCC)    Dementia (HCC)    Depression    Family history of breast cancer    Family history of esophageal cancer    Family history of multiple myeloma    GERD (gastroesophageal reflux disease)    Headache    Heart murmur    HLD (hyperlipidemia)    HTN (hypertension)    Hyperlipemia    Hyperlipidemia    Hypertension  Hypothyroid    Hypothyroidism    Iron (Fe) deficiency anemia    Monoclonal gammopathy    Pacemaker    Personal history of radiation therapy    Presence of permanent cardiac pacemaker    Shortness of breath dyspnea    Thrombocytopenia (HCC)    Vitamin D deficiency     Social History   Socioeconomic History   Marital status: Divorced    Spouse name: Not on file   Number of children: Not on file   Years of education: Not on file   Highest education level: Not on file  Occupational History   Not on file  Tobacco Use   Smoking status: Never   Smokeless tobacco: Never  Vaping Use   Vaping Use: Never used  Substance and Sexual Activity   Alcohol use: Not Currently    Comment: socially   Drug use: Never   Sexual activity: Not Currently  Other Topics Concern   Not on file  Social History Narrative   ** Merged History Encounter **       Social Determinants of Health   Financial Resource Strain: Not on file  Food Insecurity: Not on file   Transportation Needs: Not on file  Physical Activity: Not on file  Stress: Not on file  Social Connections: Not on file  Intimate Partner Violence: Not on file    Past Surgical History:  Procedure Laterality Date   ABDOMINAL HYSTERECTOMY     Partial   ABLATION     APPENDECTOMY     BICEPT TENODESIS Right 01/07/2018   Procedure: BICEPS TENODESIS;  Surgeon: Leim Fabry, MD;  Location: [facility];  Service: Orthopedics;  Laterality: Right;   BREAST BIOPSY Right 2012   BREAST BIOPSY Right 07/21/2019   venus clip, [place] Bx,positive   BREAST LUMPECTOMY Right 2012   positive/rad   BREAST SURGERY     CARDIAC SURGERY     CARDIAC VALVE REPLACEMENT     COLONOSCOPY  2012   ELECTROPHYSIOLOGIC STUDY N/A 01/23/2015   Procedure: CARDIOVERSION;  Surgeon: Corey Skains, MD;  Location: [facility];  Service: Cardiovascular;  Laterality: N/A;   ELECTROPHYSIOLOGIC STUDY N/A 05/27/2016   Procedure: CARDIOVERSION;  Surgeon: Corey Skains, MD;  Location: [facility];  Service: Cardiovascular;  Laterality: N/A;   ESOPHAGOGASTRODUODENOSCOPY  2012   EYE SURGERY     IR CT HEAD LTD  03/10/2020   IR PERCUTANEOUS ART THROMBECTOMY/INFUSION INTRACRANIAL INC DIAG ANGIO  03/10/2020   IR US GUIDE VASC ACCESS RIGHT  03/10/2020   MASTECTOMY Right    Mastectomy Right    MASTECTOMY WITH AXILLARY LYMPH NODE DISSECTION Right 08/09/2019   Procedure: MASTECTOMY WITH AXILLARY LYMPH NODE DISSECTION;  Surgeon: Robert Bellow, MD;  Location: [facility];  Service: General;  Laterality: Right;   MITRAL VALVE REPLACEMENT     mvp     PACEMAKER PLACEMENT N/A    RADIOLOGY WITH ANESTHESIA N/A 03/10/2020   Procedure: IR WITH ANESTHESIA;  Surgeon: Radiologist, Medication, MD;  Location: [place];  Service: Radiology;  Laterality: N/A;   REVERSE SHOULDER ARTHROPLASTY Right 01/07/2018   Procedure: REVERSE SHOULDER ARTHROPLASTY;  Surgeon: Leim Fabry, MD;  Location: [facility];  Service: Orthopedics;  Laterality: Right;   VSD REPAIR     VSD  REPAIR N/A     Family History  Problem Relation Age of Onset   Multiple myeloma Father    Esophageal cancer Brother    Breast cancer Mother  never treated, dx. >50   Esophageal cancer Brother    Breast cancer Daughter 16   Cancer Daughter 4       rare blood cancer    Allergies  Allergen Reactions   Atorvastatin Shortness Of Breath    Tolerates pravastatin   Calcium Shortness Of Breath   Fluoxetine     Other reaction(s): Unknown   Hydrochlorothiazide     Other reaction(s): Unknown       Latest Ref Rng & Units 02/04/2022   10:41 AM 01/12/2022    6:02 AM 01/11/2022    4:42 AM  CBC  WBC 4.0 - 10.5 K/uL 14.3  10.3  11.7   Hemoglobin 12.0 - 15.0 g/dL 12.7  10.6  10.5   Hematocrit 36.0 - 46.0 % 40.1  33.5  33.2   Platelets 150 - 400 K/uL 388  191  182       CMP     Component Value Date/Time   NA 137 02/04/2022 1041   NA 129 (L) 12/30/2014 0921   K 3.7 02/04/2022 1041   K 4.7 12/30/2014 0921   CL 98 02/04/2022 1041   CL 91 (L) 12/30/2014 0921   CO2 29 02/04/2022 1041   CO2 26 12/30/2014 0921   GLUCOSE 138 (H) 02/04/2022 1041   GLUCOSE 103 (H) 12/30/2014 0921   BUN 30 (H) 02/04/2022 1041   BUN 22 (H) 12/30/2014 0921   CREATININE 1.83 (H) 02/04/2022 1041   CREATININE 1.22 (H) 12/30/2014 0921   CALCIUM 9.0 02/04/2022 1041   CALCIUM 9.1 12/30/2014 0921   PROT 7.4 02/04/2022 1041   PROT 7.9 12/30/2014 0921   ALBUMIN 3.3 (L) 02/04/2022 1041   ALBUMIN 4.0 12/30/2014 0921   AST 27 02/04/2022 1041   AST 31 12/30/2014 0921   ALT 14 02/04/2022 1041   ALT 29 12/30/2014 0921   ALKPHOS 67 02/04/2022 1041   ALKPHOS 101 12/30/2014 0921   BILITOT 0.7 02/04/2022 1041   BILITOT 0.5 12/30/2014 0921   GFRNONAA 28 (L) 02/04/2022 1041   GFRNONAA 45 (L) 12/30/2014 0921   GFRAA 47 (L) 04/24/2020 0925   GFRAA 52 (L) 12/30/2014 0921     VAS [place] ABI WITH/WO TBI  Result Date: 03/30/2022  LOWER EXTREMITY DOPPLER STUDY Patient Name:  Latiffany Harwick  Date of Exam:    03/19/2022 Medical Rec #: [id number]             Accession #:    [id number] Date of Birth: 11-03-1945             Patient Gender: F Patient Age:   46 years Exam Location:  [facility] Procedure:      VAS [place] ABI WITH/WO TBI Referring Phys: Hortencia Pilar --------------------------------------------------------------------------------  Indications: Discoloration in Feet.  Performing Technologist: Almira Coaster RVS  Examination Guidelines: A complete evaluation includes at minimum, Doppler waveform signals and systolic blood pressure reading at the level of bilateral brachial, anterior tibial, and posterior tibial arteries, when vessel segments are accessible. Bilateral testing is considered an integral part of a complete examination. Photoelectric Plethysmograph (PPG) waveforms and toe systolic pressure readings are included as required and additional duplex testing as needed. Limited examinations for reoccurring indications may be performed as noted.  ABI Findings: +---------+------------------+-----+----------+--------+ Right    Rt Pressure (mmHg)IndexWaveform  Comment  +---------+------------------+-----+----------+--------+ Brachial 158                                       +---------+------------------+-----+----------+--------+  ATA      95                0.60 monophasic         +---------+------------------+-----+----------+--------+ PTA      164               1.04 biphasic           +---------+------------------+-----+----------+--------+ Great Toe107               0.68 Abnormal           +---------+------------------+-----+----------+--------+ +---------+------------------+-----+--------+-------+ Left     Lt Pressure (mmHg)IndexWaveformComment +---------+------------------+-----+--------+-------+ Brachial 143                                    +---------+------------------+-----+--------+-------+ ATA      124               0.78 biphasic         +---------+------------------+-----+--------+-------+ PTA      145               0.92 biphasic        +---------+------------------+-----+--------+-------+ Great Toe99                0.63 Abnormal        +---------+------------------+-----+--------+-------+ +-------+-----------+-----------+------------+------------+ ABI/TBIToday's ABIToday's TBIPrevious ABIPrevious TBI +-------+-----------+-----------+------------+------------+ Right  1.04       .68                                 +-------+-----------+-----------+------------+------------+ Left   .92        .63                                 +-------+-----------+-----------+------------+------------+  Summary: Right: Resting right ankle-brachial index is within normal range. No evidence of significant right lower extremity arterial disease. The right toe-brachial index is abnormal. Left: Resting left ankle-brachial index indicates mild left lower extremity arterial disease. The left toe-brachial index is abnormal.  *See table(s) above for measurements and observations.  Electronically signed by Hortencia Pilar MD on 03/30/2022 at 5:17:18 PM.    Final        Assessment & Plan:   1. PVD (peripheral vascular disease) ([place]) The patient has some evidence of peripheral arterial disease however there is acute ischemia or limb threatening symptoms.  I suspect that the discoloration may be related to mild venous insufficiency as well as her heart failure.  Patient will return to annual basis for follow-up. - VAS [place] ABI WITH/WO TBI  2. Chronic diastolic CHF (congestive heart failure) (HCC) Altered cardiac perfusion may also decrease capillary refill and cause some discoloration.  3. Primary hypertension Continue antihypertensive medications as already ordered, these medications have been reviewed and there are no changes at this time.    Current Outpatient Medications on File Prior to Visit  Medication Sig Dispense Refill    ACETAMINOPHEN EXTRA STRENGTH 500 MG capsule      ADVAIR DISKUS 100-50 MCG/DOSE AEPB Inhale 1 puff into the lungs 2 (two) times daily.     B Complex-C (B-COMPLEX WITH VITAMIN C) tablet Take 1 tablet by mouth daily.     donepezil (ARICEPT) 5 MG tablet Take 5 mg by mouth at bedtime.     ELIQUIS 5 MG TABS tablet Take 5  mg by mouth 2 (two) times daily.     empagliflozin (JARDIANCE) 10 MG TABS tablet Take 1 tablet (10 mg total) by mouth daily before breakfast. 30 tablet 5   ezetimibe (ZETIA) 10 MG tablet Take 10 mg by mouth daily.     furosemide (LASIX) 40 MG tablet Take 1 tablet (40 mg total) by mouth daily. 30 tablet    levothyroxine (SYNTHROID) 50 MCG tablet Take 50 mcg by mouth daily before breakfast.     lisinopril (ZESTRIL) 5 MG tablet TAKE (1) TABLET BY MOUTH EVERY DAY TO PROTECT KIDNEYS     melatonin 5 MG TABS Take by mouth.     Multiple Vitamin (MULTIVITAMIN) tablet Take 1 tablet by mouth daily.     pantoprazole (PROTONIX) 40 MG tablet Take 1 tablet (40 mg total) by mouth 2 (two) times daily.     polyethylene glycol (MIRALAX / GLYCOLAX) 17 g packet Take 17 g by mouth daily as needed for mild constipation. 14 each 0   potassium chloride (KLOR-CON) 10 MEQ tablet Take 10 mEq by mouth 2 (two) times daily.     pravastatin (PRAVACHOL) 40 MG tablet Take 40 mg by mouth at bedtime.     predniSONE (DELTASONE) 5 MG tablet Take 5 mg by mouth daily with breakfast.     tamoxifen (NOLVADEX) 20 MG tablet TAKE (1) TABLET BY MOUTH EVERY DAY 90 tablet 3   ursodiol (ACTIGALL) 300 MG capsule Take 2 capsules by mouth 2 (two) times daily.     venlafaxine XR (EFFEXOR-XR) 37.5 MG 24 hr capsule Take 75 mg by mouth daily.     VENTOLIN HFA 108 (90 Base) MCG/ACT inhaler Inhale 2 puffs into the lungs every 4 (four) hours as needed.     Vitamin D, Ergocalciferol, (DRISDOL) 1.25 MG (50000 UNIT) CAPS capsule Take 50,000 Units by mouth every 14 (fourteen) days. On Sundays     ipratropium-albuterol (DUONEB) 0.5-2.5 (3) MG/3ML  SOLN Take 3 mLs by nebulization 3 (three) times daily. (Patient not taking: Reported on 12/19/2021)     No current facility-administered medications on file prior to visit.    There are no Patient Instructions on file for this visit. No follow-ups on file.   Kris Hartmann, NP

## 2022-04-07 DIAGNOSIS — H524 Presbyopia: Secondary | ICD-10-CM | POA: Diagnosis not present

## 2022-04-07 DIAGNOSIS — H353132 Nonexudative age-related macular degeneration, bilateral, intermediate dry stage: Secondary | ICD-10-CM | POA: Diagnosis not present

## 2022-04-09 ENCOUNTER — Telehealth: Payer: Self-pay

## 2022-04-09 NOTE — Telephone Encounter (Signed)
Spoke with daughter at providers request and informed her that per Darylene Price, FNP, her mother should stop the jardiance since she has c/o recurrent UTI's since being on jardiance. If there are any issues, concerns or symptoms after stopping jardiance, or if she would like to be seen by Darylene Price, FNP, again, advised them we would be happy to see her. At this time pt's daughter stated they will stop jardiance but declined follow up appt for now. Georg Ruddle, RN

## 2022-04-16 DIAGNOSIS — J9610 Chronic respiratory failure, unspecified whether with hypoxia or hypercapnia: Secondary | ICD-10-CM | POA: Diagnosis not present

## 2022-04-16 DIAGNOSIS — J441 Chronic obstructive pulmonary disease with (acute) exacerbation: Secondary | ICD-10-CM | POA: Diagnosis not present

## 2022-04-22 DIAGNOSIS — R911 Solitary pulmonary nodule: Secondary | ICD-10-CM | POA: Diagnosis not present

## 2022-04-24 DIAGNOSIS — K8309 Other cholangitis: Secondary | ICD-10-CM | POA: Diagnosis not present

## 2022-04-24 DIAGNOSIS — I739 Peripheral vascular disease, unspecified: Secondary | ICD-10-CM | POA: Diagnosis not present

## 2022-04-24 DIAGNOSIS — E1122 Type 2 diabetes mellitus with diabetic chronic kidney disease: Secondary | ICD-10-CM | POA: Diagnosis not present

## 2022-04-24 DIAGNOSIS — R1110 Vomiting, unspecified: Secondary | ICD-10-CM | POA: Diagnosis not present

## 2022-04-24 DIAGNOSIS — Z853 Personal history of malignant neoplasm of breast: Secondary | ICD-10-CM | POA: Diagnosis not present

## 2022-04-24 DIAGNOSIS — J189 Pneumonia, unspecified organism: Secondary | ICD-10-CM | POA: Diagnosis not present

## 2022-04-24 DIAGNOSIS — E860 Dehydration: Secondary | ICD-10-CM | POA: Diagnosis not present

## 2022-04-24 DIAGNOSIS — J449 Chronic obstructive pulmonary disease, unspecified: Secondary | ICD-10-CM | POA: Diagnosis not present

## 2022-04-24 DIAGNOSIS — E032 Hypothyroidism due to medicaments and other exogenous substances: Secondary | ICD-10-CM | POA: Diagnosis not present

## 2022-04-24 DIAGNOSIS — I130 Hypertensive heart and chronic kidney disease with heart failure and stage 1 through stage 4 chronic kidney disease, or unspecified chronic kidney disease: Secondary | ICD-10-CM | POA: Diagnosis not present

## 2022-04-24 DIAGNOSIS — E2740 Unspecified adrenocortical insufficiency: Secondary | ICD-10-CM | POA: Diagnosis not present

## 2022-04-24 DIAGNOSIS — N183 Chronic kidney disease, stage 3 unspecified: Secondary | ICD-10-CM | POA: Diagnosis not present

## 2022-04-24 DIAGNOSIS — E875 Hyperkalemia: Secondary | ICD-10-CM | POA: Diagnosis not present

## 2022-04-24 DIAGNOSIS — I5032 Chronic diastolic (congestive) heart failure: Secondary | ICD-10-CM | POA: Diagnosis not present

## 2022-04-24 DIAGNOSIS — I4819 Other persistent atrial fibrillation: Secondary | ICD-10-CM | POA: Diagnosis not present

## 2022-04-24 DIAGNOSIS — S32011A Stable burst fracture of first lumbar vertebra, initial encounter for closed fracture: Secondary | ICD-10-CM | POA: Diagnosis not present

## 2022-04-24 DIAGNOSIS — R531 Weakness: Secondary | ICD-10-CM | POA: Diagnosis not present

## 2022-04-24 DIAGNOSIS — N179 Acute kidney failure, unspecified: Secondary | ICD-10-CM | POA: Diagnosis not present

## 2022-04-24 DIAGNOSIS — R918 Other nonspecific abnormal finding of lung field: Secondary | ICD-10-CM | POA: Diagnosis not present

## 2022-04-24 DIAGNOSIS — R54 Age-related physical debility: Secondary | ICD-10-CM | POA: Diagnosis not present

## 2022-04-24 DIAGNOSIS — Z7901 Long term (current) use of anticoagulants: Secondary | ICD-10-CM | POA: Diagnosis not present

## 2022-04-24 DIAGNOSIS — Z20822 Contact with and (suspected) exposure to covid-19: Secondary | ICD-10-CM | POA: Diagnosis not present

## 2022-04-28 DIAGNOSIS — I5032 Chronic diastolic (congestive) heart failure: Secondary | ICD-10-CM | POA: Diagnosis not present

## 2022-04-28 DIAGNOSIS — J189 Pneumonia, unspecified organism: Secondary | ICD-10-CM | POA: Diagnosis not present

## 2022-04-28 DIAGNOSIS — D72829 Elevated white blood cell count, unspecified: Secondary | ICD-10-CM | POA: Diagnosis not present

## 2022-04-28 DIAGNOSIS — J440 Chronic obstructive pulmonary disease with acute lower respiratory infection: Secondary | ICD-10-CM | POA: Diagnosis not present

## 2022-04-28 DIAGNOSIS — I69318 Other symptoms and signs involving cognitive functions following cerebral infarction: Secondary | ICD-10-CM | POA: Diagnosis not present

## 2022-04-28 DIAGNOSIS — Z09 Encounter for follow-up examination after completed treatment for conditions other than malignant neoplasm: Secondary | ICD-10-CM | POA: Diagnosis not present

## 2022-04-28 DIAGNOSIS — I69320 Aphasia following cerebral infarction: Secondary | ICD-10-CM | POA: Diagnosis not present

## 2022-04-28 DIAGNOSIS — E1122 Type 2 diabetes mellitus with diabetic chronic kidney disease: Secondary | ICD-10-CM | POA: Diagnosis not present

## 2022-04-28 DIAGNOSIS — I130 Hypertensive heart and chronic kidney disease with heart failure and stage 1 through stage 4 chronic kidney disease, or unspecified chronic kidney disease: Secondary | ICD-10-CM | POA: Diagnosis not present

## 2022-04-28 DIAGNOSIS — I4819 Other persistent atrial fibrillation: Secondary | ICD-10-CM | POA: Diagnosis not present

## 2022-04-28 DIAGNOSIS — F0153 Vascular dementia, unspecified severity, with mood disturbance: Secondary | ICD-10-CM | POA: Diagnosis not present

## 2022-04-28 DIAGNOSIS — R0602 Shortness of breath: Secondary | ICD-10-CM | POA: Diagnosis not present

## 2022-04-28 DIAGNOSIS — N179 Acute kidney failure, unspecified: Secondary | ICD-10-CM | POA: Diagnosis not present

## 2022-04-28 DIAGNOSIS — Z79899 Other long term (current) drug therapy: Secondary | ICD-10-CM | POA: Diagnosis not present

## 2022-04-28 DIAGNOSIS — E1151 Type 2 diabetes mellitus with diabetic peripheral angiopathy without gangrene: Secondary | ICD-10-CM | POA: Diagnosis not present

## 2022-04-28 DIAGNOSIS — R531 Weakness: Secondary | ICD-10-CM | POA: Diagnosis not present

## 2022-04-28 DIAGNOSIS — E032 Hypothyroidism due to medicaments and other exogenous substances: Secondary | ICD-10-CM | POA: Diagnosis not present

## 2022-04-28 DIAGNOSIS — N1832 Chronic kidney disease, stage 3b: Secondary | ICD-10-CM | POA: Diagnosis not present

## 2022-04-28 DIAGNOSIS — F01511 Vascular dementia, unspecified severity, with agitation: Secondary | ICD-10-CM | POA: Diagnosis not present

## 2022-04-28 DIAGNOSIS — E782 Mixed hyperlipidemia: Secondary | ICD-10-CM | POA: Diagnosis not present

## 2022-04-28 DIAGNOSIS — F32A Depression, unspecified: Secondary | ICD-10-CM | POA: Diagnosis not present

## 2022-04-28 DIAGNOSIS — R5383 Other fatigue: Secondary | ICD-10-CM | POA: Diagnosis not present

## 2022-05-14 DIAGNOSIS — J449 Chronic obstructive pulmonary disease, unspecified: Secondary | ICD-10-CM | POA: Diagnosis not present

## 2022-05-27 DIAGNOSIS — E875 Hyperkalemia: Secondary | ICD-10-CM | POA: Diagnosis not present

## 2022-05-27 DIAGNOSIS — R399 Unspecified symptoms and signs involving the genitourinary system: Secondary | ICD-10-CM | POA: Diagnosis not present

## 2022-05-27 DIAGNOSIS — D72829 Elevated white blood cell count, unspecified: Secondary | ICD-10-CM | POA: Diagnosis not present

## 2022-05-27 DIAGNOSIS — N390 Urinary tract infection, site not specified: Secondary | ICD-10-CM | POA: Diagnosis not present

## 2022-05-28 DIAGNOSIS — N179 Acute kidney failure, unspecified: Secondary | ICD-10-CM | POA: Diagnosis not present

## 2022-05-28 DIAGNOSIS — I4819 Other persistent atrial fibrillation: Secondary | ICD-10-CM | POA: Diagnosis not present

## 2022-05-28 DIAGNOSIS — N1832 Chronic kidney disease, stage 3b: Secondary | ICD-10-CM | POA: Diagnosis not present

## 2022-05-28 DIAGNOSIS — E1122 Type 2 diabetes mellitus with diabetic chronic kidney disease: Secondary | ICD-10-CM | POA: Diagnosis not present

## 2022-05-28 DIAGNOSIS — F01511 Vascular dementia, unspecified severity, with agitation: Secondary | ICD-10-CM | POA: Diagnosis not present

## 2022-05-28 DIAGNOSIS — I69320 Aphasia following cerebral infarction: Secondary | ICD-10-CM | POA: Diagnosis not present

## 2022-05-28 DIAGNOSIS — J440 Chronic obstructive pulmonary disease with acute lower respiratory infection: Secondary | ICD-10-CM | POA: Diagnosis not present

## 2022-05-28 DIAGNOSIS — J189 Pneumonia, unspecified organism: Secondary | ICD-10-CM | POA: Diagnosis not present

## 2022-05-28 DIAGNOSIS — F0153 Vascular dementia, unspecified severity, with mood disturbance: Secondary | ICD-10-CM | POA: Diagnosis not present

## 2022-05-28 DIAGNOSIS — I5032 Chronic diastolic (congestive) heart failure: Secondary | ICD-10-CM | POA: Diagnosis not present

## 2022-05-28 DIAGNOSIS — E032 Hypothyroidism due to medicaments and other exogenous substances: Secondary | ICD-10-CM | POA: Diagnosis not present

## 2022-05-28 DIAGNOSIS — I130 Hypertensive heart and chronic kidney disease with heart failure and stage 1 through stage 4 chronic kidney disease, or unspecified chronic kidney disease: Secondary | ICD-10-CM | POA: Diagnosis not present

## 2022-05-28 DIAGNOSIS — E1151 Type 2 diabetes mellitus with diabetic peripheral angiopathy without gangrene: Secondary | ICD-10-CM | POA: Diagnosis not present

## 2022-05-28 DIAGNOSIS — F32A Depression, unspecified: Secondary | ICD-10-CM | POA: Diagnosis not present

## 2022-05-28 DIAGNOSIS — I69318 Other symptoms and signs involving cognitive functions following cerebral infarction: Secondary | ICD-10-CM | POA: Diagnosis not present

## 2022-05-28 DIAGNOSIS — E782 Mixed hyperlipidemia: Secondary | ICD-10-CM | POA: Diagnosis not present

## 2022-06-10 DIAGNOSIS — I469 Cardiac arrest, cause unspecified: Secondary | ICD-10-CM | POA: Diagnosis not present

## 2022-06-14 DIAGNOSIS — J449 Chronic obstructive pulmonary disease, unspecified: Secondary | ICD-10-CM | POA: Diagnosis not present

## 2022-06-23 DIAGNOSIS — R4586 Emotional lability: Secondary | ICD-10-CM | POA: Diagnosis not present

## 2022-06-23 DIAGNOSIS — R413 Other amnesia: Secondary | ICD-10-CM | POA: Diagnosis not present

## 2022-06-23 DIAGNOSIS — F01B3 Vascular dementia, moderate, with mood disturbance: Secondary | ICD-10-CM | POA: Diagnosis not present

## 2022-06-23 DIAGNOSIS — G479 Sleep disorder, unspecified: Secondary | ICD-10-CM | POA: Diagnosis not present

## 2022-07-14 DIAGNOSIS — J449 Chronic obstructive pulmonary disease, unspecified: Secondary | ICD-10-CM | POA: Diagnosis not present

## 2022-07-16 ENCOUNTER — Ambulatory Visit
Admission: RE | Admit: 2022-07-16 | Discharge: 2022-07-16 | Disposition: A | Discharge status: Home / Self Care | Payer: Medicare Other | Source: Ambulatory Visit | Attending: Oncology | Admitting: Oncology

## 2022-07-16 DIAGNOSIS — Z1231 Encounter for screening mammogram for malignant neoplasm of breast: Secondary | ICD-10-CM | POA: Diagnosis not present

## 2022-07-16 DIAGNOSIS — Z9981 Dependence on supplemental oxygen: Secondary | ICD-10-CM | POA: Insufficient documentation

## 2022-07-16 DIAGNOSIS — Z08 Encounter for follow-up examination after completed treatment for malignant neoplasm: Secondary | ICD-10-CM | POA: Insufficient documentation

## 2022-07-16 DIAGNOSIS — Z853 Personal history of malignant neoplasm of breast: Secondary | ICD-10-CM | POA: Insufficient documentation

## 2022-07-20 ENCOUNTER — Encounter (INDEPENDENT_AMBULATORY_CARE_PROVIDER_SITE_OTHER): Payer: Self-pay

## 2022-08-03 ENCOUNTER — Ambulatory Visit (INDEPENDENT_AMBULATORY_CARE_PROVIDER_SITE_OTHER): Payer: Medicare Other

## 2022-08-03 ENCOUNTER — Ambulatory Visit
Admission: EM | Admit: 2022-08-03 | Discharge: 2022-08-03 | Disposition: A | Discharge status: Home / Self Care | Payer: Medicare Other | Attending: Internal Medicine | Admitting: Internal Medicine

## 2022-08-03 DIAGNOSIS — R059 Cough, unspecified: Secondary | ICD-10-CM | POA: Diagnosis not present

## 2022-08-03 DIAGNOSIS — J209 Acute bronchitis, unspecified: Secondary | ICD-10-CM | POA: Diagnosis not present

## 2022-08-03 MED ORDER — AZITHROMYCIN 250 MG PO TABS: ORAL_TABLET | ORAL | 0 refills | Status: DC

## 2022-08-03 NOTE — ED Provider Notes (Signed)
[facility]    CSN: [id number] Arrival date & time: 08/03/22  0912      History   Chief Complaint Chief Complaint  Patient presents with   Cough   Chest Congestion    HPI Adriana Martin is a 76 y.o. female who presents with a family menber  and her care giver due to having cough x 2 weeks. Had a virtiual appointment last week and was placed on Doxy and prednisone but the cough has not resolved.  She had a neb treatment this am and uses her COPD inhalers qd. She is also on O2 all the time. She finished her antibiotic last 4-5 days ago.  Her pulse ox is about the same when she is well.   Past Medical History:  Diagnosis Date   Alcohol use    Anemia    Anxiety    Atrial fibrillation ([place])    B12 deficiency    Breast cancer ([place])    RT Lumpectomy c radiation 2012   Breast cancer (HCC)    CHF (congestive heart failure) (HCC)    Chronic diastolic CHF (congestive heart failure) (HCC)    Chronic kidney disease    Cirrhosis (HCC)    CKD (chronic kidney disease), stage III (HCC)    COPD (chronic obstructive pulmonary disease) (HCC)    Dementia (HCC)    Depression    Family history of breast cancer    Family history of esophageal cancer    Family history of multiple myeloma    GERD (gastroesophageal reflux disease)    Headache    Heart murmur    HLD (hyperlipidemia)    HTN (hypertension)    Hyperlipemia    Hyperlipidemia    Hypertension    Hypothyroid    Hypothyroidism    Iron (Fe) deficiency anemia    Monoclonal gammopathy    Pacemaker    Personal history of radiation therapy    Presence of permanent cardiac pacemaker    Shortness of breath dyspnea    Thrombocytopenia (HCC)    Vitamin D deficiency     Patient Active Problem List   Diagnosis Date Noted   Diarrhea 01/12/2022   Severe sepsis ([place]) 01/10/2022   Laceration of left forearm 01/10/2022   Severe sepsis with acute organ dysfunction ([place]) 01/09/2022   Weakness    Acute respiratory  failure with hypoxia (HCC)    Cardiac arrest (HCC)    Epigastric pain    CHF exacerbation ([place]) 04/19/2021   Colitis 04/05/2021   Acute colitis 04/04/2021   Acute kidney injury superimposed on CKD ([place]) 04/04/2021   HTN (hypertension) 04/04/2021   Atrial fibrillation, chronic (HCC) 04/04/2021   Chronic diastolic CHF (congestive heart failure) ([place]) 04/04/2021   Rectal bleeding 04/04/2021   COPD (chronic obstructive pulmonary disease) ([place]) 04/04/2021   HLD (hyperlipidemia) 04/04/2021   Hypothyroid 04/04/2021   Cirrhosis ([place]) 04/04/2021   Breast cancer ([place]) 04/04/2021   Dementia ([place]) 04/04/2021   History of radiation therapy 04/02/2021   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   Senile purpura ([place]) 10/30/2020   Acquired thrombophilia ([place]) 06/13/2020   Essential hypertension 03/14/2020   Hyperlipidemia 03/14/2020   Acute ischemic stroke ([place]) L MCA s/p mechanical thrombectomy, embolic d/t AF not on [facility] 03/10/2020   History of stroke 03/10/2020   Osteopenia 01/21/2020   Family history of breast cancer    Family history of multiple myeloma    Family history of esophageal cancer    Displaced comminuted fracture of  shaft of right humerus 12/05/2018   Recurrent falls 12/05/2018   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/2018   MGUS (monoclonal gammopathy of unknown significance) 12/14/2017   Imbalance 06/24/2017   Tremor 06/24/2017   ICD (implantable cardioverter-defibrillator), biventricular, 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   Carcinoma of overlapping sites of right breast in female, estrogen receptor positive (HCC) 06/16/2016   Acute exacerbation of CHF (congestive heart failure) (HCC) 06/07/2016   A-fib (HCC) 04/08/2016   COPD exacerbation (HCC) 03/22/2016   Acute bronchitis 03/22/2016   Atrial fibrillation with RVR (HCC) 03/22/2016   Chronic renal insufficiency 03/22/2016   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/30/2016   Cough with expectoration 06/04/2015   Essential  thrombocytosis (HCC) 02/01/2015   Anemia 02/01/2015   Atrial flutter (HCC) 01/23/2015   Atrial fibrillation (HCC) 01/23/2015   Other long term (current) drug therapy 11/06/2014   High risk medication use 11/06/2014   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   Chronic diastolic heart failure (HCC) 08/01/2014   Severe mitral insufficiency 08/01/2014   Long term current use of anticoagulant 06/06/2014   S/P mitral valve repair 06/06/2014   Anxiety 05/13/2014   Chronic obstructive pulmonary disease (HCC) 03/22/2014   Moderate episode of recurrent major depressive disorder (HCC) 03/22/2014   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   Avitaminosis D 02/19/2014   GERD (gastroesophageal reflux disease) 02/19/2014    Past Surgical History:  Procedure Laterality Date   ABDOMINAL HYSTERECTOMY     Partial   ABLATION     APPENDECTOMY     BICEPT TENODESIS Right 01/07/2018   Procedure: BICEPS TENODESIS;  Surgeon: Patel, Sunny, MD;  Location: [facility];  Service: Orthopedics;  Laterality: Right;   BREAST BIOPSY Right 2012   BREAST BIOPSY Right 07/21/2019   venus clip, US Bx,positive   BREAST LUMPECTOMY Right 2012   positive/rad   BREAST SURGERY     CARDIAC SURGERY     CARDIAC VALVE REPLACEMENT     COLONOSCOPY  2012   ELECTROPHYSIOLOGIC STUDY N/A 01/23/2015   Procedure: CARDIOVERSION;  Surgeon: Bruce J Kowalski, MD;  Location: [facility];  Service: Cardiovascular;  Laterality: N/A;   ELECTROPHYSIOLOGIC STUDY N/A 05/27/2016   Procedure: CARDIOVERSION;  Surgeon: Bruce J Kowalski, MD;  Location: [facility];  Service: Cardiovascular;  Laterality: N/A;   ESOPHAGOGASTRODUODENOSCOPY  2012   EYE SURGERY     IR CT HEAD LTD  03/10/2020   IR PERCUTANEOUS ART THROMBECTOMY/INFUSION INTRACRANIAL INC DIAG ANGIO  03/10/2020   IR US GUIDE VASC ACCESS RIGHT  03/10/2020   MASTECTOMY Right    Mastectomy Right    MASTECTOMY WITH AXILLARY LYMPH NODE DISSECTION Right 08/09/2019   Procedure: MASTECTOMY WITH  AXILLARY LYMPH NODE DISSECTION;  Surgeon: Byrnett, Jeffrey W, MD;  Location: [facility];  Service: General;  Laterality: Right;   MITRAL VALVE REPLACEMENT     mvp     PACEMAKER PLACEMENT N/A    RADIOLOGY WITH ANESTHESIA N/A 03/10/2020   Procedure: IR WITH ANESTHESIA;  Surgeon: Radiologist, Medication, MD;  Location: [facility];  Service: Radiology;  Laterality: N/A;   REVERSE SHOULDER ARTHROPLASTY Right 01/07/2018   Procedure: REVERSE SHOULDER ARTHROPLASTY;  Surgeon: Patel, Sunny, MD;  Location: [facility];  Service: Orthopedics;  Laterality: Right;   VSD REPAIR     VSD REPAIR N/A     OB History   No obstetric history on file.      Home Medications    Prior to Admission medications   Medication Sig Start Date End Date Taking? Authorizing   Provider  ACETAMINOPHEN EXTRA STRENGTH 500 MG capsule  08/12/21  Yes [provider]  ADVAIR DISKUS 100-50 MCG/DOSE AEPB Inhale 1 puff into the lungs 2 (two) times daily. 04/15/20  Yes [provider]  azithromycin (ZITHROMAX Z-PAK) 250 MG tablet 2 today, then one qd x 4 days 08/03/22  Yes Rodriguez-Southworth, , PA-C  B Complex-C (B-COMPLEX WITH VITAMIN C) tablet Take 1 tablet by mouth daily.   Yes [provider]  ELIQUIS 5 MG TABS tablet Take 5 mg by mouth 2 (two) times daily. 03/05/21  Yes [provider]  ezetimibe (ZETIA) 10 MG tablet Take 10 mg by mouth daily. 02/05/21  Yes [provider]  furosemide (LASIX) 40 MG tablet Take 1 tablet (40 mg total) by mouth daily. 04/29/21  Yes Amery, Sahar, MD  ipratropium-albuterol (DUONEB) 0.5-2.5 (3) MG/3ML SOLN Take 3 mLs by nebulization 3 (three) times daily.   Yes [provider]  levothyroxine (SYNTHROID) 50 MCG tablet Take 50 mcg by mouth daily before breakfast. 02/05/21  Yes [provider]  lisinopril (ZESTRIL) 5 MG tablet TAKE (1) TABLET BY MOUTH EVERY DAY TO PROTECT KIDNEYS 07/21/21  Yes [provider]  melatonin 5 MG TABS Take by  mouth.   Yes [provider]  Multiple Vitamin (MULTIVITAMIN) tablet Take 1 tablet by mouth daily.   Yes [provider]  pantoprazole (PROTONIX) 40 MG tablet Take 1 tablet (40 mg total) by mouth 2 (two) times daily. 04/28/21  Yes Amery, Sahar, MD  potassium chloride (KLOR-CON) 10 MEQ tablet Take 10 mEq by mouth 2 (two) times daily. 11/11/21  Yes [provider]  pravastatin (PRAVACHOL) 40 MG tablet Take 40 mg by mouth at bedtime. 04/02/21  Yes [provider]  predniSONE (DELTASONE) 5 MG tablet Take 5 mg by mouth daily with breakfast.   Yes [provider]  tamoxifen (NOLVADEX) 20 MG tablet TAKE (1) TABLET BY MOUTH EVERY DAY 06/20/21  Yes Allen, Lauren G, NP  ursodiol (ACTIGALL) 300 MG capsule Take 2 capsules by mouth 2 (two) times daily. 01/19/22  Yes [provider]  venlafaxine XR (EFFEXOR-XR) 37.5 MG 24 hr capsule Take 75 mg by mouth daily. 01/14/20  Yes [provider]  VENTOLIN HFA 108 (90 Base) MCG/ACT inhaler Inhale 2 puffs into the lungs every 4 (four) hours as needed. 01/31/21  Yes [provider]  Vitamin D, Ergocalciferol, (DRISDOL) 1.25 MG (50000 UNIT) CAPS capsule Take 50,000 Units by mouth every 14 (fourteen) days. On Sundays   Yes [provider]  donepezil (ARICEPT) 5 MG tablet Take 5 mg by mouth at bedtime. 03/21/21   [provider]  empagliflozin (JARDIANCE) 10 MG TABS tablet Take 1 tablet (10 mg total) by mouth daily before breakfast. 10/17/21   Hackney, Tina A, FNP  polyethylene glycol (MIRALAX / GLYCOLAX) 17 g packet Take 17 g by mouth daily as needed for mild constipation. 04/28/21   Amery, Sahar, MD    Family History Family History  Problem Relation Age of Onset   Multiple myeloma Father    Esophageal cancer Brother    Breast cancer Mother        never treated, dx. >50   Esophageal cancer Brother    Breast cancer Daughter 44   Cancer Daughter 58       rare blood cancer    Social  History Social History   Tobacco Use   Smoking status: Never   Smokeless tobacco: Never  Vaping Use   Vaping   Use: Never used  Substance Use Topics   Alcohol use: Not Currently    Comment: socially   Drug use: Never     Allergies   Atorvastatin, Calcium, Fluoxetine, and Hydrochlorothiazide   Review of Systems Review of Systems  Constitutional:  Positive for appetite change, diaphoresis and fatigue. Negative for activity change and fever.  HENT:  Negative for congestion and rhinorrhea.   Eyes:  Negative for discharge.  Respiratory:  Positive for cough and wheezing.   Cardiovascular:  Negative for chest pain and leg swelling.  Musculoskeletal:  Negative for myalgias.     Physical Exam Triage Vital Signs ED Triage Vitals  Enc Vitals Group     BP 08/03/22 1020 133/62     Pulse Rate 08/03/22 1020 76     Resp --      Temp 08/03/22 1020 98 F (36.7 C)     Temp Source 08/03/22 1020 Oral     SpO2 08/03/22 1020 95 %     Weight 08/03/22 1018 164 lb (74.4 kg)     Height 08/03/22 1018 5' 2" (1.575 m)     Head Circumference --      Peak Flow --      Pain Score --      Pain Loc --      Pain Edu? --      Excl. in GC? --    No data found.  Updated Vital Signs BP 133/62 (BP Location: Right Arm)   Pulse 76   Temp 98 F (36.7 C) (Oral)   Ht 5' 2" (1.575 m)   Wt 164 lb (74.4 kg)   SpO2 95%   BMI 30.00 kg/m   Visual Acuity Right Eye Distance:   Left Eye Distance:   Bilateral Distance:    Right Eye Near:   Left Eye Near:    Bilateral Near:     Physical Exam Vitals and nursing note reviewed.  Constitutional:      Appearance: She is not toxic-appearing.     Comments: Looks fatigued, and her skin is a little clammy  HENT:     Right Ear: Tympanic membrane, ear canal and external ear normal.     Left Ear: Tympanic membrane, ear canal and external ear normal.     Nose: Nose normal.     Mouth/Throat:     Mouth: Mucous membranes are dry.  Eyes:     General: No  scleral icterus.    Conjunctiva/sclera: Conjunctivae normal.  Cardiovascular:     Rate and Rhythm: Normal rate and regular rhythm.  Pulmonary:     Effort: Pulmonary effort is normal.     Breath sounds: Rales present.     Comments: Has crackles on both bases Musculoskeletal:     Cervical back: Neck supple.     Right lower leg: No edema.     Left lower leg: No edema.  Lymphadenopathy:     Cervical: No cervical adenopathy.  Skin:    General: Skin is warm.  Neurological:     Mental Status: She is alert and oriented to person, place, and time.     Comments: Sitting on a wheelchair  Psychiatric:        Mood and Affect: Mood normal.        Behavior: Behavior normal.        Thought Content: Thought content normal.      UC Treatments / Results  Labs (all labs ordered are listed, but only abnormal results are displayed)   Labs Reviewed - No data to display  EKG   Radiology DG Chest 2 View  Result Date: 08/03/2022 CLINICAL DATA:  Cough x2 weeks EXAM: CHEST - 2 VIEW COMPARISON:  01/09/2022 FINDINGS: Transverse diameter of heart is increased. There is prosthetic cardiac valve. Pacemaker battery is seen in the left infraclavicular region. Biventricular pacer leads are noted. There are no signs of pulmonary edema or focal pulmonary consolidation. There is smooth marginated in right lower lung field with no significant interval change. There is previous right shoulder arthroplasty. IMPRESSION: Cardiomegaly. There are no signs of pulmonary edema or new focal infiltrates. Electronically Signed   By: Palani  Rathinasamy M.D.   On: 08/03/2022 11:34    Procedures Procedures (including critical care time)  Medications Ordered in UC Medications - No data to display  Initial Impression / Assessment and Plan / UC Course  I have reviewed the triage vital signs and the nursing notes.  Pertinent  imaging results that were available during my care of the patient were reviewed by me and considered  in my medical decision making (see chart for details).  Unresolved bronchitis  Advised to continue with neb treatments I placed her on Z pack  See instructions Final Clinical Impressions(s) / UC Diagnoses  Unresolved bronchitis Final diagnoses:  Acute bronchitis, unspecified organism     Discharge Instructions      Please have her follow up with her primary care provider in 7 days to recheck her lungs.      ED Prescriptions     Medication Sig Dispense Auth. Provider   azithromycin (ZITHROMAX Z-PAK) 250 MG tablet 2 today, then one qd x 4 days 6 tablet Rodriguez-Southworth, , PA-C      PDMP not reviewed this encounter.   Rodriguez-Southworth, , PA-C 08/03/22 2012  

## 2022-08-03 NOTE — ED Triage Notes (Signed)
Pt accompanied by family, c/o cough & congestion going on 2 weeks, pt had virtual appt and was put on abx but still persistent

## 2022-08-03 NOTE — Discharge Instructions (Addendum)
Please have her follow up with her primary care provider in 7 days to recheck her lungs.

## 2022-08-05 ENCOUNTER — Other Ambulatory Visit: Payer: Self-pay

## 2022-08-05 DIAGNOSIS — Z08 Encounter for follow-up examination after completed treatment for malignant neoplasm: Secondary | ICD-10-CM

## 2022-08-06 ENCOUNTER — Telehealth: Payer: Self-pay | Admitting: *Deleted

## 2022-08-06 DIAGNOSIS — J441 Chronic obstructive pulmonary disease with (acute) exacerbation: Secondary | ICD-10-CM | POA: Diagnosis not present

## 2022-08-06 DIAGNOSIS — C50811 Malignant neoplasm of overlapping sites of right female breast: Secondary | ICD-10-CM

## 2022-08-06 MED ORDER — TAMOXIFEN CITRATE 20 MG PO TABS: ORAL_TABLET | ORAL | 3 refills | Status: DC

## 2022-08-06 NOTE — Telephone Encounter (Signed)
Refill fax from pharmacy for tamoxifen. Rx Rf per Lacretia Nicks, NP

## 2022-08-07 ENCOUNTER — Inpatient Hospital Stay: Payer: Medicare Other

## 2022-08-07 ENCOUNTER — Inpatient Hospital Stay: Payer: Medicare Other | Admitting: Oncology

## 2022-08-18 DIAGNOSIS — T148XXA Other injury of unspecified body region, initial encounter: Secondary | ICD-10-CM | POA: Diagnosis not present

## 2022-08-18 DIAGNOSIS — Z87828 Personal history of other (healed) physical injury and trauma: Secondary | ICD-10-CM | POA: Diagnosis not present

## 2022-08-18 DIAGNOSIS — J3489 Other specified disorders of nose and nasal sinuses: Secondary | ICD-10-CM | POA: Diagnosis not present

## 2022-08-18 DIAGNOSIS — J449 Chronic obstructive pulmonary disease, unspecified: Secondary | ICD-10-CM | POA: Diagnosis not present

## 2022-08-18 DIAGNOSIS — R059 Cough, unspecified: Secondary | ICD-10-CM | POA: Diagnosis not present

## 2022-08-18 DIAGNOSIS — R0781 Pleurodynia: Secondary | ICD-10-CM | POA: Diagnosis not present

## 2022-08-18 DIAGNOSIS — I517 Cardiomegaly: Secondary | ICD-10-CM | POA: Diagnosis not present

## 2022-08-24 ENCOUNTER — Inpatient Hospital Stay: Payer: Medicare Other | Admitting: Oncology

## 2022-08-24 ENCOUNTER — Inpatient Hospital Stay: Payer: Medicare Other

## 2022-08-28 ENCOUNTER — Inpatient Hospital Stay: Payer: Medicare Other | Admitting: Oncology

## 2022-08-28 ENCOUNTER — Inpatient Hospital Stay: Payer: Medicare Other | Attending: Radiation Oncology

## 2022-08-28 ENCOUNTER — Encounter: Payer: Self-pay | Admitting: Oncology

## 2022-08-28 VITALS — BP 129/54 | HR 59 | Temp 96.9°F | Resp 16 | Wt 169.5 lb

## 2022-08-28 DIAGNOSIS — Z853 Personal history of malignant neoplasm of breast: Secondary | ICD-10-CM

## 2022-08-28 DIAGNOSIS — Z79810 Long term (current) use of selective estrogen receptor modulators (SERMs): Secondary | ICD-10-CM

## 2022-08-28 DIAGNOSIS — C50911 Malignant neoplasm of unspecified site of right female breast: Secondary | ICD-10-CM | POA: Diagnosis not present

## 2022-08-28 DIAGNOSIS — Z5181 Encounter for therapeutic drug level monitoring: Secondary | ICD-10-CM

## 2022-08-28 DIAGNOSIS — I130 Hypertensive heart and chronic kidney disease with heart failure and stage 1 through stage 4 chronic kidney disease, or unspecified chronic kidney disease: Secondary | ICD-10-CM | POA: Insufficient documentation

## 2022-08-28 DIAGNOSIS — Z170 Estrogen receptor positive status [ER+]: Secondary | ICD-10-CM | POA: Diagnosis not present

## 2022-08-28 DIAGNOSIS — Z923 Personal history of irradiation: Secondary | ICD-10-CM | POA: Diagnosis not present

## 2022-08-28 DIAGNOSIS — D472 Monoclonal gammopathy: Secondary | ICD-10-CM | POA: Diagnosis not present

## 2022-08-28 DIAGNOSIS — Z79899 Other long term (current) drug therapy: Secondary | ICD-10-CM | POA: Diagnosis not present

## 2022-08-28 DIAGNOSIS — Z08 Encounter for follow-up examination after completed treatment for malignant neoplasm: Secondary | ICD-10-CM | POA: Diagnosis not present

## 2022-08-28 DIAGNOSIS — N183 Chronic kidney disease, stage 3 unspecified: Secondary | ICD-10-CM | POA: Diagnosis not present

## 2022-08-28 LAB — CBC WITH DIFFERENTIAL/PLATELET
Abs Immature Granulocytes: 0.07 10*3/uL (ref 0.00–0.07)
Basophils Absolute: 0.1 10*3/uL (ref 0.0–0.1)
Basophils Relative: 1 %
Eosinophils Absolute: 0.3 10*3/uL (ref 0.0–0.5)
Eosinophils Relative: 3 %
HCT: 37 % (ref 36.0–46.0)
Hemoglobin: 11.5 g/dL — ABNORMAL LOW (ref 12.0–15.0)
Immature Granulocytes: 1 %
Lymphocytes Relative: 12 %
Lymphs Abs: 1.5 10*3/uL (ref 0.7–4.0)
MCH: 29.1 pg (ref 26.0–34.0)
MCHC: 31.1 g/dL (ref 30.0–36.0)
MCV: 93.7 fL (ref 80.0–100.0)
Monocytes Absolute: 0.8 10*3/uL (ref 0.1–1.0)
Monocytes Relative: 7 %
Neutro Abs: 9.8 10*3/uL — ABNORMAL HIGH (ref 1.7–7.7)
Neutrophils Relative %: 76 %
Platelets: 270 10*3/uL (ref 150–400)
RBC: 3.95 MIL/uL (ref 3.87–5.11)
RDW: 15.6 % — ABNORMAL HIGH (ref 11.5–15.5)
WBC: 12.6 10*3/uL — ABNORMAL HIGH (ref 4.0–10.5)
nRBC: 0 % (ref 0.0–0.2)

## 2022-08-28 LAB — COMPREHENSIVE METABOLIC PANEL
ALT: 21 U/L (ref 0–44)
AST: 31 U/L (ref 15–41)
Albumin: 3.3 g/dL — ABNORMAL LOW (ref 3.5–5.0)
Alkaline Phosphatase: 70 U/L (ref 38–126)
Anion gap: 10 (ref 5–15)
BUN: 26 mg/dL — ABNORMAL HIGH (ref 8–23)
CO2: 29 mmol/L (ref 22–32)
Calcium: 9.2 mg/dL (ref 8.9–10.3)
Chloride: 103 mmol/L (ref 98–111)
Creatinine, Ser: 1.39 mg/dL — ABNORMAL HIGH (ref 0.44–1.00)
GFR, Estimated: 39 mL/min — ABNORMAL LOW (ref >60)
Glucose, Bld: 91 mg/dL (ref 70–99)
Potassium: 4.7 mmol/L (ref 3.5–5.1)
Sodium: 142 mmol/L (ref 135–145)
Total Bilirubin: 0.5 mg/dL (ref 0.3–1.2)
Total Protein: 7.3 g/dL (ref 6.5–8.1)

## 2022-09-01 ENCOUNTER — Other Ambulatory Visit: Payer: Self-pay | Admitting: *Deleted

## 2022-09-01 NOTE — Telephone Encounter (Signed)
Call requesting a refill of Tamoxifen by daughter [name]. Looking at her chart, she had prescription sent 08/06/22 for #90 tabs with 3 refill. I called her back and had to leave message on her voice mail to call [facility] as we had recently in November sent prescription for 90 with 2 refills

## 2022-09-06 NOTE — Progress Notes (Signed)
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  She did have biopsy-proven invasive mammary carcinoma with lobular features in October 2020.She underwent right mastectomy on 08/09/19. Pathology revealed a 4.3 x 2.1 x 2.0 cm grade II invasive lobular carcinoma. 1 of 10 LNs positive for metastatic carcinoma. Tumor was ER/PR positive, HER2 1+, Pathologic stage mpT2 pN1a. Mammaprint was low risk. She received 3 week course of right breast radiation completed 03/08/20 and started tamoxifen in 2021.    History of JAK2 positive thrombocytosis diagnosed in 2001 and bone marrow biopsy done back then.  Records not available.  She was on Hydrea for a while but then that was discontinued in 2017 and platelet counts have remained stable less than 400.   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  Denies any breast concerns.  Tolerating tamoxifen well without any significant side effects.  ECOG PS- 1 Pain scale- 0   Review of systems- Review of Systems  Constitutional:  Positive for malaise/fatigue. Negative for chills, fever and weight loss.  HENT:  Negative for  congestion, ear discharge and nosebleeds.   Eyes:  Negative for blurred vision.  Respiratory:  Negative for cough, hemoptysis, sputum production, shortness of breath and wheezing.   Cardiovascular:  Negative for chest pain, palpitations, orthopnea and claudication.  Gastrointestinal:  Negative for abdominal pain, blood in stool, constipation, diarrhea, heartburn, melena, nausea and vomiting.  Genitourinary:  Negative for dysuria, flank pain, frequency, hematuria and urgency.  Musculoskeletal:  Negative for back pain, joint pain and myalgias.  Skin:  Negative for rash.  Neurological:  Negative for dizziness, tingling, focal weakness, seizures, weakness and headaches.  Endo/Heme/Allergies:  Does not bruise/bleed easily.  Psychiatric/Behavioral:  Negative for depression and suicidal ideas. The patient does not have insomnia.       Allergies  Allergen Reactions   Atorvastatin Shortness Of Breath    Tolerates pravastatin   Calcium Shortness Of Breath   Fluoxetine     Other reaction(s): Unknown   Hydrochlorothiazide     Other reaction(s): Unknown     Past Medical History:  Diagnosis Date   Alcohol use    Anemia    Anxiety    Atrial fibrillation (HCC)    B12 deficiency    Breast cancer ([place])    RT Lumpectomy c radiation 2012   Breast cancer (HCC)    CHF (congestive heart failure) (HCC)    Chronic diastolic CHF (congestive heart failure) (HCC)    Chronic kidney disease    Cirrhosis (HCC)    CKD (chronic kidney disease), stage III (HCC)    COPD (chronic obstructive pulmonary disease) (HCC)    Dementia ([place])    Depression    Family history of breast cancer    Family  history of esophageal cancer    Family history of multiple myeloma    GERD (gastroesophageal reflux disease)    Headache    Heart murmur    HLD (hyperlipidemia)    HTN (hypertension)    Hyperlipemia    Hyperlipidemia    Hypertension    Hypothyroid    Hypothyroidism    Iron (Fe) deficiency anemia     Monoclonal gammopathy    Pacemaker    Personal history of radiation therapy    Presence of permanent cardiac pacemaker    Shortness of breath dyspnea    Thrombocytopenia (HCC)    Vitamin D deficiency      Past Surgical History:  Procedure Laterality Date   ABDOMINAL HYSTERECTOMY     Partial   ABLATION     APPENDECTOMY     BICEPT TENODESIS Right 01/07/2018   Procedure: BICEPS TENODESIS;  Surgeon: Leim Fabry, MD;  Location: [facility];  Service: Orthopedics;  Laterality: Right;   BREAST BIOPSY Right 2012   BREAST BIOPSY Right 07/21/2019   venus clip, [place] Bx,positive   BREAST LUMPECTOMY Right 2012   positive/rad   BREAST SURGERY     CARDIAC SURGERY     CARDIAC VALVE REPLACEMENT     COLONOSCOPY  2012   ELECTROPHYSIOLOGIC STUDY N/A 01/23/2015   Procedure: CARDIOVERSION;  Surgeon: Corey Skains, MD;  Location: [facility];  Service: Cardiovascular;  Laterality: N/A;   ELECTROPHYSIOLOGIC STUDY N/A 05/27/2016   Procedure: CARDIOVERSION;  Surgeon: Corey Skains, MD;  Location: [facility];  Service: Cardiovascular;  Laterality: N/A;   ESOPHAGOGASTRODUODENOSCOPY  2012   EYE SURGERY     IR CT HEAD LTD  03/10/2020   IR PERCUTANEOUS ART THROMBECTOMY/INFUSION INTRACRANIAL INC DIAG ANGIO  03/10/2020   IR US GUIDE VASC ACCESS RIGHT  03/10/2020   MASTECTOMY Right    Mastectomy Right    MASTECTOMY WITH AXILLARY LYMPH NODE DISSECTION Right 08/09/2019   Procedure: MASTECTOMY WITH AXILLARY LYMPH NODE DISSECTION;  Surgeon: Robert Bellow, MD;  Location: [facility];  Service: General;  Laterality: Right;   MITRAL VALVE REPLACEMENT     mvp     PACEMAKER PLACEMENT N/A    RADIOLOGY WITH ANESTHESIA N/A 03/10/2020   Procedure: IR WITH ANESTHESIA;  Surgeon: Radiologist, Medication, MD;  Location: [place];  Service: Radiology;  Laterality: N/A;   REVERSE SHOULDER ARTHROPLASTY Right 01/07/2018   Procedure: REVERSE SHOULDER ARTHROPLASTY;  Surgeon: Leim Fabry, MD;  Location: [facility];  Service: Orthopedics;   Laterality: Right;   VSD REPAIR     VSD REPAIR N/A     Social History   Socioeconomic History   Marital status: Divorced    Spouse name: Not on file   Number of children: Not on file   Years of education: Not on file   Highest education level: Not on file  Occupational History   Not on file  Tobacco Use   Smoking status: Never   Smokeless tobacco: Never  Vaping Use   Vaping Use: Never used  Substance and Sexual Activity   Alcohol use: Not Currently   Drug use: Never   Sexual activity: Not Currently  Other Topics Concern   Not on file  Social History Narrative   ** Merged History Encounter **       Social Determinants of Health   Financial Resource Strain: Not on file  Food Insecurity: Not on file  Transportation Needs: Not on file  Physical Activity: Not on file  Stress: Not on  file  Social Connections: Not on file  Intimate Partner Violence: Not on file    Family History  Problem Relation Age of Onset   Multiple myeloma Father    Esophageal cancer Brother    Breast cancer Mother        never treated, dx. >50   Esophageal cancer Brother    Breast cancer Daughter 6   Cancer Daughter 50       rare blood cancer     Current Outpatient Medications:    ACETAMINOPHEN EXTRA STRENGTH 500 MG capsule, Take 1 Dose by mouth as needed., Disp: , Rfl:    ADVAIR DISKUS 100-50 MCG/DOSE AEPB, Inhale 1 puff into the lungs 2 (two) times daily., Disp: , Rfl:    B Complex-C (B-COMPLEX WITH VITAMIN C) tablet, Take 1 tablet by mouth daily., Disp: , Rfl:    donepezil (ARICEPT) 5 MG tablet, Take 5 mg by mouth at bedtime., Disp: , Rfl:    ELIQUIS 5 MG TABS tablet, Take 5 mg by mouth 2 (two) times daily., Disp: , Rfl:    ezetimibe (ZETIA) 10 MG tablet, Take 10 mg by mouth daily., Disp: , Rfl:    furosemide (LASIX) 20 MG tablet, Take 20 mg by mouth 2 (two) times daily., Disp: , Rfl:    ipratropium-albuterol (DUONEB) 0.5-2.5 (3) MG/3ML SOLN, Take 3 mLs by nebulization 3 (three) times  daily., Disp: , Rfl:    levothyroxine (SYNTHROID) 50 MCG tablet, Take 50 mcg by mouth daily before breakfast., Disp: , Rfl:    lisinopril (ZESTRIL) 5 MG tablet, TAKE (1) TABLET BY MOUTH EVERY DAY TO PROTECT KIDNEYS, Disp: , Rfl:    melatonin 5 MG TABS, Take 5 mg by mouth at bedtime., Disp: , Rfl:    memantine (NAMENDA) 10 MG tablet, Take 10 mg by mouth 2 (two) times daily., Disp: , Rfl:    Multiple Vitamin (MULTIVITAMIN) tablet, Take 1 tablet by mouth daily., Disp: , Rfl:    pantoprazole (PROTONIX) 40 MG tablet, Take 1 tablet (40 mg total) by mouth 2 (two) times daily., Disp: , Rfl:    potassium chloride (KLOR-CON) 10 MEQ tablet, Take 10 mEq by mouth 2 (two) times daily., Disp: , Rfl:    pravastatin (PRAVACHOL) 40 MG tablet, Take 40 mg by mouth at bedtime., Disp: , Rfl:    predniSONE (DELTASONE) 5 MG tablet, Take 5 mg by mouth daily with breakfast., Disp: , Rfl:    tamoxifen (NOLVADEX) 20 MG tablet, TAKE (1) TABLET BY MOUTH EVERY DAY, Disp: 90 tablet, Rfl: 3   ursodiol (ACTIGALL) 300 MG capsule, Take 2 capsules by mouth 2 (two) times daily., Disp: , Rfl:    venlafaxine XR (EFFEXOR-XR) 37.5 MG 24 hr capsule, Take 75 mg by mouth daily., Disp: , Rfl:    VENTOLIN HFA 108 (90 Base) MCG/ACT inhaler, Inhale 2 puffs into the lungs every 4 (four) hours as needed., Disp: , Rfl:    Vitamin D, Ergocalciferol, (DRISDOL) 1.25 MG (50000 UNIT) CAPS capsule, Take 50,000 Units by mouth every 14 (fourteen) days. On Sundays, Disp: , Rfl:   Physical exam:  Vitals:   08/28/22 1007  BP: (!) 129/54  Pulse: (!) 59  Resp: 16  Temp: (!) 96.9 F (36.1 C)  TempSrc: Tympanic  Weight: 169 lb 8 oz (76.9 kg)   Physical Exam Cardiovascular:     Rate and Rhythm: Normal rate and regular rhythm.     Heart sounds: Normal heart sounds.  Pulmonary:     Effort: Pulmonary  effort is normal.     Breath sounds: Normal breath sounds.  Abdominal:     General: Bowel sounds are normal.     Palpations: Abdomen is soft.  Skin:     General: Skin is warm and dry.  Neurological:     Mental Status: She is alert and oriented to person, place, and time.   Exam: Patient is s/p right mastectomy without reconstruction.  No evidence of chest wall recurrence.  No palpable bilateral axillary adenopathy.     Latest Ref Rng & Units 08/28/2022    9:33 AM  CMP  Glucose 70 - 99 mg/dL 91   BUN 8 - 23 mg/dL 26   Creatinine 0.44 - 1.00 mg/dL 1.39   Sodium 135 - 145 mmol/L 142   Potassium 3.5 - 5.1 mmol/L 4.7   Chloride 98 - 111 mmol/L 103   CO2 22 - 32 mmol/L 29   Calcium 8.9 - 10.3 mg/dL 9.2   Total Protein 6.5 - 8.1 g/dL 7.3   Total Bilirubin 0.3 - 1.2 mg/dL 0.5   Alkaline Phos 38 - 126 U/L 70   AST 15 - 41 U/L 31   ALT 0 - 44 U/L 21       Latest Ref Rng & Units 08/28/2022    9:33 AM  CBC  WBC 4.0 - 10.5 K/uL 12.6   Hemoglobin 12.0 - 15.0 g/dL 11.5   Hematocrit 36.0 - 46.0 % 37.0   Platelets 150 - 400 K/uL 270     No images are attached to the encounter.  No results found.   Assessment and plan- Patient is a 76 y.o. female who is here for follow-up of following issues:  Surveillance breast cancer: Patient will continue taking tamoxifen at this time.  She will need to take that for 2 more years.  Patient does have baseline osteo porosis but is not presently taking any bisphosphonates. 2.  MGUS:We will continue to monitor her free light chains every year.  Both kappa and lambda light chains elevated likely secondary to renal failure but the ratio remains normal.  No M protein detected on SPEP.  I will see her back in 6 months with labs 3.  JAK2 positive thrombocytosis: Off Hydrea since 2017.  Platelet counts remain less than 400.  No thromboembolic events.  Continue to monitor   Visit Diagnosis 1. MGUS (monoclonal gammopathy of unknown significance)   2. Encounter for follow-up surveillance of breast cancer   3. Encounter for monitoring tamoxifen therapy      Dr. Randa Evens, MD, MPH [facility] [phone] 09/06/2022 9:15 PM

## 2022-09-07 ENCOUNTER — Other Ambulatory Visit: Payer: Self-pay | Admitting: Nurse Practitioner

## 2022-09-07 DIAGNOSIS — Z170 Estrogen receptor positive status [ER+]: Secondary | ICD-10-CM

## 2022-09-07 MED ORDER — TAMOXIFEN CITRATE 20 MG PO TABS: ORAL_TABLET | ORAL | 3 refills | Status: DC

## 2022-09-11 IMAGING — CT CT CTA ABD/PEL W/CM AND/OR W/O CM
3 of 10 series · 12 of 46 positions shown, 17 images · IV contrast (omnipaque)
Comparison: 03/20/2013

CLINICAL DATA: Rectal bleeding, Dementia, recent fall

EXAM:
CTA ABDOMEN AND PELVIS WITHOUT AND WITH CONTRAST
TECHNIQUE: Multidetector CT imaging of the abdomen and pelvis was performed
using the standard protocol during bolus administration of
intravenous contrast. Multiplanar reconstructed images and MIPs were
obtained and reviewed to evaluate the vascular anatomy.
CONTRAST:  75mL OMNIPAQUE IOHEXOL 350 MG/ML SOLN

[Series 5: axial arterial · axial · arterial · 0.78mm/px · z∈[-385,-51]mm · 7 of 239 slices shown]
[im 24/239  soft-tissue]
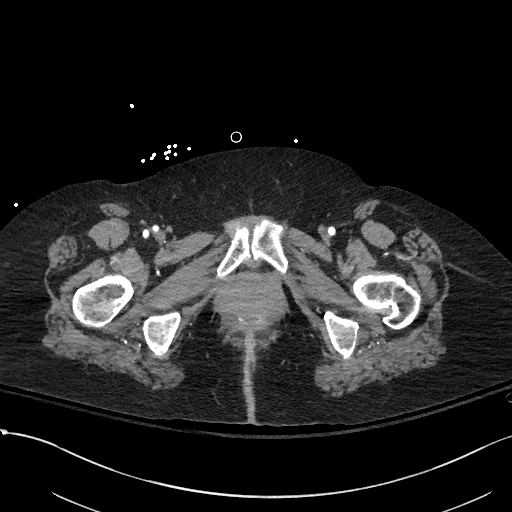
[im 48/239  soft-tissue]
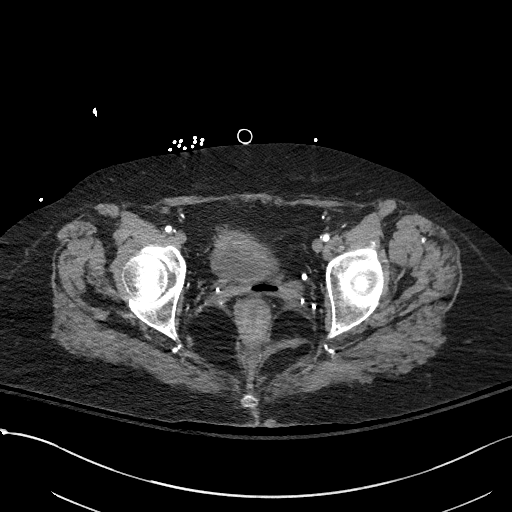
[im 72/239  soft-tissue]
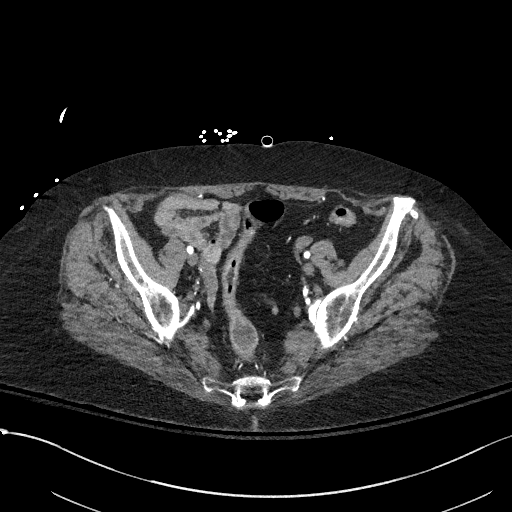
[im 96/239  soft-tissue]
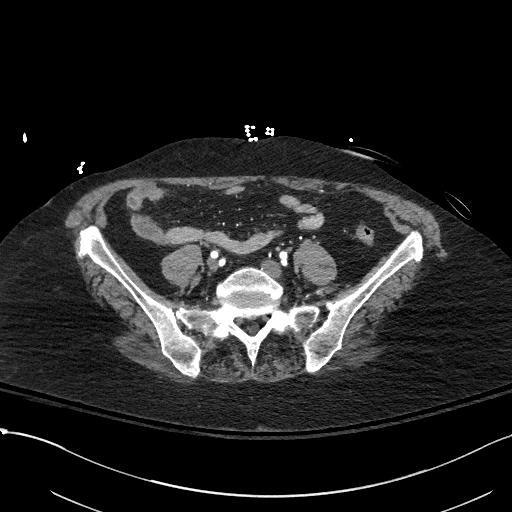
[im 143/239  soft-tissue]
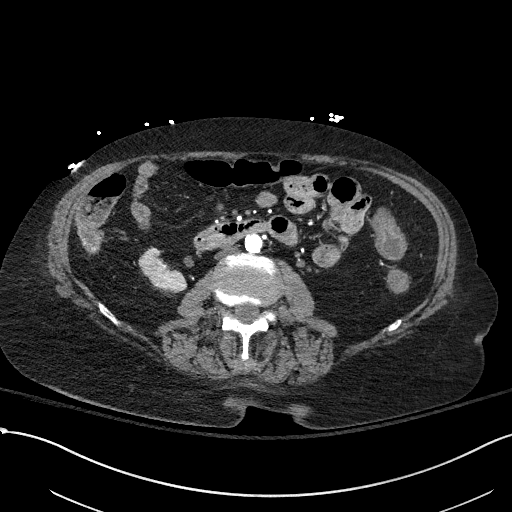
[im 167/239  soft-tissue]
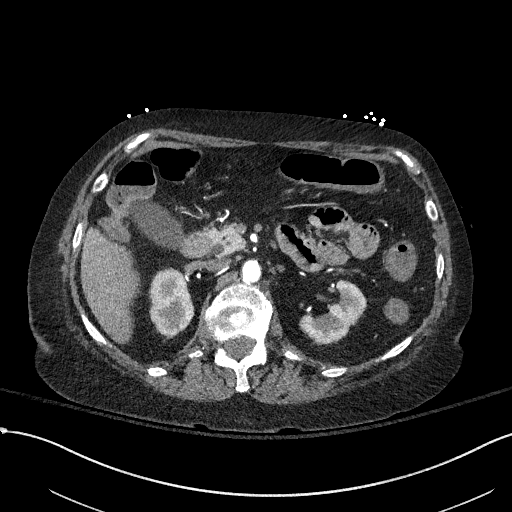
[im 191/239  soft-tissue]
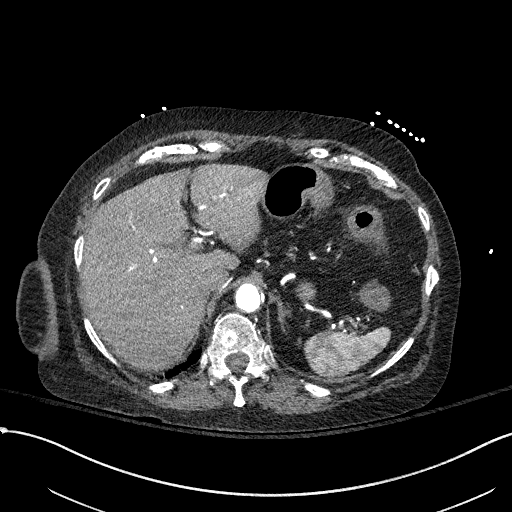

[Series 6: axial venous · axial · portal-venous · 0.78mm/px · z∈[-315,-75]mm · 3 of 96 slices shown, 7 images]
[im 24/96  soft-tissue]
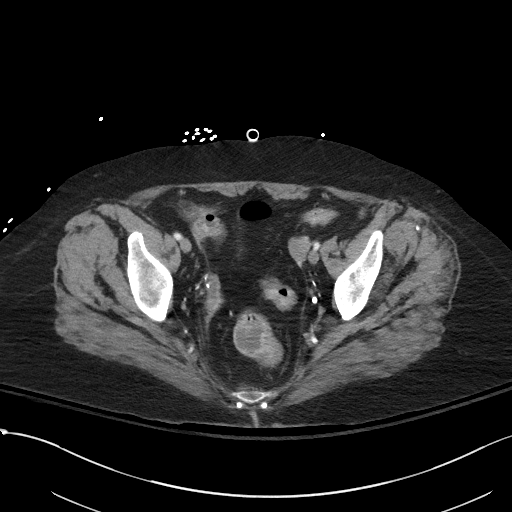
[im 24/96  lung]
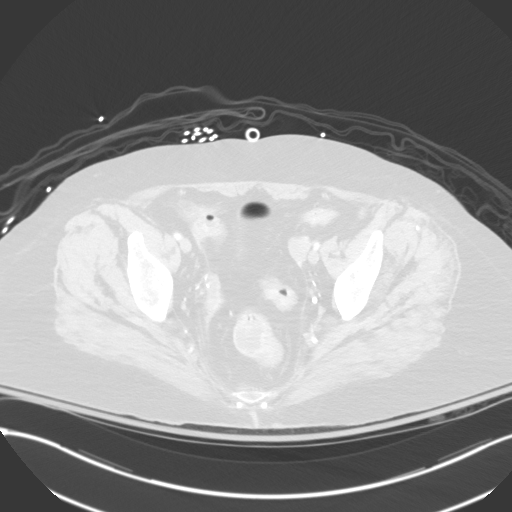
[im 24/96  bone]
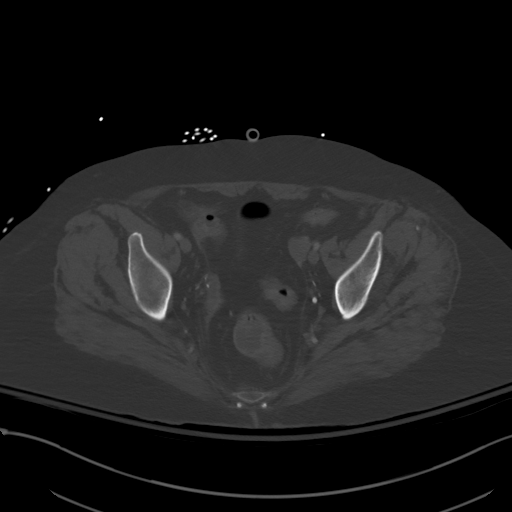
[im 48/96  soft-tissue]
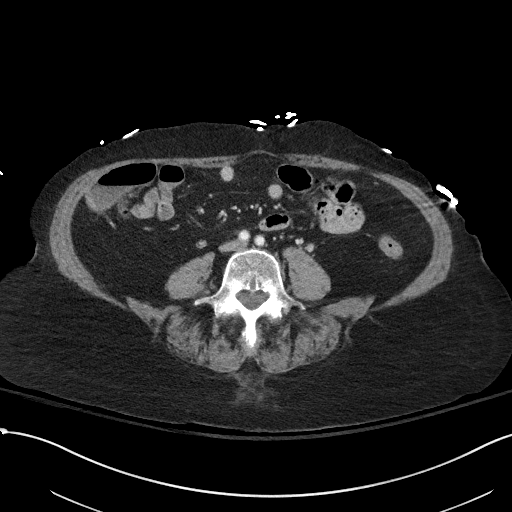
[im 48/96  lung]
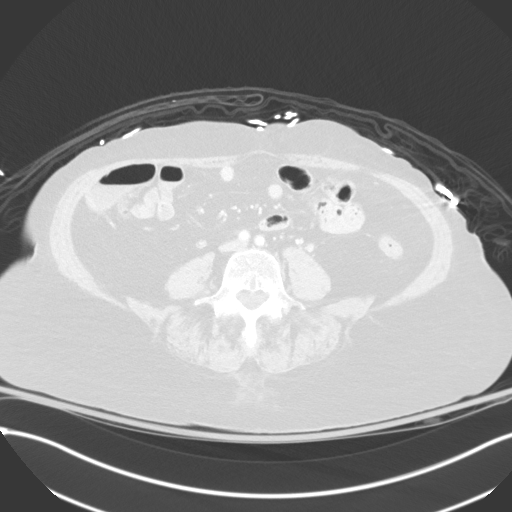
[im 72/96  soft-tissue]
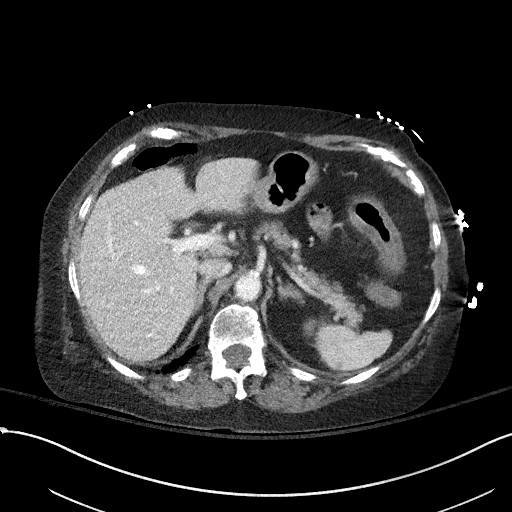
[im 72/96  lung]
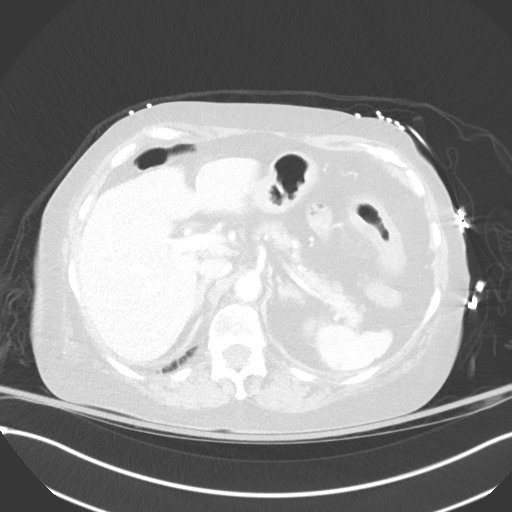

[Series 9: coronal mpr · coronal · 0.84mm/px · 2 of 130 slices shown, 3 images]
[im 44/130  soft-tissue]
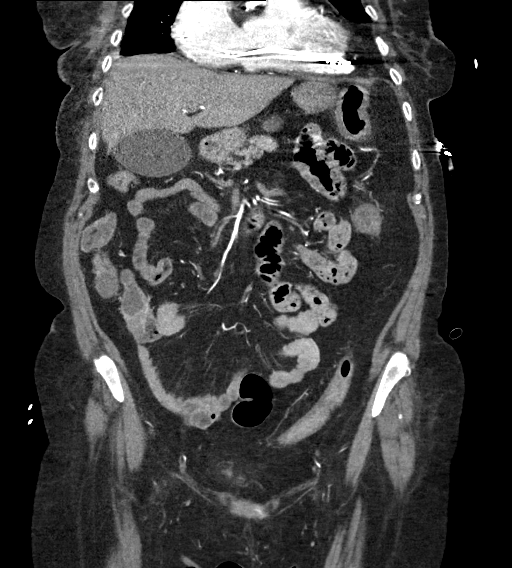
[im 44/130  bone]
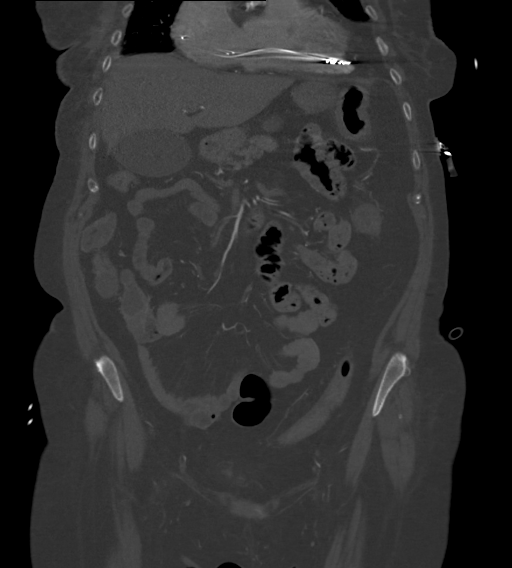
[im 87/130  soft-tissue]
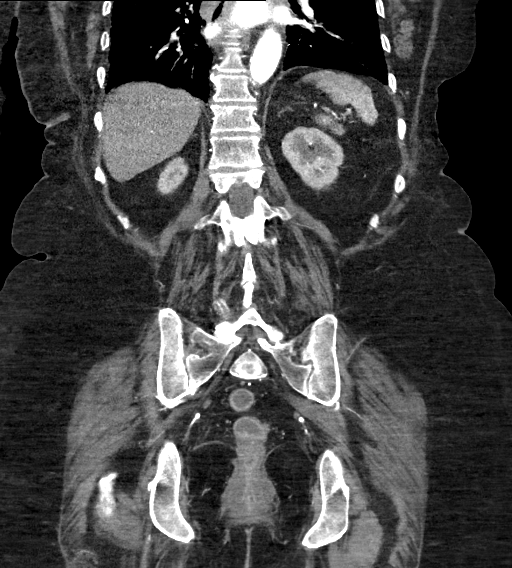

[12 of 46 positions shown; findings below may reference images not displayed]

FINDINGS: VASCULAR

Aorta: Moderate calcified atheromatous plaque. No aneurysm,
dissection, or stenosis.

Celiac: Calcified ostial plaque without stenosis, patent distally
within remarkable branch anatomy.

SMA: Ostial plaque extending over length of approximately 1.6 cm
resulting in moderate stenosis. Vessels patent distally with classic
branch anatomy.

Renals: Single left, with proximal branching. No stenosis.
Duplicated right, superior dominant, both patent.

IMA: Patent without evidence of aneurysm, dissection, vasculitis or
significant stenosis.

Inflow: Mild plaque in bilateral common iliac arteries without
stenosis. No dissection or aneurysm.

Proximal Outflow: Bilateral common femoral and visualized portions
of the superficial and profunda femoral arteries are patent without
evidence of aneurysm, dissection, vasculitis or significant
stenosis.

Veins: Patent hepatic veins, portal vein, SMV, splenic vein,
bilateral renal veins. Scattered mural calcifications in right
common femoral and common iliac veins suggesting sequela of previous
central venous catheter or DVT. IVC is nondistended, unremarkable.

Review of the MIP images confirms the above findings.

NON-VASCULAR

Lower chest: Cardiomegaly with biatrial enlargement. Transvenous
pacing leads partially visualized. Mitral valve replacement. No
pleural or pericardial effusion.

Hepatobiliary: No focal liver abnormality is seen. No gallstones,
gallbladder wall thickening, or biliary dilatation.

Pancreas: Unremarkable. No pancreatic ductal dilatation or
surrounding inflammatory changes.

Spleen: Normal in size without focal abnormality.

Adrenals/Urinary Tract: Adrenal glands unremarkable. Symmetric renal
enhancement. No hydronephrosis or urolithiasis. Urinary bladder
incompletely distended.

Stomach/Bowel: Stomach and small bowel are nondistended. Appendix
not identified. No pericecal inflammatory change. The colon is
nondilated. Suspect mild circumferential wall thickening in the
splenic flexure with mild adjacent inflammatory change. Scattered
sigmoid diverticula without adjacent inflammatory change. No
evidence of active extravasation.

Lymphatic: No abdominal or pelvic adenopathy.

Reproductive: Status post hysterectomy. No adnexal masses.

Other: Bilateral pelvic phleboliths.  No ascites.  No free air.

Musculoskeletal: Chronic T8 and L1 vertebral body compression
deformities stable since 07/27/2019. Stable grade 1 anterolisthesis
L4-5 without pars defect. No acute fracture or aggressive bone
lesion.
IMPRESSION: 1. No evidence of active GI bleeding.
2. Wall thickening in the splenic flexure of the colon suggesting
colitis, nonspecific. Consider GI consultation.
3. Sigmoid diverticulosis

## 2022-09-13 DIAGNOSIS — J449 Chronic obstructive pulmonary disease, unspecified: Secondary | ICD-10-CM | POA: Diagnosis not present

## 2022-09-25 DIAGNOSIS — Z79899 Other long term (current) drug therapy: Secondary | ICD-10-CM | POA: Diagnosis not present

## 2022-09-25 DIAGNOSIS — J449 Chronic obstructive pulmonary disease, unspecified: Secondary | ICD-10-CM | POA: Diagnosis not present

## 2022-09-25 DIAGNOSIS — N390 Urinary tract infection, site not specified: Secondary | ICD-10-CM | POA: Diagnosis not present

## 2022-10-14 DIAGNOSIS — J449 Chronic obstructive pulmonary disease, unspecified: Secondary | ICD-10-CM | POA: Diagnosis not present

## 2022-10-27 DIAGNOSIS — I5032 Chronic diastolic (congestive) heart failure: Secondary | ICD-10-CM | POA: Diagnosis not present

## 2022-11-14 DIAGNOSIS — J449 Chronic obstructive pulmonary disease, unspecified: Secondary | ICD-10-CM | POA: Diagnosis not present

## 2022-11-17 DIAGNOSIS — Z7401 Bed confinement status: Secondary | ICD-10-CM | POA: Diagnosis not present

## 2022-11-17 DIAGNOSIS — R4182 Altered mental status, unspecified: Secondary | ICD-10-CM | POA: Diagnosis not present

## 2022-11-17 DIAGNOSIS — D72829 Elevated white blood cell count, unspecified: Secondary | ICD-10-CM | POA: Diagnosis not present

## 2022-11-17 DIAGNOSIS — R0602 Shortness of breath: Secondary | ICD-10-CM | POA: Diagnosis not present

## 2022-11-17 DIAGNOSIS — J90 Pleural effusion, not elsewhere classified: Secondary | ICD-10-CM | POA: Diagnosis not present

## 2022-11-17 DIAGNOSIS — J811 Chronic pulmonary edema: Secondary | ICD-10-CM | POA: Diagnosis not present

## 2022-11-17 DIAGNOSIS — G9340 Encephalopathy, unspecified: Secondary | ICD-10-CM | POA: Diagnosis not present

## 2022-11-17 DIAGNOSIS — I050 Rheumatic mitral stenosis: Secondary | ICD-10-CM | POA: Diagnosis not present

## 2022-11-17 DIAGNOSIS — R918 Other nonspecific abnormal finding of lung field: Secondary | ICD-10-CM | POA: Diagnosis not present

## 2022-11-17 DIAGNOSIS — R630 Anorexia: Secondary | ICD-10-CM | POA: Diagnosis not present

## 2022-11-17 DIAGNOSIS — Z45018 Encounter for adjustment and management of other part of cardiac pacemaker: Secondary | ICD-10-CM | POA: Diagnosis not present

## 2022-11-17 DIAGNOSIS — I083 Combined rheumatic disorders of mitral, aortic and tricuspid valves: Secondary | ICD-10-CM | POA: Diagnosis not present

## 2022-11-17 DIAGNOSIS — R5381 Other malaise: Secondary | ICD-10-CM | POA: Diagnosis not present

## 2022-11-17 DIAGNOSIS — R627 Adult failure to thrive: Secondary | ICD-10-CM | POA: Diagnosis not present

## 2022-11-17 DIAGNOSIS — R778 Other specified abnormalities of plasma proteins: Secondary | ICD-10-CM | POA: Diagnosis not present

## 2022-11-17 DIAGNOSIS — U071 COVID-19: Secondary | ICD-10-CM | POA: Diagnosis not present

## 2022-11-17 DIAGNOSIS — R531 Weakness: Secondary | ICD-10-CM | POA: Diagnosis not present

## 2022-11-17 DIAGNOSIS — J449 Chronic obstructive pulmonary disease, unspecified: Secondary | ICD-10-CM | POA: Diagnosis not present

## 2022-11-17 DIAGNOSIS — R9431 Abnormal electrocardiogram [ECG] [EKG]: Secondary | ICD-10-CM | POA: Diagnosis not present

## 2022-11-17 DIAGNOSIS — R2689 Other abnormalities of gait and mobility: Secondary | ICD-10-CM | POA: Diagnosis not present

## 2022-11-17 DIAGNOSIS — Z20822 Contact with and (suspected) exposure to covid-19: Secondary | ICD-10-CM | POA: Diagnosis not present

## 2022-11-17 DIAGNOSIS — M6281 Muscle weakness (generalized): Secondary | ICD-10-CM | POA: Diagnosis not present

## 2022-11-17 DIAGNOSIS — R7989 Other specified abnormal findings of blood chemistry: Secondary | ICD-10-CM | POA: Diagnosis not present

## 2022-11-17 DIAGNOSIS — N179 Acute kidney failure, unspecified: Secondary | ICD-10-CM | POA: Diagnosis not present

## 2022-11-26 DIAGNOSIS — J449 Chronic obstructive pulmonary disease, unspecified: Secondary | ICD-10-CM | POA: Diagnosis not present

## 2022-11-26 DIAGNOSIS — I5032 Chronic diastolic (congestive) heart failure: Secondary | ICD-10-CM | POA: Diagnosis not present

## 2022-11-26 DIAGNOSIS — I342 Nonrheumatic mitral (valve) stenosis: Secondary | ICD-10-CM | POA: Diagnosis not present

## 2022-11-26 DIAGNOSIS — U071 COVID-19: Secondary | ICD-10-CM | POA: Diagnosis not present

## 2022-11-30 ENCOUNTER — Inpatient Hospital Stay
Admission: EM | Admit: 2022-11-30 | Discharge: 2022-12-04 | DRG: 291 | Disposition: A | Discharge status: Skilled Nursing Facility | Payer: Medicare Other | Source: Skilled Nursing Facility | Attending: Hospitalist | Admitting: Hospitalist

## 2022-11-30 ENCOUNTER — Encounter: Payer: Self-pay | Admitting: Internal Medicine

## 2022-11-30 ENCOUNTER — Emergency Department: Payer: Medicare Other

## 2022-11-30 ENCOUNTER — Other Ambulatory Visit: Payer: Self-pay

## 2022-11-30 ENCOUNTER — Inpatient Hospital Stay: Payer: Medicare Other

## 2022-11-30 DIAGNOSIS — F0390 Unspecified dementia without behavioral disturbance: Secondary | ICD-10-CM | POA: Diagnosis present

## 2022-11-30 DIAGNOSIS — I700 Atherosclerosis of aorta: Secondary | ICD-10-CM | POA: Diagnosis present

## 2022-11-30 DIAGNOSIS — J9611 Chronic respiratory failure with hypoxia: Secondary | ICD-10-CM | POA: Diagnosis present

## 2022-11-30 DIAGNOSIS — I130 Hypertensive heart and chronic kidney disease with heart failure and stage 1 through stage 4 chronic kidney disease, or unspecified chronic kidney disease: Secondary | ICD-10-CM | POA: Diagnosis present

## 2022-11-30 DIAGNOSIS — R6520 Severe sepsis without septic shock: Secondary | ICD-10-CM | POA: Diagnosis present

## 2022-11-30 DIAGNOSIS — N281 Cyst of kidney, acquired: Secondary | ICD-10-CM | POA: Diagnosis not present

## 2022-11-30 DIAGNOSIS — N1832 Chronic kidney disease, stage 3b: Secondary | ICD-10-CM | POA: Diagnosis present

## 2022-11-30 DIAGNOSIS — E876 Hypokalemia: Secondary | ICD-10-CM | POA: Diagnosis present

## 2022-11-30 DIAGNOSIS — Z7901 Long term (current) use of anticoagulants: Secondary | ICD-10-CM

## 2022-11-30 DIAGNOSIS — Z888 Allergy status to other drugs, medicaments and biological substances status: Secondary | ICD-10-CM

## 2022-11-30 DIAGNOSIS — K743 Primary biliary cirrhosis: Secondary | ICD-10-CM | POA: Diagnosis present

## 2022-11-30 DIAGNOSIS — Z950 Presence of cardiac pacemaker: Secondary | ICD-10-CM

## 2022-11-30 DIAGNOSIS — Z923 Personal history of irradiation: Secondary | ICD-10-CM

## 2022-11-30 DIAGNOSIS — N390 Urinary tract infection, site not specified: Secondary | ICD-10-CM | POA: Diagnosis present

## 2022-11-30 DIAGNOSIS — N1831 Chronic kidney disease, stage 3a: Secondary | ICD-10-CM | POA: Diagnosis not present

## 2022-11-30 DIAGNOSIS — K219 Gastro-esophageal reflux disease without esophagitis: Secondary | ICD-10-CM | POA: Diagnosis present

## 2022-11-30 DIAGNOSIS — Z7952 Long term (current) use of systemic steroids: Secondary | ICD-10-CM

## 2022-11-30 DIAGNOSIS — Z66 Do not resuscitate: Secondary | ICD-10-CM | POA: Diagnosis present

## 2022-11-30 DIAGNOSIS — Z952 Presence of prosthetic heart valve: Secondary | ICD-10-CM | POA: Diagnosis not present

## 2022-11-30 DIAGNOSIS — I5033 Acute on chronic diastolic (congestive) heart failure: Secondary | ICD-10-CM | POA: Diagnosis present

## 2022-11-30 DIAGNOSIS — Z79890 Hormone replacement therapy (postmenopausal): Secondary | ICD-10-CM | POA: Diagnosis not present

## 2022-11-30 DIAGNOSIS — K5730 Diverticulosis of large intestine without perforation or abscess without bleeding: Secondary | ICD-10-CM | POA: Diagnosis not present

## 2022-11-30 DIAGNOSIS — R404 Transient alteration of awareness: Secondary | ICD-10-CM | POA: Diagnosis not present

## 2022-11-30 DIAGNOSIS — R0902 Hypoxemia: Secondary | ICD-10-CM | POA: Diagnosis not present

## 2022-11-30 DIAGNOSIS — E039 Hypothyroidism, unspecified: Secondary | ICD-10-CM | POA: Diagnosis present

## 2022-11-30 DIAGNOSIS — R4182 Altered mental status, unspecified: Secondary | ICD-10-CM | POA: Diagnosis not present

## 2022-11-30 DIAGNOSIS — J449 Chronic obstructive pulmonary disease, unspecified: Secondary | ICD-10-CM | POA: Diagnosis present

## 2022-11-30 DIAGNOSIS — I5A Non-ischemic myocardial injury (non-traumatic): Secondary | ICD-10-CM | POA: Diagnosis present

## 2022-11-30 DIAGNOSIS — I4891 Unspecified atrial fibrillation: Secondary | ICD-10-CM | POA: Diagnosis present

## 2022-11-30 DIAGNOSIS — Z8673 Personal history of transient ischemic attack (TIA), and cerebral infarction without residual deficits: Secondary | ICD-10-CM | POA: Diagnosis not present

## 2022-11-30 DIAGNOSIS — Z807 Family history of other malignant neoplasms of lymphoid, hematopoietic and related tissues: Secondary | ICD-10-CM

## 2022-11-30 DIAGNOSIS — Z8616 Personal history of COVID-19: Secondary | ICD-10-CM | POA: Diagnosis not present

## 2022-11-30 DIAGNOSIS — Z853 Personal history of malignant neoplasm of breast: Secondary | ICD-10-CM

## 2022-11-30 DIAGNOSIS — J90 Pleural effusion, not elsewhere classified: Secondary | ICD-10-CM | POA: Diagnosis not present

## 2022-11-30 DIAGNOSIS — R6510 Systemic inflammatory response syndrome (SIRS) of non-infectious origin without acute organ dysfunction: Secondary | ICD-10-CM | POA: Diagnosis present

## 2022-11-30 DIAGNOSIS — Z1152 Encounter for screening for COVID-19: Secondary | ICD-10-CM

## 2022-11-30 DIAGNOSIS — R109 Unspecified abdominal pain: Secondary | ICD-10-CM | POA: Diagnosis not present

## 2022-11-30 DIAGNOSIS — A419 Sepsis, unspecified organism: Secondary | ICD-10-CM | POA: Diagnosis present

## 2022-11-30 DIAGNOSIS — N3000 Acute cystitis without hematuria: Secondary | ICD-10-CM

## 2022-11-30 DIAGNOSIS — I10 Essential (primary) hypertension: Secondary | ICD-10-CM | POA: Diagnosis not present

## 2022-11-30 DIAGNOSIS — E785 Hyperlipidemia, unspecified: Secondary | ICD-10-CM | POA: Diagnosis present

## 2022-11-30 DIAGNOSIS — Z9981 Dependence on supplemental oxygen: Secondary | ICD-10-CM | POA: Diagnosis not present

## 2022-11-30 DIAGNOSIS — R0689 Other abnormalities of breathing: Secondary | ICD-10-CM | POA: Diagnosis not present

## 2022-11-30 DIAGNOSIS — C50919 Malignant neoplasm of unspecified site of unspecified female breast: Secondary | ICD-10-CM | POA: Diagnosis present

## 2022-11-30 DIAGNOSIS — Z803 Family history of malignant neoplasm of breast: Secondary | ICD-10-CM

## 2022-11-30 DIAGNOSIS — Z9011 Acquired absence of right breast and nipple: Secondary | ICD-10-CM

## 2022-11-30 DIAGNOSIS — Z800 Family history of malignant neoplasm of digestive organs: Secondary | ICD-10-CM

## 2022-11-30 DIAGNOSIS — R1310 Dysphagia, unspecified: Secondary | ICD-10-CM | POA: Diagnosis present

## 2022-11-30 DIAGNOSIS — Z7951 Long term (current) use of inhaled steroids: Secondary | ICD-10-CM

## 2022-11-30 DIAGNOSIS — R000 Tachycardia, unspecified: Secondary | ICD-10-CM | POA: Diagnosis not present

## 2022-11-30 DIAGNOSIS — Z79899 Other long term (current) drug therapy: Secondary | ICD-10-CM

## 2022-11-30 LAB — URINALYSIS, W/ REFLEX TO CULTURE (INFECTION SUSPECTED)
Bilirubin Urine: NEGATIVE
Glucose, UA: NEGATIVE mg/dL
Hgb urine dipstick: NEGATIVE
Ketones, ur: NEGATIVE mg/dL
Nitrite: NEGATIVE
Protein, ur: 100 mg/dL — AB
Specific Gravity, Urine: 1.018 (ref 1.005–1.030)
WBC, UA: >50 WBC/hpf (ref 0–5)
pH: 5 (ref 5.0–8.0)

## 2022-11-30 LAB — CBC WITH DIFFERENTIAL/PLATELET
Abs Immature Granulocytes: 0.15 10*3/uL — ABNORMAL HIGH (ref 0.00–0.07)
Basophils Absolute: 0.1 10*3/uL (ref 0.0–0.1)
Basophils Relative: 1 %
Eosinophils Absolute: 0.1 10*3/uL (ref 0.0–0.5)
Eosinophils Relative: 0 %
HCT: 37.7 % (ref 36.0–46.0)
Hemoglobin: 11.5 g/dL — ABNORMAL LOW (ref 12.0–15.0)
Immature Granulocytes: 1 %
Lymphocytes Relative: 11 %
Lymphs Abs: 2.3 10*3/uL (ref 0.7–4.0)
MCH: 28 pg (ref 26.0–34.0)
MCHC: 30.5 g/dL (ref 30.0–36.0)
MCV: 92 fL (ref 80.0–100.0)
Monocytes Absolute: 1.3 10*3/uL — ABNORMAL HIGH (ref 0.1–1.0)
Monocytes Relative: 6 %
Neutro Abs: 16.7 10*3/uL — ABNORMAL HIGH (ref 1.7–7.7)
Neutrophils Relative %: 81 %
Platelets: 255 10*3/uL (ref 150–400)
RBC: 4.1 MIL/uL (ref 3.87–5.11)
RDW: 15.3 % (ref 11.5–15.5)
WBC: 20.7 10*3/uL — ABNORMAL HIGH (ref 4.0–10.5)
nRBC: 0 % (ref 0.0–0.2)

## 2022-11-30 LAB — LACTIC ACID, PLASMA
Lactic Acid, Venous: >9 mmol/L (ref 0.5–1.9)
Lactic Acid, Venous: 1.5 mmol/L (ref 0.5–1.9)

## 2022-11-30 LAB — COMPREHENSIVE METABOLIC PANEL
ALT: 22 U/L (ref 0–44)
AST: 52 U/L — ABNORMAL HIGH (ref 15–41)
Albumin: 2.9 g/dL — ABNORMAL LOW (ref 3.5–5.0)
Alkaline Phosphatase: 69 U/L (ref 38–126)
Anion gap: 25 — ABNORMAL HIGH (ref 5–15)
BUN: 17 mg/dL (ref 8–23)
CO2: 15 mmol/L — ABNORMAL LOW (ref 22–32)
Calcium: 8.7 mg/dL — ABNORMAL LOW (ref 8.9–10.3)
Chloride: 97 mmol/L — ABNORMAL LOW (ref 98–111)
Creatinine, Ser: 1.36 mg/dL — ABNORMAL HIGH (ref 0.44–1.00)
GFR, Estimated: 40 mL/min — ABNORMAL LOW (ref >60)
Glucose, Bld: 161 mg/dL — ABNORMAL HIGH (ref 70–99)
Potassium: 3.8 mmol/L (ref 3.5–5.1)
Sodium: 137 mmol/L (ref 135–145)
Total Bilirubin: 0.8 mg/dL (ref 0.3–1.2)
Total Protein: 7.2 g/dL (ref 6.5–8.1)

## 2022-11-30 LAB — PROCALCITONIN: Procalcitonin: 0.29 ng/mL

## 2022-11-30 LAB — PROTIME-INR
INR: 1.3 — ABNORMAL HIGH (ref 0.8–1.2)
Prothrombin Time: 15.6 seconds — ABNORMAL HIGH (ref 11.4–15.2)

## 2022-11-30 LAB — TROPONIN I (HIGH SENSITIVITY)
Troponin I (High Sensitivity): 91 ng/L — ABNORMAL HIGH (ref <18)
Troponin I (High Sensitivity): 100 ng/L (ref <18)
Troponin I (High Sensitivity): 79 ng/L — ABNORMAL HIGH (ref <18)
Troponin I (High Sensitivity): 74 ng/L — ABNORMAL HIGH (ref <18)

## 2022-11-30 LAB — AMMONIA: Ammonia: 15 umol/L (ref 9–35)

## 2022-11-30 LAB — RESP PANEL BY RT-PCR (RSV, FLU A&B, COVID)  RVPGX2
Influenza A by PCR: NEGATIVE
Influenza B by PCR: NEGATIVE
Resp Syncytial Virus by PCR: NEGATIVE
SARS Coronavirus 2 by RT PCR: NEGATIVE

## 2022-11-30 LAB — BRAIN NATRIURETIC PEPTIDE: B Natriuretic Peptide: 1044.5 pg/mL — ABNORMAL HIGH (ref 0.0–100.0)

## 2022-11-30 LAB — APTT: aPTT: 26 seconds (ref 24–36)

## 2022-11-30 LAB — MAGNESIUM: Magnesium: 1.9 mg/dL (ref 1.7–2.4)

## 2022-11-30 MED ORDER — PRAVASTATIN SODIUM 20 MG PO TABS
40.0000 mg | ORAL_TABLET | Freq: Every day | ORAL | Status: DC
  Administered 2022-12-02 – 2022-12-03 (×2): 40 mg via ORAL
  Filled 2022-11-30 (×2): qty 2

## 2022-11-30 MED ORDER — FUROSEMIDE 10 MG/ML IJ SOLN
40.0000 mg | Freq: Two times a day (BID) | INTRAMUSCULAR | Status: DC
  Administered 2022-12-01: 40 mg via INTRAVENOUS
  Filled 2022-11-30: qty 4

## 2022-11-30 MED ORDER — ACETAMINOPHEN 325 MG RE SUPP
650.0000 mg | Freq: Once | RECTAL | Status: AC
  Administered 2022-11-30: 650 mg via RECTAL
  Filled 2022-11-30: qty 2

## 2022-11-30 MED ORDER — FUROSEMIDE 10 MG/ML IJ SOLN
40.0000 mg | Freq: Once | INTRAMUSCULAR | Status: AC
  Administered 2022-11-30: 40 mg via INTRAVENOUS
  Filled 2022-11-30: qty 4

## 2022-11-30 MED ORDER — SODIUM CHLORIDE 0.9 % IV SOLN
2.0000 g | INTRAVENOUS | Status: DC
  Administered 2022-12-01 – 2022-12-04 (×4): 2 g via INTRAVENOUS
  Filled 2022-11-30: qty 2
  Filled 2022-11-30: qty 20
  Filled 2022-11-30: qty 2
  Filled 2022-11-30: qty 20
  Filled 2022-11-30: qty 2

## 2022-11-30 MED ORDER — LEVOTHYROXINE SODIUM 50 MCG PO TABS
50.0000 ug | ORAL_TABLET | Freq: Every day | ORAL | Status: DC
  Administered 2022-12-01 – 2022-12-04 (×3): 50 ug via ORAL
  Filled 2022-11-30 (×3): qty 1

## 2022-11-30 MED ORDER — MEMANTINE HCL 5 MG PO TABS
5.0000 mg | ORAL_TABLET | Freq: Two times a day (BID) | ORAL | Status: DC
  Administered 2022-12-02 – 2022-12-04 (×5): 5 mg via ORAL
  Filled 2022-11-30 (×6): qty 1

## 2022-11-30 MED ORDER — HYDROCORTISONE SOD SUC (PF) 100 MG IJ SOLR
100.0000 mg | Freq: Three times a day (TID) | INTRAMUSCULAR | Status: DC
  Administered 2022-11-30 (×2): 100 mg via INTRAVENOUS
  Filled 2022-11-30 (×4): qty 2

## 2022-11-30 MED ORDER — LACTATED RINGERS IV BOLUS: 1000.0000 mL | Freq: Once | INTRAVENOUS | Status: DC

## 2022-11-30 MED ORDER — SODIUM CHLORIDE 0.9 % IV BOLUS
1000.0000 mL | Freq: Once | INTRAVENOUS | Status: AC
  Administered 2022-11-30: 1000 mL via INTRAVENOUS

## 2022-11-30 MED ORDER — PREDNISONE 20 MG PO TABS
20.0000 mg | ORAL_TABLET | Freq: Every day | ORAL | Status: DC
  Filled 2022-11-30: qty 1

## 2022-11-30 MED ORDER — HYDRALAZINE HCL 20 MG/ML IJ SOLN: 5.0000 mg | INTRAMUSCULAR | Status: DC | PRN

## 2022-11-30 MED ORDER — ALBUTEROL SULFATE (2.5 MG/3ML) 0.083% IN NEBU
2.5000 mg | INHALATION_SOLUTION | RESPIRATORY_TRACT | Status: DC | PRN
  Administered 2022-12-04: 2.5 mg via RESPIRATORY_TRACT
  Filled 2022-11-30: qty 3

## 2022-11-30 MED ORDER — MOMETASONE FURO-FORMOTEROL FUM 100-5 MCG/ACT IN AERO
2.0000 | INHALATION_SPRAY | Freq: Two times a day (BID) | RESPIRATORY_TRACT | Status: DC
  Administered 2022-11-30 – 2022-12-04 (×6): 2 via RESPIRATORY_TRACT
  Filled 2022-11-30 (×2): qty 8.8

## 2022-11-30 MED ORDER — ADULT MULTIVITAMIN W/MINERALS CH
1.0000 | ORAL_TABLET | Freq: Every day | ORAL | Status: DC
  Administered 2022-12-02 – 2022-12-04 (×3): 1 via ORAL
  Filled 2022-11-30 (×3): qty 1

## 2022-11-30 MED ORDER — SODIUM CHLORIDE 0.9 % IV SOLN: INTRAVENOUS | Status: DC

## 2022-11-30 MED ORDER — ACETAMINOPHEN 650 MG RE SUPP: 650.0000 mg | Freq: Four times a day (QID) | RECTAL | Status: DC | PRN

## 2022-11-30 MED ORDER — TAMOXIFEN CITRATE 10 MG PO TABS
20.0000 mg | ORAL_TABLET | Freq: Every day | ORAL | Status: DC
  Administered 2022-12-02 – 2022-12-04 (×3): 20 mg via ORAL
  Filled 2022-11-30 (×4): qty 2

## 2022-11-30 MED ORDER — SODIUM CHLORIDE 0.9 % IV SOLN
2.0000 g | Freq: Once | INTRAVENOUS | Status: AC
  Administered 2022-11-30: 2 g via INTRAVENOUS
  Filled 2022-11-30: qty 12.5

## 2022-11-30 MED ORDER — VENLAFAXINE HCL ER 75 MG PO CP24
75.0000 mg | ORAL_CAPSULE | Freq: Every day | ORAL | Status: DC
  Administered 2022-12-02 – 2022-12-04 (×3): 75 mg via ORAL
  Filled 2022-11-30 (×4): qty 1

## 2022-11-30 MED ORDER — IPRATROPIUM-ALBUTEROL 0.5-2.5 (3) MG/3ML IN SOLN
3.0000 mL | Freq: Three times a day (TID) | RESPIRATORY_TRACT | Status: DC
  Administered 2022-12-01 – 2022-12-03 (×7): 3 mL via RESPIRATORY_TRACT
  Filled 2022-11-30 (×7): qty 3

## 2022-11-30 MED ORDER — METRONIDAZOLE 500 MG/100ML IV SOLN
500.0000 mg | Freq: Once | INTRAVENOUS | Status: AC
  Administered 2022-11-30: 500 mg via INTRAVENOUS
  Filled 2022-11-30: qty 100

## 2022-11-30 MED ORDER — ONDANSETRON HCL 4 MG/2ML IJ SOLN: 4.0000 mg | Freq: Three times a day (TID) | INTRAMUSCULAR | Status: DC | PRN

## 2022-11-30 MED ORDER — PANTOPRAZOLE SODIUM 40 MG PO TBEC
40.0000 mg | DELAYED_RELEASE_TABLET | Freq: Two times a day (BID) | ORAL | Status: DC
  Administered 2022-11-30: 40 mg via ORAL
  Filled 2022-11-30 (×3): qty 1

## 2022-11-30 MED ORDER — MELATONIN 5 MG PO TABS
5.0000 mg | ORAL_TABLET | Freq: Every day | ORAL | Status: DC
  Administered 2022-11-30 – 2022-12-03 (×3): 5 mg via ORAL
  Filled 2022-11-30 (×3): qty 1

## 2022-11-30 MED ORDER — IPRATROPIUM-ALBUTEROL 0.5-2.5 (3) MG/3ML IN SOLN
3.0000 mL | RESPIRATORY_TRACT | Status: DC
  Administered 2022-11-30 (×2): 3 mL via RESPIRATORY_TRACT
  Filled 2022-11-30 (×2): qty 3

## 2022-11-30 MED ORDER — URSODIOL 300 MG PO CAPS
600.0000 mg | ORAL_CAPSULE | Freq: Two times a day (BID) | ORAL | Status: DC
  Administered 2022-12-02 – 2022-12-04 (×5): 600 mg via ORAL
  Filled 2022-11-30 (×8): qty 2

## 2022-11-30 MED ORDER — METRONIDAZOLE 500 MG/100ML IV SOLN: 500.0000 mg | Freq: Two times a day (BID) | INTRAVENOUS | Status: DC

## 2022-11-30 MED ORDER — EZETIMIBE 10 MG PO TABS
10.0000 mg | ORAL_TABLET | Freq: Every day | ORAL | Status: DC
  Administered 2022-12-02 – 2022-12-04 (×3): 10 mg via ORAL
  Filled 2022-11-30 (×4): qty 1

## 2022-11-30 MED ORDER — LACTATED RINGERS IV BOLUS
1000.0000 mL | Freq: Once | INTRAVENOUS | Status: AC
  Administered 2022-11-30: 1000 mL via INTRAVENOUS

## 2022-11-30 MED ORDER — APIXABAN 5 MG PO TABS
5.0000 mg | ORAL_TABLET | Freq: Two times a day (BID) | ORAL | Status: DC
  Administered 2022-11-30 – 2022-12-04 (×6): 5 mg via ORAL
  Filled 2022-11-30 (×7): qty 1

## 2022-11-30 MED ORDER — DM-GUAIFENESIN ER 30-600 MG PO TB12: 1.0000 | ORAL_TABLET | Freq: Two times a day (BID) | ORAL | Status: DC | PRN

## 2022-11-30 MED ORDER — ACETAMINOPHEN 325 MG PO TABS
650.0000 mg | ORAL_TABLET | Freq: Four times a day (QID) | ORAL | Status: DC | PRN
  Administered 2022-12-02: 650 mg via ORAL
  Filled 2022-11-30: qty 2

## 2022-11-30 MED ORDER — LACTATED RINGERS IV BOLUS: 500.0000 mL | Freq: Once | INTRAVENOUS | Status: DC

## 2022-11-30 MED ORDER — VANCOMYCIN HCL IN DEXTROSE 1-5 GM/200ML-% IV SOLN
1000.0000 mg | Freq: Once | INTRAVENOUS | Status: AC
  Administered 2022-11-30: 1000 mg via INTRAVENOUS
  Filled 2022-11-30: qty 200

## 2022-11-30 NOTE — Progress Notes (Signed)
PHARMACY -  BRIEF ANTIBIOTIC NOTE   Pharmacy has received consult(s) for vancomycin and cefepime from an ED provider.  The patient's profile has been reviewed for ht/wt/allergies/indication/available labs.    One time order(s) placed for vancomycin 1,000 mg x 1 and cefepime 2 grams x 1  Further antibiotics/pharmacy consults should be ordered by admitting physician if indicated.                       Thank you, Wynelle Cleveland 11/30/2022  9:31 AM

## 2022-11-30 NOTE — TOC Initial Note (Signed)
Transition of Care [facility]) - Initial/Assessment Note    Patient Details  Name: Adriana Martin MRN: [id number] Date of Birth: 16-Dec-1945  Transition of Care [facility]) CM/SW Contact:    Tiburcio Bash, LCSW Phone Number: 11/30/2022, 10:52 AM  Clinical Narrative:                  CSW spoke with Tammy at [facility] who confirms patient is long term at their facility.   TOC will follow for needs.  Expected Discharge Plan: Skilled Nursing Facility Barriers to Discharge: Continued Medical Work up   Patient Goals and CMS Choice   CMS Medicare.gov Compare Post Acute Care list provided to:: Patient Represenative (must comment) (daughter) Choice offered to / list presented to : Adult Children      Expected Discharge Plan and Services       Living arrangements for the past 2 months: [place]                                      Prior Living Arrangements/Services Living arrangements for the past 2 months: Uncertain Lives with:: Facility Resident ([facility])                   Activities of Daily Living      Permission Sought/Granted                  Emotional Assessment              Admission diagnosis:  Seizures Patient Active Problem List   Diagnosis Date Noted   Diarrhea 01/12/2022   Severe sepsis ([place]) 01/10/2022   Laceration of left forearm 01/10/2022   Severe sepsis with acute organ dysfunction ([place]) 01/09/2022   Weakness    Acute respiratory failure with hypoxia (HCC)    Cardiac arrest (HCC)    Epigastric pain    CHF exacerbation ([place]) 04/19/2021   Colitis 04/05/2021   Acute colitis 04/04/2021   Acute kidney injury superimposed on CKD ([place]) 04/04/2021   HTN (hypertension) 04/04/2021   Atrial fibrillation, chronic (HCC) 04/04/2021   Chronic diastolic CHF (congestive heart failure) ([place]) 04/04/2021   Rectal bleeding 04/04/2021   COPD (chronic obstructive pulmonary disease) ([place]) 04/04/2021    HLD (hyperlipidemia) 04/04/2021   Hypothyroid 04/04/2021   Cirrhosis ([place]) 04/04/2021   Breast cancer ([place]) 04/04/2021   Dementia ([place]) 04/04/2021   History of radiation therapy 04/02/2021   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   Senile purpura ([place]) 10/30/2020   Acquired thrombophilia ([place]) 06/13/2020   Essential hypertension 03/14/2020   Hyperlipidemia 03/14/2020   Acute ischemic stroke ([place]) L MCA s/p mechanical thrombectomy, embolic d/t AF not on [facility] 03/10/2020   History of stroke 03/10/2020   Osteopenia 01/21/2020   Family history of breast cancer    Family history of multiple myeloma    Family history of esophageal cancer    Displaced comminuted fracture of shaft of right humerus 12/05/2018   Recurrent falls 12/05/2018   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/2018   MGUS (monoclonal gammopathy of unknown significance) 12/14/2017   Imbalance 06/24/2017   Tremor 06/24/2017   ICD (implantable cardioverter-defibrillator), biventricular, 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   Carcinoma of overlapping sites of right breast in female, estrogen receptor positive ([place]) 06/16/2016   Acute exacerbation of CHF (congestive heart failure) ([place]) 06/07/2016   A-fib ([place]) 04/08/2016   COPD exacerbation ([place]) 03/22/2016  Acute bronchitis 03/22/2016   Atrial fibrillation with RVR ([place]) 03/22/2016   Chronic renal insufficiency 03/22/2016   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/30/2016   Cough with expectoration 06/04/2015   Essential thrombocytosis ([place]) 02/01/2015   Anemia 02/01/2015   Atrial flutter ([place]) 01/23/2015   Atrial fibrillation ([place]) 01/23/2015   Other long term (current) drug therapy 11/06/2014   High risk medication use 11/06/2014   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   Chronic diastolic heart failure ([place]) 08/01/2014   Severe mitral insufficiency 08/01/2014   Long term current use of anticoagulant 06/06/2014   S/P mitral valve repair 06/06/2014   Anxiety  05/13/2014   Chronic obstructive pulmonary disease ([place]) 03/22/2014   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/22/2014   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   Avitaminosis D 02/19/2014   GERD (gastroesophageal reflux disease) 02/19/2014   PCP:  Sallee Lange, NP Pharmacy:   [facility], [place] [address] [place] [address] Phone: [phone] Fax: [phone]     Social Determinants of Health (SDOH) Social History: SDOH Screenings   Depression (PHQ2-9): Low Risk  (10/17/2021)  Tobacco Use: Low Risk  (08/28/2022)   SDOH Interventions:     Readmission Risk Interventions    01/14/2022   10:57 AM 04/06/2021   10:09 AM  Readmission Risk Prevention Plan  Transportation Screening Complete Complete  PCP or Specialist Appt within 3-5 Days  Complete  HRI or [place]  Complete  Social Work Consult for [place] Planning/Counseling  Complete  Palliative Care Screening  Complete  Medication Review Press photographer) Complete Complete  HRI or [place] Complete   SW Recovery Care/Counseling Consult Complete   Palliative Care Screening Not [place] Not Applicable

## 2022-11-30 NOTE — Consult Note (Signed)
CODE SEPSIS - PHARMACY COMMUNICATION  **Broad Spectrum Antibiotics should be administered within 1 hour of Sepsis diagnosis**  Time Code Sepsis Called/Page Received: 0930  Antibiotics Ordered: vancomycin, cefepime, flagyl  Time of 1st antibiotic administration: flagyl'@1119'$   Additional action taken by pharmacy: per nurse, attempted to gain IV access, but unable. IV team consult placed for access  If necessary, Name of Provider/Nurse: Roland Earl Benjiman Core ,PharmD Clinical Pharmacist  11/30/2022  10:55 AM

## 2022-11-30 NOTE — ED Provider Notes (Signed)
[facility] Provider Note    Event Date/Time   First MD Initiated Contact with Patient 11/30/22 [phone]     (approximate)   History   Chief Complaint Tremors   HPI  Adriana Martin is a 77 y.o. female with past medical history of hypertension, hyperlipidemia, COPD, chronic hypoxic respiratory failure on 2 L nasal cannula, CHF, A-fib on Eliquis, and stroke who presents to the ED for tremors.  Per EMS, patient noted to be very shaky with diffuse tremors by staff at her nursing facility earlier this morning.  They were concerned for possible seizure and EMS was called.  Per EMS, patient has advanced dementia and is at her baseline mental status, is typically nonverbal at baseline.  Patient with diffuse shaking in all 4 extremities per EMS, no apparent loss of consciousness noted.     Physical Exam   Triage Vital Signs: ED Triage Vitals  Enc Vitals Group     BP      Pulse      Resp      Temp      Temp src      SpO2      Weight      Height      Head Circumference      Peak Flow      Pain Score      Pain Loc      Pain Edu?      Excl. in [place]?     Most recent vital signs: Vitals:   11/30/22 1030 11/30/22 1100  BP: 111/61 132/64  Pulse: 74 63  Resp: 14 17  Temp:    SpO2:  94%    Constitutional: Somnolent but arousable to voice, nonverbal. Eyes: Conjunctivae are normal. Head: Atraumatic. Nose: No congestion/rhinnorhea. Mouth/Throat: Mucous membranes are moist.  Cardiovascular: Normal rate, regular rhythm. Grossly normal heart sounds.  2+ radial pulses bilaterally. Respiratory: Tachypneic with increased respiratory effort, brief periods of apnea noted.  Lungs clear to auscultation bilaterally. Gastrointestinal: Soft and nontender. No distention. Musculoskeletal: No lower extremity tenderness nor edema.  Neurologic: No verbal response, opens eyes spontaneously.  Moving all extremities with diffuse tremor noted, no gross focal neurologic  deficits are appreciated.    ED Results / Procedures / Treatments   Labs (all labs ordered are listed, but only abnormal results are displayed) Labs Reviewed  LACTIC ACID, PLASMA - Abnormal; Notable for the following components:      Result Value   Lactic Acid, Venous >9.0 (*)    All other components within normal limits  COMPREHENSIVE METABOLIC PANEL - Abnormal; Notable for the following components:   Chloride 97 (*)    CO2 15 (*)    Glucose, Bld 161 (*)    Creatinine, Ser 1.36 (*)    Calcium 8.7 (*)    Albumin 2.9 (*)    AST 52 (*)    GFR, Estimated 40 (*)    Anion gap 25 (*)    All other components within normal limits  CBC WITH DIFFERENTIAL/PLATELET - Abnormal; Notable for the following components:   WBC 20.7 (*)    Hemoglobin 11.5 (*)    Neutro Abs 16.7 (*)    Monocytes Absolute 1.3 (*)    Abs Immature Granulocytes 0.15 (*)    All other components within normal limits  PROTIME-INR - Abnormal; Notable for the following components:   Prothrombin Time 15.6 (*)    INR 1.3 (*)    All other components within normal  limits  TROPONIN I (HIGH SENSITIVITY) - Abnormal; Notable for the following components:   Troponin I (High Sensitivity) 91 (*)    All other components within normal limits  RESP PANEL BY RT-PCR (RSV, FLU A&B, COVID)  RVPGX2  CULTURE, BLOOD (ROUTINE X 2)  CULTURE, BLOOD (ROUTINE X 2)  MAGNESIUM  LACTIC ACID, PLASMA  URINALYSIS, W/ REFLEX TO CULTURE (INFECTION SUSPECTED)  PROCALCITONIN  BRAIN NATRIURETIC PEPTIDE  TROPONIN I (HIGH SENSITIVITY)     EKG  ED ECG REPORT I, Blake Divine, the attending physician, personally viewed and interpreted this ECG.   Date: 11/30/2022  EKG Time: 9:41  Rate: 111  Rhythm: Ventricular paced rhythm  Axis: LAD  Intervals:nonspecific intraventricular conduction delay  ST&T Change: None  RADIOLOGY Chest x-ray reviewed and interpreted by me with cardiomegaly and diffuse edema, no focal infiltrate  noted.  PROCEDURES:  Critical Care performed: Yes, see critical care procedure note(s)  .Critical Care  Performed by: Blake Divine, MD Authorized by: Blake Divine, MD   Critical care provider statement:    Critical care time (minutes):  30   Critical care time was exclusive of:  Separately billable procedures and treating other patients and teaching time   Critical care was necessary to treat or prevent imminent or life-threatening deterioration of the following conditions:  Sepsis   Critical care was time spent personally by me on the following activities:  Development of treatment plan with patient or surrogate, discussions with consultants, evaluation of patient's response to treatment, examination of patient, ordering and review of laboratory studies, ordering and review of radiographic studies, ordering and performing treatments and interventions, pulse oximetry, re-evaluation of patient's condition and review of old charts   I assumed direction of critical care for this patient from another provider in my specialty: no     Care discussed with: admitting provider      MEDICATIONS ORDERED IN ED: Medications  ceFEPIme (MAXIPIME) 2 g in sodium chloride 0.9 % 100 mL IVPB (has no administration in time range)  vancomycin (VANCOCIN) IVPB 1000 mg/200 mL premix (1,000 mg Intravenous New Bag/Given 11/30/22 1132)  acetaminophen (TYLENOL) tablet 650 mg (has no administration in time range)  acetaminophen (TYLENOL) suppository 650 mg (has no administration in time range)  ondansetron (ZOFRAN) injection 4 mg (has no administration in time range)  albuterol (PROVENTIL) (2.5 MG/3ML) 0.083% nebulizer solution 2.5 mg (has no administration in time range)  dextromethorphan-guaiFENesin (MUCINEX DM) 30-600 MG per 12 hr tablet 1 tablet (has no administration in time range)  hydrALAZINE (APRESOLINE) injection 5 mg (has no administration in time range)  sodium chloride 0.9 % bolus 1,000 mL (has no  administration in time range)  0.9 %  sodium chloride infusion (has no administration in time range)  metroNIDAZOLE (FLAGYL) IVPB 500 mg (500 mg Intravenous New Bag/Given 11/30/22 1119)  lactated ringers bolus 1,000 mL (1,000 mLs Intravenous New Bag/Given 11/30/22 1043)  acetaminophen (TYLENOL) suppository 650 mg (650 mg Rectal Given 11/30/22 1106)     IMPRESSION / MDM / [place] / ED COURSE  I reviewed the triage vital signs and the nursing notes.                              77 y.o. female with past medical history of hypertension, hyperlipidemia, COPD, chronic hypoxic respiratory failure on 2 L nasal cannula, CKD, atrial fibrillation on Eliquis, stroke, breast cancer, and dementia who presents to the ED for diffuse  tremor noted earlier this morning at her nursing facility.  Patient's presentation is most consistent with acute presentation with potential threat to life or bodily function.  Differential diagnosis includes, but is not limited to, seizure, rigors, sepsis, essential tremor, electrolyte abnormality, AKI, pneumonia, UTI, COVID-19, influenza.  Patient ill-appearing with diffuse tremor noted in all 4 extremities which appears most consistent with rigors.  She is arousable to voice and with no loss of consciousness, doubt generalized seizure.  Patient noted to be febrile with a rectal temp of 102, initial labs show significant leukocytosis.  Presentation concerning for sepsis and we will start broad-spectrum antibiotics along with IV fluid resuscitation.  Patient requiring 4 L nasal cannula to maintain oxygen saturations, lungs are clear to auscultation bilaterally but she does seem to have brief periods of apnea.  I did confirm with her daughter at bedside that patient is DNR/DNI but family remains interested in resuscitation with fluids and antibiotics.  Chest x-ray concerning for pulmonary edema, lactic acid greater than 9 but will hold off on full fluid resuscitation due to  concern this could worsen her respiratory status.  Renal function febrile to previous with anion gap acidosis, likely due to elevated lactic acid.  No significant electrolyte abnormality noted, LFTs are unremarkable.  Troponin is elevated, likely secondary to sepsis and we will trend.  Urinalysis is pending at this time, source of sepsis is unclear but patient does not appear to have any abdominal tenderness on exam.  Blood pressure remains stable, case discussed with hospitalist for admission.      FINAL CLINICAL IMPRESSION(S) / ED DIAGNOSES   Final diagnoses:  Sepsis without acute organ dysfunction, due to unspecified organism ([place])  Altered mental status, unspecified altered mental status type     Rx / DC Orders   ED Discharge Orders     None        Note:  This document was prepared using Dragon voice recognition software and may include unintentional dictation errors.   Blake Divine, MD 11/30/22 [phone]

## 2022-11-30 NOTE — H&P (Signed)
History and Physical    Adriana Martin [id number] DOB: August 04, 1946 DOA: 11/30/2022  Referring MD/NP/PA:   PCP: Sallee Lange, NP   Patient coming from:  The patient is coming from SNF  Chief Complaint: shaking and fever  HPI: Adriana Martin is a 77 y.o. female with medical history significant of hypertension, HLD, COPD on 2 L oxygen, hypothyroidism, depression, anxiety, pacemaker placement, primary biliary cirrhosis, breast cancer (s/p of right mastectomy and radiation therapy), dementia, dCHF, atrial fibrillation on Eliquis, CKD stage IIIb, who presents with shaking, fever.  Per her daughter at the bedside, patient was recently hospitalized to [facility] at 2/27 due to COVID infection.  Patient was treated with remdesivir and Decadron.  Patient was discharged to the nursing home.  Since this morning, patient was noted to be very shaky in all extremities, with fever and rigors. Her temperature is 102.0 in ED.   Per her daughter, patient has hx of dementia. At her normal baseline, patient recognizes family members, usually not orientated to place and time. When I saw pt in ED, she is confused, does not seem to be responding to the questions that her daughter asked for.  Not orientated to place and time.  She moves all extremities.  Patient has cough, no respiratory distress.  She has oxygen desaturated to 86% on home level 2 L oxygen, which improved to 94% on 4 L oxygen. Not sure if patient has any chest pain or abdominal pain.  Patient gags a lot, but no vomiting or diarrhea.  Not sure if patient has symptoms of UTI.    Data reviewed independently and ED Course: pt was found to have WBC 20.7, lactic acid > 9.0 iniitially --> 1.5 after given 2L of IVF in ED, stable renal function, temperature 102.0, blood pressure 132/64, heart rate 74, RR 17.  CT-head negative for acute intracranial abnormalities.  Chest x-ray showed cardiomegaly and interstitial edema.  Patient is admitted to PCU  as inpatient.  CT-renal stone: Increased stool in rectum.   Minimal sigmoid diverticulosis without evidence of diverticulitis.   Bibasilar effusions and atelectasis.   No acute intra-abdominal or intrapelvic abnormalities.   Aortic Atherosclerosis (ICD10-I70.0).   EKG: I have personally reviewed.  Paced rhythm.   Review of Systems: Could not be reviewed due to dementia.   Allergy:  Allergies  Allergen Reactions   Atorvastatin Shortness Of Breath    Tolerates pravastatin   Calcium Shortness Of Breath   Fluoxetine     Other reaction(s): Unknown   Hydrochlorothiazide     Other reaction(s): Unknown    Past Medical History:  Diagnosis Date   Alcohol use    Anemia    Anxiety    Atrial fibrillation (HCC)    B12 deficiency    Breast cancer ([place])    RT Lumpectomy c radiation 2012   Breast cancer (HCC)    CHF (congestive heart failure) (HCC)    Chronic diastolic CHF (congestive heart failure) (HCC)    Chronic kidney disease    Cirrhosis (HCC)    CKD (chronic kidney disease), stage III (HCC)    COPD (chronic obstructive pulmonary disease) (HCC)    Dementia (HCC)    Depression    Family history of breast cancer    Family history of esophageal cancer    Family history of multiple myeloma    GERD (gastroesophageal reflux disease)    Headache    Heart murmur    HLD (hyperlipidemia)    HTN (hypertension)  Hyperlipemia    Hyperlipidemia    Hypertension    Hypothyroid    Hypothyroidism    Iron (Fe) deficiency anemia    Monoclonal gammopathy    Pacemaker    Personal history of radiation therapy    Presence of permanent cardiac pacemaker    Shortness of breath dyspnea    Thrombocytopenia (HCC)    Vitamin D deficiency     Past Surgical History:  Procedure Laterality Date   ABDOMINAL HYSTERECTOMY     Partial   ABLATION     APPENDECTOMY     BICEPT TENODESIS Right 01/07/2018   Procedure: BICEPS TENODESIS;  Surgeon: Leim Fabry, MD;  Location: [facility];   Service: Orthopedics;  Laterality: Right;   BREAST BIOPSY Right 2012   BREAST BIOPSY Right 07/21/2019   venus clip, [place] Bx,positive   BREAST LUMPECTOMY Right 2012   positive/rad   BREAST SURGERY     CARDIAC SURGERY     CARDIAC VALVE REPLACEMENT     COLONOSCOPY  2012   ELECTROPHYSIOLOGIC STUDY N/A 01/23/2015   Procedure: CARDIOVERSION;  Surgeon: Corey Skains, MD;  Location: [facility];  Service: Cardiovascular;  Laterality: N/A;   ELECTROPHYSIOLOGIC STUDY N/A 05/27/2016   Procedure: CARDIOVERSION;  Surgeon: Corey Skains, MD;  Location: [facility];  Service: Cardiovascular;  Laterality: N/A;   ESOPHAGOGASTRODUODENOSCOPY  2012   EYE SURGERY     IR CT HEAD LTD  03/10/2020   IR PERCUTANEOUS ART THROMBECTOMY/INFUSION INTRACRANIAL INC DIAG ANGIO  03/10/2020   IR US GUIDE VASC ACCESS RIGHT  03/10/2020   MASTECTOMY Right    Mastectomy Right    MASTECTOMY WITH AXILLARY LYMPH NODE DISSECTION Right 08/09/2019   Procedure: MASTECTOMY WITH AXILLARY LYMPH NODE DISSECTION;  Surgeon: Robert Bellow, MD;  Location: [facility];  Service: General;  Laterality: Right;   MITRAL VALVE REPLACEMENT     mvp     PACEMAKER PLACEMENT N/A    RADIOLOGY WITH ANESTHESIA N/A 03/10/2020   Procedure: IR WITH ANESTHESIA;  Surgeon: Radiologist, Medication, MD;  Location: [place];  Service: Radiology;  Laterality: N/A;   REVERSE SHOULDER ARTHROPLASTY Right 01/07/2018   Procedure: REVERSE SHOULDER ARTHROPLASTY;  Surgeon: Leim Fabry, MD;  Location: [facility];  Service: Orthopedics;  Laterality: Right;   VSD REPAIR     VSD REPAIR N/A     Social History:  reports that she has never smoked. She has never used smokeless tobacco. She reports that she does not currently use alcohol. She reports that she does not use drugs.  Family History:  Family History  Problem Relation Age of Onset   Multiple myeloma Father    Esophageal cancer Brother    Breast cancer Mother        never treated, dx. >50   Esophageal cancer Brother     Breast cancer Daughter 6   Cancer Daughter 39       rare blood cancer     Prior to Admission medications   Medication Sig Start Date End Date Taking? Authorizing Provider  ACETAMINOPHEN EXTRA STRENGTH 500 MG capsule Take 1 Dose by mouth as needed. 08/12/21  Yes [provider]  ADVAIR DISKUS 100-50 MCG/DOSE AEPB Inhale 1 puff into the lungs 2 (two) times daily. 04/15/20  Yes [provider]  ELIQUIS 5 MG TABS tablet Take 5 mg by mouth 2 (two) times daily. 03/05/21  Yes [provider]  ezetimibe (ZETIA) 10 MG tablet Take 10 mg by mouth daily. 02/05/21  Yes [provider]  furosemide (LASIX) 20 MG tablet Take 20 mg by mouth daily.   Yes [provider]  ibuprofen (ADVIL) 200 MG tablet Take 400 mg by mouth daily as needed. Take 2 tablets (400 mg total) by mouth once as needed for pain.   Yes [provider]  ipratropium-albuterol (DUONEB) 0.5-2.5 (3) MG/3ML SOLN Take 3 mLs by nebulization 3 (three) times daily.   Yes [provider]  levothyroxine (SYNTHROID) 50 MCG tablet Take 50 mcg by mouth daily before breakfast. 02/05/21  Yes [provider]  lisinopril (ZESTRIL) 5 MG tablet TAKE (1) TABLET BY MOUTH EVERY DAY TO PROTECT KIDNEYS 07/21/21  Yes [provider]  melatonin 5 MG TABS Take 5 mg by mouth at bedtime.   Yes [provider]  memantine (NAMENDA) 10 MG tablet Take 5 mg by mouth 2 (two) times daily.   Yes [provider]  Multiple Vitamin (MULTIVITAMIN) tablet Take 1 tablet by mouth daily.   Yes [provider]  pantoprazole (PROTONIX) 40 MG tablet Take 1 tablet (40 mg total) by mouth 2 (two) times daily. 04/28/21  Yes Nolberto Hanlon, MD  potassium chloride (KLOR-CON) 10 MEQ tablet Take 10 mEq by mouth 2 (two) times daily. 11/11/21  Yes [provider]  pravastatin (PRAVACHOL) 40 MG tablet Take 40 mg by mouth at bedtime. 04/02/21  Yes [provider]  predniSONE  (DELTASONE) 5 MG tablet Take 5 mg by mouth daily with breakfast.   Yes [provider]  tamoxifen (NOLVADEX) 20 MG tablet TAKE (1) TABLET BY MOUTH EVERY DAY 09/07/22  Yes Verlon Au, NP  ursodiol (ACTIGALL) 300 MG capsule Take 2 capsules by mouth 2 (two) times daily. 01/19/22  Yes [provider]  venlafaxine XR (EFFEXOR-XR) 37.5 MG 24 hr capsule Take 75 mg by mouth daily. 01/14/20  Yes [provider]  VENTOLIN HFA 108 (90 Base) MCG/ACT inhaler Inhale 1 puff into the lungs every 4 (four) hours as needed. 01/31/21  Yes [provider]  donepezil (ARICEPT) 5 MG tablet Take 5 mg by mouth at bedtime. Patient not taking: Reported on 11/30/2022 03/21/21   [provider]  Vitamin D, Ergocalciferol, (DRISDOL) 1.25 MG (50000 UNIT) CAPS capsule Take 50,000 Units by mouth every 14 (fourteen) days. On Sundays Patient not taking: Reported on 11/30/2022    [provider]    Physical Exam: Vitals:   11/30/22 1340 11/30/22 1400 11/30/22 1645 11/30/22 1735  BP:    (!) 156/52  Pulse: 71  63 63  Resp: 18  15 (!) 23  Temp:  99.9 F (37.7 C)    TempSrc:      SpO2: 90%  97% 92%  Weight:  71.2 kg     General: Not in acute distress HEENT:       Eyes: PERRL, EOMI, no scleral icterus.       ENT: No discharge from the ears and nose       Neck: Positive JVD, no bruit, no mass felt. Heme: No neck lymph node enlargement. Cardiac: S1/S2, RRR, No murmurs, No gallops or rubs. Respiratory: No rales, wheezing, rhonchi or rubs. GI: Soft, nondistended, nontender, no organomegaly, BS present. GU: No hematuria Ext: 1+ pitting leg edema bilaterally. 1+DP/PT pulse bilaterally. Musculoskeletal: No joint deformities, No joint redness or warmth, no limitation of ROM in spin. Skin: No rashes.  Neuro: Confused, not following command, not oriented x 3, cranial nerves II-XII grossly intact, moves all extremities  Psych: Patient is not  psychotic, no suicidal or hemocidal  ideation.  Labs on Admission: I have personally reviewed following labs and imaging studies  CBC: Recent Labs  Lab 11/30/22 0936  WBC 20.7*  NEUTROABS 16.7*  HGB 11.5*  HCT 37.7  MCV 92.0  PLT [id number]   Basic Metabolic Panel: Recent Labs  Lab 11/30/22 0936  NA 137  K 3.8  CL 97*  CO2 15*  GLUCOSE 161*  BUN 17  CREATININE 1.36*  CALCIUM 8.7*  MG 1.9   GFR: Estimated Creatinine Clearance: 32 mL/min (A) (by C-G formula based on SCr of 1.36 mg/dL (H)). Liver Function Tests: Recent Labs  Lab 11/30/22 0936  AST 52*  ALT 22  ALKPHOS 69  BILITOT 0.8  PROT 7.2  ALBUMIN 2.9*   No results for input(s): "LIPASE", "AMYLASE" in the last 168 hours. Recent Labs  Lab 11/30/22 1630  AMMONIA 15   Coagulation Profile: Recent Labs  Lab 11/30/22 0936  INR 1.3*   Cardiac Enzymes: No results for input(s): "CKTOTAL", "CKMB", "CKMBINDEX", "TROPONINI" in the last 168 hours. BNP (last 3 results) No results for input(s): "PROBNP" in the last 8760 hours. HbA1C: No results for input(s): "HGBA1C" in the last 72 hours. CBG: No results for input(s): "GLUCAP" in the last 168 hours. Lipid Profile: No results for input(s): "CHOL", "HDL", "LDLCALC", "TRIG", "CHOLHDL", "LDLDIRECT" in the last 72 hours. Thyroid Function Tests: No results for input(s): "TSH", "T4TOTAL", "FREET4", "T3FREE", "THYROIDAB" in the last 72 hours. Anemia Panel: No results for input(s): "VITAMINB12", "FOLATE", "FERRITIN", "TIBC", "IRON", "RETICCTPCT" in the last 72 hours. Urine analysis:    Component Value Date/Time   COLORURINE YELLOW (A) 11/30/2022 1343   APPEARANCEUR CLOUDY (A) 11/30/2022 1343   APPEARANCEUR Clear 06/09/2013 2122   LABSPEC 1.018 11/30/2022 1343   LABSPEC 1.004 06/09/2013 2122   PHURINE 5.0 11/30/2022 1343   GLUCOSEU NEGATIVE 11/30/2022 1343   GLUCOSEU Negative 06/09/2013 2122   HGBUR NEGATIVE 11/30/2022 1343   BILIRUBINUR NEGATIVE 11/30/2022 1343   BILIRUBINUR Negative 06/09/2013 2122    KETONESUR NEGATIVE 11/30/2022 1343   PROTEINUR 100 (A) 11/30/2022 1343   NITRITE NEGATIVE 11/30/2022 1343   LEUKOCYTESUR SMALL (A) 11/30/2022 1343   LEUKOCYTESUR Negative 06/09/2013 2122   Sepsis Labs: '@LABRCNTIP'$ (procalcitonin:4,lacticidven:4) ) Recent Results (from the past 240 hour(s))  Resp panel by RT-PCR (RSV, Flu A&B, Covid) Anterior Nasal Swab     Status: None   Collection Time: 11/30/22  9:36 AM   Specimen: Anterior Nasal Swab  Result Value Ref Range Status   SARS Coronavirus 2 by RT PCR NEGATIVE NEGATIVE Final    Comment: (NOTE) SARS-CoV-2 target nucleic acids are NOT DETECTED.  The SARS-CoV-2 RNA is generally detectable in upper respiratory specimens during the acute phase of infection. The lowest concentration of SARS-CoV-2 viral copies this assay can detect is 138 copies/mL. A negative result does not preclude SARS-Cov-2 infection and should not be used as the sole basis for treatment or other patient management decisions. A negative result may occur with  improper specimen collection/handling, submission of specimen other than nasopharyngeal swab, presence of viral mutation(s) within the areas targeted by this assay, and inadequate number of viral copies(<138 copies/mL). A negative result must be combined with clinical observations, patient history, and epidemiological information. The expected result is Negative.  Fact Sheet for Patients:  EntrepreneurPulse.com.au  Fact Sheet for Healthcare Providers:  IncredibleEmployment.be  This test is no t yet approved or cleared by the [place] FDA and  has been authorized for detection and/or diagnosis  of SARS-CoV-2 by FDA under an Emergency Use Authorization (EUA). This EUA will remain  in effect (meaning this test can be used) for the duration of the COVID-19 declaration under Section 564(b)(1) of the Act, 21 U.S.C.section 360bbb-3(b)(1), unless the authorization is terminated   or revoked sooner.       Influenza A by PCR NEGATIVE NEGATIVE Final   Influenza B by PCR NEGATIVE NEGATIVE Final    Comment: (NOTE) The Xpert Xpress SARS-CoV-2/FLU/RSV plus assay is intended as an aid in the diagnosis of influenza from Nasopharyngeal swab specimens and should not be used as a sole basis for treatment. Nasal washings and aspirates are unacceptable for Xpert Xpress SARS-CoV-2/FLU/RSV testing.  Fact Sheet for Patients: EntrepreneurPulse.com.au  Fact Sheet for Healthcare Providers: IncredibleEmployment.be  This test is not yet approved or cleared by the [place] FDA and has been authorized for detection and/or diagnosis of SARS-CoV-2 by FDA under an Emergency Use Authorization (EUA). This EUA will remain in effect (meaning this test can be used) for the duration of the COVID-19 declaration under Section 564(b)(1) of the Act, 21 U.S.C. section 360bbb-3(b)(1), unless the authorization is terminated or revoked.     Resp Syncytial Virus by PCR NEGATIVE NEGATIVE Final    Comment: (NOTE) Fact Sheet for Patients: EntrepreneurPulse.com.au  Fact Sheet for Healthcare Providers: IncredibleEmployment.be  This test is not yet approved or cleared by the [place] FDA and has been authorized for detection and/or diagnosis of SARS-CoV-2 by FDA under an Emergency Use Authorization (EUA). This EUA will remain in effect (meaning this test can be used) for the duration of the COVID-19 declaration under Section 564(b)(1) of the Act, 21 U.S.C. section 360bbb-3(b)(1), unless the authorization is terminated or revoked.  Performed at [facility], [address] [place], [place] [address]      Radiological Exams on Admission: CT RENAL STONE STUDY  Result Date: 11/30/2022 CLINICAL DATA:  Abdominal and flank pain suspected kidney stone, history chronic hypoxic respiratory failure, CHF,  atrial fibrillation, hypertension, COPD, stroke EXAM: CT ABDOMEN AND PELVIS WITHOUT CONTRAST TECHNIQUE: Multidetector CT imaging of the abdomen and pelvis was performed following the standard protocol without IV contrast. RADIATION DOSE REDUCTION: This exam was performed according to the departmental dose-optimization program which includes automated exposure control, adjustment of the mA and/or kV according to patient size and/or use of iterative reconstruction technique. COMPARISON:  04/04/2021 FINDINGS: Lower chest: Bibasilar effusions and atelectasis. Pacemaker leads RIGHT ventricle and coronary sinus. Post MVR. Hepatobiliary: Gallbladder and liver unremarkable Pancreas: Normal appearance Spleen: Normal appearance, within limitations of respiratory motion. Adrenals/Urinary Tract: Adrenal glands unremarkable. 12 mm RIGHT renal cyst unchanged. No follow-up imaging recommended. No additional renal mass, hydronephrosis, or hydroureter. Bladder unremarkable. Stomach/Bowel: Increased stool in rectum. Minimal sigmoid diverticulosis without evidence of diverticulitis. Stomach and bowel loops otherwise normal appearance. Appendix not visualized. Vascular/Lymphatic: Scattered pelvic phleboliths. Atherosclerotic calcifications aorta and iliac arteries without aneurysm. No adenopathy. Reproductive: Uterus surgically absent.  Unremarkable ovaries Other: No free air or free fluid. No inflammatory process or definite hernia. Musculoskeletal: Osseous demineralization. Chronic compression fractures T8 and L1 unchanged. IMPRESSION: Increased stool in rectum. Minimal sigmoid diverticulosis without evidence of diverticulitis. Bibasilar effusions and atelectasis. No acute intra-abdominal or intrapelvic abnormalities. Aortic Atherosclerosis (ICD10-I70.0). Electronically Signed   By: Lavonia Dana M.D.   On: 11/30/2022 16:21   CT HEAD WO CONTRAST (5MM)  Result Date: 11/30/2022 CLINICAL DATA:  Mental status change, unknown cause  EXAM: CT HEAD WITHOUT CONTRAST TECHNIQUE: Contiguous axial images  were obtained from the base of the skull through the vertex without intravenous contrast. RADIATION DOSE REDUCTION: This exam was performed according to the departmental dose-optimization program which includes automated exposure control, adjustment of the mA and/or kV according to patient size and/or use of iterative reconstruction technique. COMPARISON:  CT head April 21, 23. FINDINGS: Brain: Similar appearance of a prior left parietal/temporal infarct. Similar small remote right cerebellar infarct. No evidence of acute large vascular territory infarct, acute hemorrhage, mass lesion or midline shift. Patchy white matter hypodensities, compatible with chronic microvascular ischemic disease. Cerebral atrophy. Vascular: No hyperdense vessel identified. Skull: No acute fracture. Sinuses/Orbits: Clear sinuses.  No acute orbital findings. Other: No mastoid effusions. IMPRESSION: Similar appearance of a prior left parietal/temporal infarct. Electronically Signed   By: Margaretha Sheffield M.D.   On: 11/30/2022 16:20   DG Chest Port 1 View  Result Date: 11/30/2022 CLINICAL DATA:  Sepsis EXAM: PORTABLE CHEST 1 VIEW COMPARISON:  08/03/2022 FINDINGS: Cardiac silhouette is enlarged with increased bilateral interstitial opacities favored to represent edema over pneumonia. Suspect small effusions bilaterally. No pneumothorax. Postop changes from left subclavian 2 lead pacer and cardiac valve. Aorta atherosclerotic. Remote right shoulder arthroplasty noted. Stable right lower lung calcified granuloma. IMPRESSION: Cardiomegaly with diffuse interstitial edema pattern and small effusions. Electronically Signed   By: Jerilynn Mages.  Shick M.D.   On: 11/30/2022 10:44      Assessment/Plan Principal Problem:   Severe sepsis (HCC) Active Problems:   UTI (urinary tract infection)   Myocardial injury   Acute on chronic diastolic congestive heart failure (HCC)   Chronic  obstructive pulmonary disease (HCC)   Essential hypertension   Atrial fibrillation (HCC)   Primary biliary cholangitis (HCC)   HLD (hyperlipidemia)   Breast cancer (HCC)   History of stroke   Hypothyroidism   Chronic kidney disease, stage 3a (HCC)   Dementia (HCC)   Assessment and Plan:  Severe sepsis due to UTI (urinary tract infection):  pt has severe sepsis with WBC 13.7, fever 102.0, lactic acid initially > 9.0, which is normalized to 1.5 after giving 2 L IVF bolus. CT-renal stone is negative for kidney stone.  -Admitted to PCU as inpatient -IV Rocephin (patient received 1 dose of vancomycin, cefepime and Flagyl in ED) -Blood culture and urine culture -IV fluid: 2 L IV fluid bolus.  Will not give more IV fluid since patient has CHF exacerbation. -Check procalcitonin level  Myocardial injury: Troponin level 91, 100, 79 -Will not give aspirin since patient is on Eliquis -Pravastatin -Trend troponin -Check A1c, FLP  Acute on chronic diastolic congestive heart failure ([place]): 2D echo on 11/18/2022 showed EF of 65-70%.  Patient has 1+ leg edema, elevated BNP 1044, interstitial pulm edema chest x-ray, clinically consistent with CHF exacerbation.  Patient has increased oxygen requirement from 2 to 4 L now.  Since lactic acid is already normalized.  Will start on IV Lasix. -give 40 mg of Lasix now, then 40 mg twice daily in morning -Daily weights -strict I/O's -Low salt diet -Fluid restriction -Obtain REDs Vest reading  Chronic obstructive pulmonary disease (HCC) -Bronchodilators  Essential hypertension -IV hydralazine as needed -Patient is on IV Lasix -Hold lisinopril since patient at high risk of developing hypotension due to severe sepsis  Atrial fibrillation [facility]): Heart rate 74 -Continue Eliquis  Primary biliary cholangitis ([place]): Patient is still taking prednisone 5 mg daily chronically. -Continue ursodiol  -Give Solu-Cortef every 8 hours, 100 mg for 2 dose as stress  dose -Increase prednisone  dose from 5 to 20 mg daily  HLD (hyperlipidemia) -Pravastatin and Zetia  Breast cancer (HCC) -Tamoxifen  History of stroke -Pravastatin, Zetia and Eliquis  Hypothyroidism -Synthroid  Chronic kidney disease, stage 3a ([place]): Stable -Follow-up by BMP  Dementia ([place]) -[place]   DVT ppx: on Eliquis  Code Status: DNR per her daughter. OK to use vasopressors per her daughter  Family Communication:   Yes, patient's daughter   at bed side.    Disposition Plan:  Anticipate discharge back to previous environment, SNF  Consults called:  none  Admission status and Level of care: Progressive:   as inpt      Dispo: The patient is from: SNF              Anticipated d/c is to: SNF              Anticipated d/c date is: 2 days              Patient currently is not medically stable to d/c.    Severity of Illness:  The appropriate patient status for this patient is INPATIENT. Inpatient status is judged to be reasonable and necessary in order to provide the required intensity of service to ensure the patient's safety. The patient's presenting symptoms, physical exam findings, and initial radiographic and laboratory data in the context of their chronic comorbidities is felt to place them at high risk for further clinical deterioration. Furthermore, it is not anticipated that the patient will be medically stable for discharge from the hospital within 2 midnights of admission.   * I certify that at the point of admission it is my clinical judgment that the patient will require inpatient hospital care spanning beyond 2 midnights from the point of admission due to high intensity of service, high risk for further deterioration and high frequency of surveillance required.*       Date of Service 11/30/2022    Ivor Costa [facility]   If 7PM-7AM, please contact night-coverage [email or web address] 11/30/2022, 5:45 PM

## 2022-11-30 NOTE — ED Notes (Signed)
Blaine Hamper, MD, made aware of troponin 100

## 2022-11-30 NOTE — ED Notes (Signed)
Charna Archer, MD, made aware of lactic >9.

## 2022-11-30 NOTE — Progress Notes (Signed)
Dear Doctor:  This patient has been identified as a candidate for central line for the following reason (s): poor veins/poor circulatory system (CHF, COPD, emphysema, diabetes, steroid use, IV drug abuse, etc.), restarts due to phlebitis and infiltration in 24 hours, and incompatible drugs (aminophyllin, TPN, heparin, given with an antibiotic) Pt and daughter report history of mastectomy with lymph node removal on RUE. L pacemaker. L forearm/ AC infiltration. For any questions contact the Vascular Access Team at [phone] if no answer, please leave a message.  Thank you for supporting the early vascular access assessment program.

## 2022-11-30 NOTE — ED Notes (Signed)
Sam, RN, attempted to gain IV access, but unable. Blood work collected. Lab called to obtain blood cultures. IV Team consult put in for access. Charna Archer, MD, made aware.

## 2022-11-30 NOTE — Progress Notes (Signed)
Elink will follow per sepsis protocol  

## 2022-11-30 NOTE — ED Triage Notes (Signed)
Pt to ED via [facility] from [facility]. Pt non-verbal and hx of dementia, pacemaker, chf. Called out for seizure-like activity. 94% on 4L, baseline 2L. RR 26

## 2022-11-30 NOTE — ED Notes (Signed)
Report called to John, RN

## 2022-12-01 DIAGNOSIS — A419 Sepsis, unspecified organism: Secondary | ICD-10-CM | POA: Diagnosis not present

## 2022-12-01 DIAGNOSIS — R6520 Severe sepsis without septic shock: Secondary | ICD-10-CM | POA: Diagnosis not present

## 2022-12-01 LAB — CBC
HCT: 33.3 % — ABNORMAL LOW (ref 36.0–46.0)
Hemoglobin: 11 g/dL — ABNORMAL LOW (ref 12.0–15.0)
MCH: 28.6 pg (ref 26.0–34.0)
MCHC: 33 g/dL (ref 30.0–36.0)
MCV: 86.7 fL (ref 80.0–100.0)
Platelets: 261 10*3/uL (ref 150–400)
RBC: 3.84 MIL/uL — ABNORMAL LOW (ref 3.87–5.11)
RDW: 14.9 % (ref 11.5–15.5)
WBC: 14 10*3/uL — ABNORMAL HIGH (ref 4.0–10.5)
nRBC: 0 % (ref 0.0–0.2)

## 2022-12-01 LAB — BASIC METABOLIC PANEL
Anion gap: 14 (ref 5–15)
BUN: 21 mg/dL (ref 8–23)
CO2: 28 mmol/L (ref 22–32)
Calcium: 8.4 mg/dL — ABNORMAL LOW (ref 8.9–10.3)
Chloride: 98 mmol/L (ref 98–111)
Creatinine, Ser: 1.13 mg/dL — ABNORMAL HIGH (ref 0.44–1.00)
GFR, Estimated: 50 mL/min — ABNORMAL LOW (ref >60)
Glucose, Bld: 155 mg/dL — ABNORMAL HIGH (ref 70–99)
Potassium: 2.5 mmol/L — CL (ref 3.5–5.1)
Sodium: 140 mmol/L (ref 135–145)

## 2022-12-01 LAB — MAGNESIUM: Magnesium: 1.7 mg/dL (ref 1.7–2.4)

## 2022-12-01 LAB — RESPIRATORY PANEL BY PCR

## 2022-12-01 LAB — LIPID PANEL
Cholesterol: 191 mg/dL (ref 0–200)
HDL: 41 mg/dL (ref >40)
LDL Cholesterol: 126 mg/dL — ABNORMAL HIGH (ref 0–99)
Total CHOL/HDL Ratio: 4.7 RATIO
Triglycerides: 120 mg/dL (ref <150)
VLDL: 24 mg/dL (ref 0–40)

## 2022-12-01 LAB — HEMOGLOBIN A1C
Hgb A1c MFr Bld: 6.2 % — ABNORMAL HIGH (ref 4.8–5.6)
Mean Plasma Glucose: 131 mg/dL

## 2022-12-01 MED ORDER — PANTOPRAZOLE SODIUM 40 MG IV SOLR
40.0000 mg | Freq: Two times a day (BID) | INTRAVENOUS | Status: DC
  Administered 2022-12-01 – 2022-12-04 (×6): 40 mg via INTRAVENOUS
  Filled 2022-12-01 (×6): qty 10

## 2022-12-01 MED ORDER — PREDNISONE 10 MG PO TABS
5.0000 mg | ORAL_TABLET | Freq: Every day | ORAL | Status: DC
  Administered 2022-12-02 – 2022-12-04 (×3): 5 mg via ORAL
  Filled 2022-12-01 (×3): qty 1

## 2022-12-01 MED ORDER — SORBITOL 70 % SOLN
960.0000 mL | TOPICAL_OIL | Freq: Once | ORAL | Status: AC
  Administered 2022-12-01: 960 mL via RECTAL
  Filled 2022-12-01: qty 240

## 2022-12-01 MED ORDER — ORAL CARE MOUTH RINSE
15.0000 mL | OROMUCOSAL | Status: DC
  Administered 2022-12-01 – 2022-12-04 (×11): 15 mL via OROMUCOSAL

## 2022-12-01 MED ORDER — ORAL CARE MOUTH RINSE: 15.0000 mL | OROMUCOSAL | Status: DC | PRN

## 2022-12-01 MED ORDER — POTASSIUM CHLORIDE 10 MEQ/100ML IV SOLN
10.0000 meq | INTRAVENOUS | Status: AC
  Administered 2022-12-01 (×6): 10 meq via INTRAVENOUS
  Filled 2022-12-01 (×6): qty 100

## 2022-12-01 NOTE — Discharge Instructions (Signed)

## 2022-12-01 NOTE — Evaluation (Signed)
Clinical/Bedside Swallow Evaluation Patient Details  Name: Adriana Martin MRN: [id number] Date of Birth: September 01, 1946  Today's Date: 12/01/2022 Time: SLP Start Time (ACUTE ONLY): 76 SLP Stop Time (ACUTE ONLY): [id number] SLP Time Calculation (min) (ACUTE ONLY): 45 min  Past Medical History:  Past Medical History:  Diagnosis Date   Alcohol use    Anemia    Anxiety    Atrial fibrillation ([place])    B12 deficiency    Breast cancer ([place])    RT Lumpectomy c radiation 2012   Breast cancer (HCC)    CHF (congestive heart failure) (HCC)    Chronic diastolic CHF (congestive heart failure) (HCC)    Chronic kidney disease    Cirrhosis (HCC)    CKD (chronic kidney disease), stage III (HCC)    COPD (chronic obstructive pulmonary disease) ([place])    Dementia ([place])    Depression    Family history of breast cancer    Family history of esophageal cancer    Family history of multiple myeloma    GERD (gastroesophageal reflux disease)    Headache    Heart murmur    HLD (hyperlipidemia)    HTN (hypertension)    Hyperlipemia    Hyperlipidemia    Hypertension    Hypothyroid    Hypothyroidism    Iron (Fe) deficiency anemia    Monoclonal gammopathy    Pacemaker    Personal history of radiation therapy    Presence of permanent cardiac pacemaker    Shortness of breath dyspnea    Thrombocytopenia (HCC)    Vitamin D deficiency    Past Surgical History:  Past Surgical History:  Procedure Laterality Date   ABDOMINAL HYSTERECTOMY     Partial   ABLATION     APPENDECTOMY     BICEPT TENODESIS Right 01/07/2018   Procedure: BICEPS TENODESIS;  Surgeon: Leim Fabry, MD;  Location: [facility] OR[facility];  Service: Orthopedics;  Laterality: Right;   BREAST BIOPSY Right 2012   BREAST BIOPSY Right 07/21/2019   venus clip, [place] Bx,positive   BREAST LUMPECTOMY Right 2012   positive/rad   BREAST SURGERY     CARDIAC SURGERY     CARDIAC VALVE REPLACEMENT     COLONOSCOPY  2012   ELECTROPHYSIOLOGIC STUDY N/A 01/23/2015    Procedure: CARDIOVERSION;  Surgeon: Corey Skains, MD;  Location: [facility];  Service: Cardiovascular;  Laterality: N/A;   ELECTROPHYSIOLOGIC STUDY N/A 05/27/2016   Procedure: CARDIOVERSION;  Surgeon: Corey Skains, MD;  Location: [facility];  Service: Cardiovascular;  Laterality: N/A;   ESOPHAGOGASTRODUODENOSCOPY  2012   EYE SURGERY     IR CT HEAD LTD  03/10/2020   IR PERCUTANEOUS ART THROMBECTOMY/INFUSION INTRACRANIAL INC DIAG ANGIO  03/10/2020   IR US GUIDE VASC ACCESS RIGHT  03/10/2020   MASTECTOMY Right    Mastectomy Right    MASTECTOMY WITH AXILLARY LYMPH NODE DISSECTION Right 08/09/2019   Procedure: MASTECTOMY WITH AXILLARY LYMPH NODE DISSECTION;  Surgeon: Robert Bellow, MD;  Location: [facility];  Service: General;  Laterality: Right;   MITRAL VALVE REPLACEMENT     mvp     PACEMAKER PLACEMENT N/A    RADIOLOGY WITH ANESTHESIA N/A 03/10/2020   Procedure: IR WITH ANESTHESIA;  Surgeon: Radiologist, Medication, MD;  Location: [place];  Service: Radiology;  Laterality: N/A;   REVERSE SHOULDER ARTHROPLASTY Right 01/07/2018   Procedure: REVERSE SHOULDER ARTHROPLASTY;  Surgeon: Leim Fabry, MD;  Location: [facility];  Service: Orthopedics;  Laterality: Right;   VSD REPAIR  VSD REPAIR N/A    HPI:  Pt  is a 77 y.o. female with medical history significant of Dementia, hypertension, HLD, COPD on 2 L oxygen, hypothyroidism, depression, L MCA CVA, anxiety, pacemaker placement, primary biliary cirrhosis, breast cancer (s/p of right mastectomy and radiation therapy), dementia, dCHF, atrial fibrillation on Eliquis, CKD stage IIIb, who presents with shaking, fever.     Per her daughter at the bedside, patient was recently hospitalized to [facility] at 2/27 due to COVID infection.  Patient was treated with remdesivir and Decadron.  Patient was discharged to the nursing home.  Since this morning, patient was noted to be very shaky in all extremities, with fever and rigors. Her temperature is 102.0 in ED.  CXR:  Cardiomegaly with diffuse interstitial edema pattern and small effusions.     Assessment / Plan / Recommendation  Clinical Impression   Pt seen for BSE today. Pt awake, paucity of speech to direct questions. She appeared distracted, often staring elsewhere. She responded to verbal/tactile cues. Pt has a baseline of Dementia.  On  O2 2L; afebrile. WBC trending down.  Pt appears to present w/ MOD+ oropharyngeal phase dysphagia in setting of declined Cognitive status, possibly exacerbated in setting of acute illness. Pt has a Baseline dx of Dementia. 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  ANY Cognitive decline can impact overall awareness/timing of swallow and safety during po tasks which increases risk for aspiration, choking. Pt is also Edentulous w/ no Bottom Dentition; Top Denture plate present. She required MOD verbal/visual/tactile cues for follow through during po tasks.    Pt appears at high risk for aspiration/choking d/t current oropharyngeal phase deficits/dysphagia exhibited at this evaluation today.     Pt consumed trials of ice chips, purees, and Nectar liquids via TSP w/ No overt coughing or decline in respiratory status/vocal quality b/t trials. Multiple swallows were noted intermittently w/ trials given. O2 sats remained 98%. OF NOTE: pt exhibited MOD+ oral phase deficits c/b oral prep issues, decreased awareness of boluses orally, oral holding, and decreased lingual/labial attention to the boluses initially. MOD cues and support/guidance given to increased awareness of po trials/tasks; once aware of the po bolus, pt demonstrated functional bolus management for A-P transfer and swallow. Suspect oral phase presentation being impacted by pt's Cognitive decline and acute illness currently. OM Exam was  cursory but lingual ROM and strength and labial seal appeared WFL during movements w/ No unilateral weakness noted. Confusion of OM tasks and oral care noted.          In setting of current presentation as above and her risk for aspiration, recommend NPO status w/ frequent oral care for hygiene and stimulation of swallowing. ST services will continue to monitor pt's status and provide ongoing assessment in hopes to upgrade to least restrictive diet consistency safely. MD/NSG updated.  ST services recommends follow w/ Palliative Care for [place] and education re: impact of Cognitive decline/Dementia on swallowing. Precautions posted in room. SLP Visit Diagnosis: Dysphagia, oropharyngeal phase (R13.12) (baseline Dementia)    Aspiration Risk  Moderate aspiration risk;Risk for inadequate nutrition/hydration    Diet Recommendation   NPO w/ frequent oral care  Medication Administration: Via alternative means    Other  Recommendations Recommended Consults:  (Dietician f/u; Palliative Care f/u) Oral Care Recommendations: Oral care QID;Staff/trained caregiver to provide oral care  Recommendations for follow up therapy are one component of a multi-disciplinary discharge planning process, led by the attending physician.  Recommendations may be updated based on patient status, additional functional criteria and insurance authorization.  Follow up Recommendations Skilled nursing-short term rehab (<3 hours/day) (TBD)      Assistance Recommended at Discharge  full  Functional Status Assessment Patient has had a recent decline in their functional status and/or demonstrates limited ability to make significant improvements in function in a reasonable and predictable amount of time  Frequency and Duration min 2x/week  2 weeks       Prognosis Prognosis for improved oropharyngeal function: Fair Barriers to Reach Goals: Cognitive deficits;Language deficits;Time post onset;Severity of  deficits Barriers/Prognosis Comment: baseline Dementia      Swallow Study   General Date of Onset: 11/30/22 HPI: Pt  is a 77 y.o. female with medical history significant of Dementia, hypertension, HLD, COPD on 2 L oxygen, hypothyroidism, depression, L MCA CVA, anxiety, pacemaker placement, primary biliary cirrhosis, breast cancer (s/p of right mastectomy and radiation therapy), dementia, dCHF, atrial fibrillation on Eliquis, CKD stage IIIb, who presents with shaking, fever.     Per her daughter at the bedside, patient was recently hospitalized to [facility] at 2/27 due to COVID infection.  Patient was treated with remdesivir and Decadron.  Patient was discharged to the nursing home.  Since this morning, patient was noted to be very shaky in all extremities, with fever and rigors. Her temperature is 102.0 in ED.  CXR: Cardiomegaly with diffuse interstitial edema pattern and small effusions. Type of Study: Bedside Swallow Evaluation Previous Swallow Assessment: none Diet Prior to this Study: NPO;Thin liquids (Level 0) (had been on a regular diet) Temperature Spikes Noted: No (WBC 14.0 trending down) Respiratory Status: Nasal cannula (2L) History of Recent Intubation: No Behavior/Cognition: Alert;Cooperative;Pleasant mood;Confused;Distractible;Requires cueing (Dementia) Oral Cavity Assessment: Dry Oral Care Completed by SLP: Yes (attempted but confused) Oral Cavity - Dentition: Dentures, top (no bottom Dentition) Vision:  (n/a) Self-Feeding Abilities: Total assist Patient Positioning: Upright in bed (needed full positioning support) Baseline Vocal Quality: Low vocal intensity (but fully intelligible when she did respond) Volitional Cough: Cognitively unable to elicit Volitional Swallow: Unable to elicit    Oral/Motor/Sensory Function Overall Oral Motor/Sensory Function:  (appeared WFL during cusory lingual/labial movements during bolus management and licking lips)   Ice Chips Ice chips:  Impaired Presentation: Spoon (fed; 2 trials) Oral Phase Impairments: Reduced lingual movement/coordination;Poor awareness of bolus;Reduced labial seal (oral prep deficits) Oral Phase Functional Implications: Prolonged oral transit;Oral holding Pharyngeal Phase Impairments:  (none)   Thin Liquid Thin Liquid: Not tested    Nectar Thick Nectar Thick Liquid: Impaired Presentation: Spoon (fed; 5 trials) Oral Phase Impairments: Poor awareness of bolus;Reduced lingual movement/coordination;Reduced labial seal Oral phase functional implications: Prolonged oral transit;Oral holding (oral prep deficits) Pharyngeal Phase Impairments: Multiple swallows   Honey Thick Honey Thick Liquid: Not tested   Puree Puree: Impaired Presentation: Spoon (fed; 4 trials) Oral Phase Impairments: Poor awareness of bolus;Reduced lingual movement/coordination;Reduced labial seal Oral Phase Functional Implications: Oral holding;Prolonged oral transit (oral prep deficits) Pharyngeal Phase Impairments: Multiple swallows   Solid     Solid: Not tested         Orinda Kenner, MS, CCC-SLP Speech Language Pathologist Rehab Services; [place] [phone] (ascom) Destina Mantei 12/01/2022,4:39 PM

## 2022-12-01 NOTE — Progress Notes (Signed)
[name], pts daughter, notified of pt transfer to room 226.

## 2022-12-01 NOTE — Progress Notes (Signed)
Notified by CCMD that pt had V-tach episode with rate reaching 205 - MD notified.

## 2022-12-01 NOTE — Progress Notes (Signed)
PROGRESS NOTE    Adriana Martin  [id number] DOB: April 11, 1946 DOA: 11/30/2022 PCP: Sallee Lange, NP  [id number]  LOS: 1 day   Brief hospital course:   Assessment & Plan: Adriana Martin is a 77 y.o. female with medical history significant of hypertension, HLD, COPD on 2 L oxygen, hypothyroidism, depression, anxiety, pacemaker placement, primary biliary cirrhosis, breast cancer (s/p of right mastectomy and radiation therapy), dementia, dCHF, atrial fibrillation on Eliquis, CKD stage IIIb, who presents with shaking, fever.   Per her daughter at the bedside, patient was recently hospitalized to [facility] at 2/27 due to COVID infection.  Patient was treated with remdesivir and Decadron.  Patient was discharged to the nursing home.  Since this morning, patient was noted to be very shaky in all extremities, with fever and rigors. Her temperature is 102.0 in ED.    SIRS --WBC 13.7, fever 102.0, lactic acid initially > 9.0, which is normalized to 1.5 after giving 2 L IVF bolus. CT-renal stone is negative for kidney stone.  No obvious signs of PNA.  Pt was started on abx for presumed UTI, however, UA largely benign.  Procal 0.29.  blood cx neg so far.   Plan: --obtain RVP --add on urine culture --cont ceftriaxone for now   Severe stool impaction --may be the cause of pt's rectal temp of 102. --enema  Myocardial injury:  Troponin level 91, 100, 79, flat   Acute on chronic diastolic congestive heart failure ([place]): 2D echo on 11/18/2022 showed EF of 65-70%.  Patient has 1+ leg edema, elevated BNP 1044, interstitial pulm edema chest x-ray, clinically consistent with CHF exacerbation.   --s/p IV lasix --hold further diuretic for now   Chronic obstructive pulmonary disease (HCC) -Bronchodilators   Essential hypertension -Hold lisinopril    Atrial fibrillation ([place]):  -Continue Eliquis   Hx of Primary biliary cholangitis [facility]): Patient is still taking prednisone 5 mg daily  chronically. --started on stress dose steroid Plan: -Continue ursodiol  --no need for stress dose steroid, d/c --cont home prednisone   HLD (hyperlipidemia) -Pravastatin and Zetia   Breast cancer (HCC) -Tamoxifen   History of stroke -Pravastatin, Zetia and Eliquis   Hypothyroidism -Synthroid   Chronic kidney disease, stage 3a ([place]): Stable   Dementia (HCC) -[place]  Hypokalemia --replete with IV potassium while NPO  Acute on chronic hypoxia --on presentation, oxygen desaturated to 86% on home level 2 L oxygen, which improved to 94% on 4 L oxygen.    DVT prophylaxis: SCD/Compression stockings Code Status: DNR  Family Communication: daughter updated at bedside today Level of care: Med-Surg Dispo:   The patient is from: SNF LTC Anticipated d/c is to: SNF LTC Anticipated d/c date is: 1-2 days   Subjective and Interval History:  Pt was confused and answered I don't know to most questions.     Objective: Vitals:   12/01/22 0827 12/01/22 1309 12/01/22 1514 12/01/22 1655  BP:  (!) 137/57  (!) 142/61  Pulse:  85  85  Resp:  20  19  Temp:  98.1 F (36.7 C)  98.8 F (37.1 C)  TempSrc:  Oral  Axillary  SpO2: 96% 91% 95% 98%  Weight:        Intake/Output Summary (Last 24 hours) at 12/01/2022 1753 Last data filed at 12/01/2022 1601 Gross per 24 hour  Intake 300.16 ml  Output 250 ml  Net 50.16 ml   Filed Weights   11/30/22 1400 12/01/22 0332  Weight: 71.2 kg 69.3 kg  Examination:   Constitutional: NAD, awake, not oriented HEENT: conjunctivae and lids normal, EOMI CV: No cyanosis.   RESP: normal respiratory effort, on 2L Extremities: No effusions, edema in BLE SKIN: warm, dry Neuro: II - XII grossly intact.   Psych: depressed mood and affect.     Data Reviewed: I have personally reviewed labs and imaging studies  Time spent: 50 minutes  Enzo Bi, MD [facility] If 7PM-7AM, please contact night-coverage 12/01/2022, 5:53 PM

## 2022-12-02 DIAGNOSIS — A419 Sepsis, unspecified organism: Secondary | ICD-10-CM | POA: Diagnosis not present

## 2022-12-02 DIAGNOSIS — R6520 Severe sepsis without septic shock: Secondary | ICD-10-CM | POA: Diagnosis not present

## 2022-12-02 LAB — BASIC METABOLIC PANEL
Anion gap: 11 (ref 5–15)
BUN: 24 mg/dL — ABNORMAL HIGH (ref 8–23)
CO2: 27 mmol/L (ref 22–32)
Calcium: 8.6 mg/dL — ABNORMAL LOW (ref 8.9–10.3)
Chloride: 105 mmol/L (ref 98–111)
Creatinine, Ser: 1.18 mg/dL — ABNORMAL HIGH (ref 0.44–1.00)
GFR, Estimated: 48 mL/min — ABNORMAL LOW (ref >60)
Glucose, Bld: 100 mg/dL — ABNORMAL HIGH (ref 70–99)
Potassium: 3 mmol/L — ABNORMAL LOW (ref 3.5–5.1)
Sodium: 143 mmol/L (ref 135–145)

## 2022-12-02 LAB — CBC
HCT: 34.8 % — ABNORMAL LOW (ref 36.0–46.0)
Hemoglobin: 10.9 g/dL — ABNORMAL LOW (ref 12.0–15.0)
MCH: 27.9 pg (ref 26.0–34.0)
MCHC: 31.3 g/dL (ref 30.0–36.0)
MCV: 89 fL (ref 80.0–100.0)
Platelets: 275 10*3/uL (ref 150–400)
RBC: 3.91 MIL/uL (ref 3.87–5.11)
RDW: 15.2 % (ref 11.5–15.5)
WBC: 22.5 10*3/uL — ABNORMAL HIGH (ref 4.0–10.5)
nRBC: 0 % (ref 0.0–0.2)

## 2022-12-02 LAB — MRSA NEXT GEN BY PCR, NASAL: MRSA by PCR Next Gen: NOT DETECTED

## 2022-12-02 LAB — MAGNESIUM: Magnesium: 1.9 mg/dL (ref 1.7–2.4)

## 2022-12-02 MED ORDER — METRONIDAZOLE 500 MG/100ML IV SOLN
500.0000 mg | Freq: Two times a day (BID) | INTRAVENOUS | Status: DC
  Administered 2022-12-02 – 2022-12-04 (×5): 500 mg via INTRAVENOUS
  Filled 2022-12-02 (×5): qty 100

## 2022-12-02 MED ORDER — POTASSIUM CHLORIDE 10 MEQ/100ML IV SOLN
10.0000 meq | INTRAVENOUS | Status: AC
  Administered 2022-12-02 (×6): 10 meq via INTRAVENOUS
  Filled 2022-12-02 (×5): qty 100

## 2022-12-02 NOTE — NC FL2 (Signed)
[place] LEVEL OF CARE FORM     IDENTIFICATION  [name] Birthdate: 11/08/1945 Sex: female Admission Date (Current Location): 11/30/2022  [place] and [place] Number:  Engineering geologist and Address:  [facility], [address], [place], [place] [address]      Provider Number: [id number]  Attending Physician Name and Address:  Enzo Bi, MD  Relative Name and Phone Number:  [name] (daughter)  [phone]    Current Level of Care: Hospital Recommended Level of Care: [place] Prior Approval Number:    Date Approved/Denied:   PASRR Number: [id number]  Discharge Plan: SNF    Current Diagnoses: Patient Active Problem List   Diagnosis Date Noted   Myocardial injury 11/30/2022   Chronic kidney disease, stage 3a ([place]) 11/30/2022   Hypothyroidism 11/30/2022   UTI (urinary tract infection) 11/30/2022   Diarrhea 01/12/2022   Severe sepsis ([place]) 01/10/2022   Laceration of left forearm 01/10/2022   Severe sepsis with acute organ dysfunction ([place]) 01/09/2022   Weakness    Acute respiratory failure with hypoxia [facility])    Cardiac arrest ([place])    Epigastric pain    CHF exacerbation ([place]) 04/19/2021   Colitis 04/05/2021   Acute colitis 04/04/2021   Acute kidney injury superimposed on CKD ([place]) 04/04/2021   HTN (hypertension) 04/04/2021   Atrial fibrillation, chronic ([place]) 04/04/2021   Chronic diastolic CHF (congestive heart failure) ([place]) 04/04/2021   Rectal bleeding 04/04/2021   COPD (chronic obstructive pulmonary disease) ([place]) 04/04/2021   HLD (hyperlipidemia) 04/04/2021   Hypothyroid 04/04/2021   Cirrhosis ([place]) 04/04/2021   Breast cancer ([place]) 04/04/2021   Dementia ([place]) 04/04/2021   History of radiation therapy 04/02/2021   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   Senile purpura ([place]) 10/30/2020   Acquired thrombophilia ([place]) 06/13/2020   Essential hypertension 03/14/2020    Hyperlipidemia 03/14/2020   Acute ischemic stroke ([place]) L MCA s/p mechanical thrombectomy, embolic d/t AF not on [facility] 03/10/2020   History of stroke 03/10/2020   Osteopenia 01/21/2020   Family history of breast cancer    Family history of multiple myeloma    Family history of esophageal cancer    Displaced comminuted fracture of shaft of right humerus 12/05/2018   Recurrent falls 12/05/2018   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/2018   MGUS (monoclonal gammopathy of unknown significance) 12/14/2017   Imbalance 06/24/2017   Tremor 06/24/2017   ICD (implantable cardioverter-defibrillator), biventricular, 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   Carcinoma of overlapping sites of right breast in female, estrogen receptor positive ([place]) 06/16/2016   Acute exacerbation of CHF (congestive heart failure) ([place]) 06/07/2016   A-fib ([place]) 04/08/2016   COPD exacerbation ([place]) 03/22/2016   Acute bronchitis 03/22/2016   Atrial fibrillation with RVR ([place]) 03/22/2016   Chronic renal insufficiency 03/22/2016   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/30/2016   Cough with expectoration 06/04/2015   Essential thrombocytosis ([place]) 02/01/2015   Anemia 02/01/2015   Atrial flutter ([place]) 01/23/2015   Atrial fibrillation ([place]) 01/23/2015   Other long term (current) drug therapy 11/06/2014   High risk medication use 11/06/2014   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   Acute on chronic diastolic congestive heart failure ([place]) 08/01/2014   Severe mitral insufficiency 08/01/2014   Long term current use of anticoagulant 06/06/2014   S/P mitral valve repair 06/06/2014   Anxiety 05/13/2014   Chronic obstructive pulmonary disease ([place]) 03/22/2014   Moderate episode of recurrent major depressive disorder ([place]) 03/22/2014  Primary biliary cholangitis ([place]) 02/19/2014   Avitaminosis D 02/19/2014   GERD (gastroesophageal reflux disease) 02/19/2014    Orientation RESPIRATION BLADDER Height & Weight      (Disoriented x  4)  O2 (Nasal Cannula 2 L) Incontinent, External catheter Weight: 151 lb 10.8 oz (68.8 kg) Height:     BEHAVIORAL SYMPTOMS/MOOD NEUROLOGICAL BOWEL NUTRITION STATUS  Other (Comment) (Calm, not interactive.)  (Dementia) Incontinent Diet (See recommendations once discharge summary is available. Currently NPO.)  AMBULATORY STATUS COMMUNICATION OF NEEDS Skin     Verbally Bruising, Other (Comment) (Erythema/redness.)                       Personal Care Assistance Level of Assistance              Functional Limitations Info  Sight, Speech, Hearing Sight Info: Adequate Hearing Info: Adequate Speech Info: Adequate    SPECIAL CARE FACTORS FREQUENCY                       Contractures Contractures Info: Not present    Additional Factors Info  Code Status, Allergies Code Status Info: DNR Allergies Info: Atorvastatin, Calcium, Fluoxetine, Hydrochlorothiazide           Current Medications (12/02/2022):  This is the current hospital active medication list Current Facility-Administered Medications  Medication Dose Route Frequency Provider Last Rate Last Admin   acetaminophen (TYLENOL) suppository 650 mg  650 mg Rectal Q6H PRN Ivor Costa, MD       acetaminophen (TYLENOL) tablet 650 mg  650 mg Oral Q6H PRN Ivor Costa, MD       albuterol (PROVENTIL) (2.5 MG/3ML) 0.083% nebulizer solution 2.5 mg  2.5 mg Nebulization Q4H PRN Ivor Costa, MD       apixaban Arne [place]) tablet 5 mg  5 mg Oral BID Ivor Costa, MD   5 mg at 11/30/22 2135   cefTRIAXone (ROCEPHIN) 2 g in sodium chloride 0.9 % 100 mL IVPB  2 g Intravenous Q24H Ivor Costa, MD 200 mL/hr at 12/02/22 0533 2 g at 12/02/22 0533   dextromethorphan-guaiFENesin ([place] DM) 30-600 MG per 12 hr tablet 1 tablet  1 tablet Oral BID PRN Ivor Costa, MD       ezetimibe (ZETIA) tablet 10 mg  10 mg Oral Daily Ivor Costa, MD       hydrALAZINE (APRESOLINE) injection 5 mg  5 mg Intravenous Q2H PRN Ivor Costa, MD       ipratropium-albuterol  (DUONEB) 0.5-2.5 (3) MG/3ML nebulizer solution 3 mL  3 mL Nebulization TID Ivor Costa, MD   3 mL at 12/02/22 0809   levothyroxine (SYNTHROID) tablet 50 mcg  50 mcg Oral QAC breakfast Ivor Costa, MD   50 mcg at 12/01/22 [id number]   melatonin tablet 5 mg  5 mg Oral QHS Ivor Costa, MD   5 mg at 11/30/22 2135   memantine (NAMENDA) tablet 5 mg  5 mg Oral BID Ivor Costa, MD       mometasone-formoterol [facility]) 100-5 MCG/ACT inhaler 2 puff  2 puff Inhalation BID Ivor Costa, MD   2 puff at 12/02/22 0831   multivitamin with minerals tablet 1 tablet  1 tablet Oral Daily Ivor Costa, MD       ondansetron [facility]) injection 4 mg  4 mg Intravenous Q8H PRN Ivor Costa, MD       Oral care mouth rinse  15 mL Mouth Rinse 4 times per day Enzo Bi, MD  15 mL at 12/01/22 2100   Oral care mouth rinse  15 mL Mouth Rinse PRN Enzo Bi, MD       pantoprazole (PROTONIX) injection 40 mg  40 mg Intravenous Q12H Enzo Bi, MD   40 mg at 12/02/22 [id number]   pravastatin (PRAVACHOL) tablet 40 mg  40 mg Oral QHS Ivor Costa, MD       predniSONE (DELTASONE) tablet 5 mg  5 mg Oral Q breakfast Enzo Bi, MD       tamoxifen (NOLVADEX) tablet 20 mg  20 mg Oral Daily Ivor Costa, MD       ursodiol (ACTIGALL) capsule 600 mg  600 mg Oral BID Ivor Costa, MD       venlafaxine XR (EFFEXOR-XR) 24 hr capsule 75 mg  75 mg Oral Daily Ivor Costa, MD         Discharge Medications: Please see discharge summary for a list of discharge medications.  Relevant Imaging Results:  Relevant Lab Results:   Additional Information SS#: [id number]  Candie Chroman, LCSW

## 2022-12-02 NOTE — Progress Notes (Signed)
PROGRESS NOTE    Adriana Martin  [id number] DOB: October 17, 1945 DOA: 11/30/2022 PCP: Sallee Lange, NP  226A/226A-AA  LOS: 2 days   Brief hospital course:   Assessment & Plan: Adriana Martin is a 77 y.o. female with medical history significant of hypertension, HLD, COPD on 2 L oxygen, hypothyroidism, depression, anxiety, pacemaker placement, primary biliary cirrhosis, breast cancer (s/p of right mastectomy and radiation therapy), dementia, dCHF, atrial fibrillation on Eliquis, CKD stage IIIb, who presents with shaking, fever.   Per her daughter at the bedside, patient was recently hospitalized to [facility] at 2/27 due to COVID infection.  Patient was treated with remdesivir and Decadron.  Patient was discharged to the nursing home.  Since this morning, patient was noted to be very shaky in all extremities, with fever and rigors. Her temperature is 102.0 in ED.    SIRS --WBC 13.7, fever 102.0, lactic acid initially > 9.0, which is normalized to 1.5 after giving 2 L IVF bolus. CT-renal stone is negative for kidney stone.  No obvious signs of PNA.  Pt was started on abx for presumed UTI, however, UA largely benign.  Procal 0.29.  blood cx neg so far.  RVP neg. --WBC increased today Plan: --cont ceftriaxone for now --add flagyl to cover GI source of infection   Severe stool impaction --may be the cause of pt's rectal temp of 102. --s/p enema resulting in subsequent BM's (blood streaks and mucus could be consequence of prolonged impaction and tissue injuries).  Myocardial injury:  Troponin level 91, 100, 79, flat   Acute on chronic diastolic congestive heart failure ([place]): 2D echo on 11/18/2022 showed EF of 65-70%.  Patient has 1+ leg edema, elevated BNP 1044, interstitial pulm edema chest x-ray, clinically consistent with CHF exacerbation.   --s/p IV lasix --hold further diuretic for now   Chronic obstructive pulmonary disease (HCC) -Bronchodilators   Essential  hypertension -Hold lisinopril    Atrial fibrillation ([place]):  -Continue Eliquis   Hx of Primary biliary cholangitis [facility]): Patient is still taking prednisone 5 mg daily chronically. --started on stress dose steroid on admission, since d/c'ed. Plan: -Continue ursodiol  --cont home prednisone   HLD (hyperlipidemia) -Pravastatin and Zetia   Breast cancer (HCC) -Tamoxifen   History of stroke -Pravastatin, Zetia and Eliquis   Hypothyroidism -Synthroid   Chronic kidney disease, stage 3a ([place]): Stable   Dementia (HCC) -Namenda  Hypokalemia --replete with IV potassium while NPO  Acute on chronic hypoxia --on presentation, oxygen desaturated to 86% on home level 2 L oxygen, which improved to 94% on 4 L oxygen.   Dysphagia --due to mental status, dementia, leading to unawareness. --SLP eval, pt improved today, resumed on dys 1 diet with thin fluid.   DVT prophylaxis: SCD/Compression stockings Code Status: DNR  Family Communication:  Level of care: Med-Surg Dispo:   The patient is from: SNF LTC Anticipated d/c is to: SNF LTC Anticipated d/c date is: 1-2 days   Subjective and Interval History:  Pt was awake but still confused.  RN reported multiple BM's, with blood streaks and mucus.    SLP re-eval found pt doing much better in terms oral awareness.     Objective: Vitals:   12/02/22 0555 12/02/22 0752 12/02/22 0809 12/02/22 1314  BP: (!) 139/57 (!) 130/50    Pulse: 83 66    Resp: 20 20    Temp: 97.8 F (36.6 C) 98 F (36.7 C)    TempSrc: Oral     SpO2:  97% 100% 97% 97%  Weight:        Intake/Output Summary (Last 24 hours) at 12/02/2022 1940 Last data filed at 12/02/2022 1938 Gross per 24 hour  Intake 660 ml  Output 270 ml  Net 390 ml   Filed Weights   11/30/22 1400 12/01/22 0332 12/02/22 0507  Weight: 71.2 kg 69.3 kg 68.8 kg    Examination:   Constitutional: NAD, alert, not oriented HEENT: conjunctivae and lids normal, EOMI CV: No cyanosis.    RESP: normal respiratory effort SKIN: warm, dry   Data Reviewed: I have personally reviewed labs and imaging studies  Time spent: 35 minutes  Enzo Bi, MD [facility] If 7PM-7AM, please contact night-coverage 12/02/2022, 7:40 PM

## 2022-12-02 NOTE — Progress Notes (Signed)
Speech Language Pathology Treatment: Dysphagia  Patient Details Name: Adriana Martin MRN: [id number] DOB: 01-25-1946 Today's Date: 12/02/2022 Time: [id number] SLP Time Calculation (min) (ACUTE ONLY): 40 min  Assessment / Plan / Recommendation Clinical Impression  Pt seen for ongoing assessment of swallowing today; trials to upgrade to oral diet hopefully. Pt awake, paucity of speech to direct questions. She appeared distracted, decreased awareness during po tasks. She responded to verbal/tactile cues. Pt has a baseline of Dementia.  On [place] O2 2L; afebrile.    Pt appears to present w/ MOD oropharyngeal phase dysphagia in setting of declined Cognitive status, possibly exacerbated in setting of acute illness. Pt has a Baseline dx of Dementia. 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  ANY Cognitive decline can impact overall awareness/timing of swallow and safety during po tasks which increases risk for aspiration, choking. Pt is also Edentulous w/ no Bottom Dentition; not wearing Top Denture this session. She required MOD verbal/visual/tactile cues for follow through during po tasks.    Pt appears at increased risk for aspiration/choking in setting of current oropharyngeal phase deficits/dysphagia exhibited and Baseline comorbidities.     Pt consumed trials of ice chips, purees, and Nectar and Thin liquids via TSP then straw w/ No overt coughing or decline in respiratory status/vocal quality b/t trials. F/u dry swallow was noted intermittently w/ trials given. O2 sats remained 98%. OF NOTE: pt's oral phase management of boluses was improved this session today -- much less oral prep issues and oral holding of boluses noted. Pt continued to exhibit decreased awareness during po tasks requiring verbal cues and min lengthy  oral phase during bolus management. Less cues required to redirect attention to po boluses and to support/guide awareness of oral prep stage of oral intake. Pt demonstrated functional bolus management for A-P transfer then pharyngeal phase swallowing today at this session. Suspect oral phase presentation being impacted by pt's Cognitive decline and acute illness currently. Better follow through w/ oral care today.           In setting of current presentation as above and her risk for aspiration, recommend a dysphagia level 1 diet (puree) w/ thin liquids w/ careful monitoring during sips of thin liquids. Recommend full feeding assistance and cues for follow through during po tasks; reduce distractions during meals. 100% Supervision at meals. Recommend general aspiration precautions. Pills in Puree - Crushed as able. ST services will monitor pt's status and provide ongoing education. Suspect pt could be at/close to her baseline in setting of her Dementia. MD/NSG updated.  ST services recommends follow w/ Palliative Care for [place] and education re: impact of Cognitive decline/Dementia on swallowing. Precautions posted in room.      HPI HPI: Pt  is a 77 y.o. female with medical history significant of Dementia, hypertension, HLD, COPD on 2 L oxygen, hypothyroidism, depression, L MCA CVA, anxiety, pacemaker placement, primary biliary cirrhosis, breast cancer (s/p of right mastectomy and radiation therapy), dementia, dCHF, atrial fibrillation on Eliquis, CKD stage IIIb, who presents with shaking, fever.     Per her daughter at the bedside, patient was recently hospitalized to [facility] at 2/27 due to COVID infection.  Patient was treated with remdesivir and Decadron.  Patient was discharged to the nursing home.  Since this morning, patient was noted to be very shaky in all extremities, with fever and  rigors. Her temperature is 102.0 in ED.  CXR: Cardiomegaly with diffuse interstitial edema pattern and small effusions.       SLP Plan  Continue with current plan of care      Recommendations for follow up therapy are one component of a multi-disciplinary discharge planning process, led by the attending physician.  Recommendations may be updated based on patient status, additional functional criteria and insurance authorization.    Recommendations  Diet recommendations: Dysphagia 1 (puree);Thin liquid Liquids provided via: Cup;Straw Medication Administration: Crushed with puree Supervision: Staff to assist with self feeding;Full supervision/cueing for compensatory strategies Compensations: Minimize environmental distractions;Slow rate;Small sips/bites;Lingual sweep for clearance of pocketing;Follow solids with liquid;Multiple dry swallows after each bite/sip Postural Changes and/or Swallow Maneuvers: Out of bed for meals;Seated upright 90 degrees;Upright 30-60 min after meal                General recommendations:  (Dietician f/u; Palliative Care f/u) Oral Care Recommendations: Oral care BID;Oral care before and after PO;Staff/trained caregiver to provide oral care ([place] care) Follow Up Recommendations: Skilled nursing-short term rehab (<3 hours/day) (TBD) Assistance recommended at discharge: Frequent or constant Supervision/Assistance SLP Visit Diagnosis: Dysphagia, oropharyngeal phase (R13.12) (baseline Dementia) Plan: Continue with current plan of care             Orinda Kenner, [place], [place]; [place] [phone] (ascom) Sherl Yzaguirre  12/02/2022, 5:21 PM

## 2022-12-03 LAB — BASIC METABOLIC PANEL
Anion gap: 8 (ref 5–15)
BUN: 27 mg/dL — ABNORMAL HIGH (ref 8–23)
CO2: 26 mmol/L (ref 22–32)
Calcium: 8.6 mg/dL — ABNORMAL LOW (ref 8.9–10.3)
Chloride: 106 mmol/L (ref 98–111)
Creatinine, Ser: 1.19 mg/dL — ABNORMAL HIGH (ref 0.44–1.00)
GFR, Estimated: 47 mL/min — ABNORMAL LOW (ref >60)
Glucose, Bld: 139 mg/dL — ABNORMAL HIGH (ref 70–99)
Potassium: 3.6 mmol/L (ref 3.5–5.1)
Sodium: 140 mmol/L (ref 135–145)

## 2022-12-03 LAB — CBC
HCT: 31.7 % — ABNORMAL LOW (ref 36.0–46.0)
Hemoglobin: 9.9 g/dL — ABNORMAL LOW (ref 12.0–15.0)
MCH: 28 pg (ref 26.0–34.0)
MCHC: 31.2 g/dL (ref 30.0–36.0)
MCV: 89.5 fL (ref 80.0–100.0)
Platelets: 233 10*3/uL (ref 150–400)
RBC: 3.54 MIL/uL — ABNORMAL LOW (ref 3.87–5.11)
RDW: 15.4 % (ref 11.5–15.5)
WBC: 21.5 10*3/uL — ABNORMAL HIGH (ref 4.0–10.5)
nRBC: 0.1 % (ref 0.0–0.2)

## 2022-12-03 LAB — MAGNESIUM: Magnesium: 2.1 mg/dL (ref 1.7–2.4)

## 2022-12-03 LAB — PROCALCITONIN: Procalcitonin: 0.4 ng/mL

## 2022-12-03 MED ORDER — POLYETHYLENE GLYCOL 3350 17 G PO PACK
17.0000 g | PACK | Freq: Two times a day (BID) | ORAL | Status: DC
  Administered 2022-12-03 – 2022-12-04 (×2): 17 g via ORAL
  Filled 2022-12-03 (×2): qty 1

## 2022-12-03 MED ORDER — IPRATROPIUM-ALBUTEROL 0.5-2.5 (3) MG/3ML IN SOLN
3.0000 mL | Freq: Two times a day (BID) | RESPIRATORY_TRACT | Status: DC
  Administered 2022-12-03: 3 mL via RESPIRATORY_TRACT
  Filled 2022-12-03: qty 3

## 2022-12-03 NOTE — TOC Progression Note (Signed)
Transition of Care [facility]) - Progression Note    Patient Details  Name: Adriana Martin MRN: [id number] Date of Birth: 10-15-1945  Transition of [facility]) CM/SW Contact  Beverly Sessions, RN Phone Number: 12/03/2022, 3:50 PM  Clinical Narrative:    Confirmed with Tammy at [facility] that patient can return tomorrow    Expected Discharge Plan: [place] Barriers to Discharge: Continued Medical Work up  Expected Discharge Plan and Services       Living arrangements for the past 2 months: [place]                                       Social Determinants of Health (SDOH) Interventions SDOH Screenings   Food Insecurity: No Food Insecurity (11/30/2022)  Housing: [place]  (11/30/2022)  Transportation Needs: No Transportation Needs (11/30/2022)  Utilities: Not At Risk (11/30/2022)  Depression (PHQ2-9): Low Risk  (10/17/2021)  Tobacco Use: Low Risk  (11/30/2022)    Readmission Risk Interventions    01/14/2022   10:57 AM 04/06/2021   10:09 AM  Readmission Risk Prevention Plan  Transportation Screening Complete Complete  PCP or Specialist Appt within 3-5 Days  Complete  HRI or [place]  Complete  Social Work Consult for [place] Planning/Counseling  Complete  Palliative Care Screening  Complete  Medication Review Press photographer) Complete Complete  HRI or [place] Complete   SW Recovery Care/Counseling Consult Complete   [place] Not Applicable

## 2022-12-03 NOTE — Progress Notes (Signed)
PROGRESS NOTE    Adriana Martin  [id number] DOB: Nov 24, 1945 DOA: 11/30/2022 PCP: Sallee Lange, NP  226A/226A-AA  LOS: 3 days   Brief hospital course:   Assessment & Plan: Adriana Martin is a 77 y.o. female with medical history significant of hypertension, HLD, COPD on 2 L oxygen, hypothyroidism, depression, anxiety, pacemaker placement, primary biliary cirrhosis, breast cancer (s/p of right mastectomy and radiation therapy), dementia, dCHF, atrial fibrillation on Eliquis, CKD stage IIIb, who presents with shaking, fever.   Per her daughter at the bedside, patient was recently hospitalized to [facility] at 2/27 due to COVID infection.  Patient was treated with remdesivir and Decadron.  Patient was discharged to the nursing home.  Since this morning, patient was noted to be very shaky in all extremities, with fever and rigors. Her temperature is 102.0 in ED.    SIRS --WBC 13.7, fever 102.0, lactic acid initially > 9.0, which is normalized to 1.5 after giving 2 L IVF bolus. CT-renal stone is negative for kidney stone.  No obvious signs of PNA.  Pt was started on abx for presumed UTI, however, UA largely benign.  Procal 0.29.  blood cx neg so far.  RVP neg. --WBC increased  Plan: --cont empiric ceftriaxone and flagyl   Leukocytosis --no obvious source of infection, and WBC increasing despite being on broad-spectrum abx.  Leukocytosis may be due to the high-dose steroid given on admission. --monitor  Severe stool impaction --may be the cause of pt's rectal temp of 102. --s/p enema resulting in subsequent BM's (blood streaks and mucus could be consequence of prolonged impaction and tissue injuries). --Miralax BID scheduled  Myocardial injury:  Troponin level 91, 100, 79, flat   Acute on chronic diastolic congestive heart failure ([place]): 2D echo on 11/18/2022 showed EF of 65-70%.  Patient has 1+ leg edema, elevated BNP 1044, interstitial pulm edema chest x-ray, clinically  consistent with CHF exacerbation.   --s/p IV lasix --hold further diuretic for now   Chronic obstructive pulmonary disease (HCC) -Bronchodilators   Essential hypertension -Hold lisinopril    Atrial fibrillation ([place]):  -Continue Eliquis   Hx of Primary biliary cholangitis [facility]): Patient is still taking prednisone 5 mg daily chronically. --started on stress dose steroid on admission, since d/c'ed. Plan: -Continue ursodiol  --cont home prednisone   HLD (hyperlipidemia) -Pravastatin and Zetia   Breast cancer (HCC) -Tamoxifen   History of stroke -Pravastatin, Zetia and Eliquis   Hypothyroidism -Synthroid   Chronic kidney disease, stage 3a ([place]): Stable   Dementia (HCC) -Namenda  Hypokalemia --monitor and replete PRN  Chronic hypoxemic respiratory failure on 2-3L at baseline Ruled out acute --cont home 2-3L O2  Dysphagia --due to mental status, dementia, leading to unawareness. --SLP eval, pt improved on 3/13, resumed on dys 1 diet with thin fluid.   DVT prophylaxis: SCD/Compression stockings Code Status: DNR  Family Communication: daughter updated at bedside today Level of care: Med-Surg Dispo:   The patient is from: SNF LTC Anticipated d/c is to: SNF LTC Anticipated d/c date is: tomorrow   Subjective and Interval History:  Pt still answered questions with "I don't know".   Objective: Vitals:   12/03/22 0459 12/03/22 0500 12/03/22 0745 12/03/22 1518  BP: (!) 165/48  (!) 153/65 (!) 136/55  Pulse: 65  65 85  Resp: '18  18 18  '$ Temp: 98.3 F (36.8 C)  97.9 F (36.6 C) 98 F (36.7 C)  TempSrc: Oral     SpO2: 97%  98% 95%  Weight:  69.5 kg      Intake/Output Summary (Last 24 hours) at 12/03/2022 1847 Last data filed at 12/03/2022 0546 Gross per 24 hour  Intake 320 ml  Output 200 ml  Net 120 ml   Filed Weights   12/01/22 0332 12/02/22 0507 12/03/22 0500  Weight: 69.3 kg 68.8 kg 69.5 kg    Examination:   Constitutional: NAD, alert, not  oriented HEENT: conjunctivae and lids normal, EOMI CV: No cyanosis.   RESP: normal respiratory effort, on 2L GI: No tenderness to palpation of abdomen SKIN: warm, dry   Data Reviewed: I have personally reviewed labs and imaging studies  Time spent: 35 minutes  Enzo Bi, MD [facility] If 7PM-7AM, please contact night-coverage 12/03/2022, 6:47 PM

## 2022-12-03 NOTE — TOC Progression Note (Signed)
Transition of Care [facility]) - Progression Note    Patient Details  Name: Alys Huerta MRN: [id number] Date of Birth: [birth year]  Transition of Care [facility]) CM/SW Contact  Beverly Sessions, RN Phone Number: 12/03/2022, 10:28 AM  Clinical Narrative:     MD notified DNR on chart to be signed   Expected Discharge Plan: [place] Barriers to Discharge: Continued Medical Work up  Expected Discharge Plan and [place] arrangements for the past 2 months: [place]                                       Social Determinants of Health (SDOH) Interventions SDOH Screenings   Food Insecurity: No Food Insecurity (11/30/2022)  Housing: Low Risk  (11/30/2022)  Transportation Needs: No Transportation Needs (11/30/2022)  Utilities: Not At Risk (11/30/2022)  Depression (PHQ2-9): Low Risk  (10/17/2021)  Tobacco Use: Low Risk  (11/30/2022)    Readmission Risk Interventions    01/14/2022   10:57 AM 04/06/2021   10:09 AM  Readmission Risk Prevention Plan  Transportation Screening Complete Complete  PCP or Specialist Appt within 3-5 Days  Complete  HRI or [place]  Complete  Social Work Consult for [place] Planning/Counseling  Complete  Palliative Care Screening  Complete  Medication Review Press photographer) Complete Complete  HRI or [place] Complete   SW Recovery Care/Counseling Consult Complete   [place] Not Applicable

## 2022-12-03 NOTE — Care Management Important Message (Signed)
Important Message  Patient Details  Name: Adriana Martin MRN: [id number] Date of Birth: [birth year]   Medicare Important Message Given:  Yes     Dannette Barbara 12/03/2022, 2:27 PM

## 2022-12-04 LAB — BASIC METABOLIC PANEL
Anion gap: 14 (ref 5–15)
BUN: 33 mg/dL — ABNORMAL HIGH (ref 8–23)
CO2: 26 mmol/L (ref 22–32)
Calcium: 9 mg/dL (ref 8.9–10.3)
Chloride: 103 mmol/L (ref 98–111)
Creatinine, Ser: 1.2 mg/dL — ABNORMAL HIGH (ref 0.44–1.00)
GFR, Estimated: 47 mL/min — ABNORMAL LOW (ref >60)
Glucose, Bld: 118 mg/dL — ABNORMAL HIGH (ref 70–99)
Potassium: 3.2 mmol/L — ABNORMAL LOW (ref 3.5–5.1)
Sodium: 143 mmol/L (ref 135–145)

## 2022-12-04 LAB — CBC
HCT: 33.2 % — ABNORMAL LOW (ref 36.0–46.0)
Hemoglobin: 10.2 g/dL — ABNORMAL LOW (ref 12.0–15.0)
MCH: 27.8 pg (ref 26.0–34.0)
MCHC: 30.7 g/dL (ref 30.0–36.0)
MCV: 90.5 fL (ref 80.0–100.0)
Platelets: 204 10*3/uL (ref 150–400)
RBC: 3.67 MIL/uL — ABNORMAL LOW (ref 3.87–5.11)
RDW: 15.5 % (ref 11.5–15.5)
WBC: 15.7 10*3/uL — ABNORMAL HIGH (ref 4.0–10.5)
nRBC: 0.3 % — ABNORMAL HIGH (ref 0.0–0.2)

## 2022-12-04 LAB — MAGNESIUM: Magnesium: 1.9 mg/dL (ref 1.7–2.4)

## 2022-12-04 MED ORDER — ENSURE ENLIVE PO LIQD: 237.0000 mL | Freq: Two times a day (BID) | ORAL | 12 refills | Status: DC

## 2022-12-04 MED ORDER — POLYETHYLENE GLYCOL 3350 17 G PO PACK: 17.0000 g | PACK | Freq: Two times a day (BID) | ORAL | 0 refills | Status: DC

## 2022-12-04 MED ORDER — POTASSIUM CHLORIDE 20 MEQ PO PACK
40.0000 meq | PACK | Freq: Once | ORAL | Status: AC
  Administered 2022-12-04: 40 meq via ORAL
  Filled 2022-12-04: qty 2

## 2022-12-04 MED ORDER — IPRATROPIUM-ALBUTEROL 0.5-2.5 (3) MG/3ML IN SOLN: 3.0000 mL | Freq: Four times a day (QID) | RESPIRATORY_TRACT | Status: DC | PRN

## 2022-12-04 NOTE — Progress Notes (Signed)
Report called to Gilchrist at [employer]. All questions and concerns addressed at this time.

## 2022-12-04 NOTE — Evaluation (Signed)
Occupational Therapy Evaluation Patient Details Name: Adriana Martin MRN: [id number] DOB: Apr 04, 1946 Today's Date: 12/04/2022   History of Present Illness presented to ER secondary to shaking, fever; admitted for management of severe sepsis related to UTI.   Clinical Impression   Chart reviewed, co tx completed with PT on this date. Pt is oriented to self only, inconsistent one step direction following with increased time required throughout. PTA per chart pt is from [facility], recently started therapy services. Pt presents with deficits in strength, endurance,activity tolerance, balance, cognition all affecting safe and optimal ADL completion. Pt wil benefit from skilled OT to address functional deficits and to improve current level of functioning. OT will continue to follow acutely.      Recommendations for follow up therapy are one component of a multi-disciplinary discharge planning process, led by the attending physician.  Recommendations may be updated based on patient status, additional functional criteria and insurance authorization.   Follow Up Recommendations  Skilled nursing-short term rehab (<3 hours/day)     Assistance Recommended at Discharge Frequent or constant Supervision/Assistance  Patient can return home with the following A lot of help with bathing/dressing/bathroom;Two people to help with walking and/or transfers    Functional Status Assessment  Patient has had a recent decline in their functional status and demonstrates the ability to make significant improvements in function in a reasonable and predictable amount of time.  Equipment Recommendations  Other (comment) (defer)    Recommendations for Other Services       Precautions / Restrictions Precautions Precautions: Fall Restrictions Weight Bearing Restrictions: No      Mobility Bed Mobility Overal bed mobility: Needs Assistance Bed Mobility: Supine to Sit, Sit to Supine     Supine to  sit: Max assist, Total assist, +2 for safety/equipment Sit to supine: Max assist, Total assist, +2 for safety/equipment   General bed mobility comments: multi modal cueing for participation    Transfers                   General transfer comment: unable to tolerate on this date with pt stating "I need to get back to bed" and attempting to return to supine while in seated multiple times      Balance Overall balance assessment: Needs assistance Sitting-balance support: No upper extremity supported, Feet supported Sitting balance-Leahy Scale: Fair                                     ADL either performed or assessed with clinical judgement   ADL Overall ADL's : Needs assistance/impaired     Grooming: Maximal assistance;Cueing for sequencing;Cueing for safety   Upper Body Bathing: Maximal assistance Upper Body Bathing Details (indicate cue type and reason): anticipated Lower Body Bathing: Maximal assistance;Total assistance Lower Body Bathing Details (indicate cue type and reason): anticipated Upper Body Dressing : Maximal assistance Upper Body Dressing Details (indicate cue type and reason): anticipated Lower Body Dressing: Total assistance Lower Body Dressing Details (indicate cue type and reason): socks                     Vision Patient Visual Report: No change from baseline Additional Comments: will continue to assess     Perception     Praxis      Pertinent Vitals/Pain Pain Assessment Pain Assessment: PAINAD Breathing: occasional labored breathing, short period of hyperventilation Negative Vocalization: occasional moan/groan, low  speech, negative/disapproving quality Facial Expression: sad, frightened, frown Body Language: tense, distressed pacing, fidgeting Consolability: distracted or reassured by voice/touch PAINAD Score: 5     Hand Dominance     Extremity/Trunk Assessment Upper Extremity Assessment Upper Extremity  Assessment: Generalized weakness   Lower Extremity Assessment Lower Extremity Assessment: Generalized weakness       Communication Communication Communication: No difficulties   Cognition Arousal/Alertness: Lethargic   Overall Cognitive Status: No family/caregiver present to determine baseline cognitive functioning Area of Impairment: Orientation, Attention, Memory, Following commands, Safety/judgement, Awareness, Problem solving, [place] Recovery Scale                 Orientation Level: Disoriented to, Place, Time, Situation Current Attention Level: Focused Memory: Decreased short-term memory Following Commands: Follows one step commands with increased time, Follows one step commands inconsistently Safety/Judgement: Decreased awareness of deficits Awareness: Intellectual Problem Solving: Slow processing, Difficulty sequencing, Requires verbal cues, Requires tactile cues       General Comments  vitals monitored, appear stable throughout    Exercises     Shoulder Instructions      Home Living Family/patient expects to be discharged to:: Skilled nursing facility                                 Additional Comments: Per [facility], [facility]- per facility max A for bed mobility/transfers; had just started therapy (1 day of tx PTA)      Prior Functioning/Environment Prior Level of Function : Patient poor historian/Family not available             Mobility Comments: Patient unable to provide history; see above for report from facility ADLs Comments: poor historian see above for facility notes        OT Problem List: Decreased strength;Decreased activity tolerance;Impaired balance (sitting and/or standing);Decreased safety awareness;Decreased cognition;Decreased knowledge of use of DME or AE;Decreased knowledge of precautions      OT Treatment/Interventions: Self-care/ADL training;Therapeutic exercise;Balance training;Therapeutic  activities;DME and/or AE instruction;Cognitive remediation/compensation;Patient/family education;Manual therapy    OT Goals(Current goals can be found in the care plan section) Acute Rehab OT Goals OT Goal Formulation: Patient unable to participate in goal setting ADL Goals Pt Will Perform Grooming: with min assist;sitting Pt Will Perform Lower Body Dressing: with mod assist;sitting/lateral leans Pt Will Transfer to Toilet: with mod assist Pt Will Perform Toileting - Clothing Manipulation and hygiene: with mod assist  OT Frequency: Min 2X/week    Co-evaluation PT/OT/SLP Co-Evaluation/Treatment: Yes Reason for Co-Treatment: Complexity of the patient's impairments (multi-system involvement);For patient/therapist safety;To address functional/ADL transfers   OT goals addressed during session: ADL's and self-care      AM-PAC OT "6 Clicks" Daily Activity     Outcome Measure Help from another person eating meals?: A Lot Help from another person taking care of personal grooming?: A Lot Help from another person toileting, which includes using toliet, bedpan, or urinal?: A Lot Help from another person bathing (including washing, rinsing, drying)?: A Lot Help from another person to put on and taking off regular upper body clothing?: A Lot Help from another person to put on and taking off regular lower body clothing?: A Lot 6 Click Score: 12   End of Session Equipment Utilized During Treatment: Rolling walker (2 wheels)  Activity Tolerance: Patient tolerated treatment well Patient left: in bed;with call bell/phone within reach;with bed alarm set  OT Visit Diagnosis: Unsteadiness on feet (  R26.81);Other abnormalities of gait and mobility (R26.89);Muscle weakness (generalized) (M62.81)                Time: [id number] OT Time Calculation (min): 12 min Charges:  OT General Charges $OT Visit: 1 Visit OT Evaluation $OT Eval Low Complexity: 1 Low  Shanon Payor, OTD OTR/L  12/04/22, 10:37 AM

## 2022-12-04 NOTE — Discharge Summary (Addendum)
Physician Discharge Summary   Adriana Martin  female DOB: 12/28/1945  MI:[id number]  PCP: Adriana Lange, NP  Admit date: 11/30/2022 Discharge date: 12/04/2022  Admitted From: LTC SNF Disposition:  LTC SNF CODE STATUS: DNR   Hospital Course:  For full details, please see H&P, progress notes, consult notes and ancillary notes.  Briefly,  Adriana Martin is a 77 y.o. female with medical history significant of hypertension, COPD on 2 L oxygen, hypothyroidism, pacemaker placement, primary biliary cirrhosis, breast cancer (s/p of right mastectomy and radiation therapy), dementia, dCHF, atrial fibrillation on Eliquis, CKD stage IIIb, who presented from LTC SNF with shaking, fever.   Per her daughter at the bedside, patient was recently hospitalized to [facility] at 2/27 due to COVID infection.  Patient was treated with remdesivir and Decadron.  Patient was discharged to the nursing home.  on the morning of presentation, patient was noted to be very shaky in all extremities, with fever and rigors. Her temperature is 102.0 in ED.    SIRS Sepsis ruled out --WBC 20.7, fever 102.0, lactic acid initially > 9.0, which is normalized to 1.5 after giving 2 L IVF bolus. CT-renal stone is negative for kidney stone and no acute finding except for increased stool in rectum.  No obvious signs of PNA.  Pt was started on abx for presumed UTI, however, UA largely benign.  Procal 0.29.  blood cx neg so far.  RVP neg.   --Pt was started on empiric Vanc/cefepime/Flagyl on presentation, and finished 5 days of empiric abx with ceftriaxone and Flagyl.   Leukocytosis --no obvious source of infection, and WBC increased despite being on broad-spectrum abx.  Leukocytosis may be due to steroid use.  Looking at past records, pt often has elevated WBC.  WBC 20.7 on presentation, down to 15.7 prior to discharge.   Severe stool impaction --may be the cause of pt's rectal temp of 102. --s/p enema resulting in  subsequent BM's (blood streaks and mucus could be consequence of prolonged impaction and tissue injuries). --need to have Miralax BID scheduled   Myocardial injury:  Troponin level 91, 100, 79, flat   Acute on chronic diastolic congestive heart failure ([place]): 2D echo on 11/18/2022 showed EF of 65-70%.  Patient has 1+ leg edema, elevated BNP 1044, interstitial pulm edema chest x-ray, clinically consistent with CHF exacerbation.   --s/p IV lasix --resume home lasix 20 mg daily after discharge.  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 --SLP eval, pt improved on 3/13, resumed on dys 1 diet with thin fluid, aspiration precautions; pills Crushed in Puree.  --Ensure supplement if pt is not eating enough of her meals.  Chronic obstructive pulmonary disease (HCC) --stable   Essential hypertension --resume home Lisinopril after discharge.   Atrial fibrillation ([place]):  -Continue Eliquis   Hx of Primary biliary cholangitis [facility]): Patient takes prednisone 5 mg daily chronically. --started on stress dose steroid on admission, since d/c'ed. -Continue ursodiol  --cont home prednisone   HLD (hyperlipidemia) -Pravastatin and Zetia   Breast cancer (HCC) -Tamoxifen   History of stroke -Pravastatin, Zetia and Eliquis   Hypothyroidism -Synthroid   Chronic kidney disease, stage 3a ([place]): Stable --home Advil d/c'ed.   Dementia ([place]) --cont Namenda --not taking donepezil PTA    Discharge Diagnoses:  Principal Problem:   Severe sepsis ([place]) Active  Problems:   UTI (urinary tract infection)   Myocardial injury   Acute on chronic diastolic congestive heart failure (HCC)   Chronic obstructive pulmonary disease (HCC)   Essential hypertension   Atrial fibrillation (HCC)   Primary biliary cholangitis (HCC)   HLD  (hyperlipidemia)   Breast cancer ([place])   History of stroke   Hypothyroidism   Chronic kidney disease, stage 3a ([place])   Dementia ([place])   30 Day Unplanned Readmission Risk Score    Flowsheet Row ED to Hosp-Admission (Current) from 11/30/2022 in [place]  30 Day Unplanned Readmission Risk Score (%) 25.28 Filed at 12/04/2022 0401       This score is the patient's risk of an unplanned readmission within 30 days of being discharged (0 -100%). The score is based on dignosis, age, lab data, medications, orders, and past utilization.   Low:  0-14.9   Medium: 15-21.9   High: 22-29.9   Extreme: 30 and above         Discharge Instructions:  Allergies as of 12/04/2022       Reactions   Atorvastatin Shortness Of Breath   Tolerates pravastatin   Calcium Shortness Of Breath   Fluoxetine    Other reaction(s): Unknown   Hydrochlorothiazide    Other reaction(s): Unknown        Medication List     STOP taking these medications    donepezil 5 MG tablet Commonly known as: ARICEPT   ibuprofen 200 MG tablet Commonly known as: ADVIL   Vitamin D (Ergocalciferol) 1.25 MG (50000 UNIT) Caps capsule Commonly known as: DRISDOL       TAKE these medications    Acetaminophen Extra Strength 500 MG Caps Take 1 Dose by mouth as needed.   Advair Diskus 100-50 MCG/ACT Aepb Generic drug: fluticasone-salmeterol Inhale 1 puff into the lungs 2 (two) times daily.   Eliquis 5 MG Tabs tablet Generic drug: apixaban Take 5 mg by mouth 2 (two) times daily.   ezetimibe 10 MG tablet Commonly known as: ZETIA Take 10 mg by mouth daily.   feeding supplement Liqd Take 237 mLs by mouth 2 (two) times daily between meals.   furosemide 20 MG tablet Commonly known as: LASIX Take 20 mg by mouth daily.   ipratropium-albuterol 0.5-2.5 (3) MG/3ML Soln Commonly known as: DUONEB Take 3 mLs by nebulization 3 (three) times daily.   levothyroxine 50 MCG  tablet Commonly known as: SYNTHROID Take 50 mcg by mouth daily before breakfast.   lisinopril 5 MG tablet Commonly known as: ZESTRIL TAKE (1) TABLET BY MOUTH EVERY DAY TO PROTECT KIDNEYS   melatonin 5 MG Tabs Take 5 mg by mouth at bedtime.   memantine 10 MG tablet Commonly known as: NAMENDA Take 5 mg by mouth 2 (two) times daily.   multivitamin tablet Take 1 tablet by mouth daily.   pantoprazole 40 MG tablet Commonly known as: PROTONIX Take 1 tablet (40 mg total) by mouth 2 (two) times daily.   polyethylene glycol 17 g packet Commonly known as: MIRALAX / GLYCOLAX Take 17 g by mouth 2 (two) times daily. Dissolve 17 grams in 8 ounces of any liquid of pt's choice.   potassium chloride 10 MEQ tablet Commonly known as: KLOR-CON Take 10 mEq by mouth 2 (two) times daily.   pravastatin 40 MG tablet Commonly known as: PRAVACHOL Take 40 mg by mouth at bedtime.   predniSONE 5 MG tablet Commonly known as: DELTASONE Take 5 mg by mouth daily  with breakfast.   tamoxifen 20 MG tablet Commonly known as: NOLVADEX TAKE (1) TABLET BY MOUTH EVERY DAY   ursodiol 300 MG capsule Commonly known as: ACTIGALL Take 2 capsules by mouth 2 (two) times daily.   venlafaxine XR 37.5 MG 24 hr capsule Commonly known as: EFFEXOR-XR Take 75 mg by mouth daily.   Ventolin HFA 108 (90 Base) MCG/ACT inhaler Generic drug: albuterol Inhale 1 puff into the lungs every 4 (four) hours as needed.         Contact information for follow-up providers     Gauger, Victoriano Lain, NP. Go on 12/11/2022.   Specialty: Internal Medicine Why: Go at 10:00am. Contact information: [place] [id number] [phone]              Contact information for after-discharge care     Destination     [facility] Preferred SNF .   Service: Skilled Nursing Contact information: [place] [address] [phone]                      Allergies  Allergen Reactions   Atorvastatin Shortness Of Breath    Tolerates pravastatin   Calcium Shortness Of Breath   Fluoxetine     Other reaction(s): Unknown   Hydrochlorothiazide     Other reaction(s): Unknown     The results of significant diagnostics from this hospitalization (including imaging, microbiology, ancillary and laboratory) are listed below for reference.   Consultations:   Procedures/Studies: CT RENAL STONE STUDY  Result Date: 11/30/2022 CLINICAL DATA:  Abdominal and flank pain suspected kidney stone, history chronic hypoxic respiratory failure, CHF, atrial fibrillation, hypertension, COPD, stroke EXAM: CT ABDOMEN AND PELVIS WITHOUT CONTRAST TECHNIQUE: Multidetector CT imaging of the abdomen and pelvis was performed following the standard protocol without IV contrast. RADIATION DOSE REDUCTION: This exam was performed according to the departmental dose-optimization program which includes automated exposure control, adjustment of the mA and/or kV according to patient size and/or use of iterative reconstruction technique. COMPARISON:  04/04/2021 FINDINGS: Lower chest: Bibasilar effusions and atelectasis. Pacemaker leads RIGHT ventricle and coronary sinus. Post MVR. Hepatobiliary: Gallbladder and liver unremarkable Pancreas: Normal appearance Spleen: Normal appearance, within limitations of respiratory motion. Adrenals/Urinary Tract: Adrenal glands unremarkable. 12 mm RIGHT renal cyst unchanged. No follow-up imaging recommended. No additional renal mass, hydronephrosis, or hydroureter. Bladder unremarkable. Stomach/Bowel: Increased stool in rectum. Minimal sigmoid diverticulosis without evidence of diverticulitis. Stomach and bowel loops otherwise normal appearance. Appendix not visualized. Vascular/Lymphatic: Scattered pelvic phleboliths. Atherosclerotic calcifications aorta and iliac arteries without aneurysm. No adenopathy. Reproductive: Uterus surgically absent.   Unremarkable ovaries Other: No free air or free fluid. No inflammatory process or definite hernia. Musculoskeletal: Osseous demineralization. Chronic compression fractures T8 and L1 unchanged. IMPRESSION: Increased stool in rectum. Minimal sigmoid diverticulosis without evidence of diverticulitis. Bibasilar effusions and atelectasis. No acute intra-abdominal or intrapelvic abnormalities. Aortic Atherosclerosis (ICD10-I70.0). Electronically Signed   By: Lavonia Dana M.D.   On: 11/30/2022 16:21   CT HEAD WO CONTRAST (5MM)  Result Date: 11/30/2022 CLINICAL DATA:  Mental status change, unknown cause EXAM: CT HEAD WITHOUT CONTRAST TECHNIQUE: Contiguous axial images were obtained from the base of the skull through the vertex without intravenous contrast. RADIATION DOSE REDUCTION: This exam was performed according to the departmental dose-optimization program which includes automated exposure control, adjustment of the mA and/or kV according to patient size and/or use of iterative reconstruction technique. COMPARISON:  CT head April 21,  23. FINDINGS: Brain: Similar appearance of a prior left parietal/temporal infarct. Similar small remote right cerebellar infarct. No evidence of acute large vascular territory infarct, acute hemorrhage, mass lesion or midline shift. Patchy white matter hypodensities, compatible with chronic microvascular ischemic disease. Cerebral atrophy. Vascular: No hyperdense vessel identified. Skull: No acute fracture. Sinuses/Orbits: Clear sinuses.  No acute orbital findings. Other: No mastoid effusions. IMPRESSION: Similar appearance of a prior left parietal/temporal infarct. Electronically Signed   By: Margaretha Sheffield M.D.   On: 11/30/2022 16:20   DG Chest Port 1 View  Result Date: 11/30/2022 CLINICAL DATA:  Sepsis EXAM: PORTABLE CHEST 1 VIEW COMPARISON:  08/03/2022 FINDINGS: Cardiac silhouette is enlarged with increased bilateral interstitial opacities favored to represent edema over  pneumonia. Suspect small effusions bilaterally. No pneumothorax. Postop changes from left subclavian 2 lead pacer and cardiac valve. Aorta atherosclerotic. Remote right shoulder arthroplasty noted. Stable right lower lung calcified granuloma. IMPRESSION: Cardiomegaly with diffuse interstitial edema pattern and small effusions. Electronically Signed   By: Jerilynn Mages.  Shick M.D.   On: 11/30/2022 10:44      Labs: BNP (last 3 results) Recent Labs    01/09/22 2025 11/30/22 1230  BNP 150.5* [id number]*   Basic Metabolic Panel: Recent Labs  Lab 11/30/22 0936 12/01/22 1141 12/02/22 0426 12/03/22 0543 12/04/22 0548  NA 137 140 143 140 143  K 3.8 2.5* 3.0* 3.6 3.2*  CL 97* 98 105 106 103  CO2 15* 28 27 26 26   GLUCOSE 161* 155* 100* 139* 118*  BUN 17 21 24* 27* 33*  CREATININE 1.36* 1.13* 1.18* 1.19* 1.20*  CALCIUM 8.7* 8.4* 8.6* 8.6* 9.0  MG 1.9 1.7 1.9 2.1 1.9   Liver Function Tests: Recent Labs  Lab 11/30/22 0936  AST 52*  ALT 22  ALKPHOS 69  BILITOT 0.8  PROT 7.2  ALBUMIN 2.9*   No results for input(s): "LIPASE", "AMYLASE" in the last 168 hours. Recent Labs  Lab 11/30/22 1630  AMMONIA 15   CBC: Recent Labs  Lab 11/30/22 0936 12/01/22 1141 12/02/22 0426 12/03/22 0543 12/04/22 0548  WBC 20.7* 14.0* 22.5* 21.5* 15.7*  NEUTROABS 16.7*  --   --   --   --   HGB 11.5* 11.0* 10.9* 9.9* 10.2*  HCT 37.7 33.3* 34.8* 31.7* 33.2*  MCV 92.0 86.7 89.0 89.5 90.5  PLT 255 261 275 233 204   Cardiac Enzymes: No results for input(s): "CKTOTAL", "CKMB", "CKMBINDEX", "TROPONINI" in the last 168 hours. BNP: Invalid input(s): "POCBNP" CBG: No results for input(s): "GLUCAP" in the last 168 hours. D-Dimer No results for input(s): "DDIMER" in the last 72 hours. Hgb A1c No results for input(s): "HGBA1C" in the last 72 hours. Lipid Profile No results for input(s): "CHOL", "HDL", "LDLCALC", "TRIG", "CHOLHDL", "LDLDIRECT" in the last 72 hours. Thyroid function studies No results for  input(s): "TSH", "T4TOTAL", "T3FREE", "THYROIDAB" in the last 72 hours.  Invalid input(s): "FREET3" Anemia work up No results for input(s): "VITAMINB12", "FOLATE", "FERRITIN", "TIBC", "IRON", "RETICCTPCT" in the last 72 hours. Urinalysis    Component Value Date/Time   COLORURINE YELLOW (A) 11/30/2022 1343   APPEARANCEUR CLOUDY (A) 11/30/2022 1343   APPEARANCEUR Clear 06/09/2013 2122   LABSPEC 1.018 11/30/2022 1343   LABSPEC 1.004 06/09/2013 2122   PHURINE 5.0 11/30/2022 [place] 11/30/2022 1343   GLUCOSEU Negative 06/09/2013 2122   HGBUR NEGATIVE 11/30/2022 [place] NEGATIVE 11/30/2022 1343   BILIRUBINUR Negative 06/09/2013 2122   KETONESUR NEGATIVE 11/30/2022 [place]  100 (A) 11/30/2022 1343   NITRITE NEGATIVE 11/30/2022 1343   LEUKOCYTESUR SMALL (A) 11/30/2022 1343   LEUKOCYTESUR Negative 06/09/2013 2122   Sepsis Labs Recent Labs  Lab 12/01/22 1141 12/02/22 0426 12/03/22 0543 12/04/22 0548  WBC 14.0* 22.5* 21.5* 15.7*   Microbiology Recent Results (from the past 240 hour(s))  Resp panel by RT-PCR (RSV, Flu A&B, Covid) Anterior Nasal Swab     Status: None   Collection Time: 11/30/22  9:36 AM   Specimen: Anterior Nasal Swab  Result Value Ref Range Status   SARS Coronavirus 2 by RT PCR NEGATIVE NEGATIVE Final    Comment: (NOTE) SARS-CoV-2 target nucleic acids are NOT DETECTED.  The SARS-CoV-2 RNA is generally detectable in upper respiratory specimens during the acute phase of infection. The lowest concentration of SARS-CoV-2 viral copies this assay can detect is 138 copies/mL. A negative result does not preclude SARS-Cov-2 infection and should not be used as the sole basis for treatment or other patient management decisions. A negative result may occur with  improper specimen collection/handling, submission of specimen other than nasopharyngeal swab, presence of viral mutation(s) within the areas targeted by this assay, and  inadequate number of viral copies(<138 copies/mL). A negative result must be combined with clinical observations, patient history, and epidemiological information. The expected result is Negative.  Fact Sheet for Patients:  EntrepreneurPulse.com.au  Fact Sheet for Healthcare Providers:  IncredibleEmployment.be  This test is no t yet approved or cleared by the [place] FDA and  has been authorized for detection and/or diagnosis of SARS-CoV-2 by FDA under an Emergency Use Authorization (EUA). This EUA will remain  in effect (meaning this test can be used) for the duration of the COVID-19 declaration under Section 564(b)(1) of the Act, 21 U.S.C.section 360bbb-3(b)(1), unless the authorization is terminated  or revoked sooner.       Influenza A by PCR NEGATIVE NEGATIVE Final   Influenza B by PCR NEGATIVE NEGATIVE Final    Comment: (NOTE) The Xpert Xpress SARS-CoV-2/FLU/RSV plus assay is intended as an aid in the diagnosis of influenza from Nasopharyngeal swab specimens and should not be used as a sole basis for treatment. Nasal washings and aspirates are unacceptable for Xpert Xpress SARS-CoV-2/FLU/RSV testing.  Fact Sheet for Patients: EntrepreneurPulse.com.au  Fact Sheet for Healthcare Providers: IncredibleEmployment.be  This test is not yet approved or cleared by the [place] FDA and has been authorized for detection and/or diagnosis of SARS-CoV-2 by FDA under an Emergency Use Authorization (EUA). This EUA will remain in effect (meaning this test can be used) for the duration of the COVID-19 declaration under Section 564(b)(1) of the Act, 21 U.S.C. section 360bbb-3(b)(1), unless the authorization is terminated or revoked.     Resp Syncytial Virus by PCR NEGATIVE NEGATIVE Final    Comment: (NOTE) Fact Sheet for Patients: EntrepreneurPulse.com.au  Fact Sheet for Healthcare  Providers: IncredibleEmployment.be  This test is not yet approved or cleared by the [place] FDA and has been authorized for detection and/or diagnosis of SARS-CoV-2 by FDA under an Emergency Use Authorization (EUA). This EUA will remain in effect (meaning this test can be used) for the duration of the COVID-19 declaration under Section 564(b)(1) of the Act, 21 U.S.C. section 360bbb-3(b)(1), unless the authorization is terminated or revoked.  Performed at [facility], [address] [place], [place] [address]   Blood Culture (routine x 2)     Status: None (Preliminary result)   Collection Time: 11/30/22 12:24 PM   Specimen: BLOOD  RIGHT HAND  Result Value Ref Range Status   Specimen Description BLOOD RIGHT HAND  Final   Special Requests   Final    BOTTLES DRAWN AEROBIC AND ANAEROBIC Blood Culture adequate volume   Culture   Final    NO GROWTH 4 DAYS Performed at [facility], [address], [place], [place] [address]    Report Status PENDING  Incomplete  Blood Culture (routine x 2)     Status: None (Preliminary result)   Collection Time: 11/30/22 12:30 PM   Specimen: BLOOD RIGHT ARM  Result Value Ref Range Status   Specimen Description BLOOD RIGHT ARM  Final   Special Requests   Final    BOTTLES DRAWN AEROBIC AND ANAEROBIC Blood Culture adequate volume   Culture   Final    NO GROWTH 4 DAYS Performed at [facility], [address] [place], [place] [address]    Report Status PENDING  Incomplete  Respiratory (~20 pathogens) panel by PCR     Status: None   Collection Time: 12/01/22 11:57 AM   Specimen: Nasopharyngeal Swab; Respiratory  Result Value Ref Range Status   Adenovirus NOT DETECTED NOT DETECTED Final   Coronavirus 229E NOT DETECTED NOT DETECTED Final    Comment: (NOTE) The Coronavirus on the Respiratory Panel, DOES NOT test for the novel  Coronavirus (2019 nCoV)    Coronavirus HKU1 NOT DETECTED NOT DETECTED  Final   Coronavirus NL63 NOT DETECTED NOT DETECTED Final   Coronavirus OC43 NOT DETECTED NOT DETECTED Final   Metapneumovirus NOT DETECTED NOT DETECTED Final   Rhinovirus / Enterovirus NOT DETECTED NOT DETECTED Final   Influenza A NOT DETECTED NOT DETECTED Final   Influenza B NOT DETECTED NOT DETECTED Final   Parainfluenza Virus 1 NOT DETECTED NOT DETECTED Final   Parainfluenza Virus 2 NOT DETECTED NOT DETECTED Final   Parainfluenza Virus 3 NOT DETECTED NOT DETECTED Final   Parainfluenza Virus 4 NOT DETECTED NOT DETECTED Final   Respiratory Syncytial Virus NOT DETECTED NOT DETECTED Final   Bordetella pertussis NOT DETECTED NOT DETECTED Final   Bordetella Parapertussis NOT DETECTED NOT DETECTED Final   Chlamydophila pneumoniae NOT DETECTED NOT DETECTED Final   Mycoplasma pneumoniae NOT DETECTED NOT DETECTED Final    Comment: Performed at [facility], [place]. [address] [place], [place] [address]  MRSA Next Gen by PCR, Nasal     Status: None   Collection Time: 12/02/22  3:30 PM   Specimen: Nasal Mucosa; Nasal Swab  Result Value Ref Range Status   MRSA by PCR Next Gen NOT DETECTED NOT DETECTED Final    Comment: (NOTE) The GeneXpert MRSA Assay (FDA approved for NASAL specimens only), is one component of a comprehensive MRSA colonization surveillance program. It is not intended to diagnose MRSA infection nor to guide or monitor treatment for MRSA infections. Test performance is not FDA approved in patients less than 78 years old. Performed at [facility], [address] [place], [place] [address]      Total time spend on discharging this patient, including the last patient exam, discussing the hospital stay, instructions for ongoing care as it relates to all pertinent caregivers, as well as preparing the medical discharge records, prescriptions, and/or referrals as applicable, is 30 minutes.    Enzo Bi, MD  [facility] 12/04/2022, 9:12 AM

## 2022-12-04 NOTE — Evaluation (Signed)
Physical Therapy Evaluation Patient Details Name: Adriana Martin MRN: [id number] DOB: [birth year] Today's Date: 12/04/2022  History of Present Illness  presented to ER secondary to shaking, fever; admitted for management of severe sepsis related to UTI.  Clinical Impression  Patient sleeping upon arrival to room; awakens to light touch and voice, but easily startled to all unexpected, external stimuli. Oriented to self only; inconsistently follows simple commands, often requiring hand-over-hand and therapist demonstration to initiate and complete functional tasks and movements.  Generally weak and deconditioned throughout all extremities; mild increase in weakness throughout R UE (scar over R GH joint and proximal humerus, suspect orthopedic injury/repair).  Currently requiring max/total assist for bed mobility; cga/min assist for unsupported sitting balance and unable to integrate additional functional activities in position.  Tolerates unsupported sitting position only 20-25 seconds prior to spontaneous return to bed (unable to redirect), requiring max assist to reposition.  Unsafe/unable to tolerate additional mobility efforts at this time; recommend use of hoyer lift for any/all OOB efforts as needed. Would benefit from skilled PT to address above deficits and promote optimal return to PLOF.; recommend transition to STR upon discharge from acute hospitalization.        Recommendations for follow up therapy are one component of a multi-disciplinary discharge planning process, led by the attending physician.  Recommendations may be updated based on patient status, additional functional criteria and insurance authorization.  Follow Up Recommendations Skilled nursing-short term rehab (<3 hours/day)      Assistance Recommended at Discharge    Patient can return home with the following  Two people to help with walking and/or transfers;Two people to help with bathing/dressing/bathroom;Help with  stairs or ramp for entrance;Assist for transportation;Direct supervision/assist for financial management;Assistance with cooking/housework;Assistance with feeding;Direct supervision/assist for medications management    Equipment Recommendations    Recommendations for Other Services       Functional Status Assessment Patient has had a recent decline in their functional status and demonstrates the ability to make significant improvements in function in a reasonable and predictable amount of time.     Precautions / Restrictions Precautions Precautions: Fall Restrictions Weight Bearing Restrictions: No      Mobility  Bed Mobility Overal bed mobility: Needs Assistance Bed Mobility: Supine to Sit, Sit to Supine     Supine to sit: Max assist, Total assist Sit to supine: Max assist, Total assist   General bed mobility comments: hand-over-hand to initiate and complete movement; does attempt to assist with truncal elevation, but lacks core strength necessary to complete without extensive assist    Transfers                   General transfer comment: unsafe/unable to tolerate    Ambulation/Gait               General Gait Details: unsafe/unable to tolerate  Stairs            Wheelchair Mobility    Modified Rankin (Stroke Patients Only)       Balance Overall balance assessment: Needs assistance Sitting-balance support: No upper extremity supported, Feet supported Sitting balance-Leahy Scale: Fair Sitting balance - Comments: periods of cga/min assist, but limited tolerance for position/activity       Standing balance comment: unsafe/unable to tolerate                             Pertinent Vitals/Pain Pain Assessment Pain Assessment: PAINAD Breathing: occasional labored  breathing, short period of hyperventilation Negative Vocalization: occasional moan/groan, low speech, negative/disapproving quality Facial Expression: sad, frightened,  frown Body Language: tense, distressed pacing, fidgeting Consolability: distracted or reassured by voice/touch PAINAD Score: 5 Pain Intervention(s): Limited activity within patient's tolerance, Monitored during session, Repositioned    Home Living Family/patient expects to be discharged to:: Skilled nursing facility                   Additional Comments: Was at [facility] prior to admission-per facility, max assist for bed mobility, sit/stand and transfers.  Was able to feed self. Had just started therapy (1 day of services) prior to transfer    Prior Function Prior Level of Function : Patient poor historian/Family not available             Mobility Comments: Patient unable to provide history; see above for report from facility       Hand Dominance        Extremity/Trunk Assessment   Upper Extremity Assessment Upper Extremity Assessment: Generalized weakness (R UE grossly 3-/5, L UE grossly 4-/5)    Lower Extremity Assessment Lower Extremity Assessment:  (grossly 3-/5 throughout)       Communication   Communication: No difficulties (very soft-spoken)  Cognition Arousal/Alertness: Lethargic Behavior During Therapy: Flat affect Overall Cognitive Status: No family/caregiver present to determine baseline cognitive functioning                                 General Comments: Alert and oriented to self only; generally anxious, startles easily to unexpected stimuli.  Limited ability to follow simple commands often requiring hand-over-hand and/or therapist demnstration        General Comments      Exercises     Assessment/Plan    PT Assessment Patient needs continued PT services  PT Problem List Decreased range of motion;Decreased strength;Decreased activity tolerance;Decreased balance;Decreased mobility;Decreased coordination;Decreased cognition;Decreased knowledge of use of DME;Decreased safety awareness;Decreased knowledge of  precautions;Cardiopulmonary status limiting activity       PT Treatment Interventions DME instruction;Gait training;Functional mobility training;Therapeutic activities;Therapeutic exercise;Balance training;Patient/family education;Cognitive remediation    PT Goals (Current goals can be found in the Care Plan section)  Acute Rehab PT Goals PT Goal Formulation: Patient unable to participate in goal setting Time For Goal Achievement: 12/18/22 Potential to Achieve Goals: Fair    Frequency Min 2X/week     Co-evaluation               AM-PAC PT "6 Clicks" Mobility  Outcome Measure Help needed turning from your back to your side while in a flat bed without using bedrails?: Total Help needed moving from lying on your back to sitting on the side of a flat bed without using bedrails?: Total Help needed moving to and from a bed to a chair (including a wheelchair)?: Total Help needed standing up from a chair using your arms (e.g., wheelchair or bedside chair)?: Total Help needed to walk in hospital room?: Total Help needed climbing 3-5 steps with a railing? : Total 6 Click Score: 6    End of Session   Activity Tolerance: Patient limited by fatigue Patient left: in bed;with call bell/phone within reach;with bed alarm set   PT Visit Diagnosis: Muscle weakness (generalized) (M62.81)    Time: [id number] PT Time Calculation (min) (ACUTE ONLY): 20 min   Charges:   PT Evaluation $PT Eval Moderate Complexity: 1 Mod  Zyla Dascenzo H. Owens Shark, PT, DPT, NCS 12/04/22, 10:32 AM [phone]

## 2022-12-04 NOTE — Progress Notes (Signed)
Speech Language Pathology Treatment: Dysphagia  Patient Details Name: Adriana Martin MRN: [id number] DOB: Jun 09, 1946 Today's Date: 12/04/2022 Time: [id number] SLP Time Calculation (min) (ACUTE ONLY): 35 min  Assessment / Plan / Recommendation Clinical Impression  Pt seen for ongoing assessment of swallowing today; trials to upgrade to oral diet hopefully. Pt awake, paucity of speech to direct questions. She exhibits some decreased awareness but more engagement during po tasks today. She responded to verbal/tactile cues. Pt has a baseline of Dementia.  On [place] O2 2L; afebrile.    Pt appears to present w/ oropharyngeal phase dysphagia in setting of declined Cognitive status, possibly exacerbated in setting of acute illness. Pt has a Baseline dx of Dementia. 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  ANY Cognitive decline can impact overall awareness/timing of swallow and safety during po tasks which increases risk for aspiration, choking. Pt is also Edentulous w/ no Bottom Dentition; not wearing Top Denture this session. She required min-mod verbal/visual/tactile cues for follow through during po tasks.    Pt appears at increased risk for aspiration/choking in setting of current oropharyngeal phase deficits/dysphagia exhibited and Baseline comorbidities.     Pt consumed trials of Thin liquids via straw and Purees w/ No overt coughing or decline in respiratory status/vocal quality b/t trials. F/u lingual sweeping movements and a dry swallow were noted intermittently w/ trials given. OF NOTE: pt's oral phase management of boluses was improved this session today -- much less oral prep issues and oral holding of boluses noted; more timely A-P transfer/swallow/clearing. Pt continued to exhibit decreased awareness during po  tasks requiring min verbal cues to redirect attention to po boluses/feeding. Suspect oral phase presentation being impacted by pt's Cognitive decline and acute illness currently. Better follow through w/ oral care today.           In setting of current presentation as above and her risk for aspiration, recommend continue a dysphagia level 1 diet (puree) w/ thin liquids w/ careful monitoring during sips of thin liquids(small, slowly). Recommend full feeding assistance and cues for follow through during po tasks; reduce distractions during meals. 100% Supervision at meals. Recommend general aspiration precautions. Pills in Puree - Crushed as able. This diet consistency will be beneficial for pt's overall safe, oral intake for nutrition. Suspect pt could be at/close to her swallowing baseline in setting of her Dementia. MD/NSG updated.  ST services recommends follow w/ Palliative Care for [facility] [place] and education re: impact of Cognitive decline/Dementia on swallowing. Precautions posted in room. No further acute ST needs at this time. TOC updated also.     HPI HPI: Pt  is a 77 y.o. female with medical history significant of Dementia, hypertension, HLD, COPD on 2 L oxygen, hypothyroidism, depression, L MCA CVA, anxiety, pacemaker placement, primary biliary cirrhosis, breast cancer (s/p of right mastectomy and radiation therapy), dementia, dCHF, atrial fibrillation on Eliquis, CKD stage IIIb, who presents with shaking, fever.     Per her daughter at the bedside, patient was recently hospitalized to [facility] at 2/27 due to COVID infection.  Patient was treated with remdesivir and Decadron.  Patient was discharged to the nursing home.  Since this morning, patient was noted to be very shaky in all extremities, with fever and rigors. Her temperature is 102.0 in ED.  CXR: Cardiomegaly with diffuse interstitial edema pattern and small effusions.  SLP Plan  All goals met      Recommendations for follow up therapy are  one component of a multi-disciplinary discharge planning process, led by the attending physician.  Recommendations may be updated based on patient status, additional functional criteria and insurance authorization.    Recommendations  Diet recommendations: Dysphagia 1 (puree);Thin liquid Liquids provided via: Cup;Straw (monitor) Medication Administration: Crushed with puree Supervision: Staff to assist with self feeding;Full supervision/cueing for compensatory strategies Compensations: Minimize environmental distractions;Slow rate;Small sips/bites;Lingual sweep for clearance of pocketing;Follow solids with liquid;Multiple dry swallows after each bite/sip Postural Changes and/or Swallow Maneuvers: Out of bed for meals;Seated upright 90 degrees;Upright 30-60 min after meal                General recommendations:  (Dietician and Palliative Care f/u(GOC)) Oral Care Recommendations: Oral care BID;Oral care before and after PO;Staff/trained caregiver to provide oral care (denture care) Follow Up Recommendations: Follow physician's recommendations for discharge plan and follow up therapies Assistance recommended at discharge: Frequent or constant Supervision/Assistance SLP Visit Diagnosis: Dysphagia, oropharyngeal phase (R13.12) (baseline Dementia) Plan: All goals met             Orinda Kenner, MS, CCC-SLP Speech Language Pathologist Rehab Services; [place] [phone] (ascom) Kaelan Amble  12/04/2022, 12:29 PM

## 2022-12-04 NOTE — TOC Transition Note (Signed)
Transition of Care [facility]) - CM/SW Discharge Note   Patient Details  Name: Adriana Martin MRN: [id number] Date of Birth: Apr 08, 1946  Transition of Care [facility]) CM/SW Contact:  Candie Chroman, LCSW Phone Number: 12/04/2022, 3:38 PM   Clinical Narrative:  Patient has orders to discharge to [facility] today. RN will call report to [phone] (Room 714). EMS transport has been arranged and she is 2nd on the list. No further concerns. CSW signing off.   Final next level of care: Skilled Nursing Facility Barriers to Discharge: Barriers Resolved   Patient Goals and CMS Choice CMS Medicare.gov Compare Post Acute Care list provided to:: Patient Represenative (must comment) (daughter) Choice offered to / list presented to : Adult Children  Discharge Placement     Existing PASRR number confirmed : 12/02/22          Patient chooses bed at: [facility] [place] Patient to be transferred to facility by: EMS Name of family member notified: [name] and Raynelle Dick Patient and family notified of of transfer: 12/04/22  Discharge Plan and Services Additional resources added to the After Visit Summary for                                       Social Determinants of Health (SDOH) Interventions SDOH Screenings   Food Insecurity: No Food Insecurity (11/30/2022)  Housing: [place]  (11/30/2022)  Transportation Needs: No Transportation Needs (11/30/2022)  Utilities: Not At Risk (11/30/2022)  Depression (PHQ2-9): Low Risk  (10/17/2021)  Tobacco Use: Low Risk  (11/30/2022)     Readmission Risk Interventions    01/14/2022   10:57 AM 04/06/2021   10:09 AM  Readmission Risk Prevention Plan  Transportation Screening Complete Complete  PCP or Specialist Appt within 3-5 Days  Complete  HRI or [place]  Complete  Social Work Consult for [place] Planning/Counseling  Complete  Palliative Care Screening  Complete  Medication Review Press photographer) Complete  Complete  HRI or [place] Complete   SW Recovery Care/Counseling Consult Complete   [place] Not Applicable

## 2022-12-04 NOTE — NC FL2 (Signed)
[place] LEVEL OF CARE FORM     IDENTIFICATION  [name] Birthdate: 11/08/1945 Sex: female Admission Date (Current Location): 11/30/2022  [place] and [place] Number:  Engineering geologist and Address:  [facility], [address], [place], [place] [address]      Provider Number: [id number]  Attending Physician Name and Address:  Enzo Bi, MD  Relative Name and Phone Number:  [name] (daughter)  [phone]    Current Level of Care: Hospital Recommended Level of Care: [place] Prior Approval Number:    Date Approved/Denied:   PASRR Number: [id number]  Discharge Plan: SNF    Current Diagnoses: Patient Active Problem List   Diagnosis Date Noted   Myocardial injury 11/30/2022   Chronic kidney disease, stage 3a ([place]) 11/30/2022   Hypothyroidism 11/30/2022   UTI (urinary tract infection) 11/30/2022   Diarrhea 01/12/2022   Severe sepsis ([place]) 01/10/2022   Laceration of left forearm 01/10/2022   Severe sepsis with acute organ dysfunction ([place]) 01/09/2022   Weakness    Acute respiratory failure with hypoxia [facility])    Cardiac arrest ([place])    Epigastric pain    CHF exacerbation ([place]) 04/19/2021   Colitis 04/05/2021   Acute colitis 04/04/2021   Acute kidney injury superimposed on CKD ([place]) 04/04/2021   HTN (hypertension) 04/04/2021   Atrial fibrillation, chronic ([place]) 04/04/2021   Chronic diastolic CHF (congestive heart failure) ([place]) 04/04/2021   Rectal bleeding 04/04/2021   COPD (chronic obstructive pulmonary disease) ([place]) 04/04/2021   HLD (hyperlipidemia) 04/04/2021   Hypothyroid 04/04/2021   Cirrhosis ([place]) 04/04/2021   Breast cancer ([place]) 04/04/2021   Dementia ([place]) 04/04/2021   History of radiation therapy 04/02/2021   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   Senile purpura ([place]) 10/30/2020   Acquired thrombophilia ([place]) 06/13/2020   Essential hypertension 03/14/2020    Hyperlipidemia 03/14/2020   Acute ischemic stroke ([place]) L MCA s/p mechanical thrombectomy, embolic d/t AF not on [facility] 03/10/2020   History of stroke 03/10/2020   Osteopenia 01/21/2020   Family history of breast cancer    Family history of multiple myeloma    Family history of esophageal cancer    Displaced comminuted fracture of shaft of right humerus 12/05/2018   Recurrent falls 12/05/2018   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/2018   MGUS (monoclonal gammopathy of unknown significance) 12/14/2017   Imbalance 06/24/2017   Tremor 06/24/2017   ICD (implantable cardioverter-defibrillator), biventricular, 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   Carcinoma of overlapping sites of right breast in female, estrogen receptor positive ([place]) 06/16/2016   Acute exacerbation of CHF (congestive heart failure) ([place]) 06/07/2016   A-fib ([place]) 04/08/2016   COPD exacerbation ([place]) 03/22/2016   Acute bronchitis 03/22/2016   Atrial fibrillation with RVR ([place]) 03/22/2016   Chronic renal insufficiency 03/22/2016   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/30/2016   Cough with expectoration 06/04/2015   Essential thrombocytosis ([place]) 02/01/2015   Anemia 02/01/2015   Atrial flutter ([place]) 01/23/2015   Atrial fibrillation ([place]) 01/23/2015   Other long term (current) drug therapy 11/06/2014   High risk medication use 11/06/2014   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   Acute on chronic diastolic congestive heart failure ([place]) 08/01/2014   Severe mitral insufficiency 08/01/2014   Long term current use of anticoagulant 06/06/2014   S/P mitral valve repair 06/06/2014   Anxiety 05/13/2014   Chronic obstructive pulmonary disease ([place]) 03/22/2014   Moderate episode of recurrent major depressive disorder ([place]) 03/22/2014  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 Currently NPO.)  AMBULATORY STATUS COMMUNICATION OF NEEDS Skin   Extensive Assist Verbally Bruising, Other (Comment) (Erythema/redness.)                       Personal Care Assistance Level of Assistance  Bathing, Feeding, Dressing Bathing Assistance: Maximum assistance Feeding assistance: Maximum assistance Dressing Assistance: Maximum assistance     Functional Limitations Info  Sight, Speech, Hearing Sight Info: Adequate Hearing Info: Adequate Speech Info: Adequate    SPECIAL CARE FACTORS FREQUENCY  PT (By licensed PT), OT (By licensed OT)     PT Frequency: 5 x week OT Frequency: 5 x week            Contractures Contractures Info: Not present    Additional Factors Info  Code Status, Allergies Code Status Info: DNR Allergies Info: Atorvastatin, Calcium, Fluoxetine, Hydrochlorothiazide           Current Medications (12/04/2022):  This is the current hospital active medication list Current Facility-Administered Medications  Medication Dose Route Frequency Provider Last Rate Last Admin   acetaminophen (TYLENOL) suppository 650 mg  650 mg Rectal Q6H PRN Ivor Costa, MD       acetaminophen (TYLENOL) tablet 650 mg  650 mg Oral Q6H PRN Ivor Costa, MD   650 mg at 12/02/22 1450   albuterol (PROVENTIL) (2.5 MG/3ML) 0.083% nebulizer solution 2.5 mg  2.5 mg Nebulization Q4H PRN Ivor Costa, MD   2.5 mg at 12/04/22 0811   apixaban (ELIQUIS) tablet 5 mg  5 mg Oral BID Ivor Costa, MD   5 mg at 12/04/22 0837   cefTRIAXone (ROCEPHIN) 2 g in sodium chloride 0.9 % 100 mL IVPB  2 g Intravenous Q24H Ivor Costa, MD 200 mL/hr at 12/04/22 0507 2 g at 12/04/22 0507   dextromethorphan-guaiFENesin  ([place] DM) 30-600 MG per 12 hr tablet 1 tablet  1 tablet Oral BID PRN Ivor Costa, MD       ezetimibe (ZETIA) tablet 10 mg  10 mg Oral Daily Ivor Costa, MD   10 mg at 12/04/22 [id number]   hydrALAZINE (APRESOLINE) injection 5 mg  5 mg Intravenous Q2H PRN Ivor Costa, MD       ipratropium-albuterol (DUONEB) 0.5-2.5 (3) MG/3ML nebulizer solution 3 mL  3 mL Nebulization Q6H PRN Enzo Bi, MD       levothyroxine (SYNTHROID) tablet 50 mcg  50 mcg Oral QAC breakfast Ivor Costa, MD   50 mcg at 12/04/22 0507   melatonin tablet 5 mg  5 mg Oral QHS Ivor Costa, MD   5 mg at 12/03/22 2150   memantine (NAMENDA) tablet 5 mg  5 mg Oral BID Ivor Costa, MD   5 mg at 12/04/22 0837   metroNIDAZOLE (FLAGYL) IVPB 500 mg  500 mg Intravenous Q12H Enzo Bi, MD 100 mL/hr at 12/03/22 2221 500 mg at 12/03/22 2221   mometasone-formoterol (DULERA) 100-5 MCG/ACT inhaler 2 puff  2 puff Inhalation BID Ivor Costa, MD   2 puff at 12/04/22 [id number]   multivitamin with minerals tablet  1 tablet  1 tablet Oral Daily Ivor Costa, MD   1 tablet at 12/04/22 0836   ondansetron (ZOFRAN) injection 4 mg  4 mg Intravenous Q8H PRN Ivor Costa, MD       Oral care mouth rinse  15 mL Mouth Rinse 4 times per day Enzo Bi, MD   15 mL at 12/04/22 0844   Oral care mouth rinse  15 mL Mouth Rinse PRN Enzo Bi, MD       pantoprazole (PROTONIX) injection 40 mg  40 mg Intravenous Q12H Enzo Bi, MD   40 mg at 12/04/22 0838   polyethylene glycol (MIRALAX / GLYCOLAX) packet 17 g  17 g Oral BID Enzo Bi, MD   17 g at 12/04/22 0838   pravastatin (PRAVACHOL) tablet 40 mg  40 mg Oral QHS Ivor Costa, MD   40 mg at 12/03/22 2148   predniSONE (DELTASONE) tablet 5 mg  5 mg Oral Q breakfast Enzo Bi, MD   5 mg at 12/04/22 [id number]   tamoxifen (NOLVADEX) tablet 20 mg  20 mg Oral Daily Ivor Costa, MD   20 mg at 12/04/22 [id number]   ursodiol (ACTIGALL) capsule 600 mg  600 mg Oral BID Ivor Costa, MD   600 mg at 12/04/22 [id number]   venlafaxine XR (EFFEXOR-XR) 24 hr capsule 75 mg  75 mg Oral  Daily Ivor Costa, MD   75 mg at 12/04/22 [id number]     Discharge Medications: Please see discharge summary for a list of discharge medications.  Relevant Imaging Results:  Relevant Lab Results:   Additional Information SS#: [id number]  Candie Chroman, LCSW

## 2022-12-04 NOTE — TOC Progression Note (Addendum)
Transition of Care [facility]) - Progression Note    Patient Details  Name: Adriana Martin MRN: [id number] Date of Birth: 28-Jan-1946  Transition of Care [facility]) CM/SW [place], LCSW Phone Number: 12/04/2022, 9:21 AM  Clinical Narrative:  [facility] admissions coordinator is now stating that patient is there for short-term rehab. Sent secure chat to MD and PT supervisor to notify and requested PT and OT consults for imminent discharge. Will need to get prior authorization.   9:49 am: Updated daughter, [name].  11:14 am: [employer].  12:01 pm: Uploaded additional requested documentation to [facility] portal.  3:23 pm: Auth approved. Auth number has not generated yet. Reference # [id number]. Valid 3/15-3/19. Left voicemail for daughter.  Expected Discharge Plan: Skilled Nursing Facility Barriers to Discharge: Continued Medical Work up  Expected Discharge Plan and [place] arrangements for the past 2 months: [place] Expected Discharge Date: 12/04/22                                     Social Determinants of Health (SDOH) Interventions SDOH Screenings   Food Insecurity: No Food Insecurity (11/30/2022)  Housing: [place]  (11/30/2022)  Transportation Needs: No Transportation Needs (11/30/2022)  Utilities: Not At Risk (11/30/2022)  Depression (PHQ2-9): Low Risk  (10/17/2021)  Tobacco Use: Low Risk  (11/30/2022)    Readmission Risk Interventions    01/14/2022   10:57 AM 04/06/2021   10:09 AM  Readmission Risk Prevention Plan  Transportation Screening Complete Complete  PCP or Specialist Appt within 3-5 Days  Complete  HRI or [place]  Complete  Social Work Consult for [place] Planning/Counseling  Complete  Palliative Care Screening  Complete  Medication Review Press photographer) Complete Complete  HRI or [place] Complete   SW Recovery Care/Counseling Consult Complete    [place] Not Applicable

## 2022-12-04 NOTE — Progress Notes (Signed)
Patient's daughter, [name] notified of transport and discharge back to [facility] by phone call, no further questions at this time. Discharge paperwork and report given to transport staff. All belongings including dentures gathered with patient and transport staff and sent with them. Iv removed with no complications.

## 2022-12-04 NOTE — Plan of Care (Signed)
Patient had pretty good breakfast (50% this morning), but is refusing to eat lunch.  Staff will continue to assist as patient is willing - especially for dinner.

## 2022-12-05 LAB — CULTURE, BLOOD (ROUTINE X 2)
Culture: NO GROWTH
Culture: NO GROWTH
Special Requests: ADEQUATE
Special Requests: ADEQUATE

## 2022-12-07 DIAGNOSIS — J449 Chronic obstructive pulmonary disease, unspecified: Secondary | ICD-10-CM | POA: Diagnosis not present

## 2022-12-07 DIAGNOSIS — J9611 Chronic respiratory failure with hypoxia: Secondary | ICD-10-CM | POA: Diagnosis not present

## 2022-12-07 DIAGNOSIS — K5900 Constipation, unspecified: Secondary | ICD-10-CM | POA: Diagnosis not present

## 2022-12-07 DIAGNOSIS — I5032 Chronic diastolic (congestive) heart failure: Secondary | ICD-10-CM | POA: Diagnosis not present

## 2022-12-09 DIAGNOSIS — J449 Chronic obstructive pulmonary disease, unspecified: Secondary | ICD-10-CM | POA: Diagnosis not present

## 2022-12-09 DIAGNOSIS — D72829 Elevated white blood cell count, unspecified: Secondary | ICD-10-CM | POA: Diagnosis not present

## 2022-12-09 DIAGNOSIS — N183 Chronic kidney disease, stage 3 unspecified: Secondary | ICD-10-CM | POA: Diagnosis not present

## 2022-12-11 ENCOUNTER — Encounter: Payer: Self-pay | Admitting: Family

## 2022-12-11 ENCOUNTER — Ambulatory Visit: Payer: Medicare Other | Attending: Family | Admitting: Family

## 2022-12-11 VITALS — BP 123/69 | HR 66 | Wt 153.0 lb

## 2022-12-11 DIAGNOSIS — R5383 Other fatigue: Secondary | ICD-10-CM | POA: Diagnosis present

## 2022-12-11 DIAGNOSIS — Z803 Family history of malignant neoplasm of breast: Secondary | ICD-10-CM | POA: Diagnosis not present

## 2022-12-11 DIAGNOSIS — I10 Essential (primary) hypertension: Secondary | ICD-10-CM

## 2022-12-11 DIAGNOSIS — C50911 Malignant neoplasm of unspecified site of right female breast: Secondary | ICD-10-CM | POA: Diagnosis not present

## 2022-12-11 DIAGNOSIS — N183 Chronic kidney disease, stage 3 unspecified: Secondary | ICD-10-CM | POA: Insufficient documentation

## 2022-12-11 DIAGNOSIS — I130 Hypertensive heart and chronic kidney disease with heart failure and stage 1 through stage 4 chronic kidney disease, or unspecified chronic kidney disease: Secondary | ICD-10-CM | POA: Insufficient documentation

## 2022-12-11 DIAGNOSIS — D631 Anemia in chronic kidney disease: Secondary | ICD-10-CM | POA: Insufficient documentation

## 2022-12-11 DIAGNOSIS — E785 Hyperlipidemia, unspecified: Secondary | ICD-10-CM | POA: Insufficient documentation

## 2022-12-11 DIAGNOSIS — I4891 Unspecified atrial fibrillation: Secondary | ICD-10-CM | POA: Diagnosis not present

## 2022-12-11 DIAGNOSIS — R531 Weakness: Secondary | ICD-10-CM | POA: Insufficient documentation

## 2022-12-11 DIAGNOSIS — Z79899 Other long term (current) drug therapy: Secondary | ICD-10-CM | POA: Diagnosis not present

## 2022-12-11 DIAGNOSIS — Z8616 Personal history of COVID-19: Secondary | ICD-10-CM | POA: Diagnosis not present

## 2022-12-11 DIAGNOSIS — Z79810 Long term (current) use of selective estrogen receptor modulators (SERMs): Secondary | ICD-10-CM | POA: Diagnosis not present

## 2022-12-11 DIAGNOSIS — R400 Somnolence: Secondary | ICD-10-CM | POA: Diagnosis not present

## 2022-12-11 DIAGNOSIS — I480 Paroxysmal atrial fibrillation: Secondary | ICD-10-CM | POA: Diagnosis not present

## 2022-12-11 DIAGNOSIS — I5032 Chronic diastolic (congestive) heart failure: Secondary | ICD-10-CM | POA: Diagnosis not present

## 2022-12-11 DIAGNOSIS — J449 Chronic obstructive pulmonary disease, unspecified: Secondary | ICD-10-CM | POA: Insufficient documentation

## 2022-12-11 DIAGNOSIS — Z7901 Long term (current) use of anticoagulants: Secondary | ICD-10-CM | POA: Diagnosis not present

## 2022-12-11 DIAGNOSIS — C50919 Malignant neoplasm of unspecified site of unspecified female breast: Secondary | ICD-10-CM

## 2022-12-11 DIAGNOSIS — Z7952 Long term (current) use of systemic steroids: Secondary | ICD-10-CM | POA: Diagnosis not present

## 2022-12-11 NOTE — Patient Instructions (Signed)
Call us in the future if you need us for anything 

## 2022-12-11 NOTE — Progress Notes (Signed)
Patient ID: Adriana Martin, female    DOB: December 18, 1945, 77 y.o.   MRN: [id number]  [name] is a 77 y/o female with a history of breast cancer, hyperlipidemia, HTN, CKD, thyroid disease, anemia, anxiety, alcohol use, atrial fibrillation, dementia, cirrhosis, depression, COPD, monoclonal gammopathy and chronic heart failure.   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 There is mild transvalvular regurgitation of the prosthetic mitral valve. Mean transmitral diastolic gradient: 12 mmHg at a heart rate of 60 bpm. 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 The left atrium is moderately to severely dilated in size. There is mild aortic regurgitation. Echo 04/05/21: EF of 60-65% along with severe LAE along with severe MS.   Admitted 11/30/22 due to fever. 2L IVF given. Empiric antibiotics given. IV lasix given for heart failure exacerbation. Admitted 11/17/22 due to increased weakness, encephalopathy, anorexia and increased SOB, found to be positive for COVID-19+. Given remdesivir and dexamethasone.    She presents today for a HF follow up visit with a chief complaint of moderate fatigue with little exertion. Chronic in nature. Associated cough, SOB, weakness & confusion along with this. Denies dizziness, abdominal distention, palpitations, pedal edema or chest pain.   ROS was done completely with the daughter as the patient slept through the entire visit. Daughter says that PT has started although she has only seen patient sitting up in the chair.   Past Medical History:  Diagnosis Date   Alcohol use    Anemia    Anxiety    Atrial fibrillation ([place])    B12 deficiency    Breast cancer ([place])    RT Lumpectomy c radiation 2012   Breast cancer (HCC)    CHF (congestive heart failure) (HCC)    Chronic diastolic CHF (congestive heart failure) (HCC)    Chronic kidney disease    Cirrhosis (HCC)    CKD  (chronic kidney disease), stage III (HCC)    COPD (chronic obstructive pulmonary disease) (HCC)    Dementia (HCC)    Depression    Family history of breast cancer    Family history of esophageal cancer    Family history of multiple myeloma    GERD (gastroesophageal reflux disease)    Headache    Heart murmur    HLD (hyperlipidemia)    HTN (hypertension)    Hyperlipemia    Hyperlipidemia    Hypertension    Hypothyroid    Hypothyroidism    Iron (Fe) deficiency anemia    Monoclonal gammopathy    Pacemaker    Personal history of radiation therapy    Presence of permanent cardiac pacemaker    Shortness of breath dyspnea    Thrombocytopenia (HCC)    Vitamin D deficiency    Past Surgical History:  Procedure Laterality Date   ABDOMINAL HYSTERECTOMY     Partial   ABLATION     APPENDECTOMY     BICEPT TENODESIS Right 01/07/2018   Procedure: BICEPS TENODESIS;  Surgeon: Leim Fabry, MD;  Location: [facility];  Service: Orthopedics;  Laterality: Right;   BREAST BIOPSY Right 2012   BREAST BIOPSY Right 07/21/2019   venus clip, [place] Bx,positive   BREAST LUMPECTOMY Right 2012   positive/rad   BREAST SURGERY     CARDIAC SURGERY     CARDIAC VALVE REPLACEMENT     COLONOSCOPY  2012   ELECTROPHYSIOLOGIC STUDY N/A 01/23/2015   Procedure: CARDIOVERSION;  Surgeon: Corey Skains, MD;  Location: [facility];  Service: Cardiovascular;  Laterality: N/A;   ELECTROPHYSIOLOGIC STUDY N/A 05/27/2016   Procedure: CARDIOVERSION;  Surgeon: Corey Skains, MD;  Location: [facility];  Service: Cardiovascular;  Laterality: N/A;   ESOPHAGOGASTRODUODENOSCOPY  2012   EYE SURGERY     IR CT HEAD LTD  03/10/2020   IR PERCUTANEOUS ART THROMBECTOMY/INFUSION INTRACRANIAL INC DIAG ANGIO  03/10/2020   IR US GUIDE VASC ACCESS RIGHT  03/10/2020   MASTECTOMY Right    Mastectomy Right    MASTECTOMY WITH AXILLARY LYMPH NODE DISSECTION Right 08/09/2019   Procedure: MASTECTOMY WITH AXILLARY LYMPH NODE DISSECTION;  Surgeon:  Robert Bellow, MD;  Location: [facility];  Service: General;  Laterality: Right;   MITRAL VALVE REPLACEMENT     mvp     PACEMAKER PLACEMENT N/A    RADIOLOGY WITH ANESTHESIA N/A 03/10/2020   Procedure: IR WITH ANESTHESIA;  Surgeon: Radiologist, Medication, MD;  Location: [place];  Service: Radiology;  Laterality: N/A;   REVERSE SHOULDER ARTHROPLASTY Right 01/07/2018   Procedure: REVERSE SHOULDER ARTHROPLASTY;  Surgeon: Leim Fabry, MD;  Location: [facility];  Service: Orthopedics;  Laterality: Right;   VSD REPAIR     VSD REPAIR N/A    Family History  Problem Relation Age of Onset   Multiple myeloma Father    Esophageal cancer Brother    Breast cancer Mother        never treated, dx. >50   Esophageal cancer Brother    Breast cancer Daughter 24   Cancer Daughter 40       rare blood cancer   Social History   Tobacco Use   Smoking status: Never   Smokeless tobacco: Never  Substance Use Topics   Alcohol use: Not Currently   Allergies  Allergen Reactions   Atorvastatin Shortness Of Breath    Tolerates pravastatin   Calcium Shortness Of Breath   Fluoxetine     Other reaction(s): Unknown   Hydrochlorothiazide     Other reaction(s): Unknown   Prior to Admission medications   Medication Sig Start Date End Date Taking? Authorizing Provider  ACETAMINOPHEN EXTRA STRENGTH 500 MG capsule Take 1 Dose by mouth as needed. 08/12/21  Yes [provider]  ADVAIR DISKUS 100-50 MCG/DOSE AEPB Inhale 1 puff into the lungs 2 (two) times daily. 04/15/20  Yes [provider]  ELIQUIS 5 MG TABS tablet Take 5 mg by mouth 2 (two) times daily. 03/05/21  Yes [provider]  ezetimibe (ZETIA) 10 MG tablet Take 10 mg by mouth daily. 02/05/21  Yes [provider]  feeding supplement (ENSURE ENLIVE / ENSURE PLUS) LIQD Take 237 mLs by mouth 2 (two) times daily between meals. 12/04/22  Yes Enzo Bi, MD  furosemide (LASIX) 20 MG tablet Take 20 mg by mouth daily.   Yes [provider]  ipratropium-albuterol (DUONEB) 0.5-2.5 (3) MG/3ML SOLN Take 3 mLs by nebulization 3 (three) times daily.   Yes [provider]  levothyroxine (SYNTHROID) 50 MCG tablet Take 50 mcg by mouth daily before breakfast. 02/05/21  Yes [provider]  lisinopril (ZESTRIL) 5 MG tablet TAKE (1) TABLET BY MOUTH EVERY DAY TO PROTECT KIDNEYS 07/21/21  Yes [provider]  Magnesium 200 MG TABS Take 200 mg by mouth daily at 6 (six) AM.   Yes [provider]  melatonin 5 MG TABS Take 5 mg by mouth at bedtime.   Yes [provider]  memantine (NAMENDA) 10 MG tablet Take 5 mg by mouth  2 (two) times daily.   Yes [provider]  Multiple Vitamin (MULTIVITAMIN) tablet Take 1 tablet by mouth daily.   Yes [provider]  pantoprazole (PROTONIX) 40 MG tablet Take 1 tablet (40 mg total) by mouth 2 (two) times daily. 04/28/21  Yes Nolberto Hanlon, MD  polyethylene glycol (MIRALAX / GLYCOLAX) 17 g packet Take 17 g by mouth 2 (two) times daily. Dissolve 17 grams in 8 ounces of any liquid of pt's choice. 12/04/22  Yes Enzo Bi, MD  potassium chloride (KLOR-CON) 10 MEQ tablet Take 10 mEq by mouth 2 (two) times daily. 11/11/21  Yes [provider]  pravastatin (PRAVACHOL) 40 MG tablet Take 40 mg by mouth at bedtime. 04/02/21  Yes [provider]  predniSONE (DELTASONE) 5 MG tablet Take 5 mg by mouth daily with breakfast.   Yes [provider]  tamoxifen (NOLVADEX) 20 MG tablet TAKE (1) TABLET BY MOUTH EVERY DAY 09/07/22  Yes Verlon Au, NP  ursodiol (ACTIGALL) 300 MG capsule Take 2 capsules by mouth 2 (two) times daily. 01/19/22  Yes [provider]  venlafaxine XR (EFFEXOR-XR) 37.5 MG 24 hr capsule Take 75 mg by mouth daily. 01/14/20  Yes [provider]  VENTOLIN HFA 108 (90 Base) MCG/ACT inhaler Inhale 1 puff into the lungs every 4 (four) hours as needed. 01/31/21   [provider]   Review of  Systems  Constitutional:  Positive for appetite change (decreased) and fatigue.  HENT:  Negative for congestion and sore throat.   Eyes: Negative.   Respiratory:  Positive for cough (loose) and shortness of breath (oxygen @  2 lpm). Negative for chest tightness.   Cardiovascular:  Negative for chest pain, palpitations and leg swelling.  Gastrointestinal:  Negative for abdominal distention and abdominal pain.  Endocrine: Negative.   Genitourinary: Negative.   Musculoskeletal:  Negative for back pain and neck pain.  Skin: Negative.   Allergic/Immunologic: Negative.   Neurological:  Positive for weakness. Negative for dizziness and light-headedness.  Hematological:  Negative for adenopathy. Bruises/bleeds easily.  Psychiatric/Behavioral:  Positive for confusion. Negative for sleep disturbance. The patient is not nervous/anxious.    Vitals:   12/11/22 1440  BP: 123/69  Pulse: 66  SpO2: 95%  Weight: 153 lb (69.4 kg)   Wt Readings from Last 3 Encounters:  12/11/22 153 lb (69.4 kg)  12/04/22 153 lb 14.1 oz (69.8 kg)  08/28/22 169 lb 8 oz (76.9 kg)   Lab Results  Component Value Date   CREATININE 1.20 (H) 12/04/2022   CREATININE 1.19 (H) 12/03/2022   CREATININE 1.18 (H) 12/02/2022   Physical Exam Vitals and nursing note reviewed. Exam conducted with a chaperone present (daughter).  Constitutional:      General: She is not in acute distress.    Appearance: Normal appearance.  HENT:     Head: Normocephalic and atraumatic.  Cardiovascular:     Rate and Rhythm: Normal rate. Rhythm irregular.  Pulmonary:     Effort: Pulmonary effort is normal. No respiratory distress.     Breath sounds: No wheezing, rhonchi or rales.  Abdominal:     General: There is no distension.     Palpations: Abdomen is soft.  Musculoskeletal:        General: No tenderness.     Cervical back: Normal range of motion and neck supple.     Right lower leg: No edema.     Left lower leg: No edema.  Skin:     General:  Skin is warm and dry.  Neurological:     General: No focal deficit present.     Mental Status: She is alert. Mental status is at baseline.  Psychiatric:        Mood and Affect: Mood normal.        Behavior: Behavior normal.   Assessment & Plan:  1: Chronic heart failure with preserved ejection fraction with structural changes (LAE)- - NYHA class III - euvolemic  - order for SNF to weigh patient daily and to call for an overnight weight gain of > 2 pounds or a weekly weight gain of > 5 pounds - unable to weigh patient in office today due to weakness and drowsiness - unable to tolerate jardiance due to UTI's - furosemide 20mg  daily / potassium 67meq BID - lisinopril 5mg  daily - daughter says that PT has started  - BNP 11/30/22 was 1044.5  2: HTN with CKD- - BP 123/69 - saw PCP ([place]) 09/25/22 - BMP 12/04/22 reviewed and showed sodium 143, potassium 3.2, creatinine 1.2, and GFR 47  3: Atrial fibrillation- - saw cardiology Nehemiah Massed) 06/10/22; order written to get f/u cardiology appt scheduled - apixaban 5mg  BID  4: COPD- - on chronic prednisone - 2 lpm continually   5: Right Breast cancer- - saw oncology Janese Banks) 08/28/22 - continues on tamoxifen  Facility med list reviewed.   Due to HF stability, will not make a return appt at this time. Advised daughter that she could call back at anytime for questions or to make another appointment and she was comfortable with this plan.

## 2022-12-17 DIAGNOSIS — R300 Dysuria: Secondary | ICD-10-CM | POA: Diagnosis not present

## 2022-12-17 DIAGNOSIS — R4182 Altered mental status, unspecified: Secondary | ICD-10-CM | POA: Diagnosis not present

## 2022-12-17 DIAGNOSIS — J9611 Chronic respiratory failure with hypoxia: Secondary | ICD-10-CM | POA: Diagnosis not present

## 2022-12-18 ENCOUNTER — Emergency Department: Payer: Medicare Other

## 2022-12-18 ENCOUNTER — Observation Stay: Payer: Medicare Other

## 2022-12-18 ENCOUNTER — Other Ambulatory Visit: Payer: Self-pay

## 2022-12-18 ENCOUNTER — Encounter: Payer: Self-pay | Admitting: Internal Medicine

## 2022-12-18 ENCOUNTER — Inpatient Hospital Stay
Admission: EM | Admit: 2022-12-18 | Discharge: 2022-12-25 | DRG: 689 | Disposition: A | Discharge status: Hospice | Payer: Medicare Other | Source: Skilled Nursing Facility | Attending: Internal Medicine | Admitting: Internal Medicine

## 2022-12-18 DIAGNOSIS — F0390 Unspecified dementia without behavioral disturbance: Secondary | ICD-10-CM | POA: Diagnosis not present

## 2022-12-18 DIAGNOSIS — Z9981 Dependence on supplemental oxygen: Secondary | ICD-10-CM

## 2022-12-18 DIAGNOSIS — J90 Pleural effusion, not elsewhere classified: Secondary | ICD-10-CM | POA: Diagnosis not present

## 2022-12-18 DIAGNOSIS — I4891 Unspecified atrial fibrillation: Secondary | ICD-10-CM | POA: Diagnosis present

## 2022-12-18 DIAGNOSIS — J189 Pneumonia, unspecified organism: Secondary | ICD-10-CM

## 2022-12-18 DIAGNOSIS — R451 Restlessness and agitation: Secondary | ICD-10-CM

## 2022-12-18 DIAGNOSIS — E669 Obesity, unspecified: Secondary | ICD-10-CM | POA: Diagnosis present

## 2022-12-18 DIAGNOSIS — R230 Cyanosis: Secondary | ICD-10-CM | POA: Diagnosis not present

## 2022-12-18 DIAGNOSIS — E785 Hyperlipidemia, unspecified: Secondary | ICD-10-CM | POA: Diagnosis present

## 2022-12-18 DIAGNOSIS — N390 Urinary tract infection, site not specified: Secondary | ICD-10-CM | POA: Diagnosis present

## 2022-12-18 DIAGNOSIS — Z803 Family history of malignant neoplasm of breast: Secondary | ICD-10-CM

## 2022-12-18 DIAGNOSIS — Z7901 Long term (current) use of anticoagulants: Secondary | ICD-10-CM

## 2022-12-18 DIAGNOSIS — N179 Acute kidney failure, unspecified: Secondary | ICD-10-CM | POA: Diagnosis not present

## 2022-12-18 DIAGNOSIS — Z952 Presence of prosthetic heart valve: Secondary | ICD-10-CM

## 2022-12-18 DIAGNOSIS — G9341 Metabolic encephalopathy: Secondary | ICD-10-CM

## 2022-12-18 DIAGNOSIS — Z515 Encounter for palliative care: Secondary | ICD-10-CM | POA: Diagnosis not present

## 2022-12-18 DIAGNOSIS — I5032 Chronic diastolic (congestive) heart failure: Secondary | ICD-10-CM | POA: Diagnosis present

## 2022-12-18 DIAGNOSIS — Z66 Do not resuscitate: Secondary | ICD-10-CM | POA: Diagnosis present

## 2022-12-18 DIAGNOSIS — Z7189 Other specified counseling: Secondary | ICD-10-CM | POA: Diagnosis not present

## 2022-12-18 DIAGNOSIS — Z87440 Personal history of urinary (tract) infections: Secondary | ICD-10-CM

## 2022-12-18 DIAGNOSIS — Z7951 Long term (current) use of inhaled steroids: Secondary | ICD-10-CM

## 2022-12-18 DIAGNOSIS — N3289 Other specified disorders of bladder: Secondary | ICD-10-CM | POA: Diagnosis not present

## 2022-12-18 DIAGNOSIS — Z1612 Extended spectrum beta lactamase (ESBL) resistance: Secondary | ICD-10-CM | POA: Diagnosis present

## 2022-12-18 DIAGNOSIS — I4892 Unspecified atrial flutter: Secondary | ICD-10-CM | POA: Diagnosis present

## 2022-12-18 DIAGNOSIS — J9611 Chronic respiratory failure with hypoxia: Secondary | ICD-10-CM | POA: Diagnosis not present

## 2022-12-18 DIAGNOSIS — B961 Klebsiella pneumoniae [K. pneumoniae] as the cause of diseases classified elsewhere: Secondary | ICD-10-CM | POA: Diagnosis present

## 2022-12-18 DIAGNOSIS — D72829 Elevated white blood cell count, unspecified: Secondary | ICD-10-CM | POA: Diagnosis not present

## 2022-12-18 DIAGNOSIS — J811 Chronic pulmonary edema: Secondary | ICD-10-CM | POA: Diagnosis not present

## 2022-12-18 DIAGNOSIS — K743 Primary biliary cirrhosis: Secondary | ICD-10-CM | POA: Diagnosis present

## 2022-12-18 DIAGNOSIS — R0902 Hypoxemia: Secondary | ICD-10-CM | POA: Diagnosis not present

## 2022-12-18 DIAGNOSIS — A499 Bacterial infection, unspecified: Secondary | ICD-10-CM

## 2022-12-18 DIAGNOSIS — R410 Disorientation, unspecified: Secondary | ICD-10-CM

## 2022-12-18 DIAGNOSIS — I5033 Acute on chronic diastolic (congestive) heart failure: Secondary | ICD-10-CM | POA: Diagnosis not present

## 2022-12-18 DIAGNOSIS — I484 Atypical atrial flutter: Secondary | ICD-10-CM | POA: Diagnosis not present

## 2022-12-18 DIAGNOSIS — D472 Monoclonal gammopathy: Secondary | ICD-10-CM | POA: Diagnosis present

## 2022-12-18 DIAGNOSIS — C50919 Malignant neoplasm of unspecified site of unspecified female breast: Secondary | ICD-10-CM | POA: Diagnosis present

## 2022-12-18 DIAGNOSIS — N3001 Acute cystitis with hematuria: Secondary | ICD-10-CM | POA: Diagnosis not present

## 2022-12-18 DIAGNOSIS — Z807 Family history of other malignant neoplasms of lymphoid, hematopoietic and related tissues: Secondary | ICD-10-CM

## 2022-12-18 DIAGNOSIS — E039 Hypothyroidism, unspecified: Secondary | ICD-10-CM | POA: Diagnosis present

## 2022-12-18 DIAGNOSIS — J810 Acute pulmonary edema: Secondary | ICD-10-CM

## 2022-12-18 DIAGNOSIS — Z888 Allergy status to other drugs, medicaments and biological substances status: Secondary | ICD-10-CM

## 2022-12-18 DIAGNOSIS — J449 Chronic obstructive pulmonary disease, unspecified: Secondary | ICD-10-CM | POA: Diagnosis not present

## 2022-12-18 DIAGNOSIS — N183 Chronic kidney disease, stage 3 unspecified: Secondary | ICD-10-CM | POA: Diagnosis not present

## 2022-12-18 DIAGNOSIS — C50911 Malignant neoplasm of unspecified site of right female breast: Secondary | ICD-10-CM | POA: Diagnosis present

## 2022-12-18 DIAGNOSIS — Z8673 Personal history of transient ischemic attack (TIA), and cerebral infarction without residual deficits: Secondary | ICD-10-CM

## 2022-12-18 DIAGNOSIS — G9340 Encephalopathy, unspecified: Secondary | ICD-10-CM | POA: Diagnosis not present

## 2022-12-18 DIAGNOSIS — Z90710 Acquired absence of both cervix and uterus: Secondary | ICD-10-CM

## 2022-12-18 DIAGNOSIS — N189 Chronic kidney disease, unspecified: Secondary | ICD-10-CM | POA: Diagnosis not present

## 2022-12-18 DIAGNOSIS — R0602 Shortness of breath: Secondary | ICD-10-CM | POA: Diagnosis not present

## 2022-12-18 DIAGNOSIS — Z6828 Body mass index (BMI) 28.0-28.9, adult: Secondary | ICD-10-CM

## 2022-12-18 DIAGNOSIS — N1832 Chronic kidney disease, stage 3b: Secondary | ICD-10-CM | POA: Diagnosis present

## 2022-12-18 DIAGNOSIS — Z79890 Hormone replacement therapy (postmenopausal): Secondary | ICD-10-CM

## 2022-12-18 DIAGNOSIS — I130 Hypertensive heart and chronic kidney disease with heart failure and stage 1 through stage 4 chronic kidney disease, or unspecified chronic kidney disease: Secondary | ICD-10-CM | POA: Diagnosis present

## 2022-12-18 DIAGNOSIS — Z7401 Bed confinement status: Secondary | ICD-10-CM

## 2022-12-18 DIAGNOSIS — Z950 Presence of cardiac pacemaker: Secondary | ICD-10-CM

## 2022-12-18 DIAGNOSIS — I739 Peripheral vascular disease, unspecified: Secondary | ICD-10-CM

## 2022-12-18 DIAGNOSIS — J440 Chronic obstructive pulmonary disease with acute lower respiratory infection: Secondary | ICD-10-CM | POA: Diagnosis present

## 2022-12-18 DIAGNOSIS — Z923 Personal history of irradiation: Secondary | ICD-10-CM

## 2022-12-18 DIAGNOSIS — Z96611 Presence of right artificial shoulder joint: Secondary | ICD-10-CM | POA: Diagnosis present

## 2022-12-18 DIAGNOSIS — R197 Diarrhea, unspecified: Secondary | ICD-10-CM | POA: Diagnosis present

## 2022-12-18 DIAGNOSIS — I10 Essential (primary) hypertension: Secondary | ICD-10-CM | POA: Diagnosis not present

## 2022-12-18 DIAGNOSIS — Z79810 Long term (current) use of selective estrogen receptor modulators (SERMs): Secondary | ICD-10-CM

## 2022-12-18 DIAGNOSIS — Z800 Family history of malignant neoplasm of digestive organs: Secondary | ICD-10-CM

## 2022-12-18 DIAGNOSIS — R6520 Severe sepsis without septic shock: Secondary | ICD-10-CM | POA: Diagnosis not present

## 2022-12-18 DIAGNOSIS — R413 Other amnesia: Secondary | ICD-10-CM | POA: Diagnosis not present

## 2022-12-18 DIAGNOSIS — R4182 Altered mental status, unspecified: Secondary | ICD-10-CM | POA: Diagnosis not present

## 2022-12-18 DIAGNOSIS — I483 Typical atrial flutter: Secondary | ICD-10-CM

## 2022-12-18 DIAGNOSIS — Z79899 Other long term (current) drug therapy: Secondary | ICD-10-CM

## 2022-12-18 DIAGNOSIS — I872 Venous insufficiency (chronic) (peripheral): Secondary | ICD-10-CM | POA: Diagnosis present

## 2022-12-18 DIAGNOSIS — R9431 Abnormal electrocardiogram [ECG] [EKG]: Secondary | ICD-10-CM | POA: Diagnosis not present

## 2022-12-18 DIAGNOSIS — Z9011 Acquired absence of right breast and nipple: Secondary | ICD-10-CM

## 2022-12-18 DIAGNOSIS — Z853 Personal history of malignant neoplasm of breast: Secondary | ICD-10-CM

## 2022-12-18 LAB — COMPREHENSIVE METABOLIC PANEL
ALT: 125 U/L — ABNORMAL HIGH (ref 0–44)
AST: 109 U/L — ABNORMAL HIGH (ref 15–41)
Albumin: 2.7 g/dL — ABNORMAL LOW (ref 3.5–5.0)
Alkaline Phosphatase: 68 U/L (ref 38–126)
Anion gap: 16 — ABNORMAL HIGH (ref 5–15)
BUN: 49 mg/dL — ABNORMAL HIGH (ref 8–23)
CO2: 22 mmol/L (ref 22–32)
Calcium: 8.6 mg/dL — ABNORMAL LOW (ref 8.9–10.3)
Chloride: 104 mmol/L (ref 98–111)
Creatinine, Ser: 2.38 mg/dL — ABNORMAL HIGH (ref 0.44–1.00)
GFR, Estimated: 20 mL/min — ABNORMAL LOW (ref >60)
Glucose, Bld: 184 mg/dL — ABNORMAL HIGH (ref 70–99)
Potassium: 4.3 mmol/L (ref 3.5–5.1)
Sodium: 142 mmol/L (ref 135–145)
Total Bilirubin: 1.2 mg/dL (ref 0.3–1.2)
Total Protein: 6.4 g/dL — ABNORMAL LOW (ref 6.5–8.1)

## 2022-12-18 LAB — URINALYSIS, ROUTINE W REFLEX MICROSCOPIC
Bilirubin Urine: NEGATIVE
Glucose, UA: NEGATIVE mg/dL
Ketones, ur: NEGATIVE mg/dL
Nitrite: NEGATIVE
Protein, ur: 30 mg/dL — AB
Specific Gravity, Urine: 1.019 (ref 1.005–1.030)
WBC, UA: >50 WBC/hpf (ref 0–5)
pH: 5 (ref 5.0–8.0)

## 2022-12-18 LAB — CBC WITH DIFFERENTIAL/PLATELET
Abs Immature Granulocytes: 0.09 10*3/uL — ABNORMAL HIGH (ref 0.00–0.07)
Basophils Absolute: 0.1 10*3/uL (ref 0.0–0.1)
Basophils Relative: 0 %
Eosinophils Absolute: 0.1 10*3/uL (ref 0.0–0.5)
Eosinophils Relative: 1 %
HCT: 38.7 % (ref 36.0–46.0)
Hemoglobin: 11.1 g/dL — ABNORMAL LOW (ref 12.0–15.0)
Immature Granulocytes: 1 %
Lymphocytes Relative: 2 %
Lymphs Abs: 0.3 10*3/uL — ABNORMAL LOW (ref 0.7–4.0)
MCH: 26.9 pg (ref 26.0–34.0)
MCHC: 28.7 g/dL — ABNORMAL LOW (ref 30.0–36.0)
MCV: 93.7 fL (ref 80.0–100.0)
Monocytes Absolute: 0.6 10*3/uL (ref 0.1–1.0)
Monocytes Relative: 4 %
Neutro Abs: 13.2 10*3/uL — ABNORMAL HIGH (ref 1.7–7.7)
Neutrophils Relative %: 92 %
Platelets: 178 10*3/uL (ref 150–400)
RBC: 4.13 MIL/uL (ref 3.87–5.11)
RDW: 17.2 % — ABNORMAL HIGH (ref 11.5–15.5)
WBC: 14.4 10*3/uL — ABNORMAL HIGH (ref 4.0–10.5)
nRBC: 1.3 % — ABNORMAL HIGH (ref 0.0–0.2)

## 2022-12-18 LAB — TROPONIN I (HIGH SENSITIVITY)
Troponin I (High Sensitivity): 80 ng/L — ABNORMAL HIGH (ref <18)
Troponin I (High Sensitivity): 66 ng/L — ABNORMAL HIGH (ref <18)

## 2022-12-18 LAB — PROTIME-INR
INR: 2.3 — ABNORMAL HIGH (ref 0.8–1.2)
Prothrombin Time: 25.4 seconds — ABNORMAL HIGH (ref 11.4–15.2)

## 2022-12-18 LAB — HEPARIN LEVEL (UNFRACTIONATED): Heparin Unfractionated: >1.1 IU/mL — ABNORMAL HIGH (ref 0.30–0.70)

## 2022-12-18 LAB — BASIC METABOLIC PANEL
Anion gap: 16 — ABNORMAL HIGH (ref 5–15)
BUN: 47 mg/dL — ABNORMAL HIGH (ref 8–23)
CO2: 24 mmol/L (ref 22–32)
Calcium: 8.9 mg/dL (ref 8.9–10.3)
Chloride: 104 mmol/L (ref 98–111)
Creatinine, Ser: 2.2 mg/dL — ABNORMAL HIGH (ref 0.44–1.00)
GFR, Estimated: 23 mL/min — ABNORMAL LOW (ref >60)
Glucose, Bld: 128 mg/dL — ABNORMAL HIGH (ref 70–99)
Potassium: 4.8 mmol/L (ref 3.5–5.1)
Sodium: 144 mmol/L (ref 135–145)

## 2022-12-18 LAB — LACTIC ACID, PLASMA: Lactic Acid, Venous: 2.3 mmol/L (ref 0.5–1.9)

## 2022-12-18 LAB — BRAIN NATRIURETIC PEPTIDE: B Natriuretic Peptide: 926.7 pg/mL — ABNORMAL HIGH (ref 0.0–100.0)

## 2022-12-18 LAB — APTT: aPTT: 38 seconds — ABNORMAL HIGH (ref 24–36)

## 2022-12-18 MED ORDER — ACETAMINOPHEN 325 MG PO TABS: 650.0000 mg | ORAL_TABLET | Freq: Four times a day (QID) | ORAL | Status: DC | PRN

## 2022-12-18 MED ORDER — HEPARIN (PORCINE) 25000 UT/250ML-% IV SOLN
900.0000 [IU]/h | INTRAVENOUS | Status: DC
  Administered 2022-12-18: 800 [IU]/h via INTRAVENOUS
  Filled 2022-12-18: qty 250

## 2022-12-18 MED ORDER — SODIUM CHLORIDE 0.9% FLUSH
3.0000 mL | Freq: Two times a day (BID) | INTRAVENOUS | Status: DC
  Administered 2022-12-18 – 2022-12-22 (×4): 3 mL via INTRAVENOUS

## 2022-12-18 MED ORDER — SODIUM CHLORIDE 0.9 % IV SOLN
1.0000 g | INTRAVENOUS | Status: DC
  Administered 2022-12-18: 1 g via INTRAVENOUS
  Filled 2022-12-18 (×3): qty 10

## 2022-12-18 MED ORDER — LEVOTHYROXINE SODIUM 50 MCG PO TABS
50.0000 ug | ORAL_TABLET | Freq: Every day | ORAL | Status: DC
  Administered 2022-12-19 – 2022-12-24 (×6): 50 ug via ORAL
  Filled 2022-12-18 (×6): qty 1

## 2022-12-18 MED ORDER — FUROSEMIDE 10 MG/ML IJ SOLN
40.0000 mg | Freq: Once | INTRAMUSCULAR | Status: AC
  Administered 2022-12-18: 40 mg via INTRAVENOUS
  Filled 2022-12-18: qty 4

## 2022-12-18 MED ORDER — URSODIOL 300 MG PO CAPS
600.0000 mg | ORAL_CAPSULE | Freq: Two times a day (BID) | ORAL | Status: DC
  Administered 2022-12-18 – 2022-12-23 (×11): 600 mg via ORAL
  Filled 2022-12-18 (×15): qty 2

## 2022-12-18 MED ORDER — ONDANSETRON HCL 4 MG PO TABS: 4.0000 mg | ORAL_TABLET | Freq: Four times a day (QID) | ORAL | Status: DC | PRN

## 2022-12-18 MED ORDER — VENLAFAXINE HCL ER 75 MG PO CP24
75.0000 mg | ORAL_CAPSULE | Freq: Every day | ORAL | Status: DC
  Administered 2022-12-19 – 2022-12-23 (×5): 75 mg via ORAL
  Filled 2022-12-18 (×6): qty 1

## 2022-12-18 MED ORDER — HEPARIN BOLUS VIA INFUSION
4000.0000 [IU] | Freq: Once | INTRAVENOUS | Status: AC
  Administered 2022-12-18: 4000 [IU] via INTRAVENOUS
  Filled 2022-12-18: qty 4000

## 2022-12-18 MED ORDER — TAMOXIFEN CITRATE 10 MG PO TABS
20.0000 mg | ORAL_TABLET | Freq: Every day | ORAL | Status: DC
  Administered 2022-12-19 – 2022-12-23 (×5): 20 mg via ORAL
  Filled 2022-12-18 (×5): qty 2

## 2022-12-18 MED ORDER — IPRATROPIUM-ALBUTEROL 0.5-2.5 (3) MG/3ML IN SOLN
3.0000 mL | Freq: Three times a day (TID) | RESPIRATORY_TRACT | Status: DC
  Administered 2022-12-19 – 2022-12-24 (×15): 3 mL via RESPIRATORY_TRACT
  Filled 2022-12-18 (×14): qty 3

## 2022-12-18 MED ORDER — MEMANTINE HCL 5 MG PO TABS
5.0000 mg | ORAL_TABLET | Freq: Two times a day (BID) | ORAL | Status: DC
  Administered 2022-12-18 – 2022-12-23 (×11): 5 mg via ORAL
  Filled 2022-12-18 (×12): qty 1

## 2022-12-18 MED ORDER — PREDNISONE 10 MG PO TABS
5.0000 mg | ORAL_TABLET | Freq: Every day | ORAL | Status: DC
  Administered 2022-12-19 – 2022-12-23 (×5): 5 mg via ORAL
  Filled 2022-12-18 (×6): qty 1

## 2022-12-18 MED ORDER — POLYETHYLENE GLYCOL 3350 17 G PO PACK: 17.0000 g | PACK | Freq: Every day | ORAL | Status: DC | PRN

## 2022-12-18 MED ORDER — PRAVASTATIN SODIUM 20 MG PO TABS
40.0000 mg | ORAL_TABLET | Freq: Every day | ORAL | Status: DC
  Administered 2022-12-18 – 2022-12-22 (×5): 40 mg via ORAL
  Filled 2022-12-18 (×5): qty 2

## 2022-12-18 MED ORDER — ONDANSETRON HCL 4 MG/2ML IJ SOLN: 4.0000 mg | Freq: Four times a day (QID) | INTRAMUSCULAR | Status: DC | PRN

## 2022-12-18 MED ORDER — ACETAMINOPHEN 650 MG RE SUPP: 650.0000 mg | Freq: Four times a day (QID) | RECTAL | Status: DC | PRN

## 2022-12-18 MED ORDER — PANTOPRAZOLE SODIUM 40 MG PO TBEC
40.0000 mg | DELAYED_RELEASE_TABLET | Freq: Two times a day (BID) | ORAL | Status: DC
  Administered 2022-12-18 – 2022-12-23 (×11): 40 mg via ORAL
  Filled 2022-12-18 (×12): qty 1

## 2022-12-18 MED ORDER — EZETIMIBE 10 MG PO TABS
10.0000 mg | ORAL_TABLET | Freq: Every day | ORAL | Status: DC
  Administered 2022-12-19 – 2022-12-23 (×5): 10 mg via ORAL
  Filled 2022-12-18 (×5): qty 1

## 2022-12-18 MED ORDER — GUAIFENESIN-DM 100-10 MG/5ML PO SYRP
5.0000 mL | ORAL_SOLUTION | ORAL | Status: DC | PRN
  Administered 2022-12-18 – 2022-12-23 (×6): 5 mL via ORAL
  Filled 2022-12-18 (×9): qty 10

## 2022-12-18 MED ORDER — FLUTICASONE FUROATE-VILANTEROL 100-25 MCG/ACT IN AEPB
1.0000 | INHALATION_SPRAY | Freq: Every day | RESPIRATORY_TRACT | Status: DC
  Administered 2022-12-19 – 2022-12-23 (×3): 1 via RESPIRATORY_TRACT
  Filled 2022-12-18 (×2): qty 28

## 2022-12-18 NOTE — Assessment & Plan Note (Addendum)
-   Delirium precautions - Continue home Memantine

## 2022-12-18 NOTE — ED Provider Notes (Signed)
[facility] Provider Note    Event Date/Time   First MD Initiated Contact with Patient 12/18/22 [phone]     (approximate)   History   Shortness of Breath   HPI  Adriana Martin is a 77 y.o. female  hypertension, COPD on 2 L oxygen, hypothyroidism, pacemaker placement, primary biliary cirrhosis, breast cancer (s/p of right mastectomy and radiation therapy), dementia, dCHF, atrial fibrillation on Eliquis, CKD stage IIIb who comes in with concerns for shortness of breath and increasing confusion.  Report from EMS was that patient was having some shortness of breath and not acting her normal self and get a urine that was concerning for UTI as well and has had a chest x-ray concerning for fluid on the lungs as well as worsening kidney function therefore sent patient here for evaluation.  I called the facility to get some more collateral information of what brought them in Increased agitation, confusion they did a Urine analysis and there were some wbc -- white count 16.4 -- cr 2.6 chest xray was concerning for CHF. They didn't want to do lasix due to her kidney function. No falls they are aware of- is on eliquis there. She is bed bound.    Physical Exam   Triage Vital Signs: ED Triage Vitals  Enc Vitals Group     BP      Pulse      Resp      Temp      Temp src      SpO2      Weight      Height      Head Circumference      [facility]      Pain Score      Pain Loc      Pain Edu?      Excl. in [place]?     Most recent vital signs: There were no vitals filed for this visit.   General: Patient is altered.  Oriented x 0 CV:  Good peripheral perfusion.  Resp:  Normal effort.  Abd:  No distention.  Soft and nontender Other:  Squeezes bilateral hands moves toes a little bit   ED Results / Procedures / Treatments   Labs (all labs ordered are listed, but only abnormal results are displayed) Labs Reviewed  CBC WITH DIFFERENTIAL/PLATELET - Abnormal;  Notable for the following components:      Result Value   WBC 14.4 (*)    Hemoglobin 11.1 (*)    MCHC 28.7 (*)    RDW 17.2 (*)    nRBC 1.3 (*)    Neutro Abs 13.2 (*)    Lymphs Abs 0.3 (*)    Abs Immature Granulocytes 0.09 (*)    All other components within normal limits  COMPREHENSIVE METABOLIC PANEL - Abnormal; Notable for the following components:   Glucose, Bld 184 (*)    BUN 49 (*)    Creatinine, Ser 2.38 (*)    Calcium 8.6 (*)    Total Protein 6.4 (*)    Albumin 2.7 (*)    AST 109 (*)    ALT 125 (*)    GFR, Estimated 20 (*)    Anion gap 16 (*)    All other components within normal limits  TROPONIN I (HIGH SENSITIVITY) - Abnormal; Notable for the following components:   Troponin I (High Sensitivity) 80 (*)    All other components within normal limits  URINE CULTURE  BRAIN NATRIURETIC PEPTIDE  URINALYSIS, ROUTINE  W REFLEX MICROSCOPIC  TROPONIN I (HIGH SENSITIVITY)     EKG  My interpretation of EKG:  Sinus rate of 71 without any ST elevation or T wave inversions except for V2, right bundle branch block and left anterior fascicular block  RADIOLOGY I have reviewed the xray personally and interpreted patient has enlarged heart, mild edema, pacemaker  PROCEDURES:  Critical Care performed: No  .1-3 Lead EKG Interpretation  Performed by: Vanessa Putnam, MD Authorized by: Vanessa Roosevelt, MD     Interpretation: normal     ECG rate:  60   ECG rate assessment: normal     Rhythm: sinus rhythm     Ectopy: none     Conduction: normal      MEDICATIONS ORDERED IN ED: Medications - No data to display   IMPRESSION / MDM / [place] / ED COURSE  I reviewed the triage vital signs and the nursing notes.   Patient's presentation is most consistent with acute presentation with potential threat to life or bodily function.   Patient comes in with concern for increasing agitation, confusion.  Blood work was ordered to evaluate for AKI, UTI, dehydration, ACS,  chest x-ray to evaluate for any edema.  CBC shows elevated white count but is downtrending from her priors.  Hemoglobin stable.  CMP does show elevated creatinine of 2.38 troponin is slightly elevated but similar to her priors most likely demand.   They had difficulty getting straight cath will get a bladder scan to make sure no significant retention.  Will discuss hospital team for admission for AKI, confusion probable UTI  I did review the records the patient was discharged on 2 L of oxygen so that she came on no oxygen she was hypoxic here and placed back onto her baseline oxygen of 2 L  Family is at bedside updated them.  Will discuss for admission given AKI, CHF urine still pending and handed off to the hospitalist pending this result  The patient is on the cardiac monitor to evaluate for evidence of arrhythmia and/or significant heart rate changes.      FINAL CLINICAL IMPRESSION(S) / ED DIAGNOSES   Final diagnoses:  AKI (acute kidney injury) ([place])  Confusion  Acute pulmonary edema ([place])  Agitation     Rx / DC Orders   ED Discharge Orders     None        Note:  This document was prepared using Dragon voice recognition software and may include unintentional dictation errors.   Vanessa [place], MD 12/18/22 [phone]

## 2022-12-18 NOTE — Assessment & Plan Note (Signed)
Patient is presenting with increased disorientation and agitation with an underlying history of dementia. Family said she has gradually declined over the last 1 month but very much declined since her last admission 2 weeks ago.  Differential for etiology at this point is broad but includes infectious etiology such as UTI, versus AKI due to poor p.o. intake.  Unclear if her vascular disease could be contributing.  - N.p.o. until improvement in mental status - SLP evaluation - Management of UTI and AKI as noted below

## 2022-12-18 NOTE — Consult Note (Signed)
Reason for Consult: Discolored toes bilaterally, diminished DP pulses Referring Physician: Dr. Parke Simmers Adriana Martin is an 77 y.o. female.  HPI: This is 77 [facility] with concerns of elevated white count, UTI, possible low-grade pneumonia.  She has a history of diminished cardiac function, CKD, and traditional vascular risk factors of dyslipidemia, diabetes mellitus, hypertension.  She is not a current smoker.  She has some dementia making the history difficult to obtain.  She has a history of cirrhosis as well, was on Eliquis for atrial fibrillation.  She is relatively bedbound according to the chart.  No overt tissue loss currently but discoloration of the feet is noted with diminished DPs.  Concern for vascular disease was raised.  Past Medical History:  Diagnosis Date   Alcohol use    Anemia    Anxiety    Atrial fibrillation ([place])    B12 deficiency    Breast cancer ([place])    RT Lumpectomy c radiation 2012   Breast cancer (HCC)    CHF (congestive heart failure) (HCC)    Chronic diastolic CHF (congestive heart failure) (HCC)    Chronic kidney disease    Cirrhosis (HCC)    CKD (chronic kidney disease), stage III (HCC)    COPD (chronic obstructive pulmonary disease) (HCC)    Dementia (HCC)    Depression    Family history of breast cancer    Family history of esophageal cancer    Family history of multiple myeloma    GERD (gastroesophageal reflux disease)    Headache    Heart murmur    HLD (hyperlipidemia)    HTN (hypertension)    Hyperlipemia    Hyperlipidemia    Hypertension    Hypothyroid    Hypothyroidism    Iron (Fe) deficiency anemia    Monoclonal gammopathy    Pacemaker    Personal history of radiation therapy    Presence of permanent cardiac pacemaker    Shortness of breath dyspnea    Thrombocytopenia (HCC)    Vitamin D deficiency     Past Surgical History:  Procedure Laterality Date   ABDOMINAL HYSTERECTOMY      Partial   ABLATION     APPENDECTOMY     BICEPT TENODESIS Right 01/07/2018   Procedure: BICEPS TENODESIS;  Surgeon: Leim Fabry, MD;  Location: [facility];  Service: Orthopedics;  Laterality: Right;   BREAST BIOPSY Right 2012   BREAST BIOPSY Right 07/21/2019   venus clip, [place] Bx,positive   BREAST LUMPECTOMY Right 2012   positive/rad   BREAST SURGERY     CARDIAC SURGERY     CARDIAC VALVE REPLACEMENT     COLONOSCOPY  2012   ELECTROPHYSIOLOGIC STUDY N/A 01/23/2015   Procedure: CARDIOVERSION;  Surgeon: Corey Skains, MD;  Location: [facility];  Service: Cardiovascular;  Laterality: N/A;   ELECTROPHYSIOLOGIC STUDY N/A 05/27/2016   Procedure: CARDIOVERSION;  Surgeon: Corey Skains, MD;  Location: [facility];  Service: Cardiovascular;  Laterality: N/A;   ESOPHAGOGASTRODUODENOSCOPY  2012   EYE SURGERY     IR CT HEAD LTD  03/10/2020   IR PERCUTANEOUS ART THROMBECTOMY/INFUSION INTRACRANIAL INC DIAG ANGIO  03/10/2020   IR US GUIDE VASC ACCESS RIGHT  03/10/2020   MASTECTOMY Right    Mastectomy Right    MASTECTOMY WITH AXILLARY LYMPH NODE DISSECTION Right 08/09/2019   Procedure: MASTECTOMY WITH AXILLARY LYMPH NODE DISSECTION;  Surgeon: Robert Bellow, MD;  Location: [facility];  Service: General;  Laterality: Right;  MITRAL VALVE REPLACEMENT     mvp     PACEMAKER PLACEMENT N/A    RADIOLOGY WITH ANESTHESIA N/A 03/10/2020   Procedure: IR WITH ANESTHESIA;  Surgeon: Radiologist, Medication, MD;  Location: [facility];  Service: Radiology;  Laterality: N/A;   REVERSE SHOULDER ARTHROPLASTY Right 01/07/2018   Procedure: REVERSE SHOULDER ARTHROPLASTY;  Surgeon: Leim Fabry, MD;  Location: [facility];  Service: Orthopedics;  Laterality: Right;   VSD REPAIR     VSD REPAIR N/A     Family History  Problem Relation Age of Onset   Multiple myeloma Father    Esophageal cancer Brother    Breast cancer Mother        never treated, dx. >50   Esophageal cancer Brother    Breast cancer Daughter 46   Cancer Daughter 11        rare blood cancer    Social History:  reports that she has never smoked. She has never used smokeless tobacco. She reports that she does not currently use alcohol. She reports that she does not use drugs.  Allergies:  Allergies  Allergen Reactions   Atorvastatin Shortness Of Breath    Tolerates pravastatin   Calcium Shortness Of Breath   Fluoxetine     Other reaction(s): Unknown   Hydrochlorothiazide     Other reaction(s): Unknown    Medications: I have reviewed the patient's current medications.  Results for orders placed or performed during the hospital encounter of 12/18/22 (from the past 48 hour(s))  CBC with Differential     Status: Abnormal   Collection Time: 12/18/22 10:38 AM  Result Value Ref Range   WBC 14.4 (H) 4.0 - 10.5 K/uL   RBC 4.13 3.87 - 5.11 MIL/uL   Hemoglobin 11.1 (L) 12.0 - 15.0 g/dL   HCT 38.7 36.0 - 46.0 %   MCV 93.7 80.0 - 100.0 fL   MCH 26.9 26.0 - 34.0 pg   MCHC 28.7 (L) 30.0 - 36.0 g/dL   RDW 17.2 (H) 11.5 - 15.5 %   Platelets 178 150 - 400 K/uL   nRBC 1.3 (H) 0.0 - 0.2 %   Neutrophils Relative % 92 %   Neutro Abs 13.2 (H) 1.7 - 7.7 K/uL   Lymphocytes Relative 2 %   Lymphs Abs 0.3 (L) 0.7 - 4.0 K/uL   Monocytes Relative 4 %   Monocytes Absolute 0.6 0.1 - 1.0 K/uL   Eosinophils Relative 1 %   Eosinophils Absolute 0.1 0.0 - 0.5 K/uL   Basophils Relative 0 %   Basophils Absolute 0.1 0.0 - 0.1 K/uL   Immature Granulocytes 1 %   Abs Immature Granulocytes 0.09 (H) 0.00 - 0.07 K/uL    Comment: Performed at [facility], [address] [place], [place] [address]  Comprehensive metabolic panel     Status: Abnormal   Collection Time: 12/18/22 10:38 AM  Result Value Ref Range   Sodium 142 135 - 145 mmol/L   Potassium 4.3 3.5 - 5.1 mmol/L   Chloride 104 98 - 111 mmol/L   CO2 22 22 - 32 mmol/L   Glucose, Bld 184 (H) 70 - 99 mg/dL    Comment: Glucose reference range applies only to samples taken after fasting for at least 8 hours.    BUN 49 (H) 8 - 23 mg/dL   Creatinine, Ser 2.38 (H) 0.44 - 1.00 mg/dL   Calcium 8.6 (L) 8.9 - 10.3 mg/dL   Total Protein 6.4 (L) 6.5 - 8.1 g/dL  Albumin 2.7 (L) 3.5 - 5.0 g/dL   AST 109 (H) 15 - 41 U/L   ALT 125 (H) 0 - 44 U/L   Alkaline Phosphatase 68 38 - 126 U/L   Total Bilirubin 1.2 0.3 - 1.2 mg/dL   GFR, Estimated 20 (L) >60 mL/min    Comment: (NOTE) Calculated using the CKD-EPI Creatinine Equation (2021)    Anion gap 16 (H) 5 - 15    Comment: Performed at [facility], [address] [place], [place] [address]  Brain natriuretic peptide     Status: Abnormal   Collection Time: 12/18/22 10:38 AM  Result Value Ref Range   B Natriuretic Peptide 926.7 (H) 0.0 - 100.0 pg/mL    Comment: Performed at [facility], [place], [place] [address]  Troponin I (High Sensitivity)     Status: Abnormal   Collection Time: 12/18/22 10:38 AM  Result Value Ref Range   Troponin I (High Sensitivity) 80 (H) <18 ng/L    Comment: (NOTE) Elevated high sensitivity troponin I (hsTnI) values and significant  changes across serial measurements may suggest ACS but many other  chronic and acute conditions are known to elevate hsTnI results.  Refer to the "Links" section for chest pain algorithms and additional  guidance. Performed at [facility], [address] [place], [place] [address]   Urinalysis, Routine w reflex microscopic -Urine, Clean Catch     Status: Abnormal   Collection Time: 12/18/22  2:05 PM  Result Value Ref Range   Color, Urine YELLOW (A) YELLOW   APPearance CLOUDY (A) CLEAR   Specific Gravity, Urine 1.019 1.005 - 1.030   pH 5.0 5.0 - 8.0   Glucose, UA NEGATIVE NEGATIVE mg/dL   Hgb urine dipstick MODERATE (A) NEGATIVE   Bilirubin Urine NEGATIVE NEGATIVE   Ketones, ur NEGATIVE NEGATIVE mg/dL   Protein, ur 30 (A) NEGATIVE mg/dL   Nitrite NEGATIVE NEGATIVE   Leukocytes,Ua LARGE (A) NEGATIVE   RBC / HPF 11-20 0 - 5 RBC/hpf   WBC, UA  >50 0 - 5 WBC/hpf   Bacteria, UA RARE (A) NONE SEEN   Squamous Epithelial / HPF 0-5 0 - 5 /HPF   WBC Clumps PRESENT    Hyaline Casts, UA PRESENT     Comment: Performed at [facility], [place], [place] [address]  Troponin I (High Sensitivity)     Status: Abnormal   Collection Time: 12/18/22  3:50 PM  Result Value Ref Range   Troponin I (High Sensitivity) 66 (H) <18 ng/L    Comment: (NOTE) Elevated high sensitivity troponin I (hsTnI) values and significant  changes across serial measurements may suggest ACS but many other  chronic and acute conditions are known to elevate hsTnI results.  Refer to the "Links" section for chest pain algorithms and additional  guidance. Performed at [facility], [address] [place], [place] [address]   Lactic acid, plasma     Status: Abnormal   Collection Time: 12/18/22  3:50 PM  Result Value Ref Range   Lactic Acid, Venous 2.3 (HH) 0.5 - 1.9 mmol/L    Comment: CRITICAL RESULT CALLED TO, READ BACK BY AND VERIFIED WITH SANGITA [facility] ON 12/18/22 AT 1622 QSD Performed at [facility], [address] [place],  [address]     CT RENAL STONE STUDY  Result Date: 12/18/2022 CLINICAL DATA:  Urinary tract infection.  Acute kidney injury. EXAM: CT ABDOMEN AND PELVIS WITHOUT CONTRAST TECHNIQUE:  Multidetector CT imaging of the abdomen and pelvis was performed following the standard protocol without IV contrast. RADIATION DOSE REDUCTION: This exam was performed according to the departmental dose-optimization program which includes automated exposure control, adjustment of the mA and/or kV according to patient size and/or use of iterative reconstruction technique. COMPARISON:  /11/24 CT scan FINDINGS: Lower chest: Small bilateral pleural effusions, possible loculation in the left major fissure. Prominent cardiomegaly. Mitral valve prosthesis. Pacer leads not substantially changed. Mild aortic valve calcification.  Descending thoracic aortic atherosclerotic vascular calcification. Patchy ground-glass opacities in both lower lobes, somewhat obscured by motion artifact, cannot exclude alveolitis or edema. Hepatobiliary: Focal steatosis along the falciform ligament. Gallbladder borderline distended but otherwise unremarkable. Common bile duct borderline prominent at 0.8 cm. Pancreas: Unremarkable Spleen: Unremarkable Adrenals/Urinary Tract: No significant renal or adrenal findings. Urinary bladder wall thickness appears normal for the degree of distension. Stomach/Bowel: Unremarkable Vascular/Lymphatic: Atherosclerosis is present, including aortoiliac atherosclerotic disease. Reproductive: Uterus absent.  Adnexa unremarkable. Other: No supplemental non-categorized findings. Musculoskeletal: No change in the prior compression fractures at T8 and L1. stable appearance of bilateral rib deformities from previous fractures. IMPRESSION: 1. Small bilateral pleural effusions, possible loculation in the left major fissure. 2. Patchy ground-glass opacities in both lower lobes, somewhat obscured by motion artifact, cannot exclude alveolitis or edema. 3. Prominent cardiomegaly. 4. Old compression fractures at T8 and L1. Old bilateral rib deformities. 5. Aortic and systemic atherosclerosis. Aortic Atherosclerosis (ICD10-I70.0). Electronically Signed   By: Van Clines M.D.   On: 12/18/2022 14:43   CT HEAD WO CONTRAST (5MM)  Result Date: 12/18/2022 CLINICAL DATA:  Provided history: Memory loss. EXAM: CT HEAD WITHOUT CONTRAST TECHNIQUE: Contiguous axial images were obtained from the base of the skull through the vertex without intravenous contrast. RADIATION DOSE REDUCTION: This exam was performed according to the departmental dose-optimization program which includes automated exposure control, adjustment of the mA and/or kV according to patient size and/or use of iterative reconstruction technique. COMPARISON:  Head CT 11/30/2022.  FINDINGS: Brain: Moderate to advanced generalized cerebral atrophy. Mild cerebellar atrophy. Redemonstrated chronic cortical/subcortical infarcts within posterior left insula, posterior left temporal lobe, left parietal lobe and left occipital lobe. Redemonstrated small chronic cortically based infarct within the right middle frontal gyrus (series 2, image 25). Background moderate patchy and ill-defined hypoattenuation within the cerebral white matter, nonspecific but compatible with chronic small vessel ischemic disease. Redemonstrated small chronic infarct within the inferior right cerebellar hemisphere. There is no acute intracranial hemorrhage. No acute demarcated cortical infarct. No extra-axial fluid collection. No evidence of an intracranial mass. No midline shift. Vascular: No hyperdense vessel. Atherosclerotic calcifications. Skull: No fracture or aggressive osseous lesion. Sinuses/Orbits: No mass or acute finding within the imaged orbits. Small mucous retention cyst within the right sphenoid sinus. IMPRESSION: 1.  No evidence of an acute intracranial abnormality. 2. Parenchymal atrophy, chronic small vessel ischemic disease and chronic infarcts as described. 3. Small mucous retention cyst within the right sphenoid sinus. Electronically Signed   By: Kellie Simmering D.O.   On: 12/18/2022 12:23   DG Chest Portable 1 View  Result Date: 12/18/2022 CLINICAL DATA:  Shortness of breath EXAM: PORTABLE CHEST 1 VIEW COMPARISON:  X-ray 11/30/2022 FINDINGS: Enlarged cardiopericardial silhouette with calcified aorta. Left chest pacemaker. Prosthetic aortic valve. Diffuse vascular congestion. Tiny right effusion. Mild edema. No consolidation. Dense nodule once again at the right lung base. Right shoulder arthroplasty at the edge of the imaging field. Likely related to the right ribcage as per CT scan  of 11/30/2022 abdomen pelvis. IMPRESSION: Enlarged heart with prosthetic aortic valve. Calcified aorta. Pacemaker.  Vascular congestion and mild edema. Tiny right effusion. Electronically Signed   By: Jill Side M.D.   On: 12/18/2022 11:07    Review of Systems Blood pressure (!) 112/58, pulse 62, temperature (!) 96.2 F (35.7 C), resp. rate 16, SpO2 95 %. Physical Exam Patient is alert, but is somewhat demented.  It is very difficult to obtain history.  Vital signs are reviewed.  She moves her lower extremities spontaneously, does not appear to have any baseline discomfort.  Faint popliteal pulses are present, I cannot appreciate pedal pulses but feet are warm with brisk capillary refill.  She does have some discoloration of the toes, slight hammertoe deformity bilaterally, no overt eschar or open wounds.  Looking back at PVL study done in June of last year, she actually has very good posterior tibial waveForms.  DPs are diminished. Assessment/Plan: At this point in time would reassess once her low-grade sepsis resolves for improved perfusion distally.  She has no acute ischemic event.  Chronic ischemia certainly is present based on her prior PVL study previously, but with her AKI upon admission resuscitation and antibiotic administration would be preferential prior to investigating her feet.  Again, she does not overtly complain of anything here.  We will see what happens over the weekend perhaps get a noninvasive study Monday prior to committing her to any contrast based workup that would potentially be detrimental to her kidneys.  Zara Chess 12/18/2022, 4:45 PM

## 2022-12-18 NOTE — Assessment & Plan Note (Signed)
-   Continue home Tamoxifen

## 2022-12-18 NOTE — Assessment & Plan Note (Signed)
Hold home Lisinopril in the setting of AKI

## 2022-12-18 NOTE — Assessment & Plan Note (Signed)
Patient has a history PBC and cirrhosis with transaminitis.

## 2022-12-18 NOTE — ED Notes (Signed)
IV attempt x 1 unsuccessful. ?

## 2022-12-18 NOTE — Assessment & Plan Note (Deleted)
Patient presenting with altered mental status with urinalysis demonstrating moderate hematuria, large leukocytes and rare bacteria.    - Ceftriaxone 1 g daily - Urine culture pending

## 2022-12-18 NOTE — Progress Notes (Signed)
Attempted to place patient on remote tele. Per monitor tech, patient must be moved to A bed in order to monitor. Unable to "virtually" move patient until A bed in clean to prevent delay in monitoring.

## 2022-12-18 NOTE — ED Triage Notes (Signed)
[facility] for "abnormal chest xray", ems reports fluid on lungs. Also reports +UTI.  EMS reports pt alert and oriented, pt disoriented x4.

## 2022-12-18 NOTE — Assessment & Plan Note (Addendum)
Placed back on Eliquis.

## 2022-12-18 NOTE — Assessment & Plan Note (Signed)
Initial concern for acute on chronic heart failure exacerbation given pulmonary edema on chest x-ray and elevated BNP.  On exam, she does not appear significantly hypervolemic family notes that she has had poor p.o. intake over the last few days.  She received one-time dose of Lasix in the ED.  Will hold further diuretics at this time and reassess daily.  She may ultimately require some IV fluids back.  - Daily weights - Strict in and out - S/p one-time dose of Lasix 40 mg IV

## 2022-12-18 NOTE — Consult Note (Signed)
ANTICOAGULATION CONSULT NOTE  Pharmacy Consult for IV Heparin Indication:  PAD / limb ischemia  Patient Measurements:   Heparin Dosing Weight: 64.5 kg  Labs: Recent Labs    12/18/22 1038  HGB 11.1*  HCT 38.7  PLT 178  CREATININE 2.38*  TROPONINIHS 80*    Estimated Creatinine Clearance: 18.1 mL/min (A) (by C-G formula based on SCr of 2.38 mg/dL (H)).   Medical History: Past Medical History:  Diagnosis Date   Alcohol use    Anemia    Anxiety    Atrial fibrillation ([place])    B12 deficiency    Breast cancer ([place])    RT Lumpectomy c radiation 2012   Breast cancer (HCC)    CHF (congestive heart failure) (HCC)    Chronic diastolic CHF (congestive heart failure) (HCC)    Chronic kidney disease    Cirrhosis (HCC)    CKD (chronic kidney disease), stage III (HCC)    COPD (chronic obstructive pulmonary disease) (HCC)    Dementia ([place])    Depression    Family history of breast cancer    Family history of esophageal cancer    Family history of multiple myeloma    GERD (gastroesophageal reflux disease)    Headache    Heart murmur    HLD (hyperlipidemia)    HTN (hypertension)    Hyperlipemia    Hyperlipidemia    Hypertension    Hypothyroid    Hypothyroidism    Iron (Fe) deficiency anemia    Monoclonal gammopathy    Pacemaker    Personal history of radiation therapy    Presence of permanent cardiac pacemaker    Shortness of breath dyspnea    Thrombocytopenia (HCC)    Vitamin D deficiency     Medications:  Apixaban 5 mg BID prior to admission; medication reconciliation is pending. Un-known last patient reported dose  Assessment: 77 y/o F with medical history as above and including Afib on apixaban who is admitted with acute encephalopathy, UTI, AKI, acute CHF, and PAD (purple feet noted on clinical exam and Doppler pulses only on left). Vascular surgery consulted. Pharmacy consulted to initiate and manage heparin infusion for PAD / limb ischemia.   Baseline CBC  notable for anemia (Hgb 11.1, appears c/w baseline). Baseline aPTT, PT-INR, and heparin level are pending.  Goal of Therapy:  Heparin level 0.3-0.7 units/ml aPTT 66 - 102 seconds Monitor platelets by anticoagulation protocol: Yes   Plan:  --Heparin 4000 unit IV bolus followed by continuous infusion at 800 units/hr --aPTT 8 hours after initiation of infusion. Anticipate interference of apixaban on heparin level. Will follow aPTT until correlation is established --Daily CBC per protocol while on IV heparin  Benita Gutter 12/18/2022,3:48 PM

## 2022-12-18 NOTE — ED Notes (Signed)
Pt placed on 3L [place]

## 2022-12-18 NOTE — Assessment & Plan Note (Signed)
Patient presenting with elevated creatinine today compared to baseline.  Family states poor p.o. intake over the last several days since being at rehabilitation center.  Difficult to discern at this time if this is secondary to poor p.o. intake versus heart failure.  CT of the renal system obtained with no evidence of stone or hydronephrosis.  - One-time dose of IV Lasix - Repeat BMP in the evening - If increasing creatinine, will give back fluids - Avoid nephrotoxic agents

## 2022-12-18 NOTE — Assessment & Plan Note (Addendum)
On examination, patient's feet are purple with Doppler pulses only on the left.  Tenderness to palpation noted.  Unclear on chronicity and patient is on Eliquis.  - Vascular surgery consulted; appreciate their recommendations - Heparin infusion per pharmacy dosing

## 2022-12-18 NOTE — ED Notes (Signed)
RN Lovena Le and RN Bri attempted to place straight catheter and were unsuccessful due to anatomical inflammation and reports of severe pain by pt. Purewick placed at this time. MD notified.

## 2022-12-18 NOTE — H&P (Signed)
History and Physical    Patient: Adriana Martin [id number] DOB: [birth year] DOA: 12/18/2022 DOS: the patient was seen and examined on 12/18/2022 PCP: Sallee Lange, NP  Patient coming from: SNF  Chief Complaint:  Chief Complaint  Patient presents with   Shortness of Breath   HPI: Adriana Martin is a 77 y.o. female with medical history significant of HFpEF with last EF of 65-70%, mitral valve replacement, atrial fibrillation on Eliquis, COPD, chronic hypoxic respiratory failure on 2 L, hypertension, CKD stage III, hyperlipidemia, hypothyroidism, breast cancer on tamoxifen, MGUS, frequent UTIs who presents to the ED due to altered mental status.  History obtained through chart review due to patient's altered mental status.  Per chart review, [facility], increasing agitation and disorientation, and possible urinary tract infection. Staff at SNF noted that patient was having some shortness of breath that is why chest x-ray was obtained.  Patient was not on her home oxygen.  Urinalysis at the center demonstrated possible urinary tract infection with elevated WBC in the urine in addition to an AKI with creatinine of 2.6.  Lasix was not administered due to AKI.  They note that patient is bedbound at rest.  Patient is able to tell me her name, but otherwise is unable to answer any other questions.  Collateral history obtained from patient's daughters.  Patient has been sitting more frequently in her own stool and due to this has led to some general irritation.  They are worried that this may have led to the UTI.  In addition, patient has been having some diarrhea.  They were unaware of any shortness of breath.  ED course: On arrival to the ED, patient was normotensive at 109/61 with heart rate of 74.  She was saturating at 100% on 3 L before being weaned down to her home 2 L.  She was afebrile at 97.5.  Initial workup notable  for WBC of 14.4, hemoglobin of 11.1, glucose of 184, BUN 49, creatinine 2.38, anion gap 16, AST 109, ALT 125, and GFR of 20.  BNP elevated at 326.  Troponin elevated at 80 consistent with prior evaluations.  Chest x-ray was obtained that demonstrated vascular congestion with mild edema and small right effusion.  CT of the head was obtained that demonstrated no acute intracranial abnormalities.  Patient started on IV Lasix.  TRH contacted for admission.  Review of Systems: unable to review all systems due to the inability of the patient to answer questions.  Past Medical History:  Diagnosis Date   Alcohol use    Anemia    Anxiety    Atrial fibrillation ([place])    B12 deficiency    Breast cancer ([place])    RT Lumpectomy c radiation 2012   Breast cancer (HCC)    CHF (congestive heart failure) (HCC)    Chronic diastolic CHF (congestive heart failure) (HCC)    Chronic kidney disease    Cirrhosis (HCC)    CKD (chronic kidney disease), stage III (HCC)    COPD (chronic obstructive pulmonary disease) (HCC)    Dementia ([place])    Depression    Family history of breast cancer    Family history of esophageal cancer    Family history of multiple myeloma    GERD (gastroesophageal reflux disease)    Headache    Heart murmur    HLD (hyperlipidemia)    HTN (hypertension)    Hyperlipemia    Hyperlipidemia  Hypertension    Hypothyroid    Hypothyroidism    Iron (Fe) deficiency anemia    Monoclonal gammopathy    Pacemaker    Personal history of radiation therapy    Presence of permanent cardiac pacemaker    Shortness of breath dyspnea    Thrombocytopenia (HCC)    Vitamin D deficiency    Past Surgical History:  Procedure Laterality Date   ABDOMINAL HYSTERECTOMY     Partial   ABLATION     APPENDECTOMY     BICEPT TENODESIS Right 01/07/2018   Procedure: BICEPS TENODESIS;  Surgeon: Leim Fabry, MD;  Location: [facility];  Service: Orthopedics;  Laterality: Right;   BREAST BIOPSY Right 2012    BREAST BIOPSY Right 07/21/2019   venus clip, [place] Bx,positive   BREAST LUMPECTOMY Right 2012   positive/rad   BREAST SURGERY     CARDIAC SURGERY     CARDIAC VALVE REPLACEMENT     COLONOSCOPY  2012   ELECTROPHYSIOLOGIC STUDY N/A 01/23/2015   Procedure: CARDIOVERSION;  Surgeon: Corey Skains, MD;  Location: [facility];  Service: Cardiovascular;  Laterality: N/A;   ELECTROPHYSIOLOGIC STUDY N/A 05/27/2016   Procedure: CARDIOVERSION;  Surgeon: Corey Skains, MD;  Location: [facility];  Service: Cardiovascular;  Laterality: N/A;   ESOPHAGOGASTRODUODENOSCOPY  2012   EYE SURGERY     IR CT HEAD LTD  03/10/2020   IR PERCUTANEOUS ART THROMBECTOMY/INFUSION INTRACRANIAL INC DIAG ANGIO  03/10/2020   IR US GUIDE VASC ACCESS RIGHT  03/10/2020   MASTECTOMY Right    Mastectomy Right    MASTECTOMY WITH AXILLARY LYMPH NODE DISSECTION Right 08/09/2019   Procedure: MASTECTOMY WITH AXILLARY LYMPH NODE DISSECTION;  Surgeon: Robert Bellow, MD;  Location: [facility];  Service: General;  Laterality: Right;   MITRAL VALVE REPLACEMENT     mvp     PACEMAKER PLACEMENT N/A    RADIOLOGY WITH ANESTHESIA N/A 03/10/2020   Procedure: IR WITH ANESTHESIA;  Surgeon: Radiologist, Medication, MD;  Location: [facility];  Service: Radiology;  Laterality: N/A;   REVERSE SHOULDER ARTHROPLASTY Right 01/07/2018   Procedure: REVERSE SHOULDER ARTHROPLASTY;  Surgeon: Leim Fabry, MD;  Location: [facility];  Service: Orthopedics;  Laterality: Right;   VSD REPAIR     VSD REPAIR N/A    Social History:  reports that she has never smoked. She has never used smokeless tobacco. She reports that she does not currently use alcohol. She reports that she does not use drugs.  Allergies  Allergen Reactions   Atorvastatin Shortness Of Breath    Tolerates pravastatin   Calcium Shortness Of Breath   Fluoxetine     Other reaction(s): Unknown   Hydrochlorothiazide     Other reaction(s): Unknown    Family History  Problem Relation Age of Onset    Multiple myeloma Father    Esophageal cancer Brother    Breast cancer Mother        never treated, dx. >50   Esophageal cancer Brother    Breast cancer Daughter 46   Cancer Daughter 64       rare blood cancer    Prior to Admission medications   Medication Sig Start Date End Date Taking? Authorizing Provider  ACETAMINOPHEN EXTRA STRENGTH 500 MG capsule Take 1 Dose by mouth as needed. 08/12/21   [provider]  ADVAIR DISKUS 100-50 MCG/DOSE AEPB Inhale 1 puff into the lungs 2 (two) times daily. 04/15/20   [provider]  ELIQUIS 5 MG TABS tablet Take 5  mg by mouth 2 (two) times daily. 03/05/21   [provider]  ezetimibe (ZETIA) 10 MG tablet Take 10 mg by mouth daily. 02/05/21   [provider]  feeding supplement (ENSURE ENLIVE / ENSURE PLUS) LIQD Take 237 mLs by mouth 2 (two) times daily between meals. 12/04/22   Enzo Bi, MD  furosemide (LASIX) 20 MG tablet Take 20 mg by mouth daily.    [provider]  ipratropium-albuterol (DUONEB) 0.5-2.5 (3) MG/3ML SOLN Take 3 mLs by nebulization 3 (three) times daily.    [provider]  levothyroxine (SYNTHROID) 50 MCG tablet Take 50 mcg by mouth daily before breakfast. 02/05/21   [provider]  lisinopril (ZESTRIL) 5 MG tablet TAKE (1) TABLET BY MOUTH EVERY DAY TO PROTECT KIDNEYS 07/21/21   [provider]  Magnesium 200 MG TABS Take 200 mg by mouth daily at 6 (six) AM.    [provider]  melatonin 5 MG TABS Take 5 mg by mouth at bedtime.    [provider]  memantine (NAMENDA) 10 MG tablet Take 5 mg by mouth 2 (two) times daily.    [provider]  Multiple Vitamin (MULTIVITAMIN) tablet Take 1 tablet by mouth daily.    [provider]  pantoprazole (PROTONIX) 40 MG tablet Take 1 tablet (40 mg total) by mouth 2 (two) times daily. 04/28/21   Nolberto Hanlon, MD  polyethylene glycol (MIRALAX / GLYCOLAX) 17 g packet Take 17 g by mouth 2 (two) times  daily. Dissolve 17 grams in 8 ounces of any liquid of pt's choice. 12/04/22   Enzo Bi, MD  potassium chloride (KLOR-CON) 10 MEQ tablet Take 10 mEq by mouth 2 (two) times daily. 11/11/21   [provider]  pravastatin (PRAVACHOL) 40 MG tablet Take 40 mg by mouth at bedtime. 04/02/21   [provider]  predniSONE (DELTASONE) 5 MG tablet Take 5 mg by mouth daily with breakfast.    [provider]  tamoxifen (NOLVADEX) 20 MG tablet TAKE (1) TABLET BY MOUTH EVERY DAY 09/07/22   Verlon Au, NP  ursodiol (ACTIGALL) 300 MG capsule Take 2 capsules by mouth 2 (two) times daily. 01/19/22   [provider]  venlafaxine XR (EFFEXOR-XR) 37.5 MG 24 hr capsule Take 75 mg by mouth daily. 01/14/20   [provider]  VENTOLIN HFA 108 (90 Base) MCG/ACT inhaler Inhale 1 puff into the lungs every 4 (four) hours as needed. 01/31/21   [provider]    Physical Exam: Vitals:   12/18/22 1035 12/18/22 1038 12/18/22 1430 12/18/22 1517  BP:  109/61 (!) 145/74 (!) 142/65  Pulse:  62 61 (!) 53  Resp:  (!) 22 (!) 22 16  Temp:  (!) 97.5 F (36.4 C)  (!) 97.2 F (36.2 C)  SpO2: (!) 86% 100% 98% 97%   Physical Exam Vitals and nursing note reviewed.  Constitutional:      General: She is not in acute distress.    Appearance: She is obese.  HENT:     Head: Normocephalic and atraumatic.     Mouth/Throat:     Mouth: Mucous membranes are moist.     Pharynx: Oropharynx is clear.  Eyes:     Conjunctiva/sclera: Conjunctivae normal.     Pupils: Pupils are equal, round, and reactive to light.  Cardiovascular:     Rate and Rhythm: Normal rate and regular rhythm.     Heart sounds: Murmur (Systolic murmur 2/6) heard.     Comments:  DP pulses are unable to be palpated.  Left DP pulse obtained with Doppler.  Unable to obtain right DP pulse Pulmonary:     Effort: Pulmonary effort is normal. No tachypnea.     Breath sounds: Rales (Bilateral rails) present. No wheezing or  rhonchi.  Abdominal:     Palpations: Abdomen is soft.     Tenderness: There is no abdominal tenderness. There is no guarding.  Musculoskeletal:     Right lower leg: 1+ Pitting Edema present.     Left lower leg: 1+ Pitting Edema present.  Skin:    Comments: Bilateral feet with significant cyanosis with tenderness to palpation and cool to touch.   Neurological:     Mental Status: She is alert.    Data Reviewed: CBC with WBC of 14.4, hemoglobin of 11.1, MCV of 93.7, platelets of 178 CMP with sodium of 142, potassium 4.3, bicarb 22, glucose 184, BUN 49, creatinine 2.38, calcium 8.6, anion gap 16, AST 109, ALT 125, GFR 20 BNP elevated at 326 Troponin elevated at 80 EKG personally evaluated.  Wide-complex rhythm, potentially atrial flutter with a 5-1 block.  Right bundle branch block appears chronic.  Compared to EKG obtained on November 30, 2022, no changes noted.  CT RENAL STONE STUDY  Result Date: 12/18/2022 CLINICAL DATA:  Urinary tract infection.  Acute kidney injury. EXAM: CT ABDOMEN AND PELVIS WITHOUT CONTRAST TECHNIQUE: Multidetector CT imaging of the abdomen and pelvis was performed following the standard protocol without IV contrast. RADIATION DOSE REDUCTION: This exam was performed according to the departmental dose-optimization program which includes automated exposure control, adjustment of the mA and/or kV according to patient size and/or use of iterative reconstruction technique. COMPARISON:  /11/24 CT scan FINDINGS: Lower chest: Small bilateral pleural effusions, possible loculation in the left major fissure. Prominent cardiomegaly. Mitral valve prosthesis. Pacer leads not substantially changed. Mild aortic valve calcification. Descending thoracic aortic atherosclerotic vascular calcification. Patchy ground-glass opacities in both lower lobes, somewhat obscured by motion artifact, cannot exclude alveolitis or edema. Hepatobiliary: Focal steatosis along the falciform ligament. Gallbladder  borderline distended but otherwise unremarkable. Common bile duct borderline prominent at 0.8 cm. Pancreas: Unremarkable Spleen: Unremarkable Adrenals/Urinary Tract: No significant renal or adrenal findings. Urinary bladder wall thickness appears normal for the degree of distension. Stomach/Bowel: Unremarkable Vascular/Lymphatic: Atherosclerosis is present, including aortoiliac atherosclerotic disease. Reproductive: Uterus absent.  Adnexa unremarkable. Other: No supplemental non-categorized findings. Musculoskeletal: No change in the prior compression fractures at T8 and L1. stable appearance of bilateral rib deformities from previous fractures. IMPRESSION: 1. Small bilateral pleural effusions, possible loculation in the left major fissure. 2. Patchy ground-glass opacities in both lower lobes, somewhat obscured by motion artifact, cannot exclude alveolitis or edema. 3. Prominent cardiomegaly. 4. Old compression fractures at T8 and L1. Old bilateral rib deformities. 5. Aortic and systemic atherosclerosis. Aortic Atherosclerosis (ICD10-I70.0). Electronically Signed   By: Van Clines M.D.   On: 12/18/2022 14:43   CT HEAD WO CONTRAST (5MM)  Result Date: 12/18/2022 CLINICAL DATA:  Provided history: Memory loss. EXAM: CT HEAD WITHOUT CONTRAST TECHNIQUE: Contiguous axial images were obtained from the base of the skull through the vertex without intravenous contrast. RADIATION DOSE REDUCTION: This exam was performed according to the departmental dose-optimization program which includes automated exposure control, adjustment of the mA and/or kV according to patient size and/or use of iterative reconstruction technique. COMPARISON:  Head CT 11/30/2022. FINDINGS: Brain: Moderate to advanced generalized cerebral atrophy. Mild cerebellar atrophy. Redemonstrated chronic cortical/subcortical infarcts within posterior left insula,  posterior left temporal lobe, left parietal lobe and left occipital lobe. Redemonstrated  small chronic cortically based infarct within the right middle frontal gyrus (series 2, image 25). Background moderate patchy and ill-defined hypoattenuation within the cerebral white matter, nonspecific but compatible with chronic small vessel ischemic disease. Redemonstrated small chronic infarct within the inferior right cerebellar hemisphere. There is no acute intracranial hemorrhage. No acute demarcated cortical infarct. No extra-axial fluid collection. No evidence of an intracranial mass. No midline shift. Vascular: No hyperdense vessel. Atherosclerotic calcifications. Skull: No fracture or aggressive osseous lesion. Sinuses/Orbits: No mass or acute finding within the imaged orbits. Small mucous retention cyst within the right sphenoid sinus. IMPRESSION: 1.  No evidence of an acute intracranial abnormality. 2. Parenchymal atrophy, chronic small vessel ischemic disease and chronic infarcts as described. 3. Small mucous retention cyst within the right sphenoid sinus. Electronically Signed   By: Kellie Simmering D.O.   On: 12/18/2022 12:23   DG Chest Portable 1 View  Result Date: 12/18/2022 CLINICAL DATA:  Shortness of breath EXAM: PORTABLE CHEST 1 VIEW COMPARISON:  X-ray 11/30/2022 FINDINGS: Enlarged cardiopericardial silhouette with calcified aorta. Left chest pacemaker. Prosthetic aortic valve. Diffuse vascular congestion. Tiny right effusion. Mild edema. No consolidation. Dense nodule once again at the right lung base. Right shoulder arthroplasty at the edge of the imaging field. Likely related to the right ribcage as per CT scan of 11/30/2022 abdomen pelvis. IMPRESSION: Enlarged heart with prosthetic aortic valve. Calcified aorta. Pacemaker. Vascular congestion and mild edema. Tiny right effusion. Electronically Signed   By: Jill Side M.D.   On: 12/18/2022 11:07    Results are pending, will review when available.  Assessment and Plan:  * Acute encephalopathy Patient is presenting with increased  disorientation and agitation with an underlying history of dementia. Family said she has gradually declined over the last 1 month but very much declined since her last admission 2 weeks ago.  Differential for etiology at this point is broad but includes infectious etiology such as UTI, versus AKI due to poor p.o. intake.  Unclear if her vascular disease could be contributing.  - N.p.o. until improvement in mental status - SLP evaluation - Management of UTI and AKI as noted below  UTI (urinary tract infection) Patient presenting with altered mental status with urinalysis demonstrating moderate hematuria, large leukocytes and rare bacteria.    - Ceftriaxone 1 g daily - Urine culture pending  AKI (acute kidney injury) ([place]) Patient presenting with elevated creatinine today compared to baseline.  Family states poor p.o. intake over the last several days since being at rehabilitation center.  Difficult to discern at this time if this is secondary to poor p.o. intake versus heart failure.  CT of the renal system obtained with no evidence of stone or hydronephrosis.  - One-time dose of IV Lasix - Repeat BMP in the evening - If increasing creatinine, will give back fluids - Avoid nephrotoxic agents  Acute on chronic diastolic congestive heart failure (HCC) Initial concern for acute on chronic heart failure exacerbation given pulmonary edema on chest x-ray and elevated BNP.  On exam, she does not appear significantly hypervolemic family notes that she has had poor p.o. intake over the last few days.  She received one-time dose of Lasix in the ED.  Will hold further diuretics at this time and reassess daily.  She may ultimately require some IV fluids back.  - Daily weights - Strict in and out - S/p one-time dose of Lasix 40 mg  IV  PAD (peripheral artery disease) (HCC) On examination, patient's feet are purple with Doppler pulses only on the left.  Tenderness to palpation noted.  Unclear on  chronicity and patient is on Eliquis.  - Vascular surgery consulted; appreciate their recommendations - Heparin infusion per pharmacy dosing   Primary biliary cholangitis ([place]) Patient has a history PBC and cirrhosis with elevated LFTs today of unclear significance.  No tenderness on palpation. Previous history of elevated LFTs intermittenty  - Repeat CMP in the a.m.  Atrial flutter ([place]) Currently in atrial flutter with 5:1 block. Previous EKGs with similar rhythm.  - On heparin infusion  Dementia ([place]) - Delirium precautions - Continue home Memantine  Essential hypertension - Hold home Lisinopril in the setting of AKI   COPD (chronic obstructive pulmonary disease) (HCC) No wheezing on examination.   - Continue home bronchodilators.  - Continue home supplemental oxygen at 2L  Breast cancer ([place]) - Continue home Tamoxifen  Advance Care Planning:   Code Status: DNR/DNI.  Per patient's advanced directive, she would not want to be placed on life support if there was a low chance for meaningful recovery.  Per previous discussions with patient's daughter, they would not want resuscitation in the event of cardiac or pulmonary arrest.  Consults: Vascular surgery  Family Communication: No family at bedside  Severity of Illness: The appropriate patient status for this patient is OBSERVATION. Observation status is judged to be reasonable and necessary in order to provide the required intensity of service to ensure the patient's safety. The patient's presenting symptoms, physical exam findings, and initial radiographic and laboratory data in the context of their medical condition is felt to place them at decreased risk for further clinical deterioration. Furthermore, it is anticipated that the patient will be medically stable for discharge from the hospital within 2 midnights of admission.   Author: Jose Persia, MD 12/18/2022 3:40 PM  For on call review [email or web address].

## 2022-12-18 NOTE — Assessment & Plan Note (Addendum)
No wheezing on examination.   - Continue home bronchodilators.  - Continue home supplemental oxygen at 2L

## 2022-12-19 DIAGNOSIS — I4892 Unspecified atrial flutter: Secondary | ICD-10-CM | POA: Diagnosis present

## 2022-12-19 DIAGNOSIS — B961 Klebsiella pneumoniae [K. pneumoniae] as the cause of diseases classified elsewhere: Secondary | ICD-10-CM | POA: Diagnosis present

## 2022-12-19 DIAGNOSIS — G9341 Metabolic encephalopathy: Secondary | ICD-10-CM

## 2022-12-19 DIAGNOSIS — J449 Chronic obstructive pulmonary disease, unspecified: Secondary | ICD-10-CM

## 2022-12-19 DIAGNOSIS — I739 Peripheral vascular disease, unspecified: Secondary | ICD-10-CM | POA: Diagnosis present

## 2022-12-19 DIAGNOSIS — K743 Primary biliary cirrhosis: Secondary | ICD-10-CM

## 2022-12-19 DIAGNOSIS — Z1612 Extended spectrum beta lactamase (ESBL) resistance: Secondary | ICD-10-CM | POA: Diagnosis present

## 2022-12-19 DIAGNOSIS — I10 Essential (primary) hypertension: Secondary | ICD-10-CM

## 2022-12-19 DIAGNOSIS — N179 Acute kidney failure, unspecified: Secondary | ICD-10-CM | POA: Diagnosis present

## 2022-12-19 DIAGNOSIS — J9611 Chronic respiratory failure with hypoxia: Secondary | ICD-10-CM | POA: Diagnosis present

## 2022-12-19 DIAGNOSIS — N3001 Acute cystitis with hematuria: Secondary | ICD-10-CM | POA: Diagnosis present

## 2022-12-19 DIAGNOSIS — F0390 Unspecified dementia without behavioral disturbance: Secondary | ICD-10-CM | POA: Diagnosis present

## 2022-12-19 DIAGNOSIS — Z7189 Other specified counseling: Secondary | ICD-10-CM | POA: Diagnosis not present

## 2022-12-19 DIAGNOSIS — N189 Chronic kidney disease, unspecified: Secondary | ICD-10-CM

## 2022-12-19 DIAGNOSIS — Z9981 Dependence on supplemental oxygen: Secondary | ICD-10-CM | POA: Diagnosis not present

## 2022-12-19 DIAGNOSIS — C50911 Malignant neoplasm of unspecified site of right female breast: Secondary | ICD-10-CM | POA: Diagnosis present

## 2022-12-19 DIAGNOSIS — G9340 Encephalopathy, unspecified: Secondary | ICD-10-CM | POA: Diagnosis present

## 2022-12-19 DIAGNOSIS — E039 Hypothyroidism, unspecified: Secondary | ICD-10-CM | POA: Diagnosis present

## 2022-12-19 DIAGNOSIS — I130 Hypertensive heart and chronic kidney disease with heart failure and stage 1 through stage 4 chronic kidney disease, or unspecified chronic kidney disease: Secondary | ICD-10-CM | POA: Diagnosis present

## 2022-12-19 DIAGNOSIS — Z515 Encounter for palliative care: Secondary | ICD-10-CM | POA: Diagnosis not present

## 2022-12-19 DIAGNOSIS — D472 Monoclonal gammopathy: Secondary | ICD-10-CM | POA: Diagnosis present

## 2022-12-19 DIAGNOSIS — J189 Pneumonia, unspecified organism: Secondary | ICD-10-CM | POA: Diagnosis present

## 2022-12-19 DIAGNOSIS — I5032 Chronic diastolic (congestive) heart failure: Secondary | ICD-10-CM

## 2022-12-19 DIAGNOSIS — Z952 Presence of prosthetic heart valve: Secondary | ICD-10-CM | POA: Diagnosis not present

## 2022-12-19 DIAGNOSIS — E669 Obesity, unspecified: Secondary | ICD-10-CM | POA: Diagnosis present

## 2022-12-19 DIAGNOSIS — N1832 Chronic kidney disease, stage 3b: Secondary | ICD-10-CM | POA: Diagnosis present

## 2022-12-19 DIAGNOSIS — I4891 Unspecified atrial fibrillation: Secondary | ICD-10-CM | POA: Diagnosis present

## 2022-12-19 DIAGNOSIS — J440 Chronic obstructive pulmonary disease with acute lower respiratory infection: Secondary | ICD-10-CM | POA: Diagnosis present

## 2022-12-19 DIAGNOSIS — R4182 Altered mental status, unspecified: Secondary | ICD-10-CM | POA: Diagnosis present

## 2022-12-19 DIAGNOSIS — I484 Atypical atrial flutter: Secondary | ICD-10-CM

## 2022-12-19 DIAGNOSIS — Z66 Do not resuscitate: Secondary | ICD-10-CM | POA: Diagnosis present

## 2022-12-19 LAB — CBC WITH DIFFERENTIAL/PLATELET
Abs Immature Granulocytes: 0.08 10*3/uL — ABNORMAL HIGH (ref 0.00–0.07)
Basophils Absolute: 0.1 10*3/uL (ref 0.0–0.1)
Basophils Relative: 0 %
Eosinophils Absolute: 0.2 10*3/uL (ref 0.0–0.5)
Eosinophils Relative: 1 %
HCT: 36.7 % (ref 36.0–46.0)
Hemoglobin: 11 g/dL — ABNORMAL LOW (ref 12.0–15.0)
Immature Granulocytes: 1 %
Lymphocytes Relative: 5 %
Lymphs Abs: 0.7 10*3/uL (ref 0.7–4.0)
MCH: 26.8 pg (ref 26.0–34.0)
MCHC: 30 g/dL (ref 30.0–36.0)
MCV: 89.5 fL (ref 80.0–100.0)
Monocytes Absolute: 0.9 10*3/uL (ref 0.1–1.0)
Monocytes Relative: 7 %
Neutro Abs: 11.7 10*3/uL — ABNORMAL HIGH (ref 1.7–7.7)
Neutrophils Relative %: 86 %
Platelets: 164 10*3/uL (ref 150–400)
RBC: 4.1 MIL/uL (ref 3.87–5.11)
RDW: 17.2 % — ABNORMAL HIGH (ref 11.5–15.5)
WBC: 13.6 10*3/uL — ABNORMAL HIGH (ref 4.0–10.5)
nRBC: 1.2 % — ABNORMAL HIGH (ref 0.0–0.2)

## 2022-12-19 LAB — COMPREHENSIVE METABOLIC PANEL
ALT: 101 U/L — ABNORMAL HIGH (ref 0–44)
AST: 76 U/L — ABNORMAL HIGH (ref 15–41)
Albumin: 2.7 g/dL — ABNORMAL LOW (ref 3.5–5.0)
Alkaline Phosphatase: 67 U/L (ref 38–126)
Anion gap: 14 (ref 5–15)
BUN: 47 mg/dL — ABNORMAL HIGH (ref 8–23)
CO2: 23 mmol/L (ref 22–32)
Calcium: 8.7 mg/dL — ABNORMAL LOW (ref 8.9–10.3)
Chloride: 109 mmol/L (ref 98–111)
Creatinine, Ser: 2.18 mg/dL — ABNORMAL HIGH (ref 0.44–1.00)
GFR, Estimated: 23 mL/min — ABNORMAL LOW (ref >60)
Glucose, Bld: 86 mg/dL (ref 70–99)
Potassium: 4.7 mmol/L (ref 3.5–5.1)
Sodium: 146 mmol/L — ABNORMAL HIGH (ref 135–145)
Total Bilirubin: 0.9 mg/dL (ref 0.3–1.2)
Total Protein: 6.3 g/dL — ABNORMAL LOW (ref 6.5–8.1)

## 2022-12-19 LAB — APTT
aPTT: 56 seconds — ABNORMAL HIGH (ref 24–36)
aPTT: 34 seconds (ref 24–36)

## 2022-12-19 MED ORDER — TRAZODONE HCL 50 MG PO TABS
25.0000 mg | ORAL_TABLET | Freq: Every day | ORAL | Status: DC
  Administered 2022-12-19 – 2022-12-23 (×5): 25 mg via ORAL
  Filled 2022-12-19 (×5): qty 1

## 2022-12-19 MED ORDER — SODIUM CHLORIDE 0.9 % IV SOLN
2.0000 g | INTRAVENOUS | Status: DC
  Administered 2022-12-19: 2 g via INTRAVENOUS
  Filled 2022-12-19: qty 20

## 2022-12-19 MED ORDER — AZITHROMYCIN 500 MG PO TABS
500.0000 mg | ORAL_TABLET | Freq: Every day | ORAL | Status: AC
  Administered 2022-12-19: 500 mg via ORAL
  Filled 2022-12-19: qty 1

## 2022-12-19 MED ORDER — ENOXAPARIN SODIUM 80 MG/0.8ML IJ SOSY
1.0000 mg/kg | PREFILLED_SYRINGE | INTRAMUSCULAR | Status: DC
  Administered 2022-12-19 – 2022-12-20 (×2): 70 mg via SUBCUTANEOUS
  Filled 2022-12-19 (×3): qty 0.7

## 2022-12-19 MED ORDER — SODIUM CHLORIDE 0.9 % IV SOLN: INTRAVENOUS | Status: DC

## 2022-12-19 MED ORDER — HEPARIN BOLUS VIA INFUSION
950.0000 [IU] | Freq: Once | INTRAVENOUS | Status: AC
  Administered 2022-12-19: 950 [IU] via INTRAVENOUS
  Filled 2022-12-19: qty 950

## 2022-12-19 MED ORDER — AZITHROMYCIN 500 MG PO TABS
250.0000 mg | ORAL_TABLET | Freq: Every day | ORAL | Status: AC
  Administered 2022-12-20 – 2022-12-23 (×4): 250 mg via ORAL
  Filled 2022-12-19 (×4): qty 1

## 2022-12-19 NOTE — Assessment & Plan Note (Signed)
Klebsiella growing out of urine culture.  Continue Rocephin.

## 2022-12-19 NOTE — Assessment & Plan Note (Addendum)
Elevated BNP, last echocardiogram showed EF normal range.  Watch closely with IV fluid hydration.

## 2022-12-19 NOTE — Evaluation (Signed)
Physical Therapy Evaluation Patient Details Name: Adriana Martin MRN: [id number] DOB: Nov 13, 1945 Today's Date: 12/19/2022  History of Present Illness  Adriana Martin is a 77 y.o. female with medical history significant of HFpEF with last EF of 65-70%, mitral valve replacement, atrial fibrillation on Eliquis, COPD, chronic hypoxic respiratory failure on 2 L, hypertension, CKD stage III, hyperlipidemia, hypothyroidism, breast cancer on tamoxifen, MGUS, frequent UTIs who presents to the ED due to altered mental status. She was admitted from [facility], for treatment for acute encephalopathy, UTI, AKI, acute on chronic diastolic CHF, PAD, primary biliary cholangitis, atrial flutter, dementia, HTN, COPD, breast cancer. She uses 2L/min O2 at baseline.   Clinical Impression  Patient reclining in bed upon arrival with 2L/min O2. Daughter Adriana Martin at bedside throughout session. Patient is only able to state first name inconsistently and unable to reliably follow commands. Adriana Martin provides history due to limited cognition of patient. Patient most recently came from [facility] where she was staying for rehab after initial hospitalization about a month ago. She normally lives with her daughter Adriana Martin in a single story home with no stairs to enter. She states that before a month ago, patient ambulated with supervision at her home using a RW. She needed help that was provided by Adriana Martin and a caregiver for all aspects of care. Upon PT evaluation, patient required hand over hand cuing and total assist for bed mobility. She required max A for sit <> stand for a few seconds at edge of bed with PT blocking knees, supporting at B ischial tuberosities, and with pt hands draped over PT upper torso after being placed there by PT. When returning to bed, patient became agitated and resisted PT's attempts to keep torso upright so she would not hit her head on the bed rail. Patient currently requires SNF level care and would  require, heavy physical assist, a hospital bed and hoyer lift to return home safely. Patient would benefit from skilled physical therapy to address impairments and functional limitations (see PT Problem List below) to work towards stated goals and return to PLOF or maximal functional independence.    Recommendations for follow up therapy are one component of a multi-disciplinary discharge planning process, led by the attending physician.  Recommendations may be updated based on patient status, additional functional criteria and insurance authorization.  Follow Up Recommendations Can patient physically be transported by private vehicle: No     Assistance Recommended at Discharge Frequent or constant Supervision/Assistance  Patient can return home with the following  Assist for transportation;Direct supervision/assist for medications management;Assistance with cooking/housework;Two people to help with walking and/or transfers;Two people to help with bathing/dressing/bathroom;Help with stairs or ramp for entrance (hoyer for transfers)    Equipment Recommendations Hospital bed (hoyer lift)  Recommendations for Other Services       Functional Status Assessment Patient has had a recent decline in their functional status and demonstrates the ability to make significant improvements in function in a reasonable and predictable amount of time.     Precautions / Restrictions        Mobility  Bed Mobility Overal bed mobility: Needs Assistance Bed Mobility: Supine to Sit, Sit to Supine     Supine to sit: Total assist Sit to supine: Total assist   General bed mobility comments: Patient resisting PT to keep trunk away from bedrail with sit to supine.    Transfers Overall transfer level: Needs assistance   Transfers: Sit to/from Stand Sit to Stand: Max assist, From  elevated surface           General transfer comment: Patient completed sit <> stand breifly  at edge of bed with max A with  PT blocking knees, supporting and B ischial tuberosities, and after PT placed both arms around PT upper back. Unable to pivot or take steps safely.    Ambulation/Gait               General Gait Details: unsafe/unable to tolerate  Stairs            Wheelchair Mobility    Modified Rankin (Stroke Patients Only)       Balance Overall balance assessment: Needs assistance Sitting-balance support: No upper extremity supported, Feet supported Sitting balance-Leahy Scale: Poor Sitting balance - Comments: periods of cga/min assist but also requires max A to prevent fall backwards. Postural control: Posterior lean, Right lateral lean, Left lateral lean   Standing balance-Leahy Scale: Zero Standing balance comment: stood breifly at edge of bed with max A knees blocked, support at B ischial tuberosities, hands draped over PT shoulders.                             Pertinent Vitals/Pain Pain Assessment Pain Assessment: Faces Faces Pain Scale: Hurts even more Pain Location: B UE when PT helps patient with hand over hand cuing and adjusts patients position. Otherwise seems uncomfortable overall. Pain Descriptors / Indicators: Grimacing, Guarding, Moaning Pain Intervention(s): Limited activity within patient's tolerance, Monitored during session, Repositioned    Home Living Family/patient expects to be discharged to:: Private residence Living Arrangements: Children;Non-relatives/Friends (daugher Adriana Martin at night and caregiver by day) Available Help at Discharge: Family;Personal care attendant;Available 24 hours/day (daughter by day and caregiver by night) Type of Home: House Home Access: Level entry       Home Layout: One level Home Equipment: Conservation officer, nature (2 wheels);BSC/3in1;Shower seat;Toilet riser;Grab bars - tub/shower;Wheelchair - manual (home oxygen) Additional Comments: History obtained from daughter Adriana Martin as patient is unable to provide history per  cognitive limitations. Patient has been in [facility] for past month and she feels patient has gotten worse. She does not want patient to go back to [facility] and voiced desire to take patient home.    Prior Function Prior Level of Function : Needs assist             Mobility Comments: History from daugher Adriana Martin as patient unable to provide. Prior to getting COVID19 and being hospitalized and in SNF care for the last month, patient ambulated in the home with RW with supervision. She has had limited mobility in the last month while being institutionalized. ADLs Comments: Prior to a month ago, patient needed assistance with all aspects of care which was provided by her daughter and a caregiver.     Hand Dominance   Dominant Hand: Right    Extremity/Trunk Assessment   Upper Extremity Assessment Upper Extremity Assessment: Generalized weakness    Lower Extremity Assessment Lower Extremity Assessment:  (difficulty following commands, required blocking at knees to stand for a few seconds with max A)    Cervical / Trunk Assessment Cervical / Trunk Assessment: Normal  Communication   Communication: Other (comment);Expressive difficulties;HOH (Patient unable to reliably answer questions or communicate clearly. likely HOH and confused.)  Cognition Arousal/Alertness: Lethargic Behavior During Therapy:  (agetated at times) Overall Cognitive Status: Impaired/Different from baseline Area of Impairment: Orientation, Attention, Memory, Following commands, Safety/judgement, Awareness, Problem solving  Orientation Level:  (able to state first name inconsistently only)             General Comments: able to state first name inconsistently only, requires tactile cuing and physical assistance to follow some commands. occasionally resists without awareness of consequences of overpowering therapist (for example pushing torso backwards so that if PT allowed her she would hit her  head on bed rail).        General Comments General comments (skin integrity, edema, etc.): unable to get reading on SpO2    Exercises Other Exercises Other Exercises: educated daughter Adriana Martin about discharge reccomendations, role of PT in acute care setting.   Assessment/Plan    PT Assessment Patient needs continued PT services  PT Problem List Decreased range of motion;Decreased strength;Decreased activity tolerance;Decreased balance;Decreased mobility;Decreased coordination;Decreased cognition;Decreased knowledge of use of DME;Decreased safety awareness;Decreased knowledge of precautions;Cardiopulmonary status limiting activity;Obesity       PT Treatment Interventions DME instruction;Gait training;Functional mobility training;Therapeutic activities;Therapeutic exercise;Balance training;Patient/family education;Cognitive remediation;Neuromuscular re-education    PT Goals (Current goals can be found in the Care Plan section)  Acute Rehab PT Goals Patient Stated Goal: to go home or not go back to [facility] in order to get better PT Goal Formulation: With family Time For Goal Achievement: 01/02/23 Potential to Achieve Goals: Fair    Frequency Min 2X/week     Co-evaluation               AM-PAC PT "6 Clicks" Mobility  Outcome Measure Help needed turning from your back to your side while in a flat bed without using bedrails?: Total Help needed moving from lying on your back to sitting on the side of a flat bed without using bedrails?: Total Help needed moving to and from a bed to a chair (including a wheelchair)?: Total Help needed standing up from a chair using your arms (e.g., wheelchair or bedside chair)?: Total Help needed to walk in hospital room?: Total Help needed climbing 3-5 steps with a railing? : Total 6 Click Score: 6    End of Session Equipment Utilized During Treatment: Gait belt;Oxygen (2L/min) Activity Tolerance: Patient limited by fatigue;Patient limited by  lethargy;Treatment limited secondary to agitation Patient left: in bed;with call bell/phone within reach;with bed alarm set;with family/visitor present Nurse Communication: Mobility status PT Visit Diagnosis: Muscle weakness (generalized) (M62.81);Difficulty in walking, not elsewhere classified (R26.2)    Time: [id number] PT Time Calculation (min) (ACUTE ONLY): 37 min   Charges:   PT Evaluation $PT Eval Moderate Complexity: 1 Mod          Adriana Martin R. Graylon Good, PT, DPT 12/19/22, 2:48 PM

## 2022-12-19 NOTE — Consult Note (Signed)
[place] for Enoxaparin  Indication:  PAD / limb ischemia / A Fib.   Patient Measurements: Height: 5\' 2"  (157.5 cm) Weight: 69 kg (152 lb 1.9 oz) IBW/kg (Calculated) : 50.1 Heparin Dosing Weight: 64.5 kg  Labs: Recent Labs    12/18/22 1038 12/18/22 1550 12/18/22 1658 12/19/22 0058 12/19/22 0618  HGB 11.1*  --   --   --  11.0*  HCT 38.7  --   --   --  36.7  PLT 178  --   --   --  164  APTT  --   --  38* 56*  --   LABPROT  --   --  25.4*  --   --   INR  --   --  2.3*  --   --   HEPARINUNFRC  --   --  >1.10*  --   --   CREATININE 2.38*  --  2.20*  --  2.18*  TROPONINIHS 80* 66*  --   --   --      Estimated Creatinine Clearance: 19.7 mL/min (A) (by C-G formula based on SCr of 2.18 mg/dL (H)).   Medical History: Past Medical History:  Diagnosis Date   Alcohol use    Anemia    Anxiety    Atrial fibrillation ([place])    B12 deficiency    Breast cancer ([place])    RT Lumpectomy c radiation 2012   Breast cancer (HCC)    CHF (congestive heart failure) (HCC)    Chronic diastolic CHF (congestive heart failure) (HCC)    Chronic kidney disease    Cirrhosis (HCC)    CKD (chronic kidney disease), stage III (HCC)    COPD (chronic obstructive pulmonary disease) (HCC)    Dementia (HCC)    Depression    Family history of breast cancer    Family history of esophageal cancer    Family history of multiple myeloma    GERD (gastroesophageal reflux disease)    Headache    Heart murmur    HLD (hyperlipidemia)    HTN (hypertension)    Hyperlipemia    Hyperlipidemia    Hypertension    Hypothyroid    Hypothyroidism    Iron (Fe) deficiency anemia    Monoclonal gammopathy    Pacemaker    Personal history of radiation therapy    Presence of permanent cardiac pacemaker    Shortness of breath dyspnea    Thrombocytopenia (HCC)    Vitamin D deficiency     Medications:  Apixaban 5 mg BID prior to admission  Assessment: 77 y/o F with medical  history as above and including Afib on apixaban who is admitted with acute encephalopathy, UTI, AKI, acute CHF, and PAD (purple feet noted on clinical exam and Doppler pulses only on left). Vascular surgery consulted. Pharmacy consulted to initiate and manage heparin infusion for PAD / limb ischemia on 3/29. Due to issues with patient's IV line pharmacy has been consulted for enoxaparin dosing.   Goal of Therapy:  Monitor platelets by anticoagulation protocol: Yes   Plan: Discontinue heparin infusion.  Start enoxaparin 1mg /kg every 24 hours based on CrCl <27ml/min.  Continue to monitor CBC daily per protocol.   Pernell Dupre, PharmD, BCPS Clinical Pharmacist 12/19/2022 11:24 AM

## 2022-12-19 NOTE — Progress Notes (Signed)
Progress Note   Patient: Adriana Martin [id number] DOB: 1946/06/28 DOA: 12/18/2022     0 DOS: the patient was seen and examined on 12/19/2022   Brief hospital course: 77 y.o. female with medical history significant of HFpEF with last EF of 65-70%, mitral valve replacement, atrial fibrillation on Eliquis, COPD, chronic hypoxic respiratory failure on 2 L, hypertension, CKD stage III, hyperlipidemia, hypothyroidism, breast cancer on tamoxifen, MGUS, frequent UTIs who presents to the ED due to altered mental status.  History obtained through chart review due to patient's altered mental status.   Per chart review, patient presented to the ED from [facility] for abnormal chest x-ray, increasing agitation and disorientation, and possible urinary tract infection. Staff at SNF noted that patient was having some shortness of breath that is why chest x-ray was obtained.  Patient was not on her home oxygen.  Urinalysis at the center demonstrated possible urinary tract infection with elevated WBC in the urine in addition to an AKI with creatinine of 2.6.  Lasix was not administered due to AKI.  They note that patient is bedbound at rest.   Patient is able to tell me her name, but otherwise is unable to answer any other questions.   Collateral history obtained from patient's daughters.  Patient has been sitting more frequently in her own stool and due to this has led to some general irritation.  They are worried that this may have led to the UTI.  In addition, patient has been having some diarrhea.  They were unaware of any shortness of breath.  3/30.  Patient opens her eyes but does not talk much.  Placed on a diet by speech therapy.  Niece at the bedside not sure if they want to go back to rehab or take home.  Has not walked at rehab.  Assessment and Plan: * Acute metabolic encephalopathy Patient lethargic but opens eyes.  Does not talk much.  Continue treatment for infection (possible pneumonia seen on  CT scan, acute cystitis with hematuria)  Acute cystitis with hematuria Klebsiella growing out of urine culture.  Continue Rocephin.  Multifocal pneumonia Seen on CT scan increased dose of Rocephin and add Zithromax.  Acute kidney injury superimposed on CKD ([place]) Acute kidney injury on CKD stage IIIa.  Give gentle IV fluids and monitor closely.  Chronic diastolic CHF (congestive heart failure) (HCC) Elevated BNP, last echocardiogram showed EF normal range.  Watch closely with IV fluid hydration.  PAD (peripheral artery disease) [facility]) Appreciate vascular consultation.  Eliquis switched over to heparin on admission but since the patient having issues with IV access and multiple draws will change over to Lovenox.  If vascular surgery does not want to do any procedures will switch back to Eliquis.  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  Atrial flutter ([place]) Changed to Lovenox injections for anticoagulation and hopefully will get back to Eliquis soon.  Dementia ([place]) - Delirium precautions - Continue home Memantine  Essential hypertension - Hold home Lisinopril in the setting of AKI   COPD (chronic obstructive pulmonary disease) (HCC) No wheezing on examination.   - Continue home bronchodilators.  - Continue home supplemental oxygen at 2L  Breast cancer ([place]) - Continue home Tamoxifen        Subjective: Patient opened her eyes.  Did not talk too much.  Niece in the room did most of the talking.  Admitted with altered mental status.  Placed on heparin drip because vascular  team may consider procedure.  With multiple blood draws I changed over to Lovenox injections.  Being treated for pneumonia and urinary infection.  Placed on a diet today by speech therapy.  Physical Exam: Vitals:   12/18/22 2358 12/19/22 0437 12/19/22 0500 12/19/22 0807  BP: 116/64 (!) 145/64  (!) 135/56  Pulse: 93 66  63  Resp: 19 18  16   Temp:  98.7 F (37.1 C) 97.9 F (36.6 C)  97.9 F (36.6 C)  TempSrc:      SpO2: 98% 100%  96%  Weight:   69 kg   Height:       Physical Exam HENT:     Head: Normocephalic.     Mouth/Throat:     Pharynx: No oropharyngeal exudate.  Eyes:     General: Lids are normal.     Conjunctiva/sclera: Conjunctivae normal.  Cardiovascular:     Rate and Rhythm: Normal rate and regular rhythm.     Heart sounds: Normal heart sounds, S1 normal and S2 normal.  Pulmonary:     Breath sounds: Normal breath sounds. No decreased breath sounds, wheezing, rhonchi or rales.  Abdominal:     Palpations: Abdomen is soft.     Tenderness: There is no abdominal tenderness.  Musculoskeletal:     Right lower leg: No swelling.     Left lower leg: No swelling.  Skin:    General: Skin is warm.     Findings: No rash.     Comments: Bruising bilateral arms and legs.  Neurological:     Mental Status: She is lethargic.     Data Reviewed: Sodium 146, creatinine 2.18, AST 76 ALT 101, lactic acid 2.3, hemoglobin 11.0, white blood cell count 13.6  Family Communication: Spoke with niece at the bedside  Disposition: Status is: Observation Not discharging today.  Family to decide on whether or not to go back to a rehab or home.  Will get PT evaluation.  Continue IV antibiotics.  Vascular team to decide whether they want to do a procedure on Monday or not.  Will get palliative care consultation.  Planned Discharge Destination: To be determined based on family decision.    Time spent: 28 minutes Palliative care consultation.  Author: Loletha Grayer, MD 12/19/2022 2:34 PM  For on call review [email or web address].

## 2022-12-19 NOTE — Consult Note (Signed)
[place] for IV Heparin Indication:  PAD / limb ischemia  Patient Measurements: Height: 5\' 2"  (157.5 cm) Weight: 67.1 kg (147 lb 14.9 oz) IBW/kg (Calculated) : 50.1 Heparin Dosing Weight: 64.5 kg  Labs: Recent Labs    12/18/22 1038 12/18/22 1550 12/18/22 1658 12/19/22 0058  HGB 11.1*  --   --   --   HCT 38.7  --   --   --   PLT 178  --   --   --   APTT  --   --  38* 56*  LABPROT  --   --  25.4*  --   INR  --   --  2.3*  --   HEPARINUNFRC  --   --  >1.10*  --   CREATININE 2.38*  --  2.20*  --   TROPONINIHS 80* 66*  --   --      Estimated Creatinine Clearance: 19.2 mL/min (A) (by C-G formula based on SCr of 2.2 mg/dL (H)).   Medical History: Past Medical History:  Diagnosis Date   Alcohol use    Anemia    Anxiety    Atrial fibrillation ([place])    B12 deficiency    Breast cancer ([place])    RT Lumpectomy c radiation 2012   Breast cancer (HCC)    CHF (congestive heart failure) (HCC)    Chronic diastolic CHF (congestive heart failure) (HCC)    Chronic kidney disease    Cirrhosis (HCC)    CKD (chronic kidney disease), stage III (HCC)    COPD (chronic obstructive pulmonary disease) (HCC)    Dementia ([place])    Depression    Family history of breast cancer    Family history of esophageal cancer    Family history of multiple myeloma    GERD (gastroesophageal reflux disease)    Headache    Heart murmur    HLD (hyperlipidemia)    HTN (hypertension)    Hyperlipemia    Hyperlipidemia    Hypertension    Hypothyroid    Hypothyroidism    Iron (Fe) deficiency anemia    Monoclonal gammopathy    Pacemaker    Personal history of radiation therapy    Presence of permanent cardiac pacemaker    Shortness of breath dyspnea    Thrombocytopenia (HCC)    Vitamin D deficiency     Medications:  Apixaban 5 mg BID prior to admission; medication reconciliation is pending. Un-known last patient reported dose  Assessment: 77 y/o F with  medical history as above and including Afib on apixaban who is admitted with acute encephalopathy, UTI, AKI, acute CHF, and PAD (purple feet noted on clinical exam and Doppler pulses only on left). Vascular surgery consulted. Pharmacy consulted to initiate and manage heparin infusion for PAD / limb ischemia.   Baseline CBC notable for anemia (Hgb 11.1, appears c/w baseline). Baseline aPTT, PT-INR, and heparin level are pending.  3/30: aPTT @ 0058 = 56, SUBtherapeutic   Goal of Therapy:  Heparin level 0.3-0.7 units/ml aPTT 66 - 102 seconds Monitor platelets by anticoagulation protocol: Yes   Plan: 3/30:  aPTT @ 0058 = 56, SUBtherapeutic - Will order heparin 950 units IV X 1 bolus and increase drip rate to 900 units/hr. - Will recheck aPTT and HL 8 hrs after rate change - Will follow aPTT until correlation is established --Daily CBC per protocol while on IV heparin  Adriana Martin D 12/19/2022,1:29 AM

## 2022-12-19 NOTE — Evaluation (Addendum)
Clinical/Bedside Swallow Evaluation Patient Details  Name: Adriana Martin MRN: [id number] Date of Birth: Jun 14, 1946  Today's Date: 12/19/2022 Time: SLP Start Time (ACUTE ONLY): 0805 SLP Stop Time (ACUTE ONLY): [id number] SLP Time Calculation (min) (ACUTE ONLY): 50 min  Past Medical History:  Past Medical History:  Diagnosis Date   Alcohol use    Anemia    Anxiety    Atrial fibrillation ([place])    B12 deficiency    Breast cancer ([place])    RT Lumpectomy c radiation 2012   Breast cancer (HCC)    CHF (congestive heart failure) (HCC)    Chronic diastolic CHF (congestive heart failure) (HCC)    Chronic kidney disease    Cirrhosis (HCC)    CKD (chronic kidney disease), stage III (HCC)    COPD (chronic obstructive pulmonary disease) ([place])    Dementia ([place])    Depression    Family history of breast cancer    Family history of esophageal cancer    Family history of multiple myeloma    GERD (gastroesophageal reflux disease)    Headache    Heart murmur    HLD (hyperlipidemia)    HTN (hypertension)    Hyperlipemia    Hyperlipidemia    Hypertension    Hypothyroid    Hypothyroidism    Iron (Fe) deficiency anemia    Monoclonal gammopathy    Pacemaker    Personal history of radiation therapy    Presence of permanent cardiac pacemaker    Shortness of breath dyspnea    Thrombocytopenia (HCC)    Vitamin D deficiency    Past Surgical History:  Past Surgical History:  Procedure Laterality Date   ABDOMINAL HYSTERECTOMY     Partial   ABLATION     APPENDECTOMY     BICEPT TENODESIS Right 01/07/2018   Procedure: BICEPS TENODESIS;  Surgeon: Leim Fabry, MD;  Location: [facility];  Service: Orthopedics;  Laterality: Right;   BREAST BIOPSY Right 2012   BREAST BIOPSY Right 07/21/2019   venus clip, [place] Bx,positive   BREAST LUMPECTOMY Right 2012   positive/rad   BREAST SURGERY     CARDIAC SURGERY     CARDIAC VALVE REPLACEMENT     COLONOSCOPY  2012   ELECTROPHYSIOLOGIC STUDY N/A 01/23/2015    Procedure: CARDIOVERSION;  Surgeon: Corey Skains, MD;  Location: [facility];  Service: Cardiovascular;  Laterality: N/A;   ELECTROPHYSIOLOGIC STUDY N/A 05/27/2016   Procedure: CARDIOVERSION;  Surgeon: Corey Skains, MD;  Location: [facility];  Service: Cardiovascular;  Laterality: N/A;   ESOPHAGOGASTRODUODENOSCOPY  2012   EYE SURGERY     IR CT HEAD LTD  03/10/2020   IR PERCUTANEOUS ART THROMBECTOMY/INFUSION INTRACRANIAL INC DIAG ANGIO  03/10/2020   IR US GUIDE VASC ACCESS RIGHT  03/10/2020   MASTECTOMY Right    Mastectomy Right    MASTECTOMY WITH AXILLARY LYMPH NODE DISSECTION Right 08/09/2019   Procedure: MASTECTOMY WITH AXILLARY LYMPH NODE DISSECTION;  Surgeon: Robert Bellow, MD;  Location: [facility];  Service: General;  Laterality: Right;   MITRAL VALVE REPLACEMENT     mvp     PACEMAKER PLACEMENT N/A    RADIOLOGY WITH ANESTHESIA N/A 03/10/2020   Procedure: IR WITH ANESTHESIA;  Surgeon: Radiologist, Medication, MD;  Location: [place];  Service: Radiology;  Laterality: N/A;   REVERSE SHOULDER ARTHROPLASTY Right 01/07/2018   Procedure: REVERSE SHOULDER ARTHROPLASTY;  Surgeon: Leim Fabry, MD;  Location: [facility];  Service: Orthopedics;  Laterality: Right;   VSD REPAIR  VSD REPAIR N/A    HPI:  Pt  is a 77 y.o. female with medical history significant of Multiple medical issues including Dementia, hypertension, HLD, COPD on 2 L oxygen, hypothyroidism, depression, L MCA CVA, anxiety, pacemaker placement, primary biliary cirrhosis, breast cancer (s/p of right mastectomy and radiation therapy), dementia, dCHF, atrial fibrillation on Eliquis, CKD stage IIIb, who presents from [facility] admitted with concerns of elevated white count, UTI. Per her family member at the bedside, patient was recently hospitalized at [facility] at 10/2022, then here at [facility] in 11/2022.  CXR: Enlarged heart with prosthetic aortic valve. Calcified aorta.  Pacemaker. Vascular congestion and mild edema. Cardiomegaly.  Per  chart/report, pt has had Multiple recent hospitalizations.  Head CT: Head CT: Moderate to Advanced generalized cerebral atrophy. Mild cerebellar atrophy.      Assessment / Plan / Recommendation  Clinical Impression   Pt seen for BSE today. Pt awake, paucity of speech to direct questions. She exhibits some decreased awareness but more engagement during po tasks and direct verbal communication w/ cues. She responded to verbal/tactile cues. Pt has a baseline of Dementia.  On [place] O2 2-3L; afebrile. WBC trending down. Noted CXR indicating vascular congestion.  OF NOTE: Pt was just recently discharged on 12/04/2022 back to SNF post brief admit. She was seen by ST services and recommended a Dysphagia level 1(Puree) diet w/ thin liquids then. Concern of need for feeding and overall oral intake was noted then. Pt required much support at meals.   Pt appears to present w/ functional oropharyngeal phase swallowing w/ a modified diet consistency (Puree foods) in setting of declined Cognitive status, Edentulous status. Pt has a Baseline dx of Dementia. 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  ANY Cognitive decline can impact overall awareness/timing of swallow, the Oral phase, and safety during po tasks which increases risk for aspiration, choking. Pt is also Edentulous w/ no Bottom Dentition; not wearing Top Denture. She required min-mod verbal/visual/tactile cues for follow through during po tasks and attempts to hold cup to feed self during drinking(which decreases risk for aspiration).    Pt appears at reduced risk for aspiration/aspiration pneumonia when following general aspiration precautions, using a modified diet consistency, and when given feeding support at meals; Supervision.    Pt consumed trials of ice chips, thin  liquids via straw/cup and Purees w/ No overt, clinical s/s of aspiration noted. No overt coughing or decline in respiratory status/vocal quality b/t trials; O2 sats remained 96%. F/u lingual sweeping movements and a dry swallow were noted intermittently w/ trials given. Oral phase management of boluses was appropriate w/ trials given; no overt oral prep issues nor oral holding of boluses noted; timely A-P transfer/swallow/clearing. Min verbal cues to redirect attention to po boluses/feeding given intermittently.            In setting of chronic/current presentation as above and her Baseline Cognitive decline, recommend continue a dysphagia level 1 diet (puree) w/ thin liquids as recently recommended w/ careful monitoring during meals w/ straw drinking and feeding of foods. Sips of thin liquids(small, slowly). Recommend feeding assistance and cues for follow through during po tasks; reduce distractions during meals. 100% Supervision at meals. Recommend general aspiration precautions. Pills in Puree - Crushed as able.  This diet consistency will be beneficial for pt's overall safe, oral intake for  nutrition. Suspect pt could be at/close to her swallowing baseline in setting of her Dementia. MD/NSG updated.  ST services recommends follow w/ Palliative Care for [place] and education re: impact of Cognitive decline/Dementia on swallowing. Precautions posted in room. No further acute ST needs at this time. TOC updated also. SLP Visit Diagnosis: Dysphagia, oral phase (R13.11) (baseline Cognitive decline; edentulous; weak)    Aspiration Risk  Mild aspiration risk;Risk for inadequate nutrition/hydration (reduced following precautions)    Diet Recommendation   a dysphagia level 1 diet (puree) w/ thin liquids as recently recommended w/ careful monitoring during meals w/ straw drinking and feeding of foods. Sips of thin liquids(small, slowly). Recommend feeding assistance and cues for follow through during po tasks;  reduce distractions during meals. 100% Supervision at meals. Recommend general aspiration precautions.  Medication Administration: Crushed with puree (as needed)    Other  Recommendations Recommended Consults:  (Palliative Care consult for [place]; Dietician f/u) Oral Care Recommendations: Oral care BID;Oral care before and after PO;Staff/trained caregiver to provide oral care    Recommendations for follow up therapy are one component of a multi-disciplinary discharge planning process, led by the attending physician.  Recommendations may be updated based on patient status, additional functional criteria and insurance authorization.  Follow up Recommendations Follow physician's recommendations for discharge plan and follow up therapies (at next venue of care)      Assistance Recommended at Discharge  full  Functional Status Assessment Patient has not had a recent decline in their functional status  Frequency and Duration  (n/a)   (n/a)       Prognosis Prognosis for improved oropharyngeal function: Fair Barriers to Reach Goals: Cognitive deficits;Language deficits;Time post onset;Severity of deficits;Behavior Barriers/Prognosis Comment: baseline Dementia      Swallow Study   General Date of Onset: 12/18/22 HPI: Pt  is a 77 y.o. female with medical history significant of Multiple medical issues including Dementia, hypertension, HLD, COPD on 2 L oxygen, hypothyroidism, depression, L MCA CVA, anxiety, pacemaker placement, primary biliary cirrhosis, breast cancer (s/p of right mastectomy and radiation therapy), dementia, dCHF, atrial fibrillation on Eliquis, CKD stage IIIb, who presents from [facility] admitted with concerns of elevated white count, UTI. Per her family member at the bedside, patient was recently hospitalized at [facility] at 10/2022, then here at [facility] in 11/2022. CXR: Enlarged heart with prosthetic aortic valve. Calcified aorta.  Pacemaker. Vascular congestion and mild edema.  Cardiomegaly.  Per chart/report, pt has had Multiple recent hospitalizations. Type of Study: Bedside Swallow Evaluation Previous Swallow Assessment: 11/2022 Diet Prior to this Study: Dysphagia 1 (pureed);Thin liquids (Level 0) (when she d/c'd on 12/04/2022 d/t Cognitive decline.) Temperature Spikes Noted: No (wbc trending down, 13.6) Respiratory Status: Nasal cannula (2-3L) History of Recent Intubation: No Behavior/Cognition: Alert;Cooperative;Pleasant mood;Confused;Distractible;Requires cueing (Dementia) Oral Cavity Assessment: Within Functional Limits Oral Care Completed by SLP: Yes Oral Cavity - Dentition: Edentulous (not wearing denture plate) Vision: Functional for self-feeding (helps to hold cup for drinking) Self-Feeding Abilities: Able to feed self;Needs assist;Needs set up;Total assist (helps to hold cup for drinking) Patient Positioning: Upright in bed (needed full positioning) Baseline Vocal Quality: Low vocal intensity (intelligible) Volitional Cough: Cognitively unable to elicit Volitional Swallow: Unable to elicit    Oral/Motor/Sensory Function Overall Oral Motor/Sensory Function: Within functional limits   Ice Chips Ice chips: Within functional limits Presentation: Spoon (fed; 3 trials)   Thin Liquid Thin Liquid: Within functional limits Presentation: Cup;Self Fed;Straw (pinched straw when needed for smaller sips; ~4-5 ozs total)  Other Comments: water, juice    Nectar Thick Nectar Thick Liquid: Not tested   Honey Thick Honey Thick Liquid: Not tested   Puree Puree: Within functional limits Presentation: Spoon (fed; 12 trials) Oral Phase Impairments:  (n/a) Pharyngeal Phase Impairments:  (n/a)   Solid     Solid: Not tested Other Comments: baseline presentation; acute illness; Dementia         Orinda Kenner, MS, CCC-SLP Speech Language Pathologist Rehab Services; [place] [phone] (ascom) Adriana Martin 12/19/2022,1:29 PM

## 2022-12-19 NOTE — Hospital Course (Signed)
77 y.o. female with medical history significant of HFpEF with last EF of 65-70%, mitral valve replacement, atrial fibrillation on Eliquis, COPD, chronic hypoxic respiratory failure on 2 L, hypertension, CKD stage III, hyperlipidemia, hypothyroidism, breast cancer on tamoxifen, MGUS, frequent UTIs who presents to the ED due to altered mental status.  History obtained through chart review due to patient's altered mental status.   Per chart review, patient presented [facility] from [facility] for abnormal chest x-ray, increasing agitation and disorientation, and possible urinary tract infection. Staff at SNF noted that patient was having some shortness of breath that is why chest x-ray was obtained.  Patient was not on her home oxygen.  Urinalysis at the center demonstrated possible urinary tract infection with elevated WBC in the urine in addition to an AKI with creatinine of 2.6.  Lasix was not administered due to AKI.  They note that patient is bedbound at rest.   Patient is able to tell me her name, but otherwise is unable to answer any other questions.   Collateral history obtained from patient's daughters.  Patient has been sitting more frequently in her own stool and due to this has led to some general irritation.  They are worried that this may have led to the UTI.  In addition, patient has been having some diarrhea.  They were unaware of any shortness of breath.  3/30.  Patient opens her eyes but does not talk much.  Placed on a diet by speech therapy.  Niece at the bedside not sure if they want to go back to rehab or take home.  Has not walked at rehab. 3/31.  Patient more alert today following commands and more talkative today.  Resistance profile on the Klebsiella in the urine culture.  Has ESBL.  Discontinue Rocephin and start meropenem.  Likely will switch over to fosfomycin upon disposition. 4/1.  IV fluids discontinued and a dose of IV Lasix given for patient's shortness of breath.  Chest x-ray  read as rightly improved pulmonary edema. 4/2.  Creatinine down to 1.48.  Continue meropenem today.

## 2022-12-19 NOTE — Assessment & Plan Note (Addendum)
Mental status better than on admission..  Continue treatment for infection (possible pneumonia seen on CT scan, acute cystitis with hematuria).  Changed antibiotics on 3/31 secondary to ESBL Klebsiella in urine culture.

## 2022-12-19 NOTE — Assessment & Plan Note (Signed)
Seen on CT scan.  With ESBL Klebsiella growing out of urine culture switch Rocephin over to meropenem.  Continue Zithromax.

## 2022-12-19 NOTE — Evaluation (Signed)
Occupational Therapy Evaluation Patient Details Name: Adriana Martin MRN: [id number] DOB: 1946/04/30 Today's Date: 12/19/2022   History of Present Illness Adriana Martin is a 77 y.o. female with medical history significant of HFpEF with last EF of 65-70%, mitral valve replacement, atrial fibrillation on Eliquis, COPD, chronic hypoxic respiratory failure on 2 L, hypertension, CKD stage III, hyperlipidemia, hypothyroidism, breast cancer on tamoxifen, MGUS, frequent UTIs who presents to the ED due to altered mental status. She was admitted from [facility], for treatment for acute encephalopathy, UTI, AKI, acute on chronic diastolic CHF, PAD, primary biliary cholangitis, atrial flutter, dementia, HTN, COPD, breast cancer. She uses 2L/min O2 at baseline.   Clinical Impression   Adriana Martin was seen for OT evaluation this date. Pt recently at [facility] requiring assist for all ADLs and mobility, prior to which pt was ambulatory with RW. Pt currently requires MAX A hand over hand for self-feeding, pt demonstrates adequate strength/ROM to complete wiht SETUP only, limited by cognition. TOTAL A sup<>sit, pt agitated upon sitting and returns to bed. Pt would benefit from trial of OT while at hospital. Upon hospital discharge, recommend continued therapy pending ability to participate. Recommend +2 assist for all transfers and hospital bed.   Recommendations for follow up therapy are one component of a multi-disciplinary discharge planning process, led by the attending physician.  Recommendations may be updated based on patient status, additional functional criteria and insurance authorization.   Assistance Recommended at Discharge Frequent or constant Supervision/Assistance  Patient can return home with the following A lot of help with bathing/dressing/bathroom;Two people to help with walking and/or transfers    Functional Status Assessment  Patient has had a recent decline in their functional status and  demonstrates the ability to make significant improvements in function in a reasonable and predictable amount of time.  Equipment Recommendations  Hospital bed    Recommendations for Other Services       Precautions / Restrictions Precautions Precautions: Fall Restrictions Weight Bearing Restrictions: No      Mobility Bed Mobility Overal bed mobility: Needs Assistance Bed Mobility: Supine to Sit, Sit to Supine     Supine to sit: Total assist Sit to supine: Total assist        Transfers                   General transfer comment: pt deferred      Balance Overall balance assessment: Needs assistance Sitting-balance support: No upper extremity supported, Feet supported Sitting balance-Leahy Scale: Poor                                     ADL either performed or assessed with clinical judgement   ADL Overall ADL's : Needs assistance/impaired                                       General ADL Comments: MAX A hand over hand for self-feeding, pt demonstrates adequate strength/ROM to complete wiht SETUP only, limited by cognition      Pertinent Vitals/Pain Pain Assessment Pain Assessment: Faces Faces Pain Scale: Hurts a little bit Pain Location: pain to light touch on all extremities - may be surprise vs pain Pain Descriptors / Indicators: Grimacing Pain Intervention(s): Limited activity within patient's tolerance, Repositioned     Hand Dominance Right  Extremity/Trunk Assessment Upper Extremity Assessment Upper Extremity Assessment: Generalized weakness   Lower Extremity Assessment Lower Extremity Assessment: Generalized weakness   Cervical / Trunk Assessment Cervical / Trunk Assessment: Normal   Communication Communication Communication: HOH   Cognition Arousal/Alertness: Awake/alert Behavior During Therapy: Flat affect Overall Cognitive Status: History of cognitive impairments - at baseline                                  General Comments: hx of dementia     General Comments  unable to get reading on SpO2            Home Living Family/patient expects to be discharged to:: Private residence Living Arrangements: Children;Non-relatives/Friends Available Help at Discharge: Family;Personal care attendant;Available 24 hours/day Type of Home: House Home Access: Level entry     Home Layout: One level     Bathroom Shower/Tub: Occupational psychologist: Standard Bathroom Accessibility: Yes   Home Equipment: Conservation officer, nature (2 wheels);BSC/3in1;Shower seat;Toilet riser;Grab bars - tub/shower;Wheelchair - manual   Additional Comments: History obtained from daughter Adriana Martin as patient is unable to provide history per cognitive limitations. Patient has been in [facility] for past month      Prior Functioning/Environment Prior Level of Function : Needs assist             Mobility Comments: History from [name] as patient unable to provide. Prior to getting COVID19 and being hospitalized and in SNF care for the last month, patient ambulated in the home with RW with supervision. She has had limited mobility in the last month at facility ADLs Comments: assist for all ADLs        OT Problem List: Decreased strength;Decreased activity tolerance;Impaired balance (sitting and/or standing);Decreased safety awareness;Decreased cognition;Decreased knowledge of use of DME or AE;Decreased knowledge of precautions      OT Treatment/Interventions: Self-care/ADL training;Therapeutic exercise;Balance training;Therapeutic activities;DME and/or AE instruction;Cognitive remediation/compensation;Patient/family education;Manual therapy    OT Goals(Current goals can be found in the care plan section) Acute Rehab OT Goals Patient Stated Goal: to go home OT Goal Formulation: With family Time For Goal Achievement: 01/02/23 Potential to Achieve Goals: Fair ADL Goals Pt Will Perform  Eating: with supervision;with set-up;bed level Pt Will Perform Grooming: sitting;with mod assist Pt Will Transfer to Toilet: with max assist;bedside commode  OT Frequency: Min 2X/week    Co-evaluation              AM-PAC OT "6 Clicks" Daily Activity     Outcome Measure Help from another person eating meals?: A Lot Help from another person taking care of personal grooming?: A Lot Help from another person toileting, which includes using toliet, bedpan, or urinal?: A Lot Help from another person bathing (including washing, rinsing, drying)?: A Lot Help from another person to put on and taking off regular upper body clothing?: A Lot Help from another person to put on and taking off regular lower body clothing?: A Lot 6 Click Score: 12   End of Session    Activity Tolerance: Patient tolerated treatment well Patient left: in bed;with call bell/phone within reach;with family/visitor present  OT Visit Diagnosis: Unsteadiness on feet (R26.81);Other abnormalities of gait and mobility (R26.89);Muscle weakness (generalized) (M62.81)                Time: [id number] OT Time Calculation (min): 14 min Charges:  OT General Charges $OT Visit: 1 Visit OT Evaluation $OT Eval  Low Complexity: 1 Low  Dessie Coma, M.S. OTR/L  12/19/22, 2:50 PM  ascom [phone]

## 2022-12-19 NOTE — Assessment & Plan Note (Signed)
Acute kidney injury on CKD stage IIIa.  Give gentle IV fluids and monitor closely.

## 2022-12-20 DIAGNOSIS — J189 Pneumonia, unspecified organism: Secondary | ICD-10-CM | POA: Diagnosis not present

## 2022-12-20 DIAGNOSIS — Z1612 Extended spectrum beta lactamase (ESBL) resistance: Secondary | ICD-10-CM

## 2022-12-20 DIAGNOSIS — I4892 Unspecified atrial flutter: Secondary | ICD-10-CM

## 2022-12-20 DIAGNOSIS — N179 Acute kidney failure, unspecified: Secondary | ICD-10-CM | POA: Diagnosis not present

## 2022-12-20 DIAGNOSIS — N3001 Acute cystitis with hematuria: Secondary | ICD-10-CM | POA: Diagnosis not present

## 2022-12-20 DIAGNOSIS — A499 Bacterial infection, unspecified: Secondary | ICD-10-CM

## 2022-12-20 DIAGNOSIS — G9341 Metabolic encephalopathy: Secondary | ICD-10-CM | POA: Diagnosis not present

## 2022-12-20 LAB — CBC
HCT: 37.2 % (ref 36.0–46.0)
Hemoglobin: 10.9 g/dL — ABNORMAL LOW (ref 12.0–15.0)
MCH: 26.7 pg (ref 26.0–34.0)
MCHC: 29.3 g/dL — ABNORMAL LOW (ref 30.0–36.0)
MCV: 91 fL (ref 80.0–100.0)
Platelets: 132 10*3/uL — ABNORMAL LOW (ref 150–400)
RBC: 4.09 MIL/uL (ref 3.87–5.11)
RDW: 17.1 % — ABNORMAL HIGH (ref 11.5–15.5)
WBC: 15.1 10*3/uL — ABNORMAL HIGH (ref 4.0–10.5)
nRBC: 1.3 % — ABNORMAL HIGH (ref 0.0–0.2)

## 2022-12-20 LAB — BASIC METABOLIC PANEL
Anion gap: 11 (ref 5–15)
BUN: 47 mg/dL — ABNORMAL HIGH (ref 8–23)
CO2: 26 mmol/L (ref 22–32)
Calcium: 8.6 mg/dL — ABNORMAL LOW (ref 8.9–10.3)
Chloride: 107 mmol/L (ref 98–111)
Creatinine, Ser: 1.95 mg/dL — ABNORMAL HIGH (ref 0.44–1.00)
GFR, Estimated: 26 mL/min — ABNORMAL LOW (ref >60)
Glucose, Bld: 116 mg/dL — ABNORMAL HIGH (ref 70–99)
Potassium: 4 mmol/L (ref 3.5–5.1)
Sodium: 144 mmol/L (ref 135–145)

## 2022-12-20 LAB — URINE CULTURE: Culture: >=100000 — AB

## 2022-12-20 MED ORDER — SODIUM CHLORIDE 0.9 % IV SOLN
1.0000 g | Freq: Two times a day (BID) | INTRAVENOUS | Status: DC
  Administered 2022-12-20 – 2022-12-22 (×5): 1 g via INTRAVENOUS
  Filled 2022-12-20: qty 1
  Filled 2022-12-20: qty 20
  Filled 2022-12-20: qty 1
  Filled 2022-12-20 (×2): qty 20
  Filled 2022-12-20: qty 1
  Filled 2022-12-20 (×3): qty 20

## 2022-12-20 NOTE — Progress Notes (Signed)
Progress Note   Patient: Adriana Martin [id number] DOB: [birth year] DOA: 12/18/2022     1 DOS: the patient was seen and examined on 12/20/2022   Brief hospital course: 77 y.o. female with medical history significant of HFpEF with last EF of 65-70%, mitral valve replacement, atrial fibrillation on Eliquis, COPD, chronic hypoxic respiratory failure on 2 L, hypertension, CKD stage III, hyperlipidemia, hypothyroidism, breast cancer on tamoxifen, MGUS, frequent UTIs who presents to the ED due to altered mental status.  History obtained through chart review due to patient's altered mental status.   Per chart review, [facility], increasing agitation and disorientation, and possible urinary tract infection. Staff at SNF noted that patient was having some shortness of breath that is why chest x-ray was obtained.  Patient was not on her home oxygen.  Urinalysis at the center demonstrated possible urinary tract infection with elevated WBC in the urine in addition to an AKI with creatinine of 2.6.  Lasix was not administered due to AKI.  They note that patient is bedbound at rest.   Patient is able to tell me her name, but otherwise is unable to answer any other questions.   Collateral history obtained from patient's daughters.  Patient has been sitting more frequently in her own stool and due to this has led to some general irritation.  They are worried that this may have led to the UTI.  In addition, patient has been having some diarrhea.  They were unaware of any shortness of breath.  3/30.  Patient opens her eyes but does not talk much.  Placed on a diet by speech therapy.  Niece at the bedside not sure if they want to go back to rehab or take home.  Has not walked at rehab. 3/31.  Patient more alert today following commands and more talkative today.  Resistance profile on the Klebsiella in the urine culture.  Has ESBL.  Discontinue Rocephin and  start meropenem.  Likely will switch over to fosfomycin upon disposition.  Assessment and Plan: * Acute metabolic encephalopathy Mental status better today than yesterday.  Continue treatment for infection (possible pneumonia seen on CT scan, acute cystitis with hematuria).  Changing antibiotics today with resistance profile in urine.  Acute cystitis with hematuria ESBL klebsiella growing out of urine culture.  Switch antibiotic over to meropenem.  Likely will use fosfomycin upon discharge.  Multifocal pneumonia Seen on CT scan.  With ESBL Klebsiella growing out of urine culture switch Rocephin over to meropenem.  Continue Zithromax.  Acute kidney injury superimposed on CKD ([place]) Acute kidney injury on CKD stage IIIa.  Give gentle IV fluids and monitor closely.  Labs ordered today but not drawn yet.  Chronic diastolic CHF (congestive heart failure) (HCC) Elevated BNP, last echocardiogram showed EF normal range.  Watch closely with IV fluid hydration.  PAD (peripheral artery disease) [facility]) Appreciate vascular consultation.  Currently on Lovenox injections.  If vascular surgery does not want to do any procedures will switch back to Eliquis.  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  Atrial flutter ([place]) Changed to Lovenox injections for anticoagulation and hopefully will get back to Eliquis soon.  Dementia ([place]) - Delirium precautions - Continue home Memantine  Essential hypertension - Hold home Lisinopril in the setting of AKI   COPD (chronic obstructive pulmonary disease) (HCC) No wheezing on examination.   - Continue home bronchodilators.  - Continue  home supplemental oxygen at 2L  Breast cancer ([place]) - Continue home Tamoxifen        Subjective: Patient more alert today.  Answers questions.  Able to straight leg raise.  Offers no complaints.  Admitted with altered mental status.  Found to have acute cystitis.  Just  got back ESBL Klebsiella and urine culture.  Physical Exam: Vitals:   12/20/22 0442 12/20/22 0500 12/20/22 0700 12/20/22 0852  BP: (!) 140/67   (!) 129/51  Pulse: 62   (!) 59  Resp: 18   16  Temp: (!) 97.4 F (36.3 C)   98 F (36.7 C)  TempSrc: Oral     SpO2: 98%  90% 100%  Weight:  70.1 kg    Height:       Physical Exam HENT:     Head: Normocephalic.     Mouth/Throat:     Pharynx: No oropharyngeal exudate.  Eyes:     General: Lids are normal.     Conjunctiva/sclera: Conjunctivae normal.  Cardiovascular:     Rate and Rhythm: Normal rate and regular rhythm.     Heart sounds: Normal heart sounds, S1 normal and S2 normal.  Pulmonary:     Breath sounds: Normal breath sounds. No decreased breath sounds, wheezing, rhonchi or rales.  Abdominal:     Palpations: Abdomen is soft.     Tenderness: There is no abdominal tenderness.  Musculoskeletal:     Right lower leg: No swelling.     Left lower leg: No swelling.  Skin:    General: Skin is warm.     Findings: No rash.     Comments: Bruising bilateral arms and legs.  Neurological:     Mental Status: She is alert.     Comments: Able to straight leg raise.     Data Reviewed: Today's labs still pending.  Family Communication: Left message for patient's daughter  Disposition: Status is: Inpatient Remains inpatient appropriate because: Changing antibiotics with resistance and urine culture.  Started on meropenem.  Likely fosfomycin once ready to discharge.  Planned Discharge Destination: Home with home health versus rehab.    Time spent: 28 minutes  Author: Loletha Grayer, MD 12/20/2022 11:14 AM  For on call review [email or web address].

## 2022-12-20 NOTE — Progress Notes (Signed)
Pharmacy Antibiotic Note  Adriana Martin is a 77 y.o. female admitted on 12/18/2022 with Acute cystitis with hematuria growing ESBL Klebsiella pneumoniae.  Pharmacy has been consulted for meropenem dosing.  Plan: start meropenem 1 gram IV every 12 hours ---follow renal function for needed dose adjustments  Height: 5\' 2"  (157.5 cm) Weight: 70.1 kg (154 lb 8.7 oz) IBW/kg (Calculated) : 50.1  Temp (24hrs), Avg:97.7 F (36.5 C), Min:97.4 F (36.3 C), Max:98 F (36.7 C)  Recent Labs  Lab 12/18/22 1038 12/18/22 1550 12/18/22 1658 12/19/22 0618  WBC 14.4*  --   --  13.6*  CREATININE 2.38*  --  2.20* 2.18*  LATICACIDVEN  --  2.3*  --   --     Estimated Creatinine Clearance: 19.8 mL/min (A) (by C-G formula based on SCr of 2.18 mg/dL (H)).    Allergies  Allergen Reactions   Atorvastatin Shortness Of Breath    Tolerates pravastatin   Calcium Shortness Of Breath   Fluoxetine     Other reaction(s): Unknown   Hydrochlorothiazide     Other reaction(s): Unknown    Antimicrobials this admission: 03/29 ceftriaxone >> 03/31 03/30 azithromycin >> (04/03) 03/31 meropenem >>   Microbiology results: 03/29 UCx: ESBL Klebsiella pneumoniae   Thank you for allowing pharmacy to be a part of this patient's care.  Dallie Piles 12/20/2022 11:08 AM

## 2022-12-20 NOTE — Progress Notes (Signed)
No significant adverse events over the last 24 hours.  Has tried to participate in PT and OT.  No specific complaints voiced regarding her feet.  On physical examination today, toes are less violaceous, plantar tip of the left fourth toe remains somewhat cyanotic.  The rest of her foot appears warm and well-perfused bilaterally.  Will order ABI for tomorrow.  Will continue Lovenox in the interim, but likely back to Eliquis tomorrow if no procedures warranted.

## 2022-12-21 ENCOUNTER — Inpatient Hospital Stay: Payer: Medicare Other

## 2022-12-21 DIAGNOSIS — G9341 Metabolic encephalopathy: Secondary | ICD-10-CM | POA: Diagnosis not present

## 2022-12-21 DIAGNOSIS — N3001 Acute cystitis with hematuria: Secondary | ICD-10-CM | POA: Diagnosis not present

## 2022-12-21 DIAGNOSIS — N179 Acute kidney failure, unspecified: Secondary | ICD-10-CM | POA: Diagnosis not present

## 2022-12-21 DIAGNOSIS — J189 Pneumonia, unspecified organism: Secondary | ICD-10-CM | POA: Diagnosis not present

## 2022-12-21 LAB — CBC
HCT: 36.9 % (ref 36.0–46.0)
Hemoglobin: 10.9 g/dL — ABNORMAL LOW (ref 12.0–15.0)
MCH: 26.8 pg (ref 26.0–34.0)
MCHC: 29.5 g/dL — ABNORMAL LOW (ref 30.0–36.0)
MCV: 90.9 fL (ref 80.0–100.0)
Platelets: 131 10*3/uL — ABNORMAL LOW (ref 150–400)
RBC: 4.06 MIL/uL (ref 3.87–5.11)
RDW: 17 % — ABNORMAL HIGH (ref 11.5–15.5)
WBC: 13.1 10*3/uL — ABNORMAL HIGH (ref 4.0–10.5)
nRBC: 1.5 % — ABNORMAL HIGH (ref 0.0–0.2)

## 2022-12-21 LAB — CREATININE, SERUM
Creatinine, Ser: 1.69 mg/dL — ABNORMAL HIGH (ref 0.44–1.00)
GFR, Estimated: 31 mL/min — ABNORMAL LOW (ref >60)

## 2022-12-21 MED ORDER — APIXABAN 5 MG PO TABS
5.0000 mg | ORAL_TABLET | Freq: Two times a day (BID) | ORAL | Status: DC
  Administered 2022-12-21 – 2022-12-23 (×6): 5 mg via ORAL
  Filled 2022-12-21 (×7): qty 1

## 2022-12-21 MED ORDER — FUROSEMIDE 10 MG/ML IJ SOLN
40.0000 mg | Freq: Once | INTRAMUSCULAR | Status: AC
  Administered 2022-12-21: 40 mg via INTRAVENOUS
  Filled 2022-12-21: qty 4

## 2022-12-21 NOTE — Progress Notes (Signed)
Progress Note   Patient: Adriana Martin [id number] DOB: Dec 17, 1945 DOA: 12/18/2022     2 DOS: the patient was seen and examined on 12/21/2022   Brief hospital course: 77 y.o. female with medical history significant of HFpEF with last EF of 65-70%, mitral valve replacement, atrial fibrillation on Eliquis, COPD, chronic hypoxic respiratory failure on 2 L, hypertension, CKD stage III, hyperlipidemia, hypothyroidism, breast cancer on tamoxifen, MGUS, frequent UTIs who presents to the ED due to altered mental status.  History obtained through chart review due to patient's altered mental status.   Per chart review, [facility], increasing agitation and disorientation, and possible urinary tract infection. Staff at SNF noted that patient was having some shortness of breath that is why chest x-ray was obtained.  Patient was not on her home oxygen.  Urinalysis at the center demonstrated possible urinary tract infection with elevated WBC in the urine in addition to an AKI with creatinine of 2.6.  Lasix was not administered due to AKI.  They note that patient is bedbound at rest.   Patient is able to tell me her name, but otherwise is unable to answer any other questions.   Collateral history obtained from patient's daughters.  Patient has been sitting more frequently in her own stool and due to this has led to some general irritation.  They are worried that this may have led to the UTI.  In addition, patient has been having some diarrhea.  They were unaware of any shortness of breath.  3/30.  Patient opens her eyes but does not talk much.  Placed on a diet by speech therapy.  Niece at the bedside not sure if they want to go back to rehab or take home.  Has not walked at rehab. 3/31.  Patient more alert today following commands and more talkative today.  Resistance profile on the Klebsiella in the urine culture.  Has ESBL.  Discontinue Rocephin and  start meropenem.  Likely will switch over to fosfomycin upon disposition.  Assessment and Plan: * Acute metabolic encephalopathy Mental status better than on admission..  Continue treatment for infection (possible pneumonia seen on CT scan, acute cystitis with hematuria).  Changed antibiotics on 3/31 secondary to ESBL Klebsiella in urine culture.  Acute cystitis with hematuria ESBL klebsiella growing out of urine culture.  Switch antibiotic over to meropenem.  Likely will use fosfomycin upon discharge.  Multifocal pneumonia Seen on CT scan.  With ESBL Klebsiella growing out of urine culture switch Rocephin over to meropenem.  Continue Zithromax.  Acute kidney injury superimposed on CKD Acute kidney injury on CKD stage IIIa.  Creatinine 2.38 on presentation and down to 1.69.  Chronic diastolic CHF (congestive heart failure) Elevated BNP, last echocardiogram showed EF normal range.  Stop IV fluid today and give 1 dose of IV Lasix.  Reassess tomorrow.  PAD (peripheral artery disease) ABI came back okay.  No intervention.  Discontinue Lovenox and placed back on Eliquis.  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  Atrial flutter ([place]) Placed back on Eliquis.  Dementia - Delirium precautions - Continue home Memantine  Essential hypertension - Hold home Lisinopril in the setting of AKI   COPD (chronic obstructive pulmonary disease) No wheezing on examination.   - Continue home bronchodilators.  - Continue home supplemental oxygen at 2L  Breast cancer - Continue home Tamoxifen        Subjective: Patient  is a little short of breath today and a little cough.  I stopped IV fluids.  Admitted with acute metabolic encephalopathy.  Antibiotics changed yesterday for ESBL Klebsiella in the urine culture.  Physical Exam: Vitals:   12/20/22 1726 12/20/22 2104 12/21/22 0611 12/21/22 0735  BP: (!) 141/87 (!) 126/56 138/79 (!) 146/84   Pulse: 60 63 60 60  Resp: 19 19 19 18   Temp:  98.2 F (36.8 C) 97.8 F (36.6 C) (!) 97.5 F (36.4 C)  TempSrc:      SpO2: 99% 100% 97% 100%  Weight:      Height:       Physical Exam HENT:     Head: Normocephalic.     Mouth/Throat:     Pharynx: No oropharyngeal exudate.  Eyes:     General: Lids are normal.     Conjunctiva/sclera: Conjunctivae normal.  Cardiovascular:     Rate and Rhythm: Normal rate and regular rhythm.     Heart sounds: Normal heart sounds, S1 normal and S2 normal.  Pulmonary:     Breath sounds: Normal breath sounds. No decreased breath sounds, wheezing, rhonchi or rales.  Abdominal:     Palpations: Abdomen is soft.     Tenderness: There is no abdominal tenderness.  Musculoskeletal:     Right lower leg: No swelling.     Left lower leg: No swelling.  Skin:    General: Skin is warm.     Findings: No rash.     Comments: Bruising bilateral arms and legs.  Neurological:     Mental Status: She is alert.     Comments: Able to straight leg raise.     Data Reviewed: Creatinine 1.69  Family Communication: Updated patient's daughter on the phone  Disposition: Status is: Inpatient Remains inpatient appropriate because: Switched to meropenem yesterday would like to do a few days of this prior to switching over to fosfomycin.  Planned Discharge Destination: Home with Home Health    Time spent: 27 minutes  Author: Loletha Grayer, MD 12/21/2022 12:40 PM  For on call review [email or web address].

## 2022-12-21 NOTE — TOC Initial Note (Addendum)
Transition of Care [facility]) - Initial/Assessment Note    Patient Details  Name: Adriana Martin MRN: [id number] Date of Birth: December 04, 1945  [facility]) CM/SW Contact:    Gerilyn Pilgrim, LCSW Phone Number: 12/21/2022, 3:07 PM  Clinical Narrative:   CSW spoke with patients daughter [name] who states that they are planning to take the patient home. Pt on chronic oxygen at home.   [name] reports she would like a hospital bed and a hoyer lift ordered for home. Pt does have a RW and BSC at home.Daughter requesting [facility] and reports no preference. [facility] has accepted for PT/OT/RN/speech/aid. Daughter reports patient has someone who comes during the day that is able to assist with her mom but states she is interested in additional assistance. Referral made to [facility] with [facility].                      Patient Goals and CMS Choice            Expected Discharge Plan and Services                                              Prior Living Arrangements/Services                       Activities of Daily Living Home Assistive Devices/Equipment: Wheelchair ADL Screening (condition at time of admission) Patient's cognitive ability adequate to safely complete daily activities?: No Is the patient deaf or have difficulty hearing?: No Does the patient have difficulty seeing, even when wearing glasses/contacts?: No Does the patient have difficulty concentrating, remembering, or making decisions?: Yes Patient able to express need for assistance with ADLs?: No Does the patient have difficulty dressing or bathing?: Yes Independently performs ADLs?: No Does the patient have difficulty walking or climbing stairs?: Yes Weakness of Legs: Both Weakness of Arms/Hands: Both  Permission Sought/Granted                  Emotional Assessment              Admission diagnosis:  Confusion [R41.0] Acute pulmonary edema [J81.0] Acute  encephalopathy [G93.40] AKI (acute kidney injury) [N17.9] Patient Active Problem List   Diagnosis Date Noted   ESBL (extended spectrum beta-lactamase) producing bacteria infection 99991111   Acute metabolic encephalopathy [id number]   Acute cystitis with hematuria 12/19/2022   Multifocal pneumonia 12/19/2022   Acute kidney injury superimposed on CKD 12/19/2022   PAD (peripheral artery disease) 12/18/2022   Myocardial injury 11/30/2022   Chronic kidney disease, stage 3a 11/30/2022   Hypothyroidism 11/30/2022   Diarrhea 01/12/2022   Severe sepsis 01/10/2022   Laceration of left forearm 01/10/2022   Severe sepsis with acute organ dysfunction 01/09/2022   Weakness    Acute respiratory failure with hypoxia    Cardiac arrest    Epigastric pain    CHF exacerbation 04/19/2021   Colitis 04/05/2021   Acute colitis 04/04/2021   HTN (hypertension) 04/04/2021   Atrial fibrillation, chronic 04/04/2021   Chronic diastolic CHF (congestive heart failure) 04/04/2021   Rectal bleeding 04/04/2021   COPD (chronic obstructive pulmonary disease) 04/04/2021   HLD (hyperlipidemia) 04/04/2021   Hypothyroid 04/04/2021   Cirrhosis 04/04/2021   Breast cancer 04/04/2021   Dementia 04/04/2021   History of radiation therapy 04/02/2021   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   Senile purpura 10/30/2020   Acquired thrombophilia 06/13/2020   Essential hypertension 03/14/2020   Hyperlipidemia 03/14/2020   Acute ischemic stroke ([place]) L MCA s/p mechanical thrombectomy, embolic d/t AF not on [facility] 03/10/2020   History of stroke 03/10/2020   Osteopenia 01/21/2020   Family history of breast cancer    Family history of multiple myeloma    Family history of esophageal cancer    Displaced comminuted fracture of shaft of right humerus 12/05/2018   Recurrent falls 12/05/2018   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/2018   MGUS (monoclonal gammopathy of unknown significance) 12/14/2017   Imbalance 06/24/2017    Tremor 06/24/2017   ICD (implantable cardioverter-defibrillator), biventricular, 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   Carcinoma of overlapping sites of right breast in female, estrogen receptor positive 06/16/2016   Acute exacerbation of CHF (congestive heart failure) 06/07/2016   A-fib 04/08/2016   COPD exacerbation 03/22/2016   Acute bronchitis 03/22/2016   Atrial fibrillation with RVR 03/22/2016   Chronic renal insufficiency 03/22/2016   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/30/2016   Cough with expectoration 06/04/2015   Essential thrombocytosis 02/01/2015   Anemia 02/01/2015   Atrial flutter ([place]) 01/23/2015   Atrial fibrillation 01/23/2015   Other long term (current) drug therapy 11/06/2014   High risk medication use 11/06/2014   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   Severe mitral insufficiency 08/01/2014   Long term current use of anticoagulant 06/06/2014   S/P mitral valve repair 06/06/2014   Anxiety 05/13/2014   Chronic obstructive pulmonary disease 03/22/2014   Moderate episode of recurrent major depressive disorder 03/22/2014   Primary biliary cholangitis 02/19/2014   Avitaminosis D 02/19/2014   GERD (gastroesophageal reflux disease) 02/19/2014   PCP:  Sallee Lange, NP Pharmacy:   [facility] [place] [address] [place] [address] Phone: [phone] Fax: [phone]     Social Determinants of Health (SDOH) Social History: SDOH Screenings   Food Insecurity: No Food Insecurity (12/18/2022)  Housing: Low Risk  (12/18/2022)  Transportation Needs: No Transportation Needs (12/18/2022)  Utilities: Not At Risk (12/18/2022)  Depression (PHQ2-9): Low Risk  (10/17/2021)  Tobacco Use: Low Risk  (12/18/2022)   SDOH Interventions:     Readmission Risk Interventions    01/14/2022   10:57 AM 04/06/2021   10:09 AM  Readmission Risk Prevention Plan  Transportation Screening Complete Complete  PCP or Specialist Appt within 3-5 Days   Complete  HRI or [place]  Complete  Social Work Consult for [place] Planning/Counseling  Complete  Palliative Care Screening  Complete  Medication Review [employer]) Complete Complete  HRI or [place] Complete   SW Recovery Care/Counseling Consult Complete   [place] Not Applicable

## 2022-12-21 NOTE — Progress Notes (Signed)
OT Cancellation Note  Patient Details Name: Adriana Martin MRN: [id number] DOB: June 28, 1946   Cancelled Treatment:    Reason Eval/Treat Not Completed: Patient at procedure or test/ unavailable. Pt currently off the floor at [place]. Will re-attempt as able.  Lanelle Bal  [facility] 12/21/2022, 9:31 AM

## 2022-12-21 NOTE — Progress Notes (Signed)
Occupational Therapy Treatment Patient Details Name: Adriana Martin MRN: [id number] DOB: Oct 06, 1945 Today's Date: 12/21/2022   History of present illness Adriana Martin is a 77 y.o. female with medical history significant of HFpEF with last EF of 65-70%, mitral valve replacement, atrial fibrillation on Eliquis, COPD, chronic hypoxic respiratory failure on 2 L, hypertension, CKD stage III, hyperlipidemia, hypothyroidism, breast cancer on tamoxifen, MGUS, frequent UTIs who presents to the ED due to altered mental status. She was admitted from [facility], for treatment for acute encephalopathy, UTI, AKI, acute on chronic diastolic CHF, PAD, primary biliary cholangitis, atrial flutter, dementia, HTN, COPD, breast cancer. She uses 2L/min O2 at baseline.   OT comments  Patient agreeable to OT/PT co-treatment to maximize safety and participation. She was oriented to self only and required multimodal cues to follow single step commands this date. Pt required Max A +2 to come to EOB. Once sitting EOB, pt fatigued quickly and unable to tolerate position for >5 min. Posterior bias noted. Pt was returned to supine for safety with Total A +2. Bed then brought into chair position for self-feeding which she required Max A to complete. Pt was on 2L O2 via  t/o session, unable to get accurate SpO2 reading during activity. Pt left as received all needs in reach. D/C recommendation remains appropriate. OT will continue to follow acutely.    Recommendations for follow up therapy are one component of a multi-disciplinary discharge planning process, led by the attending physician.  Recommendations may be updated based on patient status, additional functional criteria and insurance authorization.    Assistance Recommended at Discharge Frequent or constant Supervision/Assistance  Patient can return home with the following  A lot of help with bathing/dressing/bathroom;Two people to help with walking and/or  transfers;Assistance with cooking/housework;Assist for transportation;Help with stairs or ramp for entrance;Direct supervision/assist for financial management;Direct supervision/assist for medications management   Equipment Recommendations  Hospital bed    Recommendations for Other Services      Precautions / Restrictions Precautions Precautions: Fall Restrictions Weight Bearing Restrictions: No       Mobility Bed Mobility Overal bed mobility: Needs Assistance Bed Mobility: Supine to Sit, Sit to Supine     Supine to sit: Max assist, +2 for physical assistance, HOB elevated Sit to supine: Total assist, +2 for physical assistance   General bed mobility comments: +2 to scoot up toward [place] transfer comment: deferred due to safety, unable to maintain static sitting     Balance Overall balance assessment: Needs assistance Sitting-balance support: No upper extremity supported, Feet supported Sitting balance-Leahy Scale: Poor Sitting balance - Comments: fatigued quickly, unable to tolerate sitting EOB for >5 min, posterior bias Postural control: Posterior lean       ADL either performed or assessed with clinical judgement   ADL Overall ADL's : Needs assistance/impaired Eating/Feeding: Bed level;Maximal assistance;Cueing for sequencing Eating/Feeding Details (indicate cue type and reason): OT provided set up A for lunch tray. Assistance required to place food on spoon and then place utensil in pt's hand, once holding onto spoon able to bring to mouth on her own. Difficulty locating items on tray even with glasses on, overshooting when trying to reach for items on tray. Max VC provided to encourage pt to stabilize pudding cup with L hand, unable to retrieve on her own and pt dropping pudding cup when OT placed in L hand.       Lower  Body Dressing: Maximal assistance;Bed level Lower Body Dressing Details (indicate cue type and reason): socks           Extremity/Trunk Assessment Upper Extremity Assessment Upper Extremity Assessment: Generalized weakness   Lower Extremity Assessment Lower Extremity Assessment: Generalized weakness   Cervical / Trunk Assessment Cervical / Trunk Assessment: Normal    Vision Baseline Vision/History: 1 Wears glasses Patient Visual Report: No change from baseline     Perception     Praxis      Cognition Arousal/Alertness: Lethargic Behavior During Therapy: Flat affect Overall Cognitive Status: History of cognitive impairments - at baseline           General Comments: Oriented to self, constant VC for alertness required t/o session due to lethargy/fatigue        Exercises      Shoulder Instructions       General Comments      Pertinent Vitals/ Pain       Pain Assessment Pain Assessment: Faces Faces Pain Scale: No hurt  Home Living          Prior Functioning/Environment              Frequency  Min 1X/week        Progress Toward Goals  OT Goals(current goals can now be found in the care plan section)  Progress towards OT goals: OT to reassess next treatment  Acute Rehab OT Goals Patient Stated Goal: to go home OT Goal Formulation: With family Time For Goal Achievement: 01/02/23 Potential to Achieve Goals: [employer] Discharge plan remains appropriate;Frequency needs to be updated    Co-evaluation    PT/OT/SLP Co-Evaluation/Treatment: Yes Reason for Co-Treatment: Complexity of the patient's impairments (multi-system involvement);For patient/therapist safety;To address functional/ADL transfers PT goals addressed during session: Mobility/safety with mobility;Balance OT goals addressed during session: ADL's and self-care      AM-PAC OT "6 Clicks" Daily Activity     Outcome Measure   Help from another person eating meals?: A Lot Help from another person taking care of personal grooming?: A Lot Help from another person toileting, which includes  using toliet, bedpan, or urinal?: A Lot Help from another person bathing (including washing, rinsing, drying)?: A Lot Help from another person to put on and taking off regular upper body clothing?: A Lot Help from another person to put on and taking off regular lower body clothing?: A Lot 6 Click Score: 12    End of Session    OT Visit Diagnosis: Unsteadiness on feet (R26.81);Other abnormalities of gait and mobility (R26.89);Muscle weakness (generalized) (M62.81)   Activity Tolerance Patient limited by lethargy;Patient limited by fatigue   Patient Left in bed;with call bell/phone within reach;with bed alarm set   Nurse Communication Mobility status;Other (comment) (notified NT pt still eating and will require assistance)        Time: [id number] OT Time Calculation (min): 32 min  Charges: OT General Charges $OT Visit: 1 Visit OT Treatments $Self Care/Home Management : 8-22 mins  [facility] MS, OTR/L ascom [phone]  12/21/22, 5:49 PM

## 2022-12-21 NOTE — Progress Notes (Signed)
Physical Therapy Treatment Patient Details Name: Adriana Martin MRN: [id number] DOB: 1945/11/30 Today's Date: 12/21/2022   History of Present Illness Adriana Martin is a 77 y.o. female with medical history significant of HFpEF with last EF of 65-70%, mitral valve replacement, atrial fibrillation on Eliquis, COPD, chronic hypoxic respiratory failure on 2 L, hypertension, CKD stage III, hyperlipidemia, hypothyroidism, breast cancer on tamoxifen, MGUS, frequent UTIs who presents to the ED due to altered mental status. She was admitted from [facility], for treatment for acute encephalopathy, UTI, AKI, acute on chronic diastolic CHF, PAD, primary biliary cholangitis, atrial flutter, dementia, HTN, COPD, breast cancer. She uses 2L/min O2 at baseline.    PT Comments    Pt received upright in bed appearing lethargic. She does display periods of being alert, able to answer simple questions with increased time and agreeable to participate with PT/OT co-treat. Pt follows single step commands with multi modal cuing. She relies on bed features and able to sequence her UE'/LE's towards EOB laying onto her R side but is then reliant on maxA+2 to sit upright at [facility] with feet on floor. Frequent periods of posterior bias predominantly relying on HHA+2 for sitting EOB  to prevent posterior leaning. Pt very briefly holds herself up statically for a few second increments but in general can not tolerate sitting EOB without significant support quickly reporting need to return to supine after ~2 minutes. Pt is a totalA+2 to return to supine and reposition upright in bed. Educated pt on benefits of upright sitting for pulmonary toileting/hygiene placed in upright chair position in bed in care of OT for feeding ADL's. Continue to heavily recommend additional PT at discharge as pt far below baseline ambulating with RW short distances. If plan is to d/c home pt will need DME listed below.    Recommendations for follow up  therapy are one component of a multi-disciplinary discharge planning process, led by the attending physician.  Recommendations may be updated based on patient status, additional functional criteria and insurance authorization.  Follow Up Recommendations  Can patient physically be transported by private vehicle: No    Assistance Recommended at Discharge Frequent or constant Supervision/Assistance  Patient can return home with the following Assist for transportation;Direct supervision/assist for medications management;Assistance with cooking/housework;Two people to help with walking and/or transfers;Two people to help with bathing/dressing/bathroom;Help with stairs or ramp for entrance   Equipment Recommendations  Hospital bed (hoyer lift)    Recommendations for Other Services       Precautions / Restrictions Precautions Precautions: Fall Restrictions Weight Bearing Restrictions: No     Mobility  Bed Mobility Overal bed mobility: Needs Assistance Bed Mobility: Supine to Sit, Sit to Supine     Supine to sit: Max assist, +2 for physical assistance, HOB elevated Sit to supine: Total assist     Patient Response: Cooperative  Transfers                   General transfer comment: unable due to difficulty sitting    Ambulation/Gait               General Gait Details: unsafe/unable to tolerate   Stairs             Wheelchair Mobility    Modified Rankin (Stroke Patients Only)       Balance Overall balance assessment: Needs assistance Sitting-balance support: No upper extremity supported, Feet supported Sitting balance-Leahy Scale: Poor Sitting balance - Comments: posterior bias in standing. Brief periods  of close supervision static sitting without posterior bias. Postural control: Posterior lean                                  Cognition Arousal/Alertness: Lethargic Behavior During Therapy: Flat affect                                    General Comments: hx of dementia        Exercises      General Comments        Pertinent Vitals/Pain Pain Assessment Pain Assessment: Faces Faces Pain Scale: No hurt    Home Living                          Prior Function            PT Goals (current goals can now be found in the care plan section) Acute Rehab PT Goals Patient Stated Goal: to go home or not go back to [facility] in order to get better PT Goal Formulation: With family Time For Goal Achievement: 01/02/23 Potential to Achieve Goals: Fair Progress towards PT goals: Progressing toward goals    Frequency    Min 2X/week      PT Plan Current plan remains appropriate    Co-evaluation PT/OT/SLP Co-Evaluation/Treatment: Yes Reason for Co-Treatment: Complexity of the patient's impairments (multi-system involvement);For patient/therapist safety;To address functional/ADL transfers PT goals addressed during session: Mobility/safety with mobility;Balance OT goals addressed during session: ADL's and self-care      AM-PAC PT "6 Clicks" Mobility   Outcome Measure  Help needed turning from your back to your side while in a flat bed without using bedrails?: A Lot Help needed moving from lying on your back to sitting on the side of a flat bed without using bedrails?: A Lot Help needed moving to and from a bed to a chair (including a wheelchair)?: Total Help needed standing up from a chair using your arms (e.g., wheelchair or bedside chair)?: Total   Help needed climbing 3-5 steps with a railing? : Total 6 Click Score: 7    End of Session Equipment Utilized During Treatment: Oxygen Activity Tolerance: Patient limited by fatigue;Patient limited by lethargy Patient left: in bed;with call bell/phone within reach;with bed alarm set Nurse Communication: Mobility status PT Visit Diagnosis: Muscle weakness (generalized) (M62.81);Difficulty in walking, not elsewhere classified (R26.2)      Time: [id number] PT Time Calculation (min) (ACUTE ONLY): 15 min  Charges:  $Therapeutic Activity: 8-22 mins                     Salem Caster. Fairly IV, PT, DPT Physical Therapist- [facility]  12/21/2022, 3:12 PM

## 2022-12-21 NOTE — Progress Notes (Signed)
Progress Note    12/21/2022 10:11 AM * No surgery found *  Subjective:  Adriana Martin is a 77 y.o. female with medical history significant of HFpEF with last EF of 65-70%, mitral valve replacement, atrial fibrillation on Eliquis, COPD, chronic hypoxic respiratory failure on 2 L, hypertension, CKD stage III, hyperlipidemia, hypothyroidism, breast cancer on tamoxifen, MGUS, frequent UTIs who presents to the ED due to altered mental status.   On exam today patient is awake but unable to answer any questions. She doesn't seem to speak much. Patient is known to Vascular surgery having venous insufficiency related to her diastolic heart failure. Noted bilateral lower extremity erythema and bruising most likely related to her being on eliquis. Both lower extremities are warm to touch.    Vitals:   12/21/22 0611 12/21/22 0735  BP: 138/79 (!) 146/84  Pulse: 60 60  Resp: 19 18  Temp: 97.8 F (36.6 C) (!) 97.5 F (36.4 C)  SpO2: 97% 100%   Physical Exam: Cardiac:  Irregular Rhythm with rate control in the 90's  Lungs:  Positive rhonchi with rales in the bases. Non productive cough Incisions:  N/A Extremities:  Palpable pulse bilaterally, bilateral lower extremity bruising  Abdomen:  Positive bowel sounds, soft, non tender and non distended.  Neurologic: AAOX1 name only. Hx of dementia with metabolic encephalopathy.   CBC    Component Value Date/Time   WBC 13.1 (H) 12/21/2022 0628   RBC 4.06 12/21/2022 0628   HGB 10.9 (L) 12/21/2022 0628   HGB 11.1 (L) 12/30/2014 0921   HCT 36.9 12/21/2022 0628   HCT 34.4 (L) 12/30/2014 0921   PLT 131 (L) 12/21/2022 0628   PLT 482 (H) 12/30/2014 0921   MCV 90.9 12/21/2022 0628   MCV 88 12/30/2014 0921   MCH 26.8 12/21/2022 0628   MCHC 29.5 (L) 12/21/2022 0628   RDW 17.0 (H) 12/21/2022 0628   RDW 19.2 (H) 12/30/2014 0921   LYMPHSABS 0.7 12/19/2022 0618   LYMPHSABS 0.7 (L) 09/25/2014 1437   MONOABS 0.9 12/19/2022 0618   MONOABS 0.3 09/25/2014  1437   EOSABS 0.2 12/19/2022 0618   EOSABS 0.2 09/25/2014 1437   BASOSABS 0.1 12/19/2022 0618   BASOSABS 0.1 09/25/2014 1437    BMET    Component Value Date/Time   NA 144 12/20/2022 1222   NA 129 (L) 12/30/2014 0921   K 4.0 12/20/2022 1222   K 4.7 12/30/2014 0921   CL 107 12/20/2022 1222   CL 91 (L) 12/30/2014 0921   CO2 26 12/20/2022 1222   CO2 26 12/30/2014 0921   GLUCOSE 116 (H) 12/20/2022 1222   GLUCOSE 103 (H) 12/30/2014 0921   BUN 47 (H) 12/20/2022 1222   BUN 22 (H) 12/30/2014 0921   CREATININE 1.69 (H) 12/21/2022 0628   CREATININE 1.22 (H) 12/30/2014 0921   CALCIUM 8.6 (L) 12/20/2022 1222   CALCIUM 9.1 12/30/2014 0921   GFRNONAA 31 (L) 12/21/2022 0628   GFRNONAA 45 (L) 12/30/2014 0921   GFRAA 47 (L) 04/24/2020 0925   GFRAA 52 (L) 12/30/2014 0921    INR    Component Value Date/Time   INR 2.3 (H) 12/18/2022 1658   INR 2.0 12/30/2014 0921     Intake/Output Summary (Last 24 hours) at 12/21/2022 1011 Last data filed at 12/20/2022 1840 Gross per 24 hour  Intake 240 ml  Output --  Net 240 ml     Assessment/Plan:  77 y.o. female who was admitted for AMS with positive UTI.  * No  surgery found *   PLAN: Will order and follow up on Ultrasound of bilateral lower extremities with ABI's. If changes from prior warrant intervention we will let primary team know so as to how to proceed with anticoagulation. Recommend continuing lovenox for now. If no intervention needed patient will need to be back on her eliquis.  Patient follows with vascular surgery yearly evaluating her lower extremities.   DVT prophylaxis:  Lovenox 70 mg daily   Drema Pry [facility] 12/21/2022 10:11 AM

## 2022-12-21 NOTE — Progress Notes (Signed)
Palliative-   Chart reviewed including labs, progress notes, imaging from this and previous encounters.  Spoke with patient's daughter Elainea Terpening.  Plan to meet tomorrow at 3pm for Arrey discussion.  If patient discharges before then, will refer for outpatient Palliative.   Mariana Kaufman, AGNP-C Palliative Medicine  No charge

## 2022-12-21 NOTE — Progress Notes (Signed)
PT Cancellation Note  Patient Details Name: Adriana Martin MRN: [id number] DOB: 09-06-1946   Cancelled Treatment:    Reason Eval/Treat Not Completed: Patient at procedure or test/unavailable. Pt's chart reviewed. Upon entry to room pt off unit for procedure. PT to re-attempt as able.   Salem Caster. Fairly IV, PT, DPT Physical Therapist- [facility]  12/21/2022, 10:13 AM

## 2022-12-22 DIAGNOSIS — G9341 Metabolic encephalopathy: Secondary | ICD-10-CM | POA: Diagnosis not present

## 2022-12-22 DIAGNOSIS — Z7189 Other specified counseling: Secondary | ICD-10-CM

## 2022-12-22 DIAGNOSIS — F0390 Unspecified dementia without behavioral disturbance: Secondary | ICD-10-CM

## 2022-12-22 DIAGNOSIS — N3001 Acute cystitis with hematuria: Secondary | ICD-10-CM | POA: Diagnosis not present

## 2022-12-22 DIAGNOSIS — N179 Acute kidney failure, unspecified: Secondary | ICD-10-CM | POA: Diagnosis not present

## 2022-12-22 DIAGNOSIS — J189 Pneumonia, unspecified organism: Secondary | ICD-10-CM | POA: Diagnosis not present

## 2022-12-22 LAB — CBC
HCT: 35.2 % — ABNORMAL LOW (ref 36.0–46.0)
Hemoglobin: 10.6 g/dL — ABNORMAL LOW (ref 12.0–15.0)
MCH: 26.8 pg (ref 26.0–34.0)
MCHC: 30.1 g/dL (ref 30.0–36.0)
MCV: 88.9 fL (ref 80.0–100.0)
Platelets: 142 10*3/uL — ABNORMAL LOW (ref 150–400)
RBC: 3.96 MIL/uL (ref 3.87–5.11)
RDW: 16.9 % — ABNORMAL HIGH (ref 11.5–15.5)
WBC: 15.2 10*3/uL — ABNORMAL HIGH (ref 4.0–10.5)
nRBC: 1.2 % — ABNORMAL HIGH (ref 0.0–0.2)

## 2022-12-22 LAB — BASIC METABOLIC PANEL
Anion gap: 10 (ref 5–15)
BUN: 40 mg/dL — ABNORMAL HIGH (ref 8–23)
CO2: 25 mmol/L (ref 22–32)
Calcium: 8.6 mg/dL — ABNORMAL LOW (ref 8.9–10.3)
Chloride: 106 mmol/L (ref 98–111)
Creatinine, Ser: 1.48 mg/dL — ABNORMAL HIGH (ref 0.44–1.00)
GFR, Estimated: 36 mL/min — ABNORMAL LOW (ref >60)
Glucose, Bld: 89 mg/dL (ref 70–99)
Potassium: 3.7 mmol/L (ref 3.5–5.1)
Sodium: 141 mmol/L (ref 135–145)

## 2022-12-22 MED ORDER — ERTAPENEM SODIUM 1 G IJ SOLR
500.0000 mg | INTRAMUSCULAR | Status: AC
  Administered 2022-12-22: 500 mg via INTRAMUSCULAR
  Filled 2022-12-22: qty 0.5

## 2022-12-22 MED ORDER — FUROSEMIDE 20 MG PO TABS
20.0000 mg | ORAL_TABLET | Freq: Every day | ORAL | Status: DC
  Administered 2022-12-22 – 2022-12-23 (×2): 20 mg via ORAL
  Filled 2022-12-22 (×2): qty 1

## 2022-12-22 MED ORDER — LIDOCAINE HCL 1 % IJ SOLN
3.2000 mL | Freq: Once | INTRAMUSCULAR | Status: AC
  Administered 2022-12-22: 3.2 mL
  Filled 2022-12-22: qty 3.2

## 2022-12-22 MED ORDER — FOSFOMYCIN TROMETHAMINE 3 G PO PACK: 3.0000 g | PACK | Freq: Once | ORAL | Status: DC

## 2022-12-22 NOTE — Progress Notes (Signed)
Palliative-  Brief note- full consult note to follow.   Discussed patient's illness trajectory and comorbidities. Options for hospice care at home were discussed.  No decisions made today.  Will touch base with family tomorrow.   Mariana Kaufman, AGNP-C Palliative Medicine  No charge

## 2022-12-22 NOTE — Consult Note (Signed)
                                                                                   Consultation Note Date: 12/22/2022   [name]  DOB: 1945/11/27  MRN: [id number]  Age / Sex: 77 y.o., female  PCP: Dayton Martes Victoriano Lain, NP Referring Physician: Loletha Grayer, MD  Reason for Consultation:   HPI/Patient Profile: 77 y.o. female  with past medical history of *** admitted on 12/18/2022 with ***.   Primary Decision Maker {Primary Decision [id number]  Discussion: ***    SUMMARY OF RECOMMENDATIONS   *** Code Status/Advance Care Planning: {Palliative Code status:23503}   Prognosis:   {Palliative Care Prognosis:23504}  Discharge Planning: {Palliative dispostion:23505}  Primary Diagnoses: Present on Admission:  Essential hypertension  Primary biliary cholangitis  Atrial flutter (HCC)  COPD (chronic obstructive pulmonary disease)  Dementia  Breast cancer  Chronic diastolic CHF (congestive heart failure)   Review of Systems  Physical Exam  Vital Signs: BP (!) 149/127 (BP Location: Right Arm)   Pulse 67   Temp 97.6 F (36.4 C) (Oral)   Resp 20   Ht 5\' 2"  (1.575 m)   Wt 70.1 kg   SpO2 93%   BMI 28.27 kg/m  Pain Scale: 0-10   Pain Score: 0-No pain   SpO2: SpO2: 93 % O2 Device:SpO2: 93 % O2 Flow Rate: .O2 Flow Rate (L/min): 2 L/min  IO: Intake/output summary:  Intake/Output Summary (Last 24 hours) at 12/22/2022 1554 Last data filed at 12/22/2022 1345 Gross per 24 hour  Intake 420.36 ml  Output 600 ml  Net -179.64 ml    LBM: Last BM Date : 12/19/22 Baseline Weight: Weight: 67.1 kg Most recent weight: Weight: 70.1 kg       Thank you for this consult. Palliative medicine will continue to follow and assist as needed.  Time Total: *** Greater than 50%  of this time was spent counseling and coordinating care related to the above assessment and plan.  Signed by: Mariana Kaufman, AGNP-C Palliative Medicine    Please contact Palliative  Medicine Team phone at [phone] for questions and concerns.  For individual provider: See Shea Evans

## 2022-12-22 NOTE — Progress Notes (Signed)
Progress Note   Patient: Adriana Martin [id number] DOB: 1945/11/29 DOA: 12/18/2022     3 DOS: the patient was seen and examined on 12/22/2022   Brief hospital course: 77 y.o. female with medical history significant of HFpEF with last EF of 65-70%, mitral valve replacement, atrial fibrillation on Eliquis, COPD, chronic hypoxic respiratory failure on 2 L, hypertension, CKD stage III, hyperlipidemia, hypothyroidism, breast cancer on tamoxifen, MGUS, frequent UTIs who presents to the ED due to altered mental status.  History obtained through chart review due to patient's altered mental status.   Per chart review, [facility], increasing agitation and disorientation, and possible urinary tract infection. Staff at SNF noted that patient was having some shortness of breath that is why chest x-ray was obtained.  Patient was not on her home oxygen.  Urinalysis at the center demonstrated possible urinary tract infection with elevated WBC in the urine in addition to an AKI with creatinine of 2.6.  Lasix was not administered due to AKI.  They note that patient is bedbound at rest.   Patient is able to tell me her name, but otherwise is unable to answer any other questions.   Collateral history obtained from patient's daughters.  Patient has been sitting more frequently in her own stool and due to this has led to some general irritation.  They are worried that this may have led to the UTI.  In addition, patient has been having some diarrhea.  They were unaware of any shortness of breath.  3/30.  Patient opens her eyes but does not talk much.  Placed on a diet by speech therapy.  Niece at the bedside not sure if they want to go back to rehab or take home.  Has not walked at rehab. 3/31.  Patient more alert today following commands and more talkative today.  Resistance profile on the Klebsiella in the urine culture.  Has ESBL.  Discontinue Rocephin and  start meropenem.  Likely will switch over to fosfomycin upon disposition. 4/1.  IV fluids discontinued and a dose of IV Lasix given for patient's shortness of breath.  Chest x-ray read as rightly improved pulmonary edema. 4/2.  Creatinine down to 1.48.  Continue meropenem today.  Assessment and Plan: * Acute metabolic encephalopathy Mental status better than on admission..  Continue treatment for infection (possible pneumonia seen on CT scan, acute cystitis with hematuria).  Changed antibiotics on 3/31 secondary to ESBL Klebsiella in urine culture.  Acute cystitis with hematuria ESBL klebsiella growing out of urine culture.  Switch antibiotic over to meropenem.  Likely will use fosfomycin upon discharge.  Multifocal pneumonia Seen on CT scan.  With ESBL Klebsiella growing out of urine culture switch Rocephin over to meropenem on 3/31.  Continue Zithromax.  Acute kidney injury superimposed on CKD Acute kidney injury on CKD stage IIIa.  Creatinine 2.38 on presentation and down to 1.48.  Chronic diastolic CHF (congestive heart failure) Elevated BNP, last echocardiogram showed EF normal range.  Stopped IV fluid on 12/21/2022.  Given 1 dose of IV Lasix on 12/21/2022.  Continue oral Lasix on 12/22/2022.  PAD (peripheral artery disease) ABI came back okay.  No intervention.  Discontinue Lovenox and placed back on Eliquis on 4/1.  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  Atrial flutter ([place]) Placed back on Eliquis.  Dementia - Delirium precautions - Continue home Memantine  Essential hypertension Hold home Lisinopril in  the setting of AKI   COPD (chronic obstructive pulmonary disease) - Continue home bronchodilators.  - Continue home supplemental oxygen at 2L  Breast cancer - Continue home Tamoxifen        Subjective: Patient answering questions.  Feels okay.  Breathing better today than yesterday.  Admitted with acute metabolic  encephalopathy found to have ESBL Klebsiella urinary infection and possible pneumonia.  Physical Exam: Vitals:   12/21/22 2150 12/22/22 0029 12/22/22 0454 12/22/22 0810  BP: (!) 138/106 131/60 (!) 149/127   Pulse: 66 61 67   Resp: 20 20 20    Temp: 97.7 F (36.5 C) 98.6 F (37 C) 97.6 F (36.4 C)   TempSrc: Oral Oral Oral   SpO2: 92% 95% 92% 93%  Weight:      Height:       Physical Exam HENT:     Head: Normocephalic.     Mouth/Throat:     Pharynx: No oropharyngeal exudate.  Eyes:     General: Lids are normal.     Conjunctiva/sclera: Conjunctivae normal.  Cardiovascular:     Rate and Rhythm: Normal rate and regular rhythm.     Heart sounds: Normal heart sounds, S1 normal and S2 normal.  Pulmonary:     Breath sounds: Normal breath sounds. No decreased breath sounds, wheezing, rhonchi or rales.  Abdominal:     Palpations: Abdomen is soft.     Tenderness: There is no abdominal tenderness.  Musculoskeletal:     Right lower leg: No swelling.     Left lower leg: No swelling.  Skin:    General: Skin is warm.     Findings: No rash.     Comments: Bruising bilateral arms and legs.  Neurological:     Mental Status: She is alert.     Comments: Able to straight leg raise.     Data Reviewed: Creatinine 1.48, white blood cell count 15.2, hemoglobin 10.6, platelet count 142  Family Communication: Updated patient's daughter on the phone  Disposition: Status is: Inpatient Remains inpatient appropriate because: Meropenem started on 3/31.  White blood cell count still little high.  Palliative care to meet with family today. Planned Discharge Destination: Likely home with home health.  Would qualify for palliative at home.    Time spent: 28 minutes  Author: Loletha Grayer, MD 12/22/2022 2:21 PM  For on call review [email or web address].

## 2022-12-23 DIAGNOSIS — N3001 Acute cystitis with hematuria: Secondary | ICD-10-CM | POA: Diagnosis not present

## 2022-12-23 DIAGNOSIS — Z7189 Other specified counseling: Secondary | ICD-10-CM | POA: Diagnosis not present

## 2022-12-23 DIAGNOSIS — G9341 Metabolic encephalopathy: Secondary | ICD-10-CM | POA: Diagnosis not present

## 2022-12-23 DIAGNOSIS — J189 Pneumonia, unspecified organism: Secondary | ICD-10-CM | POA: Diagnosis not present

## 2022-12-23 LAB — BASIC METABOLIC PANEL
Anion gap: 9 (ref 5–15)
BUN: 36 mg/dL — ABNORMAL HIGH (ref 8–23)
CO2: 27 mmol/L (ref 22–32)
Calcium: 8.8 mg/dL — ABNORMAL LOW (ref 8.9–10.3)
Chloride: 104 mmol/L (ref 98–111)
Creatinine, Ser: 1.39 mg/dL — ABNORMAL HIGH (ref 0.44–1.00)
GFR, Estimated: 39 mL/min — ABNORMAL LOW (ref >60)
Glucose, Bld: 107 mg/dL — ABNORMAL HIGH (ref 70–99)
Potassium: 3.7 mmol/L (ref 3.5–5.1)
Sodium: 140 mmol/L (ref 135–145)

## 2022-12-23 LAB — CBC
HCT: 37.1 % (ref 36.0–46.0)
Hemoglobin: 11.1 g/dL — ABNORMAL LOW (ref 12.0–15.0)
MCH: 26.2 pg (ref 26.0–34.0)
MCHC: 29.9 g/dL — ABNORMAL LOW (ref 30.0–36.0)
MCV: 87.7 fL (ref 80.0–100.0)
Platelets: 149 10*3/uL — ABNORMAL LOW (ref 150–400)
RBC: 4.23 MIL/uL (ref 3.87–5.11)
RDW: 16.8 % — ABNORMAL HIGH (ref 11.5–15.5)
WBC: 15.2 10*3/uL — ABNORMAL HIGH (ref 4.0–10.5)
nRBC: 1.1 % — ABNORMAL HIGH (ref 0.0–0.2)

## 2022-12-23 MED ORDER — SCOPOLAMINE 1 MG/3DAYS TD PT72
1.0000 | MEDICATED_PATCH | TRANSDERMAL | Status: DC
  Administered 2022-12-23: 1.5 mg via TRANSDERMAL
  Filled 2022-12-23: qty 1

## 2022-12-23 MED ORDER — ALBUTEROL SULFATE (2.5 MG/3ML) 0.083% IN NEBU
3.0000 mL | INHALATION_SOLUTION | RESPIRATORY_TRACT | Status: DC | PRN
  Administered 2022-12-25: 3 mL via RESPIRATORY_TRACT
  Filled 2022-12-23: qty 3

## 2022-12-23 MED ORDER — NITROFURANTOIN MONOHYD MACRO 100 MG PO CAPS
100.0000 mg | ORAL_CAPSULE | Freq: Two times a day (BID) | ORAL | Status: DC
  Administered 2022-12-23 (×2): 100 mg via ORAL
  Filled 2022-12-23 (×5): qty 1

## 2022-12-23 NOTE — Progress Notes (Signed)
PT Cancellation Note  Patient Details Name: Adriana Martin MRN: [id number] DOB: 18-Jun-1946   Cancelled Treatment:    Reason Eval/Treat Not Completed: Other (comment). Pt's chart reviewed. Per EMR, plan for [facility] order for hospice. PT to sign off at this time.    Salem Caster. Fairly IV, PT, DPT Physical Therapist- [facility]  12/23/2022, 3:02 PM

## 2022-12-23 NOTE — Progress Notes (Signed)
PROGRESS NOTE    Adriana Martin  [id number] DOB: April 14, 1946 DOA: 12/18/2022 PCP: Sallee Lange, NP    Brief Narrative:  77 y.o. female with medical history significant of HFpEF with last EF of 65-70%, mitral valve replacement, atrial fibrillation on Eliquis, COPD, chronic hypoxic respiratory failure on 2 L, hypertension, CKD stage III, hyperlipidemia, hypothyroidism, breast cancer on tamoxifen, MGUS, frequent UTIs who presents to the ED due to altered mental status.  History obtained through chart review due to patient's altered mental status.   Per chart review, [facility], increasing agitation and disorientation, and possible urinary tract infection. Staff at SNF noted that patient was having some shortness of breath that is why chest x-ray was obtained.  Patient was not on her home oxygen.  Urinalysis at the center demonstrated possible urinary tract infection with elevated WBC in the urine in addition to an AKI with creatinine of 2.6.  Lasix was not administered due to AKI.  They note that patient is bedbound at rest.   Patient is able to tell me her name, but otherwise is unable to answer any other questions.   Collateral history obtained from patient's daughters.  Patient has been sitting more frequently in her own stool and due to this has led to some general irritation.  They are worried that this may have led to the UTI.  In addition, patient has been having some diarrhea.  They were unaware of any shortness of breath.   3/30.  Patient opens her eyes but does not talk much.  Placed on a diet by speech therapy.  Niece at the bedside not sure if they want to go back to rehab or take home.  Has not walked at rehab. 3/31.  Patient more alert today following commands and more talkative today.  Resistance profile on the Klebsiella in the urine culture.  Has ESBL.  Discontinue Rocephin and start meropenem.  Likely will  switch over to fosfomycin upon disposition. 4/1.  IV fluids discontinued and a dose of IV Lasix given for patient's shortness of breath.  Chest x-ray read as rightly improved pulmonary edema. 4/2.  Creatinine down to 1.48.  Continue meropenem today. 4/3: Per RN patient lost IV access.  Only available oral option would be Macrobid.  Discussed with pharmacy.  Will start medication today.   Assessment & Plan:   Principal Problem:   Acute metabolic encephalopathy Active Problems:   Acute cystitis with hematuria   Multifocal pneumonia   Acute kidney injury superimposed on CKD   Chronic diastolic CHF (congestive heart failure)   PAD (peripheral artery disease)   Primary biliary cholangitis   Atrial flutter (HCC)   Dementia   Essential hypertension   COPD (chronic obstructive pulmonary disease)   Breast cancer   ESBL (extended spectrum beta-lactamase) producing bacteria infection   Acute metabolic encephalopathy Mental status appears waxing and waning.  On my evaluation this morning patient arouses and answers questions however speech often muffled and patient remains very lethargic.  Treat UTI with Macrobid as below as patient does not have a working PIV  Acute cystitis with hematuria ESBL klebsiella growing out of urine culture.  Received meropenem previously.  Was initially switching to fosfomycin however discussed with pharmacy and this antibiotic has poor penetrance for ESBL infections.  Will use Macrobid per sensitivities.  Plan for no more than 5-day course.     Multifocal pneumonia Seen on CT scan.  Received  Rocephin then subsequently meropenem.  Completed course of Zithromax.  Now on Macrobid as above.  Acute kidney injury superimposed on CKD Acute kidney injury on CKD stage IIIa.  Suspect intravascular volume depletion, multifactorial in etiology.  Creatinine improving.   Chronic diastolic CHF (congestive heart failure) Elevated BNP, last echocardiogram showed EF normal range.   Stopped IV fluid on 12/21/2022.  Given 1 dose of IV Lasix on 12/21/2022.  Continue oral Lasix started on 12/22/2022.   PAD (peripheral artery disease) ABI came back okay.  No intervention.  Discontinue Lovenox and placed back on Eliquis on 4/1.   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   Atrial flutter ([place]) Placed back on Eliquis.   Dementia - Delirium precautions - Continue home Memantine   Essential hypertension Hold home Lisinopril in the setting of AKI    COPD (chronic obstructive pulmonary disease) - Continue home bronchodilators.  - Continue home supplemental oxygen at 2L   Breast cancer - Continue home Tamoxifen  Goals of care Palliative care evaluating.  Will discuss with family.  Patient with poor functional status and overall guarded prognosis.  Patient may be appropriate for further de-escalation to comfort measures and hospice referral should it be within the family's goals of care.  Patient remains DNR.   DVT prophylaxis: Apixaban Code Status: DNR Family Communication: None today Disposition Plan: Status is: Inpatient Remains inpatient appropriate because: ESBL UTI on antibiotics.  Acute metabolic encephalopathy.   Level of care: Telemetry Medical  Consultants:  Palliative care  Procedures:  None  Antimicrobials: Macrobid   Subjective: Seen and examined.  Asleep/lethargic.  Does awaken on phone prompting.  Does answer questions appropriately however muffled speech and weak voice.  Objective: Vitals:   12/22/22 2129 12/23/22 0500 12/23/22 0531 12/23/22 0801  BP: (!) 117/57  123/85 (!) 143/94  Pulse: 60  65 62  Resp: 20  20 (!) 24  Temp: 98.3 F (36.8 C)  97.7 F (36.5 C) 97.8 F (36.6 C)  TempSrc: Oral  Oral Oral  SpO2: 100%  96% 99%  Weight:  69.6 kg    Height:        Intake/Output Summary (Last 24 hours) at 12/23/2022 1301 Last data filed at 12/23/2022 1038 Gross per 24 hour  Intake 219.64 ml   Output 850 ml  Net -630.36 ml   Filed Weights   12/19/22 0500 12/20/22 0500 12/23/22 0500  Weight: 69 kg 70.1 kg 69.6 kg    Examination:  General exam: Lethargic.  Appears chronically ill Respiratory system: Scattered crackles bilaterally.  No wheeze.  Normal work of breathing.  2 L Cardiovascular system: S1-S2, RRR, no murmurs, no pedal edema Gastrointestinal system: Soft, NT/ND, normal bowel sounds Central nervous system: Lethargic.  Oriented x 2 Extremities: Symmetrically decreased lower bilateral lower extremities.  Gait not assessed Skin: No rashes, lesions or ulcers Psychiatry: Judgement and insight appear impaired. Mood & affect flattened.     Data Reviewed: I have personally reviewed following labs and imaging studies  CBC: Recent Labs  Lab 12/18/22 1038 12/19/22 0618 12/20/22 1222 12/21/22 0628 12/22/22 0729 12/23/22 0752  WBC 14.4* 13.6* 15.1* 13.1* 15.2* 15.2*  NEUTROABS 13.2* 11.7*  --   --   --   --   HGB 11.1* 11.0* 10.9* 10.9* 10.6* 11.1*  HCT 38.7 36.7 37.2 36.9 35.2* 37.1  MCV 93.7 89.5 91.0 90.9 88.9 87.7  PLT 178 164 132* 131* 142* 123456*   Basic Metabolic Panel: Recent Labs  Lab 12/18/22 1658 12/19/22 0618 12/20/22 1222 12/21/22 0628 12/22/22 0729 12/23/22 0752  NA 144 146* 144  --  141 140  K 4.8 4.7 4.0  --  3.7 3.7  CL 104 109 107  --  106 104  CO2 24 23 26   --  25 27  GLUCOSE 128* 86 116*  --  89 107*  BUN 47* 47* 47*  --  40* 36*  CREATININE 2.20* 2.18* 1.95* 1.69* 1.48* 1.39*  CALCIUM 8.9 8.7* 8.6*  --  8.6* 8.8*   GFR: Estimated Creatinine Clearance: 31 mL/min (A) (by C-G formula based on SCr of 1.39 mg/dL (H)). Liver Function Tests: Recent Labs  Lab 12/18/22 1038 12/19/22 0618  AST 109* 76*  ALT 125* 101*  ALKPHOS 68 67  BILITOT 1.2 0.9  PROT 6.4* 6.3*  ALBUMIN 2.7* 2.7*   No results for input(s): "LIPASE", "AMYLASE" in the last 168 hours. No results for input(s): "AMMONIA" in the last 168 hours. Coagulation  Profile: Recent Labs  Lab 12/18/22 1658  INR 2.3*   Cardiac Enzymes: No results for input(s): "CKTOTAL", "CKMB", "CKMBINDEX", "TROPONINI" in the last 168 hours. BNP (last 3 results) No results for input(s): "PROBNP" in the last 8760 hours. HbA1C: No results for input(s): "HGBA1C" in the last 72 hours. CBG: No results for input(s): "GLUCAP" in the last 168 hours. Lipid Profile: No results for input(s): "CHOL", "HDL", "LDLCALC", "TRIG", "CHOLHDL", "LDLDIRECT" in the last 72 hours. Thyroid Function Tests: No results for input(s): "TSH", "T4TOTAL", "FREET4", "T3FREE", "THYROIDAB" in the last 72 hours. Anemia Panel: No results for input(s): "VITAMINB12", "FOLATE", "FERRITIN", "TIBC", "IRON", "RETICCTPCT" in the last 72 hours. Sepsis Labs: Recent Labs  Lab 12/18/22 1550  LATICACIDVEN 2.3*    Recent Results (from the past 240 hour(s))  Urine Culture     Status: Abnormal   Collection Time: 12/18/22  2:05 PM   Specimen: Urine, Random  Result Value Ref Range Status   Specimen Description   Final    URINE, RANDOM Performed at [facility], [address], [place], [place] [address]    Special Requests   Final    NONE Performed at [facility], [address] [place], [place] [address]    Culture (A)  Final    >=100,000 COLONIES/mL KLEBSIELLA PNEUMONIAE Confirmed Extended Spectrum Beta-Lactamase Producer (ESBL).  In bloodstream infections from ESBL organisms, carbapenems are preferred over piperacillin/tazobactam. They are shown to have a lower risk of mortality.    Report Status 12/20/2022 FINAL  Final   Organism ID, Bacteria KLEBSIELLA PNEUMONIAE (A)  Final      Susceptibility   Klebsiella pneumoniae - MIC*    AMPICILLIN >=32 RESISTANT Resistant     CEFAZOLIN >=64 RESISTANT Resistant     CEFEPIME >=32 RESISTANT Resistant     CEFTRIAXONE >=64 RESISTANT Resistant     CIPROFLOXACIN 0.5 INTERMEDIATE Intermediate     GENTAMICIN <=1 SENSITIVE Sensitive      IMIPENEM 0.5 SENSITIVE Sensitive     NITROFURANTOIN 32 SENSITIVE Sensitive     TRIMETH/SULFA >=320 RESISTANT Resistant     AMPICILLIN/SULBACTAM >=32 RESISTANT Resistant     PIP/TAZO <=4 SENSITIVE Sensitive     * >=100,000 COLONIES/mL KLEBSIELLA PNEUMONIAE         Radiology Studies: No results found.      Scheduled Meds:  apixaban  5 mg Oral BID   ezetimibe  10 mg Oral Daily   fluticasone furoate-vilanterol  1 puff Inhalation Daily   furosemide  20  mg Oral Daily   ipratropium-albuterol  3 mL Nebulization TID   levothyroxine  50 mcg Oral QAC breakfast   memantine  5 mg Oral BID   nitrofurantoin (macrocrystal-monohydrate)  100 mg Oral Q12H   pantoprazole  40 mg Oral BID   pravastatin  40 mg Oral QHS   predniSONE  5 mg Oral Q breakfast   sodium chloride flush  3 mL Intravenous Q12H   tamoxifen  20 mg Oral Daily   traZODone  25 mg Oral QHS   ursodiol  600 mg Oral BID   venlafaxine XR  75 mg Oral Daily   Continuous Infusions:   LOS: 4 days     Sidney Ace, MD [facility]   If 7PM-7AM, please contact night-coverage  12/23/2022, 1:01 PM

## 2022-12-23 NOTE — Progress Notes (Signed)
OT Cancellation Note  Patient Details Name: Adriana Martin MRN: [id number] DOB: 01-31-1946   Cancelled Treatment:    Reason Eval/Treat Not Completed: Other (comment) (pt now on hospice care; per PT, is now declining therapy; sign off.)  [name] 12/23/2022, 3:08 PM

## 2022-12-23 NOTE — Progress Notes (Signed)
Daily Progress Note   [name]       Date: 12/23/2022 DOB: [birth year]  Age: 77 y.o. MRN#: [id number] Attending Physician: Sidney Ace, MD Primary Care Physician: Sallee Lange, NP Admit Date: 12/18/2022  Reason for Consultation/Follow-up: Establishing goals of care  Patient Profile/HPI:  77 y.o. female  with past medical history of dementia, HFpEF 65-70%, a fib on Eliquis, COPD, chronic resp failure, HTN, CKD III, HLD, hypothyroidism, MGUS, breast cancer, frequent UTIs  admitted on 12/18/2022 with altered mental status. Workup reveals UTI. She had recent admission and dc'd to SNF. Palliative medicine consulted for [place].   Subjective: Chart reviewed including labs, progress notes, imaging from this and previous encounters.  IV access lost. Started on po antibiotic. Per RN has been lethargic most of the day, not really eating or drinking. Hard to get her to take pills.  On evaluation, she doesn't arouse easily. She is gurgling a bit.  I spoke with daughter, [name] who is also her HCPOA.  Discussed patient doesn't seem to be recovering, has lost IV access, concern she is declining.  Reviewed options for hospice.  [name] would like to take patient home with hospice. She would like to continue po antibiotic for current UTI.  We discussed her aspiration and the gurgling today that is worrisome for further aspiration. Discussed recommendations for comfort and hospice.   Review of Systems  Unable to perform ROS: Mental status change     Physical Exam Vitals and nursing note reviewed.  Constitutional:      Comments: lethargic  Cardiovascular:     Rate and Rhythm: Normal rate.  Pulmonary:     Comments: gurgling            Vital Signs: BP (!) 143/94 (BP  Location: Left Arm)   Pulse 62   Temp 97.8 F (36.6 C) (Oral)   Resp (!) 24   Ht 5\' 2"  (1.575 m)   Wt 69.6 kg   SpO2 99%   BMI 28.06 kg/m  SpO2: SpO2: 99 % O2 Device: O2 Device: Nasal Cannula O2 Flow Rate: O2 Flow Rate (L/min): 2 L/min  Intake/output summary:  Intake/Output Summary (Last 24 hours) at 12/23/2022 1416 Last data filed at 12/23/2022 1038 Gross per 24 hour  Intake 219.64 ml  Output 250 ml  Net -30.36 ml   LBM: Last BM Date : 12/19/22 Baseline Weight: Weight: 67.1 kg Most recent weight: Weight: 69.6 kg       Palliative Assessment/Data: PPS: 20%      Patient Active Problem List   Diagnosis Date Noted   ESBL (extended spectrum beta-lactamase) producing bacteria infection 99991111   Acute metabolic encephalopathy [id number]   Acute cystitis with hematuria 12/19/2022   Multifocal pneumonia 12/19/2022   Acute kidney injury superimposed on CKD 12/19/2022   PAD (peripheral artery disease) 12/18/2022   Myocardial injury 11/30/2022   Chronic kidney disease, stage 3a 11/30/2022   Hypothyroidism 11/30/2022   Diarrhea 01/12/2022   Severe sepsis 01/10/2022   Laceration of left forearm 01/10/2022   Severe sepsis with acute organ dysfunction 01/09/2022   Weakness    Acute respiratory failure with hypoxia    Cardiac arrest    Epigastric pain    CHF exacerbation 04/19/2021   Colitis 04/05/2021   Acute colitis 04/04/2021   HTN (hypertension) 04/04/2021   Atrial fibrillation, chronic 04/04/2021   Chronic diastolic CHF (congestive heart failure) 04/04/2021   Rectal bleeding 04/04/2021   COPD (chronic obstructive pulmonary disease) 04/04/2021   HLD (hyperlipidemia) 04/04/2021   Hypothyroid 04/04/2021   Cirrhosis 04/04/2021   Breast cancer 04/04/2021   Dementia 04/04/2021   History of radiation therapy 04/02/2021   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   Senile purpura 10/30/2020   Acquired thrombophilia 06/13/2020   Essential hypertension 03/14/2020    Hyperlipidemia 03/14/2020   Acute ischemic stroke ([place]) L MCA s/p mechanical thrombectomy, embolic d/t AF not on [facility] 03/10/2020   History of stroke 03/10/2020   Osteopenia 01/21/2020   Family history of breast cancer    Family history of multiple myeloma    Family history of esophageal cancer    Displaced comminuted fracture of shaft of right humerus 12/05/2018   Recurrent falls 12/05/2018   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/2018   MGUS (monoclonal gammopathy of unknown significance) 12/14/2017   Imbalance 06/24/2017   Tremor 06/24/2017   ICD (implantable cardioverter-defibrillator), biventricular, 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   Carcinoma of overlapping sites of right breast in female, estrogen receptor positive 06/16/2016   Acute exacerbation of CHF (congestive heart failure) 06/07/2016   A-fib 04/08/2016   COPD exacerbation 03/22/2016   Acute bronchitis 03/22/2016   Atrial fibrillation with RVR 03/22/2016   Chronic renal insufficiency 03/22/2016   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/30/2016   Cough with expectoration 06/04/2015   Essential thrombocytosis 02/01/2015   Anemia 02/01/2015   Atrial flutter ([place]) 01/23/2015   Atrial fibrillation 01/23/2015   Other long term (current) drug therapy 11/06/2014   High risk medication use 11/06/2014   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   Severe mitral insufficiency 08/01/2014   Long term current use of anticoagulant 06/06/2014   S/P mitral valve repair 06/06/2014   Anxiety 05/13/2014   Chronic obstructive pulmonary disease 03/22/2014   Moderate episode of recurrent major depressive disorder 03/22/2014   Primary biliary cholangitis 02/19/2014   Avitaminosis D 02/19/2014   GERD (gastroesophageal reflux disease) 02/19/2014    Palliative Care Assessment & Plan    Assessment/Recommendations/Plan  Patient looks to be declining towards end of life TOC order for referral to hospice On discharge, would recommend scripts  for: - Morphine Concentrate 10mg /0.89ml: 5mg  (0.7ml) sublingual every 1 hour as needed for pain or shortness of breath: Disp 63ml - Lorazepam 2mg /ml concentrated solution: 1mg  (0.24ml) sublingual every 4 hours as needed for anxiety: Disp 36ml -  Haldol 2mg /ml solution: 0.5mg  (0.19ml) sublingual every 4 hours as needed for agitation or nausea: Disp 4ml     Code Status: DNR  Prognosis:  < 3 months  Discharge Planning: Home with Hospice  Care plan was discussed with family and care team.   Thank you for allowing the Palliative Medicine Team to assist in the care of this patient.  Greater than 50%  of this time was spent counseling and coordinating care related to the above assessment and plan.  Mariana Kaufman, AGNP-C Palliative Medicine   Please contact Palliative Medicine Team phone at [phone] for questions and concerns.

## 2022-12-24 ENCOUNTER — Inpatient Hospital Stay: Payer: Medicare Other

## 2022-12-24 DIAGNOSIS — F0390 Unspecified dementia without behavioral disturbance: Secondary | ICD-10-CM | POA: Diagnosis not present

## 2022-12-24 DIAGNOSIS — G9341 Metabolic encephalopathy: Secondary | ICD-10-CM | POA: Diagnosis not present

## 2022-12-24 DIAGNOSIS — J189 Pneumonia, unspecified organism: Secondary | ICD-10-CM | POA: Diagnosis not present

## 2022-12-24 DIAGNOSIS — N3001 Acute cystitis with hematuria: Secondary | ICD-10-CM | POA: Diagnosis not present

## 2022-12-24 MED ORDER — FUROSEMIDE 10 MG/ML IJ SOLN: 40.0000 mg | Freq: Once | INTRAMUSCULAR | Status: DC

## 2022-12-24 MED ORDER — FUROSEMIDE 20 MG PO TABS
20.0000 mg | ORAL_TABLET | Freq: Every day | ORAL | Status: DC
  Filled 2022-12-24 (×2): qty 1

## 2022-12-24 MED ORDER — IPRATROPIUM-ALBUTEROL 0.5-2.5 (3) MG/3ML IN SOLN
3.0000 mL | Freq: Two times a day (BID) | RESPIRATORY_TRACT | Status: DC
  Administered 2022-12-24 – 2022-12-25 (×2): 3 mL via RESPIRATORY_TRACT
  Filled 2022-12-24 (×2): qty 3

## 2022-12-24 NOTE — Progress Notes (Signed)
PROGRESS NOTE    Adriana Martin  [id number] DOB: 01-01-1946 DOA: 12/18/2022 PCP: Sallee Lange, NP    Brief Narrative:  77 y.o. female with medical history significant of HFpEF with last EF of 65-70%, mitral valve replacement, atrial fibrillation on Eliquis, COPD, chronic hypoxic respiratory failure on 2 L, hypertension, CKD stage III, hyperlipidemia, hypothyroidism, breast cancer on tamoxifen, MGUS, frequent UTIs who presents to the ED due to altered mental status.  History obtained through chart review due to patient's altered mental status.   Per chart review, [facility], increasing agitation and disorientation, and possible urinary tract infection. Staff at SNF noted that patient was having some shortness of breath that is why chest x-ray was obtained.  Patient was not on her home oxygen.  Urinalysis at the center demonstrated possible urinary tract infection with elevated WBC in the urine in addition to an AKI with creatinine of 2.6.  Lasix was not administered due to AKI.  They note that patient is bedbound at rest.   Patient is able to tell me her name, but otherwise is unable to answer any other questions.   Collateral history obtained from patient's daughters.  Patient has been sitting more frequently in her own stool and due to this has led to some general irritation.  They are worried that this may have led to the UTI.  In addition, patient has been having some diarrhea.  They were unaware of any shortness of breath.   3/30.  Patient opens her eyes but does not talk much.  Placed on a diet by speech therapy.  Niece at the bedside not sure if they want to go back to rehab or take home.  Has not walked at rehab. 3/31.  Patient more alert today following commands and more talkative today.  Resistance profile on the Klebsiella in the urine culture.  Has ESBL.  Discontinue Rocephin and start meropenem.  Likely will  switch over to fosfomycin upon disposition. 4/1.  IV fluids discontinued and a dose of IV Lasix given for patient's shortness of breath.  Chest x-ray read as rightly improved pulmonary edema. 4/2.  Creatinine down to 1.48.  Continue meropenem today. 4/3: Per RN patient lost IV access.  Only available oral option would be Macrobid.  Discussed with pharmacy.  Will start medication today. 4/4: Plan discharge home with hospice services.  Hospice liaison engaged.  Pending delivery of DME.  Anticipated discharge 4/5.   Assessment & Plan:   Principal Problem:   Acute metabolic encephalopathy Active Problems:   Acute cystitis with hematuria   Multifocal pneumonia   Acute kidney injury superimposed on CKD   Chronic diastolic CHF (congestive heart failure)   PAD (peripheral artery disease)   Primary biliary cholangitis   Atrial flutter (HCC)   Dementia   Essential hypertension   COPD (chronic obstructive pulmonary disease)   Breast cancer   ESBL (extended spectrum beta-lactamase) producing bacteria infection   Acute metabolic encephalopathy Mental status appears waxing and waning.  On my evaluation this morning patient arouses and answers questions however speech often muffled and patient remains very lethargic.  Continue UTI treatment with Macrobid  Acute cystitis with hematuria ESBL klebsiella growing out of urine culture.  Received meropenem previously.  Was initially switching to fosfomycin however discussed with pharmacy and this antibiotic has poor penetrance for ESBL infections.  Will use Macrobid per sensitivities.  Plan for no more than 5-day course.  Multifocal pneumonia Seen on CT scan.  Received Rocephin then subsequently meropenem.  Completed course of Zithromax.  Now on Macrobid as above.  Acute kidney injury superimposed on CKD Acute kidney injury on CKD stage IIIa.  Suspect intravascular volume depletion, multifactorial in etiology.  Creatinine improving.  No IV fluids    Chronic diastolic CHF (congestive heart failure) Elevated BNP, last echocardiogram showed EF normal range.  Stopped IV fluid on 12/21/2022.  Given 1 dose of IV Lasix on 12/21/2022.  Continue oral Lasix started on 12/22/2022.   PAD (peripheral artery disease) ABI came back okay.  No intervention.  Discontinue Lovenox and placed back on Eliquis on 4/1.   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   Atrial flutter ([place]) Placed back on Eliquis.   Dementia - Delirium precautions - Continue home Memantine   Essential hypertension Hold home Lisinopril in the setting of AKI    COPD (chronic obstructive pulmonary disease) - Continue home bronchodilators.  - Continue home supplemental oxygen at 2L   Breast cancer - Continue home Tamoxifen  Goals of care Appreciate involvement of palliative care and hospice liaison.  Discussed with family.  Plan to discharge home with hospice services.  Anticipated date of discharge 4/5 after delivery of home DME.  Patient remains DNR.  Poor prognosis.   DVT prophylaxis: Apixaban Code Status: DNR Family Communication: None today Disposition Plan: Status is: Inpatient Remains inpatient appropriate because: ESBL UTI on antibiotics.  Acute metabolic encephalopathy.   Level of care: Telemetry Medical  Consultants:  Palliative care  Procedures:  None  Antimicrobials: Macrobid   Subjective: Seen and examined.  Labored breathing  Objective: Vitals:   12/24/22 0500 12/24/22 0508 12/24/22 0720 12/24/22 0746  BP:  (!) 120/105  (!) 137/99  Pulse:  83  74  Resp:    16  Temp:  98.8 F (37.1 C)  (!) 97.4 F (36.3 C)  TempSrc:      SpO2:  99% 99% 100%  Weight: 68.3 kg     Height:        Intake/Output Summary (Last 24 hours) at 12/24/2022 1447 Last data filed at 12/24/2022 1340 Gross per 24 hour  Intake 240 ml  Output 400 ml  Net -160 ml   Filed Weights   12/20/22 0500 12/23/22 0500 12/24/22 0500   Weight: 70.1 kg 69.6 kg 68.3 kg    Examination:  General exam: Lethargic.  Appears ill Respiratory system: Bilateral scattered crackles.  No wheeze.  Use of accessory muscles noted.  2 L Cardiovascular system: S1-S2, RRR, no murmurs, no pedal edema Gastrointestinal system: Soft, NT/ND, normal bowel sounds Central nervous system: Lethargic.  Oriented x 1 Extremities: Symmetrically decreased lower bilateral lower extremities.  Gait not assessed Skin: No rashes, lesions or ulcers Psychiatry: Judgement and insight appear impaired. Mood & affect flattened.     Data Reviewed: I have personally reviewed following labs and imaging studies  CBC: Recent Labs  Lab 12/18/22 1038 12/19/22 0618 12/20/22 1222 12/21/22 0628 12/22/22 0729 12/23/22 0752  WBC 14.4* 13.6* 15.1* 13.1* 15.2* 15.2*  NEUTROABS 13.2* 11.7*  --   --   --   --   HGB 11.1* 11.0* 10.9* 10.9* 10.6* 11.1*  HCT 38.7 36.7 37.2 36.9 35.2* 37.1  MCV 93.7 89.5 91.0 90.9 88.9 87.7  PLT 178 164 132* 131* 142* [id number]*   Basic Metabolic Panel: Recent Labs  Lab 12/18/22 1658 12/19/22 0618 12/20/22 1222 12/21/22 [id number] 12/22/22 0729 12/23/22 [id number]  NA 144 146* 144  --  141 140  K 4.8 4.7 4.0  --  3.7 3.7  CL 104 109 107  --  106 104  CO2 24 23 26   --  25 27  GLUCOSE 128* 86 116*  --  89 107*  BUN 47* 47* 47*  --  40* 36*  CREATININE 2.20* 2.18* 1.95* 1.69* 1.48* 1.39*  CALCIUM 8.9 8.7* 8.6*  --  8.6* 8.8*   GFR: Estimated Creatinine Clearance: 30.7 mL/min (A) (by C-G formula based on SCr of 1.39 mg/dL (H)). Liver Function Tests: Recent Labs  Lab 12/18/22 1038 12/19/22 0618  AST 109* 76*  ALT 125* 101*  ALKPHOS 68 67  BILITOT 1.2 0.9  PROT 6.4* 6.3*  ALBUMIN 2.7* 2.7*   No results for input(s): "LIPASE", "AMYLASE" in the last 168 hours. No results for input(s): "AMMONIA" in the last 168 hours. Coagulation Profile: Recent Labs  Lab 12/18/22 1658  INR 2.3*   Cardiac Enzymes: No results for input(s):  "CKTOTAL", "CKMB", "CKMBINDEX", "TROPONINI" in the last 168 hours. BNP (last 3 results) No results for input(s): "PROBNP" in the last 8760 hours. HbA1C: No results for input(s): "HGBA1C" in the last 72 hours. CBG: No results for input(s): "GLUCAP" in the last 168 hours. Lipid Profile: No results for input(s): "CHOL", "HDL", "LDLCALC", "TRIG", "CHOLHDL", "LDLDIRECT" in the last 72 hours. Thyroid Function Tests: No results for input(s): "TSH", "T4TOTAL", "FREET4", "T3FREE", "THYROIDAB" in the last 72 hours. Anemia Panel: No results for input(s): "VITAMINB12", "FOLATE", "FERRITIN", "TIBC", "IRON", "RETICCTPCT" in the last 72 hours. Sepsis Labs: Recent Labs  Lab 12/18/22 1550  LATICACIDVEN 2.3*    Recent Results (from the past 240 hour(s))  Urine Culture     Status: Abnormal   Collection Time: 12/18/22  2:05 PM   Specimen: Urine, Random  Result Value Ref Range Status   Specimen Description   Final    URINE, RANDOM Performed at [facility], [address] [place], [place] [address]    Special Requests   Final    NONE Performed at [facility], [address] [place], [place] [address]    Culture (A)  Final    >=100,000 COLONIES/mL KLEBSIELLA PNEUMONIAE Confirmed Extended Spectrum Beta-Lactamase Producer (ESBL).  In bloodstream infections from ESBL organisms, carbapenems are preferred over piperacillin/tazobactam. They are shown to have a lower risk of mortality.    Report Status 12/20/2022 FINAL  Final   Organism ID, Bacteria KLEBSIELLA PNEUMONIAE (A)  Final      Susceptibility   Klebsiella pneumoniae - MIC*    AMPICILLIN >=32 RESISTANT Resistant     CEFAZOLIN >=64 RESISTANT Resistant     CEFEPIME >=32 RESISTANT Resistant     CEFTRIAXONE >=64 RESISTANT Resistant     CIPROFLOXACIN 0.5 INTERMEDIATE Intermediate     GENTAMICIN <=1 SENSITIVE Sensitive     IMIPENEM 0.5 SENSITIVE Sensitive     NITROFURANTOIN 32 SENSITIVE Sensitive     TRIMETH/SULFA >=320  RESISTANT Resistant     AMPICILLIN/SULBACTAM >=32 RESISTANT Resistant     PIP/TAZO <=4 SENSITIVE Sensitive     * >=100,000 COLONIES/mL KLEBSIELLA PNEUMONIAE         Radiology Studies: DG Chest Port 1 View  Result Date: 12/24/2022 CLINICAL DATA:  [id number] Hypoxia [id number] EXAM: PORTABLE CHEST - 1 VIEW COMPARISON:  12/21/2022 FINDINGS: Cardiac silhouette is prominent. There is pulmonary interstitial prominence with vascular congestion. No focal consolidation. No pneumothorax or pleural effusion identified. Aorta calcified. There is a prosthetic mitral  valve. There is a left-sided pacer. There is a right shoulder prosthesis. IMPRESSION: Findings suggest CHF.   Diffuse pneumonia cannot be excluded. Electronically Signed   By: Sammie Bench M.D.   On: 12/24/2022 09:16        Scheduled Meds:  apixaban  5 mg Oral BID   fluticasone furoate-vilanterol  1 puff Inhalation Daily   furosemide  40 mg Intravenous Once   [START ON 12/25/2022] furosemide  20 mg Oral Daily   ipratropium-albuterol  3 mL Nebulization BID   levothyroxine  50 mcg Oral QAC breakfast   memantine  5 mg Oral BID   nitrofurantoin (macrocrystal-monohydrate)  100 mg Oral Q12H   pantoprazole  40 mg Oral BID   predniSONE  5 mg Oral Q breakfast   scopolamine  1 patch Transdermal Q72H   sodium chloride flush  3 mL Intravenous Q12H   traZODone  25 mg Oral QHS   ursodiol  600 mg Oral BID   venlafaxine XR  75 mg Oral Daily   Continuous Infusions:   LOS: 5 days     Sidney Ace, MD [facility]   If 7PM-7AM, please contact night-coverage  12/24/2022, 2:47 PM

## 2022-12-24 NOTE — Progress Notes (Signed)
Manufacturing engineer [facility] Liaison Note  Received request from attending provider/Dr. Michelene Gardener for hospice services at home after discharge. Transitions of Care Manager, [place], notified via [employer]. Chart and patient information under review by [facility] physician.   Spoke with daughter/[name] to initiate education related to hospice philosophy, services, and team approach to care. Adriana Martin  verbalized understanding of information given. Per discussion, the plan is for patient to discharge home via EMS once cleared to DC.    DME needs discussed. Patient has the following equipment in the home: O2 w/ [employer] Patient requests the following equipment for delivery: Bed [employer] verified and is correct in the chart. Adriana Martin is the family member to contact to arrange time of equipment delivery.    Please send signed and completed DNR home with patient/family. Please provide prescriptions at discharge as needed to ensure ongoing symptom management.    [facility] information and contact numbers given to family & above information shared with TOC.   Please call with any questions/concerns.    Thank you for the opportunity to participate in this patient's care.   Phillis Haggis, MSW [facility] Liaison  [phone]

## 2022-12-24 NOTE — Progress Notes (Signed)
Daily Progress Note   [name]       Date: 12/24/2022 DOB: 1946/03/30  Age: 77 y.o. MRN#: [id number] Attending Physician: Sidney Ace, MD Primary Care Physician: Sallee Lange, NP Admit Date: 12/18/2022  Reason for Consultation/Follow-up: Establishing goals of care  Patient Profile/HPI:  77 y.o. female  with past medical history of dementia, HFpEF 65-70%, a fib on Eliquis, COPD, chronic resp failure, HTN, CKD III, HLD, hypothyroidism, MGUS, breast cancer, frequent UTIs  admitted on 12/18/2022 with altered mental status. Workup reveals UTI. She had recent admission and dc'd to SNF. Palliative medicine consulted for [place].   Subjective: Chart reviewed including labs, progress notes, imaging from this and previous encounters.  Daughter [name] is at bedside.  Patient is awake and alert. Speech is unintelligible.  Appears comfortable.  Working towards discharge home with hospice.   Review of Systems  Unable to perform ROS: Mental status change     Physical Exam Vitals and nursing note reviewed.  Constitutional:      Comments: lethargic  Cardiovascular:     Rate and Rhythm: Normal rate.  Pulmonary:     Comments: Intermittent cough            Vital Signs: BP (!) 137/99 (BP Location: Left Arm)   Pulse 74   Temp (!) 97.4 F (36.3 C)   Resp 16   Ht 5\' 2"  (1.575 m)   Wt 68.3 kg   SpO2 100%   BMI 27.54 kg/m  SpO2: SpO2: 100 % O2 Device: O2 Device: Nasal Cannula O2 Flow Rate: O2 Flow Rate (L/min): 2 L/min  Intake/output summary:  Intake/Output Summary (Last 24 hours) at 12/24/2022 1443 Last data filed at 12/24/2022 1340 Gross per 24 hour  Intake 240 ml  Output 400 ml  Net -160 ml    LBM: Last BM Date : 12/23/22 Baseline Weight: Weight:  67.1 kg Most recent weight: Weight: 68.3 kg       Palliative Assessment/Data: PPS: 20%      Patient Active Problem List   Diagnosis Date Noted  . ESBL (extended spectrum beta-lactamase) producing bacteria infection 12/20/2022  . Acute metabolic encephalopathy [id number]  . Acute cystitis with hematuria 12/19/2022  . Multifocal pneumonia 12/19/2022  . Acute kidney injury superimposed on CKD 12/19/2022  . PAD (peripheral artery  disease) 12/18/2022  . Myocardial injury 11/30/2022  . Chronic kidney disease, stage 3a 11/30/2022  . Hypothyroidism 11/30/2022  . Diarrhea 01/12/2022  . Severe sepsis 01/10/2022  . Laceration of left forearm 01/10/2022  . Severe sepsis with acute organ dysfunction 01/09/2022  . Weakness   . Acute respiratory failure with hypoxia   . Cardiac arrest   . Epigastric pain   . CHF exacerbation 04/19/2021  . Colitis 04/05/2021  . Acute colitis 04/04/2021  . HTN (hypertension) 04/04/2021  . Atrial fibrillation, chronic 04/04/2021  . Chronic diastolic CHF (congestive heart failure) 04/04/2021  . Rectal bleeding 04/04/2021  . COPD (chronic obstructive pulmonary disease) 04/04/2021  . HLD (hyperlipidemia) 04/04/2021  . Hypothyroid 04/04/2021  . Cirrhosis 04/04/2021  . Breast cancer 04/04/2021  . Dementia 04/04/2021  . History of radiation therapy 04/02/2021  . 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 Senile purpura 10/30/2020  . Acquired thrombophilia 06/13/2020  . Essential hypertension 03/14/2020  . Hyperlipidemia 03/14/2020  . Acute ischemic stroke ([place]) L MCA s/p mechanical thrombectomy, embolic d/t AF not on [facility] 03/10/2020  . History of stroke 03/10/2020  . Osteopenia 01/21/2020  . Family history of breast cancer   . Family history of multiple myeloma   . Family history of esophageal cancer   . Displaced comminuted fracture of shaft of right humerus 12/05/2018  . Recurrent falls 12/05/2018  . 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/2018  .  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 Imbalance 06/24/2017  . Tremor 06/24/2017  . ICD (implantable cardioverter-defibrillator), biventricular, 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  . Carcinoma of overlapping sites of right breast in female, estrogen receptor positive 06/16/2016  . Acute exacerbation of CHF (congestive heart failure) 06/07/2016  . A-fib 04/08/2016  . COPD exacerbation 03/22/2016  . Acute bronchitis 03/22/2016  . Atrial fibrillation with RVR 03/22/2016  . Chronic renal insufficiency 03/22/2016  . 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/30/2016  . Cough with expectoration 06/04/2015  . Essential thrombocytosis 02/01/2015  . Anemia 02/01/2015  . Atrial flutter ([place]) 01/23/2015  . Atrial fibrillation 01/23/2015  . Other long term (current) drug therapy 11/06/2014  . High risk medication use 11/06/2014  . 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 Severe mitral insufficiency 08/01/2014  . Long term current use of anticoagulant 06/06/2014  . S/P mitral valve repair 06/06/2014  . Anxiety 05/13/2014  . Chronic obstructive pulmonary disease 03/22/2014  . Moderate episode of recurrent major depressive disorder 03/22/2014  . Primary biliary cholangitis 02/19/2014  . Avitaminosis D 02/19/2014  . GERD (gastroesophageal reflux disease) 02/19/2014    Palliative Care Assessment & Plan    Assessment/Recommendations/Plan  Continue current plan for discharge home with hospice support On discharge, would recommend scripts for: - Morphine Concentrate 10mg /0.25ml: 5mg  (0.36ml) sublingual every 1 hour as needed for pain or shortness of breath: Disp 17ml - Lorazepam 2mg /ml concentrated solution: 1mg  (0.32ml) sublingual every 4 hours as needed for anxiety: Disp 67ml - Haldol 2mg /ml solution: 0.5mg  (0.62ml) sublingual every 4 hours as needed for agitation or nausea: Disp 32ml     Code Status: DNR  Prognosis:  < 3 months  Discharge Planning: Home with  Hospice  Care plan was discussed with family and care team.   Thank you for allowing the Palliative Medicine Team to assist in the care of this patient.  Greater than 50%  of this time was spent counseling and coordinating care related to the above assessment and plan.  Mariana Kaufman, AGNP-C Palliative Medicine  Please contact Palliative Medicine Team phone at [phone] for questions and concerns.

## 2022-12-24 NOTE — Progress Notes (Signed)
Daily Progress Note   [name]       Date: 12/24/2022 DOB: 01-Nov-1945  Age: 77 y.o. MRN#: [id number] Attending Physician: Sidney Ace, MD Primary Care Physician: Sallee Lange, NP Admit Date: 12/18/2022  Reason for Consultation/Follow-up: Establishing goals of care  Patient Profile/HPI:  77 y.o. female  with past medical history of dementia, HFpEF 65-70%, a fib on Eliquis, COPD, chronic resp failure, HTN, CKD III, HLD, hypothyroidism, MGUS, breast cancer, frequent UTIs  admitted on 12/18/2022 with altered mental status. Workup reveals UTI. She had recent admission and dc'd to SNF. Palliative medicine consulted for [place].   Subjective: Chart reviewed including labs, progress notes, imaging from this and previous encounters.  Daughter [name] is at bedside.  Patient is awake and alert. Speech is unintelligible.  Appears comfortable.  Working towards discharge home with hospice.   Review of Systems  Unable to perform ROS: Mental status change     Physical Exam Vitals and nursing note reviewed.  Constitutional:      Comments: lethargic  Cardiovascular:     Rate and Rhythm: Normal rate.  Pulmonary:     Comments: Intermittent cough            Vital Signs: BP (!) 137/99 (BP Location: Left Arm)   Pulse 74   Temp (!) 97.4 F (36.3 C)   Resp 16   Ht 5\' 2"  (1.575 m)   Wt 68.3 kg   SpO2 100%   BMI 27.54 kg/m  SpO2: SpO2: 100 % O2 Device: O2 Device: Nasal Cannula O2 Flow Rate: O2 Flow Rate (L/min): 2 L/min  Intake/output summary:  Intake/Output Summary (Last 24 hours) at 12/24/2022 1532 Last data filed at 12/24/2022 1340 Gross per 24 hour  Intake 240 ml  Output 400 ml  Net -160 ml    LBM: Last BM Date : 12/23/22 Baseline Weight: Weight:  67.1 kg Most recent weight: Weight: 68.3 kg       Palliative Assessment/Data: PPS: 20%      Patient Active Problem List   Diagnosis Date Noted   ESBL (extended spectrum beta-lactamase) producing bacteria infection 99991111   Acute metabolic encephalopathy [id number]   Acute cystitis with hematuria 12/19/2022   Multifocal pneumonia 12/19/2022   Acute kidney injury superimposed on CKD 12/19/2022   PAD (peripheral artery  disease) 12/18/2022   Myocardial injury 11/30/2022   Chronic kidney disease, stage 3a 11/30/2022   Hypothyroidism 11/30/2022   Diarrhea 01/12/2022   Severe sepsis 01/10/2022   Laceration of left forearm 01/10/2022   Severe sepsis with acute organ dysfunction 01/09/2022   Weakness    Acute respiratory failure with hypoxia    Cardiac arrest    Epigastric pain    CHF exacerbation 04/19/2021   Colitis 04/05/2021   Acute colitis 04/04/2021   HTN (hypertension) 04/04/2021   Atrial fibrillation, chronic 04/04/2021   Chronic diastolic CHF (congestive heart failure) 04/04/2021   Rectal bleeding 04/04/2021   COPD (chronic obstructive pulmonary disease) 04/04/2021   HLD (hyperlipidemia) 04/04/2021   Hypothyroid 04/04/2021   Cirrhosis 04/04/2021   Breast cancer 04/04/2021   Dementia 04/04/2021   History of radiation therapy 04/02/2021   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   Senile purpura 10/30/2020   Acquired thrombophilia 06/13/2020   Essential hypertension 03/14/2020   Hyperlipidemia 03/14/2020   Acute ischemic stroke ([place]) L MCA s/p mechanical thrombectomy, embolic d/t AF not on [facility] 03/10/2020   History of stroke 03/10/2020   Osteopenia 01/21/2020   Family history of breast cancer    Family history of multiple myeloma    Family history of esophageal cancer    Displaced comminuted fracture of shaft of right humerus 12/05/2018   Recurrent falls 12/05/2018   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/2018   MGUS (monoclonal gammopathy of unknown  significance) 12/14/2017   Imbalance 06/24/2017   Tremor 06/24/2017   ICD (implantable cardioverter-defibrillator), biventricular, 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   Carcinoma of overlapping sites of right breast in female, estrogen receptor positive 06/16/2016   Acute exacerbation of CHF (congestive heart failure) 06/07/2016   A-fib 04/08/2016   COPD exacerbation 03/22/2016   Acute bronchitis 03/22/2016   Atrial fibrillation with RVR 03/22/2016   Chronic renal insufficiency 03/22/2016   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/30/2016   Cough with expectoration 06/04/2015   Essential thrombocytosis 02/01/2015   Anemia 02/01/2015   Atrial flutter ([place]) 01/23/2015   Atrial fibrillation 01/23/2015   Other long term (current) drug therapy 11/06/2014   High risk medication use 11/06/2014   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   Severe mitral insufficiency 08/01/2014   Long term current use of anticoagulant 06/06/2014   S/P mitral valve repair 06/06/2014   Anxiety 05/13/2014   Chronic obstructive pulmonary disease 03/22/2014   Moderate episode of recurrent major depressive disorder 03/22/2014   Primary biliary cholangitis 02/19/2014   Avitaminosis D 02/19/2014   GERD (gastroesophageal reflux disease) 02/19/2014    Palliative Care Assessment & Plan    Assessment/Recommendations/Plan  Continue current plan for discharge home with hospice support On discharge, would recommend scripts for: - Morphine Concentrate 10mg /0.39ml: 5mg  (0.67ml) sublingual every 1 hour as needed for pain or shortness of breath: Disp 66ml - Lorazepam 2mg /ml concentrated solution: 1mg  (0.101ml) sublingual every 4 hours as needed for anxiety: Disp 54ml - Haldol 2mg /ml solution: 0.5mg  (0.21ml) sublingual every 4 hours as needed for agitation or nausea: Disp 31ml     Code Status: DNR  Prognosis:  < 3 months  Discharge Planning: Home with Hospice  Care plan was discussed with family and care team.   Thank  you for allowing the Palliative Medicine Team to assist in the care of this patient.  Greater than 50%  of this time was spent counseling and coordinating care related to the above assessment and plan.  Mariana Kaufman, AGNP-C Palliative Medicine  Please contact Palliative Medicine Team phone at [phone] for questions and concerns.

## 2022-12-24 NOTE — Care Management Important Message (Signed)
Important Message  Patient Details  Name: Lyndall Fava MRN: [id number] Date of Birth: 08/04/1946   Medicare Important Message Given:  Other (see comment)  Disposition to discharge with hospice services.  Medicare IM withheld at this time out of respect for patient and family.     Dannette Barbara 12/24/2022, 12:53 PM

## 2022-12-25 DIAGNOSIS — G9341 Metabolic encephalopathy: Secondary | ICD-10-CM | POA: Diagnosis not present

## 2022-12-25 DIAGNOSIS — Z7189 Other specified counseling: Secondary | ICD-10-CM | POA: Diagnosis not present

## 2022-12-25 MED ORDER — MORPHINE SULFATE (CONCENTRATE) 10 MG/0.5ML PO SOLN
10.0000 mg | ORAL | Status: DC | PRN
  Administered 2022-12-25: 10 mg via SUBLINGUAL
  Filled 2022-12-25: qty 0.5

## 2022-12-25 MED ORDER — NITROFURANTOIN MONOHYD MACRO 100 MG PO CAPS: 100.0000 mg | ORAL_CAPSULE | Freq: Two times a day (BID) | ORAL | 0 refills | Status: DC

## 2022-12-25 MED ORDER — MORPHINE SULFATE (CONCENTRATE) 10 MG/0.5ML PO SOLN: 10.0000 mg | ORAL | Status: DC | PRN

## 2022-12-25 MED ORDER — MORPHINE SULFATE (CONCENTRATE) 10 MG /0.5 ML PO SOLN: 10.0000 mg | ORAL | 0 refills | Status: DC | PRN

## 2022-12-25 MED ORDER — LORAZEPAM 2 MG/ML PO CONC: 1.0000 mg | ORAL | 0 refills | Status: DC | PRN

## 2022-12-25 MED ORDER — LORAZEPAM 2 MG/ML PO CONC: 1.0000 mg | ORAL | Status: DC | PRN

## 2022-12-25 MED ORDER — FUROSEMIDE 20 MG PO TABS: 20.0000 mg | ORAL_TABLET | Freq: Every day | ORAL | 0 refills | Status: DC

## 2022-12-25 MED ORDER — LORAZEPAM 2 MG/ML PO CONC
2.0000 mg | ORAL | Status: DC | PRN
  Administered 2022-12-25: 2 mg via ORAL
  Filled 2022-12-25: qty 1

## 2022-12-25 MED ORDER — HALOPERIDOL LACTATE 2 MG/ML PO CONC: 0.5000 mg | ORAL | 0 refills | Status: DC | PRN

## 2022-12-25 MED ORDER — MORPHINE SULFATE (CONCENTRATE) 10 MG /0.5 ML PO SOLN: 10.0000 mg | ORAL | 0 refills | Status: AC | PRN

## 2022-12-25 MED ORDER — HALOPERIDOL LACTATE 2 MG/ML PO CONC: 0.6000 mg | ORAL | 0 refills | Status: DC | PRN

## 2022-12-25 MED ORDER — NITROFURANTOIN 25 MG/5ML PO SUSP: 50.0000 mg | Freq: Four times a day (QID) | ORAL | 0 refills | Status: AC

## 2022-12-25 NOTE — Progress Notes (Signed)
Daily Progress Note   [name]       Date: 12/25/2022 DOB: 11-Aug-1946  Age: 77 y.o. MRN#: [id number] Attending Physician: Tresa Moore, MD Primary Care Physician: Myrene Buddy, NP Admit Date: 12/18/2022  Reason for Consultation/Follow-up: Establishing goals of care  Subjective: Notes reviewed.  Notified by attending and team that there are questions and concerns with symptom management here in the hospital and at home.  Patient is not eating or drinking this morning or taking medications.  Entered room with pharmacist Will Dareen Piano.  Patient is resting in bed and appears to be otherwise comfortable with some congested breathing noted.  Patient's caregiver is at bedside.  She discusses concerns that the family has.  She called patient's daughter who is H POA on the phone.  Discussed patient's status and care moving forward.  Questions answered and information provided.  Pharmacist was able to determine that oral Lasix may be given sublingually, and family is amenable to this.  Discussed her oral antibiotic being changed to a liquid formulation.  Discussed risk and benefits given her mental status, days of use, and swallowing.  Daughter will ultimately determine use of this medication.  Length of Stay: 6  Current Medications: Scheduled Meds:   apixaban  5 mg Oral BID   fluticasone furoate-vilanterol  1 puff Inhalation Daily   furosemide  40 mg Intravenous Once   furosemide  20 mg Oral Daily   ipratropium-albuterol  3 mL Nebulization BID   levothyroxine  50 mcg Oral QAC breakfast   memantine  5 mg Oral BID   nitrofurantoin (macrocrystal-monohydrate)  100 mg Oral Q12H   pantoprazole  40 mg Oral BID   predniSONE  5 mg Oral Q breakfast   scopolamine  1  patch Transdermal Q72H   sodium chloride flush  3 mL Intravenous Q12H   traZODone  25 mg Oral QHS   ursodiol  600 mg Oral BID   venlafaxine XR  75 mg Oral Daily    Continuous Infusions:   PRN Meds: acetaminophen **OR** acetaminophen, albuterol, guaiFENesin-dextromethorphan, morphine CONCENTRATE, ondansetron **OR** ondansetron (ZOFRAN) IV, polyethylene glycol  Physical Exam Constitutional:      Comments: Maintained eyes closed  Pulmonary:     Comments: Coughed intermittently.  Vital Signs: BP (!) 82/66   Pulse 61   Temp 98 F (36.7 C)   Resp 18   Ht 5\' 2"  (1.575 m)   Wt 68.3 kg   SpO2 96%   BMI 27.54 kg/m  SpO2: SpO2: 96 % O2 Device: O2 Device: Nasal Cannula O2 Flow Rate: O2 Flow Rate (L/min): 3 L/min  Intake/output summary:  Intake/Output Summary (Last 24 hours) at 12/25/2022 1234 Last data filed at 12/24/2022 1900 Gross per 24 hour  Intake 480 ml  Output --  Net 480 ml   LBM: Last BM Date : 12/24/22 Baseline Weight: Weight: 67.1 kg Most recent weight: Weight: 68.3 kg        Patient Active Problem List   Diagnosis Date Noted   ESBL (extended spectrum beta-lactamase) producing bacteria infection 12/20/2022   Acute metabolic encephalopathy 12/19/2022   Acute cystitis with hematuria 12/19/2022   Multifocal pneumonia 12/19/2022   Acute kidney injury superimposed on CKD 12/19/2022   PAD (peripheral artery disease) 12/18/2022   Myocardial injury 11/30/2022   Chronic kidney disease, stage 3a 11/30/2022   Hypothyroidism 11/30/2022   Diarrhea 01/12/2022   Severe sepsis 01/10/2022   Laceration of left forearm 01/10/2022   Severe sepsis with acute organ dysfunction 01/09/2022   Weakness    Acute respiratory failure with hypoxia    Cardiac arrest    Epigastric pain    CHF exacerbation 04/19/2021   Colitis 04/05/2021   Acute colitis 04/04/2021   HTN (hypertension) 04/04/2021   Atrial fibrillation, chronic 04/04/2021   Chronic diastolic CHF (congestive  heart failure) 04/04/2021   Rectal bleeding 04/04/2021   COPD (chronic obstructive pulmonary disease) 04/04/2021   HLD (hyperlipidemia) 04/04/2021   Hypothyroid 04/04/2021   Cirrhosis 04/04/2021   Breast cancer 04/04/2021   Dementia 04/04/2021   History of radiation therapy 04/02/2021   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   Senile purpura 10/30/2020   Acquired thrombophilia 06/13/2020   Essential hypertension 03/14/2020   Hyperlipidemia 03/14/2020   Acute ischemic stroke ([facility]) L MCA s/p mechanical thrombectomy, embolic d/t AF not on [facility] 03/10/2020   History of stroke 03/10/2020   Osteopenia 01/21/2020   Family history of breast cancer    Family history of multiple myeloma    Family history of esophageal cancer    Displaced comminuted fracture of shaft of right humerus 12/05/2018   Recurrent falls 12/05/2018   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/2018   MGUS (monoclonal gammopathy of unknown significance) 12/14/2017   Imbalance 06/24/2017   Tremor 06/24/2017   ICD (implantable cardioverter-defibrillator), biventricular, 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   Carcinoma of overlapping sites of right breast in female, estrogen receptor positive 06/16/2016   Acute exacerbation of CHF (congestive heart failure) 06/07/2016   A-fib 04/08/2016   COPD exacerbation 03/22/2016   Acute bronchitis 03/22/2016   Atrial fibrillation with RVR 03/22/2016   Chronic renal insufficiency 03/22/2016   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/30/2016   Cough with expectoration 06/04/2015   Essential thrombocytosis 02/01/2015   Anemia 02/01/2015   Atrial flutter ([facility]) 01/23/2015   Atrial fibrillation 01/23/2015   Other long term (current) drug therapy 11/06/2014   High risk medication use 11/06/2014   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  Please see previous PMT note for comfort medications.   Code Status:    Code Status Orders  (From admission, onward)           Start     Ordered   12/18/22 1452  Do not attempt resuscitation (DNR)  Continuous       Question Answer Comment  If patient has no pulse and is not breathing Do Not Attempt Resuscitation   If patient has a pulse and/or is breathing: Medical Treatment Goals LIMITED ADDITIONAL INTERVENTIONS: Use medication/IV fluids and cardiac monitoring as indicated; Do not use intubation or mechanical ventilation (DNI), also provide comfort medications.  Transfer to Progressive/Stepdown as indicated, avoid Intensive Care.   Consent: Discussion documented in EHR or advanced directives reviewed      12/18/22 1452           Code Status History     Date Active Date Inactive Code Status Order ID Comments User Context   12/18/2022 1450 12/18/2022 1452 DNR [id number]  Verdene LennertBasaraba, Iulia, MD ED   11/30/2022 0959 12/04/2022 2228 DNR [id number]  Chesley NoonJessup, Charles, MD ED   01/09/2022 1946 01/14/2022 2254 DNR [id number]  Cox, Amy N, DO ED   01/09/2022 1426 01/09/2022 1946 DNR [id number]  Minna AntisPaduchowski, Kevin, MD Outpatient   04/19/2021 2356 04/28/2021 1741 Full Code [id number]  Synetta FailMelvin, Alexander B, MD ED   04/04/2021 1329 04/07/2021 2301 Full Code [id number]  Lorretta HarpNiu, Xilin, MD ED   03/10/2020 1753 03/15/2020 2314 Full Code [id number]  Milon DikesArora, Ashish, MD Inpatient   01/07/2018 1305 01/08/2018 1741 Full Code [id number]  Signa KellPatel, Sunny, MD Inpatient   06/07/2016 0710 06/07/2016 0937 Full Code [id number]  Lewie Loronukov, Magadalene S, NP ED   03/22/2016 1232 03/24/2016 1432 Full Code [id number]  Katharina CaperVaickute, Rima, MD  Inpatient   05/02/2015 1443 05/03/2015 2003 Full Code [id number]  Altamese DillingVachhani, Vaibhavkumar, MD Inpatient       Prognosis:  < 4 weeks   Care plan was discussed with attending and team members via epic chat  Thank you for allowing the Palliative Medicine Team to assist in the care of this patient.   Morton Stallrystal Margurette Brener, NP  Please contact Palliative Medicine Team phone at [phone] for questions and concerns.

## 2022-12-25 NOTE — TOC Transition Note (Signed)
Transition of Care [facility]) - CM/SW Discharge Note   Patient Details  Name: Shanila Fien MRN: [id number] Date of Birth: 11/30/1945  Transition of [facility]) CM/SW Contact:  Allena Katz, LCSW Phone Number: 12/25/2022, 10:13 AM   Clinical Narrative:  Pt discharging today with home hospice. Watt Climes with [facility] has confirmed DME, Hospital bed and [facility] lift has been delivered. Oxygen had been provided via adapt. Medical necessity printed to unit. CSW to call [facility] once discharge summary is in.              Patient Goals and CMS Choice      Discharge Placement                         Discharge Plan and Services Additional resources added to the After Visit Summary for                                       Social Determinants of Health (SDOH) Interventions SDOH Screenings   Food Insecurity: No Food Insecurity (12/18/2022)  Housing: Low Risk  (12/18/2022)  Transportation Needs: No Transportation Needs (12/18/2022)  Utilities: Not At Risk (12/18/2022)  Depression (PHQ2-9): Low Risk  (10/17/2021)  Tobacco Use: Low Risk  (12/18/2022)     Readmission Risk Interventions    01/14/2022   10:57 AM 04/06/2021   10:09 AM  Readmission Risk Prevention Plan  Transportation Screening Complete Complete  PCP or Specialist Appt within 3-5 Days  Complete  HRI or Home Care Consult  Complete  Social Work Consult for Recovery Care Planning/Counseling  Complete  Palliative Care Screening  Complete  Medication Review [employer]) Complete Complete  HRI or Home Care Consult Complete   SW Recovery Care/Counseling Consult Complete   Palliative Care Screening Not Applicable   Skilled Nursing Facility Not Applicable

## 2022-12-25 NOTE — Discharge Summary (Addendum)
Physician Discharge Summary  Adriana Martin ZOX:[id number] DOB: May 29, 1946 DOA: 12/18/2022  PCP: Myrene Buddy, NP  Admit date: 12/18/2022 Discharge date: 12/25/2022  Admitted From: Home Disposition:  Home with hospice  Recommendations for Outpatient Follow-up:  Follow up with hospice providers   Home Health:No Equipment/Devices:None   Discharge Condition:Hospice  CODE STATUS:DNR  Diet recommendation: Puree/comfort  Brief/Interim Summary: 77 y.o. female with medical history significant of HFpEF with last EF of 65-70%, mitral valve replacement, atrial fibrillation on Eliquis, COPD, chronic hypoxic respiratory failure on 2 L, hypertension, CKD stage III, hyperlipidemia, hypothyroidism, breast cancer on tamoxifen, MGUS, frequent UTIs who presents to the ED due to altered mental status.  History obtained through chart review due to patient's altered mental status.   Per chart review, [facility], increasing agitation and disorientation, and possible urinary tract infection. Staff at SNF noted that patient was having some shortness of breath that is why chest x-ray was obtained.  Patient was not on her home oxygen.  Urinalysis at the center demonstrated possible urinary tract infection with elevated WBC in the urine in addition to an AKI with creatinine of 2.6.  Lasix was not administered due to AKI.  They note that patient is bedbound at rest.   Patient is able to tell me her name, but otherwise is unable to answer any other questions.   Collateral history obtained from patient's daughters.  Patient has been sitting more frequently in her own stool and due to this has led to some general irritation.  They are worried that this may have led to the UTI.  In addition, patient has been having some diarrhea.  They were unaware of any shortness of breath.   3/30.  Patient opens her eyes but does not talk much.  Placed on a diet by  speech therapy.  Niece at the bedside not sure if they want to go back to rehab or take home.  Has not walked at rehab. 3/31.  Patient more alert today following commands and more talkative today.  Resistance profile on the Klebsiella in the urine culture.  Has ESBL.  Discontinue Rocephin and start meropenem.  Likely will switch over to fosfomycin upon disposition. 4/1.  IV fluids discontinued and a dose of IV Lasix given for patient's shortness of breath.  Chest x-ray read as rightly improved pulmonary edema. 4/2.  Creatinine down to 1.48.  Continue meropenem today. 4/3: Per RN patient lost IV access.  Only available oral option would be Macrobid.  Discussed with pharmacy.  Will start medication today. 4/4: Plan discharge home with hospice services.  Hospice liaison engaged.  Pending delivery of DME.  Anticipated discharge 4/5.  Discharge Diagnoses:  Principal Problem:   Acute metabolic encephalopathy Active Problems:   Acute cystitis with hematuria   Multifocal pneumonia   Acute kidney injury superimposed on CKD   Chronic diastolic CHF (congestive heart failure)   PAD (peripheral artery disease)   Primary biliary cholangitis   Atrial flutter (HCC)   Dementia   Essential hypertension   COPD (chronic obstructive pulmonary disease)   Breast cancer   ESBL (extended spectrum beta-lactamase) producing bacteria infection  Acute metabolic encephalopathy Mental status appears waxing and waning.  On my evaluation this morning patient arouses and answers questions however speech often muffled and patient remains very lethargic.  Continue UTI treatment with Macrobid.  Prescribed on discharge   Acute cystitis with hematuria ESBL klebsiella growing out of  urine culture.  Received meropenem previously.  Was initially switching to fosfomycin however discussed with pharmacy and this antibiotic has poor penetrance for ESBL infections.  Will use Macrobid per sensitivities.  Plan for no more than 5-day  course.     Multifocal pneumonia Seen on CT scan.  Received Rocephin then subsequently meropenem.  Completed course of Zithromax.  Now on Macrobid as above.   Acute kidney injury superimposed on CKD Acute kidney injury on CKD stage IIIa.  Suspect intravascular volume depletion, multifactorial in etiology.  Creatinine improving.  Appropriate for discharge home with hospice services   Chronic diastolic CHF (congestive heart failure) Elevated BNP, last echocardiogram showed EF normal range.  Stopped IV fluid on 12/21/2022.  Given 1 dose of IV Lasix on 12/21/2022.  Continue oral Lasix started on 12/22/2022.   PAD (peripheral artery disease) ABI came back okay.  No intervention.  Discontinue Lovenox and placed back on Eliquis on 4/1.   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   Atrial flutter (HCC) Placed back on Eliquis.   Dementia - Delirium precautions - Continue home Memantine   Essential hypertension Hold home Lisinopril in the setting of AKI    COPD (chronic obstructive pulmonary disease) - Continue home bronchodilators.  - Continue home supplemental oxygen at 2L   Breast cancer - Continue home Tamoxifen   Goals of care Appreciate involvement of palliative care and hospice liaison.  Discussed with family.  Plan to discharge home with hospice services.  Anticipated date of discharge 4/5 after delivery of home DME.  Patient remains DNR.  Poor overall prognosis  Discharge Instructions  Discharge Instructions     Diet - low sodium heart healthy   Complete by: As directed    Increase activity slowly   Complete by: As directed       Allergies as of 12/25/2022       Reactions   Atorvastatin Shortness Of Breath   Tolerates pravastatin   Calcium Shortness Of Breath   Fluoxetine    Other reaction(s): Unknown   Hydrochlorothiazide    Other reaction(s): Unknown        Medication List     STOP taking these medications     Acetaminophen Extra Strength 500 MG Caps   ezetimibe 10 MG tablet Commonly known as: ZETIA   levothyroxine 50 MCG tablet Commonly known as: SYNTHROID   lisinopril 5 MG tablet Commonly known as: ZESTRIL   Magnesium 200 MG Tabs   melatonin 5 MG Tabs   multivitamin tablet   potassium chloride 10 MEQ tablet Commonly known as: KLOR-CON   pravastatin 40 MG tablet Commonly known as: PRAVACHOL   predniSONE 5 MG tablet Commonly known as: DELTASONE   tamoxifen 20 MG tablet Commonly known as: NOLVADEX       TAKE these medications    Advair Diskus 100-50 MCG/ACT Aepb Generic drug: fluticasone-salmeterol Inhale 1 puff into the lungs 2 (two) times daily.   Eliquis 5 MG Tabs tablet Generic drug: apixaban Take 5 mg by mouth 2 (two) times daily.   feeding supplement Liqd Take 237 mLs by mouth 2 (two) times daily between meals.   furosemide 20 MG tablet Commonly known as: LASIX Take 1 tablet (20 mg total) by mouth daily.   haloperidol 2 MG/ML solution Commonly known as: HALDOL Take 0.3 mLs (0.6 mg total) by mouth every 4 (four) hours as needed for agitation (nausea).   ipratropium-albuterol 0.5-2.5 (3) MG/3ML Soln Commonly known as: DUONEB  Take 3 mLs by nebulization 3 (three) times daily.   LORazepam 2 MG/ML concentrated solution Commonly known as: ATIVAN Take 0.5 mLs (1 mg total) by mouth every 4 (four) hours as needed for anxiety.   memantine 10 MG tablet Commonly known as: NAMENDA Take 5 mg by mouth 2 (two) times daily.   morphine CONCENTRATE 10 mg / 0.5 ml concentrated solution Place 0.5 mLs (10 mg total) under the tongue every 2 (two) hours as needed for up to 7 days for severe pain or shortness of breath.   nitrofurantoin 25 MG/5ML suspension Commonly known as: FURADANTIN Take 10 mLs (50 mg total) by mouth 4 (four) times daily for 3 days.   pantoprazole 40 MG tablet Commonly known as: PROTONIX Take 1 tablet (40 mg total) by mouth 2 (two) times  daily.   polyethylene glycol 17 g packet Commonly known as: MIRALAX / GLYCOLAX Take 17 g by mouth 2 (two) times daily. Dissolve 17 grams in 8 ounces of any liquid of pt's choice.   ursodiol 300 MG capsule Commonly known as: ACTIGALL Take 2 capsules by mouth 2 (two) times daily.   venlafaxine XR 37.5 MG 24 hr capsule Commonly known as: EFFEXOR-XR Take 75 mg by mouth daily.   Ventolin HFA 108 (90 Base) MCG/ACT inhaler Generic drug: albuterol Inhale 1 puff into the lungs every 4 (four) hours as needed.               Durable Medical Equipment  (From admission, onward)           Start     Ordered   12/22/22 1119  For home use only DME Other see comment  Once       Comments: Michiel Sites lift  Question:  Length of Need  Answer:  Lifetime   12/22/22 1118   12/22/22 1008  For home use only DME Hospital bed  Once       Question Answer Comment  Length of Need Lifetime   Patient has (list medical condition): bedbound   The above medical condition requires: Patient requires the ability to reposition frequently   Head must be elevated greater than: 30 degrees   Bed type Semi-electric   Hoyer Lift Yes   Support Surface: Gel Overlay      12/22/22 1007   12/21/22 1524  For home use only DME oxygen  Once       Question Answer Comment  Length of Need Lifetime   Mode or (Route) Nasal cannula   Liters per Minute 2   Frequency Continuous (stationary and portable oxygen unit needed)   Oxygen conserving device Yes   Oxygen delivery system Gas      12/21/22 1524            Allergies  Allergen Reactions   Atorvastatin Shortness Of Breath    Tolerates pravastatin   Calcium Shortness Of Breath   Fluoxetine     Other reaction(s): Unknown   Hydrochlorothiazide     Other reaction(s): Unknown    Consultations: Palliative care   Procedures/Studies: DG Chest Port 1 View  Result Date: 12/24/2022 CLINICAL DATA:  200808 Hypoxia [id number] EXAM: PORTABLE CHEST - 1 VIEW COMPARISON:   12/21/2022 FINDINGS: Cardiac silhouette is prominent. There is pulmonary interstitial prominence with vascular congestion. No focal consolidation. No pneumothorax or pleural effusion identified. Aorta calcified. There is a prosthetic mitral valve. There is a left-sided pacer. There is a right shoulder prosthesis. IMPRESSION: Findings suggest CHF.   Diffuse pneumonia cannot  be excluded. Electronically Signed   By: Joshua  Pleasure M.D.   On: 12/24/2022 09:16   DG Chest Port 1 View  Result Date: 12/21/2022 CLINICAL DATA:  Shortness of breath, acute metabolic encephalopathy EXAM: PORTABLE CHEST 1 VIEW COMPARISON:  Portable exam 1059 hours compared to 12/18/2022 FINDINGS: LEFT subclavian pacemaker leads project over RIGHT ventricle and coronary sinus. Enlargement of cardiac silhouette with pulmonary vascular congestion post MVR. Atherosclerotic calcification aorta. Mild pulmonary infiltrates likely pulmonary edema, slightly improved. Calcified nodule associated with the anterior RIGHT fifth rib unchanged. No pleural effusion or pneumothorax. Bones demineralized with reverse RIGHT shoulder arthroplasty noted. IMPRESSION: Slightly improved pulmonary edema. Electronically Signed   By: Mark  Boles M.D.   On: 12/21/2022 11:41   US ARTERIAL ABI (SCREENING LOWER EXTREMITY)  Result Date: 12/21/2022 CLINICAL DATA:  Peripheral arterial disease Right breast malignancy Hypertension Hyperlipidemia EXAM: NONINVASIVE PHYSIOLOGIC VASCULAR STUDY OF BILATERAL LOWER EXTREMITIES TECHNIQUE: Evaluation of both lower extremities were performed at rest, including calculation of ankle-brachial indices with single level pressure measurements and doppler recording. COMPARISON:  None available. FINDINGS: Right ABI:  1.11 Left ABI:  1.21 Right Lower Extremity: Posterior tibial and dorsalis pedis waveforms are monophasic. Left Lower Extremity: Posterior tibial and dorsalis pedis waveforms are monophasic. IMPRESSION: Although ankle-brachial  indices are within normal limits, monophasic waveforms seen bilaterally at the posterior tibial and dorsalis pedis arteries are suspicious for underlying arterial occlusive disease. This could be better evaluated with CT angiography of the lower extremities. Electronically Signed   By: Farhaan  Mir M.D.   On: 12/21/2022 09:36   CT RENAL STONE STUDY  Result Date: 12/18/2022 CLINICAL DATA:  Urinary tract infection.  Acute kidney injury. EXAM: CT ABDOMEN AND PELVIS WITHOUT CONTRAST TECHNIQUE: Multidetector CT imaging of the abdomen and pelvis was performed following the standard protocol without IV contrast. RADIATION DOSE REDUCTION: This exam was performed according to the departmental dose-optimization program which includes automated exposure control, adjustment of the mA and/or kV according to patient size and/or use of iterative reconstruction technique. COMPARISON:  /11/24 CT scan FINDINGS: Lower chest: Small bilateral pleural effusions, possible loculation in the left major fissure. Prominent cardiomegaly. Mitral valve prosthesis. Pacer leads not substantially changed. Mild aortic valve calcification. Descending thoracic aortic atherosclerotic vascular calcification. Patchy ground-glass opacities in both lower lobes, somewhat obscured by motion artifact, cannot exclude alveolitis or edema. Hepatobiliary: Focal steatosis along the falciform ligament. Gallbladder borderline distended but otherwise unremarkable. Common bile duct borderline prominent at 0.8 cm. Pancreas: Unremarkable Spleen: Unremarkable Adrenals/Urinary Tract: No significant renal or adrenal findings. Urinary bladder wall thickness appears normal for the degree of distension. Stomach/Bowel: Unremarkable Vascular/Lymphatic: Atherosclerosis is present, including aortoiliac atherosclerotic disease. Reproductive: Uterus absent.  Adnexa unremarkable. Other: No supplemental non-categorized findings. Musculoskeletal: No change in the prior compression  fractures at T8 and L1. stable appearance of bilateral rib deformities from previous fractures. IMPRESSION: 1. Small bilateral pleural effusions, possible loculation in the left major fissure. 2. Patchy ground-glass opacities in both lower lobes, somewhat obscured by motion artifact, cannot exclude alveolitis or edema. 3. Prominent cardiomegaly. 4. Old compression fractures at T8 and L1. Old bilateral rib deformities. 5. Aortic and systemic atherosclerosis. Aortic Atherosclerosis (ICD10-I70.0). Electronically Signed   By: Walter  Liebkemann M.D.   On: 12/18/2022 14:43   CT HEAD WO CONTRAST (<MEASUREMEN T>)  Result Date: 12/18/2022 CLINICAL DATA:  Provided history: Memory loss. EXAM: CT HEAD WITHOUT CONTRAST TECHNIQUE: Contiguous axial images were obtained from the base of the skull through the vertex without intravenous contrast.  RADIATION DOSE REDUCTION: This exam was performed according to the departmental dose-optimization program which includes automated exposure control, adjustment of the mA and/or kV according to patient size and/or use of iterative reconstruction technique. COMPARISON:  Head CT 11/30/2022. FINDINGS: Brain: Moderate to advanced generalized cerebral atrophy. Mild cerebellar atrophy. Redemonstrated chronic cortical/subcortical infarcts within posterior left insula, posterior left temporal lobe, left parietal lobe and left occipital lobe. Redemonstrated small chronic cortically based infarct within the right middle frontal gyrus (series 2, image 25). Background moderate patchy and ill-defined hypoattenuation within the cerebral white matter, nonspecific but compatible with chronic small vessel ischemic disease. Redemonstrated small chronic infarct within the inferior right cerebellar hemisphere. There is no acute intracranial hemorrhage. No acute demarcated cortical infarct. No extra-axial fluid collection. No evidence of an intracranial mass. No midline shift. Vascular: No hyperdense vessel.  Atherosclerotic calcifications. Skull: No fracture or aggressive osseous lesion. Sinuses/Orbits: No mass or acute finding within the imaged orbits. Small mucous retention cyst within the right sphenoid sinus. IMPRESSION: 1.  No evidence of an acute intracranial abnormality. 2. Parenchymal atrophy, chronic small vessel ischemic disease and chronic infarcts as described. 3. Small mucous retention cyst within the right sphenoid sinus. Electronically Signed   By: Jackey Loge D.O.   On: 12/18/2022 12:23   DG Chest Portable 1 View  Result Date: 12/18/2022 CLINICAL DATA:  Shortness of breath EXAM: PORTABLE CHEST 1 VIEW COMPARISON:  X-ray 11/30/2022 FINDINGS: Enlarged cardiopericardial silhouette with calcified aorta. Left chest pacemaker. Prosthetic aortic valve. Diffuse vascular congestion. Tiny right effusion. Mild edema. No consolidation. Dense nodule once again at the right lung base. Right shoulder arthroplasty at the edge of the imaging field. Likely related to the right ribcage as per CT scan of 11/30/2022 abdomen pelvis. IMPRESSION: Enlarged heart with prosthetic aortic valve. Calcified aorta. Pacemaker. Vascular congestion and mild edema. Tiny right effusion. Electronically Signed   By: Karen Kays M.D.   On: 12/18/2022 11:07   CT RENAL STONE STUDY  Result Date: 11/30/2022 CLINICAL DATA:  Abdominal and flank pain suspected kidney stone, history chronic hypoxic respiratory failure, CHF, atrial fibrillation, hypertension, COPD, stroke EXAM: CT ABDOMEN AND PELVIS WITHOUT CONTRAST TECHNIQUE: Multidetector CT imaging of the abdomen and pelvis was performed following the standard protocol without IV contrast. RADIATION DOSE REDUCTION: This exam was performed according to the departmental dose-optimization program which includes automated exposure control, adjustment of the mA and/or kV according to patient size and/or use of iterative reconstruction technique. COMPARISON:  04/04/2021 FINDINGS: Lower chest:  Bibasilar effusions and atelectasis. Pacemaker leads RIGHT ventricle and coronary sinus. Post MVR. Hepatobiliary: Gallbladder and liver unremarkable Pancreas: Normal appearance Spleen: Normal appearance, within limitations of respiratory motion. Adrenals/Urinary Tract: Adrenal glands unremarkable. 12 mm RIGHT renal cyst unchanged. No follow-up imaging recommended. No additional renal mass, hydronephrosis, or hydroureter. Bladder unremarkable. Stomach/Bowel: Increased stool in rectum. Minimal sigmoid diverticulosis without evidence of diverticulitis. Stomach and bowel loops otherwise normal appearance. Appendix not visualized. Vascular/Lymphatic: Scattered pelvic phleboliths. Atherosclerotic calcifications aorta and iliac arteries without aneurysm. No adenopathy. Reproductive: Uterus surgically absent.  Unremarkable ovaries Other: No free air or free fluid. No inflammatory process or definite hernia. Musculoskeletal: Osseous demineralization. Chronic compression fractures T8 and L1 unchanged. IMPRESSION: Increased stool in rectum. Minimal sigmoid diverticulosis without evidence of diverticulitis. Bibasilar effusions and atelectasis. No acute intra-abdominal or intrapelvic abnormalities. Aortic Atherosclerosis (ICD10-I70.0). Electronically Signed   By: Ulyses Southward M.D.   On: 11/30/2022 16:21   CT HEAD WO CONTRAST ( )  Result Date: 11/30/2022 CLINICAL  DATA:  Mental status change, unknown cause EXAM: CT HEAD WITHOUT CONTRAST TECHNIQUE: Contiguous axial images were obtained from the base of the skull through the vertex without intravenous contrast. RADIATION DOSE REDUCTION: This exam was performed according to the departmental dose-optimization program which includes automated exposure control, adjustment of the mA and/or kV according to patient size and/or use of iterative reconstruction technique. COMPARISON:  CT head April 21, 23. FINDINGS: Brain: Similar appearance of a prior left parietal/temporal infarct.  Similar small remote right cerebellar infarct. No evidence of acute large vascular territory infarct, acute hemorrhage, mass lesion or midline shift. Patchy white matter hypodensities, compatible with chronic microvascular ischemic disease. Cerebral atrophy. Vascular: No hyperdense vessel identified. Skull: No acute fracture. Sinuses/Orbits: Clear sinuses.  No acute orbital findings. Other: No mastoid effusions. IMPRESSION: Similar appearance of a prior left parietal/temporal infarct. Electronically Signed   By: Feliberto Harts M.D.   On: 11/30/2022 16:20   DG Chest Port 1 View  Result Date: 11/30/2022 CLINICAL DATA:  Sepsis EXAM: PORTABLE CHEST 1 VIEW COMPARISON:  08/03/2022 FINDINGS: Cardiac silhouette is enlarged with increased bilateral interstitial opacities favored to represent edema over pneumonia. Suspect small effusions bilaterally. No pneumothorax. Postop changes from left subclavian 2 lead pacer and cardiac valve. Aorta atherosclerotic. Remote right shoulder arthroplasty noted. Stable right lower lung calcified granuloma. IMPRESSION: Cardiomegaly with diffuse interstitial edema pattern and small effusions. Electronically Signed   By: Judie Petit.  Shick M.D.   On: 11/30/2022 10:44      Subjective: Seen and examined on day of discharge.  Caregiver at bedside.  Appropriate for discharge home hospice services.  Discharge Exam: Vitals:   12/25/22 0746 12/25/22 0936  BP:  (!) 82/66  Pulse:  61  Resp:    Temp:  98 F (36.7 C)  SpO2: 90% 96%   Vitals:   12/24/22 2045 12/25/22 0444 12/25/22 0746 12/25/22 0936  BP:  120/62  (!) 82/66  Pulse:  64  61  Resp:  18    Temp:  97.9 F (36.6 C)  98 F (36.7 C)  TempSrc:      SpO2: 99% 99% 90% 96%  Weight:      Height:        General: Pt is alert, awake, not in acute distress Cardiovascular: RRR, S1/S2 +, no rubs, no gallops Respiratory: CTA bilaterally, no wheezing, no rhonchi Abdominal: Soft, NT, ND, bowel sounds + Extremities: no edema, no  cyanosis    The results of significant diagnostics from this hospitalization (including imaging, microbiology, ancillary and laboratory) are listed below for reference.     Microbiology: Recent Results (from the past 240 hour(s))  Urine Culture     Status: Abnormal   Collection Time: 12/18/22  2:05 PM   Specimen: Urine, Random  Result Value Ref Range Status   Specimen Description   Final    URINE, RANDOM Performed at [facility], [address] [place], [place] [address]    Special Requests   Final    NONE Performed at [facility], [address] [place], [place] [address]    Culture (A)  Final    >=100,000 COLONIES/mL KLEBSIELLA PNEUMONIAE Confirmed Extended Spectrum Beta-Lactamase Producer (ESBL).  In bloodstream infections from ESBL organisms, carbapenems are preferred over piperacillin/tazobactam. They are shown to have a lower risk of mortality.    Report Status 12/20/2022 FINAL  Final   Organism ID, Bacteria KLEBSIELLA PNEUMONIAE (A)  Final      Susceptibility   Klebsiella pneumoniae -  MIC*    AMPICILLIN >=32 RESISTANT Resistant     CEFAZOLIN >=64 RESISTANT Resistant     CEFEPIME >=32 RESISTANT Resistant     CEFTRIAXONE >=64 RESISTANT Resistant     CIPROFLOXACIN 0.5 INTERMEDIATE Intermediate     GENTAMICIN <=1 SENSITIVE Sensitive     IMIPENEM 0.5 SENSITIVE Sensitive     NITROFURANTOIN 32 SENSITIVE Sensitive     TRIMETH/SULFA >=320 RESISTANT Resistant     AMPICILLIN/SULBACTAM >=32 RESISTANT Resistant     PIP/TAZO <=4 SENSITIVE Sensitive     * >=100,000 COLONIES/mL KLEBSIELLA PNEUMONIAE     Labs: BNP (last 3 results) Recent Labs    01/09/22 2025 11/30/22 1230 12/18/22 1038  BNP 150.5* 1,044.5* 926.7*   Basic Metabolic Panel: Recent Labs  Lab 12/18/22 1658 12/19/22 0618 12/20/22 1222 12/21/22 0628 12/22/22 0729 12/23/22 0752  NA 144 146* 144  --  141 140  K 4.8 4.7 4.0  --  3.7 3.7  CL 104 109 107  --  106 104  CO2 24 23 26    --  25 27  GLUCOSE 128* 86 116*  --  89 107*  BUN 47* 47* 47*  --  40* 36*  CREATININE 2.20* 2.18* 1.95* 1.69* 1.48* 1.39*  CALCIUM 8.9 8.7* 8.6*  --  8.6* 8.8*   Liver Function Tests: Recent Labs  Lab 12/19/22 0618  AST 76*  ALT 101*  ALKPHOS 67  BILITOT 0.9  PROT 6.3*  ALBUMIN 2.7*   No results for input(s): "LIPASE", "AMYLASE" in the last 168 hours. No results for input(s): "AMMONIA" in the last 168 hours. CBC: Recent Labs  Lab 12/19/22 0618 12/20/22 1222 12/21/22 0628 12/22/22 0729 12/23/22 0752  WBC 13.6* 15.1* 13.1* 15.2* 15.2*  NEUTROABS 11.7*  --   --   --   --   HGB 11.0* 10.9* 10.9* 10.6* 11.1*  HCT 36.7 37.2 36.9 35.2* 37.1  MCV 89.5 91.0 90.9 88.9 87.7  PLT 164 132* 131* 142* 149*   Cardiac Enzymes: No results for input(s): "CKTOTAL", "CKMB", "CKMBINDEX", "TROPONINI" in the last 168 hours. BNP: Invalid input(s): "POCBNP" CBG: No results for input(s): "GLUCAP" in the last 168 hours. D-Dimer No results for input(s): "DDIMER" in the last 72 hours. Hgb A1c No results for input(s): "HGBA1C" in the last 72 hours. Lipid Profile No results for input(s): "CHOL", "HDL", "LDLCALC", "TRIG", "CHOLHDL", "LDLDIRECT" in the last 72 hours. Thyroid function studies No results for input(s): "TSH", "T4TOTAL", "T3FREE", "THYROIDAB" in the last 72 hours.  Invalid input(s): "FREET3" Anemia work up No results for input(s): "VITAMINB12", "FOLATE", "FERRITIN", "TIBC", "IRON", "RETICCTPCT" in the last 72 hours. Urinalysis    Component Value Date/Time   COLORURINE YELLOW (A) 12/18/2022 1405   APPEARANCEUR CLOUDY (A) 12/18/2022 1405   APPEARANCEUR Clear 06/09/2013 2122   LABSPEC 1.019 12/18/2022 1405   LABSPEC 1.004 06/09/2013 2122   PHURINE 5.0 12/18/2022 1405   GLUCOSEU NEGATIVE 12/18/2022 1405   GLUCOSEU Negative 06/09/2013 2122   HGBUR MODERATE (A) 12/18/2022 1405   BILIRUBINUR NEGATIVE 12/18/2022 1405   BILIRUBINUR Negative 06/09/2013 2122   KETONESUR NEGATIVE  12/18/2022 1405   PROTEINUR 30 (A) 12/18/2022 1405   NITRITE NEGATIVE 12/18/2022 1405   LEUKOCYTESUR LARGE (A) 12/18/2022 1405   LEUKOCYTESUR Negative 06/09/2013 2122   Sepsis Labs Recent Labs  Lab 12/20/22 1222 12/21/22 0628 12/22/22 0729 12/23/22 0752  WBC 15.1* 13.1* 15.2* 15.2*   Microbiology Recent Results (from the past 240 hour(s))  Urine Culture     Status: Abnormal  Collection Time: 12/18/22  2:05 PM   Specimen: Urine, Random  Result Value Ref Range Status   Specimen Description   Final    URINE, RANDOM Performed at [facility], [address], [place], [place] [address]    Special Requests   Final    NONE Performed at [facility], [address] [place], [place] [address]    Culture (A)  Final    >=100,000 COLONIES/mL KLEBSIELLA PNEUMONIAE Confirmed Extended Spectrum Beta-Lactamase Producer (ESBL).  In bloodstream infections from ESBL organisms, carbapenems are preferred over piperacillin/tazobactam. They are shown to have a lower risk of mortality.    Report Status 12/20/2022 FINAL  Final   Organism ID, Bacteria KLEBSIELLA PNEUMONIAE (A)  Final      Susceptibility   Klebsiella pneumoniae - MIC*    AMPICILLIN >=32 RESISTANT Resistant     CEFAZOLIN >=64 RESISTANT Resistant     CEFEPIME >=32 RESISTANT Resistant     CEFTRIAXONE >=64 RESISTANT Resistant     CIPROFLOXACIN 0.5 INTERMEDIATE Intermediate     GENTAMICIN <=1 SENSITIVE Sensitive     IMIPENEM 0.5 SENSITIVE Sensitive     NITROFURANTOIN 32 SENSITIVE Sensitive     TRIMETH/SULFA >=320 RESISTANT Resistant     AMPICILLIN/SULBACTAM >=32 RESISTANT Resistant     PIP/TAZO <=4 SENSITIVE Sensitive     * >=100,000 COLONIES/mL KLEBSIELLA PNEUMONIAE     Time coordinating discharge: Over 30 minutes  SIGNED:   Tresa Moore, MD  [facility] 12/25/2022, 12:29 PM Pager   If 7PM-7AM, please contact night-coverage

## 2023-01-20 DEATH — deceased

## 2023-03-01 ENCOUNTER — Other Ambulatory Visit: Payer: Medicare Other

## 2023-03-01 ENCOUNTER — Ambulatory Visit: Payer: Medicare Other | Admitting: Oncology

## 2023-03-19 ENCOUNTER — Encounter (INDEPENDENT_AMBULATORY_CARE_PROVIDER_SITE_OTHER): Payer: Medicare Other

## 2023-03-19 ENCOUNTER — Ambulatory Visit (INDEPENDENT_AMBULATORY_CARE_PROVIDER_SITE_OTHER): Payer: Medicare Other | Admitting: Nurse Practitioner
